# Patient Record
Sex: Female | Born: 1950 | Race: White | Hispanic: No | Marital: Married | Smoking: Current every day smoker
Health system: Southern US, Community
[De-identification: ages and names within clinical notes are randomized; demographics above are authoritative.]

## PROBLEM LIST (undated history)

## (undated) ENCOUNTER — Emergency Department (HOSPITAL_COMMUNITY): Payer: Medicare Other | Source: Home / Self Care

## (undated) DIAGNOSIS — F5089 Other specified eating disorder: Secondary | ICD-10-CM

## (undated) DIAGNOSIS — E785 Hyperlipidemia, unspecified: Secondary | ICD-10-CM

## (undated) DIAGNOSIS — M21372 Foot drop, left foot: Secondary | ICD-10-CM

## (undated) DIAGNOSIS — C801 Malignant (primary) neoplasm, unspecified: Secondary | ICD-10-CM

## (undated) DIAGNOSIS — M1990 Unspecified osteoarthritis, unspecified site: Secondary | ICD-10-CM

## (undated) DIAGNOSIS — I10 Essential (primary) hypertension: Secondary | ICD-10-CM

## (undated) DIAGNOSIS — J449 Chronic obstructive pulmonary disease, unspecified: Secondary | ICD-10-CM

## (undated) DIAGNOSIS — F329 Major depressive disorder, single episode, unspecified: Secondary | ICD-10-CM

## (undated) DIAGNOSIS — G629 Polyneuropathy, unspecified: Secondary | ICD-10-CM

## (undated) DIAGNOSIS — M109 Gout, unspecified: Secondary | ICD-10-CM

## (undated) DIAGNOSIS — G5702 Lesion of sciatic nerve, left lower limb: Secondary | ICD-10-CM

## (undated) DIAGNOSIS — Z720 Tobacco use: Secondary | ICD-10-CM

## (undated) DIAGNOSIS — M5090 Cervical disc disorder, unspecified, unspecified cervical region: Secondary | ICD-10-CM

## (undated) DIAGNOSIS — F32A Depression, unspecified: Secondary | ICD-10-CM

## (undated) DIAGNOSIS — I2510 Atherosclerotic heart disease of native coronary artery without angina pectoris: Secondary | ICD-10-CM

## (undated) HISTORY — DX: Atherosclerotic heart disease of native coronary artery without angina pectoris: I25.10

## (undated) HISTORY — PX: ABDOMINAL HYSTERECTOMY: SHX81

## (undated) HISTORY — DX: Unspecified osteoarthritis, unspecified site: M19.90

## (undated) HISTORY — DX: Foot drop, left foot: M21.372

## (undated) HISTORY — DX: Other specified eating disorder: F50.89

## (undated) HISTORY — PX: BLADDER SURGERY: SHX569

## (undated) HISTORY — DX: Polyneuropathy, unspecified: G62.9

## (undated) HISTORY — DX: Gout, unspecified: M10.9

## (undated) HISTORY — DX: Tobacco use: Z72.0

## (undated) HISTORY — DX: Major depressive disorder, single episode, unspecified: F32.9

## (undated) HISTORY — PX: LOBECTOMY: SHX5089

## (undated) HISTORY — DX: Hyperlipidemia, unspecified: E78.5

## (undated) HISTORY — DX: Cervical disc disorder, unspecified, unspecified cervical region: M50.90

## (undated) HISTORY — DX: Chronic obstructive pulmonary disease, unspecified: J44.9

## (undated) HISTORY — DX: Depression, unspecified: F32.A

## (undated) HISTORY — DX: Essential (primary) hypertension: I10

## (undated) HISTORY — PX: CHOLECYSTECTOMY: SHX55

---
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
## 1997-08-31 ENCOUNTER — Other Ambulatory Visit: Admission: RE | Admit: 1997-08-31 | Discharge: 1997-08-31 | Payer: Self-pay | Admitting: Obstetrics and Gynecology

## 1997-10-06 ENCOUNTER — Emergency Department (HOSPITAL_COMMUNITY): Admission: EM | Admit: 1997-10-06 | Discharge: 1997-10-06 | Payer: Self-pay

## 1998-03-12 ENCOUNTER — Observation Stay (HOSPITAL_COMMUNITY): Admission: AD | Admit: 1998-03-12 | Discharge: 1998-03-13 | Payer: Self-pay | Admitting: Cardiology

## 1998-08-08 ENCOUNTER — Other Ambulatory Visit: Admission: RE | Admit: 1998-08-08 | Discharge: 1998-08-08 | Payer: Self-pay | Admitting: Obstetrics and Gynecology

## 1998-08-28 ENCOUNTER — Ambulatory Visit (HOSPITAL_COMMUNITY): Admission: RE | Admit: 1998-08-28 | Discharge: 1998-08-28 | Payer: Self-pay | Admitting: Gastroenterology

## 1998-09-20 ENCOUNTER — Encounter: Payer: Self-pay | Admitting: Cardiology

## 1998-09-20 ENCOUNTER — Inpatient Hospital Stay (HOSPITAL_COMMUNITY): Admission: EM | Admit: 1998-09-20 | Discharge: 1998-09-25 | Payer: Self-pay | Admitting: Emergency Medicine

## 1998-09-24 ENCOUNTER — Encounter: Payer: Self-pay | Admitting: Cardiology

## 1998-11-01 ENCOUNTER — Ambulatory Visit (HOSPITAL_COMMUNITY): Admission: RE | Admit: 1998-11-01 | Discharge: 1998-11-01 | Payer: Self-pay | Admitting: Neurosurgery

## 1998-11-01 ENCOUNTER — Encounter: Payer: Self-pay | Admitting: Neurosurgery

## 1998-11-26 ENCOUNTER — Inpatient Hospital Stay (HOSPITAL_COMMUNITY): Admission: RE | Admit: 1998-11-26 | Discharge: 1998-11-27 | Payer: Self-pay | Admitting: Obstetrics and Gynecology

## 1998-11-26 ENCOUNTER — Encounter (INDEPENDENT_AMBULATORY_CARE_PROVIDER_SITE_OTHER): Payer: Self-pay | Admitting: Specialist

## 1999-12-23 ENCOUNTER — Encounter: Admission: RE | Admit: 1999-12-23 | Discharge: 2000-03-22 | Payer: Self-pay | Admitting: Anesthesiology

## 1999-12-24 ENCOUNTER — Encounter: Admission: RE | Admit: 1999-12-24 | Discharge: 2000-03-23 | Payer: Self-pay | Admitting: Anesthesiology

## 2000-01-28 ENCOUNTER — Ambulatory Visit (HOSPITAL_COMMUNITY): Admission: RE | Admit: 2000-01-28 | Discharge: 2000-01-28 | Payer: Self-pay | Admitting: Obstetrics and Gynecology

## 2000-01-29 ENCOUNTER — Encounter: Payer: Self-pay | Admitting: Obstetrics and Gynecology

## 2000-04-08 ENCOUNTER — Encounter: Admission: RE | Admit: 2000-04-08 | Discharge: 2000-07-07 | Payer: Self-pay | Admitting: Anesthesiology

## 2000-07-06 ENCOUNTER — Encounter: Admission: RE | Admit: 2000-07-06 | Discharge: 2000-10-04 | Payer: Self-pay | Admitting: Anesthesiology

## 2000-10-12 ENCOUNTER — Encounter: Admission: RE | Admit: 2000-10-12 | Discharge: 2000-11-27 | Payer: Self-pay | Admitting: Anesthesiology

## 2001-01-06 ENCOUNTER — Ambulatory Visit (HOSPITAL_COMMUNITY): Admission: RE | Admit: 2001-01-06 | Discharge: 2001-01-07 | Payer: Self-pay | Admitting: Cardiology

## 2001-01-06 HISTORY — PX: CARDIAC CATHETERIZATION: SHX172

## 2001-03-24 ENCOUNTER — Ambulatory Visit (HOSPITAL_COMMUNITY): Admission: RE | Admit: 2001-03-24 | Discharge: 2001-03-24 | Payer: Self-pay | Admitting: Cardiology

## 2001-03-24 HISTORY — PX: CARDIAC CATHETERIZATION: SHX172

## 2001-04-29 ENCOUNTER — Ambulatory Visit (HOSPITAL_COMMUNITY): Admission: RE | Admit: 2001-04-29 | Discharge: 2001-04-29 | Payer: Self-pay | Admitting: Gastroenterology

## 2001-05-11 ENCOUNTER — Ambulatory Visit (HOSPITAL_COMMUNITY): Admission: RE | Admit: 2001-05-11 | Discharge: 2001-05-11 | Payer: Self-pay | Admitting: Gastroenterology

## 2001-05-11 ENCOUNTER — Encounter: Payer: Self-pay | Admitting: Gastroenterology

## 2001-05-16 ENCOUNTER — Encounter: Payer: Self-pay | Admitting: Family Medicine

## 2001-05-16 ENCOUNTER — Encounter: Admission: RE | Admit: 2001-05-16 | Discharge: 2001-05-16 | Payer: Self-pay | Admitting: Family Medicine

## 2001-05-17 ENCOUNTER — Encounter: Payer: Self-pay | Admitting: Gastroenterology

## 2001-05-17 ENCOUNTER — Ambulatory Visit (HOSPITAL_COMMUNITY): Admission: RE | Admit: 2001-05-17 | Discharge: 2001-05-17 | Payer: Self-pay | Admitting: Gastroenterology

## 2001-06-07 ENCOUNTER — Encounter: Payer: Self-pay | Admitting: Surgery

## 2001-06-14 ENCOUNTER — Encounter (INDEPENDENT_AMBULATORY_CARE_PROVIDER_SITE_OTHER): Payer: Self-pay

## 2001-06-14 ENCOUNTER — Observation Stay (HOSPITAL_COMMUNITY): Admission: RE | Admit: 2001-06-14 | Discharge: 2001-06-15 | Payer: Self-pay | Admitting: Surgery

## 2002-06-14 ENCOUNTER — Inpatient Hospital Stay (HOSPITAL_COMMUNITY): Admission: AD | Admit: 2002-06-14 | Discharge: 2002-06-17 | Payer: Self-pay

## 2002-06-14 ENCOUNTER — Encounter: Payer: Self-pay | Admitting: Cardiology

## 2002-08-21 ENCOUNTER — Encounter: Admission: RE | Admit: 2002-08-21 | Discharge: 2002-11-19 | Payer: Self-pay | Admitting: Family Medicine

## 2003-01-31 ENCOUNTER — Encounter: Admission: RE | Admit: 2003-01-31 | Discharge: 2003-01-31 | Payer: Self-pay | Admitting: Family Medicine

## 2004-04-14 ENCOUNTER — Encounter (INDEPENDENT_AMBULATORY_CARE_PROVIDER_SITE_OTHER): Payer: Self-pay | Admitting: Specialist

## 2004-04-14 ENCOUNTER — Ambulatory Visit (HOSPITAL_COMMUNITY): Admission: RE | Admit: 2004-04-14 | Discharge: 2004-04-14 | Payer: Self-pay | Admitting: Gastroenterology

## 2006-12-15 ENCOUNTER — Emergency Department (HOSPITAL_COMMUNITY): Admission: EM | Admit: 2006-12-15 | Discharge: 2006-12-15 | Payer: Self-pay | Admitting: Emergency Medicine

## 2008-05-09 ENCOUNTER — Emergency Department (HOSPITAL_COMMUNITY): Admission: EM | Admit: 2008-05-09 | Discharge: 2008-05-09 | Payer: Self-pay | Admitting: Emergency Medicine

## 2008-09-24 ENCOUNTER — Encounter: Admission: RE | Admit: 2008-09-24 | Discharge: 2008-09-24 | Payer: Self-pay | Admitting: Family Medicine

## 2009-03-30 HISTORY — PX: CORONARY STENT PLACEMENT: SHX1402

## 2009-04-05 ENCOUNTER — Encounter: Admission: RE | Admit: 2009-04-05 | Discharge: 2009-04-05 | Payer: Self-pay | Admitting: Cardiology

## 2009-04-09 ENCOUNTER — Ambulatory Visit (HOSPITAL_COMMUNITY): Admission: AD | Admit: 2009-04-09 | Discharge: 2009-04-10 | Payer: Self-pay | Admitting: Cardiology

## 2009-04-09 HISTORY — PX: CARDIAC CATHETERIZATION: SHX172

## 2009-09-13 ENCOUNTER — Encounter: Admission: RE | Admit: 2009-09-13 | Discharge: 2009-09-13 | Payer: Self-pay | Admitting: Family Medicine

## 2009-09-18 ENCOUNTER — Encounter: Admission: RE | Admit: 2009-09-18 | Discharge: 2009-09-18 | Payer: Self-pay | Admitting: Family Medicine

## 2010-04-14 ENCOUNTER — Ambulatory Visit: Payer: Self-pay | Admitting: Cardiology

## 2010-04-20 ENCOUNTER — Encounter: Payer: Self-pay | Admitting: Family Medicine

## 2010-06-15 LAB — CBC
HCT: 38.6 % (ref 36.0–46.0)
Hemoglobin: 13.1 g/dL (ref 12.0–15.0)
MCHC: 34.1 g/dL (ref 30.0–36.0)
MCV: 96.8 fL (ref 78.0–100.0)
Platelets: 219 10*3/uL (ref 150–400)
RBC: 3.99 MIL/uL (ref 3.87–5.11)
RDW: 14.1 % (ref 11.5–15.5)
WBC: 7.6 10*3/uL (ref 4.0–10.5)

## 2010-06-15 LAB — BASIC METABOLIC PANEL
BUN: 24 mg/dL — ABNORMAL HIGH (ref 6–23)
CO2: 29 mEq/L (ref 19–32)
Calcium: 9.1 mg/dL (ref 8.4–10.5)
Chloride: 96 mEq/L (ref 96–112)
Creatinine, Ser: 1.13 mg/dL (ref 0.4–1.2)
GFR calc Af Amer: 60 mL/min — ABNORMAL LOW (ref >60)
GFR calc non Af Amer: 49 mL/min — ABNORMAL LOW (ref >60)
Glucose, Bld: 92 mg/dL (ref 70–99)
Potassium: 4.1 mEq/L (ref 3.5–5.1)
Sodium: 133 mEq/L — ABNORMAL LOW (ref 135–145)

## 2010-06-15 LAB — GLUCOSE, CAPILLARY: Glucose-Capillary: 95 mg/dL (ref 70–99)

## 2010-08-15 NOTE — Op Note (Signed)
[facility]  Patient:    Maria Arroyo, Maria Arroyo                   MRN: [id number] Proc. Date: 12/24/99 Adm. Date:  [id number] Attending:  Thyra Breed CC:         Talmadge Coventry, M.D.  Julio Sicks, M.D.   Operative Report  NEW PATIENT EVALUATION:  HISTORY OF PRESENT ILLNESS:  Maria Arroyo is a very pleasant 60 year old who is sent to [place] Julio Sicks, M.D., and Talmadge Coventry, M.D., for evaluation of her low back problems.  The patient has a history of back problems which she dates back to about 10-12 years ago.  She stated that she developed the sudden onset of lower back discomfort, which she tolerated and lived with for approximately five to six years before seeking medical attention.  During that time, she had pain that radiated out to the left lower extremity with associated numbness and tingling.  She saw Ronaldo Miyamoto L. Cabbell, M.D., about five years ago, at which time she apparently had a left L4-5 disk herniation with free fragment compressing the L4 nerve root.  She underwent surgical intervention, but apparently the disk herniation was not localized.  She awoke with the same pain as prior to the surgery.  Prior to the surgery, she had undergone what sounds like three caudal epidural steroid injections with no improvement.  She continued to persist with her symptoms and was seen by Dr. Smith Mince in the interim. She sent the patient to see Dr. Jordan Likes in August 2000, at which time a repeat MRI was performed, which demonstrated facet joint arthritis with central disk bulging especially at 3-4, causing some mild spinal canal narrowing with patent foramina and L4-5 broad-based bulge into the lateral recesses associated with bilateral facet joint arthropathy and narrowing of the neural foramen.  L5-S1 appeared normal.  She was also noted to have enlargement of her ovarian tissues.  She has subsequently undergone a hysterectomy by Dr. Pennie Rushing.  She  continued to have the pain, which she described as a constant achy, pressure-like discomfort in her back, predominantly to the left side and radiating out to the left leg.  More recently this has been associated with right lower extremity lateral thigh discomfort.  She has numbness when she walks for a distance, which does not improve necessarily when she sits down. She complains of some intermittent weakness of the left lower extremity but denied any bowel or bladder incontinence.  She could not identify any exacerbating or relieving features to this discomfort.  The patient also complains of left upper extremity numbness and tingling and left facial numbness and tingling, which was present back when she saw Dr. Jordan Likes back in August 2000.  The patient states that she feels as though this is getting more frequent.  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  MEDICATIONS:  Current medications are Prevacid, Zoloft, Klonopin, metoprolol, Lipitor, and enteric-coated aspirin.  ALLERGIES:  The patient has nausea and vomiting to CODEINE.  FAMILY HISTORY:  Positive for cancer, coronary artery disease, diabetes, strokes, and hypertension.  SOCIAL HISTORY:  The patient is a two pack per day smoker.  She does not drink alcohol.  She works as a Conservation officer, nature and sometimes as a Comptroller.  ACTIVE MEDICAL PROBLEMS:  Coronary artery disease, depression, and gastroesophageal reflux disease, as well as a history of asthma.  REVIEW OF SYSTEMS:  GENERAL:  Negative.  HEENT:  Head negative.  Eyes negative.  Nose, mouth, throat negative.  Ears significant for recurrent ear infections.  LUNGS:  Significant for history of asthma.  She is a two pack per day smoker.  CARDIOVASCULAR:  See active medical problems.  GASTROINTESTINAL: Positive for gastroesophageal reflux disease and constipation.  GENITOURINARY: Negative.  MUSCULOSKELETAL:   Significant for left knee pain, which may be pain emanating from her back.  NEUROLOGIC:  See HPI for pertinent positives. CUTANEOUS:  Negative.  HEMATOLOGIC:  Negative.  ENDOCRINE:  Negative. PSYCHIATRIC:  See active medical problems.  The patient does have some reactive depression to taking care of her parents for several years following strokes and an aneurysm repair.  ALLERGY/IMMUNOLOGIC:  Negative.  PHYSICAL EXAMINATION:  VITAL SIGNS:  Blood pressure 139/69, heart rate 56, respiratory rate 16, O2 saturation 97%, pain level is 10 out of 10, and temperature is 97 degrees.  GENERAL:  This is a very pleasant, frustrated female in no acute distress.  HEENT:  Head was normocephalic, atraumatic.  Eyes:  Extraocular movements intact with conjunctivae and sclerae clear.  Nose:  Patent nares.  Oropharynx demonstrated upper dental plate.  NECK:  Very good range of motion with negative Spurlings sign.  Carotids are 2+ and symmetric without bruits.  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  LUNGS:  Clear with accentuated dorsal kyphosis.  HEART:  Regular rate and rhythm.  BREASTS, ABDOMEN, PELVIC, RECTAL:  Not performed.  BACK:  Increased pain on hyperextension to 30 degrees, especially over the left lumbar facet joint regions.  Forward flexion to about 40 degrees increased her discomfort.  Straight leg raise signs were negative.  Gait was intact.  EXTREMITIES:  No cyanosis, clubbing, or edema.  The radial pulses and dorsalis pedis pulses were 2+ and symmetric.  NEUROLOGIC:  The patient was oriented x 4.  Cranial nerves II-XII are grossly intact.  Deep tendon reflexes were symmetric in the upper and lower extremities with downgoing toes.  Motor was 5/5 with symmetric bulk and tone. Sensory was intact to pin scratch and vibratory sense.  Coordination was intact to finger-to-nose.  IMPRESSION: 1. Low back pain with radiation predominantly into the left lower  extremity,     with underlying degenerative disk disease and facet joint arthropathy,    predominantly localized to L3-4, L4-5, and L2-3, with question of neural    foraminal stenosis at 4-5 bilaterally. 2. Left upper extremity discomfort with history of cerebellar pontine angle    arachnoid cyst.  Rule out possible progression in the cyst. 3. Coronary artery disease per Dr. Clarene Duke. 4. Depression per Dr. Smith Mince. 5. Gastroesophageal reflux disease per Dr. Smith Mince. 6. Cigarette abuse and history of asthma.  DISPOSITION: 1. I advised the patient of her likely diagnosis and discussed treatment    options, including chronic sustained opiates, treatments in a    time-contingent manner, possibly in combination with facet joint nerve    blocks.  She is interested in pursuing this.  She does have a history of a    lot of nausea when she uses codeine, and I advised her that we would have    to proceed very cautiously with regard to this.  We will go ahead and start    her on OxyContin 10 mg one p.o. q.p.m. x 7 days, and if she tolerates this    go to one twice a day.  I plan to see her back  in four weeks to reassess. 2. I have encouraged her to follow up with Dr. Smith Mince with regard to the    left-sided facial numbness and left upper extremity numbness. 3. Continue on other medications per Drs. Little and Mazzocchi. 4. I advised her that she may benefit from speaking with Dr. Lodema Hong, our    psychologist, to find out whether she could improve her pain coping skills.    She is very open to this and wants to think about this.  In addition, I strongly impressed upon her the need to stop smoking if at all possible.  She plans to seriously consider this. DD:  12/24/99 TD:  12/24/99 Job: [id number]

## 2010-08-15 NOTE — Discharge Summary (Signed)
NAME:  Maria Arroyo, Maria Arroyo                      ACCOUNT NO.:  [id number]   MEDICAL RECORD NO.:  [id number]                   PATIENT TYPE:  INP   LOCATION:  5531                                 FACILITY:  [facility]   PHYSICIAN:  Christella Noa, M.D.                  DATE OF BIRTH:  1950/12/31   DATE OF ADMISSION:  06/14/2002  DATE OF DISCHARGE:  06/17/2002                                 DISCHARGE SUMMARY   PRIMARY CARE PHYSICIAN:  Christella Noa, M.D.   DISCHARGE DIAGNOSES:  1. Acute chronic obstructive pulmonary disease exacerbation with symptoms     consistent with acute bronchitis.  2. Non-cardiac chest pain, secondary to number one.  3. Obesity.  4. Newly-diagnosed clinical depression.  5. Hypertension.  6. Hypercholesterolemia.  7. Chronic low back pain with a herniated disk, followed by [facility] Lor[facility]he L.     [facility]  8. History of bladder surgery.  9. Status post hysterectomy.  10.      Ongoing tobacco abuse.  11.      Coronary artery disease     a. Stent in the proximal right coronary artery in October 2002.     b. Mid-right coronary artery stent in 1999.     c. Angioplasty of the distal right coronary artery.     d. Followed by Dr. Peter M. [place], cardiology.   DISCHARGE MEDICATIONS:  1. Tequin 400 mg daily for six days, then stop.  2. Clonazepam 2 mg t.i.d.  3. Combivent inhaler two puffs q.i.d.  4. Demadex 20 mg daily.  5. Potassium chloride 20 mEq daily.  6. Aspirin 81 mg daily.  7. Advair - Hold until followed up by primary care physician.  8. Pravachol 40 mg q.h.s.  9. Tri-Chlor 160 mg p.o. daily.  10.      Soma 350 mg q.i.d. p.r.n.  11.      Prednisone 20 mg - two tab, b.i.d. on March 21st and March 22nd,     two tab daily on March 23rd and March 24th, one tab daily on March 25th     and March 26th, 1/2 tab daily on March 27th and March 28th, and then     stop.  12.      Wellbutrin XL 150 mg, one q.a.m.   FOLLOW UP:  The patient is instructed  to call Dr. Excell Seltzer for a followup in  approximately 10-14 days.  At that time an evaluation of the patient's BUN  and creatinine, as well as potassium will be appropriate, given her  concomitant diuretic potassium therapy.  Furthermore, the physical  examination should focus on pulmonary exam and possible wheezing.  Formal  PFTs could be considered in an outpatient setting to formally diagnose  chronic obstructive pulmonary disease.  Her clinical history is consistent  with such.  The patient should also be assessed for her adherence to tobacco  abstinence.  CONSULTATIONS:  Dr. [place] with cardiology/Dr. Colleen Can. Tennant.   PROCEDURE:  None.   HISTORY OF PRESENT ILLNESS:  The patient is a 60 year old female who was  followed in primary care by Dr. Excell Seltzer, who presented to the hospital on the  day of the admission with complaints of a two-day history of shortness of  breath, accompanied by chest tightness and a sensation of her lungs burning.  She had been unable to expectorate and produce significant sputum.  She  reported feeling a subjective fever and chills on the day of admission.  The  patient reported being severely weak.  The patient was admitted for  evaluation of her chest pain.   HOSPITAL COURSE:  #1 - CHEST PAIN, NON-CARDIAC:  The patient was admitted to  the hospital and followed on telemetry.  She ruled out for a myocardial  infarction with serial cardiac enzymes.  The patient's cardiology service  was consulted, and Dr. Deborah Chalk presented on behalf of Dr. [place].  Dr.  Deborah Chalk felt that his review of the electrocardiograms and the cardiac  enzymes was most consistent with chest tightness, related to her chronic  obstructive pulmonary disease.  This was likely non-cardiac in nature.  After the patient did rule out for a myocardial infarction and had no  further complaints of chest pain, her topical nitrate and Lovenox which had  been initiated at admission were  discontinued.  Throughout the remainder of  the hospitalization, the patient had no recurrent chest pain.  It is  recommended that she proceed with the routine followup with her cardiologist  on an as-needed basis.  #2 - CHRONIC OBSTRUCTIVE PULMONARY DISEASE:  At the time of admission the  patient's complaints were consistent with a possible acute bronchospasm and  bronchitis.  Evaluation on the second day of hospitalization revealed that  wheezes were apparent.  These were not initially apparent, and these were  likely secondary to a severe bronchospasm.  The patient was treated with IV  Decadron, frequent nebulizer therapy, and empiric antibiotics for probable  acute bronchitis.  She tolerated these well.  At the time of discharge she  has been discharged on Combivent q.i.d. on a standing basis, with a tapering  dose of prednisone, and to complete a full 10-day course of Tequin as noted  above.  It is recommended in the outpatient setting that PFTs be considered.  Nonetheless, given the clinical situation and the patient's longstanding  history of tobacco abuse, it was clear that this patient has emphysema.  PFTs could be helpful, however, in quantifying the severity.  At the time of  discharge, the patient's O2 saturation is 96% on room air.  She did require  supplemental oxygen during the initial portion of the hospitalization.  #3 -TOBACCO ABUSE:  The patient has a longstanding history of significant  tobacco abuse.  She has attempted to quit in the past, but was unsuccessful.  This is also related to a significant amount of depression related to the  death of her mother.  A smoking cessation consultation was obtained during  this hospitalization.  The patient did well without smoking during this  hospitalization and was encouraged in such.  Multiple consultations with  physicians in smoking cessation were carried out.  The patient is highly motivated to discontinue smoking at the time  of discharge.  She is being  placed on Wellbutrin for her clinical depression, but it is hopeful that  this will also aid her in smoking cessation.  She is also advised to use  over-the-counter nicotine patches to aid her in smoking cessation.  #4 - CLINICAL DEPRESSION:  During this hospitalization the patient had  multiple conversations with her physician in which she became tearful.  She  reported that she is having difficulty sleeping at night, and had difficulty  dealing with the death of her mother.  At her request, she was placed on  Wellbutrin XL in attempt to treat her clinical depression.  This should be  followed and increase titration of her dose will likely be necessary.  #5 - KNOWN HISTORY OF CORONARY ARTERY DISEASE WITH MULTIPLE STENTS:  The  patient should  receive ongoing routine followup with her cardiologist for continuous  evaluation of her cardiac stents.  Nonetheless, this presentation was not  consistent with true angina, and cardiology did not feel that further  evaluation was necessary at this time.     Lonia Blood, M.D.                  Christella Noa, M.D.    JTM/MEDQ  D:  06/17/2002  T:  06/19/2002  Job:  [id number]   cc:   Christella Noa, M.D.  [address] [place]  [place], [place] [address]  Fax: [phone]

## 2010-08-15 NOTE — H&P (Signed)
. [facility]  Patient:    Maria Arroyo, Maria Arroyo Visit Number: [id number] MRN: [id number]          Service Type: Attending:  Peter M. [place], M.D. Dictated by:   Peter M. [place], M.D. Adm. Date:  01/06/01   CC:         Talmadge Coventry, M.D.   History and Physical  CHIEF COMPLAINT:  Chest pain.  HISTORY OF PRESENT ILLNESS:  Maria Arroyo is a 60 year old white female with multiple cardiac risk factors and known history of coronary artery disease, who is seen for evaluation of refractory chest pain.  The patient reports that she has had chronic mid substernal chest pain radiating to her left chest associated with shortness of breath.  Her pain is constant, but does wax and wane in intensity.  Seems to get worse if she gets upset.  It does not appear to be associated with meals or activity.  She does not take nitroglycerin.  The patient has known history of coronary artery disease and status post stenting in the mid right coronary artery in December 1999 by Dr. Clarene Duke.  A 3.0 x 25 mm NIR Primo stent was placed at that time in the mid right coronary artery.  She also had angioplasty of distal right coronary artery.  Repeat cardiac catheterization in June 2000 showed a 70% stenosis in the right coronary artery prior to the stent.  It was also noted she had an 80% stenosis in the diagonal branch.  These lesions were treated medically.  She has no known history of myocardial infarction.  PAST MEDICAL HISTORY:  1. Hypertension.  2. Hypercholesterolemia.  3. Chronic low back pain due to herniated disk and is being treated by Dr.     Vear Clock.  4. Prior bladder surgery.  5. Hysterectomy.  ALLERGIES:  No known drug allergies.  CURRENT MEDICATIONS:  1. Zoloft 100 mg q.d.  2. Aspirin q.d.  3. Clonazepam 1 mg in the morning and 2 mg q.h.s.  4. Prevacid 30 mg b.i.d.  5. Lipitor 80 mg q.d.  6. Elavil 25 mg q.d.  7. Estradiol 2 mg q.d.  8. Advair 1  puff q.h.s.  9. Coreg 6.25 mg q.d. 10. OxyContin 40 mg b.i.d. 11. Lasix 40 mg q.d. 12. Carisoprodol 350 mg q.i.d. 13. Stool softener q.d. 14. Vitamin E and B q.d.  SOCIAL HISTORY:  The patient is a housewife.  She smokes 1/2 pack-per-day and previously had been 2-3 pack-per-day smoker for 30 years.  She does not drink alcohol.  She is married and has two children.  FAMILY HISTORY:  Father died at age 55 of stroke and he also had a history of myocardial infarction and congestive heart failure.  Mother died at age 52 of brain aneurysm.  One brother died at age 62 of myocardial infarction.  REVIEW OF SYSTEMS:  The patient states she hurts all over.  She does not exercise.  She has had some difficulty urinating, but no burning or fever.  No change bowel habits.  All other review of systems are negative.  PHYSICAL EXAMINATION:  VITAL SIGNS:  Blood pressure 98/60, pulse 60 and regular.  GENERAL:  The patient is an obese, white female in no apparent distress.  HEENT:  Pupils equal, round, and reactive.  Oropharynx is clear.  NECK:  Supple without JVD, adenopathy, thyromegaly, or bruits.  LUNGS:  Clear.  HEART:  Regular rate and rhythm without gallops, murmurs, rubs, or clicks.  ABDOMEN:  Soft, obese,  nontender.  There are no masses or bruits.  There is no hepatosplenomegaly.  EXTREMITIES:  Femoral and pedal pulses are 2+ and symmetric.  She has no edema.  Back is unremarkable.  GU/RECTAL:  Exams deferred.  NEUROLOGIC:  The patient was alert and oriented x 3.  Cranial nerves II-XII intact.  Normal motor exam.  SKIN:  Deeply tanned.  X-RAYS:  ECG shows normal sinus rhythm with nonspecific T-wave abnormality. Chest x-ray shows borderline heart size, otherwise no active disease.  LABORATORY:  Recent chemistry panel:  CBC normal, TSH normal.  Lipid panel showed triglycerides 466, total cholesterol 214, HDL 37, and LDL could not be calculated.  IMPRESSION:  1. Chronic chest  pain.  2. Atherosclerotic coronary artery disease status post stenting of mid right     coronary artery and angioplasty of distal right coronary.  3. Combined hyperlipidemia.  4. Obesity.  5. Tobacco abuse.  6. Hypertension.  PLAN:  The patient will be admitted for cardiac catheterization and further therapy pending these results. Dictated by:   Peter M. [place], M.D. Attending:  Peter M. [place], M.D. DD:  01/04/01 TD:  01/04/01 Job: [id number]

## 2010-08-15 NOTE — Op Note (Signed)
[facility]  Patient:    [name], [name] Visit Number: [id number] MRN: [id number]          Service Type: SUR Location: 4W [id number] [id number] Attending Physician:  Shelly Rubenstein Dictated by:   Abigail Miyamoto, M.D. Proc. Date: 06/14/01 Admit Date:  06/14/2001   CC:         Anselmo Rod, M.D.   Operative Report  PREOPERATIVE DIAGNOSES: 1. Abdominal pain of uncertain etiology. 2. Biliary dyskinesia.  POSTOPERATIVE DIAGNOSES: 1. Abdominal pain of uncertain etiology. 2. Biliary dyskinesia.  PROCEDURE:  Laparoscopic cholecystectomy.  SURGEON:  Abigail Miyamoto, M.D.  ASSISTANT:  Donnie Coffin. Samuella Cota, M.D.  ANESTHESIA:  General endotracheal.  ESTIMATED BLOOD LOSS:  Minimal.  PROCEDURE IN DETAIL:  The patient was brought to the operating room, identified as Maria Arroyo. She was placed supine on the operating room table and general anesthesia was induced. Her abdomen was then prepped and draped in the usual sterile fashion. Using a #15 blade, a small transverse incision was made below the umbilicus. This incision was carried down through the fascia which was then opened with a scalpel. A hemostat was then used to pass then used to pass into the peritoneal cavity. A 0 Vicryl pursestring suture was placed around the fascial opening. The Hasson port was then placed through the opening and insufflation of the abdomen was begun. An 11 mm port was placed in the patients epigastrium and two 5 mm ports were placed in the right flank under direct vision. The gallbladder was then identified and grasped and retracted above the liver bed. Dissection was then carried out the hilum of the gallbladder The cystic artery was found to be anterior. It was clipped twice proximally, once distally, and transected with the scissors. The cystic duct was identified, clipped three times proximally, once distally, and transected as well. The gallbladder was then removed  from the liver bed with the electrocautery. Hemostasis was then achieved in the liver bed with the cautery. The gallbladder was then grasped and removed through the umbilicus. The incision was then closed with the pursestring suture closing the fascial defect at the umbilicus. The abdomen was then irrigated with normal saline. Hemostasis appeared to be achieved. All ports were then removed under direct vision and the abdomen was deflated. All incisions were then anesthetized with 0.25% Marcaine and then closed with 4-0 Monocryl subcuticular sutures. Steri-Strips, gauze, and tape were then applied. The patient tolerated the procedure well. All sponge, needle, and instrument counts were correct at the end of the procedure. The patient was then extubated in the operating room and taken in stable condition to recovery room.  Dictated by:   Abigail Miyamoto, M.D. Attending Physician:  Shelly Rubenstein DD:  06/14/01 TD:  06/15/01 Job: [id number]

## 2010-08-15 NOTE — H&P (Signed)
[facility]  Patient:    Maria Arroyo, Maria Arroyo                   MRN: [id number] Adm. Date:  [id number] Attending:  Thyra Breed CC:         Talmadge Coventry, M.D.   History and Physical  Maria Arroyo comes in for followup evaluation of her chronic low back pain with radiation out to the left lower extremity.  Since her last evaluation she has had a marked accentuation of the pain into her left hip and lower extremity radiating out over the anterior aspect of her left knee to her left foot.  She saw Dr. Smith Mince a couple weeks ago and she has increased her Zanaflex and Klonopin but the patient has not noted a great deal of improvement overall. She is very frustrated by the discomfort.  It is the same distribution of her previous pain, but has intensified somewhat.  She does not feel as though the OxyContin is quite as helpful as it has been in the past.  She has had ______ epidural steroid injections in the past over at radiology and did not have a good response to these and we discussed lumbar epidural steroid injections today.  Her pain is made worse by minimal activity and improved by rest to a degree.  CURRENT MEDICATIONS:  1. Aspirin.  2. Vitamin E.  3. Prevacid.  4. Lipitor.  5. Zoloft 100 mg q.d.  6. Metoprolol 50 mg one-half tablet.  7. Estradiol.  8. OxyContin 20 mg t.i.d.  9. Zanaflex 2 mg in the morning, 2 mg at midday, and 4 mg at night. 10. Clonazepam 1 mg in the morning and 2 mg in the evening.  PHYSICAL EXAMINATION:  VITAL SIGNS:  Blood pressure 126/74, heart rate 75, respiratory rate 18, O2 saturation 97%, pain level 8/10.  EXTREMITIES:  Straight leg raise signs were positive on the left side.  Deep tendon reflexes were symmetric.  Motor is unchanged.  IMPRESSION: 1. Low back pain with element of L5 radiculopathy to the left lower extremity    with known lumbar spondylosis on her MRI with some spinal stenosis and some  foraminal lateral recess stenosis in the lower lumbar facette regions. 2. Other medical problems per Dr. Smith Mince.  DISPOSITION: 1. Continue on OxyContin 20 mg one p.o. q.8h. #90 with no refill. 2. Continue on other medications. 3. I advised the patient that we should seriously consider a trial of lumbar    epidural steroid injections.  We discussed potential risks, benefits, and    limitations of the procedure in detail.  I advised her that if she did not    respond to the first two there would be no need for a third and that we    should consider a repeat MRI if she does not respond.  I plan to see her    back in followup in the ensuing weeks to proceed with lumbar epidural    steroid injections. DD:  05/11/00 TD:  05/11/00 Job: [id number]

## 2010-08-15 NOTE — Consult Note (Signed)
[facility]  Patient:    Maria Arroyo, Maria Arroyo                   MRN: [id number] Proc. Date: 07/07/00 Adm. Date:  [id number] Attending:  Thyra Breed CC:         Julio Sicks, M.D.  Talmadge Coventry, M.D.   Consultation Report  FOLLOW-UP EVALUATION:  Maria Arroyo comes in for follow-up today. She is doing remarkably well. She has on pain. She is having some intermittent numbness and tingling in her left lateral thigh but overall, she has had minimal symptoms otherwise except for a bout of a kidney infection two weeks ago which responded to Cipro.  CURRENT MEDICATIONS:  Lipitor, OxyContin 20 mg three times a day, Lopressor, clonazepam, Zanaflex, Zoloft and Prevacid.  PHYSICAL EXAMINATION:  Blood pressure is 131/72, heart rate 61, respiratory rate 18, O2 saturations 96%, pain level is 0/10. Deep tendon reflexes were symmetric at the knees and ankle. Straight leg raise signs were negative today.  IMPRESSION: 1. 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 2. Other medical problems per Dr. Smith Mince.  DISPOSITION: 1. Continue on current dose of OxyContin 20 mg one p.o. q. 8h. 2. Followup with me in eight weeks. She is to let us known when she is    running low on her OxyContin. 3. She was encouraged to progressively increase her level of activities    as tolerated. She is currently exercising regularly and she feels much    better overall. DD:  07/07/00 TD:  07/07/00 Job: [id number]

## 2010-08-15 NOTE — Op Note (Signed)
Maria Arroyo, Maria Arroyo            ACCOUNT NO.:  [id number]   MEDICAL RECORD NO.:  [id number]          PATIENT TYPE:  AMB   LOCATION:  ENDO                         FACILITY:  [facility]   PHYSICIAN:  Anselmo Rod, M.D.  DATE OF BIRTH:  [birth year]   DATE OF PROCEDURE:  04/14/2004  DATE OF DISCHARGE:                                 OPERATIVE REPORT   PROCEDURE:  Colonoscopy with cold biopsies x3.   ENDOSCOPIST:  Anselmo Rod, M.D.   INSTRUMENT USED:  Olympus video colonoscope.   INDICATIONS FOR PROCEDURE:  A 60 year old white female with a family history  of colon cancer and a personal history of colonic polyps undergoing a  screening colonoscopy to rule out colon polyps, masses, etc.   PREPROCEDURE PREPARATION:  Informed consent was procured from the patient.  The patient fasted for eight hours prior to the procedure and prepped with a  bottle of magnesium citrate and a gallon of GoLYTELY the night prior to the  procedure.   PREPROCEDURE PHYSICAL:  The patient had stable vital signs. Neck supple.  Chest clear to auscultation. S1, S2 regular. Abdomen soft with normal bowel  sounds.   DESCRIPTION OF PROCEDURE:  The patient was placed in the left lateral  decubitus position and sedated with 10 mg of Versed and 100 mg of Demerol in  slow incremental doses.  Once the patient was adequately sedated and  maintained on low flow oxygen and continuous cardiac monitoring, the Olympus  video colonoscope was advanced from the rectum to the cecum. The appendiceal  orifice and ileocecal valve were clearly visualized and photographed. The  patient's position was changed from the left lateral to the supine position  with gentle application of abdominal pressure to reach the cecum.  Two small  sessile polyps were biopsied from the hepatic flexure (four biopsies).  Another small sessile polyp was biopsied from the rectosigmoid colon, small  internal hemorrhoids were seen on retroflexion. The  patient tolerated the  procedure well without complications.   IMPRESSION:  1.  Two small sessile polyps biopsied from the hepatic flexure and one from      the rectosigmoid colon (cold biopsies).  2.  Small internal hemorrhoids.  3.  No masses or large polyps seen.   RECOMMENDATIONS:  1.  Await pathology results.  2.  Avoid all nonsteroidals including aspirin for the next two weeks.  3.  Repeat colonoscopy depending on pathology results.  4.  Outpatient followup as needed in the future.      Jyot   JNM/MEDQ  D:  04/14/2004  T:  04/14/2004  Job:  [id number]   cc:   Talmadge Coventry, M.D.  [address]  [place]  [place] [address]  Fax: [phone]

## 2010-08-15 NOTE — H&P (Signed)
[place]. [facility]  Patient:    Maria Arroyo, Maria Arroyo Visit Number: [id number] MRN: [id number]          Service Type: CAT Location: [id number] Attending Physician:  [place], Peter Manning Dictated by:   Peter M. [place], M.D. Admit Date:  01/06/2001 Discharge Date: 01/07/2001   CC:         Talmadge Coventry, M.D.  Thyra Breed, M.D.   History and Physical  CHIEF COMPLAINT: Ms. Panjwani is a 60 year old white female, with multiple cardiac risk factors and history of coronary artery disease.  She presents now with recurrent chest pain.  HISTORY OF PRESENT ILLNESS: The patients last coronary intervention involved stenting of the proximal right coronary artery in October 2002.  She reports complete relief of her chest pain for approximately four to six weeks, but over the last four weeks has been experiencing recurrent chest pain.  The pain is in her mid chest and left chest.  It comes and goes, and does not appear to be related to stress or activity.  She does get some relief with rest and has not take any nitroglycerin due to fear of headache.  Her prior cardiac history includes previous stenting in the mid right coronary artery in December 1999 by Dr. Clarene Duke using a 3.0 x 25 mm NIR Premo stent.  She also had angioplasty of the distal right coronary artery.  She underwent stenting of the proximal right coronary artery in October 2002 with a 3.0 x 23 mm Zeta stent.  PAST MEDICAL HISTORY:  1. Hypertension.  2. Hypercholesterolemia.  3. 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     Vear Clock.  4. Prior bladder surgery.  5. Hysterectomy.  ALLERGIES: None known.  CURRENT MEDICATIONS:  1. Zoloft 100 mg q.d.  2. Aspirin q.d.  3. Clonazepam 1 mg in the morning and 2 mg q.h.s.  4. Prevacid 30 mg b.i.d.  5. Lipitor 80 mg q.d.  6. Elavil 25 mg q.d.  7. Estradiol 2 mg q.d.  8. Advair one puff q.h.s.  9. Lasix 40 mg q.d. 10.  Carisoprodol 350 mg q.i.d. p.r.n. 11. Stool softener q.d. 12. Vitamin E q.d. 13. Vitamin B q.d. 14. Niaspan 500 mg q.h.s. 15. Multivitamin q.d. 16. Fentanyl patch.  SOCIAL HISTORY: The patient is a housewife.  She has a chronic history of smoking 1/2 pack per day and continues to smoke.  She denies alcohol use.  She is married and has two children.  FAMILY HISTORY: Father died at age 58 with a CVA.  He also had had a myocardial infarction and congestive heart failure.  Mother died at age 27 with a brain aneurysm.  One brother died at age 53 with myocardial infarction.  REVIEW OF SYSTEMS: Otherwise unremarkable.  She has chronic back and leg pain.  PHYSICAL EXAMINATION:  GENERAL: The patient is a pleasant white female, in no distress.  VITAL SIGNS: Blood pressure 130/88, pulse 62 and regular.  Weight 192 pounds. Respirations normal.  HEENT: PERRLA.  EOMI.  Oropharynx clear.  NECK: Without JVD, adenopathy, thyromegaly, or bruits.  LUNGS: Clear.  CARDIAC: Regular rate and rhythm without gallops, murmurs, or clicks.  ABDOMEN: Soft, nontender.  No masses or hepatosplenomegaly.  EXTREMITIES: Femoral and pedal pulses are 2+ and symmetric.  No edema.  BACK: Unremarkable.  NEUROLOGIC: Alert and oriented x 3.  Cranial nerves 2-12 intact.  Normal motor examination.  LABORATORY DATA: ECG shows normal sinus rhythm, nonspecific T  wave abnormality.  IMPRESSION:  1. Chest pain with atypical features.  Similar to prior anginal symptoms.     Need to rule out restenosis.  2. Status post stenting of right coronary artery.  3. Tobacco abuse.  4. Combined hyperlipidemia.  5. Obesity.  6. Hypertension.  7. Family history of early coronary disease.  PLAN: The patient is being admitted for repeat cardiac catheterization, with further therapy pending these results. Dictated by:   Peter M. [place], M.D. Attending Physician:  [place], Peter Manning DD:  03/17/01 TD:  03/18/01 Job:  [id number]

## 2010-08-15 NOTE — H&P (Signed)
[facility]  Patient:    Maria Arroyo, Maria Arroyo                   MRN: [id number] Adm. Date:  [id number] Attending:  Thyra Breed CC:         Talmadge Coventry, M.D.   History and Physical  FOLLOW-UP EVALUATION  Kinsey comes in for follow-up evaluation of her low back pain on the basis of lumbar spondylosis.  Since her last evaluation she has noted modest improvement on her current medical regimen of Zanaflex and OxyContin.  She is taking the OxyContin and Zanaflex one twice a day.  She has not pushed up the Zanaflex.  She is tolerating it well, but she continues to have a lot of sleep disruptions.  She ran out of her OxyContin over the holidays and went to a [facility] and got eight tablets.  PHYSICAL EXAMINATION:  VITAL SIGNS:  Blood pressure 142/69, heart rate 68, respiratory rate 18, O2 saturations 97%, pain level is 5/10.  NEUROLOGIC:  Deep tendon reflexes were symmetric in the lower extremities with negative straight leg raise signs.  IMPRESSION: 1. Low back pain on the basis of lumbar spondylosis. 2. Other medical problems per Dr. Smith Mince.  DISPOSITION: 1. Increase OxyContin to 20 mg one p.o. q.8h., #90 with no refills. 2. Increase dose of Zanaflex to 2 mg one p.o. q.8h., #100 with two refills. 3. Follow up with me in four weeks.  Patient was encouraged not to allow herself to run out of the OxyContin. DD:  04/08/00 TD:  04/08/00 Job: [id number]

## 2010-08-15 NOTE — Procedures (Signed)
[facility]  Patient:    Maria Arroyo, Maria Arroyo                   MRN: [id number] Proc. Date: 10/13/00 Adm. Date:  [id number] Attending:  Thyra Breed CC:         Julio Sicks, M.D.  Talmadge Coventry, M.D.   Procedure Report  PROCEDURE:  Lumbar epidural steroid injection.  DIAGNOSIS:  Lumbar spondylosis with chronic radiculopathy into the left lower extremity.  INTERVAL HISTORY:  The patient has noted increasing symptoms into her left lower extremity and had a very good response to epidurals in the past.  She is interested in another series.  PHYSICAL EXAMINATION:  Blood pressure 112/51, heart rate 63, respiratory rate 20, O2 saturations 97%.  Pain level is 8/10.  Her neuro exam is unchanged from her last visit.  DESCRIPTION OF PROCEDURE:  After informed consent was obtained, the patient was placed in a sitting position and monitored.  Her back was prepped with Betadine x 3.  A skin wheal was raised at the L3-4 interspace with 1% lidocaine.  A 20 gauge Tuohy needle was introduced in the lumbar epidural space to loss of resistance to preservative-free normal saline.  The depth was 6.5 cm.  There was no CSF nor blood.  Medrol 80 mg in 8 mL of preservative-free normal saline was gently injected.  The needle was flushed and removed intact.  POSTPROCEDURE CONDITION:  Stable.  DISCHARGE INSTRUCTIONS: 1. Resume previous diet. 2. Limitations on activities per instruction sheet. 3. Continue on current medications. 4. Follow up with me in 1-2 weeks for a repeat injection. 5. The patient was complaining of constipation.  She was encouraged to stick    with the protocol for constipation for the time being. DD:  10/13/00 TD:  10/13/00 Job: [id number]

## 2010-08-15 NOTE — Procedures (Signed)
[facility]  Patient:    Maria Arroyo, Maria Arroyo                   MRN: [id number] Proc. Date: 10/27/00 Adm. Date:  [id number] Attending:  Thyra Breed CC:         Julio Sicks, M.D.  Talmadge Coventry, M.D.   Procedure Report  PROCEDURE:  Lumbar epidural steroid injection.  DIAGNOSIS:  Lumbar spondylosis with chronic radiculopathy into the left lower extremity.  INTERVAL HISTORY:  The patient has noted improvement after her second injection.  She has minimal pain today.  Her medications are unchanged from previously.  She does need a prescription for her OxyContin today.  PHYSICAL EXAMINATION:  Blood pressure 140/62, heart rate 58, respiratory rate 12, O2 saturations 98%.  Pain level is 0/10.  Her back shows good healing from previous injection site.  DESCRIPTION OF PROCEDURE:  After informed consent was obtained, the patient was placed in a sitting position and monitored.  Her back was prepped with Betadine x 3.  A skin wheal was raised at the L3-4 interspace with 1% lidocaine.  A 20 gauge Tuohy needle was introduced in the lumbar epidural space to loss of resistance to preservative-free normal saline.  There was no CSF nor blood.  The depth was 6.5 cm.  I injected 80 mg of Medrol mixed with 8 mL of preservative-free normal saline.  The needle was flushed and removed intact.  POSTPROCEDURE CONDITION:  Stable.  DISCHARGE INSTRUCTIONS: 1. Resume previous diet. 2. Limitations on activities per instruction sheet. 3. Continue on current medications with prescription written for OxyContin    20 mg 1 p.o. q.8h. #90. 4. Follow up with me in eight weeks. 5. Dr. Clarene Duke has requested that we do a rhythm strip and send it to him.  She    has apparently been taken off of her Lopressor since her heart rate was so    low last week. DD:  10/27/00 TD:  10/27/00 Job: [id number]

## 2010-08-15 NOTE — Procedures (Signed)
[facility]  Patient:    Maria Arroyo, Maria Arroyo                   MRN: [id number] Proc. Date: 10/20/00 Adm. Date:  [id number] Attending:  Thyra Breed CC:         Talmadge Coventry, M.D.  Julio Sicks, M.D.   Procedure Report  PROCEDURE:  Lumbar epidural steroid injection.  DIAGNOSIS:  Lumbar spondylosis with chronic radiculopathy into the left lower extremity.  INTERVAL HISTORY:  The patient has noted minimal response to the first injection.  Nevertheless, she wishes to proceed with the series, as she did respond last time.  It was just not as impressive as the last series.  She did get a little sick to her stomach, so to speak, but was very vague in the description of the symptoms.  PHYSICAL EXAMINATION:  Blood pressure 125/65, heart rate 58, respiratory rate 18, O2 saturations 98%.  Pain level is still elevated.  Her back shows good healing from her previous injection site.  DESCRIPTION OF PROCEDURE:  After informed consent was obtained, the patient was placed in a sitting position and monitored.  Her back was prepped with Betadine x 3.  A skin wheal was raised at the L3-4 interspace with 1% lidocaine.  A 20 gauge Tuohy needle was introduced to the lumbar epidural space to loss of resistance to preservative-free normal saline.  The depth was 6.5 cm.  There was no CSF nor blood.  A mixture of 80 mg of Medrol with 8 mL of preservative-free normal saline was gently injected.  The needle was flushed and removed intact.  POSTPROCEDURE CONDITION:  Stable.  DISCHARGE INSTRUCTIONS: 1. Resume previous diet. 2. Limitations on activities per instruction sheet. 3. Continue on current medications. 4. Follow up with me in one week for a repeat injection. DD:  10/20/00 TD:  10/20/00 Job: [id number]

## 2010-08-15 NOTE — Procedures (Signed)
[facility]  Patient:    Maria Arroyo, Maria Arroyo                   MRN: [id number] Proc. Date: 05/12/00 Adm. Date:  [id number] Attending:  Thyra Breed CC:         Talmadge Coventry, M.D.  Julio Sicks, M.D.   Procedure Report  PROCEDURE:  Lumbar epidural steroid injection.  DIAGNOSIS:  Lumbar spondylosis with epidural scarring and lumbar radiculopathy to the left lower extremity.  ANESTHESIOLOGIST:  Thyra Breed, M.D.  INTERVAL HISTORY:  The patient was seen yesterday with increased symptoms over the past two weeks, and we discussed epidurals in detail yesterday.  She is interested in trying them today.  PHYSICAL EXAMINATION:  VITAL SIGNS:  Blood pressure 121/64, heart rate 52, respiratory rate 20, O2 saturation 97%, pain level 7/10, temperature 97.7.  NEUROLOGIC:  Unchanged from yesterday.  BACK:  She has a well-healed surgical scar over her lumbar spine.  DESCRIPTION OF PROCEDURE:  After informed consent was obtained, the patient was placed in the sitting position and monitored.  Her back was prepped with Betadine x 3.  A skin wheal was raised at the L3-4 interspace with 1% lidocaine.  A 20-gauge Tuohy needle was introduced in the lumbar epidural space to loss of resistance to preservative free normal saline.  The depth was 6.5 cm.  There was no CSF nor blood.  Medrol 80 mg and 8 ml preservative free normal saline was gently injected.  The needle was flushed with preservative free normal saline and removed intact.  POSTPROCEDURE CONDITION:  Stable.  DISCHARGE INSTRUCTIONS: 1. Resume previous diet. 2. Limitation of activities per instruction sheet. 3. Continue on current medications. 4. Follow up with me in one week for repeat epidural steroid injection. DD:  05/12/00 TD:  05/12/00 Job: [id number]

## 2010-08-15 NOTE — Procedures (Signed)
[place]. [facility]  Patient:    Maria Arroyo, Maria Arroyo Visit Number: [id number] MRN: [id number]          Service Type: END Location: ENDO Attending Physician:  Charna Elizabeth Dictated by:   Anselmo Rod, M.D. Proc. Date: 04/29/01 Admit Date:  04/29/2001 Discharge Date: 04/29/2001   CC:         Talmadge Coventry, M.D.   Procedure Report  DATE OF BIRTH:  1951/02/04.  PROCEDURE:  Esophagogastroduodenoscopy.  ENDOSCOPIST:  Anselmo Rod, M.D.  INSTRUMENT USED:  Olympus video panendoscope.  INDICATION FOR PROCEDURE:  Severe epigastric pain, right upper quadrant, and retrosternal discomfort in a 60 year old white female on double-dose PPIs. Rule out peptic ulcer disease, esophagitis, gastritis, etc.  PREPROCEDURE PREPARATION:  Informed consent was procured from the patient. The patient was fasted for eight hours prior to the procedure.  PREPROCEDURE PHYSICAL:  VITAL SIGNS:  The patient had stable vital signs.  NECK:  Supple.  CHEST:  Clear to auscultation.  S1, S2 regular.  ABDOMEN:  Soft with normal bowel sounds.  Right upper quadrant epigastric tenderness on palpation with guarding.  No rebound or rigidity.  No hepatosplenomegaly.  DESCRIPTION OF PROCEDURE:  The patient was placed in the left lateral decubitus position and sedated with 100 mg of Demerol and 10 mg intravenously.  Once the patient was adequately sedate and maintained on low-flow oxygen and continuous cardiac monitoring, the Olympus video panendoscope was advanced through the mouthpiece, over the tongue, into the esophagus under direct vision.  The entire esophagus appeared normal and without lesions.  There was no evidence of esophagitis, ring, stricture, masses, or Barretts mucosa.  The scope was then advanced into the stomach.  The entire gastric mucosa appeared normal, and so did the proximal small bowel.  IMPRESSION:  Normal EGD.  RECOMMENDATIONS: 1. Proceed  with abdominal ultrasound and HIDA scan to rule out gallbladder    pathology. 2. Continue PPIs twice a day. 3. Stop smoking. 4. Avoid all nonsteroidals, including aspirin. 5. Outpatient follow-up in the next two weeks. Dictated by:   Anselmo Rod, M.D. Attending Physician:  Charna Elizabeth DD:  04/30/01 TD:  05/02/01 Job: [id number]

## 2010-08-15 NOTE — H&P (Signed)
[facility]  Patient:    Maria Arroyo, Maria Arroyo                   MRN: [id number] Adm. Date:  [id number] Attending:  Thyra Breed CC:         Julio Sicks, M.D.  Talmadge Coventry, M.D.   History and Physical  FOLLOWUP EVALUATION  HISTORY OF PRESENT ILLNESS:  The patient comes in for a followup evaluation of her chronic low back pain on the basis of lumbar spondylosis with degenerative disc disease and facet joint arthropathy and some neuroforaminal stenosis.  Since her last evaluation, she has noted some modest improvement with the OxyContin, but feels as though it is not quite holding her.  She is very concerned about how tight her muscles are getting.  She has had some Soma in the past, and is asking about the possibility of going on this.  She is not getting good rest.  She continues to have pain in her lower back which predominantly radiates out to the left lower extremity.  PHYSICAL EXAMINATION:  VITAL SIGNS:  Blood pressure 142/75, heart rate 58, respiratory rate 20, O2 saturation 95%, pain level is 10/10.  NEUROLOGIC:  Straight leg raise sign was negative today.  Deep tendon reflexes were symmetric and the knees and ankles.  Motor is 5/5.  Hyperextension of her back increases her discomfort significantly.  IMPRESSION: 1. Low back pain predominantly on the basis of lumbar spondylosis with facet    joint arthropathy. 2. Other medical problems per Dr. Smith Mince.  DISPOSITION: 1. Increase OxyContin to 20 mg one p.o. b.i.d. #60 with no refills. 2. Introduced Zanaflex 2 mg one p.o. q.d. x 7 days and one b.i.d. x 7 days and    one p.o. t.i.d. x 7 days, #100 with 3 refills.  The patient was advised of    the potential side effects of this medication in detail and questions were    answered. 3. Follow up with me in four weeks. 4. If the patient is not responding to Zanaflex we will steadily increase this    dose as tolerated.  If it is not  beneficial we will consider putting a low    dose tricyclic at night on board, which I suspect will not be a problem    with the Zoloft since it will be such a low dose.DD:  02/26/00 TD:  02/26/00 Job: [id number]

## 2010-08-15 NOTE — H&P (Signed)
[facility]  Patient:    Maria Arroyo, Maria Arroyo                   MRN: [id number] Adm. Date:  [id number] Attending:  Thyra Breed CC:         Talmadge Coventry, M.D.  Julio Sicks, M.D.   History and Physical  FOLLOWUP EVALUATION:  The patient comes in for followup evaluation of her chronic low back pain syndrome on the basis of lumbar spondylosis.  Since her last evaluation, she has done well during the day but notes at night she develops left lateral thigh pain which she describes as a burning-type discomfort.  It gets quite severe.  It goes away during the day.  She has recently had a urinary tract infection and saw Dr. Talmadge Coventry, who placed her on an antibiotic for this.  CURRENT MEDICATIONS:  Lipitor, clonazepam, Zanaflex, OxyContin 20 mg three times a day, estradiol, metoprolol, Zoloft, Prevacid, enteric-coated aspirin.  PHYSICAL EXAMINATION:  VITAL SIGNS:  Blood pressure is 116/73.  Heart rate is 66.  Respiratory rate is 18.  O2 saturation is 97%.  Pain level is 0/10 at present.  NEUROLOGIC:  She exhibits negative straight leg raise signs with symmetric deep tendon reflexes of the lower extremities.  IMPRESSION: 1. Chronic low back pain syndrome with lumbar spondylosis, question of    meralgia paresthetica versus recurrent radiculopathy into the left lower    extremity. 2. Other medical problems per Dr. Smith Mince.  DISPOSITION: 1. Continue on OxyContin 20 mg one p.o. q.8h. 2. Elavil 10 mg 1 p.o. q.p.m., #30 with 2 refills. 3. Follow up with me in eight weeks.  She was advised to let me know in four    weeks whether her hip discomfort had improved with the Elavil; if it has    not, then we will consider giving her a series of lumbar epidural steroid    injections at that time. DD:  09/01/00 TD:  09/02/00 Job: [id number]

## 2010-08-15 NOTE — Procedures (Signed)
[facility]  Patient:    Maria Arroyo, Maria Arroyo                   MRN: [id number] Proc. Date: 05/27/00 Adm. Date:  [id number] Attending:  Thyra Breed CC:         Hanley Seamen, M.D.  Talmadge Coventry, M.D.   Procedure Report  PROCEDURE:  Lumbar epidural steroid injection.  DIAGNOSIS:  Lumbar spondylosis with epidural scarring and persistent radiculopathy into the left lower extremity.  INTERVAL HISTORY:  The patients noted marked improvement after two epidurals. She continues to have a bit of tingling out into her left lower extremity but overall she rates her pain at 1/10. She continues on her previous medications.  PHYSICAL EXAMINATION:  Blood pressure 139/62, heart rate 50, respiratory rate 18, O2 saturations 98%, pain level is 1/10, and temperature is 98.7. She has good healing at her previous injection site.  DESCRIPTION OF PROCEDURE:  After informed consent was obtained, the patient was placed in the sitting position and monitored. The patients back was prepped with Betadine x 3. A skin wheal was raised at the L3-4 interspace with 1 percent lidocaine. A 20 gauge Tuohy needle was introduced to the lumbar epidural space to loss of resistance to preservative free normal saline. There was no cerebrospinal fluid nor blood. 80 mg of Medrol and 8 ml of preservative free normal saline was gently injected. The needle was flushed with preservative free normal saline and removed intact.  CONDITION POST PROCEDURE:  Stable.  DISCHARGE INSTRUCTIONS:  Resume previous diet. Limitations in activities per instruction sheet. Continue on current medications. Follow-up with me in six weeks. DD:  05/27/00 TD:  05/27/00 Job: [id number]

## 2010-08-15 NOTE — Consult Note (Signed)
NAME:  Maria Arroyo, Maria Arroyo                      ACCOUNT NO.:  [id number]   MEDICAL RECORD NO.:  [id number]                   PATIENT TYPE:  INP   LOCATION:  5531                                 FACILITY:  [facility]   PHYSICIAN:  Colleen Can. Deborah Chalk, M.D.            DATE OF BIRTH:  [birth year]   DATE OF CONSULTATION:  06/14/2002  DATE OF DISCHARGE:                                   CONSULTATION   HISTORY:  The patient is a 60 year old female referred today with COPD and  asthma exacerbation that started with a cough yesterday, then subsequent  shortness of breath.  She has grown weaker as the day has progressed and  then has developed an intermittent substernal chest pain.  She went to her  primary care physician and while she had a cough and chest tightness, she  suddenly began to develop a deep substernal chest pain.  She has a history  of four stents in the past and is followed by Peter M. [place], M.D.  This  current chest pain is similar to her previous angina.   She has a history of stents to her proximal right coronary artery in October  2002.  Her prior cardiac history includes stents in the mid-right coronary  artery in December 1999 by Thereasa Solo. Little, M.D., using a 3.0 x 25 mm NIR  Primo stent.  She had angioplasty of the distal right coronary artery.  She  had a 3.0 x 23 mm Zeta stent in October 2002.  She was admitted by Dr.  [place] in October 2002.   PAST MEDICAL HISTORY:  1. Hypertension.  2. Hypercholesterolemia.  3. Chronic low back pain with a herniated disk being treated by Loraine Leriche L.     Vear Clock, M.D.  4. History of prior bladder surgery.  5. History of hysterectomy.   ALLERGIES:  None known.   MEDICATIONS:  Clonazepam, Zocor, TriCor, K-Dur, Lasix, aspirin, Advair,  Soma, and nitroglycerin.   SOCIAL HISTORY:  She is a housewife.  She has a chronic smoking history of  at least a half-pack of cigarettes per day and continues to smoke.  She  previously smoked three  packs of cigarettes a day.  She denies alcohol use.  She is married.  She has two children.   FAMILY HISTORY:  Her father died at age 49 of a CVA.  He had a myocardial  infarction and congestive heart failure.  Mother died at age 28 with brain  aneurysm.  One brother died at age 76 of myocardial infarction.   REVIEW OF SYSTEMS:  Mainly remarkable for chronic back and leg pain.   PHYSICAL EXAMINATION:  GENERAL:  She is a pleasant white female who appears  older than her stated age of 73.  VITAL SIGNS:  Blood pressure is 150/80, heart rate 88, temperature was  100.7, O2 saturation was 98% on supplemental oxygen.  SKIN:  Warm and dry.  Color is normal.  CHEST:  Lungs show diffuse crackles throughout.  CARDIAC:  Regular rate and rhythm without murmur.  ABDOMEN:  Soft, nontender.  EXTREMITIES:  Without edema today.  NEUROLOGIC:  She is intact.   LABORATORY DATA:  EKG was basically normal.   OVERALL IMPRESSION:  1. Chest pain, cardiac versus chest wall.  2. Known atherosclerotic cardiovascular disease with previous stents.  3. History of chronic obstructive pulmonary disease and asthma with recent     exacerbation.  4. Tobacco abuse, ongoing.  5. Hyperlipidemia.   PLAN:  1. Will transfer to telemetry.  2. Will check enzymes, use topical nitrates and Lovenox.                                               Colleen Can. Deborah Chalk, M.D.    SNT/MEDQ  D:  06/14/2002  T:  06/15/2002  Job:  [id number]
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
## 2010-08-15 NOTE — Cardiovascular Report (Signed)
[place]  Patient:    Maria Arroyo, Maria Arroyo Visit Number: [id number] MRN: [id number]          Service Type: CAT Location: [facility] 2899 06 Attending Physician:  [place], Peter Manning Dictated by:   Peter M. [place], M.D. Proc. Date: 03/24/01 Admit Date:  03/24/2001 Discharge Date: 03/24/2001   CC:         Talmadge Coventry, M.D.   Cardiac Catheterization  INDICATIONS FOR PROCEDURE:  The patient is a 60 year old white female status post prior interventions on the right coronary artery, most recently in October 2002, who presents with recurrent chest pain.  ACCESS:  Via the right femoral artery using standard Seldinger technique.  EQUIPMENT:  The 6-French 4-cm right and left Judkins catheters, 6-French pigtail catheter, 6-French arterial sheath.  MEDICATIONS:  Local anesthesia 1% Xylocaine.  CONTRAST:  Omnipaque, 110 cc.  HEMODYNAMIC DATA:  Aortic pressure is 113-57 with a mean of 79.  Left ventricular pressure is 114 with an EDP of 16 mmHg.  ANGIOGRAPHIC DATA: 1. The left coronary artery arises and distributes normally. 2. The left main coronary artery is normal. 3. The left anterior descending artery is mildly calcified.  It has minor    wall irregularities of less than 10%. 4. The left circumflex coronary artery also has minor wall irregularities of    less than 10%. 5. The right coronary artery arises and distributes normally.  There is a    20-30% stenosis in the proximal right coronary artery prior to the initial    stent.  The proximal stent is widely patent.  There is a longer stent in    the mid right coronary artery which also remains patent but has diffuse    30% in-stent disease.  The distal right coronary artery is without    significant disease.  Left ventricular angiography performed in the RAO view demonstrates normal left ventricular size and contractility with normal systolic function. Ejection fraction is estimated at  55%.  FINAL INTERPRETATION: 1. Nonobstructive atherosclerotic coronary artery disease. 2. Continued patency of the prior stents in the mid and proximal right    coronary artery. 3. Normal left ventricular function.  PLAN:  Would recommend evaluation for noncardiac chest pain.Dictated by: Peter M. [place], M.D. Attending Physician:  [place], Peter Manning DD:  03/24/01 TD:  03/24/01 Job: [id number]

## 2010-08-15 NOTE — Discharge Summary (Signed)
[place]. [facility]  Patient:    Maria Arroyo, Maria Arroyo Visit Number: [id number] MRN: [id number]          Service Type: CAT Location: 3700 3711 01 Attending Physician:  [place], Peter Manning Dictated by:   Peter M. [place], M.D. Admit Date:  01/06/2001 Discharge Date: 01/07/2001   CC:         Talmadge Coventry, M.D.   Discharge Summary  HISTORY OF PRESENT ILLNESS:  Ms. Blumenstein is a 60 year old white female with known history of coronary artery disease, status post prior stenting in the mid right coronary artery and distal right coronary artery in 1999.  She presents now with symptoms of chronic chest pain consistent with angina.  The patient has a history of tobacco abuse, hypertension, and hypercholesterolemia.  For details of her past medical history, social history, family history, and physical exam please see admission history and physical.  LABORATORY DATA:  ECG showed normal sinus rhythm, nonspecific T-wave abnormality.  CBC was unremarkable.  Chemistry panel was normal.  Coags were normal.  Lipid panel showed cholesterol of 214, HDL of 37.  LDL could not be calculated.  Triglyceride level was 466.  HOSPITAL COURSE:  The patient was admitted and underwent coronary angiography. This demonstrated no significant obstructive disease in the left coronary system.  The right coronary had an 80% stenosis proximally that was focal. The mid right coronary artery at the prior stent site had diffuse 30% narrowing.  The distal right coronary was still widely patent at the prior angioplasty site.  Proximal right coronary lesion was stented using a 3.0 x 13 mm Zeta stent.  This yielded and excellent angiograph result with 0% residual stenosis.  The patient tolerated the procedure well and was discharged home the following day.  We added Niaspan to her Lipitor for further lipid reduction.  She was also placed on Plavix.  We discussed smoking cessation  strategies and smoking cessation consult was obtained prior to discharge.  DISCHARGE DIAGNOSES:  1. Angina pectoris.  2. Atherosclerotic coronary artery disease.  3. Combined hyperlipidemia.  4. Tobacco abuse.  5. Chronic back pain.  DISCHARGE MEDICATIONS:  1. Coated aspirin 325 mg daily.  2. Plavix 75 mg daily for 30 days.  3. Lipitor 80 mg daily.  4. Niaspan 500 mg q.h.s.  5. Lasix 40 mg per day.  6. Zoloft 100 mg per day.  7. Elavil 25 mg daily.  8. Estradiol 2 mg daily.  9. Multivitamin daily. 10. Prevacid 30 mg twice a day. 11. Clonazepam 1 mg in the morning and 2 mg in the evening. 12. Advair 500/50 mg 1 puff daily. 13. Oxycodone 40 mg twice a day. 14. Nitroglycerin p.r.n.  DISCHARGE INSTRUCTIONS:  The patient is instructed to stop Coreg due to marked bradycardia.  She is to progressively walk.  She will continue a low-fat diet. Follow up with Dr. [place]and in two weeks.  DISCHARGE STATUS:  Improved. Dictated by:   Peter M. [place], M.D. Attending Physician:  [place], Peter Manning DD:  01/07/01 TD:  01/07/01 Job: [id number]

## 2010-08-15 NOTE — Cardiovascular Report (Signed)
[place]. [facility]  Patient:    TENNILE, STYLES Visit Number: [id number] MRN: [id number]          Service Type: CAT Location: 3700 3711 01 Attending Physician:  [place], Peter Manning Dictated by:   Peter M. [place], M.D. Proc. Date: 01/06/01 Admit Date:  01/06/2001   CC:         Talmadge Coventry, M.D.   Cardiac Catheterization  INDICATIONS FOR PROCEDURE: The patient is a 60 year old, white female, history of heavy tobacco abuse, hypercholesterolemia, and hypertension, who presents with refractory chest pain. She is status post prior stenting of the right coronary artery in the mid vessel in 1999.  ACCESS: Via the right femoral artery using the standard Seldinger technique.  EQUIPMENT: The 6 French 4 cm right and left Judkins catheter, 6 French pigtail catheter, 6 French arterial sheath, 7 French arterial sheath, 7 [place] guide with side holes, 0.014 Hi-Torque Floppy wire, a 3.0 x 13 mm Zeta stent.  MEDICATIONS: Nitroglycerin 200 mcg intracoronary x2, heparin 6000 units IV.  CONTRAST: Omnipaque 275 cc.  HEMODYNAMIC DATA: Aortic pressure is 123/60 with a mean of 86.  Left ventricular pressure is 127 with an EDP of 17 mmHg.  ANGIOGRAPHIC DATA: Left coronary artery: The left coronary artery arises and distributes normally.  Left main: The left main coronary artery is normal.  Left anterior descending: The left anterior descending artery is mildly calcified proximally. There is 20% narrowing in the proximal vessel with minor wall irregularities of less than 20% in the mid vessel. There is a small diagonal branch which has 30% narrowing in its ostium.  There is a moderate sized ramus intermedius branch which appears normal.  Left circumflex: The left circumflex coronary artery has minor wall irregularities less than 10%.  Right coronary artery: The right coronary artery arises and distributes normally.  It is a dominant vessel. In the  proximal vessel, there is a focal 80% stenosis. In the mid vessel at the site of the previous stent there is diffuse 30% in-stent re-stenosis. In the distal vessel the prior angioplasty site remains widely patent with less than 10% residual stenosis.  LEFT VENTRICULAR ANGIOGRAPHY: The left ventricular angiography performed in the RAO view demonstrates normal left ventricular size and contractility. Ejection fraction is estimated at 65%.  We proceeded at this point with intervention of the proximal right coronary artery. This lesion was easily crossed with a wire. We primarily stented it using a 3.0 x 13 mm Zeta stent. This was deployed and then postdilated to 14 atmospheres.  This yielded an excellent angiographic result with a 0% residual stenosis.  There was no evidence of dissection or intraluminal filling defects.  FINAL INTERPRETATION: 1. Single-vessel obstructive atherosclerotic coronary artery disease. 2. Continued long-term patency of the prior stent in the mid right coronary    artery and angioplasty site in the distal right coronary artery. 3. Normal left ventricular function. 4. Successful stenting of the proximal right coronary artery. Dictated by:   Peter M. [place], M.D. Attending Physician:  [place], Peter Manning DD:  01/06/01 TD:  01/07/01 Job: [id number]

## 2010-08-15 NOTE — Op Note (Signed)
[facility]  Patient:    Maria Arroyo, Maria Arroyo                   MRN: [id number] Proc. Date: 05/20/00 Adm. Date:  [id number] Attending:  Thyra Breed CC:         Julio Sicks, M.D.  Burna Forts, M.D.   Operative Report  PROCEDURE:  Lumbar epidural steroid injection.  DIAGNOSES:  Lumbar spondylosis with epidural scarring and lumbar radiculopathy into the left lower extremity.  INTERVAL HISTORY:  The patient has noted pretty significant reductions in her discomfort after the first injection. She is here for a repeat today. She has had minimal problems with the first injection. She feels much better overall.  PHYSICAL EXAMINATION: VITAL SIGNS:  Blood pressure is 133/70, heart rate 55, respiratory rate 20 and saturation is 99%.  Pain level is 3:10 and temperature 97.4.  BACK:  Her back shows good healing from previous injection site.  DESCRIPTION OF PROCEDURE:  After informed consent was obtained, the patient was placed in a sitting position and monitored.  Her back was prepped with Betadine x 3. A skin wheal was raised at the L3-4 inner space with 1% lidocaine.  A #20 gauge Tuohy needle was introduced through the lumbar epidural space to loss of resistance to preservative-free normal saline to a depth of 6.5 cm.  There was no CSF nor blood.  Medrol 80 mg and 8 mL of preservative-free normal saline was gently injected.  The needle was flushed with preservative-free normal saline and removed intact.  POSTPROCEDURE CONDITION:  Stable.  DISCHARGE INSTRUCTIONS: 1. Resume previous diet. 2. Limitation of activities per instruction sheet as outlined by    my assistant today. 3. Continue on current medications. 4. Follow-up up with me in one week for third injection. DD:  05/20/00 TD:  05/21/00 Job: [id number]

## 2010-09-29 ENCOUNTER — Other Ambulatory Visit: Payer: Self-pay | Admitting: Cardiology

## 2010-09-29 MED ORDER — CLOPIDOGREL BISULFATE 75 MG PO TABS: 75.0000 mg | ORAL_TABLET | Freq: Every day | ORAL | Status: DC

## 2010-09-29 NOTE — Telephone Encounter (Signed)
Pt called said she wants refill of plavix only 3 pills left She doesn't use [facility] anymore She now uses [facility] [phone] let her know

## 2010-09-29 NOTE — Telephone Encounter (Signed)
Called requesting refill on Plavix. Sent to [facility]

## 2010-10-03 ENCOUNTER — Encounter: Payer: Self-pay | Admitting: Cardiology

## 2010-10-09 ENCOUNTER — Encounter: Payer: Self-pay | Admitting: Cardiology

## 2010-10-10 ENCOUNTER — Ambulatory Visit: Payer: Self-pay | Admitting: Cardiology

## 2010-10-15 ENCOUNTER — Ambulatory Visit (INDEPENDENT_AMBULATORY_CARE_PROVIDER_SITE_OTHER): Payer: BC Managed Care – PPO | Admitting: Cardiology

## 2010-10-15 ENCOUNTER — Encounter: Payer: Self-pay | Admitting: Cardiology

## 2010-10-15 VITALS — BP 140/102 | HR 78 | Ht 61.0 in | Wt 192.0 lb

## 2010-10-15 DIAGNOSIS — F32A Depression, unspecified: Secondary | ICD-10-CM

## 2010-10-15 DIAGNOSIS — I10 Essential (primary) hypertension: Secondary | ICD-10-CM

## 2010-10-15 DIAGNOSIS — I2510 Atherosclerotic heart disease of native coronary artery without angina pectoris: Secondary | ICD-10-CM

## 2010-10-15 DIAGNOSIS — F17200 Nicotine dependence, unspecified, uncomplicated: Secondary | ICD-10-CM

## 2010-10-15 DIAGNOSIS — E785 Hyperlipidemia, unspecified: Secondary | ICD-10-CM

## 2010-10-15 DIAGNOSIS — Z720 Tobacco use: Secondary | ICD-10-CM

## 2010-10-15 DIAGNOSIS — F3289 Other specified depressive episodes: Secondary | ICD-10-CM

## 2010-10-15 DIAGNOSIS — J449 Chronic obstructive pulmonary disease, unspecified: Secondary | ICD-10-CM

## 2010-10-15 DIAGNOSIS — F329 Major depressive disorder, single episode, unspecified: Secondary | ICD-10-CM

## 2010-10-15 DIAGNOSIS — F17210 Nicotine dependence, cigarettes, uncomplicated: Secondary | ICD-10-CM | POA: Insufficient documentation

## 2010-10-15 DIAGNOSIS — J4489 Other specified chronic obstructive pulmonary disease: Secondary | ICD-10-CM

## 2010-10-15 MED ORDER — NITROGLYCERIN 0.4 MG SL SUBL: 0.4000 mg | SUBLINGUAL_TABLET | SUBLINGUAL | Status: DC | PRN

## 2010-10-15 NOTE — Progress Notes (Signed)
Maria Arroyo Date of Birth: [birth year]   History of Present Illness: Maria Arroyo is seen today for followup. She is now seeing Dr. Cyndia Arroyo for her medical care. He has been working with her on her depression. She is now on Cymbalta. She is actually feeling much better. Previously she was experiencing a lot of pain about her body and felt like there was a thousand pounds weighing on her. This symptom has improved. She decided not to have knee surgery and start walking again. She feels that this is helped as well. She does experience some edema in her ankles at the end of the day. She notes that she was switched from Vytorin to combination of atorvastatin and fenofibrate. She denies any chest pain or shortness of breath. She does continue to smoke one pack per day.  Current Outpatient Prescriptions on File Prior to Visit  Medication Sig Dispense Refill  . amLODipine (NORVASC) 10 MG tablet Take 10 mg by mouth daily.        [name] aspirin 325 MG tablet Take 325 mg by mouth daily.        [name] atorvastatin (LIPITOR) 40 MG tablet Take 1 tablet by mouth Daily.      . Calcium Carbonate-Vitamin D (CALCIUM 600 + D PO) Take by mouth daily.        . carisoprodol (SOMA) 350 MG tablet Take 350 mg by mouth 4 (four) times daily as needed.        . Cholecalciferol (VITAMIN D) 2000 UNITS tablet Take 4,000 Units by mouth daily.        . clonazePAM (KLONOPIN) 2 MG tablet Take 2 mg by mouth 2 (two) times daily as needed.        . clopidogrel (PLAVIX) 75 MG tablet Take 1 tablet (75 mg total) by mouth daily.  30 tablet  5  . CYMBALTA 60 MG capsule Take 1 tablet by mouth Daily.      . fenofibrate micronized (LOFIBRA) 200 MG capsule Take 200 mg by mouth daily before breakfast.        . Multiple Vitamin (MULTIVITAMIN) tablet Take 1 tablet by mouth daily.        [name] oxyCODONE-acetaminophen (PERCOCET) 10-325 MG per tablet Take 1 tablet by mouth every 4 (four) hours as needed.        . temazepam (RESTORIL) 30 MG capsule Take  30 mg by mouth at bedtime as needed.        . torsemide (DEMADEX) 100 MG tablet Take 200 mg by mouth daily.        [name] DISCONTD: nitroGLYCERIN (NITROSTAT) 0.4 MG SL tablet Place 0.4 mg under the tongue every 5 (five) minutes as needed.          Allergies  Allergen Reactions  . Ace Inhibitors Other (See Comments)    Worsening renal function   . Codeine-Guaifenesin (Tusso-C)     Past Medical History  Diagnosis Date  . Coronary artery disease   . COPD (chronic obstructive pulmonary disease)   . Hypertension   . Hyperlipidemia   . Tobacco abuse   . OA (osteoarthritis)   . Depression     Past Surgical History  Procedure Date  . Cardiac catheterization 04/09/2009    EF 60%  . Cardiac catheterization 03/24/2001    EF 55%  . Cardiac catheterization 01/06/2001    EF 65%  . Bladder surgery   . Coronary stent placement 03/2009    STENTING OF THE PROXIMAL TO MID RIGHT  CORONARY  . Abdominal hysterectomy     History  Smoking status  . Current Everyday Smoker -- 1.0 packs/day  Smokeless tobacco  . Not on file  Comment: has cut back on smoking / down to 1/2 pk daily    History  Alcohol Use No    Family History  Problem Relation Age of Onset  . Aneurysm Mother   . Heart attack Father   . Heart failure Father   . Stroke Sister   . Heart attack Brother     Review of Systems: The review of systems is positive for chronic depression. She is under a great deal of family stress. She has had some numbness in her fingers and is now wearing splints at night. She reports that her blood pressure has been doing well.All other systems were reviewed and are negative.  Physical Exam: BP 140/102  Pulse 78  Ht 5\' 1"  (1.549 m)  Wt 192 lb (87.091 kg)  BMI 36.28 kg/m2 She is a chronically ill-appearing white female in no acute distress. Her mood is good. She is normocephalic, atraumatic. Pupils are equal round and reactive light accommodation. Extraocular movements are full. Oropharynx is  clear. She has no JVD, adenopathy, thyromegaly, or bruits. Lungs are clear. Cardiac exam reveals a regular rate and rhythm without gallop, murmur, or click. She has no significant edema. Pedal pulses are palpable. She is alert and oriented x3. Cranial nerves II through XII are intact. LABORATORY DATA: ECG demonstrates normal sinus rhythm with minor nonspecific T-wave abnormality.  Assessment / Plan:

## 2010-10-15 NOTE — Assessment & Plan Note (Signed)
We again stressed the importance of smoking cessation.

## 2010-10-15 NOTE — Patient Instructions (Signed)
Continue your current medications.  Watch salt intake.  Monitor your blood pressure at home. Call if the top number is staying over 140 or the bottom number over 90.   I will see you again in 6 months.

## 2010-10-15 NOTE — Assessment & Plan Note (Signed)
We will obtain a copy of her most recent lab work from Dr. Cyndia Bent. We will continue with atorvastatin and fenofibrate.

## 2010-10-15 NOTE — Assessment & Plan Note (Signed)
Her blood pressure is mildly elevated today but she reports good blood pressure control and in fact her previous blood pressure readings here have been acceptable. She is going to monitor blood pressure closely and call if it is remaining over 140/90.

## 2010-10-15 NOTE — Assessment & Plan Note (Signed)
She continues to do well from a cardiac standpoint. She is asymptomatic at this point. We will continue with risk factor modification and dual antiplatelet therapy.

## 2011-02-02 DIAGNOSIS — M797 Fibromyalgia: Secondary | ICD-10-CM | POA: Insufficient documentation

## 2011-02-02 DIAGNOSIS — G8929 Other chronic pain: Secondary | ICD-10-CM | POA: Insufficient documentation

## 2011-02-02 DIAGNOSIS — M1990 Unspecified osteoarthritis, unspecified site: Secondary | ICD-10-CM | POA: Insufficient documentation

## 2011-02-04 ENCOUNTER — Encounter (HOSPITAL_COMMUNITY): Payer: Self-pay | Admitting: Pharmacy Technician

## 2011-02-05 ENCOUNTER — Encounter (HOSPITAL_COMMUNITY)
Admission: RE | Admit: 2011-02-05 | Discharge: 2011-02-05 | Disposition: A | Discharge status: Home / Self Care | Payer: BC Managed Care – PPO | Source: Ambulatory Visit | Attending: Anesthesiology | Admitting: Anesthesiology

## 2011-02-05 ENCOUNTER — Encounter (HOSPITAL_COMMUNITY)
Admission: RE | Admit: 2011-02-05 | Discharge: 2011-02-05 | Disposition: A | Discharge status: Home / Self Care | Payer: BC Managed Care – PPO | Source: Ambulatory Visit | Attending: Neurosurgery | Admitting: Neurosurgery

## 2011-02-05 ENCOUNTER — Encounter (HOSPITAL_COMMUNITY): Payer: Self-pay

## 2011-02-05 LAB — CBC
HCT: 42.4 % (ref 36.0–46.0)
Hemoglobin: 14.5 g/dL (ref 12.0–15.0)
MCH: 32.4 pg (ref 26.0–34.0)
MCV: 94.6 fL (ref 78.0–100.0)
RBC: 4.48 MIL/uL (ref 3.87–5.11)
WBC: 11 10*3/uL — ABNORMAL HIGH (ref 4.0–10.5)

## 2011-02-05 LAB — BASIC METABOLIC PANEL
CO2: 33 mEq/L — ABNORMAL HIGH (ref 19–32)
Calcium: 9.5 mg/dL (ref 8.4–10.5)
Chloride: 98 mEq/L (ref 96–112)
Glucose, Bld: 107 mg/dL — ABNORMAL HIGH (ref 70–99)
Potassium: 3.8 mEq/L (ref 3.5–5.1)
Sodium: 140 mEq/L (ref 135–145)

## 2011-02-05 MED ORDER — CEFAZOLIN SODIUM 1-5 GM-% IV SOLN: 1.0000 g | INTRAVENOUS | Status: DC

## 2011-02-05 NOTE — Pre-Procedure Instructions (Signed)
Maria Arroyo  02/05/2011   Your procedure is scheduled on: NOV 9 Report to [facility] at 0830 AM.  Call this number if you have problems the morning of surgery: [phone]   Remember:   Do not eat food:After Midnight.  Do not drink clear liquids:4 HOURS    Take these medicines the morning of surgery with A SIP OF WATER: AMLODIPINE,OXYCODONE,SOMA  Do not wear jewelry, make-up or nail polish.  Do not wear lotions, powders, or perfumes. You may wear deodorant.  Do not shave 48 hours prior to surgery.  Do not bring valuables to the hospital.  Contacts, dentures or bridgework may not be worn into surgery.  Leave suitcase in the car. After surgery it may be brought to your room.  For patients admitted to the hospital, checkout time is 11:00 AM the day of discharge.   Patients discharged the day of surgery will not be allowed to drive home.  Name and phone number of your driver:FAMILY Special Instructions: CHG Shower Use Special Wash: 1/2 bottle night before surgery and 1/2 bottle morning of surgery.   Please read over the following fact sheets that you were given: Coughing and Deep Breathing, MRSA Information and Surgical Site Infection Prevention

## 2011-02-05 NOTE — Progress Notes (Signed)
REQUESTED NOTES /EKG  DR PETER [place]

## 2011-02-06 ENCOUNTER — Encounter (HOSPITAL_COMMUNITY)
Admission: RE | Disposition: A | Discharge status: Home / Self Care | Payer: Self-pay | Source: Ambulatory Visit | Attending: Neurosurgery

## 2011-02-06 ENCOUNTER — Ambulatory Visit (HOSPITAL_COMMUNITY)
Admission: RE | Admit: 2011-02-06 | Discharge: 2011-02-07 | Disposition: A | Discharge status: Home / Self Care | Payer: BC Managed Care – PPO | Source: Ambulatory Visit | Attending: Neurosurgery | Admitting: Neurosurgery

## 2011-02-06 ENCOUNTER — Encounter (HOSPITAL_COMMUNITY): Payer: Self-pay | Admitting: *Deleted

## 2011-02-06 ENCOUNTER — Encounter (HOSPITAL_COMMUNITY): Payer: Self-pay | Admitting: Certified Registered"

## 2011-02-06 ENCOUNTER — Other Ambulatory Visit: Payer: Self-pay

## 2011-02-06 ENCOUNTER — Ambulatory Visit (HOSPITAL_COMMUNITY): Payer: BC Managed Care – PPO

## 2011-02-06 ENCOUNTER — Ambulatory Visit (HOSPITAL_COMMUNITY): Payer: BC Managed Care – PPO | Admitting: Certified Registered"

## 2011-02-06 DIAGNOSIS — Z01810 Encounter for preprocedural cardiovascular examination: Secondary | ICD-10-CM | POA: Insufficient documentation

## 2011-02-06 DIAGNOSIS — M50222 Other cervical disc displacement at C5-C6 level: Secondary | ICD-10-CM

## 2011-02-06 DIAGNOSIS — J449 Chronic obstructive pulmonary disease, unspecified: Secondary | ICD-10-CM | POA: Insufficient documentation

## 2011-02-06 DIAGNOSIS — M4712 Other spondylosis with myelopathy, cervical region: Secondary | ICD-10-CM | POA: Insufficient documentation

## 2011-02-06 DIAGNOSIS — I10 Essential (primary) hypertension: Secondary | ICD-10-CM | POA: Insufficient documentation

## 2011-02-06 DIAGNOSIS — F329 Major depressive disorder, single episode, unspecified: Secondary | ICD-10-CM | POA: Insufficient documentation

## 2011-02-06 DIAGNOSIS — F411 Generalized anxiety disorder: Secondary | ICD-10-CM | POA: Insufficient documentation

## 2011-02-06 DIAGNOSIS — F3289 Other specified depressive episodes: Secondary | ICD-10-CM | POA: Insufficient documentation

## 2011-02-06 DIAGNOSIS — M4802 Spinal stenosis, cervical region: Secondary | ICD-10-CM | POA: Insufficient documentation

## 2011-02-06 DIAGNOSIS — Z9861 Coronary angioplasty status: Secondary | ICD-10-CM | POA: Insufficient documentation

## 2011-02-06 DIAGNOSIS — J4489 Other specified chronic obstructive pulmonary disease: Secondary | ICD-10-CM | POA: Insufficient documentation

## 2011-02-06 HISTORY — PX: ANTERIOR CERVICAL DECOMP/DISCECTOMY FUSION: SHX1161

## 2011-02-06 SURGERY — ANTERIOR CERVICAL DECOMPRESSION/DISCECTOMY FUSION 2 LEVELS
Anesthesia: General | Site: Neck | Wound class: Clean

## 2011-02-06 MED ORDER — HYDROCODONE-ACETAMINOPHEN 5-325 MG PO TABS: 1.0000 | ORAL_TABLET | ORAL | Status: DC | PRN

## 2011-02-06 MED ORDER — EZETIMIBE 10 MG PO TABS
10.0000 mg | ORAL_TABLET | Freq: Every day | ORAL | Status: DC
  Administered 2011-02-06: 10 mg via ORAL
  Filled 2011-02-06 (×2): qty 1

## 2011-02-06 MED ORDER — DULOXETINE HCL 60 MG PO CPEP
60.0000 mg | ORAL_CAPSULE | Freq: Every day | ORAL | Status: DC
  Filled 2011-02-06: qty 1

## 2011-02-06 MED ORDER — POTASSIUM CHLORIDE IN NACL 20-0.9 MEQ/L-% IV SOLN
INTRAVENOUS | Status: DC
  Administered 2011-02-06: 20:00:00 via INTRAVENOUS
  Filled 2011-02-06 (×2): qty 1000

## 2011-02-06 MED ORDER — HYDROMORPHONE HCL PF 1 MG/ML IJ SOLN
0.2500 mg | INTRAMUSCULAR | Status: DC | PRN
  Administered 2011-02-06 (×2): 0.5 mg via INTRAVENOUS

## 2011-02-06 MED ORDER — SODIUM CHLORIDE 0.9 % IJ SOLN: 3.0000 mL | Freq: Two times a day (BID) | INTRAMUSCULAR | Status: DC

## 2011-02-06 MED ORDER — TORSEMIDE 20 MG PO TABS
20.0000 mg | ORAL_TABLET | Freq: Every day | ORAL | Status: DC
  Administered 2011-02-06: 20 mg via ORAL
  Filled 2011-02-06 (×2): qty 1

## 2011-02-06 MED ORDER — ACETAMINOPHEN 325 MG PO TABS: 650.0000 mg | ORAL_TABLET | ORAL | Status: DC | PRN

## 2011-02-06 MED ORDER — PROPOFOL 10 MG/ML IV EMUL
INTRAVENOUS | Status: DC | PRN
  Administered 2011-02-06: 150 mg via INTRAVENOUS

## 2011-02-06 MED ORDER — VITAMIN D3 25 MCG (1000 UNIT) PO TABS
4000.0000 [IU] | ORAL_TABLET | Freq: Every day | ORAL | Status: DC
  Administered 2011-02-06: 4000 [IU] via ORAL
  Filled 2011-02-06 (×2): qty 4

## 2011-02-06 MED ORDER — ONDANSETRON HCL 4 MG/2ML IJ SOLN
INTRAMUSCULAR | Status: DC | PRN
  Administered 2011-02-06: 4 mg via INTRAVENOUS

## 2011-02-06 MED ORDER — ONDANSETRON HCL 4 MG/2ML IJ SOLN: 4.0000 mg | INTRAMUSCULAR | Status: DC | PRN

## 2011-02-06 MED ORDER — OXYCODONE-ACETAMINOPHEN 5-325 MG PO TABS
1.0000 | ORAL_TABLET | ORAL | Status: DC | PRN
  Administered 2011-02-06 – 2011-02-07 (×3): 2 via ORAL
  Filled 2011-02-06 (×3): qty 2

## 2011-02-06 MED ORDER — ALUM & MAG HYDROXIDE-SIMETH 200-200-20 MG/5ML PO SUSP: 30.0000 mL | Freq: Four times a day (QID) | ORAL | Status: DC | PRN

## 2011-02-06 MED ORDER — ALBUTEROL SULFATE (2.5 MG/3ML) 0.083% IN NEBU
INHALATION_SOLUTION | RESPIRATORY_TRACT | Status: DC | PRN
  Administered 2011-02-06: 3 mL via RESPIRATORY_TRACT

## 2011-02-06 MED ORDER — NEOSTIGMINE METHYLSULFATE 1 MG/ML IJ SOLN
INTRAMUSCULAR | Status: DC | PRN
  Administered 2011-02-06: 3 mg via INTRAVENOUS

## 2011-02-06 MED ORDER — CEFAZOLIN SODIUM 1-5 GM-% IV SOLN
INTRAVENOUS | Status: DC | PRN
  Administered 2011-02-06: 2 g via INTRAVENOUS

## 2011-02-06 MED ORDER — SODIUM CHLORIDE 0.9 % IJ SOLN
3.0000 mL | INTRAMUSCULAR | Status: DC | PRN
  Administered 2011-02-06: 3 mL via INTRAVENOUS

## 2011-02-06 MED ORDER — TEMAZEPAM 30 MG PO CAPS: 30.0000 mg | ORAL_CAPSULE | Freq: Every evening | ORAL | Status: DC | PRN

## 2011-02-06 MED ORDER — SODIUM CHLORIDE 0.9 % IV SOLN: 250.0000 mL | INTRAVENOUS | Status: DC

## 2011-02-06 MED ORDER — ASPIRIN 325 MG PO TABS
325.0000 mg | ORAL_TABLET | Freq: Every day | ORAL | Status: DC
  Administered 2011-02-06: 325 mg via ORAL
  Filled 2011-02-06 (×2): qty 1

## 2011-02-06 MED ORDER — PHENOL 1.4 % MT LIQD: 1.0000 | OROMUCOSAL | Status: DC | PRN

## 2011-02-06 MED ORDER — ONDANSETRON HCL 4 MG/2ML IJ SOLN: 4.0000 mg | Freq: Once | INTRAMUSCULAR | Status: DC | PRN

## 2011-02-06 MED ORDER — ALUM & MAG HYDROXIDE-SIMETH 400-400-40 MG/5ML PO SUSP: 30.0000 mL | Freq: Four times a day (QID) | ORAL | Status: DC | PRN

## 2011-02-06 MED ORDER — SODIUM CHLORIDE 0.9 % IR SOLN
Status: DC | PRN
  Administered 2011-02-06: 1000 mL

## 2011-02-06 MED ORDER — DOCUSATE SODIUM 100 MG PO CAPS
100.0000 mg | ORAL_CAPSULE | Freq: Two times a day (BID) | ORAL | Status: DC
  Administered 2011-02-06: 100 mg via ORAL
  Filled 2011-02-06: qty 1

## 2011-02-06 MED ORDER — GLYCOPYRROLATE 0.2 MG/ML IJ SOLN
INTRAMUSCULAR | Status: DC | PRN
  Administered 2011-02-06: .4 mg via INTRAVENOUS

## 2011-02-06 MED ORDER — ROCURONIUM BROMIDE 100 MG/10ML IV SOLN
INTRAVENOUS | Status: DC | PRN
  Administered 2011-02-06: 10 mg via INTRAVENOUS
  Administered 2011-02-06: 40 mg via INTRAVENOUS

## 2011-02-06 MED ORDER — MENTHOL 3 MG MT LOZG: 1.0000 | LOZENGE | OROMUCOSAL | Status: DC | PRN

## 2011-02-06 MED ORDER — ACETAMINOPHEN 650 MG RE SUPP: 650.0000 mg | RECTAL | Status: DC | PRN

## 2011-02-06 MED ORDER — ROSUVASTATIN CALCIUM 10 MG PO TABS
10.0000 mg | ORAL_TABLET | Freq: Every day | ORAL | Status: DC
  Administered 2011-02-06: 10 mg via ORAL
  Filled 2011-02-06 (×2): qty 1

## 2011-02-06 MED ORDER — DEXAMETHASONE SODIUM PHOSPHATE 4 MG/ML IJ SOLN
INTRAMUSCULAR | Status: DC | PRN
  Administered 2011-02-06: 8 mg via INTRAVENOUS

## 2011-02-06 MED ORDER — CEFAZOLIN SODIUM-DEXTROSE 2-3 GM-% IV SOLR
2.0000 g | Freq: Once | INTRAVENOUS | Status: DC
  Filled 2011-02-06: qty 50

## 2011-02-06 MED ORDER — FENTANYL CITRATE 0.05 MG/ML IJ SOLN
INTRAMUSCULAR | Status: DC | PRN
  Administered 2011-02-06: 50 ug via INTRAVENOUS
  Administered 2011-02-06: 150 ug via INTRAVENOUS
  Administered 2011-02-06: 50 ug via INTRAVENOUS

## 2011-02-06 MED ORDER — THROMBIN 5000 UNITS EX KIT
PACK | CUTANEOUS | Status: DC | PRN
  Administered 2011-02-06 (×2): 5000 [IU] via TOPICAL

## 2011-02-06 MED ORDER — CARISOPRODOL 350 MG PO TABS
350.0000 mg | ORAL_TABLET | Freq: Four times a day (QID) | ORAL | Status: DC | PRN
  Administered 2011-02-07: 350 mg via ORAL
  Filled 2011-02-06: qty 1

## 2011-02-06 MED ORDER — ZOLPIDEM TARTRATE 10 MG PO TABS: 10.0000 mg | ORAL_TABLET | Freq: Every evening | ORAL | Status: DC | PRN

## 2011-02-06 MED ORDER — LACTATED RINGERS IV SOLN
INTRAVENOUS | Status: DC | PRN
  Administered 2011-02-06 (×2): via INTRAVENOUS

## 2011-02-06 MED ORDER — CEFAZOLIN SODIUM 1-5 GM-% IV SOLN
1.0000 g | Freq: Three times a day (TID) | INTRAVENOUS | Status: AC
  Administered 2011-02-06 – 2011-02-07 (×2): 1 g via INTRAVENOUS
  Filled 2011-02-06 (×2): qty 50

## 2011-02-06 MED ORDER — NITROGLYCERIN 0.4 MG SL SUBL: 0.4000 mg | SUBLINGUAL_TABLET | SUBLINGUAL | Status: DC | PRN

## 2011-02-06 MED ORDER — LIDOCAINE-EPINEPHRINE 0.5-1:200000 % IJ SOLN
INTRAMUSCULAR | Status: DC | PRN
  Administered 2011-02-06: 5 mL

## 2011-02-06 MED ORDER — HEMOSTATIC AGENTS (NO CHARGE) OPTIME
TOPICAL | Status: DC | PRN
  Administered 2011-02-06 (×2): 1 via TOPICAL

## 2011-02-06 MED ORDER — MORPHINE SULFATE 4 MG/ML IJ SOLN
1.0000 mg | INTRAMUSCULAR | Status: DC | PRN
  Administered 2011-02-06 – 2011-02-07 (×3): 4 mg via INTRAVENOUS
  Filled 2011-02-06 (×3): qty 1

## 2011-02-06 MED ORDER — DIPHENHYDRAMINE HCL 50 MG/ML IJ SOLN
INTRAMUSCULAR | Status: DC | PRN
  Administered 2011-02-06: 25 mg via INTRAVENOUS

## 2011-02-06 MED ORDER — AMLODIPINE BESYLATE 10 MG PO TABS
10.0000 mg | ORAL_TABLET | Freq: Every day | ORAL | Status: DC
  Administered 2011-02-06: 10 mg via ORAL
  Filled 2011-02-06 (×2): qty 1

## 2011-02-06 MED ORDER — EZETIMIBE-SIMVASTATIN 10-40 MG PO TABS: 1.0000 | ORAL_TABLET | Freq: Every day | ORAL | Status: DC

## 2011-02-06 SURGICAL SUPPLY — 75 items
ADH SKN CLS APL DERMABOND .7 (GAUZE/BANDAGES/DRESSINGS) ×1
BANDAGE GAUZE ELAST BULKY 4 IN (GAUZE/BANDAGES/DRESSINGS) IMPLANT
BIT DRILL NEURO 2X3.1 SFT TUCH (MISCELLANEOUS) ×1 IMPLANT
BUR DRUM 4.0 (BURR) ×1 IMPLANT
CANISTER SUCTION 2500CC (MISCELLANEOUS) ×2 IMPLANT
CLOTH BEACON ORANGE TIMEOUT ST (SAFETY) ×2 IMPLANT
CONT SPEC 4OZ CLIKSEAL STRL BL (MISCELLANEOUS) ×2 IMPLANT
DECANTER SPIKE VIAL GLASS SM (MISCELLANEOUS) ×2 IMPLANT
DERMABOND ADVANCED (GAUZE/BANDAGES/DRESSINGS) ×1
DERMABOND ADVANCED .7 DNX12 (GAUZE/BANDAGES/DRESSINGS) ×1 IMPLANT
DRAPE LAPAROTOMY 100X72 PEDS (DRAPES) ×2 IMPLANT
DRAPE MICROSCOPE LEICA (MISCELLANEOUS) ×2 IMPLANT
DRAPE POUCH INSTRU U-SHP 10X18 (DRAPES) ×2 IMPLANT
DRAPE PROXIMA HALF (DRAPES) IMPLANT
DRILL NEURO 2X3.1 SOFT TOUCH (MISCELLANEOUS) ×2
DURAPREP 6ML APPLICATOR 50/CS (WOUND CARE) ×2 IMPLANT
ELECT COATED BLADE 2.86 ST (ELECTRODE) ×2 IMPLANT
ELECT REM PT RETURN 9FT ADLT (ELECTROSURGICAL) ×2
ELECTRODE REM PT RTRN 9FT ADLT (ELECTROSURGICAL) ×1 IMPLANT
GAUZE SPONGE 4X4 16PLY XRAY LF (GAUZE/BANDAGES/DRESSINGS) IMPLANT
GLOVE BIO SURGEON STRL SZ 6.5 (GLOVE) IMPLANT
GLOVE BIO SURGEON STRL SZ7 (GLOVE) IMPLANT
GLOVE BIO SURGEON STRL SZ7.5 (GLOVE) IMPLANT
GLOVE BIO SURGEON STRL SZ8 (GLOVE) IMPLANT
GLOVE BIO SURGEON STRL SZ8.5 (GLOVE) IMPLANT
GLOVE BIOGEL M 8.0 STRL (GLOVE) IMPLANT
GLOVE BIOGEL PI IND STRL 7.5 (GLOVE) IMPLANT
GLOVE BIOGEL PI IND STRL 8.5 (GLOVE) IMPLANT
GLOVE BIOGEL PI INDICATOR 7.5 (GLOVE) ×1
GLOVE BIOGEL PI INDICATOR 8.5 (GLOVE) ×2
GLOVE ECLIPSE 6.5 STRL STRAW (GLOVE) ×2 IMPLANT
GLOVE ECLIPSE 7.0 STRL STRAW (GLOVE) IMPLANT
GLOVE ECLIPSE 7.5 STRL STRAW (GLOVE) ×2 IMPLANT
GLOVE ECLIPSE 8.0 STRL XLNG CF (GLOVE) IMPLANT
GLOVE ECLIPSE 8.5 STRL (GLOVE) IMPLANT
GLOVE EXAM NITRILE LRG STRL (GLOVE) IMPLANT
GLOVE EXAM NITRILE MD LF STRL (GLOVE) IMPLANT
GLOVE EXAM NITRILE XL STR (GLOVE) IMPLANT
GLOVE EXAM NITRILE XS STR PU (GLOVE) IMPLANT
GLOVE INDICATOR 6.5 STRL GRN (GLOVE) IMPLANT
GLOVE INDICATOR 7.0 STRL GRN (GLOVE) IMPLANT
GLOVE INDICATOR 7.5 STRL GRN (GLOVE) IMPLANT
GLOVE INDICATOR 8.0 STRL GRN (GLOVE) IMPLANT
GLOVE INDICATOR 8.5 STRL (GLOVE) IMPLANT
GLOVE OPTIFIT SS 8.0 STRL (GLOVE) IMPLANT
GLOVE OPTIFIT SS STER SZ 7 (GLOVE) IMPLANT
GLOVE SS N UNI LF 8.0 STRL (GLOVE) ×4 IMPLANT
GLOVE SURG SS PI 6.5 STRL IVOR (GLOVE) IMPLANT
GOWN BRE IMP SLV AUR LG STRL (GOWN DISPOSABLE) ×7 IMPLANT
GOWN BRE IMP SLV AUR XL STRL (GOWN DISPOSABLE) ×1 IMPLANT
GOWN STRL REIN 2XL LVL4 (GOWN DISPOSABLE) ×2 IMPLANT
GOWN W/COTTON TOWEL STD LRG (GOWNS) ×2 IMPLANT
KIT BASIN OR (CUSTOM PROCEDURE TRAY) ×2 IMPLANT
KIT ROOM TURNOVER OR (KITS) ×2 IMPLANT
NDL HYPO 25X1 1.5 SAFETY (NEEDLE) ×1 IMPLANT
NDL SPNL 22GX3.5 QUINCKE BK (NEEDLE) ×1 IMPLANT
NEEDLE HYPO 25X1 1.5 SAFETY (NEEDLE) ×2 IMPLANT
NEEDLE SPNL 22GX3.5 QUINCKE BK (NEEDLE) ×2 IMPLANT
NS IRRIG 1000ML POUR BTL (IV SOLUTION) ×2 IMPLANT
PACK LAMINECTOMY NEURO (CUSTOM PROCEDURE TRAY) ×2 IMPLANT
PAD ARMBOARD 7.5X6 YLW CONV (MISCELLANEOUS) ×5 IMPLANT
PIN DISTRACTION 14MM (PIN) ×4 IMPLANT
PLATE HELIZ-R 40MM (Plate) ×1 IMPLANT
RUBBERBAND STERILE (MISCELLANEOUS) ×4 IMPLANT
SCREW HELIX 4.0X13 (Screw) IMPLANT
SCREW HELIX 4.0X13MM (Screw) ×12 IMPLANT
SPACER ACF PARALLEL 7MM (Bone Implant) ×1 IMPLANT
SPACER PARALLEL 6MM CC ACF (Bone Implant) ×1 IMPLANT
SPONGE INTESTINAL PEANUT (DISPOSABLE) ×3 IMPLANT
SPONGE SURGIFOAM ABS GEL SZ50 (HEMOSTASIS) ×2 IMPLANT
SUT VIC AB 3-0 SH 8-18 (SUTURE) ×3 IMPLANT
SYR 20ML ECCENTRIC (SYRINGE) ×2 IMPLANT
TOWEL OR 17X24 6PK STRL BLUE (TOWEL DISPOSABLE) ×2 IMPLANT
TOWEL OR 17X26 10 PK STRL BLUE (TOWEL DISPOSABLE) ×2 IMPLANT
WATER STERILE IRR 1000ML POUR (IV SOLUTION) ×2 IMPLANT

## 2011-02-06 NOTE — Op Note (Signed)
PATIENT:  Maria Arroyo  60 y.o. female  PRE-OPERATIVE DIAGNOSIS:Cervical spondylosis with myelopathy C4/5,5/6   Cervical stenosis C4/5,5/6  POST-OPERATIVE DIAGNOSIS: Cervical spondylosis with myelopathy C4/5,5/6   Cervical stenosis C4/5,5/6   PROCEDURE:  Procedure(s): Anterior cervical decompression C4-5 C5-6. #2. Arthrodesis 6 mm structural allograft C4-5. Arthrodesis a 7 mm structural allograft C5-6. #3. Helix invasive anterior instrumentation 40 mm plate and 13 mm screws. 2 screws placed in C4, C5, and C6.   SURGEON:  Surgeon(s): Philmore Pali  PHYSICIAN ASSISTANT: Colon Branch    ANESTHESIA:   general  EBL:  Total I/O In: 1600 [I.V.:1600] Out: 100 [Blood:100]  BLOOD ADMINISTERED:none  DRAINS: none   LOCAL MEDICATIONS USED:  LIDOCAINE 5 CC  SPECIMEN:  No Specimen  Op Note: Ms. Arcilla was brought to the operating room intubated and placed under a general anesthetic without difficulty. Her head was positioned on a horseshoe headrest and neutral fashion. Her neck was prepped and draped in a sterile fashion. I infiltrated 3 cc 1/2% lidocaine. This was done at my proposed incision starting from the midline extending to the medial border of her left sternocleidomastoid muscle. I opened the skin with a #10 blade through my initial incision down to the level of the platysma. Using Metzenbaum scissors I dissected rostrally and caudally in a plane superior to the platysma. I opened the platysma in a horizontal fashion with the Metzenbaum scissors. I dissected and a plane inferior to the platysma muscle rostrally and caudally. With both sharp and blunt technique I dissected to the cervical spine creating an avascular corridor. I placed a spinal needle into the disc space and was able to confirm our location using intraoperative x-ray I reflected the longus coli muscles bilaterally and placed a self-retaining retractor. I proceeded with initial discectomies using a 15  blade to open both C4-5 and C5-6. -assisted decompress the C5-6 disc space. I decompressed the spinal canal at C5-6 using a combination of pituitary rongeurs curettes and a high-speed drill along with Kerrison punches. I was able to remove redundant posterior longitudinal ligament and expose the thecal sac. I was unable to fully decompressed both C6 nerve roots right and left. After thorough decompression of both nerve roots and spinal canal I prepared for the arthrodesis.  I sized the space and felt to a 7 mm graft would be appropriate. Prior to placing the graft I filled the cavity with hemostatic material. I then proceeded to complete the discectomy and decompression at C4-5. I did place distraction pins one at C4 the other at C5 and opened the disc space at C4-5. I completed the discectomy using pituitary rongeurs Kerrison punches a high-speed drill and pituitary rongeurs. I fully decompress the spinal canal along with the C5 nerve roots bilaterally. I proceeded to prepare for arthrodesis at C4-5.  Surgeon Jeannett Senior out the surfaces of C4 and C5. This way they would accept the graft without difficulty. I felt a 6 mm graft was appropriate and placed that without difficulty. I then placed a 7 mm graft at C56. The arthrodesis now been completed I turned to my instrumentation.  I sized the standing and felt a 40 mm Helix plate would be appropriate. I drilled continue self tapping screws and each level. I placed 13 mm screws and 2 screws at C4-2 at C5 and 2 at C6.  Intraoperative x-ray revealed the screws plate and plugs to be in good position. I was assisted by Dr. Claybon Jabs during the instrumentation phase.  Close the wound in layered fashion using Vicryl sutures to reapproximate the platysma and subcuticular layers. I used Dermabond for a sterile dressing. She was extubated and moving all extremities postoperatively.     PATIENT DISPOSITION:  PACU - hemodynamically stable.   Delay start of Pharmacological  VTE agent (>24hrs) due to surgical blood loss or risk of bleeding:  {YES/NO/NOT APPLICABLE:20182

## 2011-02-06 NOTE — Transfer of Care (Signed)
Immediate Anesthesia Transfer of Care Note  Patient: Maria Arroyo  Procedure(s) Performed:  ANTERIOR CERVICAL DECOMPRESSION/DISCECTOMY FUSION 2 LEVELS - Anterior Cervical Four-Five/Five-Six Decompression and Fusion with Plating and Bonegraft  Patient Location: PACU  Anesthesia Type: General  Level of Consciousness: awake, alert , oriented and patient cooperative  Airway & Oxygen Therapy: Patient Spontanous Breathing and Patient connected to face mask oxygen  Post-op Assessment: Report given to PACU RN, Post -op Vital signs reviewed and stable and Patient moving all extremities  Post vital signs: Reviewed and stable  Complications: No apparent anesthesia complications

## 2011-02-06 NOTE — Anesthesia Postprocedure Evaluation (Signed)
  Anesthesia Post-op Note  Patient: Maria Arroyo  Procedure(s) Performed:  ANTERIOR CERVICAL DECOMPRESSION/DISCECTOMY FUSION 2 LEVELS - Anterior Cervical Four-Five/Five-Six Decompression and Fusion with Plating and Bonegraft  Patient Location: PACU  Anesthesia Type: General  Level of Consciousness: awake, sedated and patient cooperative  Airway and Oxygen Therapy: Patient Spontanous Breathing and Patient connected to nasal cannula oxygen  Post-op Pain: mild  Post-op Assessment: Post-op Vital signs reviewed, Patient's Cardiovascular Status Stable, Respiratory Function Stable, Patent Airway and No signs of Nausea or vomiting  Post-op Vital Signs: stable  Complications: No apparent anesthesia complications

## 2011-02-06 NOTE — H&P (Signed)
Maria Arroyo is an 60 y.o. female.   Chief Complaint: Increasing numbness in arms and hands. HPI: Brian Kocourek presents with increasing numbness in her arms and hands and has been dropping objects also. Her balance is always been poor but she feels that it has progressed over the last few months. She noticed all of these things near the end of July. She also reports neck pain, left hand pain, right shoulder pain, and left shoulder pain.  Past Medical History  Diagnosis Date  . Coronary artery disease   . COPD (chronic obstructive pulmonary disease)   . Hypertension   . Hyperlipidemia   . Tobacco abuse   . OA (osteoarthritis)   . Depression     Past Surgical History  Procedure Date  . Cardiac catheterization 04/09/2009    EF 60%  . Cardiac catheterization 03/24/2001    EF 55%  . Cardiac catheterization 01/06/2001    EF 65%  . Bladder surgery   . Coronary stent placement 03/2009    STENTING OF THE PROXIMAL TO MID RIGHT CORONARY  . Abdominal hysterectomy   . Cholecystectomy     Family History  Problem Relation Age of Onset  . Aneurysm Mother   . Heart attack Father   . Heart failure Father   . Stroke Sister   . Heart attack Brother    Social History:  reports that she has been smoking.  She does not have any smokeless tobacco history on file. She reports that she does not drink alcohol or use illicit drugs.  Allergies:  Allergies  Allergen Reactions  . Ace Inhibitors Other (See Comments)    Worsening renal function   . Codeine-Guaifenesin (Tusso-C)     Medications Prior to Admission  Medication Dose Route Frequency Provider Last Rate Last Dose  . ceFAZolin (ANCEF) IVPB 2 g/50 mL premix  2 g Intravenous Once Analiya Porco L Nelissa Bolduc       Medications Prior to Admission  Medication Sig Dispense Refill  . amLODipine (NORVASC) 10 MG tablet Take 10 mg by mouth daily.       . Calcium Carbonate-Vitamin D (CALCIUM 600 + D PO) Take 1 tablet by mouth daily.       .  carisoprodol (SOMA) 350 MG tablet Take 350 mg by mouth 4 (four) times daily as needed. For muscle spasms      . Cholecalciferol (VITAMIN D) 2000 UNITS tablet Take 4,000 Units by mouth daily.       . CYMBALTA 60 MG capsule Take 60 mg by mouth Daily.       . Multiple Vitamin (MULTIVITAMIN) tablet Take 1 tablet by mouth daily.       [name] oxyCODONE-acetaminophen (PERCOCET) 10-325 MG per tablet Take 1 tablet by mouth every 4 (four) hours as needed. For pain      . temazepam (RESTORIL) 30 MG capsule Take 30 mg by mouth at bedtime as needed. For sleep      . aspirin 325 MG tablet Take 325 mg by mouth daily.       . clopidogrel (PLAVIX) 75 MG tablet Take 1 tablet (75 mg total) by mouth daily.  30 tablet  5  . nitroGLYCERIN (NITROSTAT) 0.4 MG SL tablet Place 0.4 mg under the tongue every 5 (five) minutes as needed. For chest pain         Results for orders placed during the hospital encounter of 02/05/11 (from the past 48 hour(s))  BASIC METABOLIC PANEL     Status: Abnormal  Collection Time   02/05/11  3:20 PM      Component Value Range Comment   Sodium 140  135 - 145 (mEq/L)    Potassium 3.8  3.5 - 5.1 (mEq/L)    Chloride 98  96 - 112 (mEq/L)    CO2 33 (*) 19 - 32 (mEq/L)    Glucose, Bld 107 (*) 70 - 99 (mg/dL)    BUN 9  6 - 23 (mg/dL)    Creatinine, Ser 1.19  0.50 - 1.10 (mg/dL)    Calcium 9.5  8.4 - 10.5 (mg/dL)    GFR calc non Af Amer >90  >90 (mL/min)    GFR calc Af Amer >90  >90 (mL/min)   CBC     Status: Abnormal   Collection Time   02/05/11  3:20 PM      Component Value Range Comment   WBC 11.0 (*) 4.0 - 10.5 (K/uL)    RBC 4.48  3.87 - 5.11 (MIL/uL)    Hemoglobin 14.5  12.0 - 15.0 (g/dL)    HCT 14.7  82.9 - 56.2 (%)    MCV 94.6  78.0 - 100.0 (fL)    MCH 32.4  26.0 - 34.0 (pg)    MCHC 34.2  30.0 - 36.0 (g/dL)    RDW 13.0  86.5 - 78.4 (%)    Platelets 272  150 - 400 (K/uL)   SURGICAL PCR SCREEN     Status: Normal   Collection Time   02/05/11  3:25 PM      Component Value Range  Comment   MRSA, PCR NEGATIVE  NEGATIVE     Staphylococcus aureus NEGATIVE  NEGATIVE     Dg Chest 2 View  02/05/2011  *RADIOLOGY REPORT*  Clinical Data: Preoperative evaluation.  History of hypertension, smoking, and COPD.  CHEST - 2 VIEW  Comparison: 04/05/2009.  Findings: There is borderline cardiac size.  Mediastinal and hilar contours appear stable.  Lungs are free of infiltrates or nodules. No pleural abnormality is evident.  Changes of degenerative disc disease and degenerative spondylosis are seen.  There is slight flattening of diaphragm on lateral image consistent with minimal hyperinflation configuration.  IMPRESSION: Borderline cardiac size.  Minimal hyperinflation configuration.  No acute superimposed abnormality is identified.  Original Report Authenticated By: Crawford Givens, M.D.    Review of Systems  Constitutional: Negative.   HENT: Positive for neck pain.   Eyes: Positive for blurred vision.  Respiratory: Negative.   Cardiovascular: Negative.   Gastrointestinal: Negative.   Genitourinary: Negative.   Musculoskeletal: Positive for back pain.       Difficulty with coordination  Skin: Negative.   Neurological: Positive for sensory change.       Facial weakness  Endo/Heme/Allergies: Negative.   Psychiatric/Behavioral: Negative.     Blood pressure 133/83, pulse 62, temperature 98.2 F (36.8 C), temperature source Oral, resp. rate 18, SpO2 94.00%. Physical Exam  Vitals reviewed. Constitutional: She is oriented to person, place, and time. She appears well-developed and well-nourished.  HENT:  Head: Normocephalic.  Eyes: EOM are normal. Pupils are equal, round, and reactive to light.  Neck: Normal range of motion.  Cardiovascular: Normal rate and regular rhythm.   Respiratory: Effort normal and breath sounds normal.  Neurological: She is alert and oriented to person, place, and time. She has normal strength. Coordination and gait abnormal.  Reflex Scores:      Tricep  reflexes are 3+ on the right side and 3+ on the left side.  Bicep reflexes are 3+ on the right side and 3+ on the left side.      Brachioradialis reflexes are 3+ on the right side and 3+ on the left side.      Patellar reflexes are 3+ on the right side and 3+ on the left side.      Achilles reflexes are 3+ on the right side and 3+ on the left side. Skin: Skin is warm and dry.  Psychiatric: She has a normal mood and affect.     Assessment/Plan Ms. Buccellato is myelopathic. MRI shows cervical stenosis at C4-5 and C5-6. She has cord signal behind the C4-5 disc space. She will be taken to the operating room for an anterior cervical decompression.  Nickoles Gregori L 02/06/2011, 11:52 AM

## 2011-02-06 NOTE — Progress Notes (Signed)
BP 158/77  Pulse 66  Temp(Src) 98.9 F (37.2 C) (Oral)  Resp 15  SpO2 90%  Alert, oriented x4. Voice strong Wound clean dry no signs of infection. 5/5 strength all extremities. Doing well post op

## 2011-02-06 NOTE — Anesthesia Preprocedure Evaluation (Addendum)
Anesthesia Evaluation  Patient identified by MRN, date of birth, ID band Patient awake    Reviewed: Allergy & Precautions, H&P , NPO status , Patient's Chart, lab work & pertinent test results, reviewed documented beta blocker date and time   Airway Mallampati: II TM Distance: <3 FB Neck ROM: full    Dental  (+) Teeth Intact and Dental Advisory Given   Pulmonary COPD         Cardiovascular Exercise Tolerance: Poor hypertension, Pt. on medications + Cardiac Stents (Stents x 4 on plavix, last dose 02/01/11) regular Normal    Neuro/Psych PSYCHIATRIC DISORDERS Anxiety Depression    GI/Hepatic negative GI ROS, Neg liver ROS,   Endo/Other    Renal/GU      Musculoskeletal   Abdominal   Peds  Hematology negative hematology ROS (+)   Anesthesia Other Findings   Reproductive/Obstetrics                        Anesthesia Physical Anesthesia Plan  ASA: III  Anesthesia Plan: General   Post-op Pain Management:    Induction: Intravenous  Airway Management Planned: Oral ETT  Additional Equipment:   Intra-op Plan:   Post-operative Plan: Extubation in OR  Informed Consent: I have reviewed the patients History and Physical, chart, labs and discussed the procedure including the risks, benefits and alternatives for the proposed anesthesia with the patient or authorized representative who has indicated his/her understanding and acceptance.   Dental advisory given  Plan Discussed with: CRNA, Anesthesiologist and Surgeon  Anesthesia Plan Comments:        Anesthesia Quick Evaluation

## 2011-02-06 NOTE — Preoperative (Signed)
Beta Blockers   Reason not to administer Beta Blockers:Not Applicable 

## 2011-02-06 NOTE — Anesthesia Procedure Notes (Addendum)
Procedure Name: Intubation Date/Time: 02/06/2011 12:50 PM Performed by: Einar Crow Pre-anesthesia Checklist: Patient identified, Emergency Drugs available, Suction available and Patient being monitored Patient Re-evaluated:Patient Re-evaluated prior to inductionOxygen Delivery Method: Circle System Utilized Preoxygenation: Pre-oxygenation with 100% oxygen Intubation Type: IV induction Ventilation: Oral airway inserted - appropriate to patient size and Mask ventilation without difficulty Laryngoscope Size: Mac and 3 Grade View: Grade I Tube type: Oral Tube size: 7.5 mm Number of attempts: 1 Airway Equipment and Method: stylet Placement Confirmation: ETT inserted through vocal cords under direct vision,  breath sounds checked- equal and bilateral and positive ETCO2 Secured at: 21 cm Tube secured with: Tape Dental Injury: Teeth and Oropharynx as per pre-operative assessment

## 2011-02-07 NOTE — Discharge Summary (Signed)
  Physician Discharge Summary  Patient ID: Maria Arroyo MRN: [id number] DOB/AGE: Sep 15, 1950 60 y.o.  Admit date: 02/06/2011 Discharge date: 02/07/2011  Admission Diagnoses:Cervical spondylosis with myelopathy C4/5,5/6  Cervical stenosis C4/5,5/6   Discharge Diagnoses: Cervical spondylosis with myelopathy C4/5,5/6  Cervical stenosis C4/5,5/6  Active Problems:  * No active hospital problems. *    Discharged Condition: good  Hospital Course: Pt admitted day of surgery - underwenrt  Surgery - transferred to RR then floor. Pt up ambulating - eating well, voice is ok, incision C/D/I, has less arm pain - D/C home  Consults: none  Treatments: surgery: Procedure(s): Anterior cervical decompression C4-5 C5-6. #2. Arthrodesis 6 mm structural allograft C4-5. Arthrodesis a 7 mm structural allograft C5-6. #3. Helix invasive anterior instrumentation 40 mm plate and 13 mm screws. 2 screws placed in C4, C5, and C6.   Discharge Exam: Blood pressure 133/71, pulse 60, temperature 98 F (36.7 C), temperature source Oral, resp. rate 12, SpO2 93.00%.    Disposition: home  Discharge Orders    Future Orders Please Complete By Expires   Diet general      Increase activity slowly      Discharge instructions      Comments:   May shower tomorrow. Pat wound dry. Call if wound drains, becomes red and/or tender Call if you have great difficulty swallowing Call if you feel short of breath   No dressing needed      Call MD for:  severe uncontrolled pain      Call MD for:  redness, tenderness, or signs of infection (pain, swelling, redness, odor or green/yellow discharge around incision site)      Call MD for:  difficulty breathing, headache or visual disturbances      Call MD for:  temperature >100.4        Current Discharge Medication List    CONTINUE these medications which have NOT CHANGED   Details  amLODipine (NORVASC) 10 MG tablet Take 10 mg by mouth daily.     Calcium  Carbonate-Vitamin D (CALCIUM 600 + D PO) Take 1 tablet by mouth daily.     carisoprodol (SOMA) 350 MG tablet Take 350 mg by mouth 4 (four) times daily as needed. For muscle spasms    Cholecalciferol (VITAMIN D) 2000 UNITS tablet Take 4,000 Units by mouth daily.     CYMBALTA 60 MG capsule Take 60 mg by mouth Daily.     ezetimibe-simvastatin (VYTORIN) 10-40 MG per tablet Take 1 tablet by mouth at bedtime.      Multiple Vitamin (MULTIVITAMIN) tablet Take 1 tablet by mouth daily.     oxyCODONE-acetaminophen (PERCOCET) 10-325 MG per tablet Take 1 tablet by mouth every 4 (four) hours as needed. For pain    temazepam (RESTORIL) 30 MG capsule Take 30 mg by mouth at bedtime as needed. For sleep    torsemide (DEMADEX) 20 MG tablet Take 20 mg by mouth daily.      aspirin 325 MG tablet Take 325 mg by mouth daily.     clopidogrel (PLAVIX) 75 MG tablet Take 1 tablet (75 mg total) by mouth daily. Qty: 30 tablet, Refills: 5    nitroGLYCERIN (NITROSTAT) 0.4 MG SL tablet Place 0.4 mg under the tongue every 5 (five) minutes as needed. For chest pain          Signed: Clydene Fake, MD 02/07/2011, 7:37 AM

## 2011-02-10 ENCOUNTER — Encounter (HOSPITAL_COMMUNITY): Payer: Self-pay | Admitting: Neurosurgery

## 2011-04-01 ENCOUNTER — Other Ambulatory Visit: Payer: Self-pay | Admitting: Cardiology

## 2011-10-05 ENCOUNTER — Other Ambulatory Visit: Payer: Self-pay | Admitting: Cardiology

## 2011-12-18 ENCOUNTER — Encounter: Payer: Self-pay | Admitting: Cardiology

## 2011-12-18 ENCOUNTER — Other Ambulatory Visit: Payer: Self-pay | Admitting: Cardiology

## 2011-12-18 ENCOUNTER — Ambulatory Visit (INDEPENDENT_AMBULATORY_CARE_PROVIDER_SITE_OTHER): Payer: No Typology Code available for payment source | Admitting: Cardiology

## 2011-12-18 VITALS — BP 122/82 | HR 69 | Ht 61.5 in | Wt 197.8 lb

## 2011-12-18 DIAGNOSIS — E785 Hyperlipidemia, unspecified: Secondary | ICD-10-CM

## 2011-12-18 DIAGNOSIS — Z720 Tobacco use: Secondary | ICD-10-CM

## 2011-12-18 DIAGNOSIS — I10 Essential (primary) hypertension: Secondary | ICD-10-CM

## 2011-12-18 DIAGNOSIS — I200 Unstable angina: Secondary | ICD-10-CM

## 2011-12-18 DIAGNOSIS — F17200 Nicotine dependence, unspecified, uncomplicated: Secondary | ICD-10-CM

## 2011-12-18 DIAGNOSIS — I2510 Atherosclerotic heart disease of native coronary artery without angina pectoris: Secondary | ICD-10-CM

## 2011-12-18 LAB — PROTIME-INR: Prothrombin Time: 12.1 seconds (ref 11.6–15.2)

## 2011-12-18 LAB — CBC WITH DIFFERENTIAL/PLATELET
Eosinophils Absolute: 0.2 10*3/uL (ref 0.0–0.7)
Eosinophils Relative: 2 % (ref 0–5)
HCT: 43.7 % (ref 36.0–46.0)
Hemoglobin: 14.7 g/dL (ref 12.0–15.0)
Lymphs Abs: 2.6 10*3/uL (ref 0.7–4.0)
MCH: 31.5 pg (ref 26.0–34.0)
MCHC: 33.6 g/dL (ref 30.0–36.0)
MCV: 93.8 fL (ref 78.0–100.0)
Monocytes Absolute: 0.5 10*3/uL (ref 0.1–1.0)
Monocytes Relative: 6 % (ref 3–12)
Neutrophils Relative %: 61 % (ref 43–77)
RBC: 4.66 MIL/uL (ref 3.87–5.11)

## 2011-12-18 MED ORDER — NITROGLYCERIN 0.4 MG SL SUBL: 0.4000 mg | SUBLINGUAL_TABLET | SUBLINGUAL | Status: DC | PRN

## 2011-12-18 NOTE — Progress Notes (Signed)
Maria Arroyo Date of Birth: July 21, 1950   History of Present Illness: Maria Arroyo is seen today for followup. She has a history of coronary disease and is status post stenting of the midright coronary in December of 1999 with a 3.0 x 25 mm NIR Primo stent. The proximal right coronary was stented in October of 2002 with a 3.0 x 23 mm Zeta stent. In January of 2011 she had a new high-grade stenosis in the mid right coronary between the prior stents and this was stented using a 3.0 x 18 mm Promus stent. On presentation today she complains that over the past 2 months she has had a marked increase in dyspnea on exertion, fatigue, and chest pain. Her chest pain is substernal and radiates down her left arm. It is relieved with rest and nitroglycerin. She states now she can hardly walk across the room without having to stop and sit down. Patient does have multiple cardiac risk factors including hyperlipidemia and ongoing tobacco abuse. She has a history of hypertension and family history of coronary disease. Her brother just recently died 3 months ago with myocardial infarction. She is on chronic dual antiplatelet therapy.  Current Outpatient Prescriptions on File Prior to Visit  Medication Sig Dispense Refill  . amLODipine (NORVASC) 10 MG tablet Take 10 mg by mouth daily.       [name] aspirin 325 MG tablet Take 325 mg by mouth daily.       . Calcium Carbonate-Vitamin D (CALCIUM 600 + D PO) Take 1 tablet by mouth daily.       . carisoprodol (SOMA) 350 MG tablet Take 350 mg by mouth 4 (four) times daily as needed. For muscle spasms      . Cholecalciferol (VITAMIN D) 2000 UNITS tablet Take 4,000 Units by mouth daily.       . clopidogrel (PLAVIX) 75 MG tablet TAKE 1 TABLET BY MOUTH EVERY DAY  30 tablet  5  . CYMBALTA 60 MG capsule Take 60 mg by mouth Daily.       [name] ezetimibe-simvastatin (VYTORIN) 10-40 MG per tablet Take 1 tablet by mouth at bedtime.        . gabapentin (NEURONTIN) 600 MG tablet Take 600  mg by mouth as directed.       . labetalol (NORMODYNE) 100 MG tablet Take 100 mg by mouth as directed.       . Multiple Vitamin (MULTIVITAMIN) tablet Take 1 tablet by mouth daily.       [name] NIASPAN 500 MG CR tablet Take 500 mg by mouth as directed.       [name] oxyCODONE-acetaminophen (PERCOCET) 10-325 MG per tablet Take 1 tablet by mouth every 4 (four) hours as needed. For pain      . temazepam (RESTORIL) 30 MG capsule Take 30 mg by mouth at bedtime as needed. For sleep      . torsemide (DEMADEX) 20 MG tablet Take 20 mg by mouth daily.        [name] DISCONTD: nitroGLYCERIN (NITROSTAT) 0.4 MG SL tablet Place 0.4 mg under the tongue every 5 (five) minutes as needed. For chest pain         Allergies  Allergen Reactions  . Ace Inhibitors Other (See Comments)    Worsening renal function   . Codeine-Guaifenesin (Guaifenesin-Codeine)     Some nausea--but ok with percocet    Past Medical History  Diagnosis Date  . Coronary artery disease   . COPD (chronic obstructive pulmonary disease)   .  Hypertension   . Hyperlipidemia   . Tobacco abuse   . OA (osteoarthritis)   . Depression   . Neuropathy   . Cervical disc disease     Past Surgical History  Procedure Date  . Cardiac catheterization 04/09/2009    EF 60%  . Cardiac catheterization 03/24/2001    EF 55%  . Cardiac catheterization 01/06/2001    EF 65%  . Bladder surgery   . Coronary stent placement 03/2009    STENTING OF THE PROXIMAL TO MID RIGHT CORONARY  . Abdominal hysterectomy   . Cholecystectomy   . Anterior cervical decomp/discectomy fusion 02/06/2011    Procedure: ANTERIOR CERVICAL DECOMPRESSION/DISCECTOMY FUSION 2 LEVELS;  Surgeon: Carmela Hurt;  Location: [facility];  Service: Neurosurgery;  Laterality: N/A;  Anterior Cervical Four-Five/Five-Six Decompression and Fusion with Plating and Bonegraft    History  Smoking status  . Current Every Day Smoker -- 0.5 packs/day  Smokeless tobacco  . Not on file  Comment: has cut back  on smoking / down to 1/2 pk daily    History  Alcohol Use No    Family History  Problem Relation Age of Onset  . Aneurysm Mother   . Heart attack Father   . Heart failure Father   . Stroke Sister   . Heart attack Brother     Review of Systems: The review of systems is positive for chronic depression. She complains that her hands stay hot, red, and non-. This affects her feet to a lesser extent. In November of 2012 she underwent neck surgery but she did not notice any improvement in her neuropathic symptoms. She has been evaluated by Dr. Anne Hahn with neurology who prescribed gabapentin. She reports that on her last lipid panel her triglycerides were over 400 and she was started on niacin. All other systems were reviewed and are negative.  Physical Exam: BP 122/82  Pulse 69  Ht 5' 1.5" (1.562 m)  Wt 197 lb 12.8 oz (89.721 kg)  BMI 36.77 kg/m2  SpO2 93% She is a chronically ill-appearing and obese white female in no acute distress. Her mood is tearful and depressed. She is normocephalic, atraumatic. Pupils are equal round and reactive light accommodation. Extraocular movements are full. Oropharynx is clear. She has no JVD, adenopathy, thyromegaly, or bruits. Lungs are clear. Cardiac exam reveals a regular rate and rhythm without gallop, murmur, or click. Abdomen is obese, soft, nontender. There are no masses or bruits. Bowel sounds are positive. She has no significant edema. Pedal pulses are palpable. Radial pulses are good. She is alert and oriented x3. Cranial nerves II through XII are intact. LABORATORY DATA: ECG demonstrates normal sinus rhythm with a normal ECG  Assessment / Plan: 1. Progressive angina pectoris in a patient with known coronary disease status post multiple stenting procedures to the right coronary. I recommended proceeding directly with cardiac catheterization with potential intervention if needed. This will be scheduled for Monday, September 23. The procedure and risk  were reviewed in detail with the patient. We will attempt a radial approach.  2. COPD with ongoing tobacco abuse.  3. Tobacco abuse. Patient counseled on the importance of smoking cessation.  4. Hyperlipidemia, combined.  5. Hypertension.  6. Major depression.

## 2011-12-18 NOTE — Patient Instructions (Signed)
Reduce ASA to 81 mg daily.  We will schedule you for a cardiac cath next week.  We called in your prescription for Ntg.

## 2011-12-19 LAB — BASIC METABOLIC PANEL
CO2: 29 mEq/L (ref 19–32)
Chloride: 100 mEq/L (ref 96–112)
Potassium: 4.2 mEq/L (ref 3.5–5.3)
Sodium: 140 mEq/L (ref 135–145)

## 2011-12-21 ENCOUNTER — Ambulatory Visit (HOSPITAL_COMMUNITY)
Admission: RE | Admit: 2011-12-21 | Discharge: 2011-12-21 | Disposition: A | Discharge status: Home / Self Care | Payer: No Typology Code available for payment source | Source: Ambulatory Visit | Attending: Cardiology | Admitting: Cardiology

## 2011-12-21 ENCOUNTER — Encounter (HOSPITAL_COMMUNITY)
Admission: RE | Disposition: A | Discharge status: Home / Self Care | Payer: Self-pay | Source: Ambulatory Visit | Attending: Cardiology

## 2011-12-21 DIAGNOSIS — I2510 Atherosclerotic heart disease of native coronary artery without angina pectoris: Secondary | ICD-10-CM

## 2011-12-21 DIAGNOSIS — I200 Unstable angina: Secondary | ICD-10-CM

## 2011-12-21 DIAGNOSIS — F17200 Nicotine dependence, unspecified, uncomplicated: Secondary | ICD-10-CM | POA: Insufficient documentation

## 2011-12-21 DIAGNOSIS — J4489 Other specified chronic obstructive pulmonary disease: Secondary | ICD-10-CM | POA: Insufficient documentation

## 2011-12-21 DIAGNOSIS — Z9861 Coronary angioplasty status: Secondary | ICD-10-CM | POA: Insufficient documentation

## 2011-12-21 DIAGNOSIS — Z7982 Long term (current) use of aspirin: Secondary | ICD-10-CM | POA: Insufficient documentation

## 2011-12-21 DIAGNOSIS — E785 Hyperlipidemia, unspecified: Secondary | ICD-10-CM | POA: Insufficient documentation

## 2011-12-21 DIAGNOSIS — I10 Essential (primary) hypertension: Secondary | ICD-10-CM | POA: Insufficient documentation

## 2011-12-21 DIAGNOSIS — J449 Chronic obstructive pulmonary disease, unspecified: Secondary | ICD-10-CM | POA: Insufficient documentation

## 2011-12-21 DIAGNOSIS — Z79899 Other long term (current) drug therapy: Secondary | ICD-10-CM | POA: Insufficient documentation

## 2011-12-21 HISTORY — PX: LEFT HEART CATHETERIZATION WITH CORONARY ANGIOGRAM: SHX5451

## 2011-12-21 SURGERY — LEFT HEART CATHETERIZATION WITH CORONARY ANGIOGRAM

## 2011-12-21 MED ORDER — LIDOCAINE HCL (PF) 1 % IJ SOLN
INTRAMUSCULAR | Status: AC
  Filled 2011-12-21: qty 30

## 2011-12-21 MED ORDER — ACETAMINOPHEN 325 MG PO TABS: 650.0000 mg | ORAL_TABLET | ORAL | Status: DC | PRN

## 2011-12-21 MED ORDER — NITROGLYCERIN 0.2 MG/ML ON CALL CATH LAB
INTRAVENOUS | Status: AC
  Filled 2011-12-21: qty 1

## 2011-12-21 MED ORDER — DIAZEPAM 5 MG PO TABS
5.0000 mg | ORAL_TABLET | ORAL | Status: AC
  Administered 2011-12-21: 5 mg via ORAL
  Filled 2011-12-21: qty 1

## 2011-12-21 MED ORDER — FENTANYL CITRATE 0.05 MG/ML IJ SOLN
INTRAMUSCULAR | Status: AC
  Filled 2011-12-21: qty 2

## 2011-12-21 MED ORDER — SODIUM CHLORIDE 0.9 % IJ SOLN: 3.0000 mL | Freq: Two times a day (BID) | INTRAMUSCULAR | Status: DC

## 2011-12-21 MED ORDER — HEPARIN SODIUM (PORCINE) 1000 UNIT/ML IJ SOLN
INTRAMUSCULAR | Status: AC
  Filled 2011-12-21: qty 1

## 2011-12-21 MED ORDER — VERAPAMIL HCL 2.5 MG/ML IV SOLN
INTRAVENOUS | Status: AC
  Filled 2011-12-21: qty 2

## 2011-12-21 MED ORDER — SODIUM CHLORIDE 0.9 % IV SOLN
INTRAVENOUS | Status: DC
  Administered 2011-12-21: 08:00:00 via INTRAVENOUS

## 2011-12-21 MED ORDER — SODIUM CHLORIDE 0.9 % IV SOLN: 1.0000 mL/kg/h | INTRAVENOUS | Status: DC

## 2011-12-21 MED ORDER — MIDAZOLAM HCL 2 MG/2ML IJ SOLN
INTRAMUSCULAR | Status: AC
  Filled 2011-12-21: qty 2

## 2011-12-21 MED ORDER — SODIUM CHLORIDE 0.9 % IJ SOLN: 3.0000 mL | INTRAMUSCULAR | Status: DC | PRN

## 2011-12-21 MED ORDER — CLOPIDOGREL BISULFATE 75 MG PO TABS: 75.0000 mg | ORAL_TABLET | ORAL | Status: DC

## 2011-12-21 MED ORDER — ASPIRIN 81 MG PO CHEW
324.0000 mg | CHEWABLE_TABLET | ORAL | Status: AC
  Administered 2011-12-21: 324 mg via ORAL
  Filled 2011-12-21: qty 4

## 2011-12-21 MED ORDER — ONDANSETRON HCL 4 MG/2ML IJ SOLN: 4.0000 mg | Freq: Four times a day (QID) | INTRAMUSCULAR | Status: DC | PRN

## 2011-12-21 MED ORDER — SODIUM CHLORIDE 0.9 % IV SOLN
250.0000 mL | INTRAVENOUS | Status: DC | PRN
Start: 2011-12-21 — End: 2011-12-21

## 2011-12-21 MED ORDER — HEPARIN (PORCINE) IN NACL 2-0.9 UNIT/ML-% IJ SOLN
INTRAMUSCULAR | Status: AC
  Filled 2011-12-21: qty 1000

## 2011-12-21 NOTE — Interval H&P Note (Signed)
History and Physical Interval Note:  12/21/2011 9:10 AM  Maria Arroyo  has presented today for surgery, with the diagnosis of cad  The various methods of treatment have been discussed with the patient and family. After consideration of risks, benefits and other options for treatment, the patient has consented to  Procedure(s) (LRB) with comments: LEFT HEART CATHETERIZATION WITH CORONARY ANGIOGRAM (N/A) as a surgical intervention .  The patient's history has been reviewed, patient examined, no change in status, stable for surgery.  I have reviewed the patient's chart and labs.  Questions were answered to the patient's satisfaction.     Theron Arista [facility] 12/21/2011 9:10 AM

## 2011-12-21 NOTE — CV Procedure (Signed)
   Cardiac Catheterization Procedure Note  Name: Maria Arroyo MRN: [id number] DOB: 11-22-1950  Procedure: Left Heart Cath, Selective Coronary Angiography, LV angiography  Indication: 61 year old white female with coronary disease status post multiple stent procedures of the right coronary presents with symptoms of progressive dyspnea on exertion associated with chest pain.   Procedural Details: The right wrist was prepped, draped, and anesthetized with 1% lidocaine. Using the modified Seldinger technique, a 5 French sheath was introduced into the right radial artery. 3 mg of verapamil was administered through the sheath, weight-based unfractionated heparin was administered intravenously. Standard Judkins catheters were used for selective coronary angiography and left ventriculography. Catheter exchanges were performed over an exchange length guidewire. There were no immediate procedural complications. A TR band was used for radial hemostasis at the completion of the procedure.  The patient was transferred to the post catheterization recovery area for further monitoring.  Procedural Findings: Hemodynamics: AO 156/75 with a mean of 103 mmHg LV 162/26 mmHg  Coronary angiography: Coronary dominance: right  Left mainstem: Normal.  Left anterior descending (LAD): Minor irregularities in the mid vessel up to 20%.  There is a moderately large ramus intermediate branch which is normal.  Left circumflex (LCx): Minor irregularities less than 10%. The left circumflex gives rise to 2 marginal branches.  Right coronary artery (RCA): The right coronary has extensive stents from the proximal to mid vessel. The proximal vessel is widely patent area to in the mid vessel there is diffuse 50% disease of the more distal stent. There is 60% narrowing at the distal stent margin.  Left ventriculography: Left ventricular systolic function is normal, LVEF is estimated at 55-65%, there is no significant  mitral regurgitation   Final Conclusions:    1. Nonobstructive coronary disease. There is modest disease throughout the mid RCA that is unchanged compared to prior cardiac catheterization in 2011. The remainder of the stents are widely patent. 2. Normal left ventricular function.   Recommendations: Recommend continued medical therapy.  Theron Arista [facility] 12/21/2011, 9:59 AM

## 2011-12-21 NOTE — H&P (View-Only) (Signed)
 Maria Arroyo Date of Birth: 10/27/1950   History of Present Illness: Maria Arroyo is seen today for followup. She has a history of coronary disease and is status post stenting of the midright coronary in December of 1999 with a 3.0 x 25 mm NIR Primo stent. The proximal right coronary was stented in October of 2002 with a 3.0 x 23 mm Zeta stent. In January of 2011 she had a new high-grade stenosis in the mid right coronary between the prior stents and this was stented using a 3.0 x 18 mm Promus stent. On presentation today she complains that over the past 2 months she has had a marked increase in dyspnea on exertion, fatigue, and chest pain. Her chest pain is substernal and radiates down her left arm. It is relieved with rest and nitroglycerin. She states now she can hardly walk across the room without having to stop and sit down. Patient does have multiple cardiac risk factors including hyperlipidemia and ongoing tobacco abuse. She has a history of hypertension and family history of coronary disease. Her brother just recently died 3 months ago with myocardial infarction. She is on chronic dual antiplatelet therapy.  Current Outpatient Prescriptions on File Prior to Visit  Medication Sig Dispense Refill  . amLODipine (NORVASC) 10 MG tablet Take 10 mg by mouth daily.       . aspirin 325 MG tablet Take 325 mg by mouth daily.       . Calcium Carbonate-Vitamin D (CALCIUM 600 + D PO) Take 1 tablet by mouth daily.       . carisoprodol (SOMA) 350 MG tablet Take 350 mg by mouth 4 (four) times daily as needed. For muscle spasms      . Cholecalciferol (VITAMIN D) 2000 UNITS tablet Take 4,000 Units by mouth daily.       . clopidogrel (PLAVIX) 75 MG tablet TAKE 1 TABLET BY MOUTH EVERY DAY  30 tablet  5  . CYMBALTA 60 MG capsule Take 60 mg by mouth Daily.       . ezetimibe-simvastatin (VYTORIN) 10-40 MG per tablet Take 1 tablet by mouth at bedtime.        . gabapentin (NEURONTIN) 600 MG tablet Take 600  mg by mouth as directed.       . labetalol (NORMODYNE) 100 MG tablet Take 100 mg by mouth as directed.       . Multiple Vitamin (MULTIVITAMIN) tablet Take 1 tablet by mouth daily.       . NIASPAN 500 MG CR tablet Take 500 mg by mouth as directed.       . oxyCODONE-acetaminophen (PERCOCET) 10-325 MG per tablet Take 1 tablet by mouth every 4 (four) hours as needed. For pain      . temazepam (RESTORIL) 30 MG capsule Take 30 mg by mouth at bedtime as needed. For sleep      . torsemide (DEMADEX) 20 MG tablet Take 20 mg by mouth daily.        . DISCONTD: nitroGLYCERIN (NITROSTAT) 0.4 MG SL tablet Place 0.4 mg under the tongue every 5 (five) minutes as needed. For chest pain         Allergies  Allergen Reactions  . Ace Inhibitors Other (See Comments)    Worsening renal function   . Codeine-Guaifenesin (Guaifenesin-Codeine)     Some nausea--but ok with percocet    Past Medical History  Diagnosis Date  . Coronary artery disease   . COPD (chronic obstructive pulmonary disease)   .   Hypertension   . Hyperlipidemia   . Tobacco abuse   . OA (osteoarthritis)   . Depression   . Neuropathy   . Cervical disc disease     Past Surgical History  Procedure Date  . Cardiac catheterization 04/09/2009    EF 60%  . Cardiac catheterization 03/24/2001    EF 55%  . Cardiac catheterization 01/06/2001    EF 65%  . Bladder surgery   . Coronary stent placement 03/2009    STENTING OF THE PROXIMAL TO MID RIGHT CORONARY  . Abdominal hysterectomy   . Cholecystectomy   . Anterior cervical decomp/discectomy fusion 02/06/2011    Procedure: ANTERIOR CERVICAL DECOMPRESSION/DISCECTOMY FUSION 2 LEVELS;  Surgeon: Kyle L Cabbell;  Location: [facility];  Service: Neurosurgery;  Laterality: N/A;  Anterior Cervical Four-Five/Five-Six Decompression and Fusion with Plating and Bonegraft    History  Smoking status  . Current Every Day Smoker -- 0.5 packs/day  Smokeless tobacco  . Not on file  Comment: has cut back  on smoking / down to 1/2 pk daily    History  Alcohol Use No    Family History  Problem Relation Age of Onset  . Aneurysm Mother   . Heart attack Father   . Heart failure Father   . Stroke Sister   . Heart attack Brother     Review of Systems: The review of systems is positive for chronic depression. She complains that her hands stay hot, red, and non-. This affects her feet to a lesser extent. In November of 2012 she underwent neck surgery but she did not notice any improvement in her neuropathic symptoms. She has been evaluated by Dr. Willis with neurology who prescribed gabapentin. She reports that on her last lipid panel her triglycerides were over 400 and she was started on niacin. All other systems were reviewed and are negative.  Physical Exam: BP 122/82  Pulse 69  Ht 5' 1.5" (1.562 m)  Wt 197 lb 12.8 oz (89.721 kg)  BMI 36.77 kg/m2  SpO2 93% She is a chronically ill-appearing and obese white female in no acute distress. Her mood is tearful and depressed. She is normocephalic, atraumatic. Pupils are equal round and reactive light accommodation. Extraocular movements are full. Oropharynx is clear. She has no JVD, adenopathy, thyromegaly, or bruits. Lungs are clear. Cardiac exam reveals a regular rate and rhythm without gallop, murmur, or click. Abdomen is obese, soft, nontender. There are no masses or bruits. Bowel sounds are positive. She has no significant edema. Pedal pulses are palpable. Radial pulses are good. She is alert and oriented x3. Cranial nerves II through XII are intact. LABORATORY DATA: ECG demonstrates normal sinus rhythm with a normal ECG  Assessment / Plan: 1. Progressive angina pectoris in a patient with known coronary disease status post multiple stenting procedures to the right coronary. I recommended proceeding directly with cardiac catheterization with potential intervention if needed. This will be scheduled for Monday, September 23. The procedure and risk  were reviewed in detail with the patient. We will attempt a radial approach.  2. COPD with ongoing tobacco abuse.  3. Tobacco abuse. Patient counseled on the importance of smoking cessation.  4. Hyperlipidemia, combined.  5. Hypertension.  6. Major depression. 

## 2011-12-22 ENCOUNTER — Telehealth: Payer: Self-pay

## 2011-12-22 NOTE — Telephone Encounter (Signed)
Patient called appointment scheduled 12/30/11  with Norma Fredrickson NP to follow up 12/21/11 cardiac cath.

## 2011-12-30 ENCOUNTER — Encounter: Payer: No Typology Code available for payment source | Admitting: Nurse Practitioner

## 2012-01-01 ENCOUNTER — Encounter: Payer: Self-pay | Admitting: Cardiology

## 2012-01-20 NOTE — Addendum Note (Signed)
Addended by: Marrion Coy L on: 01/20/2012 11:28 AM   Modules accepted: Orders

## 2012-02-11 ENCOUNTER — Encounter: Payer: Self-pay | Admitting: Nurse Practitioner

## 2012-04-03 ENCOUNTER — Other Ambulatory Visit: Payer: Self-pay | Admitting: Cardiology

## 2012-07-21 ENCOUNTER — Other Ambulatory Visit: Payer: Self-pay | Admitting: Nurse Practitioner

## 2012-10-04 ENCOUNTER — Other Ambulatory Visit: Payer: Self-pay

## 2012-10-04 MED ORDER — CLOPIDOGREL BISULFATE 75 MG PO TABS: 75.0000 mg | ORAL_TABLET | Freq: Every day | ORAL | Status: DC

## 2012-11-18 DIAGNOSIS — R739 Hyperglycemia, unspecified: Secondary | ICD-10-CM | POA: Insufficient documentation

## 2012-11-21 ENCOUNTER — Other Ambulatory Visit: Payer: Self-pay | Admitting: Neurology

## 2013-01-17 ENCOUNTER — Other Ambulatory Visit: Payer: Self-pay | Admitting: Neurology

## 2013-02-07 ENCOUNTER — Other Ambulatory Visit: Payer: Self-pay | Admitting: Neurology

## 2013-03-07 ENCOUNTER — Other Ambulatory Visit: Payer: Self-pay | Admitting: Neurology

## 2013-03-08 ENCOUNTER — Telehealth: Payer: Self-pay | Admitting: Nurse Practitioner

## 2013-03-08 MED ORDER — GABAPENTIN 600 MG PO TABS: 600.0000 mg | ORAL_TABLET | Freq: Four times a day (QID) | ORAL | Status: DC

## 2013-03-08 NOTE — Telephone Encounter (Signed)
Needs RX for Gabapentin filled has made an appt

## 2013-03-08 NOTE — Telephone Encounter (Signed)
Rx sent 

## 2013-03-10 ENCOUNTER — Ambulatory Visit (INDEPENDENT_AMBULATORY_CARE_PROVIDER_SITE_OTHER): Payer: BC Managed Care – PPO | Admitting: Nurse Practitioner

## 2013-03-10 ENCOUNTER — Encounter: Payer: Self-pay | Admitting: Nurse Practitioner

## 2013-03-10 VITALS — BP 136/79 | HR 50 | Ht 62.25 in | Wt 198.0 lb

## 2013-03-10 DIAGNOSIS — R269 Unspecified abnormalities of gait and mobility: Secondary | ICD-10-CM | POA: Insufficient documentation

## 2013-03-10 DIAGNOSIS — R209 Unspecified disturbances of skin sensation: Secondary | ICD-10-CM | POA: Insufficient documentation

## 2013-03-10 DIAGNOSIS — M4712 Other spondylosis with myelopathy, cervical region: Secondary | ICD-10-CM

## 2013-03-10 MED ORDER — GABAPENTIN 600 MG PO TABS: 600.0000 mg | ORAL_TABLET | Freq: Four times a day (QID) | ORAL | Status: DC

## 2013-03-10 MED ORDER — TIZANIDINE HCL 4 MG PO TABS: 4.0000 mg | ORAL_TABLET | Freq: Every day | ORAL | Status: DC

## 2013-03-10 MED ORDER — DULOXETINE HCL 60 MG PO CPEP: 60.0000 mg | ORAL_CAPSULE | Freq: Every day | ORAL | Status: DC

## 2013-03-10 NOTE — Patient Instructions (Signed)
Continue gabapentin at current dose will refill  Will try tizanidine at night for muscle cramps Continue the Cymbalta at current dose will refill Followup yearly and when necessary

## 2013-03-10 NOTE — Progress Notes (Signed)
[facility]  PATIENT: Maria Arroyo DOB: 04-Apr-1950   REASON FOR VISIT: follow up for cervical spondylosis    HISTORY OF PRESENT ILLNESS: Maria Arroyo, 62 year old female returns for followup. She was last seen 01/20/2012. She continues to have complaints of hypersensitivity of the skin arms greater than legs and some muscle cramping involving the arms and legs. She is currently on Cymbalta 60 daily along with gabapentin 600 (4) times daily. She is tearful today because her brother died recently unexpectedly. She is sleeping better with temazepam which was ordered by her primary care. Her neurologic symptoms have not changed.   HISTORY: of a cervical myelopathy, status post decompressive surgery in November 2012. MRI evaluation of the cervical spine done prior to surgery shows evidence of cord edema at the C4-5 level. MRI of the brain was also done, and this was unremarkable. The patient has continued to have complaints of hypersensitivity of the skin of the arms greater than the legs. The patient has crawling sensations throughout the body, including the left lower back. The patient has some fecal incontinence, but she indicates that the bladder is working fairly well. The patient has some mild gait instability. The patient reports a pressure sensation in the eyes, and crawling sensations along the head. The patient denies any increased symptoms with neck flexion or extension. The patient reports that she has muscle cramps involving the arms and legs. The patient is on Vytorin, but she says that she has been on this medication for several years and never had problems with muscle cramps. Niacin was recently added and complains of flushing. The patient indicates that she does not sleep well at times.    REVIEW OF SYSTEMS: Full 14 system review of systems performed and notable only for those listed, all others are neg:  Constitutional:fatigue Cardiovascular: N/A    Ear/Nose/Throat: N/A  Skin: N/A  Eyes: N/A  Respiratory: N/A  Gastroitestinal: N/A  Hematology/Lymphatic: N/A  Endocrine: N/A Musculoskeletal:N/A  Allergy/Immunology: N/A  Neurological:weakness Psychiatric: depression  ALLERGIES: Allergies  Allergen Reactions  . Ace Inhibitors Other (See Comments)    Worsening renal function   . Codeine-Guaifenesin [Guaifenesin-Codeine]     Some nausea--but ok with percocet    HOME MEDICATIONS: Outpatient Prescriptions Prior to Visit  Medication Sig Dispense Refill  . amLODipine (NORVASC) 10 MG tablet Take 10 mg by mouth daily.       [name] aspirin 325 MG tablet Take 325 mg by mouth daily.       . Calcium Carbonate-Vitamin D (CALCIUM 600 + D PO) Take 1 tablet by mouth daily.       . carisoprodol (SOMA) 350 MG tablet Take 350 mg by mouth 4 (four) times daily as needed. For muscle spasms      . Cholecalciferol (VITAMIN D) 2000 UNITS tablet Take 4,000 Units by mouth daily.       . clopidogrel (PLAVIX) 75 MG tablet Take 1 tablet (75 mg total) by mouth daily.  30 tablet  5  . CYMBALTA 60 MG capsule Take 60 mg by mouth Daily.       [name] ezetimibe-simvastatin (VYTORIN) 10-40 MG per tablet Take 1 tablet by mouth at bedtime.        . gabapentin (NEURONTIN) 600 MG tablet Take 1 tablet (600 mg total) by mouth 4 (four) times daily. At 8am 1pm 6pm and 10pm  120 tablet  0  . labetalol (NORMODYNE) 100 MG tablet Take 100 mg by mouth as directed.       [name]  Multiple Vitamin (MULTIVITAMIN) tablet Take 1 tablet by mouth daily.       [name] NIASPAN 500 MG CR tablet Take 500 mg by mouth as directed.       . nitroGLYCERIN (NITROSTAT) 0.4 MG SL tablet Place 1 tablet (0.4 mg total) under the tongue every 5 (five) minutes as needed. For chest pain  100 tablet  3  . oxyCODONE-acetaminophen (PERCOCET) 10-325 MG per tablet Take 1 tablet by mouth every 4 (four) hours as needed. For pain      . temazepam (RESTORIL) 30 MG capsule Take 30 mg by mouth at bedtime as needed. For sleep      .  torsemide (DEMADEX) 20 MG tablet Take 20 mg by mouth daily.         No facility-administered medications prior to visit.    PAST MEDICAL HISTORY: Past Medical History  Diagnosis Date  . Coronary artery disease   . COPD (chronic obstructive pulmonary disease)   . Hypertension   . Hyperlipidemia   . Tobacco abuse   . OA (osteoarthritis)   . Depression   . Neuropathy   . Cervical disc disease     PAST SURGICAL HISTORY: Past Surgical History  Procedure Laterality Date  . Cardiac catheterization  04/09/2009    EF 60%  . Cardiac catheterization  03/24/2001    EF 55%  . Cardiac catheterization  01/06/2001    EF 65%  . Bladder surgery    . Coronary stent placement  03/2009    STENTING OF THE PROXIMAL TO MID RIGHT CORONARY  . Abdominal hysterectomy    . Cholecystectomy    . Anterior cervical decomp/discectomy fusion  02/06/2011    Procedure: ANTERIOR CERVICAL DECOMPRESSION/DISCECTOMY FUSION 2 LEVELS;  Surgeon: Carmela Hurt;  Location: [facility];  Service: Neurosurgery;  Laterality: N/A;  Anterior Cervical Four-Five/Five-Six Decompression and Fusion with Plating and Bonegraft    FAMILY HISTORY: Family History  Problem Relation Age of Onset  . Aneurysm Mother   . Heart attack Father   . Heart failure Father   . Stroke Sister   . Heart attack Brother     SOCIAL HISTORY: History   Social History  . Marital Status: Married    Spouse Name: N/A    Number of Children: 2  . Years of Education: 10   Occupational History  . housewife    Social History Main Topics  . Smoking status: Current Every Day Smoker -- 0.50 packs/day  . Smokeless tobacco: Never Used     Comment: has cut back on smoking / down to 1/2 pk daily  . Alcohol Use: No  . Drug Use: No  . Sexual Activity: Not on file   Other Topics Concern  . 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   The patient has a 10th grade education.   The patient is  left-handed.   The patient has two children.   Patient drinks three cups of soda daily.         PHYSICAL EXAM  Filed Vitals:   03/10/13 1455  BP: 136/79  Pulse: 50  Height: 5' 2.25" (1.581 m)  Weight: 198 lb (89.812 kg)   Body mass index is 35.93 kg/(m^2).  Generalized: Well developed, moderately obese female in no acute distress  Neck: Supple,with full ROM  Skin no peripheral edema  Neurological examination   Mentation: Alert oriented to time, place, history taking. Follows  all commands speech and language fluent  Cranial nerve II-XII: Pupils were equal round reactive to light extraocular movements were full, visual field were full on confrontational test. Facial sensation and strength were normal. hearing was intact to finger rubbing bilaterally. Uvula tongue midline. head turning and shoulder shrug were normal and symmetric.Tongue protrusion into cheek strength was normal. Motor: normal bulk and tone, full strength in the BUE, BLE, fine finger movements normal, no pronator drift. No focal weakness Sensory: normal and symmetric to light touch, pinprick, and  vibration in upper and lower extremities Coordination: finger-nose-finger, heel-to-shin bilaterally, no dysmetria Reflexes: Brachioradialis 2/2, biceps 2/2, triceps 2/2, patellar 2/2, Achilles 2/2, plantar responses were flexor bilaterally. Gait and Station: Rising up from seated position without assistance, normal stance,  moderate stride, good arm swing, smooth turning, able to perform tiptoe, and heel walking without difficulty. Tandem gait is unsteady  DIAGNOSTIC DATA (LABS, IMAGING, TESTING) - None to review   ASSESSMENT AND PLAN  62 y.o. year old female  has a past medical history of Coronary artery disease; COPD (chronic obstructive pulmonary disease); Hypertension; Hyperlipidemia; Tobacco abuse; OA (osteoarthritis); Depression; Neuropathy; and Cervical disc disease. here in followup. Symptoms are fairly well  controlled  Continue gabapentin at current dose will refill  Will try tizanidine at night for muscle cramps Continue the Cymbalta at current dose will refill Followup yearly and when necessar Nilda Riggs, [facility], [facility], APRN  [facility] [address], [address] [place], [place] [address] [phone]

## 2013-03-10 NOTE — Progress Notes (Signed)
I have read the note, and I agree with the clinical assessment and plan.  Maria Arroyo,Maria Arroyo   

## 2013-05-02 ENCOUNTER — Other Ambulatory Visit: Payer: Self-pay | Admitting: Cardiology

## 2013-05-04 ENCOUNTER — Other Ambulatory Visit: Payer: Self-pay | Admitting: Cardiology

## 2013-07-03 ENCOUNTER — Other Ambulatory Visit: Payer: Self-pay | Admitting: Cardiology

## 2013-09-06 ENCOUNTER — Other Ambulatory Visit: Payer: Self-pay | Admitting: Cardiology

## 2013-09-20 ENCOUNTER — Encounter: Payer: Self-pay | Admitting: Cardiology

## 2013-10-02 ENCOUNTER — Ambulatory Visit (INDEPENDENT_AMBULATORY_CARE_PROVIDER_SITE_OTHER): Payer: 59 | Admitting: Cardiology

## 2013-10-02 ENCOUNTER — Encounter: Payer: Self-pay | Admitting: Cardiology

## 2013-10-02 VITALS — BP 136/76 | HR 49 | Ht 61.0 in | Wt 195.0 lb

## 2013-10-02 DIAGNOSIS — I2510 Atherosclerotic heart disease of native coronary artery without angina pectoris: Secondary | ICD-10-CM

## 2013-10-02 MED ORDER — CLOPIDOGREL BISULFATE 75 MG PO TABS: ORAL_TABLET | ORAL | Status: DC

## 2013-10-02 NOTE — Progress Notes (Signed)
Patient ID: Maria Arroyo, female   DOB: 1950/10/31, 63 y.o.   MRN: [id number]    10/02/2013 LAYLONI FAHRNER   [birth year]  [id number]  Primary Physicia [name], MD Primary Cardiologist: Dr. Martinique  HPI:  The patient is a 63 year old female, followed by Dr. Martinique, who presents to clinic today for followup and is also requesting refills of her Plavix. She has not been seen since September 2013. She has a history of coronary artery disease and status post stenting of the mid right coronary artery in December of 1999 with a 3.0 x 25 mm NIR Primo stent. The proximal right coronary was stented in October of 2002 with a 3.0 x 23 mm Zeta stent. In January of 2011 she had a new high-grade stenosis in the mid right coronary between the prior stents and this was stented using a 3.0 x 18 mm Promus stent. In September 2013, she complained of exertional dyspnea. Subsequently, Dr. Martinique recommended that she undergo repeat cardiac catheterization. This was performed by Dr. Martinique on 12/21/2011. She was noted to have nonobstructive coronary disease. There was modest disease throughout the mid RCA that was unchanged compared to her prior cardiac catheterization 2011. The remainder of the stents were widely patent. She was noted to have normal left ventricular function with an ejection fraction of 55-60%. Dr. Martinique recommended that she be continued on medical therapy. Since that time she has been maintained on dual antiplatelet therapy with aspirin plus Plavix, amlodipine and labetalol. Her history is also significant for hypertension and ongoing tobacco use. She states that she has been smoking for over 30+ years and continues to smoke, on average, 1.5 packs per day.  Today in clinic, she states that she has been doing fairly well. She denies any anginal symptoms. Occasionally, she notes dyspnea on exertion but she states that this only occurs when she has flareups of her fibromyalgia. She states that when  her fibromyalgia is under control, she never has exertional dyspnea. She states that she is very active around her house performing household chores and has no difficulties ambulating under normal conditions. She denies dizziness, lightheadedness, syncope/near-syncope, palpitations, orthopnea, PND or lower extremity edema. She states that she has been fully compliant with her medications. States that her PCP, Dr. Melford Aase, follows her cholesterol.   Current Outpatient Prescriptions  Medication Sig Dispense Refill  . amLODipine (NORVASC) 10 MG tablet Take 10 mg by mouth daily.       [name] aspirin 81 MG tablet Take 81 mg by mouth daily.      . Calcium Carbonate-Vitamin D (CALCIUM 600 + D PO) Take 1 tablet by mouth daily.       . carisoprodol (SOMA) 350 MG tablet Take 350 mg by mouth 4 (four) times daily as needed. For muscle spasms      . Cholecalciferol (VITAMIN D) 2000 UNITS tablet Take 4,000 Units by mouth daily.       . clopidogrel (PLAVIX) 75 MG tablet TAKE 1 TABLET BY MOUTH EVERY DAY  14 tablet  0  . gabapentin (NEURONTIN) 600 MG tablet Take 1 tablet (600 mg total) by mouth 4 (four) times daily. At 8am 1pm 6pm and 10pm  120 tablet  11  . labetalol (NORMODYNE) 100 MG tablet Take 100 mg by mouth as directed.       . Multiple Vitamin (MULTIVITAMIN) tablet Take 1 tablet by mouth daily.       . nitroGLYCERIN (NITROSTAT) 0.4 MG SL tablet Place 1 tablet (  0.4 mg total) under the tongue every 5 (five) minutes as needed. For chest pain  100 tablet  3  . temazepam (RESTORIL) 30 MG capsule Take 30 mg by mouth at bedtime as needed. For sleep      . tiZANidine (ZANAFLEX) 4 MG tablet Take 1 tablet (4 mg total) by mouth at bedtime.  30 tablet  6  . torsemide (DEMADEX) 20 MG tablet Take 20 mg by mouth daily.         No current facility-administered medications for this visit.    Allergies  Allergen Reactions  . Ace Inhibitors Other (See Comments)    Worsening renal function   . Codeine-Guaifenesin  [Guaifenesin-Codeine]     Some nausea--but ok with percocet    History   Social History  . Marital Status: Married    Spouse Name: N/A    Number of Children: 2  . Years of Education: 10   Occupational History  . housewife    Social History Main Topics  . Smoking status: Current Every Day Smoker -- 1.50 packs/day for 35 years    Types: Cigarettes  . Smokeless tobacco: Never Used     Comment: 1-1.5 ppd (10/02/13)  . Alcohol Use: No  . Drug Use: No  . Sexual Activity: Not on file   Other Topics Concern  . 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   The patient has a 10th grade education.   The patient is left-handed.   The patient has two children.   Patient drinks three cups of soda daily.         Review of Systems: General: negative for chills, fever, night sweats or weight changes.  Cardiovascular: negative for chest pain, dyspnea on exertion, edema, orthopnea, palpitations, paroxysmal nocturnal dyspnea or shortness of breath Dermatological: negative for rash Respiratory: negative for cough or wheezing Urologic: negative for hematuria Abdominal: negative for nausea, vomiting, diarrhea, bright red blood per rectum, melena, or hematemesis Neurologic: negative for visual changes, syncope, or dizziness All other systems reviewed and are otherwise negative except as noted above.    Blood pressure 136/76, pulse 49, height 5\' 1"  (1.549 m), weight 195 lb (88.451 kg).  General appearance: alert, cooperative and no distress Neck: no carotid bruit and no JVD Lungs: clear to auscultation bilaterally Heart: regular rate and rhythm, S1, S2 normal, no murmur, click, rub or gallop Extremities: no LEE Pulses: 2+ and symmetric Skin: warm and dry Neurologic: Grossly normal  EKG Sinus Bradycardia, HR 49 bpm; nonischemic  ASSESSMENT AND PLAN:   1. CAD: Prior history of stenting of her RCA. Her last left heart  catheterization in 2013 demonstrated nonobstructive CAD, unchanged compared to prior cardiac catheterization in 2011. Her stents were widely patent. Normal LV function. She denies any recurrent anginal symptoms since that time. Continue medical therapy with aspirin, Plavix, beta blocker and amlodipine and simvastatin.  2. Hypertension: Well controlled. Continue labetalol and amlodipine.  3. Hyperlipidemia: This is followed by her PCP. Continue simvastatin.  4. Tobacco abuse: Strongly encourage complete smoking cessation. We had a long discussion regarding the association between tobacco abuse and overall cardiovascular health. She wants to make attempts to cut back. She states that she plans on following up with her PCP to discuss options to help aid with smoking cessation. I recommended that she continue his conversation with her PCP.  5. Sinus bradycardia: Heart rate is 49 beats per minute. She  is completely asymptomatic, denying dizziness, lightheadedness and fatigue. She was instructed to notify our office .   PLAN   Mrs. Cloninger appears to be stable from a cardiac standpoint. She denies any recurrent chest pain. Her shortness of breath only seems to be related to flareups of her fibromyalgia. Under normal conditions, she denies any exertional dyspnea with physical activities and she also denies any resting dyspnea. I recommend that she continue her current medical therapy. She was provided a refill for Plavix. Her blood pressure is well-controlled. Her PCP will continue to follow/manage her cholesterol. We discussed the importance of smoking cessation. We also discussed the importance of compliance with regular routine follow up appointments. I recommended that she followup with Dr. Martinique in 6 months for reassessment.  Nickolis Diel, [place] 10/02/2013 11:02 AM

## 2013-10-02 NOTE — Patient Instructions (Signed)
Your physician wants you to follow-up in: 6 months with Dr. Martinique. You will receive a reminder letter in the mail two months in advance. If you don't receive a letter, please call our office to schedule the follow-up appointment.

## 2013-10-06 ENCOUNTER — Other Ambulatory Visit: Payer: Self-pay | Admitting: Neurology

## 2013-10-06 ENCOUNTER — Other Ambulatory Visit: Payer: Self-pay | Admitting: Nurse Practitioner

## 2013-12-06 ENCOUNTER — Other Ambulatory Visit: Payer: Self-pay | Admitting: Cardiology

## 2014-02-14 ENCOUNTER — Encounter: Payer: Self-pay | Admitting: Neurology

## 2014-02-20 ENCOUNTER — Encounter: Payer: Self-pay | Admitting: Neurology

## 2014-03-08 ENCOUNTER — Encounter (HOSPITAL_COMMUNITY): Payer: Self-pay | Admitting: Cardiology

## 2014-03-12 ENCOUNTER — Ambulatory Visit (INDEPENDENT_AMBULATORY_CARE_PROVIDER_SITE_OTHER): Payer: 59 | Admitting: Nurse Practitioner

## 2014-03-12 ENCOUNTER — Encounter: Payer: Self-pay | Admitting: Nurse Practitioner

## 2014-03-12 VITALS — BP 138/72 | HR 56 | Ht 61.0 in | Wt 195.0 lb

## 2014-03-12 DIAGNOSIS — R269 Unspecified abnormalities of gait and mobility: Secondary | ICD-10-CM

## 2014-03-12 DIAGNOSIS — M4712 Other spondylosis with myelopathy, cervical region: Secondary | ICD-10-CM

## 2014-03-12 DIAGNOSIS — R209 Unspecified disturbances of skin sensation: Secondary | ICD-10-CM

## 2014-03-12 MED ORDER — GABAPENTIN 600 MG PO TABS: 600.0000 mg | ORAL_TABLET | Freq: Four times a day (QID) | ORAL | Status: DC

## 2014-03-12 NOTE — Progress Notes (Signed)
I have read the note, and I agree with the clinical assessment and plan.  Gracee Ratterree KEITH   

## 2014-03-12 NOTE — Progress Notes (Signed)
[facility]  PATIENT: Maria RORRER DOB: 03-03-1951   REASON FOR VISIT: Follow-up for neuropathy, gait disorder    HISTORY OF PRESENT ILLNESS:Maria Arroyo, 63 year old female returns for followup. She was last seen 03/10/13. She continues to have complaints of hypersensitivity of the skin arms greater than legs and some muscle cramping involving the arms and legs. She was on  Cymbalta 60 daily at her last visit 03/10/2013 however this is been stopped by her primary care Dr. Melford Aase. She remains on gabapentin 600 (4) times daily. She recently received the diagnosis of fibromyalgia. She has also stopped the Flexeril she did not feel it was helping her cramping however she is on Soma 4 times a day.She is sleeping better with temazepam which was ordered by her primary care. Her neurologic symptoms have not changed. She gets no regular exercise   HISTORY: of a cervical myelopathy, status post decompressive surgery in November 2012. MRI evaluation of the cervical spine done prior to surgery shows evidence of cord edema at the C4-5 level. MRI of the brain was also done, and this was unremarkable. The patient has continued to have complaints of hypersensitivity of the skin of the arms greater than the legs. The patient has crawling sensations throughout the body, including the left lower back. The patient has some fecal incontinence, but she indicates that the bladder is working fairly well. The patient has some mild gait instability. The patient reports a pressure sensation in the eyes, and crawling sensations along the head. The patient denies any increased symptoms with neck flexion or extension. The patient reports that she has muscle cramps involving the arms and legs. The patient is on Vytorin, but she says that she has been on this medication for several years and never had problems with muscle cramps. Niacin was recently added and complains of flushing. The patient indicates  that she does not sleep well at times.    REVIEW OF SYSTEMS: Full 14 system review of systems performed and notable only for those listed, all others are neg:  Constitutional: Fatigue Cardiovascular: N/A  Ear/Nose/Throat: N/A  Skin: N/A  Eyes: Light sensitivity  Respiratory: N/A  Gastroitestinal: N/A  Hematology/Lymphatic: N/A  Endocrine: N/A Musculoskeletal: Joint pain, back pain, neck pain, muscle cramps  Allergy/Immunology: N/A  Neurological: Numbness, weakness Psychiatric: Depression Sleep : NA   ALLERGIES: Allergies  Allergen Reactions  . Ace Inhibitors Other (See Comments)    Worsening renal function   . Codeine   . Codeine-Guaifenesin [Guaifenesin-Codeine]     Some nausea--but ok with percocet    HOME MEDICATIONS: Outpatient Prescriptions Prior to Visit  Medication Sig Dispense Refill  . amLODipine (NORVASC) 10 MG tablet Take 10 mg by mouth daily.     [name] aspirin 81 MG tablet Take 81 mg by mouth daily.    . Calcium Carbonate-Vitamin D (CALCIUM 600 + D PO) Take 1 tablet by mouth daily.     . carisoprodol (SOMA) 350 MG tablet Take 350 mg by mouth 4 (four) times daily as needed. For muscle spasms    . Cholecalciferol (VITAMIN D) 2000 UNITS tablet Take 4,000 Units by mouth daily.     . clopidogrel (PLAVIX) 75 MG tablet TAKE 1 TABLET BY MOUTH EVERY DAY 30 tablet 11  . gabapentin (NEURONTIN) 600 MG tablet Take 1 tablet (600 mg total) by mouth 4 (four) times daily. At 8am 1pm 6pm and 10pm 120 tablet 11  . labetalol (NORMODYNE) 100 MG tablet Take 100 mg by mouth  as directed.     . Multiple Vitamin (MULTIVITAMIN) tablet Take 1 tablet by mouth daily.     . nitroGLYCERIN (NITROSTAT) 0.4 MG SL tablet Place 1 tablet (0.4 mg total) under the tongue every 5 (five) minutes as needed. For chest pain 100 tablet 3  . temazepam (RESTORIL) 30 MG capsule Take 30 mg by mouth at bedtime as needed. For sleep    . tiZANidine (ZANAFLEX) 4 MG tablet TAKE 1 TABLET BY MOUTH AT BEDTIME 90 tablet  1  . torsemide (DEMADEX) 20 MG tablet Take 20 mg by mouth daily.       No facility-administered medications prior to visit.    PAST MEDICAL HISTORY: Past Medical History  Diagnosis Date  . Coronary artery disease   . COPD (chronic obstructive pulmonary disease)   . Hypertension   . Hyperlipidemia   . Tobacco abuse   . OA (osteoarthritis)   . Depression   . Neuropathy   . Cervical disc disease     PAST SURGICAL HISTORY: Past Surgical History  Procedure Laterality Date  . Cardiac catheterization  04/09/2009    EF 60%  . Cardiac catheterization  03/24/2001    EF 55%  . Cardiac catheterization  01/06/2001    EF 65%  . Bladder surgery    . Coronary stent placement  03/2009    STENTING OF THE PROXIMAL TO MID RIGHT CORONARY  . Abdominal hysterectomy    . Cholecystectomy    . Anterior cervical decomp/discectomy fusion  02/06/2011    Procedure: ANTERIOR CERVICAL DECOMPRESSION/DISCECTOMY FUSION 2 LEVELS;  Surgeon: Winfield Cunas;  Location: [facility];  Service: Neurosurgery;  Laterality: N/A;  Anterior Cervical Four-Five/Five-Six Decompression and Fusion with Plating and Bonegraft  . Left heart catheterization with coronary angiogram N/A 12/21/2011    Procedure: LEFT HEART CATHETERIZATION WITH CORONARY ANGIOGRAM;  Surgeon: Peter M Martinique, MD;  Location: [facility] CATH LAB;  Service: Cardiovascular;  Laterality: N/A;    FAMILY HISTORY: Family History  Problem Relation Age of Onset  . Aneurysm Mother   . Heart attack Father   . Heart failure Father   . Stroke Sister   . Heart attack Brother     SOCIAL HISTORY: History   Social History  . Marital Status: Married    Spouse Name: N/A    Number of Children: 2  . Years of Education: 10   Occupational History  . housewife    Social History Main Topics  . Smoking status: Current Every Day Smoker -- 1.50 packs/day for 35 years    Types: Cigarettes  . Smokeless tobacco: Never Used     Comment: 1-1.5 ppd (10/02/13)  . Alcohol Use:  No  . Drug Use: No  . Sexual Activity: Not on file   Other Topics Concern  . 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   The patient has a 10th grade education.   The patient is left-handed.   The patient has two children.   Patient drinks three cups of soda daily.         PHYSICAL EXAM  Filed Vitals:   03/12/14 1456  BP: 138/72  Pulse: 56  Height: 5\' 1"  (1.549 m)  Weight: 195 lb (88.451 kg)   Body mass index is 36.86 kg/(m^2). Generalized: Well developed, moderately obese female in no acute distress  Neck: Supple,with full ROM  Skin no peripheral edema  Neurological examination   Mentation:  Alert oriented to time, place, history taking. Follows all commands speech and language fluent  Cranial nerve II-XII: Pupils were equal round reactive to light extraocular movements were full, visual field were full on confrontational test. Facial sensation and strength were normal. hearing was intact to finger rubbing bilaterally. Uvula tongue midline. head turning and shoulder shrug were normal and symmetric.Tongue protrusion into cheek strength was normal. Motor: normal bulk and tone, full strength in the BUE, BLE, fine finger movements normal, no pronator drift. No focal weakness Sensory: normal and symmetric to light touch, pinprick, and vibration in upper and lower extremities Coordination: finger-nose-finger, heel-to-shin bilaterally, no dysmetria Reflexes: Brachioradialis 2/2, biceps 2/2, triceps 2/2, patellar 2/2, Achilles 2/2, plantar responses were flexor bilaterally. Gait and Station: Rising up from seated position without assistance, normal stance, moderate stride, good arm swing, smooth turning, able to perform tiptoe, and heel walking without difficulty. Tandem gait is unsteady. No assistive device   DIAGNOSTIC DATA (LABS, IMAGING, TESTING)  ASSESSMENT AND PLAN  63 y.o. year old female  has a past medical  history of Coronary artery disease; COPD (chronic obstructive pulmonary disease); Hypertension; Hyperlipidemia; Tobacco abuse; OA (osteoarthritis); Depression; Neuropathy; and Cervical disc disease. here to follow-up  Continue gabapentin at current dose will refill Given information on neuropathy Needs to perform chair exercises or  walk daily for overall general health Follow-up yearly and when necessary Dennie Bible, [facility], [facility], APRN  [facility] [address] [place], [place] [address] [phone]

## 2014-03-12 NOTE — Patient Instructions (Signed)
Continue gabapentin at current dose will refill Given information on neuropathy Needs to perform chair exercises or  walk daily for overall general health Follow-up yearly and when necessary

## 2014-04-04 ENCOUNTER — Other Ambulatory Visit: Payer: Self-pay | Admitting: Nurse Practitioner

## 2014-04-26 ENCOUNTER — Encounter: Payer: Self-pay | Admitting: Cardiology

## 2014-05-01 ENCOUNTER — Other Ambulatory Visit: Payer: Self-pay | Admitting: Neurology

## 2014-09-15 ENCOUNTER — Other Ambulatory Visit: Payer: Self-pay | Admitting: Cardiology

## 2014-09-17 ENCOUNTER — Other Ambulatory Visit: Payer: Self-pay | Admitting: Cardiology

## 2014-09-17 ENCOUNTER — Other Ambulatory Visit: Payer: Self-pay

## 2014-09-17 MED ORDER — CLOPIDOGREL BISULFATE 75 MG PO TABS: ORAL_TABLET | ORAL | Status: DC

## 2014-09-17 NOTE — Telephone Encounter (Signed)
Denied for 90 day supply ,patient needs appointment.

## 2014-10-15 ENCOUNTER — Other Ambulatory Visit: Payer: Self-pay | Admitting: Cardiology

## 2014-10-27 ENCOUNTER — Other Ambulatory Visit: Payer: Self-pay | Admitting: Neurology

## 2014-12-07 ENCOUNTER — Other Ambulatory Visit: Payer: Self-pay | Admitting: Cardiology

## 2014-12-12 ENCOUNTER — Other Ambulatory Visit: Payer: Self-pay | Admitting: Cardiology

## 2014-12-12 MED ORDER — CLOPIDOGREL BISULFATE 75 MG PO TABS: 75.0000 mg | ORAL_TABLET | Freq: Every day | ORAL | Status: DC

## 2014-12-12 NOTE — Telephone Encounter (Signed)
°  1. Which medications need to be refilled? Plavix  2. Which pharmacy is medication to be sent to?[facility] on [address] and [address]   3. Do they need a 30 day or 90 day supply? 90  4. Would they like a call back once the medication has been sent to the pharmacy? Yes   Appt is on 04/04/14 at 3:30pm

## 2014-12-12 NOTE — Telephone Encounter (Signed)
Returned call to patient no answer.Left message on personal voice mail plavix refill sent to pharmacy.

## 2014-12-29 HISTORY — PX: OTHER SURGICAL HISTORY: SHX169

## 2015-03-13 ENCOUNTER — Encounter: Payer: Self-pay | Admitting: Nurse Practitioner

## 2015-03-13 ENCOUNTER — Ambulatory Visit (INDEPENDENT_AMBULATORY_CARE_PROVIDER_SITE_OTHER): Payer: 59 | Admitting: Nurse Practitioner

## 2015-03-13 VITALS — BP 119/75 | HR 63 | Ht 61.0 in | Wt 183.2 lb

## 2015-03-13 DIAGNOSIS — R209 Unspecified disturbances of skin sensation: Secondary | ICD-10-CM

## 2015-03-13 DIAGNOSIS — M4712 Other spondylosis with myelopathy, cervical region: Secondary | ICD-10-CM | POA: Diagnosis not present

## 2015-03-13 DIAGNOSIS — R269 Unspecified abnormalities of gait and mobility: Secondary | ICD-10-CM

## 2015-03-13 MED ORDER — GABAPENTIN 600 MG PO TABS: 600.0000 mg | ORAL_TABLET | Freq: Four times a day (QID) | ORAL | Status: DC

## 2015-03-13 NOTE — Progress Notes (Signed)
I have read the note, and I agree with the clinical assessment and plan.  Maria Arroyo   

## 2015-03-13 NOTE — Patient Instructions (Signed)
Continue gabapentin at current dose will refill Needs to perform chair exercises or walk daily for overall general health Stay well-hydrated by drinking water Follow-up yearly and when necessary

## 2015-03-13 NOTE — Progress Notes (Signed)
[facility]  PATIENT: Maria Arroyo DOB: Sep 28, 1950   REASON FOR VISIT: Follow-up for disturbance in skin, abnormality of gait, peripheral neuropathy HISTORY FROM: Patient    HISTORY OF PRESENT ILLNESS:Maria Arroyo, 64 year old female returns for followup. She was last seen 03/12/14. She continues to have complaints of hypersensitivity of the skin arms greater than legs and some muscle cramping involving the arms and legs.  She remains on gabapentin 600 (4) times daily. She recently received the diagnosis of fibromyalgia. She has also stopped the Flexeril she did not feel it was helping her cramping however she is on Soma 4 times a day.She is sleeping better with temazepam which was ordered by her primary care. Her neurologic symptoms have not changed. She gets no regular exercise, she has not had any falls.New diagnosis of gout since last seen   HISTORY: of a cervical myelopathy, status post decompressive surgery in November 2012. MRI evaluation of the cervical spine done prior to surgery shows evidence of cord edema at the C4-5 level. MRI of the brain was also done, and this was unremarkable. The patient has continued to have complaints of hypersensitivity of the skin of the arms greater than the legs. The patient has crawling sensations throughout the body, including the left lower back. The patient has some fecal incontinence, but she indicates that the bladder is working fairly well. The patient has some mild gait instability. The patient reports a pressure sensation in the eyes, and crawling sensations along the head. The patient denies any increased symptoms with neck flexion or extension. The patient reports that she has muscle cramps involving the arms and legs. The patient is on Vytorin, but she says that she has been on this medication for several years and never had problems with muscle cramps. Niacin was recently added and complains of flushing. The patient  indicates that she does not sleep well at times.    REVIEW OF SYSTEMS: Full 14 system review of systems performed and notable only for those listed, all others are neg:  Constitutional: neg  Cardiovascular: neg Ear/Nose/Throat: neg  Skin: neg Eyes: neg Respiratory: neg Gastroitestinal: neg  Hematology/Lymphatic: neg  Endocrine: neg Musculoskeletal:joint pain, neck pain back pain walking difficulty Allergy/Immunology: neg Neurological: neg Psychiatric: neg Sleep : neg   ALLERGIES: Allergies  Allergen Reactions  . Ace Inhibitors Other (See Comments)    Worsening renal function   . Codeine   . Codeine-Guaifenesin [Guaifenesin-Codeine]     Some nausea--but ok with percocet    HOME MEDICATIONS: Outpatient Prescriptions Prior to Visit  Medication Sig Dispense Refill  . amLODipine (NORVASC) 10 MG tablet Take 10 mg by mouth daily.     [name] aspirin 81 MG tablet Take 81 mg by mouth daily.    . Calcium Carbonate-Vitamin D (CALCIUM 600 + D PO) Take 1 tablet by mouth daily.     . carisoprodol (SOMA) 350 MG tablet Take 350 mg by mouth 4 (four) times daily as needed. For muscle spasms    . Cholecalciferol (VITAMIN D) 2000 UNITS tablet Take 4,000 Units by mouth daily.     . clopidogrel (PLAVIX) 75 MG tablet Take 1 tablet (75 mg total) by mouth daily. 30 tablet 4  . gabapentin (NEURONTIN) 600 MG tablet Take 1 tablet (600 mg total) by mouth 4 (four) times daily. At 8am 1pm 6pm and 10pm 120 tablet 11  . gabapentin (NEURONTIN) 600 MG tablet TAKE 1 TABLET BY MOUTH EVERY DAY AT 8AM, 1PM, 6PM, AND 10  PM 120 tablet 11  . labetalol (NORMODYNE) 100 MG tablet Take 100 mg by mouth as directed.     . Multiple Vitamin (MULTIVITAMIN) tablet Take 1 tablet by mouth daily.     . nitroGLYCERIN (NITROSTAT) 0.4 MG SL tablet Place 1 tablet (0.4 mg total) under the tongue every 5 (five) minutes as needed. For chest pain 100 tablet 3  . temazepam (RESTORIL) 30 MG capsule Take 30 mg by mouth at bedtime as needed.  For sleep    . tiZANidine (ZANAFLEX) 4 MG tablet TAKE 1 TABLET BY MOUTH AT BEDTIME 90 tablet 1  . torsemide (DEMADEX) 20 MG tablet Take 20 mg by mouth daily.      [name] tiZANidine (ZANAFLEX) 4 MG tablet TAKE 1 TABLET BY MOUTH AT BEDTIME 90 tablet 0   No facility-administered medications prior to visit.    PAST MEDICAL HISTORY: Past Medical History  Diagnosis Date  . Coronary artery disease   . COPD (chronic obstructive pulmonary disease) ([place])   . Hypertension   . Hyperlipidemia   . Tobacco abuse   . OA (osteoarthritis)   . Depression   . Neuropathy ([place])   . Cervical disc disease     PAST SURGICAL HISTORY: Past Surgical History  Procedure Laterality Date  . Cardiac catheterization  04/09/2009    EF 60%  . Cardiac catheterization  03/24/2001    EF 55%  . Cardiac catheterization  01/06/2001    EF 65%  . Bladder surgery    . Coronary stent placement  03/2009    STENTING OF THE PROXIMAL TO MID RIGHT CORONARY  . Abdominal hysterectomy    . Cholecystectomy    . Anterior cervical decomp/discectomy fusion  02/06/2011    Procedure: ANTERIOR CERVICAL DECOMPRESSION/DISCECTOMY FUSION 2 LEVELS;  Surgeon: Winfield Cunas;  Location: [place] [facility];  Service: Neurosurgery;  Laterality: N/A;  Anterior Cervical Four-Five/Five-Six Decompression and Fusion with Plating and Bonegraft  . Left heart catheterization with coronary angiogram N/A 12/21/2011    Procedure: LEFT HEART CATHETERIZATION WITH CORONARY ANGIOGRAM;  Surgeon: Peter M Martinique, MD;  Location: [facility];  Service: Cardiovascular;  Laterality: N/A;    FAMILY HISTORY: Family History  Problem Relation Age of Onset  . Aneurysm Mother   . Heart attack Father   . Heart failure Father   . Stroke Sister   . Heart attack Brother     SOCIAL HISTORY: Social History   Social History  . Marital Status: Married    Spouse Name: N/A  . Number of Children: 2  . Years of Education: 10   Occupational History  . housewife    Social  History Main Topics  . Smoking status: Current Every Day Smoker -- 1.50 packs/day for 35 years    Types: Cigarettes  . Smokeless tobacco: Never Used     Comment: 1-1.5 ppd (10/02/13)  . Alcohol Use: No  . Drug Use: No  . Sexual Activity: Not on file   Other Topics Concern  . 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   The patient has a 10th grade education.   The patient is left-handed.   The patient has two children.   Patient drinks three cups of soda daily.         PHYSICAL EXAM  Filed Vitals:   03/13/15 1523  BP: 119/75  Pulse: 63  Height: '5\' 1"'$  (1.549 m)  Weight: 183 lb 3.2  oz (83.099 kg)   Body mass index is 34.63 kg/(m^2). Generalized: Well developed, moderately obese female in no acute distress  Neck: Supple,with full ROM  Skin no peripheral edema  Neurological examination   Mentation: Alert oriented to time, place, history taking. Follows all commands speech and language fluent  Cranial nerve II-XII: Pupils were equal round reactive to light extraocular movements were full, visual field were full on confrontational test. Facial sensation and strength were normal. hearing was intact to finger rubbing bilaterally. Uvula tongue midline. head turning and shoulder shrug were normal and symmetric.Tongue protrusion into cheek strength was normal. Motor: normal bulk and tone, full strength in the BUE, BLE, fine finger movements normal, no pronator drift. No focal weakness Sensory: normal and symmetric to light touch, decreased pinprick to midshin , vibration in upper and lower extremities normal Coordination: finger-nose-finger, heel-to-shin bilaterally, no dysmetria Reflexes: Brachioradialis 2/2, biceps 2/2, triceps 2/2, patellar 2/2, Achilles 2/2, plantar responses were flexor bilaterally. Gait and Station: Rising up from seated position without assistance, normal stance, moderate stride, good arm swing, smooth  turning, able to perform tiptoe, and heel walking without difficulty. Tandem gait is unsteady. No assistive device   DIAGNOSTIC DATA (LABS, IMAGING, TESTING) -  ASSESSMENT AND PLAN 63 y.o. year old female has a past medical history of Coronary artery disease; COPD (chronic obstructive pulmonary disease); Hypertension; Hyperlipidemia; Tobacco abuse; OA (osteoarthritis); Depression; Neuropathy; and Cervical disc disease. here to follow-up  PLAN: Continue gabapentin at current dose will refill Needs to perform chair exercises or walk daily for overall general health Stay well-hydrated by drinking water Follow-up yearly and when necessary Dennie Bible, [facility], [facility], [facility] [address], [place], [place] [address] [phone]

## 2015-03-21 ENCOUNTER — Encounter: Payer: Self-pay | Admitting: Cardiology

## 2015-04-04 ENCOUNTER — Other Ambulatory Visit: Payer: Self-pay | Admitting: Family Medicine

## 2015-04-04 DIAGNOSIS — Z1231 Encounter for screening mammogram for malignant neoplasm of breast: Secondary | ICD-10-CM

## 2015-04-05 ENCOUNTER — Ambulatory Visit: Payer: Self-pay | Admitting: Cardiology

## 2015-04-12 ENCOUNTER — Ambulatory Visit: Payer: Self-pay

## 2015-05-02 ENCOUNTER — Other Ambulatory Visit: Payer: Self-pay | Admitting: Neurology

## 2015-05-30 ENCOUNTER — Encounter: Payer: Self-pay | Admitting: Cardiology

## 2015-05-30 ENCOUNTER — Ambulatory Visit (INDEPENDENT_AMBULATORY_CARE_PROVIDER_SITE_OTHER): Payer: BLUE CROSS/BLUE SHIELD | Admitting: Cardiology

## 2015-05-30 VITALS — BP 164/94 | HR 63 | Ht 61.0 in | Wt 177.0 lb

## 2015-05-30 DIAGNOSIS — I2510 Atherosclerotic heart disease of native coronary artery without angina pectoris: Secondary | ICD-10-CM

## 2015-05-30 DIAGNOSIS — Z720 Tobacco use: Secondary | ICD-10-CM

## 2015-05-30 DIAGNOSIS — E785 Hyperlipidemia, unspecified: Secondary | ICD-10-CM | POA: Diagnosis not present

## 2015-05-30 DIAGNOSIS — J449 Chronic obstructive pulmonary disease, unspecified: Secondary | ICD-10-CM | POA: Diagnosis not present

## 2015-05-30 DIAGNOSIS — I10 Essential (primary) hypertension: Secondary | ICD-10-CM

## 2015-05-30 MED ORDER — NITROGLYCERIN 0.4 MG SL SUBL: 0.4000 mg | SUBLINGUAL_TABLET | SUBLINGUAL | Status: DC | PRN

## 2015-05-30 NOTE — Patient Instructions (Addendum)
Continue your current therapy  Try and quit smoking.  I will see you in one year

## 2015-05-30 NOTE — Progress Notes (Signed)
Patient ID: Maria Arroyo, female   DOB: May 20, 1950, 65 y.o.   MRN: [id number]    05/30/2015 [name]   02-Feb-1951  [id number]  Primary [place], MD Primary Cardiologist: Dr. Martinique  HPI:  The patient is seen for followup CAD.   She has a history of coronary artery disease and status post stenting of the mid right coronary artery in December of 1999 with a 3.0 x 25 mm NIR Primo stent. The proximal right coronary was stented in October of 2002 with a 3.0 x 23 mm Zeta stent. In January of 2011 she had a new high-grade stenosis in the mid right coronary between the prior stents and this was stented using a 3.0 x 18 mm Promus stent. In September 2013, she had repeat cardiac catheterization. This showed nonobstructive coronary disease. There was modest disease throughout the mid RCA that was unchanged compared to her prior cardiac catheterization 2011. The remainder of the stents were widely patent. She was noted to have normal left ventricular function with an ejection fraction of 55-60%.  Since that time she has been maintained on dual antiplatelet therapy with aspirin plus Plavix, amlodipine and labetalol. Her history is also significant for hypertension and ongoing tobacco use. She  continues to smoke, on average, 1.5 packs per day.  On followup today she is very upset. Her sister just passed away unexpectedly and she found her. Very tearful today. Denies any chest pain or increased SOB. Notes she was on lisinopril but this was stopped due to low BP.   Current Outpatient Prescriptions  Medication Sig Dispense Refill  . allopurinol (ZYLOPRIM) 100 MG tablet Take 100 mg by mouth daily.    [name] amLODipine (NORVASC) 10 MG tablet Take 10 mg by mouth daily.     [name] aspirin 81 MG tablet Take 81 mg by mouth daily.    . Calcium Carbonate-Vitamin D (CALCIUM 600 + D PO) Take 1 tablet by mouth daily.     . carisoprodol (SOMA) 350 MG tablet Take 350 mg by mouth 4 (four) times daily as  needed. For muscle spasms    . Cholecalciferol (VITAMIN D) 2000 UNITS tablet Take 4,000 Units by mouth daily.     . clopidogrel (PLAVIX) 75 MG tablet Take 1 tablet (75 mg total) by mouth daily. 30 tablet 4  . gabapentin (NEURONTIN) 600 MG tablet Take 1 tablet (600 mg total) by mouth 4 (four) times daily. At 8am 1pm 6pm and 10pm 120 tablet 11  . labetalol (NORMODYNE) 100 MG tablet Take 100 mg by mouth as directed.     . Multiple Vitamin (MULTIVITAMIN) tablet Take 1 tablet by mouth daily.     . nitroGLYCERIN (NITROSTAT) 0.4 MG SL tablet Place 1 tablet (0.4 mg total) under the tongue every 5 (five) minutes as needed. For chest pain 100 tablet 3  . rosuvastatin (CRESTOR) 20 MG tablet     . temazepam (RESTORIL) 30 MG capsule Take 30 mg by mouth at bedtime as needed. For sleep    . tiZANidine (ZANAFLEX) 4 MG tablet TAKE 1 TABLET BY MOUTH AT BEDTIME 90 tablet 1  . torsemide (DEMADEX) 20 MG tablet Take 20 mg by mouth daily.      [name] venlafaxine XR (EFFEXOR-XR) 75 MG 24 hr capsule TK 1 C PO BID  0   No current facility-administered medications for this visit.    Allergies  Allergen Reactions  . Ace Inhibitors Other (See Comments)    Worsening renal function   .  Codeine   . Codeine-Guaifenesin [Guaifenesin-Codeine]     Some nausea--but ok with percocet    Social History   Social History  . Marital Status: Married    Spouse Name: N/A  . Number of Children: 2  . Years of Education: 10   Occupational History  . housewife    Social History Main Topics  . Smoking status: Current Every Day Smoker -- 1.50 packs/day for 35 years    Types: Cigarettes  . Smokeless tobacco: Never Used     Comment: 1-1.5 ppd (10/02/13)  . Alcohol Use: No  . Drug Use: No  . Sexual Activity: Not on file   Other Topics Concern  . 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   The patient has a 10th grade education.   The patient is left-handed.   The  patient has two children.   Patient drinks three cups of soda daily.         Review of Systems: General: negative for chills, fever, night sweats or weight changes.  Cardiovascular: negative for chest pain, dyspnea on exertion, edema, orthopnea, palpitations, paroxysmal nocturnal dyspnea or shortness of breath Dermatological: negative for rash Respiratory: negative for cough or wheezing Urologic: negative for hematuria Abdominal: negative for nausea, vomiting, diarrhea, bright red blood per rectum, melena, or hematemesis Neurologic: negative for visual changes, syncope, or dizziness All other systems reviewed and are otherwise negative except as noted above.    Blood pressure 164/94, pulse 63, height '5\' 1"'$  (1.549 m), weight 80.287 kg (177 lb).  General appearance: alert, cooperative and no distress, obese Neck: no carotid bruit and no JVD Lungs: clear to auscultation bilaterally Heart: regular rate and rhythm, S1, S2 normal, no murmur, click, rub or gallop Extremities: no LEE Pulses: 2+ and symmetric Skin: warm and dry Neurologic: Grossly normal  EKG today: NSR rate 63. LAD, pulmonary disease pattern. Nonspecific TWA. I have personally reviewed and interpreted this study.   ASSESSMENT AND PLAN:   1. CAD: Prior history of extensive stenting of her RCA. Her last left heart catheterization in 2013 demonstrated nonobstructive CAD, unchanged compared to prior cardiac catheterization in 2011. Her stents were widely patent. Normal LV function. She denies any recurrent anginal symptoms since that time. Continue medical therapy with aspirin, Plavix, beta blocker and statin.  2. Hypertension: BP is elevated but she is quite upset today. She has follow up with Dr. Melford Aase next week and will repeat BP reading then.  3. Hyperlipidemia: This is followed by her PCP. Continue Crestor.  4. Tobacco abuse: Strongly encourage complete smoking cessation.   I will follow up in one year.    [name]  [facility] 05/30/2015 4:44 PM

## 2015-05-30 NOTE — Addendum Note (Signed)
Addended by: Kathyrn Lass on: 05/30/2015 05:00 PM   Modules accepted: Orders

## 2015-05-31 ENCOUNTER — Other Ambulatory Visit: Payer: Self-pay | Admitting: Cardiology

## 2015-05-31 NOTE — Telephone Encounter (Signed)
Rx(s) sent to pharmacy electronically.  

## 2015-12-28 ENCOUNTER — Other Ambulatory Visit: Payer: Self-pay | Admitting: Cardiology

## 2016-01-28 ENCOUNTER — Other Ambulatory Visit: Payer: Self-pay | Admitting: Cardiology

## 2016-01-28 NOTE — Telephone Encounter (Signed)
Rx(s) sent to pharmacy electronically.  

## 2016-03-12 ENCOUNTER — Ambulatory Visit: Payer: Self-pay | Admitting: Nurse Practitioner

## 2016-03-13 ENCOUNTER — Encounter: Payer: Self-pay | Admitting: Nurse Practitioner

## 2016-03-17 ENCOUNTER — Other Ambulatory Visit: Payer: Self-pay | Admitting: Family Medicine

## 2016-03-17 DIAGNOSIS — Z1231 Encounter for screening mammogram for malignant neoplasm of breast: Secondary | ICD-10-CM

## 2016-03-17 DIAGNOSIS — E2839 Other primary ovarian failure: Secondary | ICD-10-CM

## 2016-03-25 ENCOUNTER — Other Ambulatory Visit: Payer: Self-pay | Admitting: Nurse Practitioner

## 2016-03-27 ENCOUNTER — Encounter: Payer: Self-pay | Admitting: Nurse Practitioner

## 2016-03-27 ENCOUNTER — Ambulatory Visit (INDEPENDENT_AMBULATORY_CARE_PROVIDER_SITE_OTHER): Payer: BLUE CROSS/BLUE SHIELD | Admitting: Nurse Practitioner

## 2016-03-27 VITALS — BP 128/61 | HR 52 | Ht 61.0 in | Wt 186.8 lb

## 2016-03-27 DIAGNOSIS — R209 Unspecified disturbances of skin sensation: Secondary | ICD-10-CM | POA: Diagnosis not present

## 2016-03-27 DIAGNOSIS — R269 Unspecified abnormalities of gait and mobility: Secondary | ICD-10-CM | POA: Diagnosis not present

## 2016-03-27 MED ORDER — GABAPENTIN 600 MG PO TABS: 600.0000 mg | ORAL_TABLET | Freq: Four times a day (QID) | ORAL | 11 refills | Status: DC

## 2016-03-27 NOTE — Progress Notes (Signed)
[facility]  PATIENT: Maria Arroyo DOB: 03/19/1951   REASON FOR VISIT: Follow-up for disturbance in skin, abnormality of gait, peripheral neuropathy HISTORY FROM: Patient    HISTORY OF PRESENT ILLNESS:Maria Arroyo, 65 year old female returns for yearly followup.  She continues to have complaints of hypersensitivity of the skin arms greater than legs and some muscle cramping involving the arms and legs.  She remains on gabapentin 600 (4) times daily. She also has fibromyalgia. She is on Soma 4 times a day for cramping.She is sleeping better with trazodone  ordered by her primary care. Her neurologic symptoms have not changed. She gets no regular exercise, she has not had any falls.Also has  gout. She returns for reevaluation and refills   HISTORY: of a cervical myelopathy, status post decompressive surgery in November 2012. MRI evaluation of the cervical spine done prior to surgery shows evidence of cord edema at the C4-5 level. MRI of the brain was also done, and this was unremarkable. The patient has continued to have complaints of hypersensitivity of the skin of the arms greater than the legs. The patient has crawling sensations throughout the body, including the left lower back. The patient has some fecal incontinence, but she indicates that the bladder is working fairly well. The patient has some mild gait instability. The patient reports a pressure sensation in the eyes, and crawling sensations along the head. The patient denies any increased symptoms with neck flexion or extension. The patient reports that she has muscle cramps involving the arms and legs. The patient is on Vytorin, but she says that she has been on this medication for several years and never had problems with muscle cramps. Niacin was recently added and complains of flushing. The patient indicates that she does not sleep well at times.    REVIEW OF SYSTEMS: Full 14 system review of systems  performed and notable only for those listed, all others are neg:  Constitutional: neg  Cardiovascular: neg Ear/Nose/Throat: neg  Skin: neg Eyes: neg Respiratory: neg Gastroitestinal: neg  Hematology/Lymphatic: neg  Endocrine: neg Musculoskeletal:joint pain,  walking difficulty Allergy/Immunology: neg Neurological: neg Psychiatric: neg Sleep : neg   ALLERGIES: Allergies  Allergen Reactions  . Ace Inhibitors Other (See Comments)    Worsening renal function   . Codeine   . Codeine-Guaifenesin [Guaifenesin-Codeine]     Some nausea--but ok with percocet    HOME MEDICATIONS: Outpatient Medications Prior to Visit  Medication Sig Dispense Refill  . allopurinol (ZYLOPRIM) 100 MG tablet Take 100 mg by mouth daily.    [name] amLODipine (NORVASC) 10 MG tablet Take 10 mg by mouth daily.     [name] aspirin 81 MG tablet Take 81 mg by mouth daily.    . Calcium Carbonate-Vitamin D (CALCIUM 600 + D PO) Take 1 tablet by mouth daily.     . carisoprodol (SOMA) 350 MG tablet Take 350 mg by mouth 4 (four) times daily as needed. For muscle spasms    . Cholecalciferol (VITAMIN D) 2000 UNITS tablet Take 4,000 Units by mouth daily.     . clopidogrel (PLAVIX) 75 MG tablet TAKE 1 TABLET(75 MG) BY MOUTH DAILY 30 tablet 5  . gabapentin (NEURONTIN) 600 MG tablet Take 1 tablet (600 mg total) by mouth 4 (four) times daily. At 8am 1pm 6pm and 10pm 120 tablet 11  . labetalol (NORMODYNE) 100 MG tablet Take 100 mg by mouth as directed.     . Multiple Vitamin (MULTIVITAMIN) tablet Take 1 tablet by mouth  daily.     . nitroGLYCERIN (NITROSTAT) 0.4 MG SL tablet Place 1 tablet (0.4 mg total) under the tongue every 5 (five) minutes as needed. For chest pain 100 tablet 3  . rosuvastatin (CRESTOR) 20 MG tablet     . tiZANidine (ZANAFLEX) 4 MG tablet TAKE 1 TABLET BY MOUTH AT BEDTIME 90 tablet 1  . torsemide (DEMADEX) 20 MG tablet Take 20 mg by mouth daily.      [name] venlafaxine XR (EFFEXOR-XR) 75 MG 24 hr capsule TK 1 C PO BID  0   . temazepam (RESTORIL) 30 MG capsule Take 30 mg by mouth at bedtime as needed. For sleep     No facility-administered medications prior to visit.     PAST MEDICAL HISTORY: Past Medical History:  Diagnosis Date  . Cervical disc disease   . COPD (chronic obstructive pulmonary disease) ([place])   . Coronary artery disease   . Depression   . Gout    R foot and knee  . Hyperlipidemia   . Hypertension   . Neuropathy ([place])   . OA (osteoarthritis)   . Tobacco abuse     PAST SURGICAL HISTORY: Past Surgical History:  Procedure Laterality Date  . ABDOMINAL HYSTERECTOMY    . ANTERIOR CERVICAL DECOMP/DISCECTOMY FUSION  02/06/2011   Procedure: ANTERIOR CERVICAL DECOMPRESSION/DISCECTOMY FUSION 2 LEVELS;  Surgeon: Winfield Cunas;  Location: [place] [facility];  Service: Neurosurgery;  Laterality: N/A;  Anterior Cervical Four-Five/Five-Six Decompression and Fusion with Plating and Bonegraft  . BLADDER SURGERY    . CARDIAC CATHETERIZATION  04/09/2009   EF 60%  . CARDIAC CATHETERIZATION  03/24/2001   EF 55%  . CARDIAC CATHETERIZATION  01/06/2001   EF 65%  . CHOLECYSTECTOMY    . CORONARY STENT PLACEMENT  03/2009   STENTING OF THE PROXIMAL TO MID RIGHT CORONARY  . LEFT HEART CATHETERIZATION WITH CORONARY ANGIOGRAM N/A 12/21/2011   Procedure: LEFT HEART CATHETERIZATION WITH CORONARY ANGIOGRAM;  Surgeon: Peter M Martinique, MD;  Location: [facility] CATH LAB;  Service: Cardiovascular;  Laterality: N/A;  . OTHER SURGICAL HISTORY  12/2014   R foot cyst removal    FAMILY HISTORY: Family History  Problem Relation Age of Onset  . Aneurysm Mother   . Heart attack Father   . Heart failure Father   . Stroke Sister   . Heart attack Brother     SOCIAL HISTORY: Social History   Social History  . Marital status: Married    Spouse name: N/A  . Number of children: 2  . Years of education: 10   Occupational History  . housewife Unemployed   Social History Main Topics  . Smoking status: Current Every Day Smoker      Packs/day: 1.50    Years: 35.00    Types: Cigarettes  . Smokeless tobacco: Never Used     Comment: 1-1.5 ppd (10/02/13)  . Alcohol use No  . Drug use: No  . Sexual activity: Not on file   Other Topics Concern  . 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   The patient has a 10th grade education.   The patient is left-handed.   The patient has two children.   Patient drinks three cups of soda daily.         PHYSICAL EXAM  Vitals:   03/27/16 0848  BP: 128/61  Pulse: (!) 52  Weight: 186 lb 12.8 oz (84.7 kg)  Height:  $'5\' 1"'O$  (1.549 m)   Body mass index is 35.3 kg/m. Generalized: Well developed, moderately obese female in no acute distress  Neck: Supple,with full ROM  Skin no peripheral edema  Neurological examination   Mentation: Alert oriented to time, place, history taking. Follows all commands speech and language fluent  Cranial nerve II-XII: Pupils were equal round reactive to light extraocular movements were full, visual field were full on confrontational test. Facial sensation and strength were normal. hearing was intact to finger rubbing bilaterally. Uvula tongue midline. head turning and shoulder shrug were normal and symmetric.Tongue protrusion into cheek strength was normal. Motor: normal bulk and tone, full strength in the BUE, BLE, fine finger movements normal, no pronator drift. No focal weakness Sensory: normal and symmetric to light touch, decreased pinprick to midshin , vibration in upper and lower extremities normal Coordination: finger-nose-finger, heel-to-shin bilaterally, no dysmetria Reflexes: Symmetric upper and lower, plantar responses were flexor bilaterally. Gait and Station: Rising up from seated position without assistance, normal stance, moderate stride, good arm swing, smooth turning, able to perform tiptoe, and heel walking without difficulty. Tandem gait is unsteady. No assistive  device   DIAGNOSTIC DATA (LABS, IMAGING, TESTING) -  ASSESSMENT AND PLAN 65 y.o. year old female has a past medical history of Coronary artery disease; COPD (chronic obstructive pulmonary disease); Hypertension; Hyperlipidemia; Tobacco abuse; OA (osteoarthritis); Depression; Neuropathy; and Cervical disc disease. here to follow-up  PLAN: Continue gabapentin at current dose will refill Needs to perform chair exercises or walk daily for overall general health Stay well-hydrated by drinking water Follow-up yearly and when necessary Dennie Bible, [facility], [facility], [facility] [address], [place], [place] [address] [phone]

## 2016-03-27 NOTE — Patient Instructions (Signed)
Continue gabapentin at current dose will refill Needs to perform chair exercises or walk daily for overall general health Stay well-hydrated by drinking water Follow-up yearly and when necessary

## 2016-03-27 NOTE — Progress Notes (Signed)
I have read the note, and I agree with the clinical assessment and plan.  Cyrstal Leitz KEITH   

## 2016-07-16 ENCOUNTER — Other Ambulatory Visit: Payer: Self-pay | Admitting: Cardiology

## 2016-07-16 NOTE — Telephone Encounter (Signed)
REFILL 

## 2016-08-03 ENCOUNTER — Other Ambulatory Visit: Payer: Self-pay

## 2016-08-03 DIAGNOSIS — I2510 Atherosclerotic heart disease of native coronary artery without angina pectoris: Secondary | ICD-10-CM

## 2016-08-03 MED ORDER — NITROGLYCERIN 0.4 MG SL SUBL: 0.4000 mg | SUBLINGUAL_TABLET | SUBLINGUAL | 0 refills | Status: DC | PRN

## 2016-08-21 ENCOUNTER — Other Ambulatory Visit: Payer: Self-pay

## 2016-08-21 MED ORDER — CLOPIDOGREL BISULFATE 75 MG PO TABS: 75.0000 mg | ORAL_TABLET | Freq: Every day | ORAL | 0 refills | Status: DC

## 2016-10-08 ENCOUNTER — Telehealth: Payer: Self-pay | Admitting: *Deleted

## 2016-10-08 NOTE — Telephone Encounter (Signed)
Per Dr. Martinique, ok to stop plavix and ASA 5 days prior to surgery and restart 1 day after surgery.  No bridge needed.  Form faxed to # provided.

## 2016-10-08 NOTE — Telephone Encounter (Signed)
Requesting surgical clearance:  1. Type of surgery: Bilateral upper eyelid ptosis repair  2. Surgeon: N/A  3.Surgical Date:10/20/16    4. Medications that need to be held: Plavix and ASA   5. CAD: Yes  6. I will defer to:  Dr. Martinique   Contact Information: Fax # [phone]

## 2016-10-16 ENCOUNTER — Telehealth: Payer: Self-pay | Admitting: Cardiology

## 2016-10-16 NOTE — Telephone Encounter (Signed)
[facility] calling again ask that we fax surgical clearance to [phone]  Note per [facility] Requesting surgical clearance:  1. Type of surgery: Bilateral upper eyelid ptosis repair  2. Surgeon: N/A  3.Surgical Date:10/20/16                        4. Medications that need to be held: Plavix and ASA   5. CAD: Yes

## 2016-10-19 NOTE — Telephone Encounter (Signed)
She is cleared for ptosis repair. May hold Plavix for 5 days. No need to hold ASA.  Peter Martinique MD, [facility]

## 2016-10-19 NOTE — Telephone Encounter (Signed)
Clearance note faxed to [facility] at fax # [phone].

## 2016-11-12 ENCOUNTER — Telehealth: Payer: Self-pay

## 2016-11-12 NOTE — Telephone Encounter (Signed)
Received a call back from patient.She stated she was doing good no complaints.Follow up appointment scheduled with Dr.Jordan 01/13/17 at 2:20 pm.

## 2016-11-12 NOTE — Telephone Encounter (Signed)
Called patient left message on personal voice mail to return my call needs to schedule follow up appointment with Dr.Jordan.

## 2016-12-17 ENCOUNTER — Other Ambulatory Visit: Payer: Self-pay

## 2016-12-17 MED ORDER — CLOPIDOGREL BISULFATE 75 MG PO TABS: 75.0000 mg | ORAL_TABLET | Freq: Every day | ORAL | 0 refills | Status: DC

## 2017-01-04 ENCOUNTER — Encounter: Payer: Self-pay | Admitting: Cardiology

## 2017-01-11 NOTE — Progress Notes (Signed)
Patient ID: Maria Arroyo, female   DOB: 09-17-1950, 66 y.o.   MRN: [id number]    01/13/2017 Maria Arroyo   01/07/1951  [id number]  Primary Physicia [name], MD Primary Cardiologist: Dr. Martinique  HPI:  The patient is seen for followup CAD.   She has a history of coronary artery disease and status post stenting of the mid right coronary artery in December of 1999 with a 3.0 x 25 mm NIR Primo stent. The proximal right coronary was stented in October of 2002 with a 3.0 x 23 mm Zeta stent. In January of 2011 she had a new high-grade stenosis in the mid right coronary between the prior stents and this was stented using a 3.0 x 18 mm Promus stent. In September 2013, she had repeat cardiac catheterization. This showed nonobstructive coronary disease. There was modest disease throughout the mid RCA that was unchanged compared to her prior cardiac catheterization 2011. The remainder of the stents were widely patent. She was noted to have normal left ventricular function with an ejection fraction of 55-60%.  Since that time she has been maintained on dual antiplatelet therapy with aspirin plus Plavix, amlodipine and labetalol. Her history is also significant for hypertension and ongoing tobacco use.   On followup today she states she is doing OK. She did stop smoking for 4 months but resumed when her alcoholic son was causing her a lot of stress. She denies any chest pain or increased dyspnea. No swelling. Notes occ. Indigestion after eating relieved with antacid therapy.  Current Outpatient Prescriptions  Medication Sig Dispense Refill  . allopurinol (ZYLOPRIM) 100 MG tablet Take 100 mg by mouth daily.    [name] amLODipine (NORVASC) 10 MG tablet Take 10 mg by mouth daily.     [name] aspirin 81 MG tablet Take 81 mg by mouth daily.    . Calcium Carbonate-Vitamin D (CALCIUM 600 + D PO) Take 1 tablet by mouth daily.     . carisoprodol (SOMA) 350 MG tablet Take 350 mg by mouth 4 (four) times daily as  needed. For muscle spasms    . Cholecalciferol (VITAMIN D) 2000 UNITS tablet Take 4,000 Units by mouth daily.     . clopidogrel (PLAVIX) 75 MG tablet Take 1 tablet (75 mg total) by mouth daily. 90 tablet 0  . gabapentin (NEURONTIN) 600 MG tablet Take 1 tablet (600 mg total) by mouth 4 (four) times daily. At 8am 1pm 6pm and 10pm 120 tablet 11  . labetalol (NORMODYNE) 100 MG tablet Take 100 mg by mouth as directed.     [name] losartan (COZAAR) 50 MG tablet Take 50 mg by mouth daily.   4  . Multiple Vitamin (MULTIVITAMIN) tablet Take 1 tablet by mouth daily.     . nitroGLYCERIN (NITROSTAT) 0.4 MG SL tablet Place 1 tablet (0.4 mg total) under the tongue every 5 (five) minutes as needed. For chest pain 100 tablet 0  . nystatin cream (MYCOSTATIN) APP EXT AA BID prn  1  . oxyCODONE-acetaminophen (PERCOCET) 10-325 MG tablet TK 1 T PO QID prn  0  . PROVENTIL HFA 108 (90 Base) MCG/ACT inhaler Inhale 1-2 puffs into the lungs. Prn  4  . tiZANidine (ZANAFLEX) 4 MG tablet TAKE 1 TABLET BY MOUTH AT BEDTIME 90 tablet 1  . torsemide (DEMADEX) 20 MG tablet Take 20 mg by mouth daily.      [name] venlafaxine XR (EFFEXOR-XR) 75 MG 24 hr capsule TK 1 C PO BID  0  .  rosuvastatin (CRESTOR) 40 MG tablet Take 1 tablet (40 mg total) by mouth daily. 90 tablet 3   No current facility-administered medications for this visit.     Allergies  Allergen Reactions  . Ace Inhibitors Other (See Comments)    Worsening renal function   . Codeine   . Codeine-Guaifenesin [Guaifenesin-Codeine]     Some nausea--but ok with percocet    Social History   Social History  . Marital status: Married    Spouse name: N/A  . Number of children: 2  . Years of education: 10   Occupational History  . housewife Unemployed   Social History Main Topics  . Smoking status: Current Every Day Smoker    Packs/day: 1.50    Years: 35.00    Types: Cigarettes  . Smokeless tobacco: Never Used     Comment: 1-1.5 ppd (10/02/13)  . Alcohol use No  .  Drug use: No  . Sexual activity: Not on file   Other Topics Concern  . 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   The patient has a 10th grade education.   The patient is left-handed.   The patient has two children.   Patient drinks three cups of soda daily.         Review of Systems: As noted in HPI. All other systems reviewed and are otherwise negative except as noted above.    Blood pressure (!) 150/90, pulse (!) 52, height 5\' 1"  (1.549 m), weight 188 lb (85.3 kg).  GENERAL:  Well appearing, obese, NAD HEENT:  PERRL, EOMI, sclera are clear. Oropharynx is clear. NECK:  No jugular venous distention, carotid upstroke brisk and symmetric, no bruits, no thyromegaly or adenopathy LUNGS:  Clear to auscultation bilaterally CHEST:  Unremarkable HEART:  RRR,  PMI not displaced or sustained,S1 and S2 within normal limits, no S3, no S4: no clicks, no rubs, no murmurs ABD:  Soft, nontender. BS +, no masses or bruits. No hepatomegaly, no splenomegaly EXT:  2 + pulses throughout, no edema, no cyanosis no clubbing SKIN:  Warm and dry.  No rashes NEURO:  Alert and oriented x 3. Cranial nerves II through XII intact. PSYCH:  Cognitively intact    EKG today: NSR rate 52. Normal Ecg. I have personally reviewed and interpreted this study.  Labs dated 09/14/16: cholesterol 164, triglycerides 157, HDL 46, LDL 87. Glucose 182. Creatinine 1.32. Otherwise CMET normal.  Dated 12/16/16: A1c 7.3%.    ASSESSMENT AND PLAN:   1. CAD: Prior history of extensive stenting of her RCA. Her last left heart catheterization in 2013 demonstrated nonobstructive CAD, unchanged compared to prior cardiac catheterization in 2011. Normal LV function. She denies any recurrent anginal symptoms since that time. Continue aggressive medical therapy with aspirin, Plavix, beta blocker and statin. Smoking cessation.  2. Hypertension: BP is elevated but has been well  controlled. On multiple agents.  She has follow up with Dr. Melford Aase  and will repeat BP reading then.  3. Hyperlipidemia: LDL 87 on Crestor. Goal <70. Will increase Crestor to 40 mg daily.   4. Tobacco abuse: continue efforts at complete smoking cessation.   I will follow up in 6 months.    [name] [facility] 01/13/2017 2:47 PM

## 2017-01-13 ENCOUNTER — Ambulatory Visit (INDEPENDENT_AMBULATORY_CARE_PROVIDER_SITE_OTHER): Payer: BLUE CROSS/BLUE SHIELD | Admitting: Cardiology

## 2017-01-13 ENCOUNTER — Encounter: Payer: Self-pay | Admitting: Cardiology

## 2017-01-13 VITALS — BP 150/90 | HR 52 | Ht 61.0 in | Wt 188.0 lb

## 2017-01-13 DIAGNOSIS — J449 Chronic obstructive pulmonary disease, unspecified: Secondary | ICD-10-CM

## 2017-01-13 DIAGNOSIS — E7800 Pure hypercholesterolemia, unspecified: Secondary | ICD-10-CM | POA: Diagnosis not present

## 2017-01-13 DIAGNOSIS — Z720 Tobacco use: Secondary | ICD-10-CM

## 2017-01-13 DIAGNOSIS — I10 Essential (primary) hypertension: Secondary | ICD-10-CM

## 2017-01-13 DIAGNOSIS — I2510 Atherosclerotic heart disease of native coronary artery without angina pectoris: Secondary | ICD-10-CM

## 2017-01-13 MED ORDER — ROSUVASTATIN CALCIUM 40 MG PO TABS: 40.0000 mg | ORAL_TABLET | Freq: Every day | ORAL | 3 refills | Status: DC

## 2017-01-13 NOTE — Patient Instructions (Signed)
We will increase Crestor to 40 mg daily  Continue your other therapy  Quit smoking.   I will see you in 6 months/.

## 2017-03-12 ENCOUNTER — Other Ambulatory Visit: Payer: Self-pay | Admitting: *Deleted

## 2017-03-12 MED ORDER — CLOPIDOGREL BISULFATE 75 MG PO TABS: 75.0000 mg | ORAL_TABLET | Freq: Every day | ORAL | 1 refills | Status: DC

## 2017-03-17 NOTE — Progress Notes (Signed)
[facility]  PATIENT: Maria Arroyo DOB: 06-13-1950   REASON FOR VISIT: Follow-up for disturbance in skin, abnormality of gait, peripheral neuropathy HISTORY FROM: Patient    HISTORY OF PRESENT ILLNESS:Ms. Maria Arroyo, 66 year old female returns for yearly followup.  She continues to have complaints of hypersensitivity of the skin arms greater than legs and some muscle cramping involving the arms and legs.  She remains on gabapentin 600 (4) times daily. She also has fibromyalgia. She is on Soma 4 times a day for cramping.She is sleeping better with trazodone  ordered by her primary care. Her neurologic symptoms have not changed. She gets no regular exercise, she has not had any falls.Also has  gout.  She continues to smoke and was encouraged to stop . She returns for reevaluation and refills   HISTORY: of a cervical myelopathy, status post decompressive surgery in November 2012. MRI evaluation of the cervical spine done prior to surgery shows evidence of cord edema at the C4-5 level. MRI of the brain was also done, and this was unremarkable. The patient has continued to have complaints of hypersensitivity of the skin of the arms greater than the legs. The patient has crawling sensations throughout the body, including the left lower back. The patient has some fecal incontinence, but she indicates that the bladder is working fairly well. The patient has some mild gait instability. The patient reports a pressure sensation in the eyes, and crawling sensations along the head. The patient denies any increased symptoms with neck flexion or extension. The patient reports that she has muscle cramps involving the arms and legs. The patient is on Vytorin, but she says that she has been on this medication for several years and never had problems with muscle cramps. Niacin was recently added and complains of flushing. The patient indicates that she does not sleep well at times.    REVIEW  OF SYSTEMS: Full 14 system review of systems performed and notable only for those listed, all others are neg:  Constitutional: neg  Cardiovascular: neg Ear/Nose/Throat: neg  Skin: neg Eyes: neg Respiratory: neg Gastroitestinal: neg  Hematology/Lymphatic: neg  Endocrine: neg Musculoskeletal:joint pain,  walking difficulty Allergy/Immunology: neg Neurological: neg Psychiatric: neg Sleep : neg   ALLERGIES: Allergies  Allergen Reactions  . Ace Inhibitors Other (See Comments)    Worsening renal function   . Codeine   . Codeine-Guaifenesin [Guaifenesin-Codeine]     Some nausea--but ok with percocet    HOME MEDICATIONS: Outpatient Medications Prior to Visit  Medication Sig Dispense Refill  . allopurinol (ZYLOPRIM) 100 MG tablet Take 100 mg by mouth daily.    [name] amLODipine (NORVASC) 10 MG tablet Take 10 mg by mouth daily.     [name] aspirin 81 MG tablet Take 81 mg by mouth daily.    . Calcium Carbonate-Vitamin D (CALCIUM 600 + D PO) Take 1 tablet by mouth daily.     . carisoprodol (SOMA) 350 MG tablet Take 350 mg by mouth 4 (four) times daily as needed. For muscle spasms    . Cholecalciferol (VITAMIN D) 2000 UNITS tablet Take 4,000 Units by mouth daily.     . clopidogrel (PLAVIX) 75 MG tablet Take 1 tablet (75 mg total) by mouth daily. 90 tablet 1  . gabapentin (NEURONTIN) 600 MG tablet Take 1 tablet (600 mg total) by mouth 4 (four) times daily. At 8am 1pm 6pm and 10pm 120 tablet 11  . labetalol (NORMODYNE) 100 MG tablet Take 100 mg by mouth as directed.     [name]  losartan (COZAAR) 50 MG tablet Take 50 mg by mouth daily.   4  . Multiple Vitamin (MULTIVITAMIN) tablet Take 1 tablet by mouth daily.     . nitroGLYCERIN (NITROSTAT) 0.4 MG SL tablet Place 1 tablet (0.4 mg total) under the tongue every 5 (five) minutes as needed. For chest pain 100 tablet 0  . nystatin cream (MYCOSTATIN) APP EXT AA BID prn  1  . oxyCODONE-acetaminophen (PERCOCET) 10-325 MG tablet TK 1 T PO QID prn  0  . PROVENTIL  HFA 108 (90 Base) MCG/ACT inhaler Inhale 1-2 puffs into the lungs. Prn  4  . rosuvastatin (CRESTOR) 40 MG tablet Take 1 tablet (40 mg total) by mouth daily. 90 tablet 3  . tiZANidine (ZANAFLEX) 4 MG tablet TAKE 1 TABLET BY MOUTH AT BEDTIME 90 tablet 1  . torsemide (DEMADEX) 20 MG tablet Take 20 mg by mouth daily.      [name] venlafaxine XR (EFFEXOR-XR) 75 MG 24 hr capsule TK 1 C PO BID  0   No facility-administered medications prior to visit.     PAST MEDICAL HISTORY: Past Medical History:  Diagnosis Date  . Cervical disc disease   . COPD (chronic obstructive pulmonary disease) ([place])   . Coronary artery disease   . Depression   . Gout    R foot and knee  . Hyperlipidemia   . Hypertension   . Neuropathy   . OA (osteoarthritis)   . Tobacco abuse     PAST SURGICAL HISTORY: Past Surgical History:  Procedure Laterality Date  . ABDOMINAL HYSTERECTOMY    . ANTERIOR CERVICAL DECOMP/DISCECTOMY FUSION  02/06/2011   Procedure: ANTERIOR CERVICAL DECOMPRESSION/DISCECTOMY FUSION 2 LEVELS;  Surgeon: Winfield Cunas;  Location: [facility];  Service: Neurosurgery;  Laterality: N/A;  Anterior Cervical Four-Five/Five-Six Decompression and Fusion with Plating and Bonegraft  . BLADDER SURGERY    . CARDIAC CATHETERIZATION  04/09/2009   EF 60%  . CARDIAC CATHETERIZATION  03/24/2001   EF 55%  . CARDIAC CATHETERIZATION  01/06/2001   EF 65%  . CHOLECYSTECTOMY    . CORONARY STENT PLACEMENT  03/2009   STENTING OF THE PROXIMAL TO MID RIGHT CORONARY  . LEFT HEART CATHETERIZATION WITH CORONARY ANGIOGRAM N/A 12/21/2011   Procedure: LEFT HEART CATHETERIZATION WITH CORONARY ANGIOGRAM;  Surgeon: Peter M Martinique, MD;  Location: [facility];  Service: Cardiovascular;  Laterality: N/A;  . OTHER SURGICAL HISTORY  12/2014   R foot cyst removal    FAMILY HISTORY: Family History  Problem Relation Age of Onset  . Aneurysm Mother   . Heart attack Father   . Heart failure Father   . Stroke Sister   . Heart attack  Brother     SOCIAL HISTORY: Social History   Socioeconomic History  . Marital status: Married    Spouse name: Not on file  . Number of children: 2  . Years of education: 10  . Highest education level: Not on file  Social Needs  . Financial resource strain: Not on file  . Food insecurity - worry: Not on file  . Food insecurity - inability: Not on file  . Transportation needs - medical: Not on file  . Transportation needs - non-medical: Not on file  Occupational History  . Occupation: housewife    Employer: UNEMPLOYED  Tobacco Use  . Smoking status: Current Every Day Smoker    Packs/day: 1.50    Years: 35.00    Pack years: 52.50    Types: Cigarettes  .  Smokeless tobacco: Never Used  . Tobacco comment: 1-1.5 ppd (10/02/13)  Substance and Sexual Activity  . Alcohol use: No    Alcohol/week: 0.0 oz  . Drug use: No  . Sexual activity: Not on file  Other Topics Concern  . 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   The patient has a 10th grade education.   The patient is left-handed.   The patient has two children.   Patient drinks three cups of soda daily.         PHYSICAL EXAM  Vitals:   03/18/17 1119  BP: 97/61  Pulse: (!) 54  Weight: 185 lb 9.6 oz (84.2 kg)  Height: 5\' 1"  (1.549 m)   Body mass index is 35.07 kg/m. Generalized: Well developed, moderately obese female in no acute distress  Neck: Supple,with full ROM  Skin no peripheral edema  Neurological examination   Mentation: Alert oriented to time, place, history taking. Follows all commands speech and language fluent  Cranial nerve II-XII: Pupils were equal round reactive to light extraocular movements were full, visual field were full on confrontational test. Facial sensation and strength were normal. hearing was intact to finger rubbing bilaterally. Uvula tongue midline. head turning and shoulder shrug were normal and symmetric.Tongue  protrusion into cheek strength was normal. Motor: normal bulk and tone, full strength in the BUE, BLE, fine finger movements normal, no pronator drift. No focal weakness Sensory: normal and symmetric to light touch, decreased pinprick to midshin , vibration in upper and lower extremities normal Coordination: finger-nose-finger, heel-to-shin bilaterally, no dysmetria Reflexes: Symmetric upper and lower, plantar responses were flexor bilaterally. Gait and Station: Rising up from seated position without assistance, normal stance, moderate stride, good arm swing, smooth turning, able to perform tiptoe, and heel walking without difficulty. Tandem gait is unsteady. No assistive device   DIAGNOSTIC DATA (LABS, IMAGING, TESTING) -  ASSESSMENT AND PLAN 66 y.o. year old female has a past medical history of Coronary artery disease; COPD (chronic obstructive pulmonary disease); Hypertension; Hyperlipidemia; Tobacco abuse; OA (osteoarthritis); Depression; Neuropathy; and Cervical disc disease here to follow-up  PLAN: Continue gabapentin at current dose will refill for 1 year Needs to perform chair exercises or walk daily for overall general health Stay well-hydrated by drinking water Stop smoking, smoking keeps blood from getting to damage to nerves Follow-up yearly and when necessary Dennie Bible, [facility], [facility], APRN  [facility] [address], [place], [place] [address] [phone]

## 2017-03-18 ENCOUNTER — Encounter: Payer: Self-pay | Admitting: Nurse Practitioner

## 2017-03-18 ENCOUNTER — Ambulatory Visit (INDEPENDENT_AMBULATORY_CARE_PROVIDER_SITE_OTHER): Payer: Medicare Other | Admitting: Nurse Practitioner

## 2017-03-18 VITALS — BP 97/61 | HR 54 | Ht 61.0 in | Wt 185.6 lb

## 2017-03-18 DIAGNOSIS — R269 Unspecified abnormalities of gait and mobility: Secondary | ICD-10-CM | POA: Diagnosis not present

## 2017-03-18 DIAGNOSIS — R209 Unspecified disturbances of skin sensation: Secondary | ICD-10-CM

## 2017-03-18 DIAGNOSIS — G629 Polyneuropathy, unspecified: Secondary | ICD-10-CM | POA: Insufficient documentation

## 2017-03-18 DIAGNOSIS — I2510 Atherosclerotic heart disease of native coronary artery without angina pectoris: Secondary | ICD-10-CM

## 2017-03-18 MED ORDER — GABAPENTIN 600 MG PO TABS: 600.0000 mg | ORAL_TABLET | Freq: Four times a day (QID) | ORAL | 11 refills | Status: DC

## 2017-03-18 NOTE — Patient Instructions (Addendum)
Continue gabapentin at current dose will refill Needs to perform chair exercises or walk daily for overall general health Stay well-hydrated by drinking water Follow-up yearly and when necessary  Peripheral Neuropathy Peripheral neuropathy is a type of nerve damage. It affects nerves that carry signals between the spinal cord and other parts of the body. These are called peripheral nerves. With peripheral neuropathy, one nerve or a group of nerves may be damaged. What are the causes? Many things can damage peripheral nerves. For some people with peripheral neuropathy, the cause is unknown. Some causes include:  Diabetes. This is the most common cause of peripheral neuropathy.  Injury to a nerve.  Pressure or stress on a nerve that lasts a long time.  Too little vitamin B. Alcoholism can lead to this.  Infections.  Autoimmune diseases, such as multiple sclerosis and systemic lupus erythematosus.  Inherited nerve diseases.  Some medicines, such as cancer drugs.  Toxic substances, such as lead and mercury.  Too little blood flowing to the legs.  Kidney disease.  Thyroid disease.  What are the signs or symptoms? Different people have different symptoms. The symptoms you have will depend on which of your nerves is damaged. Common symptoms include:  Loss of feeling (numbness) in the feet and hands.  Tingling in the feet and hands.  Pain that burns.  Very sensitive skin.  Weakness.  Not being able to move a part of the body (paralysis).  Muscle twitching.  Clumsiness or poor coordination.  Loss of balance.  Not being able to control your bladder.  Feeling dizzy.  Sexual problems.  How is this diagnosed? Peripheral neuropathy is a symptom, not a disease. Finding the cause of peripheral neuropathy can be hard. To figure that out, your health care provider will take a medical history and do a physical exam. A neurological exam will also be done. This involves  checking things affected by your brain, spinal cord, and nerves (nervous system). For example, your health care provider will check your reflexes, how you move, and what you can feel. Other types of tests may also be ordered, such as:  Blood tests.  A test of the fluid in your spinal cord.  Imaging tests, such as CT scans or an MRI.  Electromyography (EMG). This test checks the nerves that control muscles.  Nerve conduction velocity tests. These tests check how fast messages pass through your nerves.  Nerve biopsy. A small piece of nerve is removed. It is then checked under a microscope.  How is this treated?  Medicine is often used to treat peripheral neuropathy. Medicines may include: ? Pain-relieving medicines. Prescription or over-the-counter medicine may be suggested. ? Antiseizure medicine. This may be used for pain. ? Antidepressants. These also may help ease pain from neuropathy. ? Lidocaine. This is a numbing medicine. You might wear a patch or be given a shot. ? Mexiletine. This medicine is typically used to help control irregular heart rhythms.  Surgery. Surgery may be needed to relieve pressure on a nerve or to destroy a nerve that is causing pain.  Physical therapy to help movement.  Assistive devices to help movement. Follow these instructions at home:  Only take over-the-counter or prescription medicines as directed by your health care provider. Follow the instructions carefully for any given medicines. Do not take any other medicines without first getting approval from your health care provider.  If you have diabetes, work closely with your health care provider to keep your blood sugar under control.  If you have numbness in your feet: ? Check every day for signs of injury or infection. Watch for redness, warmth, and swelling. ? Wear padded socks and comfortable shoes. These help protect your feet.  Do not do things that put pressure on your damaged nerve.  Do  not smoke. Smoking keeps blood from getting to damaged nerves.  Avoid or limit alcohol. Too much alcohol can cause a lack of B vitamins. These vitamins are needed for healthy nerves.  Develop a good support system. Coping with peripheral neuropathy can be stressful. Talk to a mental health specialist or join a support group if you are struggling.  Follow up with your health care provider as directed. Contact a health care provider if:  You have new signs or symptoms of peripheral neuropathy.  You are struggling emotionally from dealing with peripheral neuropathy.  You have a fever. Get help right away if:  You have an injury or infection that is not healing.  You feel very dizzy or begin vomiting.  You have chest pain.  You have trouble breathing. This information is not intended to replace advice given to you by your health care provider. Make sure you discuss any questions you have with your health care provider. Document Released: 03/06/2002 Document Revised: 08/22/2015 Document Reviewed: 11/21/2012 Elsevier Interactive Patient Education  2017 [employer].

## 2017-03-18 NOTE — Progress Notes (Signed)
I have read the note, and I agree with the clinical assessment and plan.  Yan Pankratz K Trenda Corliss   

## 2017-08-31 ENCOUNTER — Other Ambulatory Visit: Payer: Self-pay

## 2017-08-31 MED ORDER — CLOPIDOGREL BISULFATE 75 MG PO TABS: 75.0000 mg | ORAL_TABLET | Freq: Every day | ORAL | 0 refills | Status: DC

## 2017-09-16 ENCOUNTER — Telehealth: Payer: Self-pay | Admitting: *Deleted

## 2017-09-16 NOTE — Telephone Encounter (Signed)
Dr. Martinique, this pt has history of extensive stenting of her RCA. Her last cath 2013 demonstrated nonobstructive CAD, unchanged.  She is planned for colonoscopy and they are requesting to hold Plavix for 4 days. Is this OK with you?  Please route response back to CV DIV PREOP  Thank you

## 2017-09-16 NOTE — Telephone Encounter (Signed)
PRIMARY CARDIOLOGIST   DR PETER Martinique       [facility] Pre-operative Risk Assessment    Request for surgical clearance:  1. What type of surgery is being performed? COLONOSCOPY  2. When is this surgery scheduled? 10/05/2017  3. What type of clearance is required (medical clearance vs. Pharmacy clearance to hold med vs. Both)? MEDICAL  4. Are there any medications that need to be held prior to surgery and how long? HOLD PLAVIX  4 DAYS PRIOR  5. Practice name and name of physician performing surgery? [facility].--- DR Glennon Hamilton  6. What is your office phone number [phone]   7.   What is your office fax number [facility]  8.   Anesthesia type (None, local, MAC, general) ? Leonia Corona 09/16/2017, 4:42 PM  _________________________________________________________________   (provider comments below)

## 2017-09-16 NOTE — Telephone Encounter (Signed)
Ok to hold plavix for colonoscopy.  Maria Carder Martinique MD, [facility]

## 2017-09-17 NOTE — Telephone Encounter (Signed)
Left VM for pt tal call back. Can clear if no symptoms. Remind pt that she is overdue to follow up.

## 2017-09-21 NOTE — Telephone Encounter (Signed)
New message    Pt is returning call from Friday.

## 2017-09-21 NOTE — Telephone Encounter (Signed)
   Primary Cardiologist: Peter Martinique, MD  Chart reviewed as part of pre-operative protocol coverage. Patient was contacted 09/21/2017 in reference to pre-operative risk assessment for pending surgery as outlined below.  Maria Arroyo was last seen 12/2016 by Dr. Martinique. H/o CAD s/p PCI (last cath 2013 unchanged from previous), HTN, HLD, tobacco abuse. Last OV 12/2016, doing well, recommended f/u 6 months. Since that day, Maria Arroyo has done well without any new CP or SOB. Does not exercise but remains relatively active without angina. RCRI 0.9% indicating low risk of cardiac complications. Therefore, based on ACC/AHA guidelines, the patient would be at acceptable risk for the planned procedure without further cardiovascular testing.   We were asked if we could hold Plavix 4 days prior. Dr. Martinique replied, "Ok to hold plavix for colonoscopy."  I will route this recommendation to the requesting party via [place] fax function and remove from pre-op pool.  Please call with questions.  Charlie Pitter, PA-C 09/21/2017, 2:49 PM

## 2017-11-24 ENCOUNTER — Other Ambulatory Visit: Payer: Self-pay

## 2017-11-24 ENCOUNTER — Telehealth: Payer: Self-pay | Admitting: Cardiology

## 2017-11-24 MED ORDER — CLOPIDOGREL BISULFATE 75 MG PO TABS: 75.0000 mg | ORAL_TABLET | Freq: Every day | ORAL | 0 refills | Status: DC

## 2017-11-24 NOTE — Telephone Encounter (Signed)
° ° ° °*  STAT* If patient is at the pharmacy, call can be transferred to refill team.   1. Which medications need to be refilled? (please list name of each medication and dose if known) clopidogrel (PLAVIX) 75 MG tablet  2. Which pharmacy/location (including street and city if local pharmacy) is medication to be sent to? [place], [place] - [address] AT [address]  3. Do they need a 30 day or 90 day supply? [place]

## 2017-11-25 NOTE — Telephone Encounter (Signed)
Medication was submitted 11/24/17

## 2018-01-26 ENCOUNTER — Other Ambulatory Visit: Payer: Self-pay | Admitting: *Deleted

## 2018-01-26 DIAGNOSIS — I2510 Atherosclerotic heart disease of native coronary artery without angina pectoris: Secondary | ICD-10-CM

## 2018-01-26 MED ORDER — ROSUVASTATIN CALCIUM 40 MG PO TABS: 40.0000 mg | ORAL_TABLET | Freq: Every day | ORAL | 0 refills | Status: DC

## 2018-02-04 ENCOUNTER — Ambulatory Visit (INDEPENDENT_AMBULATORY_CARE_PROVIDER_SITE_OTHER): Payer: Medicare Other | Admitting: Internal Medicine

## 2018-02-04 ENCOUNTER — Encounter: Payer: Self-pay | Admitting: Internal Medicine

## 2018-02-04 DIAGNOSIS — I2510 Atherosclerotic heart disease of native coronary artery without angina pectoris: Secondary | ICD-10-CM | POA: Diagnosis not present

## 2018-02-04 DIAGNOSIS — R911 Solitary pulmonary nodule: Secondary | ICD-10-CM | POA: Diagnosis not present

## 2018-02-04 DIAGNOSIS — F17210 Nicotine dependence, cigarettes, uncomplicated: Secondary | ICD-10-CM | POA: Diagnosis not present

## 2018-02-04 DIAGNOSIS — J449 Chronic obstructive pulmonary disease, unspecified: Secondary | ICD-10-CM | POA: Diagnosis not present

## 2018-02-04 DIAGNOSIS — I10 Essential (primary) hypertension: Secondary | ICD-10-CM

## 2018-02-04 MED ORDER — GLYCOPYRROLATE-FORMOTEROL 9-4.8 MCG/ACT IN AERO: 2.0000 | INHALATION_SPRAY | Freq: Two times a day (BID) | RESPIRATORY_TRACT | 0 refills | Status: DC

## 2018-02-04 MED ORDER — BISOPROLOL FUMARATE 5 MG PO TABS: 5.0000 mg | ORAL_TABLET | Freq: Every day | ORAL | 11 refills | Status: DC

## 2018-02-04 MED ORDER — GLYCOPYRROLATE-FORMOTEROL 9-4.8 MCG/ACT IN AERO: 2.0000 | INHALATION_SPRAY | Freq: Two times a day (BID) | RESPIRATORY_TRACT | 11 refills | Status: DC

## 2018-02-04 NOTE — Progress Notes (Signed)
Maria Arroyo, female    DOB: [birth year],    MRN: [id number]    Brief patient profile:  64 yowf active smoker with new symptom of anorexia summer of 2019 assoc with 25 lb wt loss ?  p starting metformin so underwent w/u for occult ca with CT 02/02/18 and found RLL spn so referred to pulmonary clinic 02/04/2018 by Dr   Anastasia Pall    History of Present Illness  02/04/2018  Pulmonary/ 1st office eval/Malayna Noori  Chief Complaint  Patient presents with  . Pulmonary Consult    Referred by Dr. Melford Aase for eval of lung mass found incidentally on ct abd done at [facility] 02/02/18.  She has had some loss of appetite lately.  She has had a cough over the past wk- non prod.   Dyspnea:  MMRC2 = can't walk a nl pace on a flat grade s sob but does fine slow and flat eg leaning on cart (due to chronic pain) Cough: min/ non-productive Sleep: R side / flat bed/ 2 pillows SABA use: does not use   No obvious day to day or daytime variability or assoc excess/ purulent sputum or mucus plugs or hemoptysis or cp or chest tightness, subjective wheeze or overt sinus or hb symptoms.   Sleeping as above  without nocturnal  or early am exacerbation  of respiratory  c/o's or need for noct saba. Also denies any obvious fluctuation of symptoms with weather or environmental changes or other aggravating or alleviating factors except as outlined above   No unusual exposure hx or h/o childhood pna/ asthma or knowledge of premature birth.  Current Allergies, Complete Past Medical History, Past Surgical History, Family History, and Social History were reviewed in [employer] record.  ROS  The following are not active complaints unless bolded Hoarseness, sore throat, dysphagia, dental problems, itching, sneezing,  nasal congestion or discharge of excess mucus or purulent secretions, ear ache,   fever, chills, sweats, unintended wt loss or wt gain, classically pleuritic or exertional cp,  orthopnea pnd or  arm/hand swelling  or leg swelling, presyncope, palpitations, abdominal pain, anorexia, nausea, vomiting, diarrhea  or change in bowel habits or change in bladder habits, change in stools or change in urine, dysuria, hematuria,  rash, arthralgias, visual complaints, headache, numbness, weakness or ataxia or problems with walking or coordination,  change in mood or  memory.           Past Medical History:  Diagnosis Date  . Cervical disc disease   . COPD (chronic obstructive pulmonary disease) ([place])   . Coronary artery disease   . Depression   . Gout    R foot and knee  . Hyperlipidemia   . Hypertension   . Neuropathy   . OA (osteoarthritis)   . Tobacco abuse     Outpatient Medications Prior to Visit  Medication Sig Dispense Refill  . allopurinol (ZYLOPRIM) 100 MG tablet Take 100 mg by mouth daily.    [name] amLODipine (NORVASC) 10 MG tablet Take 10 mg by mouth daily.     [name] aspirin 81 MG tablet Take 81 mg by mouth daily.    . Calcium Carbonate-Vitamin D (CALCIUM 600 + D PO) Take 1 tablet by mouth daily.     . carisoprodol (SOMA) 350 MG tablet Take 350 mg by mouth 4 (four) times daily as needed. For muscle spasms    . Cholecalciferol (VITAMIN D) 2000 UNITS tablet Take 4,000 Units by mouth daily.     [name]  clopidogrel (PLAVIX) 75 MG tablet Take 1 tablet (75 mg total) by mouth daily. Please call to make appointment for further refills. 90 tablet 0  . gabapentin (NEURONTIN) 600 MG tablet Take 1 tablet (600 mg total) by mouth 4 (four) times daily. At 8am 1pm 6pm and 10pm 120 tablet 11  . labetalol (NORMODYNE) 100 MG tablet Take 100 mg by mouth as directed.     [name] losartan (COZAAR) 50 MG tablet Take 50 mg by mouth daily.   4  . Multiple Vitamin (MULTIVITAMIN) tablet Take 1 tablet by mouth daily.     . nitroGLYCERIN (NITROSTAT) 0.4 MG SL tablet Place 1 tablet (0.4 mg total) under the tongue every 5 (five) minutes as needed. For chest pain 100 tablet 0  . nystatin cream (MYCOSTATIN) APP EXT AA BID prn   1  . oxyCODONE-acetaminophen (PERCOCET) 10-325 MG tablet TK 1 T PO QID prn  0  . PROVENTIL HFA 108 (90 Base) MCG/ACT inhaler Inhale 1-2 puffs into the lungs. Prn  4  . rosuvastatin (CRESTOR) 40 MG tablet Take 1 tablet (40 mg total) by mouth daily. NEED OV. 90 tablet 0  . torsemide (DEMADEX) 20 MG tablet Take 20 mg by mouth daily.      [name] venlafaxine XR (EFFEXOR-XR) 75 MG 24 hr capsule TK 1 C PO BID  0  . tiZANidine (ZANAFLEX) 4 MG tablet TAKE 1 TABLET BY MOUTH AT BEDTIME 90 tablet 1     Objective:     BP 110/70 (BP Location: Left Arm, Cuff Size: Normal)   Pulse 65   Ht 5\' 1"  (1.549 m)   Wt 157 lb (71.2 kg)   SpO2 94%   BMI 29.66 kg/m   SpO2: 94 %  RA     HEENT: full dentures/ nl  turbinates bilaterally, and oropharynx. Nl external ear canals without cough reflex  -  Modified Mallampati Score =   1    NECK :  without JVD/Nodes/TM/ nl carotid upstrokes bilaterally   LUNGS: no acc muscle use,  Thoracic kyphosis with slt distant bs   bilaterally without cough on insp or exp maneuvers   CV:  RRR  no s3 or murmur or increase in P2, and no edema   ABD:  Obese/ soft and nontender with nl inspiratory excursion in the supine position. No bruits or organomegaly appreciated, bowel sounds nl  MS:  Nl gait/ ext warm without deformities, calf tenderness, cyanosis or clubbing No obvious joint restrictions   SKIN: warm and dry without lesions    NEURO:  alert, approp, nl sensorium with  no motor or cerebellar deficits apparent.    CXR  PA and Lateral:   02/04/2018 :    I personally reviewed images and agree with radiology impression as follows:   Did not go to xray as req      Assessment   Solitary pulmonary nodule CT chest 02/02/18  spn rll   21 x 28 spiculated  -  PET  High likelihood of ca but not at all likely this explains any of her symptoms which hopefully are nothing more than reaction to glucophage   >>>>W/u to start with pet and return for f/u in 2 weeks with full pfts  to see if resectable/operable.   Discussed in detail all the  indications, usual  risks and alternatives  relative to the benefits with patient who agrees to proceed with w/u as outlined.        COPD GOLD 0 / smoker  Spirometry 02/04/2018  FEV1 1.0 (49%)  Ratio 73 with no prior rx  - 02/04/2018  After extensive coaching inhaler device,  effectiveness =    75% (short Ti) try bevespi 2bid   She does not meet criteria for copd based on today's study but is not a good candidate for RLL due to ? Restriction     >>> rec 1) cxr/  along with walking sats on return 2) trial off normodyne to see if that makes any difference (see separate a/p)  3) bevespi 2bid trial (sample only ) x 2 weeks then full pfts  3) work on smoking cessation (see separate a/p)       Essential hypertension In the setting of respiratory symptoms of unknown etiology,  It would be preferable to use bystolic, the most beta -1  selective Beta blocker available in sample form, with bisoprolol the most selective generic choice  on the market, at least on a trial basis, to make sure the spillover Beta 2 effects of the less specific Beta blockers are not contributing to this patient's symptoms.   Rec:  Try bisoprolol 5 mg daily - if too strong break norvasc in half     Cigarette smoker 4-5 min discussion re active cigarette smoking in addition to office E&M  Ask about tobacco use:   ongoing Advise quitting   Especially given she may have lung ca and need resection, would need to stop smoking for a least 2 weeks preop Assess willingness:  Not committed at this point Assist in quit attempt:  Per PCP when ready Arrange follow up:   Follow up per Primary Care planned      Total time devoted to counseling  > 50 % of initial 60 min office visit:  review case with pt/ discussion of options/alternatives/ personally creating written customized instructions  in presence of pt  then going over those specific  Instructions directly  with the pt including how to use all of the meds but in particular covering each new medication in detail and the difference between the maintenance= "automatic" meds and the prns using an action plan format for the latter (If this problem/symptom => do that organization reading Left to right).  Please see AVS from this visit for a full list of these instructions which I personally wrote for this pt and  are unique to this visit.    See device teaching which extended face to face time for this visit        Christinia Gully, MD 02/04/2018

## 2018-02-04 NOTE — Patient Instructions (Addendum)
The key is to stop smoking completely before smoking completely stops you!   Stop labetolol and start bisoprolol 5 mg daily  - if you find it's too strong take only one half of your amlodipine   Bevespi Take 2 puffs first thing in am and then another 2 puffs about 12 hours later.   Work on inhaler technique:  relax and gently blow all the way out then take a nice smooth deep breath back in, triggering the inhaler at same time you start breathing in.  Hold for up to 5 seconds if you can. Rinse and gargle with water when done  Please see patient coordinator before you leave today  to schedule PET scan   Please remember to go to the  x-ray department downstairs in the basement  for your tests - we will call you with the results when they are available.   Please schedule a follow up office visit in 2 weeks, sooner if needed with pfts on return  - did not go for cxr as rec       .

## 2018-02-05 ENCOUNTER — Encounter: Payer: Self-pay | Admitting: Internal Medicine

## 2018-02-05 NOTE — Assessment & Plan Note (Signed)
In the setting of respiratory symptoms of unknown etiology,  It would be preferable to use bystolic, the most beta -1  selective Beta blocker available in sample form, with bisoprolol the most selective generic choice  on the market, at least on a trial basis, to make sure the spillover Beta 2 effects of the less specific Beta blockers are not contributing to this patient's symptoms.    Rec:  Try bisoprolol 5 mg daily - if too strong break norvasc in half

## 2018-02-05 NOTE — Assessment & Plan Note (Addendum)
4-5 min discussion re active cigarette smoking in addition to office E&M  Ask about tobacco use:   ongoing Advise quitting   Especially given she may have lung ca and need resection, would need to stop smoking for a least 2 weeks preop Assess willingness:  Not committed at this point Assist in quit attempt:  Per PCP when ready Arrange follow up:   Follow up per Primary Care planned      Total time devoted to counseling  > 50 % of initial 60 min office visit:  review case with pt/ discussion of options/alternatives/ personally creating written customized instructions  in presence of pt  then going over those specific  Instructions directly with the pt including how to use all of the meds but in particular covering each new medication in detail and the difference between the maintenance= "automatic" meds and the prns using an action plan format for the latter (If this problem/symptom => do that organization reading Left to right).  Please see AVS from this visit for a full list of these instructions which I personally wrote for this pt and  are unique to this visit.    See device teaching which extended face to face time for this visit

## 2018-02-05 NOTE — Assessment & Plan Note (Signed)
CT chest 02/02/18  spn rll   21 x 28 spiculated  -  PET  High likelihood of ca but not at all likely this explains any of her symptoms which hopefully are nothing more than reaction to glucophage   W/u to start with pet and return for f/u in 2 weeks with full pfts to see if resectable/operable.   Discussed in detail all the  indications, usual  risks and alternatives  relative to the benefits with patient who agrees to proceed with w/u as outlined.

## 2018-02-05 NOTE — Assessment & Plan Note (Addendum)
Spirometry 02/04/2018  FEV1 1.0 (49%)  Ratio 73 with no prior rx  - 02/04/2018  After extensive coaching inhaler device,  effectiveness =    75% (short Ti) try bevespi 2bid   She does not meet criteria for copd based on today's study but is not a good candidate for RLL due to ? Restriction so rec   1) cxr/  along with walking sats on return 2) trial off normodyne to see if that makes any difference (see separate a/p)  3) bevespi 2bid trial (sample only ) x 2 weeks then full pfts  3) work on smoking cessation (see separate a/p)

## 2018-02-14 ENCOUNTER — Ambulatory Visit (HOSPITAL_COMMUNITY)
Admission: RE | Admit: 2018-02-14 | Discharge: 2018-02-14 | Disposition: A | Discharge status: Home / Self Care | Payer: BLUE CROSS/BLUE SHIELD | Source: Ambulatory Visit | Attending: Internal Medicine | Admitting: Internal Medicine

## 2018-02-14 DIAGNOSIS — R911 Solitary pulmonary nodule: Secondary | ICD-10-CM | POA: Insufficient documentation

## 2018-02-14 LAB — GLUCOSE, CAPILLARY: GLUCOSE-CAPILLARY: 120 mg/dL — AB (ref 70–99)

## 2018-02-14 MED ORDER — FLUDEOXYGLUCOSE F - 18 (FDG) INJECTION
7.6000 | Freq: Once | INTRAVENOUS | Status: AC | PRN
  Administered 2018-02-14: 7.6 via INTRAVENOUS

## 2018-02-16 ENCOUNTER — Telehealth: Payer: Self-pay | Admitting: Internal Medicine

## 2018-02-16 NOTE — Telephone Encounter (Signed)
Routing to Dr. Melvyn Novas as he was the one who had tried calling pt. Pt can be reached at [phone]

## 2018-02-16 NOTE — Telephone Encounter (Signed)
Advised re results and f/u as scheduled for ? Resection?

## 2018-02-16 NOTE — Telephone Encounter (Signed)
Pt is giving a new number to call [phone]

## 2018-02-18 ENCOUNTER — Other Ambulatory Visit: Payer: Self-pay | Admitting: *Deleted

## 2018-02-18 MED ORDER — CLOPIDOGREL BISULFATE 75 MG PO TABS: 75.0000 mg | ORAL_TABLET | Freq: Every day | ORAL | 0 refills | Status: DC

## 2018-03-07 ENCOUNTER — Ambulatory Visit (INDEPENDENT_AMBULATORY_CARE_PROVIDER_SITE_OTHER): Payer: BLUE CROSS/BLUE SHIELD | Admitting: Internal Medicine

## 2018-03-07 ENCOUNTER — Encounter: Payer: Self-pay | Admitting: Internal Medicine

## 2018-03-07 VITALS — BP 122/80 | HR 57 | Ht 60.25 in | Wt 158.0 lb

## 2018-03-07 DIAGNOSIS — F17210 Nicotine dependence, cigarettes, uncomplicated: Secondary | ICD-10-CM | POA: Diagnosis not present

## 2018-03-07 DIAGNOSIS — J449 Chronic obstructive pulmonary disease, unspecified: Secondary | ICD-10-CM

## 2018-03-07 DIAGNOSIS — R911 Solitary pulmonary nodule: Secondary | ICD-10-CM | POA: Diagnosis not present

## 2018-03-07 DIAGNOSIS — I2510 Atherosclerotic heart disease of native coronary artery without angina pectoris: Secondary | ICD-10-CM

## 2018-03-07 DIAGNOSIS — I10 Essential (primary) hypertension: Secondary | ICD-10-CM | POA: Diagnosis not present

## 2018-03-07 LAB — PULMONARY FUNCTION TEST
DL/VA % pred: 79 %
DL/VA: 3.38 ml/min/mmHg/L
DLCO UNC: 9.78 ml/min/mmHg
DLCO unc % pred: 50 %
FEF 25-75 Post: 1.33 L/sec
FEF 25-75 Pre: 0.69 L/sec
FEF2575-%Change-Post: 93 %
FEF2575-%Pred-Post: 72 %
FEF2575-%Pred-Pre: 37 %
FEV1-%Change-Post: 16 %
FEV1-%PRED-PRE: 54 %
FEV1-%Pred-Post: 64 %
FEV1-POST: 1.29 L
FEV1-PRE: 1.1 L
FEV1FVC-%Change-Post: 3 %
FEV1FVC-%Pred-Pre: 92 %
FEV6-%Change-Post: 11 %
FEV6-%PRED-POST: 68 %
FEV6-%Pred-Pre: 61 %
FEV6-POST: 1.74 L
FEV6-PRE: 1.56 L
FEV6FVC-%CHANGE-POST: 0 %
FEV6FVC-%PRED-PRE: 104 %
FEV6FVC-%Pred-Post: 104 %
FVC-%CHANGE-POST: 12 %
FVC-%PRED-PRE: 58 %
FVC-%Pred-Post: 66 %
FVC-POST: 1.76 L
FVC-Pre: 1.56 L
POST FEV1/FVC RATIO: 73 %
PRE FEV6/FVC RATIO: 100 %
Post FEV6/FVC ratio: 100 %
Pre FEV1/FVC ratio: 71 %
RV % PRED: 100 %
RV: 1.96 L
TLC % pred: 88 %
TLC: 3.99 L

## 2018-03-07 NOTE — Patient Instructions (Addendum)
The key is to stop smoking completely before smoking completely stops you!    Please see patient coordinator before you leave today  to schedule T surgery Dr Emilie Rutter.

## 2018-03-07 NOTE — Progress Notes (Signed)
PFT completed today.  

## 2018-03-07 NOTE — Progress Notes (Signed)
Maria Arroyo, female    DOB: 20-Apr-1950,    MRN: [id number]    Brief patient profile:  62 yowf active smoker with new symptom of anorexia summer of 2019 assoc with 25 lb wt loss ?  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 Pulmonary Consult    Referred by Dr. Melford Aase for eval of lung mass found incidentally on ct abd done at [facility] 02/02/18.  She has had some loss of appetite lately.  She has had a cough over the past wk- non prod.   Dyspnea:  MMRC2 = can't walk a nl pace on a flat grade s sob but does fine slow and flat eg leaning on cart (due to chronic pain) Cough: min/ non-productive Sleep: R side / flat bed/ 2 pillows SABA use: does not use rec The key is to stop smoking completely before smoking completely stops you!  Stop labetolol and start bisoprolol 5 mg daily  - if you find it's too strong take only one half of your amlodipine  Bevespi Take 2 puffs first thing in am and then another 2 puffs about 12 hours later.  Work on inhaler technique:  relax and gently blow all the way out then take a nice smooth deep breath back in, triggering the inhaler at same time you start breathing in.  Hold for up to 5 seconds if you can. Rinse and gargle with water when done Please see patient coordinator before you leave today  to schedule PET scan   03/07/2018  f/u ov/Tashawn Laswell re: copd GOLD II with  some reversibility/ could not tell change on Bevespi with sob   Chief Complaint  Patient presents with  . Follow-up    PFT's done today. Breathing is improved since last OV since starting Bevespi.  She rarely uses her albuterol inhaler.    Dyspnea:  MMRC2 = can't walk a nl pace on a flat grade s sob but does fine slow and flat    Cough: none Sleeping: bed is flat, 2  pillows   No obvious day to day or daytime variability or assoc excess/ purulent sputum or mucus plugs or hemoptysis or cp or chest tightness, subjective wheeze or overt sinus or hb symptoms.   Sleeping as abov e without nocturnal  or early am exacerbation  of respiratory  c/o's or need for noct saba. Also denies any obvious fluctuation of symptoms with weather or environmental changes or other aggravating or alleviating factors except as outlined above   No unusual exposure hx or h/o childhood pna/ asthma or knowledge of premature birth.  Current Allergies, Complete Past Medical History, Past Surgical History, Family History, and Social History were reviewed in [employer] record.  ROS  The following are not active complaints unless bolded Hoarseness, sore throat, dysphagia, dental problems, itching, sneezing,  nasal congestion or discharge of excess mucus or purulent secretions, ear ache,   fever, chills, sweats, unintended wt loss or wt gain, classically pleuritic or exertional cp,  orthopnea pnd or arm/hand swelling  or leg swelling, presyncope, palpitations, abdominal pain, anorexia, nausea, vomiting, diarrhea  or change in bowel habits or change in bladder habits, change in stools or change in urine, dysuria, hematuria,  rash, arthralgias, visual  complaints, headache, numbness, weakness or ataxia or problems with walking or coordination,  change in mood or  memory.        Current Meds  Medication Sig  . allopurinol (ZYLOPRIM) 100 MG tablet Take 100 mg by mouth daily.  [name] amLODipine (NORVASC) 10 MG tablet Take 10 mg by mouth daily.   [name] aspirin 81 MG tablet Take 81 mg by mouth daily.  . bisoprolol (ZEBETA) 5 MG tablet Take 1 tablet (5 mg total) by mouth daily.  . Calcium Carbonate-Vitamin D (CALCIUM 600 + D PO) Take 1 tablet by mouth daily.   . carisoprodol (SOMA) 350 MG tablet Take 350 mg by mouth 4 (four) times daily as needed. For muscle spasms  . Cholecalciferol  (VITAMIN D) 2000 UNITS tablet Take 4,000 Units by mouth daily.   . clopidogrel (PLAVIX) 75 MG tablet Take 1 tablet (75 mg total) by mouth daily. NEED OV.  [name] gabapentin (NEURONTIN) 600 MG tablet Take 1 tablet (600 mg total) by mouth 4 (four) times daily. At 8am 1pm 6pm and 10pm  . losartan (COZAAR) 50 MG tablet Take 50 mg by mouth daily.   . Multiple Vitamin (MULTIVITAMIN) tablet Take 1 tablet by mouth daily.   . nitroGLYCERIN (NITROSTAT) 0.4 MG SL tablet Place 1 tablet (0.4 mg total) under the tongue every 5 (five) minutes as needed. For chest pain  . nystatin cream (MYCOSTATIN) APP EXT AA BID prn  . oxyCODONE-acetaminophen (PERCOCET) 10-325 MG tablet TK 1 T PO QID prn  . PROVENTIL HFA 108 (90 Base) MCG/ACT inhaler Inhale 1-2 puffs into the lungs. Prn  . rosuvastatin (CRESTOR) 40 MG tablet Take 1 tablet (40 mg total) by mouth daily. NEED OV.  . torsemide (DEMADEX) 20 MG tablet Take 20 mg by mouth daily.    [name] venlafaxine XR (EFFEXOR-XR) 75 MG 24 hr capsule TK 1 C PO BID  .                        Objective:     Somber amb wf  nad     Wt Readings from Last 3 Encounters:  03/07/18 158 lb (71.7 kg)  02/04/18 157 lb (71.2 kg)  03/18/17 185 lb 9.6 oz (84.2 kg)     Vital signs reviewed - Note on arrival 02 sats  98% on RA and bp 122/80   HEENT: Full dentures , nl  turbinates bilaterally, and oropharynx. Nl external ear canals without cough reflex Modified Mallampati Score =   1   NECK :  without JVD/Nodes/TM/ nl carotid upstrokes bilaterally   LUNGS: no acc muscle use,  Nl contour chest with slightly distant bs  bilaterally without cough on insp or exp maneuvers   CV:  RRR  no s3 or murmur or increase in P2, and no edema   ABD:  Obese soft and nontender with nl inspiratory excursion in the supine position. No bruits or organomegaly appreciated, bowel sounds nl  MS:  Nl gait/ ext warm without deformities, calf tenderness, cyanosis or clubbing No obvious joint restrictions     SKIN: warm and dry without lesions    NEURO:  alert, approp, nl sensorium with  no motor or cerebellar deficits apparent.             Assessment

## 2018-03-08 ENCOUNTER — Encounter: Payer: Self-pay | Admitting: Internal Medicine

## 2018-03-08 NOTE — Assessment & Plan Note (Signed)
Changed normodyne to bisoprolol 02/04/2018 due to ? Copd > no change symptoms   Although she can't tell any difference, bp well controlled off normodyne and on bisoprolol and In the setting of documented reversible airflow obst  It would be preferable to continue bisoprolol the most selective generic choice  on the market,   to make sure the spillover Beta 2 effects of the less specific Beta blockers are not contributing to this patient's symptoms.   >> rec bisoprolol 5 mg daily

## 2018-03-08 NOTE — Assessment & Plan Note (Addendum)
Spirometry 02/04/2018  FEV1 1.0 (49%)  Ratio 73 with no prior rx  - 02/04/2018  After extensive coaching inhaler device,  effectiveness =    75% (short Ti) try bevespi 2bid > no improvement doe so stopped  - PFT's  03/07/2018  FEV1 1.29 (54 % ) ratio 73 (FEV1/VC = 64%)   p 16 % improvement from saba p nothing prior to study with DLCO  50 % corrects to 79 % for alv volume and typical curvature   - 03/07/2018   Walked RA  2 laps @ 247ft each @ nl pace  stopped due to end of study, no sob and no desats   No better with lama/laba so no need for maint rx at this point

## 2018-03-08 NOTE — Assessment & Plan Note (Signed)
CT chest 02/02/18  spn rll   21 x 28 spiculated  -  PET  02/14/2018 >    1. Hypermetabolic right lower lobe pulmonary nodule, consistent with primary bronchogenic carcinoma. Presuming non-small-cell histology, T1cN0M0 or stage IA. 2. Left inguinal hypermetabolic lymph node, favored to be reactive. 3. Coronary artery atherosclerosis. Aortic Atherosclerosis (ICD10-I70.0). 4. Pulmonary artery enlargement suggests pulmonary arterial hypertension. 5. Healing posttraumatic lower right rib fractures.  She needs echo prior to surgery but even if nl doubt she could tolerate RLLobectomy so options are excisional bx/ seeds or navigational bx/ Ext RT but will defer this to Dr Hendrickson's capable hands  Discussed in detail all the  indications, usual  risks and alternatives  relative to the benefits with patient who agrees to proceed with w/u as outlined.

## 2018-03-08 NOTE — Assessment & Plan Note (Addendum)
4-5 min discussion re active cigarette smoking in addition to office E&M  Ask about tobacco use:   ongoing Advise quitting   I reviewed the Fletcher curve with the patient that basically indicates  if you quit smoking when your best day FEV1 is still   preserved (as is still relativley the case here)  it is highly unlikely you will progress to severe disease and informed the patient there was  no medication on the market that has proven to alter the curve/ its downward trajectory  or the likelihood of progression of their disease(unlike other chronic medical conditions such as atheroclerosis where we do think we can change the natural hx with risk reducing meds).  Therefore stopping smoking and maintaining abstinence are  the most important aspects of her care, not choice of inhalers or for that matter, doctors. Treatment other than smoking cessation  is entirely directed by severity of symptoms and focused also on reducing exacerbations, not attempting to change the natural history of the disease.    Since not exac and no better on lama/laba, rx for copd can be prn with saba/sama  Assess willingness:  Not committed at this point Assist in quit attempt:  Per PCP when ready Arrange follow up:   Follow up per Primary Care planned     I had an extended discussion with the patient and husband reviewing all relevant studies completed to date and  lasting 15 to 20 minutes of a 25 minute visit    Each maintenance medication was reviewed in detail including most importantly the difference between maintenance and prns and under what circumstances the prns are to be triggered using an action plan format that is not reflected in the computer generated alphabetically organized AVS.     Please see AVS for specific instructions unique to this visit that I personally wrote and verbalized to the the pt in detail and then reviewed with pt  by my nurse highlighting any  changes in therapy recommended at today's visit  to their plan of care.

## 2018-03-16 ENCOUNTER — Telehealth: Payer: Self-pay | Admitting: *Deleted

## 2018-03-16 NOTE — Progress Notes (Signed)
[facility]  PATIENT: Maria Arroyo DOB: Aug 02, 1950   REASON FOR VISIT: Follow-up for disturbance in skin, abnormality of gait, peripheral neuropathy HISTORY FROM: Patient    HISTORY OF PRESENT ILLNESS:Maria Arroyo, 67 year old female returns for yearly followup.  She continues to have complaints of hypersensitivity of the skin arms greater than legs and some muscle cramping involving the arms and legs.  She remains on gabapentin 600 (4) times daily. She also has fibromyalgia. She is on Soma 4 times a day for cramping. Her neurologic symptoms have not changed. She gets no regular exercise, she has not had any falls.Also has  gout.  She continues to smoke and was encouraged to stop .  She has an appointment this afternoon with a thoracic surgeon for a"spot on my line".  She returns for reevaluation and refills   HISTORY: of a cervical myelopathy, status post decompressive surgery in November 2012. MRI evaluation of the cervical spine done prior to surgery shows evidence of cord edema at the C4-5 level. MRI of the brain was also done, and this was unremarkable. The patient has continued to have complaints of hypersensitivity of the skin of the arms greater than the legs. The patient has crawling sensations throughout the body, including the left lower back. The patient has some fecal incontinence, but she indicates that the bladder is working fairly well. The patient has some mild gait instability. The patient reports a pressure sensation in the eyes, and crawling sensations along the head. The patient denies any increased symptoms with neck flexion or extension. The patient reports that she has muscle cramps involving the arms and legs. The patient is on Vytorin, but she says that she has been on this medication for several years and never had problems with muscle cramps. Niacin was recently added and complains of flushing. The patient indicates that she does not sleep well at  times.    REVIEW OF SYSTEMS: Full 14 system review of systems performed and notable only for those listed, all others are neg:  Constitutional: neg  Cardiovascular: neg Ear/Nose/Throat: neg  Skin: neg Eyes: neg Respiratory: neg Gastroitestinal: neg  Hematology/Lymphatic: neg  Endocrine: neg Musculoskeletal:joint pain,  walking difficulty Allergy/Immunology: neg Neurological: Disturbance of the skin Psychiatric: neg Sleep : neg   ALLERGIES: Allergies  Allergen Reactions  . Ace Inhibitors Other (See Comments)    Worsening renal function   . Codeine   . Codeine-Guaifenesin [Guaifenesin-Codeine]     Some nausea--but ok with percocet    HOME MEDICATIONS: Outpatient Medications Prior to Visit  Medication Sig Dispense Refill  . allopurinol (ZYLOPRIM) 100 MG tablet Take 100 mg by mouth daily.    [name] amLODipine (NORVASC) 10 MG tablet Take 10 mg by mouth daily.     [name] aspirin 81 MG tablet Take 81 mg by mouth daily.    . bisoprolol (ZEBETA) 5 MG tablet Take 1 tablet (5 mg total) by mouth daily. 30 tablet 11  . Calcium Carbonate-Vitamin D (CALCIUM 600 + D PO) Take 1 tablet by mouth daily.     . carisoprodol (SOMA) 350 MG tablet Take 350 mg by mouth 4 (four) times daily as needed. For muscle spasms    . Cholecalciferol (VITAMIN D) 2000 UNITS tablet Take 4,000 Units by mouth daily.     . clopidogrel (PLAVIX) 75 MG tablet Take 1 tablet (75 mg total) by mouth daily. NEED OV. 90 tablet 0  . gabapentin (NEURONTIN) 600 MG tablet Take 1 tablet (600 mg total)  by mouth 4 (four) times daily. At 8am 1pm 6pm and 10pm 120 tablet 11  . losartan (COZAAR) 50 MG tablet Take 50 mg by mouth daily.   4  . Multiple Vitamin (MULTIVITAMIN) tablet Take 1 tablet by mouth daily.     . nitroGLYCERIN (NITROSTAT) 0.4 MG SL tablet Place 1 tablet (0.4 mg total) under the tongue every 5 (five) minutes as needed. For chest pain 100 tablet 0  . nystatin cream (MYCOSTATIN) APP EXT AA BID prn  1  .  oxyCODONE-acetaminophen (PERCOCET) 10-325 MG tablet TK 1 T PO QID prn  0  . PROVENTIL HFA 108 (90 Base) MCG/ACT inhaler Inhale 1-2 puffs into the lungs. Prn  4  . rosuvastatin (CRESTOR) 40 MG tablet Take 1 tablet (40 mg total) by mouth daily. NEED OV. 90 tablet 0  . torsemide (DEMADEX) 20 MG tablet Take 20 mg by mouth daily.      [name] venlafaxine XR (EFFEXOR-XR) 75 MG 24 hr capsule TK 1 C PO BID  0   No facility-administered medications prior to visit.     PAST MEDICAL HISTORY: Past Medical History:  Diagnosis Date  . Cervical disc disease   . COPD (chronic obstructive pulmonary disease) ([place])   . Coronary artery disease   . Depression   . Gout    R foot and knee  . Hyperlipidemia   . Hypertension   . Neuropathy   . OA (osteoarthritis)   . Tobacco abuse     PAST SURGICAL HISTORY: Past Surgical History:  Procedure Laterality Date  . ABDOMINAL HYSTERECTOMY    . ANTERIOR CERVICAL DECOMP/DISCECTOMY FUSION  02/06/2011   Procedure: ANTERIOR CERVICAL DECOMPRESSION/DISCECTOMY FUSION 2 LEVELS;  Surgeon: Winfield Cunas;  Location: [facility];  Service: Neurosurgery;  Laterality: N/A;  Anterior Cervical Four-Five/Five-Six Decompression and Fusion with Plating and Bonegraft  . BLADDER SURGERY    . CARDIAC CATHETERIZATION  04/09/2009   EF 60%  . CARDIAC CATHETERIZATION  03/24/2001   EF 55%  . CARDIAC CATHETERIZATION  01/06/2001   EF 65%  . CHOLECYSTECTOMY    . CORONARY STENT PLACEMENT  03/2009   STENTING OF THE PROXIMAL TO MID RIGHT CORONARY  . LEFT HEART CATHETERIZATION WITH CORONARY ANGIOGRAM N/A 12/21/2011   Procedure: LEFT HEART CATHETERIZATION WITH CORONARY ANGIOGRAM;  Surgeon: Peter M Martinique, MD;  Location: [facility] C[facility];  Service: Cardiovascular;  Laterality: N/A;  . OTHER SURGICAL HISTORY  12/2014   R foot cyst removal    FAMILY HISTORY: Family History  Problem Relation Age of Onset  . Aneurysm Mother   . Heart attack Father   . Heart failure Father   . Stroke Sister   .  Heart attack Brother     SOCIAL HISTORY: Social History   Socioeconomic History  . Marital status: Married    Spouse name: Not on file  . Number of children: 2  . Years of education: 10  . Highest education level: Not on file  Occupational History  . Occupation: housewife    Employer: UNEMPLOYED  Social Needs  . Financial resource strain: Not on file  . Food insecurity:    Worry: Not on file    Inability: Not on file  . Transportation needs:    Medical: Not on file    Non-medical: Not on file  Tobacco Use  . Smoking status: Current Every Day Smoker    Packs/day: 2.00    Years: 35.00    Pack years: 70.00    Types:  Cigarettes  . Smokeless tobacco: Never Used  . Tobacco comment: 1-1.5 ppd (10/02/13), 03/17/18 2 PPD  Substance and Sexual Activity  . Alcohol use: No    Alcohol/week: 0.0 standard drinks  . Drug use: No  . Sexual activity: Not on file  Lifestyle  . Physical activity:    Days per week: Not on file    Minutes per session: Not on file  . Stress: Not on file  Relationships  . Social connections:    Talks on phone: Not on file    Gets together: Not on file    Attends religious service: Not on file    Active member of club or organization: Not on file    Attends meetings of clubs or organizations: Not on file    Relationship status: Not on file  . Intimate partner violence:    Fear of current or ex partner: Not on file    Emotionally abused: Not on file    Physically abused: Not on file    Forced sexual activity: Not on file  Other Topics Concern  . 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   The patient has a 10th grade education.   The patient is left-handed.   The patient has two children.   Patient drinks three cups of soda daily.         PHYSICAL EXAM  Vitals:   03/17/18 0847  BP: 118/71  Pulse: (!) 51  Weight: 158 lb 12.8 oz (72 kg)  Height: 5' 0.5" (1.537 m)   Body mass index  is 30.5 kg/m. Generalized: Well developed, moderately obese female in no acute distress  Neck: Supple,with full ROM  Skin no peripheral edema  Neurological examination   Mentation: Alert oriented to time, place, history taking. Follows all commands speech and language fluent  Cranial nerve II-XII: Pupils were equal round reactive to light extraocular movements were full, visual field were full on confrontational test. Facial sensation and strength were normal. hearing was intact to finger rubbing bilaterally. Uvula tongue midline. head turning and shoulder shrug were normal and symmetric.Tongue protrusion into cheek strength was normal. Motor: normal bulk and tone, full strength in the BUE, BLE,  Sensory: normal and symmetric to light touch, decreased pinprick to midshin , vibration in upper and lower extremities normal Coordination: finger-nose-finger, heel-to-shin bilaterally, no dysmetria Reflexes: Symmetric upper and lower, plantar responses were flexor bilaterally. Gait and Station: Rising up from seated position without assistance, normal stance, moderate stride, good arm swing, smooth turning, able to perform tiptoe, and heel walking without difficulty. Tandem gait is unsteady. No assistive device   DIAGNOSTIC DATA (LABS, IMAGING, TESTING) -  ASSESSMENT AND PLAN 67 y.o. year old female has a past medical history of Coronary artery disease; COPD (chronic obstructive pulmonary disease); Hypertension; Hyperlipidemia; Tobacco abuse; OA (osteoarthritis); Depression; Neuropathy; and Cervical disc disease here to follow-up  PLAN: Continue gabapentin at current dose will refill for 1 year Needs to  walk daily for overall general health Stay well-hydrated by drinking water Stop smoking which can damage the nerves and vessels Follow-up yearly and when necessary Dennie Bible, [facility], [facility], APRN [facility] [address], [place], [place] [address] [phone]

## 2018-03-16 NOTE — Telephone Encounter (Signed)
Noted.  Change made.

## 2018-03-16 NOTE — Telephone Encounter (Signed)
LMVM for pt to return call.  Having to reschedule appt from Friday 03/18/18 to (if ok with her to Thursday 03-17-18 at [facility], arrive 0815 due to CM/NP schedule change (going to funeral).  If she calls you can take care of this or if time is not good, can make appt which may be next yr, with CM, or [facility] new NP Sarah.

## 2018-03-16 NOTE — Telephone Encounter (Signed)
Pt returning RNs call, accepted appt for tomorrow at 8:45.

## 2018-03-17 ENCOUNTER — Institutional Professional Consult (permissible substitution) (INDEPENDENT_AMBULATORY_CARE_PROVIDER_SITE_OTHER): Payer: BLUE CROSS/BLUE SHIELD | Admitting: Thoracic Surgery (Cardiothoracic Vascular Surgery)

## 2018-03-17 ENCOUNTER — Encounter: Payer: Self-pay | Admitting: Nurse Practitioner

## 2018-03-17 ENCOUNTER — Other Ambulatory Visit: Payer: Self-pay

## 2018-03-17 ENCOUNTER — Ambulatory Visit (INDEPENDENT_AMBULATORY_CARE_PROVIDER_SITE_OTHER): Payer: BLUE CROSS/BLUE SHIELD | Admitting: Nurse Practitioner

## 2018-03-17 ENCOUNTER — Encounter: Payer: Self-pay | Admitting: Thoracic Surgery (Cardiothoracic Vascular Surgery)

## 2018-03-17 VITALS — BP 110/70 | HR 56 | Resp 18 | Ht 60.5 in | Wt 158.8 lb

## 2018-03-17 VITALS — BP 118/71 | HR 51 | Ht 60.5 in | Wt 158.8 lb

## 2018-03-17 DIAGNOSIS — G629 Polyneuropathy, unspecified: Secondary | ICD-10-CM | POA: Diagnosis not present

## 2018-03-17 DIAGNOSIS — R209 Unspecified disturbances of skin sensation: Secondary | ICD-10-CM | POA: Diagnosis not present

## 2018-03-17 DIAGNOSIS — R269 Unspecified abnormalities of gait and mobility: Secondary | ICD-10-CM

## 2018-03-17 DIAGNOSIS — I2510 Atherosclerotic heart disease of native coronary artery without angina pectoris: Secondary | ICD-10-CM

## 2018-03-17 DIAGNOSIS — R911 Solitary pulmonary nodule: Secondary | ICD-10-CM

## 2018-03-17 MED ORDER — GABAPENTIN 600 MG PO TABS: 600.0000 mg | ORAL_TABLET | Freq: Four times a day (QID) | ORAL | 11 refills | Status: DC

## 2018-03-17 NOTE — H&P (View-Only) (Signed)
PCP is [name], MD Referring Provider is Tanda Rockers, MD  Chief Complaint  Patient presents with  . Lung Lesion    new patient consultation, PET 02/14/18, PFTs 03/07/18    HPI: Maria Arroyo sent for consultation regarding a right lower lobe lung mass.  Maria Arroyo is a 67 year old woman with a long history of tobacco abuse (2 packs/day for greater than 35 years), COPD, coronary artery disease, four previous coronary stents, hypertension, hyperlipidemia, osteoarthritis, cervical disc disease, chronic back pain, neuropathy, gout, and depression.  She recently saw Dr. Melford Aase.  She had been feeling poorly.  She mentioned that she had a 25 pound weight loss over 3 months.  She complained of early satiety and also occasional nausea.  He did a work-up for an occult malignancy including a chest x-ray which showed a question of a right lower lobe lung nodule.  She was referred to Dr. Melvyn Novas.  He recommended a PET CT.  That showed the mass was markedly hypermetabolic with an SUV of 11.  She had some healing rib fractures on the right side from a fall a few months prior.  There was no evidence of regional or distant metastases.  She says that her weight has stabilized over the past few weeks.  Her appetite is gotten a little better.  She thinks 1 of her medications may have caused some of her issues in that regard.  Her activities are limited primarily by back pain.  She walks on level ground without any significant shortness of breath.  She does get short of breath walking up an incline.  She denies cough or hemoptysis.  She has not had any unusual headaches or visual changes.  Zubrod Score: At the time of surgery this patient's most appropriate activity status/level should be described as: []     0    Normal activity, no symptoms [x]     1    Restricted in physical strenuous activity but ambulatory, able to do out light work []     2    Ambulatory and capable of self care, unable to do work  activities, up and about >50 % of waking hours                              []     3    Only limited self care, in bed greater than 50% of waking hours []     4    Completely disabled, no self care, confined to bed or chair []     5    Moribund  Past Medical History:  Diagnosis Date  . Cervical disc disease   . COPD (chronic obstructive pulmonary disease) ([place])   . Coronary artery disease   . Depression   . Gout    R foot and knee  . Hyperlipidemia   . Hypertension   . Neuropathy   . OA (osteoarthritis)   . Tobacco abuse     Past Surgical History:  Procedure Laterality Date  . ABDOMINAL HYSTERECTOMY    . ANTERIOR CERVICAL DECOMP/DISCECTOMY FUSION  02/06/2011   Procedure: ANTERIOR CERVICAL DECOMPRESSION/DISCECTOMY FUSION 2 LEVELS;  Surgeon: Winfield Cunas;  Location: [facility];  Service: Neurosurgery;  Laterality: N/A;  Anterior Cervical Four-Five/Five-Six Decompression and Fusion with Plating and Bonegraft  . BLADDER SURGERY    . CARDIAC CATHETERIZATION  04/09/2009   EF 60%  . CARDIAC CATHETERIZATION  03/24/2001   EF 55%  .  CARDIAC CATHETERIZATION  01/06/2001   EF 65%  . CHOLECYSTECTOMY    . CORONARY STENT PLACEMENT  03/2009   STENTING OF THE PROXIMAL TO MID RIGHT CORONARY  . LEFT HEART CATHETERIZATION WITH CORONARY ANGIOGRAM N/A 12/21/2011   Procedure: LEFT HEART CATHETERIZATION WITH CORONARY ANGIOGRAM;  Surgeon: Peter M Martinique, MD;  Location: [facility] CATH LAB;  Service: Cardiovascular;  Laterality: N/A;  . OTHER SURGICAL HISTORY  12/2014   R foot cyst removal    Family History  Problem Relation Age of Onset  . Aneurysm Mother   . Heart attack Father   . Heart failure Father   . Stroke Sister   . Heart attack Brother     Social History Social History   Tobacco Use  . Smoking status: Current Every Day Smoker    Packs/day: 2.00    Years: 35.00    Pack years: 70.00    Types: Cigarettes  . Smokeless tobacco: Never Used  . Tobacco comment: 1-1.5 ppd (10/02/13), 03/17/18 2  PPD  Substance Use Topics  . Alcohol use: No    Alcohol/week: 0.0 standard drinks  . Drug use: No    Current Outpatient Medications  Medication Sig Dispense Refill  . allopurinol (ZYLOPRIM) 100 MG tablet Take 100 mg by mouth daily.    [name] amLODipine (NORVASC) 10 MG tablet Take 10 mg by mouth daily.     [name] aspirin 81 MG tablet Take 81 mg by mouth daily.    . bisoprolol (ZEBETA) 5 MG tablet Take 1 tablet (5 mg total) by mouth daily. 30 tablet 11  . Calcium Carbonate-Vitamin D (CALCIUM 600 + D PO) Take 1 tablet by mouth daily.     . carisoprodol (SOMA) 350 MG tablet Take 350 mg by mouth 4 (four) times daily as needed. For muscle spasms    . Cholecalciferol (VITAMIN D) 2000 UNITS tablet Take 4,000 Units by mouth daily.     . clopidogrel (PLAVIX) 75 MG tablet Take 1 tablet (75 mg total) by mouth daily. NEED OV. 90 tablet 0  . gabapentin (NEURONTIN) 600 MG tablet Take 1 tablet (600 mg total) by mouth 4 (four) times daily. At 8am 1pm 6pm and 10pm 120 tablet 11  . losartan (COZAAR) 50 MG tablet Take 50 mg by mouth daily.   4  . Multiple Vitamin (MULTIVITAMIN) tablet Take 1 tablet by mouth daily.     . nitroGLYCERIN (NITROSTAT) 0.4 MG SL tablet Place 1 tablet (0.4 mg total) under the tongue every 5 (five) minutes as needed. For chest pain 100 tablet 0  . nystatin cream (MYCOSTATIN) APP EXT AA BID prn  1  . oxyCODONE-acetaminophen (PERCOCET) 10-325 MG tablet TK 1 T PO QID prn  0  . PROVENTIL HFA 108 (90 Base) MCG/ACT inhaler Inhale 1-2 puffs into the lungs. Prn  4  . rosuvastatin (CRESTOR) 40 MG tablet Take 1 tablet (40 mg total) by mouth daily. NEED OV. 90 tablet 0  . torsemide (DEMADEX) 20 MG tablet Take 20 mg by mouth daily.      [name] venlafaxine XR (EFFEXOR-XR) 75 MG 24 hr capsule TK 1 C PO BID  0   No current facility-administered medications for this visit.     Allergies  Allergen Reactions  . Ace Inhibitors Other (See Comments)    Worsening renal function   . Codeine   .  Codeine-Guaifenesin [Guaifenesin-Codeine]     Some nausea--but ok with percocet    Review of Systems  Constitutional: Positive for appetite change  and unexpected weight change (Lost 25 pounds in 3 months).  HENT: Negative for trouble swallowing and voice change.   Eyes: Negative for visual disturbance.  Respiratory: Positive for shortness of breath. Negative for cough and wheezing.   Cardiovascular: Negative for chest pain and leg swelling.  Gastrointestinal: Positive for nausea. Negative for abdominal distention and abdominal pain.  Genitourinary: Negative for difficulty urinating and dysuria.  Musculoskeletal: Positive for arthralgias, back pain and neck pain.       Rib pain after fall 3 months ago  Neurological: Positive for numbness. Negative for seizures, weakness and headaches.  Hematological: Negative for adenopathy. Bruises/bleeds easily.  All other systems reviewed and are negative.   BP 110/70 (BP Location: Left Arm, Patient Position: Sitting, Cuff Size: Normal)   Pulse (!) 56   Resp 18   Ht 5' 0.5" (1.537 m)   Wt 158 lb 12.8 oz (72 kg)   SpO2 96% Comment: RA  BMI 30.50 kg/m  Physical Exam Vitals signs reviewed.  Constitutional:      General: She is not in acute distress.    Appearance: She is obese.  HENT:     Head: Normocephalic and atraumatic.  Eyes:     General: No scleral icterus.    Extraocular Movements: Extraocular movements intact.     Pupils: Pupils are equal, round, and reactive to light.  Neck:     Musculoskeletal: Neck supple.  Cardiovascular:     Rate and Rhythm: Normal rate and regular rhythm.     Heart sounds: Normal heart sounds. No murmur.  Pulmonary:     Effort: Pulmonary effort is normal.     Breath sounds: No wheezing or rales.     Comments: Diminished breath sounds bilaterally Musculoskeletal:        General: No swelling.  Lymphadenopathy:     Cervical: No cervical adenopathy.  Skin:    General: Skin is warm and dry.  Neurological:      General: No focal deficit present.     Mental Status: She is alert and oriented to person, place, and time.     Cranial Nerves: No cranial nerve deficit.     Motor: No weakness.     Coordination: Coordination normal.  Psychiatric:     Comments: Flat affect    Diagnostic Tests: NUCLEAR MEDICINE PET SKULL BASE TO THIGH  TECHNIQUE: 7.6 mCi F-18 FDG was injected intravenously. Full-ring PET imaging was performed from the skull base to thigh after the radiotracer. CT data was obtained and used for attenuation correction and anatomic localization.  Fasting blood glucose: 120 mg/dl  COMPARISON:  Chest radiograph 02/05/2011. Abdominopelvic CT of 09/13/2009.  FINDINGS: Mediastinal blood pool activity: SUV max 2.4  NECK: No areas of abnormal hypermetabolism.  Incidental CT findings: No cervical adenopathy. Fatty atrophy involving the right parotid gland. Bilateral carotid atherosclerosis.  CHEST: No thoracic nodal hypermetabolism. Spiculated right lower lobe pulmonary nodule measures 2.5 x 2.2 cm and a S.U.V. max of 11.2 on image 45/8. Contiguous or adjacent satellite nodule of 9 mm on image 47/8, immediately inferiorly.  Incidental CT findings: Aortic atherosclerosis. Multivessel coronary artery atherosclerosis. Pulmonary artery enlargement, outflow tract 3.9 cm. Moderate centrilobular emphysema.  ABDOMEN/PELVIS: Left inguinal node measures 11 mm and a S.U.V. max of 4.0 on image 167/4. This maintains its fatty hilum.  No abdominopelvic nodal or parenchymal hypermetabolism otherwise.  Incidental CT findings: Adrenal thickening and right-sided low-density nodularity, most consistent with an adenoma. Cholecystectomy. Left renal atrophy. Upper pole low-density left renal  lesion is likely a cyst. Punctate right renal collecting system calculi. Nonaneurysmal infrarenal aortic dilatation at 2.6 cm. Hysterectomy. Pelvic floor laxity.  SKELETON: Hypermetabolism  corresponding to right anterolateral seventh through ninth healing rib fractures. No suspicious osseous hypermetabolism.  Incidental CT findings: Osteopenia.  Cervical spine fixation.  IMPRESSION: 1. Hypermetabolic right lower lobe pulmonary nodule, consistent with primary bronchogenic carcinoma. Presuming non-small-cell histology, T1cN0M0 or stage IA. 2. Left inguinal hypermetabolic lymph node, favored to be reactive. 3. Coronary artery atherosclerosis. Aortic Atherosclerosis (ICD10-I70.0). 4. Pulmonary artery enlargement suggests pulmonary arterial hypertension. 5. Healing posttraumatic lower right rib fractures.   Electronically Signed   By: Abigail Miyamoto M.D.   On: 02/14/2018 14:41 I personally reviewed the PET images and concur with the findings noted above.  If there is truly a satellite nodule this would be a T3, N0, stage IIb  Pulmonary function testing FVC 1.56 (58%) FEV1 1.10 (54%) FEV1/FVC 0.71 FEV1 1.29 (64%) postbronchodilator DLCO 9.78 (50%)  Impression: Maria Arroyo is a 67 year old woman with a history of heavy tobacco abuse, COPD, coronary artery disease, stents, hypertension, hyperlipidemia, depression, chronic back and neck pain, neuropathy, and gout.  She recently presented with a 25 pound weight loss over 3 months and general malaise.  Work-up has revealed a 2.5 cm mass in the right lower lobe that is markedly hypermetabolic on PET/CT.  There is a small 8 mm nodule adjacent to the primary lesion consistent with a satellite lesion.  This would either be T1N0 or more likely T3N0 stage IIb non-small cell lung cancer.  I reviewed the PET images with Maria Arroyo and her family.  They understand this almost certainly is a lung cancer.  Infectious and inflammatory nodules are also in the differential but are far less likely and we have to treat this as lung cancer unless we can prove otherwise.  She has a history of coronary disease and has had prior stents  placed.  She is on Plavix.  She would need to hold that for 5 days prior to surgery.  She is not having any anginal symptoms currently.  I will check with Dr. Martinique, but I do not think there is any need for an extensive cardiac work-up prior to surgery.  She does have adequate pulmonary reserve to tolerate a lobectomy.  Radiology raised the question of some pulmonary hypertension with a dilated main pulmonary artery.  She does not have any signs or symptoms of right heart failure to suggest significant pulmonary hypertension.  I described the general nature of the procedure to the patient and her family.  They understand we would do a minimally invasive approach.  Proposed procedure would be right VATS for right lower lobectomy.  They understand the need for general anesthesia, the incisions to be used, the use of a drainage tube postoperatively, the expected hospital stay, and the overall recovery.  They understand there is no guarantee of a cure.  I informed him of the indications, risk, benefits, and alternatives.  They understand the risk include, but not limited to death, MI, DVT, PE, bleeding, possible need for transfusion, infection, prolonged air leak, cardiac arrhythmias, as well as the possibility of other unforeseeable complications.  She is relatively high risk given her cardiac history.  She understands accepts the risks and wishes to proceed.  Tobacco abuse- Smoking cessation instruction/counseling given:  counseled patient on the dangers of tobacco use, advised patient to stop smoking, and reviewed strategies to maximize success   Plan:  Right VATS for  right lower lobectomy on Monday 1 08/30/2018  Melrose Nakayama, MD [facility] [phone]

## 2018-03-17 NOTE — Progress Notes (Signed)
PCP is [name], MD Referring Provider is Tanda Rockers, MD  Chief Complaint  Patient presents with  . Lung Lesion    new patient consultation, PET 02/14/18, PFTs 03/07/18    HPI: Maria Arroyo sent for consultation regarding a right lower lobe lung mass.  Maria Arroyo is a 67 year old woman with a long history of tobacco abuse (2 packs/day for greater than 35 years), COPD, coronary artery disease, four previous coronary stents, hypertension, hyperlipidemia, osteoarthritis, cervical disc disease, chronic back pain, neuropathy, gout, and depression.  She recently saw Dr. Melford Aase.  She had been feeling poorly.  She mentioned that she had a 25 pound weight loss over 3 months.  She complained of early satiety and also occasional nausea.  He did a work-up for an occult malignancy including a chest x-ray which showed a question of a right lower lobe lung nodule.  She was referred to Dr. Melvyn Novas.  He recommended a PET CT.  That showed the mass was markedly hypermetabolic with an SUV of 11.  She had some healing rib fractures on the right side from a fall a few months prior.  There was no evidence of regional or distant metastases.  She says that her weight has stabilized over the past few weeks.  Her appetite is gotten a little better.  She thinks 1 of her medications may have caused some of her issues in that regard.  Her activities are limited primarily by back pain.  She walks on level ground without any significant shortness of breath.  She does get short of breath walking up an incline.  She denies cough or hemoptysis.  She has not had any unusual headaches or visual changes.  Zubrod Score: At the time of surgery this patient's most appropriate activity status/level should be described as: []     0    Normal activity, no symptoms [x]     1    Restricted in physical strenuous activity but ambulatory, able to do out light work []     2    Ambulatory and capable of self care, unable to do work  activities, up and about >50 % of waking hours                              []     3    Only limited self care, in bed greater than 50% of waking hours []     4    Completely disabled, no self care, confined to bed or chair []     5    Moribund  Past Medical History:  Diagnosis Date  . Cervical disc disease   . COPD (chronic obstructive pulmonary disease) ([place])   . Coronary artery disease   . Depression   . Gout    R foot and knee  . Hyperlipidemia   . Hypertension   . Neuropathy   . OA (osteoarthritis)   . Tobacco abuse     Past Surgical History:  Procedure Laterality Date  . ABDOMINAL HYSTERECTOMY    . ANTERIOR CERVICAL DECOMP/DISCECTOMY FUSION  02/06/2011   Procedure: ANTERIOR CERVICAL DECOMPRESSION/DISCECTOMY FUSION 2 LEVELS;  Surgeon: Winfield Cunas;  Location: [facility];  Service: Neurosurgery;  Laterality: N/A;  Anterior Cervical Four-Five/Five-Six Decompression and Fusion with Plating and Bonegraft  . BLADDER SURGERY    . CARDIAC CATHETERIZATION  04/09/2009   EF 60%  . CARDIAC CATHETERIZATION  03/24/2001   EF 55%  .  CARDIAC CATHETERIZATION  01/06/2001   EF 65%  . CHOLECYSTECTOMY    . CORONARY STENT PLACEMENT  03/2009   STENTING OF THE PROXIMAL TO MID RIGHT CORONARY  . LEFT HEART CATHETERIZATION WITH CORONARY ANGIOGRAM N/A 12/21/2011   Procedure: LEFT HEART CATHETERIZATION WITH CORONARY ANGIOGRAM;  Surgeon: Peter M Martinique, MD;  Location: [facility];  Service: Cardiovascular;  Laterality: N/A;  . OTHER SURGICAL HISTORY  12/2014   R foot cyst removal    Family History  Problem Relation Age of Onset  . Aneurysm Mother   . Heart attack Father   . Heart failure Father   . Stroke Sister   . Heart attack Brother     Social History Social History   Tobacco Use  . Smoking status: Current Every Day Smoker    Packs/day: 2.00    Years: 35.00    Pack years: 70.00    Types: Cigarettes  . Smokeless tobacco: Never Used  . Tobacco comment: 1-1.5 ppd (10/02/13), 03/17/18 2  PPD  Substance Use Topics  . Alcohol use: No    Alcohol/week: 0.0 standard drinks  . Drug use: No    Current Outpatient Medications  Medication Sig Dispense Refill  . allopurinol (ZYLOPRIM) 100 MG tablet Take 100 mg by mouth daily.    [name] amLODipine (NORVASC) 10 MG tablet Take 10 mg by mouth daily.     [name] aspirin 81 MG tablet Take 81 mg by mouth daily.    . bisoprolol (ZEBETA) 5 MG tablet Take 1 tablet (5 mg total) by mouth daily. 30 tablet 11  . Calcium Carbonate-Vitamin D (CALCIUM 600 + D PO) Take 1 tablet by mouth daily.     . carisoprodol (SOMA) 350 MG tablet Take 350 mg by mouth 4 (four) times daily as needed. For muscle spasms    . Cholecalciferol (VITAMIN D) 2000 UNITS tablet Take 4,000 Units by mouth daily.     . clopidogrel (PLAVIX) 75 MG tablet Take 1 tablet (75 mg total) by mouth daily. NEED OV. 90 tablet 0  . gabapentin (NEURONTIN) 600 MG tablet Take 1 tablet (600 mg total) by mouth 4 (four) times daily. At 8am 1pm 6pm and 10pm 120 tablet 11  . losartan (COZAAR) 50 MG tablet Take 50 mg by mouth daily.   4  . Multiple Vitamin (MULTIVITAMIN) tablet Take 1 tablet by mouth daily.     . nitroGLYCERIN (NITROSTAT) 0.4 MG SL tablet Place 1 tablet (0.4 mg total) under the tongue every 5 (five) minutes as needed. For chest pain 100 tablet 0  . nystatin cream (MYCOSTATIN) APP EXT AA BID prn  1  . oxyCODONE-acetaminophen (PERCOCET) 10-325 MG tablet TK 1 T PO QID prn  0  . PROVENTIL HFA 108 (90 Base) MCG/ACT inhaler Inhale 1-2 puffs into the lungs. Prn  4  . rosuvastatin (CRESTOR) 40 MG tablet Take 1 tablet (40 mg total) by mouth daily. NEED OV. 90 tablet 0  . torsemide (DEMADEX) 20 MG tablet Take 20 mg by mouth daily.      [name] venlafaxine XR (EFFEXOR-XR) 75 MG 24 hr capsule TK 1 C PO BID  0   No current facility-administered medications for this visit.     Allergies  Allergen Reactions  . Ace Inhibitors Other (See Comments)    Worsening renal function   . Codeine   .  Codeine-Guaifenesin [Guaifenesin-Codeine]     Some nausea--but ok with percocet    Review of Systems  Constitutional: Positive for appetite change  and unexpected weight change (Lost 25 pounds in 3 months).  HENT: Negative for trouble swallowing and voice change.   Eyes: Negative for visual disturbance.  Respiratory: Positive for shortness of breath. Negative for cough and wheezing.   Cardiovascular: Negative for chest pain and leg swelling.  Gastrointestinal: Positive for nausea. Negative for abdominal distention and abdominal pain.  Genitourinary: Negative for difficulty urinating and dysuria.  Musculoskeletal: Positive for arthralgias, back pain and neck pain.       Rib pain after fall 3 months ago  Neurological: Positive for numbness. Negative for seizures, weakness and headaches.  Hematological: Negative for adenopathy. Bruises/bleeds easily.  All other systems reviewed and are negative.   BP 110/70 (BP Location: Left Arm, Patient Position: Sitting, Cuff Size: Normal)   Pulse (!) 56   Resp 18   Ht 5' 0.5" (1.537 m)   Wt 158 lb 12.8 oz (72 kg)   SpO2 96% Comment: RA  BMI 30.50 kg/m  Physical Exam Vitals signs reviewed.  Constitutional:      General: She is not in acute distress.    Appearance: She is obese.  HENT:     Head: Normocephalic and atraumatic.  Eyes:     General: No scleral icterus.    Extraocular Movements: Extraocular movements intact.     Pupils: Pupils are equal, round, and reactive to light.  Neck:     Musculoskeletal: Neck supple.  Cardiovascular:     Rate and Rhythm: Normal rate and regular rhythm.     Heart sounds: Normal heart sounds. No murmur.  Pulmonary:     Effort: Pulmonary effort is normal.     Breath sounds: No wheezing or rales.     Comments: Diminished breath sounds bilaterally Musculoskeletal:        General: No swelling.  Lymphadenopathy:     Cervical: No cervical adenopathy.  Skin:    General: Skin is warm and dry.  Neurological:      General: No focal deficit present.     Mental Status: She is alert and oriented to person, place, and time.     Cranial Nerves: No cranial nerve deficit.     Motor: No weakness.     Coordination: Coordination normal.  Psychiatric:     Comments: Flat affect    Diagnostic Tests: NUCLEAR MEDICINE PET SKULL BASE TO THIGH  TECHNIQUE: 7.6 mCi F-18 FDG was injected intravenously. Full-ring PET imaging was performed from the skull base to thigh after the radiotracer. CT data was obtained and used for attenuation correction and anatomic localization.  Fasting blood glucose: 120 mg/dl  COMPARISON:  Chest radiograph 02/05/2011. Abdominopelvic CT of 09/13/2009.  FINDINGS: Mediastinal blood pool activity: SUV max 2.4  NECK: No areas of abnormal hypermetabolism.  Incidental CT findings: No cervical adenopathy. Fatty atrophy involving the right parotid gland. Bilateral carotid atherosclerosis.  CHEST: No thoracic nodal hypermetabolism. Spiculated right lower lobe pulmonary nodule measures 2.5 x 2.2 cm and a S.U.V. max of 11.2 on image 45/8. Contiguous or adjacent satellite nodule of 9 mm on image 47/8, immediately inferiorly.  Incidental CT findings: Aortic atherosclerosis. Multivessel coronary artery atherosclerosis. Pulmonary artery enlargement, outflow tract 3.9 cm. Moderate centrilobular emphysema.  ABDOMEN/PELVIS: Left inguinal node measures 11 mm and a S.U.V. max of 4.0 on image 167/4. This maintains its fatty hilum.  No abdominopelvic nodal or parenchymal hypermetabolism otherwise.  Incidental CT findings: Adrenal thickening and right-sided low-density nodularity, most consistent with an adenoma. Cholecystectomy. Left renal atrophy. Upper pole low-density left renal  lesion is likely a cyst. Punctate right renal collecting system calculi. Nonaneurysmal infrarenal aortic dilatation at 2.6 cm. Hysterectomy. Pelvic floor laxity.  SKELETON: Hypermetabolism  corresponding to right anterolateral seventh through ninth healing rib fractures. No suspicious osseous hypermetabolism.  Incidental CT findings: Osteopenia.  Cervical spine fixation.  IMPRESSION: 1. Hypermetabolic right lower lobe pulmonary nodule, consistent with primary bronchogenic carcinoma. Presuming non-small-cell histology, T1cN0M0 or stage IA. 2. Left inguinal hypermetabolic lymph node, favored to be reactive. 3. Coronary artery atherosclerosis. Aortic Atherosclerosis (ICD10-I70.0). 4. Pulmonary artery enlargement suggests pulmonary arterial hypertension. 5. Healing posttraumatic lower right rib fractures.   Electronically Signed   By: Abigail Miyamoto M.D.   On: 02/14/2018 14:41 I personally reviewed the PET images and concur with the findings noted above.  If there is truly a satellite nodule this would be a T3, N0, stage IIb  Pulmonary function testing FVC 1.56 (58%) FEV1 1.10 (54%) FEV1/FVC 0.71 FEV1 1.29 (64%) postbronchodilator DLCO 9.78 (50%)  Impression: Maria Arroyo is a 67 year old woman with a history of heavy tobacco abuse, COPD, coronary artery disease, stents, hypertension, hyperlipidemia, depression, chronic back and neck pain, neuropathy, and gout.  She recently presented with a 25 pound weight loss over 3 months and general malaise.  Work-up has revealed a 2.5 cm mass in the right lower lobe that is markedly hypermetabolic on PET/CT.  There is a small 8 mm nodule adjacent to the primary lesion consistent with a satellite lesion.  This would either be T1N0 or more likely T3N0 stage IIb non-small cell lung cancer.  I reviewed the PET images with Maria Arroyo and her family.  They understand this almost certainly is a lung cancer.  Infectious and inflammatory nodules are also in the differential but are far less likely and we have to treat this as lung cancer unless we can prove otherwise.  She has a history of coronary disease and has had prior stents  placed.  She is on Plavix.  She would need to hold that for 5 days prior to surgery.  She is not having any anginal symptoms currently.  I will check with Dr. Martinique, but I do not think there is any need for an extensive cardiac work-up prior to surgery.  She does have adequate pulmonary reserve to tolerate a lobectomy.  Radiology raised the question of some pulmonary hypertension with a dilated main pulmonary artery.  She does not have any signs or symptoms of right heart failure to suggest significant pulmonary hypertension.  I described the general nature of the procedure to the patient and her family.  They understand we would do a minimally invasive approach.  Proposed procedure would be right VATS for right lower lobectomy.  They understand the need for general anesthesia, the incisions to be used, the use of a drainage tube postoperatively, the expected hospital stay, and the overall recovery.  They understand there is no guarantee of a cure.  I informed him of the indications, risk, benefits, and alternatives.  They understand the risk include, but not limited to death, MI, DVT, PE, bleeding, possible need for transfusion, infection, prolonged air leak, cardiac arrhythmias, as well as the possibility of other unforeseeable complications.  She is relatively high risk given her cardiac history.  She understands accepts the risks and wishes to proceed.  Tobacco abuse- Smoking cessation instruction/counseling given:  counseled patient on the dangers of tobacco use, advised patient to stop smoking, and reviewed strategies to maximize success   Plan:  Right VATS for  right lower lobectomy on Monday 1 08/30/2018  Melrose Nakayama, MD [facility] [phone]

## 2018-03-17 NOTE — Patient Instructions (Signed)
Continue gabapentin at current dose will refill for 1 year Needs to  walk daily for overall general health Stay well-hydrated by drinking water Follow-up yearly and when necessary

## 2018-03-17 NOTE — Progress Notes (Signed)
I have read the note, and I agree with the clinical assessment and plan.  Liela Rylee K Brynnleigh Mcelwee   

## 2018-03-18 ENCOUNTER — Ambulatory Visit: Payer: Medicare Other | Admitting: Nurse Practitioner

## 2018-03-18 ENCOUNTER — Encounter: Payer: Self-pay | Admitting: *Deleted

## 2018-03-18 ENCOUNTER — Other Ambulatory Visit: Payer: Self-pay | Admitting: *Deleted

## 2018-03-18 DIAGNOSIS — R911 Solitary pulmonary nodule: Secondary | ICD-10-CM

## 2018-03-29 NOTE — Pre-Procedure Instructions (Signed)
Maria Arroyo  03/29/2018      [facility] #[name], [place] AT [address] [place] [address] Phone: [phone] Fax: [phone]    Your procedure is scheduled on January 6th.  Report to [facility] Admitting at 5:30 A.M.  Call this number if you have problems the morning of surgery:  [phone]   Remember:  Do not eat or drink after midnight.     Take these medicines the morning of surgery with A SIP OF WATER   Bisoprolol   Buspar  Carisoprodol (Soma)  Gabapentin (Neurontin)  Nitroglycerin - if needed  Oxycodone - if needed  Proventil Inhaler - if needed   Follow your surgeon's instructions on when to stop Asprin & Plavix.  If no instructions were given by your surgeon then you will need to call the office to get those instructions.    7 days prior to surgery STOP taking any Aspirin (unless otherwise instructed by your surgeon), Aleve, Naproxen, Ibuprofen, Motrin, Advil, Goody's, BC's, all herbal medications, fish oil, and all vitamins. Do not wear jewelry, make-up or nail polish.     Do not wear lotions, powders, or perfumes, or deodorant.  Do not shave 48 hours prior to surgery.    Do not bring valuables to the hospital.  [facility] is not responsible for any belongings or valuables.   [facility]- Preparing For Surgery  Before surgery, you can play an important role. Because skin is not sterile, your skin needs to be as free of germs as possible. You can reduce the number of germs on your skin by washing with CHG (chlorahexidine gluconate) Soap before surgery.  CHG is an antiseptic cleaner which kills germs and bonds with the skin to continue killing germs even after washing.    Oral Hygiene is also important to reduce your risk of infection.  Remember - BRUSH YOUR TEETH THE MORNING OF SURGERY WITH YOUR REGULAR TOOTHPASTE  Please do not use if you have an allergy to  CHG or antibacterial soaps. If your skin becomes reddened/irritated stop using the CHG.  Do not shave (including legs and underarms) for at least 48 hours prior to first CHG shower. It is OK to shave your face.  Please follow these instructions carefully.   1. Shower the NIGHT BEFORE SURGERY and the MORNING OF SURGERY with CHG.   2. If you chose to wash your hair, wash your hair first as usual with your normal shampoo.  3. After you shampoo, rinse your hair and body thoroughly to remove the shampoo.  4. Use CHG as you would any other liquid soap. You can apply CHG directly to the skin and wash gently with a scrungie or a clean washcloth.   5. Apply the CHG Soap to your body ONLY FROM THE NECK DOWN.  Do not use on open wounds or open sores. Avoid contact with your eyes, ears, mouth and genitals (private parts). Wash Face and genitals (private parts)  with your normal soap.  6. Wash thoroughly, paying special attention to the area where your surgery will be performed.  7. Thoroughly rinse your body with warm water from the neck down.  8. DO NOT shower/wash with your normal soap after using and rinsing off the CHG Soap.  9. Pat yourself dry with a CLEAN TOWEL.  10. Wear CLEAN PAJAMAS to bed the night before surgery, wear comfortable  clothes the morning of surgery  11. Place CLEAN SHEETS on your bed the night of your first shower and DO NOT SLEEP WITH PETS.   Day of Surgery:  Do not apply any deodorants/lotions.  Please wear clean clothes to the hospital/surgery center.   Remember to brush your teeth WITH YOUR REGULAR TOOTHPASTE.   Contacts, dentures or bridgework may not be worn into surgery.  Leave your suitcase in the car.  After surgery it may be brought to your room.  For patients admitted to the hospital, discharge time will be determined by your treatment team.  Patients discharged the day of surgery will not be allowed to drive home.

## 2018-03-30 ENCOUNTER — Other Ambulatory Visit: Payer: Self-pay | Admitting: Nurse Practitioner

## 2018-03-31 ENCOUNTER — Encounter (HOSPITAL_COMMUNITY): Payer: Self-pay

## 2018-03-31 ENCOUNTER — Other Ambulatory Visit: Payer: Self-pay

## 2018-03-31 ENCOUNTER — Encounter (HOSPITAL_COMMUNITY)
Admission: RE | Admit: 2018-03-31 | Discharge: 2018-03-31 | Disposition: A | Discharge status: Home / Self Care | Payer: Medicare Other | Source: Ambulatory Visit | Attending: Thoracic Surgery (Cardiothoracic Vascular Surgery) | Admitting: Thoracic Surgery (Cardiothoracic Vascular Surgery)

## 2018-03-31 DIAGNOSIS — Z01818 Encounter for other preprocedural examination: Secondary | ICD-10-CM | POA: Diagnosis present

## 2018-03-31 DIAGNOSIS — R911 Solitary pulmonary nodule: Secondary | ICD-10-CM

## 2018-03-31 LAB — CBC
HCT: 37.8 % (ref 36.0–46.0)
HEMOGLOBIN: 11.9 g/dL — AB (ref 12.0–15.0)
MCH: 29.7 pg (ref 26.0–34.0)
MCHC: 31.5 g/dL (ref 30.0–36.0)
MCV: 94.3 fL (ref 80.0–100.0)
Platelets: 271 10*3/uL (ref 150–400)
RBC: 4.01 MIL/uL (ref 3.87–5.11)
RDW: 15 % (ref 11.5–15.5)
WBC: 10.2 10*3/uL (ref 4.0–10.5)
nRBC: 0 % (ref 0.0–0.2)

## 2018-03-31 LAB — SURGICAL PCR SCREEN
MRSA, PCR: NEGATIVE
Staphylococcus aureus: NEGATIVE

## 2018-03-31 LAB — COMPREHENSIVE METABOLIC PANEL
ALK PHOS: 101 U/L (ref 38–126)
ALT: 22 U/L (ref 0–44)
AST: 17 U/L (ref 15–41)
Albumin: 3.2 g/dL — ABNORMAL LOW (ref 3.5–5.0)
Anion gap: 12 (ref 5–15)
BUN: 30 mg/dL — ABNORMAL HIGH (ref 8–23)
CALCIUM: 8.5 mg/dL — AB (ref 8.9–10.3)
CO2: 22 mmol/L (ref 22–32)
CREATININE: 1.45 mg/dL — AB (ref 0.44–1.00)
Chloride: 106 mmol/L (ref 98–111)
GFR calc Af Amer: 43 mL/min — ABNORMAL LOW (ref >60)
GFR, EST NON AFRICAN AMERICAN: 37 mL/min — AB (ref >60)
Glucose, Bld: 128 mg/dL — ABNORMAL HIGH (ref 70–99)
Potassium: 4.3 mmol/L (ref 3.5–5.1)
Sodium: 140 mmol/L (ref 135–145)
TOTAL PROTEIN: 6.5 g/dL (ref 6.5–8.1)
Total Bilirubin: 0.3 mg/dL (ref 0.3–1.2)

## 2018-03-31 LAB — URINALYSIS, ROUTINE W REFLEX MICROSCOPIC
Bilirubin Urine: NEGATIVE
GLUCOSE, UA: NEGATIVE mg/dL
Hgb urine dipstick: NEGATIVE
Ketones, ur: NEGATIVE mg/dL
Leukocytes, UA: NEGATIVE
Nitrite: NEGATIVE
PH: 5 (ref 5.0–8.0)
Protein, ur: NEGATIVE mg/dL
SPECIFIC GRAVITY, URINE: 1.017 (ref 1.005–1.030)

## 2018-03-31 LAB — TYPE AND SCREEN
ABO/RH(D): O POS
Antibody Screen: NEGATIVE

## 2018-03-31 LAB — PROTIME-INR
INR: 0.94
PROTHROMBIN TIME: 12.5 s (ref 11.4–15.2)

## 2018-03-31 LAB — ABO/RH: ABO/RH(D): O POS

## 2018-03-31 LAB — APTT: APTT: 28 s (ref 24–36)

## 2018-03-31 NOTE — Progress Notes (Signed)
Sent IB message to Levonne Spiller noting there were abnormal labs.

## 2018-03-31 NOTE — Progress Notes (Signed)
PCP - Dr. Melford Aase Cardiologist - Dr. Martinique Pulmonologist - Dr. Melvyn Novas  Chest x-ray - DOS EKG - 03/31/2018 Stress Test - 2004 ECHO - patient denies Cardiac Cath - 2013 - in [place] under surgeries  Sleep Study - patient denies CPAP -   Fasting Blood Sugar - n/a Checks Blood Sugar _____ times a day  Blood Thinner Instructions: last dose of plavix was 12/31 Aspirin Instructions: patient was not given any instructions, patient to contact dr. Leonarda Salon office  Anesthesia review: yes, patient has hx of CAD  Patient denies shortness of breath, fever, cough and chest pain at PAT appointment   Patient verbalized understanding of instructions that were given to them at the PAT appointment. Patient was also instructed that they will need to review over the PAT instructions again at home before surgery.

## 2018-03-31 NOTE — Pre-Procedure Instructions (Signed)
JAZZ BIDDY  03/31/2018      [facility] #[name], [place] [place] AT [address] [place] [address] Phone: [phone] Fax: [phone]    Your procedure is scheduled on January 6th.  Report to [facility] Admitting at 5:30 A.M.  Call this number if you have problems the morning of surgery:  [phone]   Remember:  Do not eat or drink after midnight.     Take these medicines the morning of surgery with A SIP OF WATER   Allopurinol (Zyloprim)   Bisoprolol (Zebeta)  Buspirone (Buspar)  Carisoprodol (Soma) - if needed  Gabapentin (Neurontin)  Nitroglycerin - if needed  Oxycodone-acetaminophen (Percocet) - if needed  Proventil Inhaler - if needed  Venlafaxine XR (Effexor-XR)   Follow your surgeon's instructions on when to stop Asprin & Plavix.  If no instructions were given by your surgeon then you will need to call the office to get those instructions.    7 days prior to surgery STOP taking any Aspirin (unless otherwise instructed by your surgeon), Aleve, Naproxen, Ibuprofen, Motrin, Advil, Goody's, BC's, all herbal medications, fish oil, and all vitamins. Do not wear jewelry, make-up or nail polish.     Do not wear lotions, powders, or perfumes, or deodorant.  Do not shave 48 hours prior to surgery.    Do not bring valuables to the hospital.  [facility] is not responsible for any belongings or valuables.   [place]- Preparing For Surgery  Before surgery, you can play an important role. Because skin is not sterile, your skin needs to be as free of germs as possible. You can reduce the number of germs on your skin by washing with CHG (chlorahexidine gluconate) Soap before surgery.  CHG is an antiseptic cleaner which kills germs and bonds with the skin to continue killing germs even after washing.    Oral Hygiene is also important to reduce your risk of infection.  Remember - BRUSH  YOUR TEETH THE MORNING OF SURGERY WITH YOUR REGULAR TOOTHPASTE  Please do not use if you have an allergy to CHG or antibacterial soaps. If your skin becomes reddened/irritated stop using the CHG.  Do not shave (including legs and underarms) for at least 48 hours prior to first CHG shower. It is OK to shave your face.  Please follow these instructions carefully.   1. Shower the NIGHT BEFORE SURGERY and the MORNING OF SURGERY with CHG.   2. If you chose to wash your hair, wash your hair first as usual with your normal shampoo.  3. After you shampoo, rinse your hair and body thoroughly to remove the shampoo.  4. Use CHG as you would any other liquid soap. You can apply CHG directly to the skin and wash gently with a scrungie or a clean washcloth.   5. Apply the CHG Soap to your body ONLY FROM THE NECK DOWN.  Do not use on open wounds or open sores. Avoid contact with your eyes, ears, mouth and genitals (private parts). Wash Face and genitals (private parts)  with your normal soap.  6. Wash thoroughly, paying special attention to the area where your surgery will be performed.  7. Thoroughly rinse your body with warm water from the neck down.  8. DO NOT shower/wash with your normal soap after using and rinsing off the CHG Soap.  9. Pat yourself dry with a CLEAN TOWEL.  10. Wear CLEAN PAJAMAS to bed the night before surgery, wear comfortable clothes the morning of surgery  11. Place CLEAN SHEETS on your bed the night of your first shower and DO NOT SLEEP WITH PETS.   Day of Surgery: Shower as stated above. Do not apply any deodorants/lotions.  Please wear clean clothes to the hospital/surgery center.   Remember to brush your teeth WITH YOUR REGULAR TOOTHPASTE.   Contacts, eyeglasses, hearing aids, dentures or bridgework may not be worn into surgery.  Leave your suitcase in the car.  After surgery it may be brought to your room.  For patients admitted to the hospital, discharge time  will be determined by your treatment team.  Patients discharged the day of surgery will not be allowed to drive home.

## 2018-04-01 LAB — BLOOD GAS, ARTERIAL
Acid-Base Excess: 2.3 mmol/L — ABNORMAL HIGH (ref 0.0–2.0)
Bicarbonate: 26.7 mmol/L (ref 20.0–28.0)
Drawn by: 470591
FIO2: 0.21
O2 Saturation: 94.2 %
Patient temperature: 98.6
pCO2 arterial: 44.2 mmHg (ref 32.0–48.0)
pH, Arterial: 7.398 (ref 7.350–7.450)
pO2, Arterial: 70.3 mmHg — ABNORMAL LOW (ref 83.0–108.0)

## 2018-04-01 NOTE — Progress Notes (Addendum)
Anesthesia Chart Review:  Case:  [id number] Date/Time:  04/04/18 0715   Procedure:  VIDEO ASSISTED THORACOSCOPY (VATS)/RIGHT LOWER LOBECTOMY (Right Chest)   Anesthesia type:  General   Pre-op diagnosis:  RLL NODULE   Location:  [facility] OR ROOM 10 / [facility]   Surgeon:  Melrose Nakayama, MD      DISCUSSION:  68 year old woman with a history of heavy tobacco abuse, COPD, coronary artery disease, stents, hypertension, hyperlipidemia, depression, chronic back and neck pain, neuropathy, and gout.  Follows with Dr. Martinique for hx of CAD. Per last OV note 01/14/17, she had mid RCA stent 1999, proximal RCA stent 2002, stent between the prox and mid RCA stents in 2011. In 2013 she had repeat cath showing nonobstructive coronary disease. There was modest disease throughout the mid RCA that was unchanged compared to her prior cardiac catheterization 2011. The remainder of the stents were widely patent. She was noted to have normal left ventricular function with an ejection fraction of 55-60%.  Since that time she has been maintained on dual antiplatelet therapy with aspirin plus Plavix.  In June of 2019 she was cleared for colonoscopy with clearance stating "Maria Arroyo has done well without any new CP or SOB. Does not exercise but remains relatively active without angina. RCRI 0.9% indicating low risk of cardiac complications."  Dr. Leonarda Salon note from 03/17/2018 stated "Radiology raised the question of some pulmonary hypertension with a dilated main pulmonary artery.  She does not have any signs or symptoms of right heart failure to suggest significant pulmonary hypertension." He did say that she is relatively high risk given her cardiac history. Per staff message from Levonne Spiller, Dr. Roxan Hockey discussed the pt with cardiology, Dr. Martinique, and he felt no cardiac workup needed prior to surgery. Per note she will stop Plavix 5d preop.  Anticipate she can proceed as planned barring acute status change.    VS: BP (!) 151/65   Pulse (!) 45 Comment: notified Michel RN  Temp 36.7 C   Ht 5' 0.5" (1.537 m)   Wt 72.6 kg   SpO2 97%   BMI 30.73 kg/m   PROVIDERS: [name], MD is PCP  Martinique, Peter, MD is Cardiologist  Christinia Gully, MD is Pulmonologist  LABS: Labs reviewed: Acceptable for surgery. Mildly elevated creatinine, Dr Roxan Hockey aware. (all labs ordered are listed, but only abnormal results are displayed)  Labs Reviewed  BLOOD GAS, ARTERIAL - Abnormal; Notable for the following components:      Result Value   pO2, Arterial 70.3 (*)    Acid-Base Excess 2.3 (*)    All other components within normal limits  CBC - Abnormal; Notable for the following components:   Hemoglobin 11.9 (*)    All other components within normal limits  COMPREHENSIVE METABOLIC PANEL - Abnormal; Notable for the following components:   Glucose, Bld 128 (*)    BUN 30 (*)    Creatinine, Ser 1.45 (*)    Calcium 8.5 (*)    Albumin 3.2 (*)    GFR calc non Af Amer 37 (*)    GFR calc Af Amer 43 (*)    All other components within normal limits  SURGICAL PCR SCREEN  APTT  PROTIME-INR  URINALYSIS, ROUTINE W REFLEX MICROSCOPIC  TYPE AND SCREEN  ABO/RH     IMAGES: PET scan 02/14/2018: IMPRESSION: 1. Hypermetabolic right lower lobe pulmonary nodule, consistent with primary bronchogenic carcinoma. Presuming non-small-cell histology, T1cN0M0 or stage IA. 2. Left inguinal hypermetabolic  lymph node, favored to be reactive. 3. Coronary artery atherosclerosis. Aortic Atherosclerosis (ICD10-I70.0). 4. Pulmonary artery enlargement suggests pulmonary arterial hypertension. 5. Healing posttraumatic lower right rib fractures.   EKG: 03/31/2018: Sinus bradycardia with 1st degree A-V block. Rate 47.  CV: Cath 12/21/2011: Coronary angiography: Coronary dominance: right  Left mainstem: Normal.  Left anterior descending (LAD): Minor irregularities in the mid vessel up to 20%.  There is a  moderately large ramus intermediate branch which is normal.  Left circumflex (LCx): Minor irregularities less than 10%. The left circumflex gives rise to 2 marginal branches.  Right coronary artery (RCA): The right coronary has extensive stents from the proximal to mid vessel. The proximal vessel is widely patent area to in the mid vessel there is diffuse 50% disease of the more distal stent. There is 60% narrowing at the distal stent margin.  Left ventriculography: Left ventricular systolic function is normal, LVEF is estimated at 55-65%, there is no significant mitral regurgitation   Final Conclusions:    1. Nonobstructive coronary disease. There is modest disease throughout the mid RCA that is unchanged compared to prior cardiac catheterization in 2011. The remainder of the stents are widely patent. 2. Normal left ventricular function.   Recommendations: Recommend continued medical therapy.  Past Medical History:  Diagnosis Date  . Cervical disc disease   . COPD (chronic obstructive pulmonary disease) ([place])   . Coronary artery disease   . Depression   . Gout    R foot and knee  . Hyperlipidemia   . Hypertension   . Neuropathy   . OA (osteoarthritis)   . Tobacco abuse     Past Surgical History:  Procedure Laterality Date  . ABDOMINAL HYSTERECTOMY    . ANTERIOR CERVICAL DECOMP/DISCECTOMY FUSION  02/06/2011   Procedure: ANTERIOR CERVICAL DECOMPRESSION/DISCECTOMY FUSION 2 LEVELS;  Surgeon: Winfield Cunas;  Location: [facility];  Service: Neurosurgery;  Laterality: N/A;  Anterior Cervical Four-Five/Five-Six Decompression and Fusion with Plating and Bonegraft  . BLADDER SURGERY    . CARDIAC CATHETERIZATION  04/09/2009   EF 60%  . CARDIAC CATHETERIZATION  03/24/2001   EF 55%  . CARDIAC CATHETERIZATION  01/06/2001   EF 65%  . CHOLECYSTECTOMY    . CORONARY STENT PLACEMENT  03/2009   STENTING OF THE PROXIMAL TO MID RIGHT CORONARY  . LEFT HEART CATHETERIZATION WITH  CORONARY ANGIOGRAM N/A 12/21/2011   Procedure: LEFT HEART CATHETERIZATION WITH CORONARY ANGIOGRAM;  Surgeon: Peter M Martinique, MD;  Location: [facility] CATH LAB;  Service: Cardiovascular;  Laterality: N/A;  . OTHER SURGICAL HISTORY  12/2014   R foot cyst removal    MEDICATIONS: . allopurinol (ZYLOPRIM) 100 MG tablet  . aspirin 81 MG tablet  . bisoprolol (ZEBETA) 5 MG tablet  . busPIRone (BUSPAR) 15 MG tablet  . Calcium Carbonate-Vitamin D (CALCIUM 600 + D PO)  . carisoprodol (SOMA) 350 MG tablet  . Cholecalciferol (VITAMIN D) 2000 UNITS tablet  . clopidogrel (PLAVIX) 75 MG tablet  . gabapentin (NEURONTIN) 600 MG tablet  . losartan (COZAAR) 50 MG tablet  . Multiple Vitamin (MULTIVITAMIN) tablet  . nitroGLYCERIN (NITROSTAT) 0.4 MG SL tablet  . oxyCODONE-acetaminophen (PERCOCET) 10-325 MG tablet  . PROVENTIL HFA 108 (90 Base) MCG/ACT inhaler  . rosuvastatin (CRESTOR) 40 MG tablet  . torsemide (DEMADEX) 20 MG tablet  . venlafaxine XR (EFFEXOR-XR) 75 MG 24 hr capsule   No current facility-administered medications for this encounter.     Karoline Caldwell, PA-C [facility]/Anesthesiology Phone [phone])  [phone] 04/01/2018 10:12 AM

## 2018-04-01 NOTE — Anesthesia Preprocedure Evaluation (Addendum)
Anesthesia Evaluation  Patient identified by MRN, date of birth, ID band Patient awake    Reviewed: Allergy & Precautions, NPO status , Patient's Chart, lab work & pertinent test results  Airway Mallampati: II  TM Distance: >3 FB     Dental   Pulmonary COPD, Current Smoker,    breath sounds clear to auscultation       Cardiovascular hypertension, + CAD   Rhythm:Regular Rate:Normal     Neuro/Psych    GI/Hepatic negative GI ROS,   Endo/Other  negative endocrine ROS  Renal/GU negative Renal ROS     Musculoskeletal   Abdominal   Peds  Hematology   Anesthesia Other Findings   Reproductive/Obstetrics                            Anesthesia Physical Anesthesia Plan  ASA: III  Anesthesia Plan: General   Post-op Pain Management:    Induction: Intravenous  PONV Risk Score and Plan: Ondansetron, Dexamethasone and Midazolam  Airway Management Planned: Double Lumen EBT  Additional Equipment:   Intra-op Plan:   Post-operative Plan: Possible Post-op intubation/ventilation  Informed Consent: I have reviewed the patients History and Physical, chart, labs and discussed the procedure including the risks, benefits and alternatives for the proposed anesthesia with the patient or authorized representative who has indicated his/her understanding and acceptance.   Dental advisory given  Plan Discussed with: CRNA and Anesthesiologist  Anesthesia Plan Comments: (See PAT note by Karoline Caldwell, PA-C )       Anesthesia Quick Evaluation

## 2018-04-04 ENCOUNTER — Inpatient Hospital Stay (HOSPITAL_COMMUNITY): Payer: Medicare Other | Admitting: Physician Assistant

## 2018-04-04 ENCOUNTER — Inpatient Hospital Stay (HOSPITAL_COMMUNITY): Payer: Medicare Other

## 2018-04-04 ENCOUNTER — Encounter (HOSPITAL_COMMUNITY): Payer: Self-pay | Admitting: *Deleted

## 2018-04-04 ENCOUNTER — Inpatient Hospital Stay (HOSPITAL_COMMUNITY)
Admission: RE | Admit: 2018-04-04 | Discharge: 2018-04-09 | DRG: 164 | Disposition: A | Discharge status: Home / Self Care | Payer: Medicare Other | Attending: Thoracic Surgery (Cardiothoracic Vascular Surgery) | Admitting: Thoracic Surgery (Cardiothoracic Vascular Surgery)

## 2018-04-04 ENCOUNTER — Inpatient Hospital Stay (HOSPITAL_COMMUNITY): Payer: Medicare Other | Admitting: Certified Registered Nurse Anesthetist

## 2018-04-04 ENCOUNTER — Other Ambulatory Visit: Payer: Self-pay

## 2018-04-04 ENCOUNTER — Encounter (HOSPITAL_COMMUNITY)
Admission: RE | Disposition: A | Discharge status: Home / Self Care | Payer: Self-pay | Source: Home / Self Care | Attending: Thoracic Surgery (Cardiothoracic Vascular Surgery)

## 2018-04-04 DIAGNOSIS — Z981 Arthrodesis status: Secondary | ICD-10-CM | POA: Diagnosis not present

## 2018-04-04 DIAGNOSIS — Z7902 Long term (current) use of antithrombotics/antiplatelets: Secondary | ICD-10-CM

## 2018-04-04 DIAGNOSIS — W19XXXD Unspecified fall, subsequent encounter: Secondary | ICD-10-CM | POA: Diagnosis present

## 2018-04-04 DIAGNOSIS — Z9049 Acquired absence of other specified parts of digestive tract: Secondary | ICD-10-CM | POA: Diagnosis not present

## 2018-04-04 DIAGNOSIS — Z823 Family history of stroke: Secondary | ICD-10-CM

## 2018-04-04 DIAGNOSIS — M109 Gout, unspecified: Secondary | ICD-10-CM | POA: Diagnosis present

## 2018-04-04 DIAGNOSIS — I4891 Unspecified atrial fibrillation: Secondary | ICD-10-CM | POA: Diagnosis not present

## 2018-04-04 DIAGNOSIS — I361 Nonrheumatic tricuspid (valve) insufficiency: Secondary | ICD-10-CM | POA: Diagnosis not present

## 2018-04-04 DIAGNOSIS — Z79899 Other long term (current) drug therapy: Secondary | ICD-10-CM

## 2018-04-04 DIAGNOSIS — R911 Solitary pulmonary nodule: Secondary | ICD-10-CM | POA: Diagnosis present

## 2018-04-04 DIAGNOSIS — F17210 Nicotine dependence, cigarettes, uncomplicated: Secondary | ICD-10-CM | POA: Diagnosis present

## 2018-04-04 DIAGNOSIS — Z902 Acquired absence of lung [part of]: Secondary | ICD-10-CM

## 2018-04-04 DIAGNOSIS — I2510 Atherosclerotic heart disease of native coronary artery without angina pectoris: Secondary | ICD-10-CM | POA: Diagnosis present

## 2018-04-04 DIAGNOSIS — I700 Atherosclerosis of aorta: Secondary | ICD-10-CM | POA: Diagnosis present

## 2018-04-04 DIAGNOSIS — M8580 Other specified disorders of bone density and structure, unspecified site: Secondary | ICD-10-CM | POA: Diagnosis present

## 2018-04-04 DIAGNOSIS — S2241XD Multiple fractures of ribs, right side, subsequent encounter for fracture with routine healing: Secondary | ICD-10-CM

## 2018-04-04 DIAGNOSIS — G629 Polyneuropathy, unspecified: Secondary | ICD-10-CM | POA: Diagnosis present

## 2018-04-04 DIAGNOSIS — M1990 Unspecified osteoarthritis, unspecified site: Secondary | ICD-10-CM | POA: Diagnosis present

## 2018-04-04 DIAGNOSIS — I4819 Other persistent atrial fibrillation: Secondary | ICD-10-CM | POA: Diagnosis not present

## 2018-04-04 DIAGNOSIS — E785 Hyperlipidemia, unspecified: Secondary | ICD-10-CM | POA: Diagnosis present

## 2018-04-04 DIAGNOSIS — C3431 Malignant neoplasm of lower lobe, right bronchus or lung: Principal | ICD-10-CM | POA: Diagnosis present

## 2018-04-04 DIAGNOSIS — F329 Major depressive disorder, single episode, unspecified: Secondary | ICD-10-CM | POA: Diagnosis present

## 2018-04-04 DIAGNOSIS — J432 Centrilobular emphysema: Secondary | ICD-10-CM | POA: Diagnosis present

## 2018-04-04 DIAGNOSIS — I10 Essential (primary) hypertension: Secondary | ICD-10-CM | POA: Diagnosis present

## 2018-04-04 DIAGNOSIS — M5090 Cervical disc disorder, unspecified, unspecified cervical region: Secondary | ICD-10-CM | POA: Diagnosis present

## 2018-04-04 DIAGNOSIS — I370 Nonrheumatic pulmonary valve stenosis: Secondary | ICD-10-CM | POA: Diagnosis not present

## 2018-04-04 DIAGNOSIS — I4892 Unspecified atrial flutter: Secondary | ICD-10-CM | POA: Diagnosis not present

## 2018-04-04 DIAGNOSIS — Z8249 Family history of ischemic heart disease and other diseases of the circulatory system: Secondary | ICD-10-CM

## 2018-04-04 DIAGNOSIS — G8929 Other chronic pain: Secondary | ICD-10-CM | POA: Diagnosis present

## 2018-04-04 DIAGNOSIS — Z955 Presence of coronary angioplasty implant and graft: Secondary | ICD-10-CM

## 2018-04-04 DIAGNOSIS — Z888 Allergy status to other drugs, medicaments and biological substances status: Secondary | ICD-10-CM | POA: Diagnosis not present

## 2018-04-04 DIAGNOSIS — Z885 Allergy status to narcotic agent status: Secondary | ICD-10-CM | POA: Diagnosis not present

## 2018-04-04 DIAGNOSIS — Z4682 Encounter for fitting and adjustment of non-vascular catheter: Secondary | ICD-10-CM

## 2018-04-04 DIAGNOSIS — Z7982 Long term (current) use of aspirin: Secondary | ICD-10-CM

## 2018-04-04 DIAGNOSIS — Z90710 Acquired absence of both cervix and uterus: Secondary | ICD-10-CM | POA: Diagnosis not present

## 2018-04-04 DIAGNOSIS — I6523 Occlusion and stenosis of bilateral carotid arteries: Secondary | ICD-10-CM | POA: Diagnosis present

## 2018-04-04 HISTORY — PX: NODE DISSECTION: SHX5269

## 2018-04-04 HISTORY — PX: VIDEO ASSISTED THORACOSCOPY (VATS)/ LOBECTOMY: SHX6169

## 2018-04-04 LAB — GLUCOSE, CAPILLARY: Glucose-Capillary: 176 mg/dL — ABNORMAL HIGH (ref 70–99)

## 2018-04-04 SURGERY — VIDEO ASSISTED THORACOSCOPY (VATS)/ LOBECTOMY
Anesthesia: General | Site: Chest | Laterality: Right

## 2018-04-04 MED ORDER — PHENYLEPHRINE 40 MCG/ML (10ML) SYRINGE FOR IV PUSH (FOR BLOOD PRESSURE SUPPORT)
PREFILLED_SYRINGE | INTRAVENOUS | Status: AC
  Filled 2018-04-04: qty 10

## 2018-04-04 MED ORDER — LIDOCAINE 2% (20 MG/ML) 5 ML SYRINGE
INTRAMUSCULAR | Status: AC
  Filled 2018-04-04: qty 5

## 2018-04-04 MED ORDER — NALOXONE HCL 0.4 MG/ML IJ SOLN: 0.4000 mg | INTRAMUSCULAR | Status: DC | PRN

## 2018-04-04 MED ORDER — FENTANYL CITRATE (PF) 250 MCG/5ML IJ SOLN
INTRAMUSCULAR | Status: DC | PRN
  Administered 2018-04-04: 50 ug via INTRAVENOUS
  Administered 2018-04-04: 100 ug via INTRAVENOUS
  Administered 2018-04-04: 50 ug via INTRAVENOUS

## 2018-04-04 MED ORDER — CEFAZOLIN SODIUM-DEXTROSE 2-4 GM/100ML-% IV SOLN
2.0000 g | INTRAVENOUS | Status: AC
  Administered 2018-04-04: 2 g via INTRAVENOUS
  Filled 2018-04-04: qty 100

## 2018-04-04 MED ORDER — PHENYLEPHRINE HCL 10 MG/ML IJ SOLN
INTRAMUSCULAR | Status: DC | PRN
  Administered 2018-04-04 (×2): 80 ug via INTRAVENOUS
  Administered 2018-04-04: 40 ug via INTRAVENOUS

## 2018-04-04 MED ORDER — ONDANSETRON HCL 4 MG/2ML IJ SOLN: 4.0000 mg | Freq: Four times a day (QID) | INTRAMUSCULAR | Status: DC | PRN

## 2018-04-04 MED ORDER — DIPHENHYDRAMINE HCL 12.5 MG/5ML PO ELIX
12.5000 mg | ORAL_SOLUTION | Freq: Four times a day (QID) | ORAL | Status: DC | PRN
  Filled 2018-04-04: qty 5

## 2018-04-04 MED ORDER — EPHEDRINE 5 MG/ML INJ
INTRAVENOUS | Status: AC
  Filled 2018-04-04: qty 10

## 2018-04-04 MED ORDER — ROCURONIUM BROMIDE 50 MG/5ML IV SOSY
PREFILLED_SYRINGE | INTRAVENOUS | Status: AC
  Filled 2018-04-04: qty 5

## 2018-04-04 MED ORDER — ROSUVASTATIN CALCIUM 20 MG PO TABS
40.0000 mg | ORAL_TABLET | Freq: Every day | ORAL | Status: DC
  Administered 2018-04-05 – 2018-04-09 (×5): 40 mg via ORAL
  Filled 2018-04-04 (×5): qty 2

## 2018-04-04 MED ORDER — MIDAZOLAM HCL 5 MG/5ML IJ SOLN
INTRAMUSCULAR | Status: DC | PRN
  Administered 2018-04-04: 2 mg via INTRAVENOUS

## 2018-04-04 MED ORDER — BUPIVACAINE HCL (PF) 0.5 % IJ SOLN
INTRAMUSCULAR | Status: DC | PRN
  Administered 2018-04-04: 30 mL

## 2018-04-04 MED ORDER — ACETAMINOPHEN 160 MG/5ML PO SOLN: 1000.0000 mg | Freq: Four times a day (QID) | ORAL | Status: DC

## 2018-04-04 MED ORDER — ASPIRIN EC 81 MG PO TBEC
81.0000 mg | DELAYED_RELEASE_TABLET | Freq: Every day | ORAL | Status: DC
  Administered 2018-04-05 – 2018-04-09 (×5): 81 mg via ORAL
  Filled 2018-04-04 (×5): qty 1

## 2018-04-04 MED ORDER — SUCCINYLCHOLINE CHLORIDE 200 MG/10ML IV SOSY
PREFILLED_SYRINGE | INTRAVENOUS | Status: AC
  Filled 2018-04-04: qty 10

## 2018-04-04 MED ORDER — 0.9 % SODIUM CHLORIDE (POUR BTL) OPTIME
TOPICAL | Status: DC | PRN
  Administered 2018-04-04: 2000 mL

## 2018-04-04 MED ORDER — BUPIVACAINE HCL (PF) 0.5 % IJ SOLN
INTRAMUSCULAR | Status: AC
  Filled 2018-04-04: qty 30

## 2018-04-04 MED ORDER — DIPHENHYDRAMINE HCL 50 MG/ML IJ SOLN: 12.5000 mg | Freq: Four times a day (QID) | INTRAMUSCULAR | Status: DC | PRN

## 2018-04-04 MED ORDER — ONDANSETRON HCL 4 MG/2ML IJ SOLN
4.0000 mg | Freq: Four times a day (QID) | INTRAMUSCULAR | Status: DC | PRN
  Administered 2018-04-05: 4 mg via INTRAVENOUS
  Filled 2018-04-04: qty 2

## 2018-04-04 MED ORDER — BISACODYL 5 MG PO TBEC
10.0000 mg | DELAYED_RELEASE_TABLET | Freq: Every day | ORAL | Status: DC
  Administered 2018-04-05 – 2018-04-09 (×5): 10 mg via ORAL
  Filled 2018-04-04 (×5): qty 2

## 2018-04-04 MED ORDER — PROPOFOL 10 MG/ML IV BOLUS
INTRAVENOUS | Status: DC | PRN
  Administered 2018-04-04: 150 mg via INTRAVENOUS

## 2018-04-04 MED ORDER — DEXTROSE-NACL 5-0.45 % IV SOLN
INTRAVENOUS | Status: DC
  Administered 2018-04-05: via INTRAVENOUS

## 2018-04-04 MED ORDER — PANTOPRAZOLE SODIUM 40 MG PO TBEC
40.0000 mg | DELAYED_RELEASE_TABLET | Freq: Every day | ORAL | Status: DC
  Administered 2018-04-05 – 2018-04-09 (×5): 40 mg via ORAL
  Filled 2018-04-04 (×5): qty 1

## 2018-04-04 MED ORDER — SUGAMMADEX SODIUM 200 MG/2ML IV SOLN
INTRAVENOUS | Status: DC | PRN
  Administered 2018-04-04: 150 mg via INTRAVENOUS

## 2018-04-04 MED ORDER — ROCURONIUM BROMIDE 100 MG/10ML IV SOLN
INTRAVENOUS | Status: DC | PRN
  Administered 2018-04-04: 30 mg via INTRAVENOUS
  Administered 2018-04-04: 50 mg via INTRAVENOUS

## 2018-04-04 MED ORDER — POTASSIUM CHLORIDE 10 MEQ/50ML IV SOLN: 10.0000 meq | Freq: Every day | INTRAVENOUS | Status: DC | PRN

## 2018-04-04 MED ORDER — MIDAZOLAM HCL 2 MG/2ML IJ SOLN
INTRAMUSCULAR | Status: AC
  Filled 2018-04-04: qty 2

## 2018-04-04 MED ORDER — FENTANYL CITRATE (PF) 250 MCG/5ML IJ SOLN
INTRAMUSCULAR | Status: AC
  Filled 2018-04-04: qty 5

## 2018-04-04 MED ORDER — PROPOFOL 10 MG/ML IV BOLUS
INTRAVENOUS | Status: AC
  Filled 2018-04-04: qty 20

## 2018-04-04 MED ORDER — ONDANSETRON HCL 4 MG/2ML IJ SOLN
INTRAMUSCULAR | Status: AC
  Filled 2018-04-04: qty 2

## 2018-04-04 MED ORDER — LACTATED RINGERS IV SOLN
INTRAVENOUS | Status: DC | PRN
  Administered 2018-04-04: 08:00:00 via INTRAVENOUS

## 2018-04-04 MED ORDER — ENOXAPARIN SODIUM 40 MG/0.4ML ~~LOC~~ SOLN
40.0000 mg | SUBCUTANEOUS | Status: DC
  Administered 2018-04-05 – 2018-04-07 (×3): 40 mg via SUBCUTANEOUS
  Filled 2018-04-04 (×3): qty 0.4

## 2018-04-04 MED ORDER — FENTANYL 40 MCG/ML IV SOLN
INTRAVENOUS | Status: DC
  Administered 2018-04-04: 0 ug via INTRAVENOUS
  Administered 2018-04-04: 12:00:00 via INTRAVENOUS
  Administered 2018-04-04: 225 ug via INTRAVENOUS
  Administered 2018-04-05: 1000 ug via INTRAVENOUS
  Administered 2018-04-05: 165 ug via INTRAVENOUS
  Administered 2018-04-05: 45 ug via INTRAVENOUS
  Administered 2018-04-05: 165 ug via INTRAVENOUS
  Administered 2018-04-05: 45 ug via INTRAVENOUS
  Administered 2018-04-05: 135 ug via INTRAVENOUS
  Administered 2018-04-05: 60 ug via INTRAVENOUS
  Administered 2018-04-06: 30 ug via INTRAVENOUS
  Administered 2018-04-06: 15 ug via INTRAVENOUS
  Administered 2018-04-06: 135 ug via INTRAVENOUS
  Administered 2018-04-07: 30 ug via INTRAVENOUS
  Administered 2018-04-07: 105 ug via INTRAVENOUS
  Filled 2018-04-04 (×2): qty 1000

## 2018-04-04 MED ORDER — ACETAMINOPHEN 500 MG PO TABS
1000.0000 mg | ORAL_TABLET | Freq: Four times a day (QID) | ORAL | Status: DC
  Administered 2018-04-05 – 2018-04-09 (×17): 1000 mg via ORAL
  Filled 2018-04-04 (×17): qty 2

## 2018-04-04 MED ORDER — ONDANSETRON HCL 4 MG/2ML IJ SOLN
INTRAMUSCULAR | Status: DC | PRN
  Administered 2018-04-04: 4 mg via INTRAVENOUS

## 2018-04-04 MED ORDER — GABAPENTIN 600 MG PO TABS
600.0000 mg | ORAL_TABLET | Freq: Four times a day (QID) | ORAL | Status: DC
  Administered 2018-04-04 – 2018-04-09 (×19): 600 mg via ORAL
  Filled 2018-04-04 (×18): qty 1

## 2018-04-04 MED ORDER — EPHEDRINE SULFATE 50 MG/ML IJ SOLN
INTRAMUSCULAR | Status: DC | PRN
  Administered 2018-04-04 (×3): 5 mg via INTRAVENOUS

## 2018-04-04 MED ORDER — SENNOSIDES-DOCUSATE SODIUM 8.6-50 MG PO TABS
1.0000 | ORAL_TABLET | Freq: Every day | ORAL | Status: DC
  Administered 2018-04-04 – 2018-04-08 (×5): 1 via ORAL
  Filled 2018-04-04 (×5): qty 1

## 2018-04-04 MED ORDER — BUSPIRONE HCL 15 MG PO TABS
15.0000 mg | ORAL_TABLET | Freq: Two times a day (BID) | ORAL | Status: DC
  Administered 2018-04-04 – 2018-04-09 (×10): 15 mg via ORAL
  Filled 2018-04-04 (×10): qty 1

## 2018-04-04 MED ORDER — DEXAMETHASONE SODIUM PHOSPHATE 10 MG/ML IJ SOLN
INTRAMUSCULAR | Status: AC
  Filled 2018-04-04: qty 2

## 2018-04-04 MED ORDER — SODIUM CHLORIDE (PF) 0.9 % IJ SOLN
INTRAMUSCULAR | Status: DC | PRN
  Administered 2018-04-04: 50 mL

## 2018-04-04 MED ORDER — FENTANYL CITRATE (PF) 100 MCG/2ML IJ SOLN: 25.0000 ug | INTRAMUSCULAR | Status: DC | PRN

## 2018-04-04 MED ORDER — ALLOPURINOL 100 MG PO TABS
100.0000 mg | ORAL_TABLET | Freq: Every day | ORAL | Status: DC
  Administered 2018-04-05 – 2018-04-09 (×5): 100 mg via ORAL
  Filled 2018-04-04 (×5): qty 1

## 2018-04-04 MED ORDER — PHENYLEPHRINE HCL 10 MG/ML IJ SOLN
INTRAMUSCULAR | Status: DC | PRN
  Administered 2018-04-04: 20 ug/min via INTRAVENOUS

## 2018-04-04 MED ORDER — BUPIVACAINE LIPOSOME 1.3 % IJ SUSP
20.0000 mL | INTRAMUSCULAR | Status: AC
  Administered 2018-04-04: 20 mL
  Filled 2018-04-04: qty 20

## 2018-04-04 MED ORDER — LIDOCAINE HCL (CARDIAC) PF 100 MG/5ML IV SOSY
PREFILLED_SYRINGE | INTRAVENOUS | Status: DC | PRN
  Administered 2018-04-04: 60 mg via INTRAVENOUS

## 2018-04-04 MED ORDER — OXYCODONE HCL 5 MG PO TABS
5.0000 mg | ORAL_TABLET | ORAL | Status: DC | PRN
  Administered 2018-04-05 (×3): 10 mg via ORAL
  Administered 2018-04-06: 5 mg via ORAL
  Administered 2018-04-08 – 2018-04-09 (×2): 10 mg via ORAL
  Filled 2018-04-04 (×5): qty 2
  Filled 2018-04-04: qty 1

## 2018-04-04 MED ORDER — VENLAFAXINE HCL ER 75 MG PO CP24
75.0000 mg | ORAL_CAPSULE | Freq: Two times a day (BID) | ORAL | Status: DC
  Administered 2018-04-04 – 2018-04-09 (×10): 75 mg via ORAL
  Filled 2018-04-04 (×11): qty 1

## 2018-04-04 MED ORDER — LACTATED RINGERS IV SOLN
INTRAVENOUS | Status: DC | PRN
  Administered 2018-04-04: 07:00:00 via INTRAVENOUS

## 2018-04-04 MED ORDER — DEXAMETHASONE SODIUM PHOSPHATE 10 MG/ML IJ SOLN
INTRAMUSCULAR | Status: DC | PRN
  Administered 2018-04-04: 5 mg via INTRAVENOUS

## 2018-04-04 MED ORDER — CEFAZOLIN SODIUM-DEXTROSE 2-4 GM/100ML-% IV SOLN
2.0000 g | Freq: Three times a day (TID) | INTRAVENOUS | Status: AC
  Administered 2018-04-05: 2 g via INTRAVENOUS
  Filled 2018-04-04 (×3): qty 100

## 2018-04-04 MED ORDER — INSULIN ASPART 100 UNIT/ML ~~LOC~~ SOLN
0.0000 [IU] | SUBCUTANEOUS | Status: DC
  Administered 2018-04-04: 4 [IU] via SUBCUTANEOUS
  Administered 2018-04-05: 2 [IU] via SUBCUTANEOUS

## 2018-04-04 MED ORDER — ALBUTEROL SULFATE (2.5 MG/3ML) 0.083% IN NEBU: 2.5000 mg | INHALATION_SOLUTION | RESPIRATORY_TRACT | Status: DC | PRN

## 2018-04-04 MED ORDER — SODIUM CHLORIDE 0.9% FLUSH: 9.0000 mL | INTRAVENOUS | Status: DC | PRN

## 2018-04-04 SURGICAL SUPPLY — 104 items
ADH SKN CLS APL DERMABOND .7 (GAUZE/BANDAGES/DRESSINGS) ×2
APL SWBSTK 6 STRL LF DISP (MISCELLANEOUS) ×2
APPLICATOR COTTON TIP 6 STRL (MISCELLANEOUS) IMPLANT
APPLICATOR COTTON TIP 6IN STRL (MISCELLANEOUS) ×4
APPLIER CLIP ROT 10 11.4 M/L (STAPLE)
APR CLP MED LRG 11.4X10 (STAPLE)
BAG SPEC RTRVL LRG 6X4 10 (ENDOMECHANICALS)
CANISTER SUCT 3000ML PPV (MISCELLANEOUS) ×4 IMPLANT
CATH THORACIC 28FR (CATHETERS) IMPLANT
CATH THORACIC 28FR RT ANG (CATHETERS) IMPLANT
CATH THORACIC 36FR (CATHETERS) IMPLANT
CATH THORACIC 36FR RT ANG (CATHETERS) IMPLANT
CLIP APPLIE ROT 10 11.4 M/L (STAPLE) IMPLANT
CLIP VESOCCLUDE MED 6/CT (CLIP) ×6 IMPLANT
CONN ST 1/4X3/8  BEN (MISCELLANEOUS)
CONN ST 1/4X3/8 BEN (MISCELLANEOUS) IMPLANT
CONN Y 3/8X3/8X3/8  BEN (MISCELLANEOUS)
CONN Y 3/8X3/8X3/8 BEN (MISCELLANEOUS) IMPLANT
CONT SPEC 4OZ CLIKSEAL STRL BL (MISCELLANEOUS) ×40 IMPLANT
COVER SURGICAL LIGHT HANDLE (MISCELLANEOUS) IMPLANT
COVER WAND RF STERILE (DRAPES) ×4 IMPLANT
CUTTER ECHEON FLEX ENDO 45 340 (ENDOMECHANICALS) ×2 IMPLANT
DERMABOND ADVANCED (GAUZE/BANDAGES/DRESSINGS) ×2
DERMABOND ADVANCED .7 DNX12 (GAUZE/BANDAGES/DRESSINGS) IMPLANT
DRAIN CHANNEL 28F RND 3/8 FF (WOUND CARE) IMPLANT
DRAIN CHANNEL 32F RND 10.7 FF (WOUND CARE) IMPLANT
DRAPE CV SPLIT W-CLR ANES SCRN (DRAPES) ×4 IMPLANT
DRAPE ORTHO SPLIT 77X108 STRL (DRAPES) ×4
DRAPE SURG ORHT 6 SPLT 77X108 (DRAPES) ×2 IMPLANT
DRAPE WARM FLUID 44X44 (DRAPE) ×4 IMPLANT
ELECT BLADE 6.5 EXT (BLADE) ×4 IMPLANT
ELECT REM PT RETURN 9FT ADLT (ELECTROSURGICAL) ×4
ELECTRODE REM PT RTRN 9FT ADLT (ELECTROSURGICAL) ×2 IMPLANT
GAUZE SPONGE 4X4 12PLY STRL (GAUZE/BANDAGES/DRESSINGS) ×4 IMPLANT
GAUZE SPONGE 4X4 12PLY STRL LF (GAUZE/BANDAGES/DRESSINGS) ×2 IMPLANT
GLOVE BIO SURGEON STRL SZ 6.5 (GLOVE) ×1 IMPLANT
GLOVE BIO SURGEONS STRL SZ 6.5 (GLOVE) ×1
GLOVE BIOGEL PI IND STRL 6.5 (GLOVE) IMPLANT
GLOVE BIOGEL PI IND STRL 7.5 (GLOVE) IMPLANT
GLOVE BIOGEL PI INDICATOR 6.5 (GLOVE) ×2
GLOVE BIOGEL PI INDICATOR 7.5 (GLOVE) ×2
GLOVE SURG SIGNA 7.5 PF LTX (GLOVE) ×8 IMPLANT
GOWN STRL REUS W/ TWL LRG LVL3 (GOWN DISPOSABLE) ×4 IMPLANT
GOWN STRL REUS W/ TWL XL LVL3 (GOWN DISPOSABLE) ×2 IMPLANT
GOWN STRL REUS W/TWL LRG LVL3 (GOWN DISPOSABLE) ×8
GOWN STRL REUS W/TWL XL LVL3 (GOWN DISPOSABLE) ×4
HEMOSTAT SURGICEL 2X14 (HEMOSTASIS) IMPLANT
KIT BASIN OR (CUSTOM PROCEDURE TRAY) ×4 IMPLANT
KIT SUCTION CATH 14FR (SUCTIONS) IMPLANT
KIT TURNOVER KIT B (KITS) ×4 IMPLANT
NDL HYPO 25GX1X1/2 BEV (NEEDLE) ×2 IMPLANT
NDL SPNL 18GX3.5 QUINCKE PK (NEEDLE) ×2 IMPLANT
NEEDLE HYPO 25GX1X1/2 BEV (NEEDLE) ×4 IMPLANT
NEEDLE SPNL 18GX3.5 QUINCKE PK (NEEDLE) ×4 IMPLANT
NS IRRIG 1000ML POUR BTL (IV SOLUTION) ×12 IMPLANT
OIL SILICONE PENTAX (PARTS (SERVICE/REPAIRS)) ×2 IMPLANT
PACK CHEST (CUSTOM PROCEDURE TRAY) ×4 IMPLANT
PAD ARMBOARD 7.5X6 YLW CONV (MISCELLANEOUS) ×8 IMPLANT
POUCH ENDO CATCH II 15MM (MISCELLANEOUS) IMPLANT
POUCH SPECIMEN RETRIEVAL 10MM (ENDOMECHANICALS) IMPLANT
RELOAD STAPLE 35X2.5 WHT THIN (STAPLE) IMPLANT
RELOAD STAPLE 45 4.1 GRN THCK (STAPLE) IMPLANT
RELOAD STAPLE 45 GOLD REG/THCK (STAPLE) IMPLANT
SCISSORS ENDO CVD 5DCS (MISCELLANEOUS) IMPLANT
SEALANT PROGEL (MISCELLANEOUS) IMPLANT
SEALANT SURG COSEAL 4ML (VASCULAR PRODUCTS) IMPLANT
SEALANT SURG COSEAL 8ML (VASCULAR PRODUCTS) IMPLANT
SHEARS HARMONIC HDI 20CM (ELECTROSURGICAL) ×2 IMPLANT
SOLUTION ANTI FOG 6CC (MISCELLANEOUS) ×4 IMPLANT
SPECIMEN JAR MEDIUM (MISCELLANEOUS) IMPLANT
SPONGE INTESTINAL PEANUT (DISPOSABLE) ×2 IMPLANT
SPONGE TONSIL TAPE 1 RFD (DISPOSABLE) ×4 IMPLANT
STAPLE RELOAD 2.5MM WHITE (STAPLE) ×20 IMPLANT
STAPLE RELOAD 45 GRN (STAPLE) ×2 IMPLANT
STAPLE RELOAD 45MM GOLD (STAPLE) ×4 IMPLANT
STAPLE RELOAD 45MM GREEN (STAPLE) ×4
STAPLER VASCULAR ECHELON 35 (CUTTER) ×2 IMPLANT
SUT PROLENE 4 0 RB 1 (SUTURE)
SUT PROLENE 4-0 RB1 .5 CRCL 36 (SUTURE) IMPLANT
SUT SILK  1 MH (SUTURE) ×4
SUT SILK 1 MH (SUTURE) ×4 IMPLANT
SUT SILK 1 TIES 10X30 (SUTURE) ×4 IMPLANT
SUT SILK 2 0 SH (SUTURE) IMPLANT
SUT SILK 2 0SH CR/8 30 (SUTURE) IMPLANT
SUT SILK 3 0 SH 30 (SUTURE) IMPLANT
SUT SILK 3 0SH CR/8 30 (SUTURE) ×4 IMPLANT
SUT VIC AB 1 CTX 36 (SUTURE) ×4
SUT VIC AB 1 CTX36XBRD ANBCTR (SUTURE) ×2 IMPLANT
SUT VIC AB 2-0 CTX 36 (SUTURE) ×4 IMPLANT
SUT VIC AB 3-0 MH 27 (SUTURE) IMPLANT
SUT VIC AB 3-0 X1 27 (SUTURE) ×4 IMPLANT
SUT VICRYL 2 TP 1 (SUTURE) IMPLANT
SYR 10ML LL (SYRINGE) ×4 IMPLANT
SYR 20CC LL (SYRINGE) ×2 IMPLANT
SYR 30ML LL (SYRINGE) ×4 IMPLANT
SYSTEM SAHARA CHEST DRAIN ATS (WOUND CARE) ×4 IMPLANT
TAPE CLOTH 4X10 WHT NS (GAUZE/BANDAGES/DRESSINGS) ×4 IMPLANT
TAPE CLOTH SURG 4X10 WHT LF (GAUZE/BANDAGES/DRESSINGS) ×2 IMPLANT
TIP APPLICATOR SPRAY EXTEND 16 (VASCULAR PRODUCTS) IMPLANT
TOWEL GREEN STERILE (TOWEL DISPOSABLE) ×4 IMPLANT
TOWEL GREEN STERILE FF (TOWEL DISPOSABLE) ×4 IMPLANT
TRAY FOLEY MTR SLVR 16FR STAT (SET/KITS/TRAYS/PACK) ×4 IMPLANT
TROCAR XCEL BLADELESS 5X75MML (TROCAR) ×4 IMPLANT
WATER STERILE IRR 1000ML POUR (IV SOLUTION) ×4 IMPLANT

## 2018-04-04 NOTE — Anesthesia Postprocedure Evaluation (Signed)
Anesthesia Post Note  Patient: Maria Arroyo  Procedure(s) Performed: VIDEO ASSISTED THORACOSCOPY (VATS)/RIGHT LOWER LOBECTOMY (Right Chest) NODE DISSECTION (N/A Chest)     Patient location during evaluation: PACU Anesthesia Type: General Level of consciousness: awake Pain management: pain level controlled Vital Signs Assessment: post-procedure vital signs reviewed and stable Respiratory status: spontaneous breathing Cardiovascular status: stable Postop Assessment: no apparent nausea or vomiting Anesthetic complications: no    Last Vitals:  Vitals:   04/04/18 1200 04/04/18 1226  BP:  120/65  Pulse: (!) 52 (!) 49  Resp: 15 12  Temp:    SpO2: 96% 94%    Last Pain:  Vitals:   04/04/18 1200  TempSrc:   PainSc: Asleep                 Gery Sabedra

## 2018-04-04 NOTE — Interval H&P Note (Signed)
History and Physical Interval Note:  04/04/2018 7:29 AM  Maria Arroyo  has presented today for surgery, with the diagnosis of RLL NODULE  The various methods of treatment have been discussed with the patient and family. After consideration of risks, benefits and other options for treatment, the patient has consented to  Procedure(s): VIDEO ASSISTED THORACOSCOPY (VATS)/RIGHT LOWER LOBECTOMY (Right) as a surgical intervention .  The patient's history has been reviewed, patient examined, no change in status, stable for surgery.  I have reviewed the patient's chart and labs.  Questions were answered to the patient's satisfaction.     Melrose Nakayama

## 2018-04-04 NOTE — Anesthesia Procedure Notes (Signed)
Central Venous Catheter Insertion Performed by: Belinda Block, MD, anesthesiologist Start/End1/08/2018 6:50 AM, 04/04/2018 7:05 AM Patient location: OR. Preanesthetic checklist: patient identified, IV checked, site marked, risks and benefits discussed, surgical consent, monitors and equipment checked, pre-op evaluation and timeout performed Position: Trendelenburg Lidocaine 1% used for infiltration and patient sedated Hand hygiene performed , maximum sterile barriers used  and Seldinger technique used Central line was placed.Double lumen Procedure performed using ultrasound guided technique. Ultrasound Notes:image(s) printed for medical record Following insertion, line sutured, dressing applied and Biopatch. Patient tolerated the procedure well with no immediate complications.

## 2018-04-04 NOTE — Transfer of Care (Signed)
Immediate Anesthesia Transfer of Care Note  Patient: Maria Arroyo  Procedure(s) Performed: VIDEO ASSISTED THORACOSCOPY (VATS)/RIGHT LOWER LOBECTOMY (Right Chest) NODE DISSECTION (N/A Chest)  Patient Location: PACU  Anesthesia Type:General  Level of Consciousness: awake, alert , oriented and patient cooperative  Airway & Oxygen Therapy: Patient Spontanous Breathing and Patient connected to nasal cannula oxygen  Post-op Assessment: Report given to RN and Post -op Vital signs reviewed and stable  Post vital signs: Reviewed and stable  Last Vitals:  Vitals Value Taken Time  BP    Temp    Pulse 56 04/04/2018 10:26 AM  Resp    SpO2 98 % 04/04/2018 10:26 AM  Vitals shown include unvalidated device data.  Last Pain:  Vitals:   04/04/18 0552  TempSrc:   PainSc: 0-No pain      Patients Stated Pain Goal: 3 (46/95/07 2257)  Complications: No apparent anesthesia complications

## 2018-04-04 NOTE — Brief Op Note (Addendum)
04/04/2018  10:02 AM  PATIENT:  Maria Arroyo  68 y.o. female  PRE-OPERATIVE DIAGNOSIS:  RLL NODULE  POST-OPERATIVE DIAGNOSIS:  NON-SMALL CELL CARCINOMA - CLINICAL STAGE IA(T1N0)  PROCEDURE:   RIGHT VATS THORACOSCOPIC RIGHT LOWER LOBECTOMY MEDIASTINAL LYMPH NODE DISSECTION INTERCOSTAL NERVE BLOCKS- levels 3-9  SURGEON:  Surgeon(s) and Role:    * Melrose Nakayama, MD - Primary  PHYSICIAN ASSISTANT: Macarthur Critchley, PA-C  ANESTHESIA:   general  EBL:  100 mL   BLOOD ADMINISTERED:none  DRAINS: Right 75fr. pleural x 1   LOCAL MEDICATIONS USED:  MARCAINE     SPECIMEN: Right lower lung lobe  DISPOSITION OF SPECIMEN:  PATHOLOGY  COUNTS:  YES  DICTATION: .Dragon Dictation  PLAN OF CARE: Admit to inpatient   PATIENT DISPOSITION:  PACU - hemodynamically stable.   Delay start of Pharmacological VTE agent (>24hrs) due to surgical blood loss or risk of bleeding: no

## 2018-04-04 NOTE — Anesthesia Procedure Notes (Signed)
Procedure Name: Intubation Date/Time: 04/04/2018 7:55 AM Performed by: Shirlyn Goltz, CRNA Pre-anesthesia Checklist: Patient identified, Emergency Drugs available, Suction available and Patient being monitored Patient Re-evaluated:Patient Re-evaluated prior to induction Oxygen Delivery Method: Circle system utilized Preoxygenation: Pre-oxygenation with 100% oxygen Induction Type: IV induction Ventilation: Mask ventilation without difficulty Laryngoscope Size: Mac and 3 Grade View: Grade I Endobronchial tube: Left, Double lumen EBT, EBT position confirmed by auscultation and EBT position confirmed by fiberoptic bronchoscope and 37 Fr Number of attempts: 1 Placement Confirmation: ETT inserted through vocal cords under direct vision,  positive ETCO2 and breath sounds checked- equal and bilateral Tube secured with: Tape Dental Injury: Teeth and Oropharynx as per pre-operative assessment

## 2018-04-04 NOTE — Anesthesia Procedure Notes (Signed)
Arterial Line Insertion Start/End1/08/2018 6:50 AM, 04/04/2018 7:00 AM Performed by: Shirlyn Goltz, CRNA, CRNA  Patient location: Pre-op. Preanesthetic checklist: patient identified, IV checked, risks and benefits discussed, surgical consent, monitors and equipment checked and pre-op evaluation Lidocaine 1% used for infiltration and patient sedated Left, radial was placed Catheter size: 20 G Hand hygiene performed , maximum sterile barriers used  and Seldinger technique used Allen's test indicative of satisfactory collateral circulation Attempts: 1 Procedure performed without using ultrasound guided technique. Following insertion, dressing applied and Biopatch. Post procedure assessment: normal

## 2018-04-05 ENCOUNTER — Encounter (HOSPITAL_COMMUNITY): Payer: Self-pay | Admitting: Thoracic Surgery (Cardiothoracic Vascular Surgery)

## 2018-04-05 ENCOUNTER — Inpatient Hospital Stay (HOSPITAL_COMMUNITY): Payer: Medicare Other

## 2018-04-05 LAB — BASIC METABOLIC PANEL
ANION GAP: 8 (ref 5–15)
BUN: 18 mg/dL (ref 8–23)
CO2: 26 mmol/L (ref 22–32)
Calcium: 8.1 mg/dL — ABNORMAL LOW (ref 8.9–10.3)
Chloride: 105 mmol/L (ref 98–111)
Creatinine, Ser: 1.22 mg/dL — ABNORMAL HIGH (ref 0.44–1.00)
GFR calc Af Amer: 53 mL/min — ABNORMAL LOW (ref >60)
GFR calc non Af Amer: 46 mL/min — ABNORMAL LOW (ref >60)
Glucose, Bld: 146 mg/dL — ABNORMAL HIGH (ref 70–99)
POTASSIUM: 4 mmol/L (ref 3.5–5.1)
Sodium: 139 mmol/L (ref 135–145)

## 2018-04-05 LAB — GLUCOSE, CAPILLARY
Glucose-Capillary: 106 mg/dL — ABNORMAL HIGH (ref 70–99)
Glucose-Capillary: 145 mg/dL — ABNORMAL HIGH (ref 70–99)
Glucose-Capillary: 148 mg/dL — ABNORMAL HIGH (ref 70–99)
Glucose-Capillary: 150 mg/dL — ABNORMAL HIGH (ref 70–99)
Glucose-Capillary: 151 mg/dL — ABNORMAL HIGH (ref 70–99)

## 2018-04-05 LAB — BLOOD GAS, ARTERIAL
Acid-Base Excess: 2.4 mmol/L — ABNORMAL HIGH (ref 0.0–2.0)
Bicarbonate: 28 mmol/L (ref 20.0–28.0)
O2 Content: 2 L/min
O2 Saturation: 96 %
PH ART: 7.316 — AB (ref 7.350–7.450)
Patient temperature: 98.6
pCO2 arterial: 56.6 mmHg — ABNORMAL HIGH (ref 32.0–48.0)
pO2, Arterial: 87.5 mmHg (ref 83.0–108.0)

## 2018-04-05 LAB — CBC
HCT: 35 % — ABNORMAL LOW (ref 36.0–46.0)
Hemoglobin: 10.7 g/dL — ABNORMAL LOW (ref 12.0–15.0)
MCH: 29.4 pg (ref 26.0–34.0)
MCHC: 30.6 g/dL (ref 30.0–36.0)
MCV: 96.2 fL (ref 80.0–100.0)
Platelets: 243 10*3/uL (ref 150–400)
RBC: 3.64 MIL/uL — AB (ref 3.87–5.11)
RDW: 15.4 % (ref 11.5–15.5)
WBC: 12.9 10*3/uL — ABNORMAL HIGH (ref 4.0–10.5)
nRBC: 0 % (ref 0.0–0.2)

## 2018-04-05 MED ORDER — ALBUTEROL SULFATE (2.5 MG/3ML) 0.083% IN NEBU
2.5000 mg | INHALATION_SOLUTION | RESPIRATORY_TRACT | Status: DC
  Administered 2018-04-05: 2.5 mg via RESPIRATORY_TRACT
  Filled 2018-04-05: qty 3

## 2018-04-05 MED ORDER — ORAL CARE MOUTH RINSE
15.0000 mL | Freq: Two times a day (BID) | OROMUCOSAL | Status: DC
  Administered 2018-04-05 – 2018-04-07 (×3): 15 mL via OROMUCOSAL

## 2018-04-05 MED ORDER — ALBUTEROL SULFATE (2.5 MG/3ML) 0.083% IN NEBU: 2.5000 mg | INHALATION_SOLUTION | RESPIRATORY_TRACT | Status: DC

## 2018-04-05 MED ORDER — LOSARTAN POTASSIUM 25 MG PO TABS
25.0000 mg | ORAL_TABLET | Freq: Every day | ORAL | Status: DC
  Administered 2018-04-05 – 2018-04-09 (×5): 25 mg via ORAL
  Filled 2018-04-05 (×5): qty 1

## 2018-04-05 MED ORDER — CLOPIDOGREL BISULFATE 75 MG PO TABS
75.0000 mg | ORAL_TABLET | Freq: Every day | ORAL | Status: DC
  Administered 2018-04-05 – 2018-04-07 (×3): 75 mg via ORAL
  Filled 2018-04-05 (×3): qty 1

## 2018-04-05 MED ORDER — INFLUENZA VAC SPLIT HIGH-DOSE 0.5 ML IM SUSY
0.5000 mL | PREFILLED_SYRINGE | INTRAMUSCULAR | Status: DC
  Filled 2018-04-05: qty 0.5

## 2018-04-05 MED ORDER — ALBUTEROL SULFATE (2.5 MG/3ML) 0.083% IN NEBU
2.5000 mg | INHALATION_SOLUTION | Freq: Three times a day (TID) | RESPIRATORY_TRACT | Status: DC
  Administered 2018-04-05 – 2018-04-07 (×8): 2.5 mg via RESPIRATORY_TRACT
  Filled 2018-04-05 (×8): qty 3

## 2018-04-05 MED ORDER — ENSURE ENLIVE PO LIQD
237.0000 mL | Freq: Two times a day (BID) | ORAL | Status: DC
  Administered 2018-04-05 – 2018-04-06 (×2): 237 mL via ORAL

## 2018-04-05 MED ORDER — ADULT MULTIVITAMIN W/MINERALS CH
1.0000 | ORAL_TABLET | Freq: Every day | ORAL | Status: DC
  Administered 2018-04-06 – 2018-04-09 (×4): 1 via ORAL
  Filled 2018-04-05 (×4): qty 1

## 2018-04-05 NOTE — Progress Notes (Addendum)
      [address]       [place] [address]             [phone]      1 Day Post-Op Procedure(s) (LRB): VIDEO ASSISTED THORACOSCOPY (VATS)/RIGHT LOWER LOBECTOMY (Right) NODE DISSECTION (N/A)   Subjective:  Patient is doing okay.  She does complain of pain, which is inhibiting her from taking deep breath.  She denies N/V  Objective: Vital signs in last 24 hours: Temp:  [97.3 F (36.3 C)-98.1 F (36.7 C)] 98 F (36.7 C) (01/07 0707) Pulse Rate:  [48-56] 50 (01/07 0707) Cardiac Rhythm: Sinus bradycardia (01/07 0700) Resp:  [11-19] 14 (01/07 0707) BP: (106-143)/(54-68) 123/59 (01/07 0707) SpO2:  [90 %-99 %] 94 % (01/07 0707) Arterial Line BP: (93-160)/(48-66) 146/57 (01/06 1900)  Intake/Output from previous day: 01/06 0701 - 01/07 0700 In: 1859.4 [I.V.:1259.4] Out: 1060 [Urine:450; Blood:100; Chest Tube:510]  General appearance: alert, cooperative and no distress Heart: regular rate and rhythm Lungs: diminished breath sounds bilaterally Abdomen: soft, non-tender; bowel sounds normal; no masses,  no organomegaly Wound: clean and dry  Lab Results: Recent Labs    04/05/18 0424  WBC 12.9*  HGB 10.7*  HCT 35.0*  PLT 243   BMET:  Recent Labs    04/05/18 0424  NA 139  K 4.0  CL 105  CO2 26  GLUCOSE 146*  BUN 18  CREATININE 1.22*  CALCIUM 8.1*    PT/INR: No results for input(s): LABPROT, INR in the last 72 hours. ABG    Component Value Date/Time   PHART 7.316 (L) 04/05/2018 0422   HCO3 28.0 04/05/2018 0422   O2SAT 96.0 04/05/2018 0422   CBG (last 3)  Recent Labs    04/04/18 2359 04/05/18 0400 04/05/18 0741  GLUCAP 106* 148* 151*    Assessment/Plan: S/P Procedure(s) (LRB): VIDEO ASSISTED THORACOSCOPY (VATS)/RIGHT LOWER LOBECTOMY (Right) NODE DISSECTION (N/A)  1. CV-Sinus Bradycardia, BP is elevated- will restart home Cozaar at reduced dose, restart Plavix 2. Pulm- severe COPD, poor respiratory effort, will start Albuterol q4,  nebs prn, wean oxygen as tolerated 3. Chest tube- 650 serous output since surgery, no air leak present, will place to water seal, CXR remains pending 4. D/C Arterial line 5. IV fluid to KVO 6. Lovenox for DVT prophylaxis 7. CBGs okay, patient is not diabetic, will d/c SSIP 8. Dispo- patient needs to make more effort with her breathing, albuterol q4, nebs prn, review CXR once complete, start home Cozaar for better BP control, restart Plavix, continue current care   LOS: 1 day    Ellwood Handler 04/05/2018 Patient seen and examined, agree with above Hypercarbic- push IS, add scheduled albuterol Ambulate   Remo Lipps C. Roxan Hockey, MD [facility] [phone]

## 2018-04-05 NOTE — Progress Notes (Addendum)
Initial Nutrition Assessment  DOCUMENTATION CODES:   Obesity unspecified  INTERVENTION:   -Ensure Enlive po BID, each supplement provides 350 kcal and 20 grams of protein -MVI with minerals daily   NUTRITION DIAGNOSIS:   Increased nutrient needs related to post-op healing as evidenced by estimated needs.  GOAL:   Patient will meet greater than or equal to 90% of their needs  MONITOR:   PO intake, Supplement acceptance, Weight trends, Labs, Skin, I & O's  REASON FOR ASSESSMENT:   Malnutrition Screening Tool    ASSESSMENT:   Maria Arroyo is a 68 year old woman with a long history of tobacco abuse (2 packs/day for greater than 35 years), COPD, coronary artery disease, four previous coronary stents, hypertension, hyperlipidemia, osteoarthritis, cervical disc disease, chronic back pain, neuropathy, gout, and depression.  She recently saw Dr. Melford Arroyo.  She had been feeling poorly.  She mentioned that she had a 25 pound weight loss over 3 months.  She complained of early satiety and also occasional nausea.  He did a work-up for an occult malignancy including a chest x-ray which showed a question of a right lower lobe lung nodule.  She was referred to Dr. Melvyn Arroyo.  He recommended a PET CT.  That showed the mass was markedly hypermetabolic with an SUV of 11.  She had some healing rib fractures on the right side from a fall a few months prior.  There was no evidence of regional or distant metastases.  Pt admitted with RLL mass.   1/6- Procedure(s):VIDEO ASSISTED THORACOSCOPY (VATS)/RIGHT LOWER LOBECTOMY (Right)  Chest tube output: 510 ml x 24 hours  Reviewed I/O's: +799 ml x 24 hours  Spoke with pt, who was sitting in recliner chair at time of visit. Pt consumed about 1/3 of chicken, 1/3 of dinner roll, and none of her asparagus. Pt shares she has experienced a decreased appetite secondary to nausea and early satiety over the past 6 months. Pt shares that she was placed on metformin  around this time and experienced GI sides effects (nausea and diarrhea), which is a common side effect with this medication. PTA pt was consuming 2 meals per day (Brunch of eggs and toast and dinner of sandwich). Pt reports that her meal completion here has been about the same as home.   Pt reports UBW is around 180#. She estimates she has experienced a 20# wt loss over the past 6 months. Per CareEverywhere, pt wt was 79.8 kg on 09/01/17, which demonstrates a 4.5% wt loss over the past 7 months, which is not significant for time frame.   Pt shares that she has been feeling more weak over the past 1-2 months. She shares that she tripped over a vacuum cord and hit her right side of her chest on her bed frame, which she feels resulted in rib fractures.   Discussed with pt importance of good meal and supplement intake to promote healing. She is amenable to Ensure supplements.   Discussed DM control at home. Per pt, she does not check her CBGS at home and she discontinued her Metformin about a month ago. She shares her last Hg A1c was in the 5 range.   Pt with poor oral intake and would benefit from nutrient dense supplement. One Ensure Enlive supplement provides 350 kcals, 20 grams protein, and 44-45 grams of carbohydrate vs one Glucerna shake supplement, which provides 220 kcals, 10 grams of protein, and 26 grams of carbohydrate. Given pt's hx of DM, RD will continue to monitor  PO intake, CBGS, and adjust supplement regimen as appropriate.   Labs reviewed: CBGS: 145-151.   NUTRITION - FOCUSED PHYSICAL EXAM:    Most Recent Value  Orbital Region  No depletion  Upper Arm Region  Mild depletion  Thoracic and Lumbar Region  No depletion  Buccal Region  No depletion  Temple Region  No depletion  Clavicle Bone Region  No depletion  Clavicle and Acromion Bone Region  No depletion  Scapular Bone Region  No depletion  Dorsal Hand  No depletion  Patellar Region  No depletion  Anterior Thigh Region  No  depletion  Posterior Calf Region  No depletion  Edema (RD Assessment)  Mild  Hair  Reviewed  Eyes  Reviewed  Mouth  Reviewed  Skin  Reviewed  Nails  Reviewed       Diet Order:   Diet Order            Diet regular Room service appropriate? Yes; Fluid consistency: Thin  Diet effective now              EDUCATION NEEDS:   Education needs have been addressed  Skin:  Skin Assessment: Skin Integrity Issues: Skin Integrity Issues:: Other (Comment) Other: rt chest tube  Last BM:  PTA  Height:   Ht Readings from Last 1 Encounters:  04/04/18 5\' 1"  (1.549 m)    Weight:   Wt Readings from Last 1 Encounters:  04/04/18 76.2 kg    Ideal Body Weight:  47.7 kg  BMI:  Body mass index is 31.74 kg/m.  Estimated Nutritional Needs:   Kcal:  1650-1850  Protein:  80-95 grams  Fluid:  >1.6 L    Maria Arroyo A. Jimmye Norman, RD, LDN, CDE Pager: [phone] After hours Pager: [phone]

## 2018-04-05 NOTE — Discharge Summary (Addendum)
Physician Discharge Summary  Patient ID: Maria Arroyo MRN: [id number] DOB/AGE: Aug 02, 1950 68 y.o.  Admit date: 04/04/2018 Discharge date: 04/09/2018  Admission Diagnoses: Right lower lobe lung nodule suspected lung cancer, clinical stage IA(T1N0)  Patient Active Problem List   Diagnosis Date Noted  . Solitary pulmonary nodule 02/04/2018  . Neuropathy 03/18/2017  . Disturbance of skin sensation 03/10/2013  . Cervical spondylosis with myelopathy 03/10/2013  . Abnormality of gait 03/10/2013  . Blood glucose elevated 11/18/2012  . Arthritis 02/02/2011  . Chronic pain 02/02/2011  . Fibrositis 02/02/2011  . Coronary artery disease   . COPD GOLD II if use FEV1/VC ratio    . Essential hypertension   . Hyperlipidemia   . Cigarette smoker   . 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 S/P lobectomy of lung 04/04/2018  . Solitary pulmonary nodule 02/04/2018  . Neuropathy 03/18/2017  . Disturbance of skin sensation 03/10/2013  . Cervical spondylosis with myelopathy 03/10/2013  . Abnormality of gait 03/10/2013  . Blood glucose elevated 11/18/2012  . Arthritis 02/02/2011  . Chronic pain 02/02/2011  . Fibrositis 02/02/2011  . Coronary artery disease   . COPD GOLD II if use FEV1/VC ratio    . Essential hypertension   . Hyperlipidemia   . Cigarette smoker   . Depression    Discharged Condition: good  History of Present Illness:  Maria Arroyo is a 68 yo white female with known history of tobacco abuse (2 packs/day for greater than 35 years), COPD, coronary artery disease, four previous coronary stents, hypertension, hyperlipidemia, osteoarthritis, cervical disc disease, chronic back pain, neuropathy, gout, and depression. Maria Arroyo recently presented to Dr. Melford Aase with complaints of feeling poorly overall.  Maria Arroyo had experienced a 25 lb weight loss over the past several months.   Maria Arroyo was experiencing nausea and an early sensation of being full.  He performed a workup of malignancy and Maria Arroyo was found to have a right lower lung nodule on CXR.  Maria Arroyo was subsequently referred to Dr. Melvyn Novas who recommended a PET CT scan.  This showed the mass to be markedly hypermetabolic.  There was no evidence of distant metastasis.  Maria Arroyo was referred to [facility] for surgical evaluation by Dr. Roxan Hockey.  At that time Maria Arroyo stated Maria Arroyo had not experienced any more weight loss and her appetite had improved some.  Maria Arroyo gets short of breath while ambulating up an incline.  Maria Arroyo denies cough and hemoptysis.  He recommended surgical resection for definitive diagnosis.  The risks and benefits of the procedure were explained to the patient and Maria Arroyo was agreeable to proceed.  Hospital Course:   Maria Arroyo presented to [facility] on 04/04/2018.  Maria Arroyo was taken to the operating room and underwent Right VATS with Right Lower lobectomy and lymph node dissection.  Maria Arroyo tolerated the procedure without difficulty, was extubated and taken to the PACU in stable condition.  Her chest tube was free of air leak and was transitioned to water seal on POD #1.  Maria Arroyo was restarted on her home regimen of Cozaar for Hypertension.  Maria Arroyo was restarted on her home regimen of Plavix.  Her chest tube was free from air leak.  Her CXR was free from significant pneumothorax.  Her chest tube was removed on 04/06/2018.  Follow up CXR showed no evidence of pneumothorax.  There was evidence of mild pulmonary edema.  Maria Arroyo developed Atrial Fibrillation  post chest tube removal.  Maria Arroyo was started on IV amiodarone and cardiology consult was obtained.  They were in agreement with Amiodarone and they restarted patients home BB regimen. Maria Arroyo converted to NSR and her Amiodarone drip was discontinued.  Unfortunately, Maria Arroyo converted back into Atrial fibrillation and Amiodarone therapy was resumed.  Maria Arroyo converted to NSR and IV amiodarone was transitioned to an oral  regimen.  Maria Arroyo was started on Eliquis to decrease her risk of stroke. Maria Arroyo is now on Amiodarone 400mg  BID for two weeks as recommended by Dr. Burt Knack. Maria Arroyo will be seen outpatient by cardiology and they will titrate this medication if needed due to her bradycardia. Today, Maria Arroyo is ambulating with limited assistance, tolerating 2L [place] when ambulating, her incisions are healing well, and Maria Arroyo is ready for discharge home. Her chest tube sutures will be removed in the office during her follow-up appointment.     Significant Diagnostic Studies: nuclear medicine:   1. Hypermetabolic right lower lobe pulmonary nodule, consistent with primary bronchogenic carcinoma. Presuming non-small-cell histology, T1cN0M0 or stage IA. 2. Left inguinal hypermetabolic lymph node, favored to be reactive. 3. Coronary artery atherosclerosis. Aortic Atherosclerosis (ICD10-I70.0). 4. Pulmonary artery enlargement suggests pulmonary arterial hypertension. 5. Healing posttraumatic lower right rib fractures.  Treatments: surgery:    Right video-assisted thoracoscopy, thoracoscopic right lower lobectomy, mediastinal lymph node dissection, intercostal nerve blocks at levels 3 through 9.  Discharge Exam: Blood pressure 135/72, pulse (!) 57, temperature 98 F (36.7 C), temperature source Oral, resp. rate 19, height 5\' 1"  (1.549 m), weight 76.2 kg, SpO2 99 %.    General appearance: alert, cooperative and no distress Heart: sinus brady Lungs: clear to auscultation bilaterally Abdomen: soft, non-tender; bowel sounds normal; no masses,  no organomegaly Extremities: extremities normal, atraumatic, no cyanosis or edema Wound: clean and dry   Disposition: Home  Discharge Medications:   Allergies as of 04/09/2018      Reactions   Ace Inhibitors Other (See Comments)   Worsening renal function   Codeine Nausea Only   Codeine-guaifenesin [guaifenesin-codeine] Nausea Only, Other (See Comments)   Some nausea--but ok with percocet       Medication List    STOP taking these medications   carisoprodol 350 MG tablet Commonly known as:  SOMA   clopidogrel 75 MG tablet Commonly known as:  PLAVIX   oxyCODONE-acetaminophen 10-325 MG tablet Commonly known as:  PERCOCET   torsemide 20 MG tablet Commonly known as:  DEMADEX     TAKE these medications   acetaminophen 500 MG tablet Commonly known as:  TYLENOL Take 2 tablets (1,000 mg total) by mouth every 6 (six) hours.   allopurinol 100 MG tablet Commonly known as:  ZYLOPRIM Take 100 mg by mouth daily.   amiodarone 200 MG tablet Commonly known as:  PACERONE Take 2 tabs (400 mg)  twice daily for 14 days, then 1 tab (200 mg) daily thereafter.   apixaban 5 MG Tabs tablet Commonly known as:  ELIQUIS Take 1 tablet (5 mg total) by mouth 2 (two) times daily.   aspirin 81 MG tablet Take 81 mg by mouth daily.   bisoprolol 5 MG tablet Commonly known as:  ZEBETA Take 1 tablet (5 mg total) by mouth daily.   busPIRone 15 MG tablet Commonly known as:  BUSPAR Take 15 mg by mouth 2 (two) times daily.   CALCIUM 600 + D PO Take 1 tablet by mouth daily.   gabapentin 600 MG tablet Commonly known as:  NEURONTIN Take 1  tablet (600 mg total) by mouth 4 (four) times daily. At 8am 1pm 6pm and 10pm   losartan 25 MG tablet Commonly known as:  COZAAR Take 1 tablet (25 mg total) by mouth daily. Start taking on:  April 10, 2018 What changed:    medication strength  how much to take   multivitamin tablet Take 1 tablet by mouth daily.   nitroGLYCERIN 0.4 MG SL tablet Commonly known as:  NITROSTAT Place 1 tablet (0.4 mg total) under the tongue every 5 (five) minutes as needed. For chest pain What changed:    reasons to take this  additional instructions   oxyCODONE 5 MG immediate release tablet Commonly known as:  Oxy IR/ROXICODONE Take 1 tablet (5 mg total) by mouth every 6 (six) hours as needed for severe pain.   PROVENTIL HFA 108 (90 Base) MCG/ACT  inhaler Generic drug:  albuterol Inhale 1-2 puffs into the lungs every 4 (four) hours as needed for wheezing or shortness of breath.   rosuvastatin 40 MG tablet Commonly known as:  CRESTOR Take 1 tablet (40 mg total) by mouth daily. NEED OV.   venlafaxine XR 75 MG 24 hr capsule Commonly known as:  EFFEXOR-XR Take 75 mg by mouth 2 (two) times daily.   Vitamin D 50 MCG (2000 UT) tablet Take 4,000 Units by mouth daily.      Follow-up Information    Melrose Nakayama, MD Follow up on 04/20/2018.   Specialty:  Cardiothoracic Surgery Why:  Appoointment is at 3:30, please get CXR at 3:00 at [facility] located on first floor of our office building Contact information: [address] [place] [address] [phone]        [name], MD. Call in 1 day(s).   Specialty:  Family Medicine Contact information: [place] [address] [phone]        Martinique, Peter M, MD .   Specialty:  Cardiology Contact information: [address] [place] [address] [phone]           Signed: Elgie Collard 04/09/2018, 11:28 AM

## 2018-04-05 NOTE — Progress Notes (Signed)
2 mL of fentanyl PCA wasted in sink with [employer] fast streamer

## 2018-04-06 ENCOUNTER — Inpatient Hospital Stay (HOSPITAL_COMMUNITY): Payer: Medicare Other

## 2018-04-06 ENCOUNTER — Other Ambulatory Visit: Payer: Self-pay

## 2018-04-06 DIAGNOSIS — I4891 Unspecified atrial fibrillation: Secondary | ICD-10-CM

## 2018-04-06 LAB — CBC
HCT: 36.3 % (ref 36.0–46.0)
HEMOGLOBIN: 11.1 g/dL — AB (ref 12.0–15.0)
MCH: 29.7 pg (ref 26.0–34.0)
MCHC: 30.6 g/dL (ref 30.0–36.0)
MCV: 97.1 fL (ref 80.0–100.0)
Platelets: 227 10*3/uL (ref 150–400)
RBC: 3.74 MIL/uL — ABNORMAL LOW (ref 3.87–5.11)
RDW: 15.4 % (ref 11.5–15.5)
WBC: 13 10*3/uL — ABNORMAL HIGH (ref 4.0–10.5)
nRBC: 0 % (ref 0.0–0.2)

## 2018-04-06 LAB — COMPREHENSIVE METABOLIC PANEL
ALT: 9 U/L (ref 0–44)
AST: 11 U/L — ABNORMAL LOW (ref 15–41)
Albumin: 2.5 g/dL — ABNORMAL LOW (ref 3.5–5.0)
Alkaline Phosphatase: 79 U/L (ref 38–126)
Anion gap: 8 (ref 5–15)
BILIRUBIN TOTAL: 0.4 mg/dL (ref 0.3–1.2)
BUN: 17 mg/dL (ref 8–23)
CO2: 27 mmol/L (ref 22–32)
Calcium: 8.4 mg/dL — ABNORMAL LOW (ref 8.9–10.3)
Chloride: 102 mmol/L (ref 98–111)
Creatinine, Ser: 1.23 mg/dL — ABNORMAL HIGH (ref 0.44–1.00)
GFR calc Af Amer: 53 mL/min — ABNORMAL LOW (ref >60)
GFR calc non Af Amer: 45 mL/min — ABNORMAL LOW (ref >60)
Glucose, Bld: 140 mg/dL — ABNORMAL HIGH (ref 70–99)
Potassium: 4.1 mmol/L (ref 3.5–5.1)
Sodium: 137 mmol/L (ref 135–145)
Total Protein: 5.8 g/dL — ABNORMAL LOW (ref 6.5–8.1)

## 2018-04-06 MED ORDER — AMIODARONE HCL IN DEXTROSE 360-4.14 MG/200ML-% IV SOLN
30.0000 mg/h | INTRAVENOUS | Status: DC
  Administered 2018-04-06 (×2): 30 mg/h via INTRAVENOUS
  Filled 2018-04-06: qty 200

## 2018-04-06 MED ORDER — AMIODARONE HCL IN DEXTROSE 360-4.14 MG/200ML-% IV SOLN
INTRAVENOUS | Status: AC
  Administered 2018-04-06: 60 mg/h via INTRAVENOUS
  Filled 2018-04-06: qty 200

## 2018-04-06 MED ORDER — AMIODARONE HCL IN DEXTROSE 360-4.14 MG/200ML-% IV SOLN
60.0000 mg/h | INTRAVENOUS | Status: AC
  Administered 2018-04-06: 60 mg/h via INTRAVENOUS
  Filled 2018-04-06: qty 200

## 2018-04-06 NOTE — Op Note (Signed)
NAME: Maria Arroyo, Maria Arroyo MEDICAL RECORD [id number] ACCOUNT [id number] DATE OF BIRTH:24-Feb-1951 FACILITY: [facility] LOCATION: [facility] PHYSICIAN:Yue Glasheen Chaya Jan, MD  OPERATIVE REPORT  DATE OF PROCEDURE:  04/04/2018  PREOPERATIVE DIAGNOSIS:  Right lower lobe lung nodule.- Suspected non-small cell carcinoma- T1,N0  POSTOPERATIVE DIAGNOSIS:  Nonsmall-cell carcinoma, clinical stage IA (T1, N0).  PROCEDURE:   Right video-assisted thoracoscopy, Thoracoscopic right lower lobectomy, Mediastinal lymph node dissection, Intercostal nerve blocks- levels 3 through 9.  SURGEON:  Modesto Charon, MD  ASSISTANT:  Enid Cutter, PA  ANESTHESIA:  General.  FINDINGS:  Mass in the posterior aspect of right lower lobe.  Frozen section showed nonsmall-cell carcinoma.  Bronchial margin free of tumor.  CLINICAL NOTE:  The patient is a 68 year old woman with a long history of tobacco abuse and COPD.  She recently has been feeling poorly with weight loss.  A chest x-ray showed a possible right lower lobe lung nodule.  A PET CT showed a 2.5 cm right lower  lobe nodule that was hypermetabolic with an SUV of 11. This was a suspected T1N0 non-small cell carcinoma. There was no evidence of regional or distant metastatic disease.  She was advised to undergo right lower lobectomy for definitive diagnosis and treatment.  The indications, risks, benefits, and  alternatives were discussed in detail with the patient.  She understood and accepted the risks and agreed to proceed.  OPERATIVE NOTE:  Maria Arroyo was brought to the preoperative holding area on 04/04/2018.  Anesthesia placed a central line and an arterial blood pressure monitoring line.  She was taken to the operating room, anesthetized and intubated with a double-lumen  endotracheal tube.  Intravenous antibiotics were administered.  Foley catheter was placed.  Sequential compression devices were placed on the calves for DVT prophylaxis.  She was  placed in a left lateral decubitus position, and the right chest was  prepped and draped in the usual sterile fashion.  Single-lung ventilation of the left lung was initiated and was tolerated well throughout the procedure.  A solution containing 20 mL of liposomal bupivacaine, 30 mL of 0.5% bupivacaine, and 50 mL of saline was prepared.  This was used for the nerve blocks as well as injected directly at the incisions for local.  A timeout was performed.  The bupivacaine  solution was injected in the 7th interspace in the midaxillary line.  A port-type incision was made, and a 5 mm port was placed into the chest.  The thoracoscope was advanced into the chest.  There was good isolation of the right lung.  There was no  pleural effusion.  The area for the working incision then was anesthetized with the bupivacaine solution, and a 4 cm working incision was made.  No rib spreading was performed during the procedure.  The fissures were noted to be relatively complete.  The  inferior ligament was divided with the Harmonic scalpel.  A level 9 lymph node was encountered.  It was removed and sent to pathology as a separate specimen for permanent pathology, as were all lymph nodes that were removed during the procedure.  The  pleural reflection was divided at the hilum posteriorly, and level 7 nodes were removed as well.  The pleural reflection was divided at the hilum anteriorly.  The small portion of the fissure between the middle and lower lobes that was not complete was  divided with an Echelon stapler using a 45 mm stapler with gold cartridges.  The inferior pulmonary vein then was dissected out, encircled and  divided with the vascular stapler.  Additional lymph nodes were dissected out from around the pulmonary artery  branches.  There were 2 superior segmental branches in addition to the basilar trunk of the artery.  Initially, attempt was made to dissect out the superior segmental and basilar branches in  order to divide them separately, but as the dissection  proceeded, it was clear that it would be easier to isolate the entire pulmonary artery.  There was a small portion of the fissure posteriorly between the upper lobe and the superior segment that was not complete.  This was completed with the Echelon  stapler.  The vascular stapler then was advanced across the pulmonary artery, dividing the basilar trunk and the superior segmental branches at the same time.  The right lower lobe bronchus now was fully exposed.  The Echelon stapler with a green  cartridge was placed across the lower lobe bronchus at its origin and closed.  A test inflation showed good aeration of the upper and middle lobes.  The stapler was fired, transecting the bronchus.  The specimen was placed into an endoscopic retrieval  bag, removed and sent for frozen section on the mass and the bronchial margin that returned showing nonsmall-cell carcinoma.  The bronchial margin was free of tumor.  The pleura then was incised, and level 4 nodes were dissected out from under the azygos  vein.  The chest was copiously irrigated with warm saline.  A test inflation to 30 cm of water revealed no leakage from the bronchial stump.  There was good hemostasis with no ongoing bleeding.  A 28-French chest tube was placed through the original  port incision and secured with a #1 silk suture.  The upper and middle lobes were reinflated.  The working incision was closed in standard fashion.  Dermabond was applied.  The chest tube was placed to suction.  The patient was placed back in the supine  position.  She then was extubated in the operating room and taken to the postanesthetic care unit in good condition.  All sponge, needle and instrument counts were correct at the end of the procedure.  LN/NUANCE  D:04/06/2018 T:04/06/2018 JOB:[id number]

## 2018-04-06 NOTE — Progress Notes (Signed)
Notified by CCMD of patient's HR 120-140s.  Upon assessment, patient sleeping.  Patient then had several extended runs of SVT and V-Tach that were able to be captured on a 12 lead EKG.  Strips from CCMD are saved to the chart.  Dr. Cyndia Bent notified and amiodarone gtt initiated per verbal order.  Will continue to monitor.

## 2018-04-06 NOTE — Progress Notes (Signed)
2 Days Post-Op Procedure(s) (LRB): VIDEO ASSISTED THORACOSCOPY (VATS)/RIGHT LOWER LOBECTOMY (Right) NODE DISSECTION (N/A) Subjective: Resting supine in bed, awakens easily. No new complaints. Says pain control is adequate with the PCA. Up to chair yesterday but did not ambulate in hall.   Objective: Vital signs in last 24 hours: Temp:  [97.5 F (36.4 C)-99 F (37.2 C)] 98.4 F (36.9 C) (01/08 0303) Pulse Rate:  [52-65] 65 (01/08 0303) Cardiac Rhythm: Other (Comment) (01/08 0700) Resp:  [12-20] 14 (01/08 0303) BP: (100-155)/(52-83) 100/53 (01/08 0600) SpO2:  [90 %-97 %] 90 % (01/08 0303)    Intake/Output from previous day: 01/07 0701 - 01/08 0700 In: 1310.3 [P.O.:720; I.V.:540.3] Out: 1590 [Urine:1350; Chest Tube:240] Intake/Output this shift: No intake/output data recorded.  General appearance: alert, cooperative and no distress Heart: irregularly irregular rhythm Lungs: clear to auscultation bilaterally Abdomen: soft, non-tender; bowel sounds normal; no masses,  no organomegaly Wound: Dressings clean and dry.  Chest tube is secure.  No air leak. CT drainage 80ml over past 12 hours.  Lab Results: Recent Labs    04/05/18 0424 04/06/18 0305  WBC 12.9* 13.0*  HGB 10.7* 11.1*  HCT 35.0* 36.3  PLT 243 227   BMET:  Recent Labs    04/05/18 0424 04/06/18 0305  NA 139 137  K 4.0 4.1  CL 105 102  CO2 26 27  GLUCOSE 146* 140*  BUN 18 17  CREATININE 1.22* 1.23*  CALCIUM 8.1* 8.4*    PT/INR: No results for input(s): LABPROT, INR in the last 72 hours. ABG    Component Value Date/Time   PHART 7.316 (L) 04/05/2018 0422   HCO3 28.0 04/05/2018 0422   O2SAT 96.0 04/05/2018 0422   CBG (last 3)  Recent Labs    04/05/18 0741 04/05/18 1212 04/05/18 1639  GLUCAP 151* 145* 150*    Assessment/Plan: S/P Procedure(s) (LRB): VIDEO ASSISTED THORACOSCOPY (VATS)/RIGHT LOWER LOBECTOMY (Right) NODE DISSECTION (N/A)  POD2 VATS Right lower lobectomy for suspected lung  cancer. Path pending. Stable respiratory status but still requiring supplemental O2 to maintain adequate SAO2.  Continue albuterol MDI's and work on mobility / pulmonary hygiene.  Expect we can remove CT later today.  D/C foley catheter.   Atrial Fibrillation:  IV amiodarone load initiated. Leave central line while amiodarone infusing.  HTN: BP improved after Cozaar resumed  CAD:  S/P coronary PTCI x 4. Plavix, ASA, and statin resumed  DVT PPX: continue Lovenox.  History of depression:  Effexor and Buspar resumed   LOS: 2 days    Maria Odea, PA-C 04/06/2018

## 2018-04-06 NOTE — Consult Note (Addendum)
Cardiology Consultation:   Patient ID: CAILEN TEXEIRA MRN: [id number]; DOB: 03/13/1951  Admit date: 04/04/2018 Date of Consult: 04/06/2018  Primary Care Provider: [name], MD Primary Cardiologist: Peter Martinique, MD  Primary Electrophysiologist:  None    Patient Profile:   TAKIMA ENCINA is a 68 y.o. female with a hx of CAD with stent to mRCA 02/1998, pRCA stent in 2002, mRCA stent 2011, cath 2013 was patent, + tobacco use,   HLD, COPD, HTN,  who is being seen today for the evaluation of a fib RVR with VATS Rt lower lobectomy at the request of Dr. Roxan Hockey.  [name]  History of Present Illness:   Ms. Lizama with prior hx of CAD with extensive stenting to RCA last cath with nonobstructive disease within the stents in 2013  EF was 55-65%.  She has been doing well from cardiology perspective.  She has had 25 lb wt loss after starting metformin.  In workup a CT in Nov found RLL nodule.  PET scan with hypermetabolic RLL pulmonary nodule consistent with primary bronchogenic carcinoma.  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 She was seen by Dr. Roxan Hockey and plans for surgery which was done 04/04/18.  Her plavix was held prior to procedure.   She has done well post op, today around 4 A HR was up to 120-140s  While sleeping.   IV amiodarone has been started.      Na 137, K+ 4.1, Cr 1.23, alb 2.5, Hgb 11.1, hct 36.3    EKG SR with freq PACs and non specific ST abnormalities.    Currently sitting in bed without awareness of HR.  No chest pain or SOB.  Feels well.  HR continues 110 but mostly 120s     Past Medical History:  Diagnosis Date  . Cervical disc disease   . COPD (chronic obstructive pulmonary disease) ([place])   . Coronary artery disease   . Depression   . Gout    R foot and knee  . Hyperlipidemia   . Hypertension   . Neuropathy   . OA (osteoarthritis)   . Tobacco abuse     Past Surgical History:  Procedure Laterality Date  . ABDOMINAL HYSTERECTOMY    . ANTERIOR  CERVICAL DECOMP/DISCECTOMY FUSION  02/06/2011   Procedure: ANTERIOR CERVICAL DECOMPRESSION/DISCECTOMY FUSION 2 LEVELS;  Surgeon: Winfield Cunas;  Location: [facility];  Service: Neurosurgery;  Laterality: N/A;  Anterior Cervical Four-Five/Five-Six Decompression and Fusion with Plating and Bonegraft  . BLADDER SURGERY    . CARDIAC CATHETERIZATION  04/09/2009   EF 60%  . CARDIAC CATHETERIZATION  03/24/2001   EF 55%  . CARDIAC CATHETERIZATION  01/06/2001   EF 65%  . CHOLECYSTECTOMY    . CORONARY STENT PLACEMENT  03/2009   STENTING OF THE PROXIMAL TO MID RIGHT CORONARY  . LEFT HEART CATHETERIZATION WITH CORONARY ANGIOGRAM N/A 12/21/2011   Procedure: LEFT HEART CATHETERIZATION WITH CORONARY ANGIOGRAM;  Surgeon: Peter M Martinique, MD;  Location: [facility] CATH LAB;  Service: Cardiovascular;  Laterality: N/A;  . NODE DISSECTION N/A 04/04/2018   Procedure: NODE DISSECTION;  Surgeon: Melrose Nakayama, MD;  Location: [facility];  Service: Thoracic;  Laterality: N/A;  . OTHER SURGICAL HISTORY  12/2014   R foot cyst removal  . VIDEO ASSISTED THORACOSCOPY (VATS)/ LOBECTOMY Right 04/04/2018   Procedure: VIDEO ASSISTED THORACOSCOPY (VATS)/RIGHT LOWER LOBECTOMY;  Surgeon: Melrose Nakayama, MD;  Location: [facility];  Service: Thoracic;  Laterality: Right;     Home Medications:  Prior to Admission medications   Medication Sig Start Date End Date Taking? Authorizing Provider  allopurinol (ZYLOPRIM) 100 MG tablet Take 100 mg by mouth daily.   Yes [provider]  aspirin 81 MG tablet Take 81 mg by mouth daily.   Yes [provider]  bisoprolol (ZEBETA) 5 MG tablet Take 1 tablet (5 mg total) by mouth daily. 02/04/18  Yes Tanda Rockers, MD  busPIRone (BUSPAR) 15 MG tablet Take 15 mg by mouth 2 (two) times daily.   Yes [provider]  Calcium Carbonate-Vitamin D (CALCIUM 600 + D PO) Take 1 tablet by mouth daily.    Yes [provider]  carisoprodol (SOMA) 350 MG tablet Take 350 mg by  mouth 4 (four) times daily as needed for muscle spasms.    Yes [provider]  Cholecalciferol (VITAMIN D) 2000 UNITS tablet Take 4,000 Units by mouth daily.    Yes [provider]  gabapentin (NEURONTIN) 600 MG tablet Take 1 tablet (600 mg total) by mouth 4 (four) times daily. At 8am 1pm 6pm and 10pm 03/17/18  Yes Dennie Bible, NP  losartan (COZAAR) 50 MG tablet Take 50 mg by mouth daily.  03/25/16  Yes [provider]  Multiple Vitamin (MULTIVITAMIN) tablet Take 1 tablet by mouth daily.    Yes [provider]  oxyCODONE-acetaminophen (PERCOCET) 10-325 MG tablet Take 1 tablet by mouth 4 (four) times daily as needed for pain.  03/03/16  Yes [provider]  PROVENTIL HFA 108 (90 Base) MCG/ACT inhaler Inhale 1-2 puffs into the lungs every 4 (four) hours as needed for wheezing or shortness of breath.  03/26/16  Yes [provider]  rosuvastatin (CRESTOR) 40 MG tablet Take 1 tablet (40 mg total) by mouth daily. NEED OV. 01/26/18 04/26/18 Yes Martinique, Peter M, MD  torsemide (DEMADEX) 20 MG tablet Take 20 mg by mouth daily.     Yes [provider]  venlafaxine XR (EFFEXOR-XR) 75 MG 24 hr capsule Take 75 mg by mouth 2 (two) times daily.    Yes [provider]  clopidogrel (PLAVIX) 75 MG tablet Take 1 tablet (75 mg total) by mouth daily. NEED OV. 02/18/18   Martinique, Peter M, MD  nitroGLYCERIN (NITROSTAT) 0.4 MG SL tablet Place 1 tablet (0.4 mg total) under the tongue every 5 (five) minutes as needed. For chest pain Patient taking differently: Place 0.4 mg under the tongue every 5 (five) minutes as needed for chest pain.  08/03/16   Martinique, Peter M, MD    Inpatient Medications: Scheduled Meds: . acetaminophen  1,000 mg Oral Q6H   Or  . acetaminophen (TYLENOL) oral liquid 160 mg/5 mL  1,000 mg Oral Q6H  . albuterol  2.5 mg Nebulization TID  . allopurinol  100 mg Oral Daily  . aspirin EC  81 mg Oral Daily  . bisacodyl  10 mg  Oral Daily  . busPIRone  15 mg Oral BID  . clopidogrel  75 mg Oral Daily  . enoxaparin (LOVENOX) injection  40 mg Subcutaneous Q24H  . feeding supplement (ENSURE ENLIVE)  237 mL Oral BID BM  . fentaNYL   Intravenous Q4H  . gabapentin  600 mg Oral QID  . [START ON 04/07/2018] Influenza vac split quadrivalent PF  0.5 mL Intramuscular Tomorrow-1000  . losartan  25 mg Oral Daily  . mouth rinse  15 mL Mouth Rinse BID  . multivitamin with minerals  1 tablet Oral Daily  . pantoprazole  40  mg Oral Daily  . rosuvastatin  40 mg Oral Daily  . senna-docusate  1 tablet Oral QHS  . venlafaxine XR  75 mg Oral BID   Continuous Infusions: . amiodarone 30 mg/hr (04/06/18 0920)  . dextrose 5 % and 0.45% NaCl 10 mL/hr at 04/06/18 0400  . potassium chloride     PRN Meds: albuterol, diphenhydrAMINE **OR** diphenhydrAMINE, naloxone **AND** sodium chloride flush, ondansetron (ZOFRAN) IV, oxyCODONE, potassium chloride  Allergies:    Allergies  Allergen Reactions  . Ace Inhibitors Other (See Comments)    Worsening renal function   . Codeine Nausea Only  . Codeine-Guaifenesin [Guaifenesin-Codeine] Nausea Only and Other (See Comments)    Some nausea--but ok with percocet    Social History:   Social History   Socioeconomic History  . Marital status: Married    Spouse name: Not on file  . Number of children: 2  . Years of education: 10  . Highest education level: Not on file  Occupational History  . Occupation: housewife    Employer: UNEMPLOYED  Social Needs  . Financial resource strain: Not on file  . Food insecurity:    Worry: Not on file    Inability: Not on file  . Transportation needs:    Medical: Not on file    Non-medical: Not on file  Tobacco Use  . Smoking status: Current Every Day Smoker    Packs/day: 2.00    Years: 35.00    Pack years: 70.00    Types: Cigarettes  . Smokeless tobacco: Never Used  . Tobacco comment: 1-1.5 ppd (10/02/13), 03/17/18 2 PPD  Substance and Sexual  Activity  . Alcohol use: No    Alcohol/week: 0.0 standard drinks  . Drug use: No  . Sexual activity: Not on file  Lifestyle  . Physical activity:    Days per week: Not on file    Minutes per session: Not on file  . Stress: Not on file  Relationships  . Social connections:    Talks on phone: Not on file    Gets together: Not on file    Attends religious service: Not on file    Active member of club or organization: Not on file    Attends meetings of clubs or organizations: Not on file    Relationship status: Not on file  . Intimate partner violence:    Fear of current or ex partner: Not on file    Emotionally abused: Not on file    Physically abused: Not on file    Forced sexual activity: Not on file  Other Topics Concern  . 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   The patient has a 10th grade education.   The patient is left-handed.   The patient has two children.   Patient drinks three cups of soda daily.        Family History:    Family History  Problem Relation Age of Onset  . Aneurysm Mother   . Heart attack Father   . Heart failure Father   . Stroke Sister   . Heart attack Brother      ROS:  Please see the history of present illness.  General:no colds or fevers, + weight changes Skin:no rashes or ulcers HEENT:no blurred vision, no congestion CV:see HPI PUL:see HPI GI:no diarrhea constipation or melena, no indigestion GU:no hematuria, no dysuria MS:no joint pain, no claudication Neuro:no syncope, no lightheadedness Endo:no  diabetes, no thyroid disease  All other ROS reviewed and negative.     Physical Exam/Data:   Vitals:   04/06/18 0600 04/06/18 0758 04/06/18 0800 04/06/18 1200  BP: (!) 100/53 116/66    Pulse:  76    Resp:  14 14 16   Temp:  98.3 F (36.8 C)    TempSrc:  Oral    SpO2:  91% 92% 92%  Weight:      Height:        Intake/Output Summary (Last 24 hours) at 04/06/2018  1446 Last data filed at 04/06/2018 0924 Gross per 24 hour  Intake 1070.32 ml  Output 1815 ml  Net -744.68 ml   Filed Weights   04/04/18 0542  Weight: 76.2 kg   Body mass index is 31.74 kg/m.  General:  Well nourished, well developed, in no acute distress, unaware of irreg HR HEENT: normal Lymph: no adenopathy Neck: no JVD Endocrine:  No thryomegaly Vascular: No carotid bruits; pedal pulses 2+ bilaterally  Cardiac:  irreg irreg; no murmur gallup rub or click Lungs:  Clear to diminished to auscultation bilaterally, no wheezing, rhonchi or rales  Abd: soft, nontender, no hepatomegaly  Ext: no edema Musculoskeletal:  No deformities, BUE and BLE strength normal and equal Skin: warm and dry  Neuro:  CNs 2-12 intact, no focal abnormalities noted Psych:  Normal affect    Relevant CV Studies: Cath 11/2011 Procedural Findings: Hemodynamics: AO 156/75 with a mean of 103 mmHg LV 162/26 mmHg  Coronary angiography: Coronary dominance: right  Left mainstem: Normal.  Left anterior descending (LAD): Minor irregularities in the mid vessel up to 20%.  There is a moderately large ramus intermediate branch which is normal.  Left circumflex (LCx): Minor irregularities less than 10%. The left circumflex gives rise to 2 marginal branches.  Right coronary artery (RCA): The right coronary has extensive stents from the proximal to mid vessel. The proximal vessel is widely patent area to in the mid vessel there is diffuse 50% disease of the more distal stent. There is 60% narrowing at the distal stent margin.  Left ventriculography: Left ventricular systolic function is normal, LVEF is estimated at 55-65%, there is no significant mitral regurgitation   Final Conclusions:    1. Nonobstructive coronary disease. There is modest disease throughout the mid RCA that is unchanged compared to prior cardiac catheterization in 2011. The remainder of the stents are widely patent. 2. Normal left  ventricular function.   Laboratory Data:  Chemistry Recent Labs  Lab 03/31/18 0941 04/05/18 0424 04/06/18 0305  NA 140 139 137  K 4.3 4.0 4.1  CL 106 105 102  CO2 22 26 27   GLUCOSE 128* 146* 140*  BUN 30* 18 17  CREATININE 1.45* 1.22* 1.23*  CALCIUM 8.5* 8.1* 8.4*  GFRNONAA 37* 46* 45*  GFRAA 43* 53* 53*  ANIONGAP 12 8 8     Recent Labs  Lab 03/31/18 0941 04/06/18 0305  PROT 6.5 5.8*  ALBUMIN 3.2* 2.5*  AST 17 11*  ALT 22 9  ALKPHOS 101 79  BILITOT 0.3 0.4   Hematology Recent Labs  Lab 03/31/18 0941 04/05/18 0424 04/06/18 0305  WBC 10.2 12.9* 13.0*  RBC 4.01 3.64* 3.74*  HGB 11.9* 10.7* 11.1*  HCT 37.8 35.0* 36.3  MCV 94.3 96.2 97.1  MCH 29.7 29.4 29.7  MCHC 31.5 30.6 30.6  RDW 15.0 15.4 15.4  PLT 271 243 227   Cardiac EnzymesNo results for input(s): TROPONINI in the last 168 hours. No results  for input(s): TROPIPOC in the last 168 hours.  BNPNo results for input(s): BNP, PROBNP in the last 168 hours.  DDimer No results for input(s): DDIMER in the last 168 hours.  Radiology/Studies:  Dg Chest 2 View  Result Date: 04/04/2018 CLINICAL DATA:  Right lower lung nodule. EXAM: CHEST - 2 VIEW COMPARISON:  CT 02/14/2018 FINDINGS: Mild cardiomegaly. Aortic atherosclerosis. The left lung is clear. 4 cm mass in posterior inferior right lower lobe, consistent with the known hypermetabolic lesion on previous PET CT. On that CT, it measured only about 2.5 cm in diameter. No effusion. No other pulmonary finding. Ordinary degenerative changes affect the spine IMPRESSION: 1. 4 cm mass in the posterior inferior right lower lobe, consistent with the known hypermetabolic lesion on previous PET CT. 2. Mild cardiomegaly. 3. Aortic atherosclerosis. Electronically Signed   By: Nelson Chimes M.D.   On: 04/04/2018 06:30   Dg Chest Port 1 View  Result Date: 04/06/2018 CLINICAL DATA:  Postoperative lobectomy with chest tube present EXAM: PORTABLE CHEST 1 VIEW COMPARISON:  April 05, 2018  FINDINGS: Central catheter tip is in the superior vena cava near the cavoatrial junction, stable. Chest tube position on the right is stable. The medial basilar pneumothorax on the right is much smaller compared to 1 day prior. There is extensive subcutaneous air on the right, stable. There is atelectatic change in the left lower lobe. There is also mild atelectasis in the right base. Lungs elsewhere appear clear. Heart is upper normal in size with pulmonary vascularity normal. No adenopathy. Postoperative changes noted in the lower cervical spine. There are foci of carotid artery calcification bilaterally. IMPRESSION: Tube and catheter positions as described. Rather minimal residual inferobasilar pneumothorax medially on the right. No new focus of pneumothorax evident. Extensive subcutaneous air remains on the right. Atelectatic change left base noted. Earliest changes of pneumonia in this area can not be excluded. There is mild right base atelectasis. There is stable cardiomegaly. There are foci of carotid artery calcification bilaterally. Electronically Signed   By: Lowella Grip III M.D.   On: 04/06/2018 07:09   Dg Chest Port 1 View  Addendum Date: 04/05/2018   ADDENDUM REPORT: 04/05/2018 10:07 ADDENDUM: There is faint lucency at the right lung base suggesting a small basilar pneumothorax with a right-sided chest tube in satisfactory position. Electronically Signed   By: Kathreen Devoid   On: 04/05/2018 10:07   Result Date: 04/05/2018 CLINICAL DATA:  Status post lobectomy, right VATS EXAM: PORTABLE CHEST 1 VIEW COMPARISON:  04/04/2018 FINDINGS: Interval right lower lobectomy. Right-sided chest tube in satisfactory position. No pneumothorax. Left lung is clear. Stable cardiomediastinal silhouette. Right IJ central venous catheter with the tip projecting over the cavoatrial junction. No acute osseous abnormality. IMPRESSION: Interval right lower lobectomy. Right-sided chest tube in satisfactory position. No  pneumothorax. Electronically Signed: By: Kathreen Devoid On: 04/04/2018 11:32   Dg Chest Port 1 View  Result Date: 04/05/2018 CLINICAL DATA:  Status post lobectomy.  Chest tube. EXAM: PORTABLE CHEST 1 VIEW COMPARISON:  04/04/2018. FINDINGS: Mediastinum hilar structures normal. Stable cardiomegaly. Right IJ line and right chest tube in stable position. Right lower lobectomy. Right base pneumothorax. Mild left base subsegmental atelectasis. No pleural effusion or pneumothorax. Right chest wall subcutaneous emphysema. No pleural effusion or pneumothorax. IMPRESSION: 1.  Lines and tubes including right chest tube in stable position. 2. Right lower lobectomy. Right base pneumothorax noted. Diffuse right chest wall subcutaneous emphysema noted on today's exam. Critical Value/emergent results were called  by telephone at the time of interpretation on 04/05/2018 at 8:10 am to nurse Alyse Low, who verbally acknowledged these results. Electronically Signed   By: Marcello Moores  Register   On: 04/05/2018 08:12    Assessment and Plan:   1. A fib on amiodarone rate still to 120s with talking, other wise 110s, was on bisoprolol as outpt but with soft BP difficult to add.  Check echo.  CHA2DS2VASc of 4.  Anticoagulation per Dr. Roxan Hockey, Eliquis if agreeable.  At that time stop either ASA or Plavix will defer to Dr. Burt Knack.   2.   Post op RL Lobectomy with VATS- stable, chest tube still in place   3.   HTN  BP soft 88/73 to 111/69 --on cozaar  4.   CAD with stents to RCA, plavix was on hold, now resumed with ASA-- see above note.    5.    HLD on crestor 40 mg continue  For questions or updates, please contact [place] Please consult [email or web address] for contact info under   Signed, Cecilie Kicks, NP  04/06/2018 2:46 PM   Patient seen, examined. Available data reviewed. Agree with findings, assessment, and plan as outlined by Cecilie Kicks, NP. On my exam, the patient is alert, oriented, sitting up on the bedside. Neck:  JVP normal, lungs clear, right chest tube in place, heart irregularly irregular with 2/6 systolic murmur, abdomen soft, NT, ext: no edema.   Tele reviewed and shows AF with RVR on amiodarone currently with HR about 110 bpm at rest. Pt s/p lobectomy with chest tube in place. Hold anticoagulation for now but would consider starting her on an oral anticoagulant once she is further out from surgery. Continue IV amiodarone. BP too soft to increase beta blocker or add diltiazem at present. Echo ordered - most recent study showed normal LV function and no significant valvular disease. Anticipate stopping plavix, especially if she requires oral anticoagulant. Will follow.   Sherren Mocha, M.D. 04/06/2018 6:29 PM

## 2018-04-07 ENCOUNTER — Inpatient Hospital Stay (HOSPITAL_COMMUNITY): Payer: Medicare Other

## 2018-04-07 DIAGNOSIS — I370 Nonrheumatic pulmonary valve stenosis: Secondary | ICD-10-CM

## 2018-04-07 DIAGNOSIS — I361 Nonrheumatic tricuspid (valve) insufficiency: Secondary | ICD-10-CM

## 2018-04-07 LAB — BASIC METABOLIC PANEL
Anion gap: 8 (ref 5–15)
BUN: 19 mg/dL (ref 8–23)
CHLORIDE: 98 mmol/L (ref 98–111)
CO2: 27 mmol/L (ref 22–32)
Calcium: 8.4 mg/dL — ABNORMAL LOW (ref 8.9–10.3)
Creatinine, Ser: 1.26 mg/dL — ABNORMAL HIGH (ref 0.44–1.00)
GFR calc Af Amer: 51 mL/min — ABNORMAL LOW (ref >60)
GFR, EST NON AFRICAN AMERICAN: 44 mL/min — AB (ref >60)
Glucose, Bld: 129 mg/dL — ABNORMAL HIGH (ref 70–99)
Potassium: 4.1 mmol/L (ref 3.5–5.1)
Sodium: 133 mmol/L — ABNORMAL LOW (ref 135–145)

## 2018-04-07 LAB — ECHOCARDIOGRAM COMPLETE
Height: 61 in
Weight: 2688 oz

## 2018-04-07 MED ORDER — AMIODARONE HCL IN DEXTROSE 360-4.14 MG/200ML-% IV SOLN
60.0000 mg/h | INTRAVENOUS | Status: AC
  Administered 2018-04-07: 60 mg/h via INTRAVENOUS
  Filled 2018-04-07: qty 200

## 2018-04-07 MED ORDER — BISOPROLOL FUMARATE 5 MG PO TABS
5.0000 mg | ORAL_TABLET | Freq: Every day | ORAL | Status: DC
  Administered 2018-04-07 – 2018-04-09 (×2): 5 mg via ORAL
  Filled 2018-04-07 (×3): qty 1

## 2018-04-07 MED ORDER — METOPROLOL TARTRATE 5 MG/5ML IV SOLN: 2.5000 mg | INTRAVENOUS | Status: DC | PRN

## 2018-04-07 MED ORDER — AMIODARONE HCL IN DEXTROSE 360-4.14 MG/200ML-% IV SOLN
30.0000 mg/h | INTRAVENOUS | Status: AC
  Administered 2018-04-07 (×2): 30 mg/h via INTRAVENOUS

## 2018-04-07 NOTE — Progress Notes (Signed)
Dr Debara Pickett returned paged, to continue amio gtt. Will reassess in am. And start po ami in am

## 2018-04-07 NOTE — Progress Notes (Signed)
Patient converted from Afib to SB.  Amio gtt held d/t HR 50s.  Will continue to monitor.

## 2018-04-07 NOTE — Progress Notes (Addendum)
Progress Note  Patient Name: Maria Arroyo Date of Encounter: 04/07/2018  Primary Cardiologist: Peter Martinique, MD   Subjective   Converted to sinus yesterday however went into Afib RVR with abberancy (seems aflutter) after chest tube removal. No chest pain. Breathing stable.   Inpatient Medications    Scheduled Meds: . acetaminophen  1,000 mg Oral Q6H   Or  . acetaminophen (TYLENOL) oral liquid 160 mg/5 mL  1,000 mg Oral Q6H  . albuterol  2.5 mg Nebulization TID  . allopurinol  100 mg Oral Daily  . aspirin EC  81 mg Oral Daily  . bisacodyl  10 mg Oral Daily  . busPIRone  15 mg Oral BID  . clopidogrel  75 mg Oral Daily  . enoxaparin (LOVENOX) injection  40 mg Subcutaneous Q24H  . feeding supplement (ENSURE ENLIVE)  237 mL Oral BID BM  . gabapentin  600 mg Oral QID  . Influenza vac split quadrivalent PF  0.5 mL Intramuscular Tomorrow-1000  . losartan  25 mg Oral Daily  . mouth rinse  15 mL Mouth Rinse BID  . multivitamin with minerals  1 tablet Oral Daily  . pantoprazole  40 mg Oral Daily  . rosuvastatin  40 mg Oral Daily  . senna-docusate  1 tablet Oral QHS  . venlafaxine XR  75 mg Oral BID   Continuous Infusions: . amiodarone 60 mg/hr (04/07/18 1159)  . amiodarone    . dextrose 5 % and 0.45% NaCl Stopped (04/06/18 0856)  . potassium chloride     PRN Meds: albuterol, oxyCODONE, potassium chloride   Vital Signs    Vitals:   04/07/18 0340 04/07/18 0700 04/07/18 0729 04/07/18 1127  BP: 116/72 120/89  (!) 144/82  Pulse: 68 73  (!) 115  Resp: 18 17  (!) 21  Temp: 97.6 F (36.4 C) (!) 101.3 F (38.5 C)  99.7 F (37.6 C)  TempSrc: Oral Oral  Oral  SpO2: 96% 99% 94% 96%  Weight:      Height:        Intake/Output Summary (Last 24 hours) at 04/07/2018 1251 Last data filed at 04/07/2018 1139 Gross per 24 hour  Intake 794.36 ml  Output 430 ml  Net 364.36 ml   Filed Weights   04/04/18 0542  Weight: 76.2 kg    Telemetry    afib with some P wave  intermittently at 120s, seems aflutter with variable  - Personally Reviewed  ECG    Aflutter at RVR - Personally Reviewed  Physical Exam   GEN: No acute distress.   Neck: No JVD Cardiac: Irregular tachycardic, no murmurs, rubs, or gallops.  Respiratory: diminished breath sound GI: Soft, nontender, non-distended  MS: No edema; No deformity. Neuro:  Nonfocal  Psych: Normal affect   Labs    Chemistry Recent Labs  Lab 04/05/18 0424 04/06/18 0305 04/07/18 0500  NA 139 137 133*  K 4.0 4.1 4.1  CL 105 102 98  CO2 26 27 27   GLUCOSE 146* 140* 129*  BUN 18 17 19   CREATININE 1.22* 1.23* 1.26*  CALCIUM 8.1* 8.4* 8.4*  PROT  --  5.8*  --   ALBUMIN  --  2.5*  --   AST  --  11*  --   ALT  --  9  --   ALKPHOS  --  79  --   BILITOT  --  0.4  --   GFRNONAA 46* 45* 44*  GFRAA 53* 53* 51*  ANIONGAP 8 8 8  Hematology Recent Labs  Lab 04/05/18 0424 04/06/18 0305  WBC 12.9* 13.0*  RBC 3.64* 3.74*  HGB 10.7* 11.1*  HCT 35.0* 36.3  MCV 96.2 97.1  MCH 29.4 29.7  MCHC 30.6 30.6  RDW 15.4 15.4  PLT 243 227    Radiology    Dg Chest Port 1 View  Result Date: 04/07/2018 CLINICAL DATA:  Prior lung surgery. EXAM: PORTABLE CHEST 1 VIEW COMPARISON:  04/06/2018. FINDINGS: Right IJ line and right chest tube in stable position. No pneumothorax noted on today's exam. No prominent pleural effusion. Postsurgical changes right lung. Mild bibasilar atelectasis/infiltrates again noted. Stable cardiomegaly. Right chest wall subcutaneous emphysema again noted. Prior cervical spine fusion. IMPRESSION: 1. Right IJ line and right chest tube in stable position. No pneumothorax on today's exam. Right chest wall subcutaneous emphysema again noted. 2. Postsurgical changes right lung. Bibasilar atelectasis again noted. Electronically Signed   By: Marcello Moores  Register   On: 04/07/2018 06:51   Dg Chest Port 1 View  Result Date: 04/06/2018 CLINICAL DATA:  Postoperative lobectomy with chest tube present EXAM:  PORTABLE CHEST 1 VIEW COMPARISON:  April 05, 2018 FINDINGS: Central catheter tip is in the superior vena cava near the cavoatrial junction, stable. Chest tube position on the right is stable. The medial basilar pneumothorax on the right is much smaller compared to 1 day prior. There is extensive subcutaneous air on the right, stable. There is atelectatic change in the left lower lobe. There is also mild atelectasis in the right base. Lungs elsewhere appear clear. Heart is upper normal in size with pulmonary vascularity normal. No adenopathy. Postoperative changes noted in the lower cervical spine. There are foci of carotid artery calcification bilaterally. IMPRESSION: Tube and catheter positions as described. Rather minimal residual inferobasilar pneumothorax medially on the right. No new focus of pneumothorax evident. Extensive subcutaneous air remains on the right. Atelectatic change left base noted. Earliest changes of pneumonia in this area can not be excluded. There is mild right base atelectasis. There is stable cardiomegaly. There are foci of carotid artery calcification bilaterally. Electronically Signed   By: Lowella Grip III M.D.   On: 04/06/2018 07:09    Cardiac Studies   Pending echocardiogram   Patient Profile     Maria Arroyo is a 68 y.o. female with a hx of CAD with stent to [facility] 02/1998, pRCA stent in 2002, mRCA stent 2011, cath 2013 was patent, + tobacco use,   HLD, COPD, HTN,  who is being seen  for the evaluation of a fib RVR with VATS Rt lower lobectomy at the request of Dr. Roxan Hockey.   Assessment & Plan    1.  Atrial fibrillation/flutter with rapid ventricular rate intermittently -She was converted to sinus rhythm on IV amiodarone yesterday and then held.  This morning she had a recurrent tachycardia which seems like atrial flutter with aberrancy/variable rate at 150s.  Restarted amiodarone with improved rate to 120s now.  CHA2DS2VASc of 4.  Plan to start  anticoagulation when further out of surgery.  Blood pressure is stable now.  Will resume home dose of bisoprolol 5 mg  Daily.  Transition on IV amiodarone to p.o. when rate stable.  2.  Hypertension -Blood pressure stable now.  Continue losartan.  Add beta-blocker as above.  3.  CAD -Now back on dual antiplatelet therapy with aspirin and Plavix.  Likely discontinuation of aspirin when addition of anticoagulation.  4.  Post op RL Lobectomy with VATS  For questions or updates,  please contact [place] Please consult [email or web address] for contact info under     SignedLeanor Kail, PA  04/07/2018, 12:51 PM    Patient seen, examined. Available data reviewed. Agree with findings, assessment, and plan as outlined by Robbie Lis, PA.  The patient is independently interviewed and examined.  She is awake and alert, in no distress.  Lung fields are clear, JVP is normal, heart is irregularly irregular and tachycardic, abdomen is soft and nontender, extremities show no edema.  The patient has gone back into atrial fibrillation today after converting to sinus rhythm yesterday.  IV amiodarone has been restarted.  Hopefully she will convert back to sinus rhythm on IV amiodarone and we can continue loading her orally once she converts back to sinus.  Will discuss switching her from clopidogrel to an oral anticoagulant drug such as apixaban tomorrow.  The patient's chest tube was just pulled this morning.  Sherren Mocha, M.D. 04/07/2018 2:14 PM

## 2018-04-07 NOTE — Care Management Important Message (Signed)
Important Message  Patient Details  Name: Maria Arroyo MRN: [id number] Date of Birth: 12/29/1950   Medicare Important Message Given:  Yes    Harshita Bernales P Dhiren Azimi 04/07/2018, 4:22 PM

## 2018-04-07 NOTE — Progress Notes (Signed)
Pt up ambulating in halls with 2l/min oxygen nasal cannula. HR up to 130's. Paged myron PA, to call cards for orders. Paged baghat pa for aflutter, new orders rec'd. Pt placed on amio gtt.

## 2018-04-07 NOTE — Progress Notes (Addendum)
3 Days Post-Op Procedure(s) (LRB): VIDEO ASSISTED THORACOSCOPY (VATS)/RIGHT LOWER LOBECTOMY (Right) NODE DISSECTION (N/A) Subjective: Alert, appropriate mental status.  No new problems. Did not progress with ambulation yesterday due to the atrial fibrillation. Converted to SR / SB at 0200.  Amiodarone infusion on hold since that time.  Tolerating PO's, passing gas. No BM yet.  Pain control adequate, using PCA less frequently and expressed agreement to convert to oral analgesics  Objective: Vital signs in last 24 hours: Temp:  [97.6 F (36.4 C)-98.3 F (36.8 C)] 97.6 F (36.4 C) (01/09 0340) Pulse Rate:  [56-124] 68 (01/09 0340) Cardiac Rhythm: Normal sinus rhythm (01/09 0700) Resp:  [14-20] 18 (01/09 0340) BP: (90-116)/(66-73) 116/72 (01/09 0340) SpO2:  [91 %-100 %] 94 % (01/09 0729) FiO2 (%):  [24 %] 24 % (01/09 0729)  Hemodynamic parameters for last 24 hours:    Intake/Output from previous day: 01/08 0701 - 01/09 0700 In: 554.4 [P.O.:240; I.V.:309.4] Out: 615 [Urine:525; Chest Tube:90] Intake/Output this shift: No intake/output data recorded.  General appearance: alert, cooperative and no distress Heart: regular rate and rhythm Lungs: clear to auscultation bilaterally Abdomen: soft, non-tender; bowel sounds normal; no masses,  no organomegaly Wound: CT secure. Right chest dressing dry.  Lab Results: Recent Labs    04/05/18 0424 04/06/18 0305  WBC 12.9* 13.0*  HGB 10.7* 11.1*  HCT 35.0* 36.3  PLT 243 227   BMET:  Recent Labs    04/06/18 0305 04/07/18 0500  NA 137 133*  K 4.1 4.1  CL 102 98  CO2 27 27  GLUCOSE 140* 129*  BUN 17 19  CREATININE 1.23* 1.26*  CALCIUM 8.4* 8.4*    PT/INR: No results for input(s): LABPROT, INR in the last 72 hours. 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 Stable respiratory status but still requiring supplemental O2 to maintain adequate SAO2.  Continue albuterol MDI's and work on mobility / pulmonary hygiene.  Remove CT and advance activity. D/C PCA.   Atrial Fibrillation:  Loaded with IV amiodarone yesterday, converted to SB at 0200 this morning and amiodarone on hold since that time. Evaluation by cardiology appreciated. Will defer to their team for antiarrhythmic management.   HTN: BP stable, continue Cozaar  CAD:  S/P coronary PTCI x 4. Plavix, ASA, and statin resumed  Cardiology considering alternate anticoagulation regimen              due to afib.  DVT PPX: continue Lovenox.  History of depression:  Effexor and Buspar resumed    LOS: 3 days    Antony Odea 04/07/2018 Patient seen and examined, agree with above In Sinus this AM PATH- T2N0 stage IB  Revonda Standard. Roxan Hockey, MD [facility] [phone]

## 2018-04-07 NOTE — Plan of Care (Signed)

## 2018-04-07 NOTE — Progress Notes (Signed)
Paged Erin PA of pt now in NSR HR 60's, to call cards for orders. Paged cards rhonda barrett PA. Waiting on call back. Stopped amio gtt at this time at pt's HR now 60.

## 2018-04-07 NOTE — Procedures (Signed)
Heart rate is too high for accurate echo at this time. 

## 2018-04-08 ENCOUNTER — Inpatient Hospital Stay (HOSPITAL_COMMUNITY): Payer: Medicare Other

## 2018-04-08 DIAGNOSIS — I4819 Other persistent atrial fibrillation: Secondary | ICD-10-CM

## 2018-04-08 LAB — BASIC METABOLIC PANEL
Anion gap: 10 (ref 5–15)
BUN: 22 mg/dL (ref 8–23)
CO2: 25 mmol/L (ref 22–32)
Calcium: 8 mg/dL — ABNORMAL LOW (ref 8.9–10.3)
Chloride: 99 mmol/L (ref 98–111)
Creatinine, Ser: 1.3 mg/dL — ABNORMAL HIGH (ref 0.44–1.00)
GFR calc Af Amer: 49 mL/min — ABNORMAL LOW (ref >60)
GFR calc non Af Amer: 42 mL/min — ABNORMAL LOW (ref >60)
Glucose, Bld: 130 mg/dL — ABNORMAL HIGH (ref 70–99)
Potassium: 4.5 mmol/L (ref 3.5–5.1)
Sodium: 134 mmol/L — ABNORMAL LOW (ref 135–145)

## 2018-04-08 LAB — MAGNESIUM: Magnesium: 2.1 mg/dL (ref 1.7–2.4)

## 2018-04-08 LAB — TSH: TSH: 0.633 u[IU]/mL (ref 0.350–4.500)

## 2018-04-08 MED ORDER — PRO-STAT SUGAR FREE PO LIQD
30.0000 mL | Freq: Three times a day (TID) | ORAL | Status: DC
  Filled 2018-04-08 (×3): qty 30

## 2018-04-08 MED ORDER — APIXABAN 5 MG PO TABS
5.0000 mg | ORAL_TABLET | Freq: Two times a day (BID) | ORAL | Status: DC
  Administered 2018-04-08 – 2018-04-09 (×3): 5 mg via ORAL
  Filled 2018-04-08 (×3): qty 1

## 2018-04-08 MED ORDER — AMIODARONE HCL 200 MG PO TABS
400.0000 mg | ORAL_TABLET | Freq: Two times a day (BID) | ORAL | Status: DC
  Administered 2018-04-08 – 2018-04-09 (×3): 400 mg via ORAL
  Filled 2018-04-08 (×3): qty 2

## 2018-04-08 MED ORDER — LEVALBUTEROL HCL 0.63 MG/3ML IN NEBU
0.6300 mg | INHALATION_SOLUTION | Freq: Three times a day (TID) | RESPIRATORY_TRACT | Status: DC
  Administered 2018-04-08 – 2018-04-09 (×4): 0.63 mg via RESPIRATORY_TRACT
  Filled 2018-04-08 (×6): qty 3

## 2018-04-08 NOTE — Progress Notes (Signed)
4 Days Post-Op Procedure(s) (LRB): VIDEO ASSISTED THORACOSCOPY (VATS)/RIGHT LOWER LOBECTOMY (Right) NODE DISSECTION (N/A) Subjective: Resting in bed. No complaints offered.  Says pain control is adequate.  Did not ambulated outside of the room much yesterday due to recurrent arrhythmias.  Tolerating PO's but no BM yet.  Objective: Vital signs in last 24 hours: Temp:  [97.8 F (36.6 C)-100.9 F (38.3 C)] 98.1 F (36.7 C) (01/10 0759) Pulse Rate:  [57-122] 57 (01/10 0759) Cardiac Rhythm: Normal sinus rhythm (01/10 0700) Resp:  [15-23] 17 (01/10 0759) BP: (91-144)/(59-86) 122/59 (01/10 0759) SpO2:  [93 %-99 %] 97 % (01/10 0759)    Intake/Output from previous day: 01/09 0701 - 01/10 0700 In: 1067.2 [P.O.:720; I.V.:347.2] Out: 840 [Urine:700; Chest Tube:140] Intake/Output this shift: No intake/output data recorded.  General appearance: alert, cooperative and no distress Heart: Regular rhythm. Monitor shows SR / SB Lungs: clear to auscultation bilaterally Abdomen: soft, non-tender; bowel sounds normal; no masses,  no organomegaly Wound: Right chest incision covered with clean, dry dressing.   Lab Results: Recent Labs    04/06/18 0305  WBC 13.0*  HGB 11.1*  HCT 36.3  PLT 227   BMET:  Recent Labs    04/06/18 0305 04/07/18 0500  NA 137 133*  K 4.1 4.1  CL 102 98  CO2 27 27  GLUCOSE 140* 129*  BUN 17 19  CREATININE 1.23* 1.26*  CALCIUM 8.4* 8.4*    PT/INR: No results for input(s): LABPROT, INR in the last 72 hours. 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 Stable respiratory  status but still requiring supplemental O2 to maintain adequate SAO2. Change albuterol to Xopenex MDI's and work on mobility / pulmonary hygiene.    Atrial Fibrillation: Loaded with IV amiodarone1/8/20, converted to SB at 0200 on 04/07/18 and amiodarone was held. Atrial bibrillation with RVR recurred yesterday so IV amiodarone was resumed.  Now back in SR / borderline SB. Will initiate oral amio today.  Evaluation by cardiology appreciated. OK to proceed with anticoagulation and adjustment of antiplatelet therapy on 04/09/18.   HTN: BP stable, continue Cozaar and bisoprolol  CAD: S/P coronary PTCI x 4. Plavix, ASA, and statin resumed             Cardiology considering alternate anticoagulation regimen              due to afib.  DVT PPX: continue Lovenox.  History of depression: Effexor and Buspar resumed    LOS: 4 days    Antony Odea, PA-C 04/08/2018

## 2018-04-08 NOTE — Progress Notes (Signed)
Nutrition Follow-up  DOCUMENTATION CODES:   Obesity unspecified  INTERVENTION:   -D/c Ensure Enlive po BID, each supplement provides 350 kcal and 20 grams of protein -30 ml Prostat TID, each supplement provides 100 kcals and 15 grams protein -Magic cup TID with meals, each supplement provides 290 kcal and 9 grams of protein -Continue MVI with minerals daily  NUTRITION DIAGNOSIS:   Increased nutrient needs related to post-op healing as evidenced by estimated needs.  Ongoing  GOAL:   Patient will meet greater than or equal to 90% of their needs  Progressing  MONITOR:   PO intake, Supplement acceptance, Weight trends, Labs, Skin, I & O's  REASON FOR ASSESSMENT:   Malnutrition Screening Tool    ASSESSMENT:   Maria Arroyo is a 68 year old woman with a long history of tobacco abuse (2 packs/day for greater than 35 years), COPD, coronary artery disease, four previous coronary stents, hypertension, hyperlipidemia, osteoarthritis, cervical disc disease, chronic back pain, neuropathy, gout, and depression.  She recently saw Dr. Melford Arroyo.  She had been feeling poorly.  She mentioned that she had a 25 pound weight loss over 3 months.  She complained of early satiety and also occasional nausea.  He did a work-up for an occult malignancy including a chest x-ray which showed a question of a right lower lobe lung nodule.  She was referred to Dr. Melvyn Arroyo.  He recommended a PET CT.  That showed the mass was markedly hypermetabolic with an SUV of 11.  She had some healing rib fractures on the right side from a fall a few months prior.  There was no evidence of regional or distant metastases.  1/6- Procedure(s):VIDEO ASSISTED THORACOSCOPY (VATS)/RIGHT LOWER LOBECTOMY (Right) 1/9- chest tube removed  Reviewed I/O's: +227 ml x 24 hours and +686 ml since admission  Pt sleeping soundly at time of visit. She did not respond to light touch or voice.   Case discussed with RN. She reports continued  poor appetite and refusing Ensure supplements. Noted meal completion 0-25%  Labs reviewed: Na: 134.   Diet Order:   Diet Order            Diet regular Room service appropriate? Yes; Fluid consistency: Thin  Diet effective now              EDUCATION NEEDS:   Education needs have been addressed  Skin:  Skin Assessment: Skin Integrity Issues: Skin Integrity Issues:: Incisions Incisions: rt chest incision Other: rt chest tube removed  Last BM:  04/03/18  Height:   Ht Readings from Last 1 Encounters:  04/04/18 5\' 1"  (1.549 m)    Weight:   Wt Readings from Last 1 Encounters:  04/04/18 76.2 kg    Ideal Body Weight:  47.7 kg  BMI:  Body mass index is 31.74 kg/m.  Estimated Nutritional Needs:   Kcal:  1650-1850  Protein:  80-95 grams  Fluid:  >1.6 L    Maria Arroyo A. Jimmye Norman, RD, LDN, CDE Pager: [phone] After hours Pager: [phone]

## 2018-04-08 NOTE — Progress Notes (Signed)
Progress Note  Patient Name: Maria Arroyo Date of Encounter: 04/08/2018  Primary Cardiologist: Peter Martinique, MD   Subjective   No chest pain or shortness of breath  Inpatient Medications    Scheduled Meds: . acetaminophen  1,000 mg Oral Q6H   Or  . acetaminophen (TYLENOL) oral liquid 160 mg/5 mL  1,000 mg Oral Q6H  . allopurinol  100 mg Oral Daily  . amiodarone  400 mg Oral BID  . apixaban  5 mg Oral BID  . aspirin EC  81 mg Oral Daily  . bisacodyl  10 mg Oral Daily  . bisoprolol  5 mg Oral Daily  . busPIRone  15 mg Oral BID  . feeding supplement (ENSURE ENLIVE)  237 mL Oral BID BM  . gabapentin  600 mg Oral QID  . Influenza vac split quadrivalent PF  0.5 mL Intramuscular Tomorrow-1000  . levalbuterol  0.63 mg Nebulization TID  . losartan  25 mg Oral Daily  . mouth rinse  15 mL Mouth Rinse BID  . multivitamin with minerals  1 tablet Oral Daily  . pantoprazole  40 mg Oral Daily  . rosuvastatin  40 mg Oral Daily  . senna-docusate  1 tablet Oral QHS  . venlafaxine XR  75 mg Oral BID   Continuous Infusions: . dextrose 5 % and 0.45% NaCl Stopped (04/06/18 0856)  . potassium chloride     PRN Meds: metoprolol tartrate, oxyCODONE, potassium chloride   Vital Signs    Vitals:   04/08/18 0759 04/08/18 0933 04/08/18 1024 04/08/18 1147  BP: (!) 122/59 97/60  99/66  Pulse: (!) 57 (!) 57 (!) 58 (!) 57  Resp: '17 17 13 14  ' Temp: 98.1 F (36.7 C)   98.5 F (36.9 C)  TempSrc: Oral   Oral  SpO2: 97% 91% 91% 92%  Weight:      Height:        Intake/Output Summary (Last 24 hours) at 04/08/2018 1210 Last data filed at 04/08/2018 1149 Gross per 24 hour  Intake 1067.22 ml  Output 700 ml  Net 367.22 ml   Last 3 Weights 04/04/2018 03/31/2018 03/17/2018  Weight (lbs) 168 lb 160 lb 158 lb 12.8 oz  Weight (kg) 76.204 kg 72.576 kg 72.031 kg      Telemetry    Sinus bradycardia, converted from atrial fibrillation early this morning. - Personally Reviewed   Physical Exam    Alert, oriented, in no distress GEN: No acute distress.   Neck: No JVD Cardiac:  Bradycardic and regular, no murmurs, rubs, or gallops.  Respiratory: Clear to auscultation bilaterally. GI: Soft, nontender, non-distended  MS: No edema; No deformity. Neuro:  Nonfocal  Psych: Normal affect   Labs    Chemistry Recent Labs  Lab 04/06/18 0305 04/07/18 0500 04/08/18 0253  NA 137 133* 134*  K 4.1 4.1 4.5  CL 102 98 99  CO2 '27 27 25  ' GLUCOSE 140* 129* 130*  BUN '17 19 22  ' CREATININE 1.23* 1.26* 1.30*  CALCIUM 8.4* 8.4* 8.0*  PROT 5.8*  --   --   ALBUMIN 2.5*  --   --   AST 11*  --   --   ALT 9  --   --   ALKPHOS 79  --   --   BILITOT 0.4  --   --   GFRNONAA 45* 44* 42*  GFRAA 53* 51* 49*  ANIONGAP '8 8 10     ' Hematology Recent Labs  Lab 04/05/18 0424  04/06/18 0305  WBC 12.9* 13.0*  RBC 3.64* 3.74*  HGB 10.7* 11.1*  HCT 35.0* 36.3  MCV 96.2 97.1  MCH 29.4 29.7  MCHC 30.6 30.6  RDW 15.4 15.4  PLT 243 227    Cardiac EnzymesNo results for input(s): TROPONINI in the last 168 hours. No results for input(s): TROPIPOC in the last 168 hours.   BNPNo results for input(s): BNP, PROBNP in the last 168 hours.   DDimer No results for input(s): DDIMER in the last 168 hours.   Radiology    Dg Chest 2 View  Result Date: 04/08/2018 CLINICAL DATA:  Status post right lobectomy. EXAM: CHEST - 2 VIEW COMPARISON:  04/07/2018 FINDINGS: Heart size is normal. Postsurgical change with volume loss in the right lower lung is again identified compatible with right lower lobectomy. No significant pneumothorax identified. Mild interstitial edema appears new from previous exam. Extensive right chest wall gas is again noted and is unchanged from previous study. IMPRESSION: 1. Mild pulmonary edema. 2. Postop change with volume loss involving the right lung 3. Similar right chest wall soft tissue emphysema. Electronically Signed   By: Kerby Moors M.D.   On: 04/08/2018 08:55   Dg Chest Port 1  View  Result Date: 04/07/2018 CLINICAL DATA:  Prior lung surgery. EXAM: PORTABLE CHEST 1 VIEW COMPARISON:  04/06/2018. FINDINGS: Right IJ line and right chest tube in stable position. No pneumothorax noted on today's exam. No prominent pleural effusion. Postsurgical changes right lung. Mild bibasilar atelectasis/infiltrates again noted. Stable cardiomegaly. Right chest wall subcutaneous emphysema again noted. Prior cervical spine fusion. IMPRESSION: 1. Right IJ line and right chest tube in stable position. No pneumothorax on today's exam. Right chest wall subcutaneous emphysema again noted. 2. Postsurgical changes right lung. Bibasilar atelectasis again noted. Electronically Signed   By: Marcello Moores  Register   On: 04/07/2018 06:51   Dg Chest Port 1v Same Day  Result Date: 04/07/2018 CLINICAL DATA:  Right-sided chest tube removal. Recent right lower lobectomy. EXAM: PORTABLE CHEST 1 VIEW COMPARISON:  Chest x-ray from same day at 6:11 a.m. FINDINGS: The patient is significantly rotated to the right, limiting evaluation. Interval removal of the right-sided chest tube and right internal jugular central venous catheter. No pneumothorax. Postsurgical changes at the right lung base again noted with unchanged volume loss in the right hemithorax. Mild atelectasis at the right lung base is unchanged. The left lung is clear. No pleural effusion. Stable mild cardiomegaly. No acute osseous abnormality. Unchanged right chest wall subcutaneous emphysema. IMPRESSION: 1. Limited study due to rightward rotation. Interval removal of the right chest tube. No pneumothorax. 2. Stable postsurgical changes and volume loss in the right hemithorax. Unchanged right basilar atelectasis. Electronically Signed   By: Titus Dubin M.D.   On: 04/07/2018 13:57    Cardiac Studies   2D echocardiogram 04/07/2018: Study Conclusions  - Left ventricle: EF hard to judge due to rapid afib. The cavity   size was normal. Wall thickness was normal.  Systolic function was   normal. The estimated ejection fraction was in the range of 50%   to 55%. The study is not technically sufficient to allow   evaluation of LV diastolic function. - Aortic valve: Sclerosis without stenosis. Valve area (VTI): 2.07   cm^2. Valve area (Vmean): 2.26 cm^2. - Right ventricle: The cavity size was mildly dilated.  Patient Profile     68 y.o. female with postoperative atrial fibrillation with RVR after VATS right lower lobectomy  Assessment & Plan  Persistent atrial fibrillation: Patient is required amiodarone to maintain sinus rhythm.  She has been converted from IV to oral amiodarone.  Clopidogrel was discontinued yesterday and she is now initiated on apixaban.  Reviewed case management note and it appears her first month of apixaban will be over $400 because she has not met her deductible.  Discussed with the patient's nurse who will review options further with Case Management.  I am hopeful that after she gets further out from surgery, she will not require long-term oral anticoagulation since her atrial fib has been in the postoperative period.  If she is discharged over the weekend, would continue oral amiodarone loading at 400 mg twice daily for 2 weeks, then 200 mg daily thereafter.  Will arrange outpatient cardiology follow-up with Dr. Martinique or his APP.  For questions or updates, please contact [place] Please consult [email or web address] for contact info under     Signed, Sherren Mocha, MD  04/08/2018, 12:10 PM

## 2018-04-08 NOTE — Plan of Care (Signed)
Problem: Clinical Measurements: Goal: Respiratory complications will improve Outcome: Progressing Goal: Cardiovascular complication will be avoided Outcome: Progressing   Problem: Activity: Goal: Risk for activity intolerance will decrease Outcome: Progressing   Problem: Pain Managment: Goal: General experience of comfort will improve Outcome: Progressing   Problem: Activity: Goal: Risk for activity intolerance will decrease Outcome: Progressing   Problem: Pain Management: Goal: Pain level will decrease Outcome: Progressing

## 2018-04-08 NOTE — Care Management Note (Signed)
Case Management Note  Patient Details  Name: Maria Arroyo MRN: [id number] Date of Birth: 1951/01/25  Subjective/Objective:     Pt is s/p VATS                Action/Plan:   PTA independent from home.  Pt has PCP.  Pt will discharge home on Eliquis - CM provided free 30 day card and informed pt of copay post card.  Attending aware that pt has not met deductible and will reassess NOAC needs and cost during follow up appts.   Expected Discharge Date:                  Expected Discharge Plan:  Home/Self Care  In-House Referral:     Discharge planning Services  CM Consult  Post Acute Care Choice:    Choice offered to:     DME Arranged:    DME Agency:     HH Arranged:    HH Agency:     Status of Service:  In process, will continue to follow  If discussed at Long Length of Stay Meetings, dates discussed:    Additional Comments:  Maryclare Labrador, RN 04/08/2018, 1:24 PM

## 2018-04-08 NOTE — Care Management (Signed)
Per Maria Arroyo. W/[employer] Co-pay amount for Eliquis 5 mg.30 day supply $408.90. Pt. Has not met her deductible of $435.00. Eliquis comes in 66m. And 2.569m. ID# [id number]

## 2018-04-09 ENCOUNTER — Other Ambulatory Visit: Payer: Self-pay | Admitting: Physician Assistant

## 2018-04-09 ENCOUNTER — Inpatient Hospital Stay (HOSPITAL_COMMUNITY): Payer: Medicare Other

## 2018-04-09 DIAGNOSIS — I480 Paroxysmal atrial fibrillation: Secondary | ICD-10-CM

## 2018-04-09 MED ORDER — OXYCODONE HCL 5 MG PO TABS: 5.0000 mg | ORAL_TABLET | Freq: Four times a day (QID) | ORAL | 0 refills | Status: DC | PRN

## 2018-04-09 MED ORDER — AMIODARONE HCL 200 MG PO TABS: ORAL_TABLET | ORAL | 1 refills | Status: DC

## 2018-04-09 MED ORDER — APIXABAN 5 MG PO TABS: 5.0000 mg | ORAL_TABLET | Freq: Two times a day (BID) | ORAL | 1 refills | Status: DC

## 2018-04-09 MED ORDER — POLYETHYLENE GLYCOL 3350 17 G PO PACK: 17.0000 g | PACK | Freq: Every day | ORAL | Status: DC | PRN

## 2018-04-09 MED ORDER — ACETAMINOPHEN 500 MG PO TABS: 1000.0000 mg | ORAL_TABLET | Freq: Four times a day (QID) | ORAL | 0 refills | Status: DC

## 2018-04-09 MED ORDER — POLYETHYLENE GLYCOL 3350 17 G PO PACK: 17.0000 g | PACK | Freq: Every day | ORAL | Status: DC

## 2018-04-09 MED ORDER — LOSARTAN POTASSIUM 25 MG PO TABS: 25.0000 mg | ORAL_TABLET | Freq: Every day | ORAL | 1 refills | Status: DC

## 2018-04-09 NOTE — Progress Notes (Addendum)
Pt ambulated 200 feet with RN.On room air,while ambulating, her Sats dropped to 77% .While ambulating with 2L oxygen, her Sats returned to 90% . At rest on room air patient's Sats are 93%.

## 2018-04-09 NOTE — Discharge Instructions (Signed)
Discharge Instructions:  1. You may shower, please wash incisions daily with soap and water and keep dry.  If you wish to cover wounds with dressing you may do so but please keep clean and change daily.  No tub baths or swimming until incisions have completely healed.  If your incisions become red or develop any drainage please call our office at [phone]  2. No Driving until cleared by Maria Arroyo office and you are no longer using narcotic pain medications  3. Fever of 101.5 for at least 24 hours with no source, please contact our office at [phone]  4. You may cough up some blood tinged sputum, this is normal.  If you cough up more than 2 TBSP of bright red blood please contact the office   5. Activity- up as tolerated, please walk at least 3 times per day.  Avoid strenuous activity, no lifting, pushing, or pulling with your arms over 8-10 lbs for a minimum of 6 weeks  6. If any questions or concerns arise, please do not hesitate to contact our office at [phone]   Information on my medicine - ELIQUIS (apixaban)  This medication education was reviewed with me or my healthcare representative as part of my discharge preparation.  Why was Eliquis prescribed for you? Eliquis was prescribed for you to reduce the risk of forming blood clots that can cause a stroke if you have a medical condition called atrial fibrillation (a type of irregular heartbeat) OR to reduce the risk of a blood clots forming after orthopedic surgery.  What do You need to know about Eliquis ? Take your Eliquis TWICE DAILY - one tablet in the morning and one tablet in the evening with or without food.  It would be best to take the doses about the same time each day.  If you have difficulty swallowing the tablet whole please discuss with your pharmacist how to take the medication safely.  Take Eliquis exactly as prescribed by your doctor and DO NOT stop taking Eliquis without talking to the doctor  who prescribed the medication.  Stopping may increase your risk of developing a new clot or stroke.  Refill your prescription before you run out.  After discharge, you should have regular check-up appointments with your healthcare provider that is prescribing your Eliquis.  In the future your dose may need to be changed if your kidney function or weight changes by a significant amount or as you get older.  What do you do if you miss a dose? If you miss a dose, take it as soon as you remember on the same day and resume taking twice daily.  Do not take more than one dose of ELIQUIS at the same time.  Important Safety Information A possible side effect of Eliquis is bleeding. You should call your healthcare provider right away if you experience any of the following: ? Bleeding from an injury or your nose that does not stop. ? Unusual colored urine (red or dark brown) or unusual colored stools (red or black). ? Unusual bruising for unknown reasons. ? A serious fall or if you hit your head (even if there is no bleeding).  Some medicines may interact with Eliquis and might increase your risk of bleeding or clotting while on Eliquis. To help avoid this, consult your healthcare provider or pharmacist prior to using any new prescription or non-prescription medications, including herbals, vitamins, non-steroidal anti-inflammatory drugs (NSAIDs) and supplements.  This website has more information on Eliquis (apixaban):  https://www.berry.org/.

## 2018-04-09 NOTE — Progress Notes (Signed)
  In NSR/SB on po amio. Can cut dose to 200 bid as needed. CM working on apixaban. Hopefully won't need long term.   Will follow at a distance.   Glori Bickers, MD  12:04 PM

## 2018-04-09 NOTE — Care Management Note (Signed)
Case Management Note  Patient Details  Name: Maria Arroyo MRN: [id number] Date of Birth: [birth year]  Oxygen and RW to be delivered to pt prior to d/c.   Expected Discharge Date:  04/09/18               Expected Discharge Plan:  Home/Self Care  In-House Referral:     Discharge planning Services  CM Consult  Post Acute Care Choice:  Durable Medical Equipment Choice offered to:  Patient  DME Arranged:  Oxygen, Walker rolling DME Agency:  [facility]:    [facility] Agency:     Status of Service:  In process, will continue to follow  If discussed at Long Length of Stay Meetings, dates discussed:    Additional Comments:  Claudie Leach, RN 04/09/2018, 2:48 PM

## 2018-04-09 NOTE — Progress Notes (Addendum)
      [address]       [place] [address]             [phone]      5 Days Post-Op Procedure(s) (LRB): VIDEO ASSISTED THORACOSCOPY (VATS)/RIGHT LOWER LOBECTOMY (Right) NODE DISSECTION (N/A) Subjective: Feels okay this morning. Still no bowel movement. SB in the 50s  Objective: Vital signs in last 24 hours: Temp:  [98 F (36.7 C)-98.5 F (36.9 C)] 98.5 F (36.9 C) (01/11 0816) Pulse Rate:  [54-68] 54 (01/11 0816) Cardiac Rhythm: Sinus bradycardia (01/11 0705) Resp:  [13-20] 18 (01/11 0816) BP: (97-140)/(60-99) 140/62 (01/11 0816) SpO2:  [91 %-99 %] 99 % (01/11 0816)     Intake/Output from previous day: 01/10 0701 - 01/11 0700 In: 326.9 [P.O.:240; I.V.:86.9] Out: -  Intake/Output this shift: No intake/output data recorded.  General appearance: alert, cooperative and no distress Heart: sinus brady Lungs: clear to auscultation bilaterally Abdomen: soft, non-tender; bowel sounds normal; no masses,  no organomegaly Extremities: extremities normal, atraumatic, no cyanosis or edema Wound: clean and dry  Lab Results: No results for input(s): WBC, HGB, HCT, PLT in the last 72 hours. BMET:  Recent Labs    04/07/18 0500 04/08/18 0253  NA 133* 134*  K 4.1 4.5  CL 98 99  CO2 27 25  GLUCOSE 129* 130*  BUN 19 22  CREATININE 1.26* 1.30*  CALCIUM 8.4* 8.0*    PT/INR: No results for input(s): LABPROT, INR in the last 72 hours. ABG    Component Value Date/Time   PHART 7.316 (L) 04/05/2018 0422   HCO3 28.0 04/05/2018 0422   O2SAT 96.0 04/05/2018 0422   CBG (last 3)  No results for input(s): GLUCAP in the last 72 hours.  Assessment/Plan: S/P Procedure(s) (LRB): VIDEO ASSISTED THORACOSCOPY (VATS)/RIGHT LOWER LOBECTOMY (Right) NODE DISSECTION (N/A)  1. CV-atrial fibrillation with RVR yesterday now SB with rate in the 50s-IV Amio resumed at that time-now on Amio 400mg  BID. She is currently on Eliquis but too expensive-case management working on  coupon. Continue asa, statin, and cozaar 2. Pulm-tolerating 1L [place] with good oxygenation. 3. Renal-creatinine 1.30, electrolytes okay 4. H and H stable, last labs 11.1/36.3 5. Depression-home medications resumed Effector and Buspar  Plan: Wean oxygen as tolerated. Ambulate in the halls today. Watch rhythm closely. Continue Amio PO. Home later today vs. Tomorrow.    LOS: 5 days    Elgie Collard 04/09/2018 Patient seen and examined, agree with above  Remo Lipps C. Roxan Hockey, MD [facility] [phone]

## 2018-04-14 ENCOUNTER — Other Ambulatory Visit: Payer: Self-pay | Admitting: *Deleted

## 2018-04-14 NOTE — Progress Notes (Signed)
The proposed treatment discussed in cancer conference 04/14/2018 is for discussion purpose only and is not a binding recommendation.  The patient was not physically examined nor present for their treatment options.  Therefore, final treatment plans cannot be decided.

## 2018-04-18 ENCOUNTER — Other Ambulatory Visit: Payer: Self-pay | Admitting: Thoracic Surgery (Cardiothoracic Vascular Surgery)

## 2018-04-18 DIAGNOSIS — R911 Solitary pulmonary nodule: Secondary | ICD-10-CM

## 2018-04-19 ENCOUNTER — Ambulatory Visit (INDEPENDENT_AMBULATORY_CARE_PROVIDER_SITE_OTHER): Payer: Self-pay | Admitting: Thoracic Surgery (Cardiothoracic Vascular Surgery)

## 2018-04-19 ENCOUNTER — Ambulatory Visit
Admission: RE | Admit: 2018-04-19 | Discharge: 2018-04-19 | Disposition: A | Discharge status: Home / Self Care | Payer: Medicare Other | Source: Ambulatory Visit | Attending: Thoracic Surgery (Cardiothoracic Vascular Surgery) | Admitting: Thoracic Surgery (Cardiothoracic Vascular Surgery)

## 2018-04-19 ENCOUNTER — Other Ambulatory Visit: Payer: Self-pay

## 2018-04-19 ENCOUNTER — Encounter: Payer: Self-pay | Admitting: Thoracic Surgery (Cardiothoracic Vascular Surgery)

## 2018-04-19 VITALS — BP 110/70 | HR 49 | Resp 18 | Ht 60.5 in | Wt 163.8 lb

## 2018-04-19 DIAGNOSIS — Z902 Acquired absence of lung [part of]: Secondary | ICD-10-CM

## 2018-04-19 DIAGNOSIS — C3490 Malignant neoplasm of unspecified part of unspecified bronchus or lung: Secondary | ICD-10-CM

## 2018-04-19 DIAGNOSIS — R911 Solitary pulmonary nodule: Secondary | ICD-10-CM

## 2018-04-19 NOTE — Progress Notes (Signed)
[address]       [place] [address]             [phone]     HPI: Maria Arroyo returns for scheduled postoperative follow-up visit  Maria Arroyo is a 68 year old woman with a history of tobacco abuse, COPD, coronary disease with previous stents, hypertension, hyperlipidemia, osteoarthritis, chronic back pain, neuropathy, gout, and depression.  She presented with a 25 pound weight loss.  Work-up showed a right lower lobe lung nodule.  This was hypermetabolic on PET/CT.  I did a thoracoscopic right lower lobectomy on 04/04/2018.  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  She was started on amiodarone.  She converted to sinus rhythm prior to discharge.  She was sent home on Eliquis.  She has a follow-up appointment scheduled with cardiology on Thursday.  She feels well.  She denies any significant incisional pain.  She is not taking any narcotics.  She is not having any shortness of breath.  Past Medical History:  Diagnosis Date  . Cervical disc disease   . COPD (chronic obstructive pulmonary disease) ([place])   . Coronary artery disease   . Depression   . Gout    R foot and knee  . Hyperlipidemia   . Hypertension   . Neuropathy   . OA (osteoarthritis)   . Tobacco abuse     Current Outpatient Medications  Medication Sig Dispense Refill  . acetaminophen (TYLENOL) 500 MG tablet Take 2 tablets (1,000 mg total) by mouth every 6 (six) hours. 30 tablet 0  . allopurinol (ZYLOPRIM) 100 MG tablet Take 100 mg by mouth daily.    [name] amiodarone (PACERONE) 200 MG tablet Take 2 tabs (400 mg)  twice daily for 14 days, then 1 tab (200 mg) daily thereafter. 90 tablet 1  . apixaban (ELIQUIS) 5 MG TABS tablet Take 1 tablet (5 mg total) by mouth 2 (two) times daily. 60 tablet 1  . aspirin 81 MG tablet Take 81 mg by mouth daily.    . bisoprolol (ZEBETA) 5 MG tablet Take 1 tablet  (5 mg total) by mouth daily. 30 tablet 11  . busPIRone (BUSPAR) 15 MG tablet Take 15 mg by mouth 2 (two) times daily.    . Calcium Carbonate-Vitamin D (CALCIUM 600 + D PO) Take 1 tablet by mouth daily.     . Cholecalciferol (VITAMIN D) 2000 UNITS tablet Take 4,000 Units by mouth daily.     [name] gabapentin (NEURONTIN) 600 MG tablet Take 1 tablet (600 mg total) by mouth 4 (four) times daily. At 8am 1pm 6pm and 10pm 120 tablet 11  . losartan (COZAAR) 25 MG tablet Take 1 tablet (25 mg total) by mouth daily. 30 tablet 1  . Multiple Vitamin (MULTIVITAMIN) tablet Take 1 tablet by mouth daily.     . nitroGLYCERIN (NITROSTAT) 0.4 MG SL tablet Place 1 tablet (0.4 mg total) under the tongue every 5 (five) minutes as needed. For chest pain (Patient taking differently: Place 0.4 mg under the tongue every 5 (five) minutes as needed for chest pain. ) 100 tablet 0  . PROVENTIL HFA 108 (90 Base) MCG/ACT inhaler Inhale 1-2 puffs into the lungs every 4 (four) hours as needed for wheezing or shortness of breath.   4  . rosuvastatin (CRESTOR) 40 MG tablet Take 1 tablet (40 mg total) by mouth daily. NEED OV.  90 tablet 0  . venlafaxine XR (EFFEXOR-XR) 75 MG 24 hr capsule Take 75 mg by mouth 2 (two) times daily.   0   No current facility-administered medications for this visit.     Physical Exam BP 110/70 (BP Location: Right Arm, Patient Position: Sitting, Cuff Size: Normal)   Pulse (!) 49   Resp 18   Ht 5' 0.5" (1.537 m)   Wt 163 lb 12.8 oz (74.3 kg)   SpO2 (!) 88% Comment: RA  BMI 31.63 kg/m  68 year old woman in no acute distress Alert and oriented x3 with no focal deficits Lungs diminished at right base, otherwise clear Incisions well-healed Cardiac bradycardic and regular  Diagnostic Tests: CHEST - 2 VIEW  COMPARISON:  04/09/2018 and prior exams  FINDINGS: Cardiomediastinal silhouette is unchanged.  Improved RIGHT lung aeration noted with decreased RIGHT LOWER lung atelectasis.  There is no  evidence of pneumothorax.  There may be a small RIGHT pleural effusion present.  The LEFT lung is clear.  No acute bony abnormalities are identified.  IMPRESSION: Improved RIGHT lung aeration with decreased RIGHT LOWER lung atelectasis. No pneumothorax.   Electronically Signed   By: Margarette Canada M.D.   On: 04/19/2018 09:39 I personally reviewed the chest x-ray images and concur with the findings noted above  Impression: Maria Arroyo is a 68 year old woman with a history of tobacco abuse who underwent a thoracoscopic right lower lobectomy for a T2, N0, stage Ib squamous cell carcinoma 2 weeks ago.  She had some atrial fibrillation postoperatively, but ultimately converted to sinus rhythm with amiodarone.  She is doing extremely well currently.  She has minimal discomfort.  She is not having any significant shortness of breath.  There are no restrictions on her activities, but she was encouraged to build into activities gradually.  I think she would benefit from pulmonary rehab.  We will make that referral.  We will also refer her to our multidisciplinary thoracic oncology clinic.  She should not need adjuvant therapy but will need long-term follow-up.  Atrial fibrillation-bradycardic today.  She is on amiodarone 400 mg twice daily.  She is scheduled to go down to 200 mg a day starting on 04/23/2018.  I instructed her to go ahead and decrease her amiodarone to 200 mg a day.  Plan: Referral to multidisciplinary thoracic oncology clinic to see oncology Referral to pulmonary rehab Return in 2 months with PA and lateral chest x-ray  Melrose Nakayama, MD [facility] [phone]

## 2018-04-19 NOTE — Patient Instructions (Signed)
Decrease amiodarone to 1 tablet daily

## 2018-04-20 ENCOUNTER — Ambulatory Visit: Payer: Self-pay | Admitting: Thoracic Surgery (Cardiothoracic Vascular Surgery)

## 2018-04-22 ENCOUNTER — Other Ambulatory Visit: Payer: Self-pay | Admitting: Nurse Practitioner

## 2018-04-22 ENCOUNTER — Ambulatory Visit (INDEPENDENT_AMBULATORY_CARE_PROVIDER_SITE_OTHER): Payer: Medicare Other | Admitting: Physician Assistant

## 2018-04-22 ENCOUNTER — Encounter: Payer: Self-pay | Admitting: Physician Assistant

## 2018-04-22 ENCOUNTER — Telehealth: Payer: Self-pay | Admitting: *Deleted

## 2018-04-22 VITALS — BP 122/70 | HR 45 | Ht 61.0 in | Wt 166.2 lb

## 2018-04-22 DIAGNOSIS — J449 Chronic obstructive pulmonary disease, unspecified: Secondary | ICD-10-CM | POA: Diagnosis not present

## 2018-04-22 DIAGNOSIS — I2510 Atherosclerotic heart disease of native coronary artery without angina pectoris: Secondary | ICD-10-CM | POA: Diagnosis not present

## 2018-04-22 DIAGNOSIS — I10 Essential (primary) hypertension: Secondary | ICD-10-CM | POA: Diagnosis not present

## 2018-04-22 DIAGNOSIS — I480 Paroxysmal atrial fibrillation: Secondary | ICD-10-CM

## 2018-04-22 DIAGNOSIS — E785 Hyperlipidemia, unspecified: Secondary | ICD-10-CM

## 2018-04-22 DIAGNOSIS — C3491 Malignant neoplasm of unspecified part of right bronchus or lung: Secondary | ICD-10-CM

## 2018-04-22 MED ORDER — FUROSEMIDE 20 MG PO TABS: 20.0000 mg | ORAL_TABLET | Freq: Every day | ORAL | 2 refills | Status: DC

## 2018-04-22 MED ORDER — BISOPROLOL FUMARATE 5 MG PO TABS: 2.5000 mg | ORAL_TABLET | Freq: Every day | ORAL | 11 refills | Status: DC

## 2018-04-22 NOTE — Telephone Encounter (Signed)
Oncology Nurse Navigator Documentation  Oncology Nurse Navigator Flowsheets 04/22/2018  Navigator Location [facility]  Referral date to RadOnc/MedOnc 04/19/2018  Navigator Encounter Type Telephone/I received referral on Maria Arroyo.  I called to schedule her to be seen with Dr. Julien Nordmann but was unable to reach her.  I did leave vm message for her to call me with my name and phone number.   Telephone Outgoing Call  Treatment Phase Pre-Tx/Tx Discussion  Barriers/Navigation Needs Coordination of Care  Interventions Coordination of Care  Coordination of Care Other  Acuity Level 1  Time Spent with Patient 15

## 2018-04-22 NOTE — Patient Instructions (Signed)
Medication Instructions:  TAKE FUROSEMIDE(LASIX) 20MG  FOR 2 DAYS THEN STOP-CALL IF SWELLING CONTINUES DECREASE BISOPROLOL 2.5MG  DAILY If you need a refill on your cardiac medications before your next appointment, please call your pharmacy.  Labwork: NONE ORDERED When you have your labs (blood work) drawn today and your tests are completely normal, you will receive your results only by MyChart Message (if you have MyChart) -OR-  A paper copy in the mail.  If you have any lab test that is abnormal or we need to change your treatment, we will call you to review these results.  Special Instructions: PLEASE CALL IF SWELLING CONTINUES  Follow-Up: You will need a follow up appointment in 2-3 weeks.  You may see Peter Martinique, MD or one of the following Advanced Practice Providers on your designated Care Team:  Almyra Deforest, [place]   Fabian Sharp, PA-C  At [facility], you and your health needs are our priority.  As part of our continuing mission to provide you with exceptional heart care, we have created designated Provider Care Teams.  These Care Teams include your primary Cardiologist (physician) and Advanced Practice Providers (APPs -  Physician Assistants and Nurse Practitioners) who all work together to provide you with the care you need, when you need it.  Thank you for choosing [facility] at [facility]!!

## 2018-04-22 NOTE — Progress Notes (Signed)
Cardiology Office Note    Date:  04/24/2018   ID:  Maria Arroyo, Maria Arroyo [birth year], MRN [id number]  PCP:  [name], MD  Cardiologist:  Dr. Martinique   Chief Complaint  Patient presents with  . Follow-up    seen for Dr. Martinique.     History of Present Illness:  Maria VANDERVOORT is a 68 y.o. female with PMH of HTN, HLD, COPD, tobacco abuse, lung nodule and CAD.  Patient underwent stenting of mid RCA in December 1999.  She returned in October 2002 and had stenting of proximal RCA.  In January 2011, she developed a high-grade stenosis in between the prior stents and that this was stented with additional drug-eluting stent.  She required repeat cardiac catheterization in September 2013, this revealed nonobstructive CAD.  Since then, she has been maintained on dual antiplatelet therapy.  Her last office visit with Dr. Martinique was on 01/13/2017 at which time she was doing well.  Due to significant unexplained weight loss, she underwent work-up for occult malignancy.  Chest x-ray showed a question of right lower lobe lung nodule.  PET scan showed hypermetabolic mass in RLL.  Patient was referred to Dr. Roxan Hockey for further evaluation.  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  Her Plavix was held prior to the procedure.  Postprocedure, her hospital course was complicated by occurrence of atrial fibrillation requiring IV amiodarone.  She was started on Eliquis and discharged with the downward titration of amiodarone.  Her amiodarone will be 400 mg twice daily for 2 weeks then 200 mg daily thereafter.  Patient presents today for cardiology office visit.  She was seen back by Dr. Roxan Hockey in the clinic on 04/19/2018, she was referred to pulmonary rehab and also oncology service.  It is planned to repeat chest x-ray in 2 months.  Her heart rate is slow, her amiodarone was reduced to 200 mg daily on 1/21.  She  presents today for cardiology follow-up, her heart rate remained bradycardic in the 40s.  She however denies any significant dizziness or weakness.  She also denies any recent chest pain or shortness of breath.  I will further decrease her bisoprolol to 2.5 mg daily.  She has been having some ankle edema after the surgery, I will give her a as needed dose of Lasix.  I instructed her to take the Lasix for 2 days then change to as needed after that.  We will try to obtain medication assistance for her Eliquis which cost over $400.  Patient is unclear if high price for the medication as before or after deductible, I suspect it may be prior to her deductible although patient says it is after the deductible.   Past Medical History:  Diagnosis Date  . Cervical disc disease   . COPD (chronic obstructive pulmonary disease) ([place])   . Coronary artery disease   . Depression   . Gout    R foot and knee  . Hyperlipidemia   . Hypertension   . Neuropathy   . OA (osteoarthritis)   . Tobacco abuse     Past Surgical History:  Procedure Laterality Date  . ABDOMINAL HYSTERECTOMY    . ANTERIOR CERVICAL DECOMP/DISCECTOMY FUSION  02/06/2011   Procedure: ANTERIOR CERVICAL DECOMPRESSION/DISCECTOMY FUSION 2 LEVELS;  Surgeon: Winfield Cunas;  Location: [facility];  Service: Neurosurgery;  Laterality: N/A;  Anterior Cervical Four-Five/Five-Six Decompression and Fusion with Plating and Bonegraft  .  BLADDER SURGERY    . CARDIAC CATHETERIZATION  04/09/2009   EF 60%  . CARDIAC CATHETERIZATION  03/24/2001   EF 55%  . CARDIAC CATHETERIZATION  01/06/2001   EF 65%  . CHOLECYSTECTOMY    . CORONARY STENT PLACEMENT  03/2009   STENTING OF THE PROXIMAL TO MID RIGHT CORONARY  . LEFT HEART CATHETERIZATION WITH CORONARY ANGIOGRAM N/A 12/21/2011   Procedure: LEFT HEART CATHETERIZATION WITH CORONARY ANGIOGRAM;  Surgeon: Peter M Martinique, MD;  Location: [facility] CATH [facility];  Service: Cardiovascular;  Laterality: N/A;  . NODE DISSECTION N/A  04/04/2018   Procedure: NODE DISSECTION;  Surgeon: Melrose Nakayama, MD;  Location: [facility];  Service: Thoracic;  Laterality: N/A;  . OTHER SURGICAL HISTORY  12/2014   R foot cyst removal  . VIDEO ASSISTED THORACOSCOPY (VATS)/ LOBECTOMY Right 04/04/2018   Procedure: VIDEO ASSISTED THORACOSCOPY (VATS)/RIGHT LOWER LOBECTOMY;  Surgeon: Melrose Nakayama, MD;  Location: [facility];  Service: Thoracic;  Laterality: Right;    Current Medications: Outpatient Medications Prior to Visit  Medication Sig Dispense Refill  . acetaminophen (TYLENOL) 500 MG tablet Take 2 tablets (1,000 mg total) by mouth every 6 (six) hours. 30 tablet 0  . allopurinol (ZYLOPRIM) 100 MG tablet Take 200 mg by mouth daily.    [name] amiodarone (PACERONE) 200 MG tablet Take 2 tabs (400 mg)  twice daily for 14 days, then 1 tab (200 mg) daily thereafter. 90 tablet 1  . apixaban (ELIQUIS) 5 MG TABS tablet Take 1 tablet (5 mg total) by mouth 2 (two) times daily. 60 tablet 1  . aspirin 81 MG tablet Take 81 mg by mouth daily.    . busPIRone (BUSPAR) 15 MG tablet Take 15 mg by mouth 2 (two) times daily.    . Calcium Carbonate-Vitamin D (CALCIUM 600 + D PO) Take 1 tablet by mouth daily.     . carisoprodol (SOMA) 350 MG tablet Take 1 tablet by mouth 4 (four) times daily as needed for muscle spasms.    . Cholecalciferol (VITAMIN D) 2000 UNITS tablet Take 4,000 Units by mouth daily.     [name] gabapentin (NEURONTIN) 600 MG tablet Take 1 tablet (600 mg total) by mouth 4 (four) times daily. At 8am 1pm 6pm and 10pm 120 tablet 11  . losartan (COZAAR) 25 MG tablet Take 1 tablet (25 mg total) by mouth daily. 30 tablet 1  . Multiple Vitamin (MULTIVITAMIN) tablet Take 1 tablet by mouth daily.     . nitroGLYCERIN (NITROSTAT) 0.4 MG SL tablet Place 1 tablet (0.4 mg total) under the tongue every 5 (five) minutes as needed. For chest pain (Patient taking differently: Place 0.4 mg under the tongue every 5 (five) minutes as needed for chest pain. ) 100 tablet 0  .  oxyCODONE-acetaminophen (PERCOCET) 10-325 MG tablet Take 1 tablet by mouth 4 (four) times daily as needed for pain.    [name] PROVENTIL HFA 108 (90 Base) MCG/ACT inhaler Inhale 1-2 puffs into the lungs every 4 (four) hours as needed for wheezing or shortness of breath.   4  . rosuvastatin (CRESTOR) 40 MG tablet Take 1 tablet (40 mg total) by mouth daily. NEED OV. 90 tablet 0  . venlafaxine XR (EFFEXOR-XR) 75 MG 24 hr capsule Take 75 mg by mouth 2 (two) times daily.   0  . bisoprolol (ZEBETA) 5 MG tablet Take 1 tablet (5 mg total) by mouth daily. 30 tablet 11   No facility-administered medications prior to visit.      Allergies:  Ace inhibitors; Codeine; and Codeine-guaifenesin [guaifenesin-codeine]   Social History   Socioeconomic History  . Marital status: Married    Spouse name: Not on file  . Number of children: 2  . Years of education: 10  . Highest education level: Not on file  Occupational History  . Occupation: housewife    Employer: UNEMPLOYED  Social Needs  . Financial resource strain: Not on file  . Food insecurity:    Worry: Not on file    Inability: Not on file  . Transportation needs:    Medical: Not on file    Non-medical: Not on file  Tobacco Use  . Smoking status: Current Every Day Smoker    Packs/day: 2.00    Years: 35.00    Pack years: 70.00    Types: Cigarettes  . Smokeless tobacco: Never Used  . Tobacco comment: 1-1.5 ppd (10/02/13), 03/17/18 2 PPD  Substance and Sexual Activity  . Alcohol use: No    Alcohol/week: 0.0 standard drinks  . Drug use: No  . Sexual activity: Not on file  Lifestyle  . Physical activity:    Days per week: Not on file    Minutes per session: Not on file  . Stress: Not on file  Relationships  . Social connections:    Talks on phone: Not on file    Gets together: Not on file    Attends religious service: Not on file    Active member of club or organization: Not on file    Attends meetings of clubs or organizations: Not on file     Relationship status: Not on file  Other Topics Concern  . 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   The patient has a 10th grade education.   The patient is left-handed.   The patient has two children.   Patient drinks three cups of soda daily.         Family History:  The patient's family history includes Aneurysm in her mother; Heart attack in her brother and father; Heart failure in her father; Stroke in her sister.   ROS:   Please see the history of present illness.    ROS All other systems reviewed and are negative.   PHYSICAL EXAM:   VS:  BP 122/70   Pulse (!) 45   Ht 5\' 1"  (1.549 m)   Wt 166 lb 3.2 oz (75.4 kg)   SpO2 96%   BMI 31.40 kg/m    GEN: Well nourished, well developed, in no acute distress  HEENT: normal  Neck: no JVD, carotid bruits, or masses Cardiac: RRR; no murmurs, rubs, or gallops,no edema  Respiratory:  clear to auscultation bilaterally, normal work of breathing GI: soft, nontender, nondistended, + BS MS: no deformity or atrophy  Skin: warm and dry, no rash Neuro:  Alert and Oriented x 3, Strength and sensation are intact Psych: euthymic mood, full affect  Wt Readings from Last 3 Encounters:  04/22/18 166 lb 3.2 oz (75.4 kg)  04/19/18 163 lb 12.8 oz (74.3 kg)  04/04/18 168 lb (76.2 kg)      Studies/Labs Reviewed:   EKG:  EKG is ordered today.  The ekg ordered today demonstrates sinus bradycardia, heart rate 45  Recent Labs: 04/06/2018: ALT 9; Hemoglobin 11.1; Platelets 227 04/08/2018: BUN 22; Creatinine, Ser 1.30; Magnesium 2.1; Potassium 4.5; Sodium 134; TSH 0.633   Lipid Panel No results found for: CHOL, TRIG, HDL,  CHOLHDL, VLDL, LDLCALC, LDLDIRECT  Additional studies/ records that were reviewed today include:   Echo 04/07/2018 LV EF: 50% -   55%  ------------------------------------------------------------------- Indications:      Atrial fibrillation -  427.31.  ------------------------------------------------------------------- Study Conclusions  - Left ventricle: EF hard to judge due to rapid afib. The cavity   size was normal. Wall thickness was normal. Systolic function was   normal. The estimated ejection fraction was in the range of 50%   to 55%. The study is not technically sufficient to allow   evaluation of LV diastolic function. - Aortic valve: Sclerosis without stenosis. Valve area (VTI): 2.07   cm^2. Valve area (Vmean): 2.26 cm^2. - Right ventricle: The cavity size was mildly dilated.    ASSESSMENT:    1. PAF (paroxysmal atrial fibrillation) ([place])   2. Essential hypertension   3. Hyperlipidemia, unspecified hyperlipidemia type   4. Chronic obstructive pulmonary disease, unspecified COPD type ([place])   5. Coronary artery disease involving native coronary artery of native heart without angina pectoris   6. Malignant neoplasm of right lung, unspecified part of lung ([place])      PLAN:  In order of problems listed above:  1. Paroxysmal atrial fibrillation: Maintaining sinus rhythm with amiodarone and bisoprolol.  Continue on Eliquis.  Heart rate is slow in the 40s.  Recently, amiodarone was decreased to 200 mg daily by CT surgery.  I will further decrease bisoprolol to 2.5 mg daily.  If heart rate remained in the 40s, will discontinue bisoprolol.  2. Lung cancer: Recently underwent right lower lobe VATS procedure.  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  Will defer further treatment to the oncology service.  Although pathology report mentioned the left lower lobe lung cancer, however both PET scans and recent VATS procedure was done on the right lower lobe, the left lower lobe that was mentioned in the pathology report is likely a error.  Lung cancer was in fact in the right lower lobe.  3. CAD: No recent chest pain.  Continue aspirin  4. Hypertension: Blood pressure stable  5. Hyperlipidemia: Continue  Crestor 40 mg daily.  6. COPD: No recent exacerbation.    Medication Adjustments/Labs and Tests Ordered: Current medicines are reviewed at length with the patient today.  Concerns regarding medicines are outlined above.  Medication changes, Labs and Tests ordered today are listed in the Patient Instructions below. Patient Instructions  Medication Instructions:  TAKE FUROSEMIDE(LASIX) 20MG  FOR 2 DAYS THEN STOP-CALL IF SWELLING CONTINUES DECREASE BISOPROLOL 2.5MG  DAILY If you need a refill on your cardiac medications before your next appointment, please call your pharmacy.  Labwork: NONE ORDERED When you have your labs (blood work) drawn today and your tests are completely normal, you will receive your results only by MyChart Message (if you have MyChart) -OR-  A paper copy in the mail.  If you have any lab test that is abnormal or we need to change your treatment, we will call you to review these results.  Special Instructions: PLEASE CALL IF SWELLING CONTINUES  Follow-Up: You will need a follow up appointment in 2-3 weeks.  You may see Peter Martinique, MD or one of the following Advanced Practice Providers on your designated Care Team:  Almyra Deforest, [place]   Fabian Sharp, PA-C  At [facility], you and your health needs are our priority.  As part of our continuing mission to provide you with exceptional heart care, we have created designated Provider Care Teams.  These  Care Teams include your primary Cardiologist (physician) and Advanced Practice Providers (APPs -  Physician Assistants and Nurse Practitioners) who all work together to provide you with the care you need, when you need it.  Thank you for choosing [facility] at [facility], Almyra Deforest, [place]  04/24/2018 10:57 PM    [address], [place], [place]  [address] Phone: [phone]; Fax: [phone]

## 2018-04-24 ENCOUNTER — Encounter: Payer: Self-pay | Admitting: Physician Assistant

## 2018-04-25 ENCOUNTER — Telehealth: Payer: Self-pay | Admitting: *Deleted

## 2018-04-25 NOTE — Telephone Encounter (Signed)
Oncology Nurse Navigator Documentation  Oncology Nurse Navigator Flowsheets 04/25/2018  Navigator Location [facility]  Navigator Encounter Type Telephone/I called to schedule Maria Arroyo. I was unable to reach but did leave a vm message for her to call me with my name and phone number.  Telephone Outgoing Call  Treatment Phase Pre-Tx/Tx Discussion  Barriers/Navigation Needs Education  Education Other  Interventions Education  Education Method Verbal  Acuity Level 1  Time Spent with Patient 15

## 2018-04-26 ENCOUNTER — Other Ambulatory Visit: Payer: Self-pay

## 2018-04-26 DIAGNOSIS — C3490 Malignant neoplasm of unspecified part of unspecified bronchus or lung: Secondary | ICD-10-CM

## 2018-04-27 ENCOUNTER — Other Ambulatory Visit: Payer: Self-pay | Admitting: *Deleted

## 2018-04-27 DIAGNOSIS — Z902 Acquired absence of lung [part of]: Secondary | ICD-10-CM

## 2018-04-28 ENCOUNTER — Inpatient Hospital Stay (HOSPITAL_BASED_OUTPATIENT_CLINIC_OR_DEPARTMENT_OTHER): Payer: Medicare Other | Admitting: Internal Medicine

## 2018-04-28 ENCOUNTER — Telehealth: Payer: Self-pay | Admitting: Internal Medicine

## 2018-04-28 ENCOUNTER — Inpatient Hospital Stay: Payer: Medicare Other | Attending: Internal Medicine

## 2018-04-28 ENCOUNTER — Encounter: Payer: Self-pay | Admitting: Internal Medicine

## 2018-04-28 VITALS — BP 117/65 | HR 60 | Temp 98.4°F | Resp 17 | Ht 61.0 in | Wt 162.0 lb

## 2018-04-28 DIAGNOSIS — Z902 Acquired absence of lung [part of]: Secondary | ICD-10-CM

## 2018-04-28 DIAGNOSIS — I10 Essential (primary) hypertension: Secondary | ICD-10-CM

## 2018-04-28 DIAGNOSIS — F17210 Nicotine dependence, cigarettes, uncomplicated: Secondary | ICD-10-CM | POA: Diagnosis not present

## 2018-04-28 DIAGNOSIS — Z79899 Other long term (current) drug therapy: Secondary | ICD-10-CM

## 2018-04-28 DIAGNOSIS — Z7982 Long term (current) use of aspirin: Secondary | ICD-10-CM | POA: Insufficient documentation

## 2018-04-28 DIAGNOSIS — C3491 Malignant neoplasm of unspecified part of right bronchus or lung: Secondary | ICD-10-CM

## 2018-04-28 DIAGNOSIS — E785 Hyperlipidemia, unspecified: Secondary | ICD-10-CM

## 2018-04-28 DIAGNOSIS — C3490 Malignant neoplasm of unspecified part of unspecified bronchus or lung: Secondary | ICD-10-CM

## 2018-04-28 DIAGNOSIS — C3431 Malignant neoplasm of lower lobe, right bronchus or lung: Secondary | ICD-10-CM

## 2018-04-28 DIAGNOSIS — J449 Chronic obstructive pulmonary disease, unspecified: Secondary | ICD-10-CM | POA: Diagnosis not present

## 2018-04-28 LAB — CBC WITH DIFFERENTIAL (CANCER CENTER ONLY)
Abs Immature Granulocytes: 0.03 10*3/uL (ref 0.00–0.07)
Basophils Absolute: 0 10*3/uL (ref 0.0–0.1)
Basophils Relative: 0 %
Eosinophils Absolute: 0.3 10*3/uL (ref 0.0–0.5)
Eosinophils Relative: 3 %
HCT: 35.9 % — ABNORMAL LOW (ref 36.0–46.0)
Hemoglobin: 10.8 g/dL — ABNORMAL LOW (ref 12.0–15.0)
IMMATURE GRANULOCYTES: 0 %
Lymphocytes Relative: 16 %
Lymphs Abs: 1.5 10*3/uL (ref 0.7–4.0)
MCH: 28.4 pg (ref 26.0–34.0)
MCHC: 30.1 g/dL (ref 30.0–36.0)
MCV: 94.5 fL (ref 80.0–100.0)
Monocytes Absolute: 0.6 10*3/uL (ref 0.1–1.0)
Monocytes Relative: 7 %
Neutro Abs: 6.7 10*3/uL (ref 1.7–7.7)
Neutrophils Relative %: 74 %
PLATELETS: 279 10*3/uL (ref 150–400)
RBC: 3.8 MIL/uL — ABNORMAL LOW (ref 3.87–5.11)
RDW: 16.5 % — ABNORMAL HIGH (ref 11.5–15.5)
WBC Count: 9.1 10*3/uL (ref 4.0–10.5)
nRBC: 0 % (ref 0.0–0.2)

## 2018-04-28 LAB — CMP (CANCER CENTER ONLY)
ALK PHOS: 111 U/L (ref 38–126)
ALT: 14 U/L (ref 0–44)
AST: 13 U/L — ABNORMAL LOW (ref 15–41)
Albumin: 2.7 g/dL — ABNORMAL LOW (ref 3.5–5.0)
Anion gap: 8 (ref 5–15)
BUN: 25 mg/dL — ABNORMAL HIGH (ref 8–23)
CALCIUM: 8.6 mg/dL — AB (ref 8.9–10.3)
CO2: 29 mmol/L (ref 22–32)
CREATININE: 1.4 mg/dL — AB (ref 0.44–1.00)
Chloride: 102 mmol/L (ref 98–111)
GFR, Est AFR Am: 45 mL/min — ABNORMAL LOW (ref >60)
GFR, Estimated: 39 mL/min — ABNORMAL LOW (ref >60)
Glucose, Bld: 103 mg/dL — ABNORMAL HIGH (ref 70–99)
Potassium: 5.2 mmol/L — ABNORMAL HIGH (ref 3.5–5.1)
Sodium: 139 mmol/L (ref 135–145)
Total Bilirubin: <0.2 mg/dL — ABNORMAL LOW (ref 0.3–1.2)
Total Protein: 6.2 g/dL — ABNORMAL LOW (ref 6.5–8.1)

## 2018-04-28 NOTE — Telephone Encounter (Signed)
Scheduled appt 01/30 los.  Printed calendar and avs, per patient request.  Gave patient the number to central radiology so she can call and schedule her CT scan the same day as her lab appt.

## 2018-04-28 NOTE — Progress Notes (Signed)
[place] [phone]   [phone]  CONSULT NOTE  REFERRING PHYSICIAN: Dr. Modesto Charon.  REASON FOR CONSULTATION:  68 years old white female recently diagnosed with lung cancer.  HPI Maria Arroyo is a 68 y.o. female with past medical history significant for COPD, dyslipidemia, depression, coronary artery disease, cervical disc disease, osteoarthritis, gout, hypertension and long history of smoking.  The patient was seen by her primary care physician complaining of weight loss of around 30 pounds over 3 months.  She was also not feeling well.  She had chest x-ray on 11/05/2017 and that showed right lower lobe infiltrate.  She was treated with a course of antibiotics with no significant improvement in her condition.  On February 02, 2018 she had CT scan of the chest, abdomen and pelvis performed at [place] and that showed a spiculated right lower lobe nodule.  The patient was referred to Dr. Ward Givens and a PET scan was performed on February 14, 2018 and showed hypermetabolic right lower lobe nodule measuring 2.5 x 2.2 cm. The patient was referred to Dr. Roxan Hockey and on April 04, 2018 she underwent right VATS with right lower lobectomy and mediastinal lymph node dissection. 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  The dissected lymph nodes were negative for malignancy. Dr. Roxan Hockey kindly referred the patient to me today for evaluation and recommendation regarding adjuvant therapy and monitoring. When seen today the patient continues to complain of back pain as well as shortness of breath with exertion and mild cough but no significant chest pain or hemoptysis.  She denied having any significant weight loss or night sweats.  She has no nausea, vomiting, diarrhea or constipation.  She has no headache or visual changes. Family  history significant for mother died from brain aneurysm and father died from stroke. The patient is married and has 2 children a son and daughter.  She is a housewife.  She has a history of smoking up to 3 packs/day for around 42 years and unfortunately she continues to smoke 1.5 pack/day.  She has no history of alcohol or drug abuse.  HPI  Past Medical History:  Diagnosis Date  . Cervical disc disease   . COPD (chronic obstructive pulmonary disease) ([place])   . Coronary artery disease   . Depression   . Gout    R foot and knee  . Hyperlipidemia   . Hypertension   . Neuropathy   . OA (osteoarthritis)   . Tobacco abuse     Past Surgical History:  Procedure Laterality Date  . ABDOMINAL HYSTERECTOMY    . ANTERIOR CERVICAL DECOMP/DISCECTOMY FUSION  02/06/2011   Procedure: ANTERIOR CERVICAL DECOMPRESSION/DISCECTOMY FUSION 2 LEVELS;  Surgeon: Winfield Cunas;  Location: [facility];  Service: Neurosurgery;  Laterality: N/A;  Anterior Cervical Four-Five/Five-Six Decompression and Fusion with Plating and Bonegraft  . BLADDER SURGERY    . CARDIAC CATHETERIZATION  04/09/2009   EF 60%  . CARDIAC CATHETERIZATION  03/24/2001   EF 55%  . CARDIAC CATHETERIZATION  01/06/2001   EF 65%  . CHOLECYSTECTOMY    . CORONARY STENT PLACEMENT  03/2009   STENTING OF THE PROXIMAL TO MID RIGHT CORONARY  . LEFT HEART CATHETERIZATION WITH CORONARY ANGIOGRAM N/A 12/21/2011   Procedure: LEFT HEART CATHETERIZATION WITH CORONARY ANGIOGRAM;  Surgeon: Peter M Martinique, MD;  Location: [facility] CATH LAB;  Service: Cardiovascular;  Laterality: N/A;  .  NODE DISSECTION N/A 04/04/2018   Procedure: NODE DISSECTION;  Surgeon: Melrose Nakayama, MD;  Location: [place];  Service: Thoracic;  Laterality: N/A;  . OTHER SURGICAL HISTORY  12/2014   R foot cyst removal  . VIDEO ASSISTED THORACOSCOPY (VATS)/ LOBECTOMY Right 04/04/2018   Procedure: VIDEO ASSISTED THORACOSCOPY (VATS)/RIGHT LOWER LOBECTOMY;  Surgeon: Melrose Nakayama, MD;   Location[facility] OR;  Service: Thoracic;  Laterality: Right;    Family History  Problem Relation Age of Onset  . Aneurysm Mother   . Heart attack Father   . Heart failure Father   . Stroke Sister   . Heart attack Brother     Social History Social History   Tobacco Use  . Smoking status: Current Every Day Smoker    Packs/day: 2.00    Years: 35.00    Pack years: 70.00    Types: Cigarettes  . Smokeless tobacco: Never Used  . Tobacco comment: 1-1.5 ppd (10/02/13), 03/17/18 2 PPD  Substance Use Topics  . Alcohol use: No    Alcohol/week: 0.0 standard drinks  . Drug use: No    Allergies  Allergen Reactions  . Ace Inhibitors Other (See Comments)    Worsening renal function   . Codeine Nausea Only  . Codeine-Guaifenesin [Guaifenesin-Codeine] Nausea Only and Other (See Comments)    Some nausea--but ok with percocet    Current Outpatient Medications  Medication Sig Dispense Refill  . acetaminophen (TYLENOL) 500 MG tablet Take 2 tablets (1,000 mg total) by mouth every 6 (six) hours. 30 tablet 0  . allopurinol (ZYLOPRIM) 100 MG tablet Take 200 mg by mouth daily.    [name] amiodarone (PACERONE) 200 MG tablet Take 2 tabs (400 mg)  twice daily for 14 days, then 1 tab (200 mg) daily thereafter. 90 tablet 1  . apixaban (ELIQUIS) 5 MG TABS tablet Take 1 tablet (5 mg total) by mouth 2 (two) times daily. 60 tablet 1  . aspirin 81 MG tablet Take 81 mg by mouth daily.    . bisoprolol (ZEBETA) 5 MG tablet Take 0.5 tablets (2.5 mg total) by mouth daily. 30 tablet 11  . busPIRone (BUSPAR) 15 MG tablet Take 15 mg by mouth 2 (two) times daily.    . Calcium Carbonate-Vitamin D (CALCIUM 600 + D PO) Take 1 tablet by mouth daily.     . carisoprodol (SOMA) 350 MG tablet Take 1 tablet by mouth 4 (four) times daily as needed for muscle spasms.    . Cholecalciferol (VITAMIN D) 2000 UNITS tablet Take 4,000 Units by mouth daily.     . furosemide (LASIX) 20 MG tablet Take 1 tablet (20 mg total) by mouth daily. Take  for 2 DAYS then stop 30 tablet 2  . gabapentin (NEURONTIN) 600 MG tablet TAKE 1 TABLET(600 MG) BY MOUTH FOUR TIMES DAILY AT 8 AM AND AT 1 PM AND AT 6 PM AND AT 10 PM 120 tablet 11  . losartan (COZAAR) 25 MG tablet Take 1 tablet (25 mg total) by mouth daily. 30 tablet 1  . Multiple Vitamin (MULTIVITAMIN) tablet Take 1 tablet by mouth daily.     . nitroGLYCERIN (NITROSTAT) 0.4 MG SL tablet Place 1 tablet (0.4 mg total) under the tongue every 5 (five) minutes as needed. For chest pain (Patient taking differently: Place 0.4 mg under the tongue every 5 (five) minutes as needed for chest pain. ) 100 tablet 0  . oxyCODONE-acetaminophen (PERCOCET) 10-325 MG tablet Take 1 tablet by mouth 4 (four) times daily  as needed for pain.    [name] PROVENTIL HFA 108 (90 Base) MCG/ACT inhaler Inhale 1-2 puffs into the lungs every 4 (four) hours as needed for wheezing or shortness of breath.   4  . venlafaxine XR (EFFEXOR-XR) 75 MG 24 hr capsule Take 75 mg by mouth 2 (two) times daily.   0  . rosuvastatin (CRESTOR) 40 MG tablet Take 1 tablet (40 mg total) by mouth daily. NEED OV. 90 tablet 0   No current facility-administered medications for this visit.     Review of Systems  Constitutional: positive for fatigue Eyes: negative Ears, nose, mouth, throat, and face: negative Respiratory: positive for cough and dyspnea on exertion Cardiovascular: negative Gastrointestinal: negative Genitourinary:negative Integument/breast: negative Hematologic/lymphatic: negative Musculoskeletal:negative Neurological: negative Behavioral/Psych: negative Endocrine: negative Allergic/Immunologic: negative  Physical Exam  PIR:JJOAC, healthy, no distress, well nourished and well developed SKIN: skin color, texture, turgor are normal, no rashes or significant lesions HEAD: Normocephalic, No masses, lesions, tenderness or abnormalities EYES: normal, PERRLA, Conjunctiva are pink and non-injected EARS: External ears normal, Canals  clear OROPHARYNX:no exudate, no erythema and lips, buccal mucosa, and tongue normal  NECK: supple, no adenopathy, no JVD LYMPH:  no palpable lymphadenopathy, no hepatosplenomegaly BREAST:not examined LUNGS: clear to auscultation , and palpation HEART: regular rate & rhythm, no murmurs and no gallops ABDOMEN:abdomen soft, non-tender, normal bowel sounds and no masses or organomegaly BACK: No CVA tenderness, Range of motion is normal EXTREMITIES:no joint deformities, effusion, or inflammation, no edema  NEURO: alert & oriented x 3 with fluent speech, no focal motor/sensory deficits  PERFORMANCE STATUS: ECOG 1  LABORATORY DATA: Lab Results  Component Value Date   WBC 9.1 04/28/2018   HGB 10.8 (L) 04/28/2018   HCT 35.9 (L) 04/28/2018   MCV 94.5 04/28/2018   PLT 279 04/28/2018      Chemistry      Component Value Date/Time   NA 134 (L) 04/08/2018 0253   K 4.5 04/08/2018 0253   CL 99 04/08/2018 0253   CO2 25 04/08/2018 0253   BUN 22 04/08/2018 0253   CREATININE 1.30 (H) 04/08/2018 0253   CREATININE 0.86 12/18/2011 1627      Component Value Date/Time   CALCIUM 8.0 (L) 04/08/2018 0253   ALKPHOS 79 04/06/2018 0305   AST 11 (L) 04/06/2018 0305   ALT 9 04/06/2018 0305   BILITOT 0.4 04/06/2018 0305       RADIOGRAPHIC STUDIES: Dg Chest 2 View  Result Date: 04/19/2018 CLINICAL DATA:  Follow-up RIGHT VATS and resection of LEFT LOWER lobe squamous cell carcinoma EXAM: CHEST - 2 VIEW COMPARISON:  04/09/2018 and prior exams FINDINGS: Cardiomediastinal silhouette is unchanged. Improved RIGHT lung aeration noted with decreased RIGHT LOWER lung atelectasis. There is no evidence of pneumothorax. There may be a small RIGHT pleural effusion present. The LEFT lung is clear. No acute bony abnormalities are identified. IMPRESSION: Improved RIGHT lung aeration with decreased RIGHT LOWER lung atelectasis. No pneumothorax. Electronically Signed   By: Margarette Canada M.D.   On: 04/19/2018 09:39   Dg  Chest 2 View  Result Date: 04/09/2018 CLINICAL DATA:  S/P lobectomy of lung EXAM: CHEST - 2 VIEW COMPARISON:  the previous day's study FINDINGS: Persistent dense opacity at the right lung base with worsening right mid lung interstitial and airspace opacities. Left lung remains clear. Persistent volume loss with mediastinal shift towards the right. Heart size of part to assess due to adjacent opacities. Cannot exclude right pleural effusion. Some decrease in the right lateral  subcutaneous emphysema. No pneumothorax. Cervical fixation hardware. Anterior vertebral endplate spurring at multiple levels in the lower thoracic spine. IMPRESSION: Persistent right lower lung consolidation/atelectasis with worsening right mid lung infiltrate or atelectasis. Electronically Signed   By: Lucrezia Europe M.D.   On: 04/09/2018 09:16   Dg Chest 2 View  Result Date: 04/08/2018 CLINICAL DATA:  Status post right lobectomy. EXAM: CHEST - 2 VIEW COMPARISON:  04/07/2018 FINDINGS: Heart size is normal. Postsurgical change with volume loss in the right lower lung is again identified compatible with right lower lobectomy. No significant pneumothorax identified. Mild interstitial edema appears new from previous exam. Extensive right chest wall gas is again noted and is unchanged from previous study. IMPRESSION: 1. Mild pulmonary edema. 2. Postop change with volume loss involving the right lung 3. Similar right chest wall soft tissue emphysema. Electronically Signed   By: Kerby Moors M.D.   On: 04/08/2018 08:55   Dg Chest 2 View  Result Date: 04/04/2018 CLINICAL DATA:  Right lower lung nodule. EXAM: CHEST - 2 VIEW COMPARISON:  CT 02/14/2018 FINDINGS: Mild cardiomegaly. Aortic atherosclerosis. The left lung is clear. 4 cm mass in posterior inferior right lower lobe, consistent with the known hypermetabolic lesion on previous PET CT. On that CT, it measured only about 2.5 cm in diameter. No effusion. No other pulmonary finding. Ordinary  degenerative changes affect the spine IMPRESSION: 1. 4 cm mass in the posterior inferior right lower lobe, consistent with the known hypermetabolic lesion on previous PET CT. 2. Mild cardiomegaly. 3. Aortic atherosclerosis. Electronically Signed   By: Nelson Chimes M.D.   On: 04/04/2018 06:30   Dg Chest Port 1 View  Result Date: 04/07/2018 CLINICAL DATA:  Prior lung surgery. EXAM: PORTABLE CHEST 1 VIEW COMPARISON:  04/06/2018. FINDINGS: Right IJ line and right chest tube in stable position. No pneumothorax noted on today's exam. No prominent pleural effusion. Postsurgical changes right lung. Mild bibasilar atelectasis/infiltrates again noted. Stable cardiomegaly. Right chest wall subcutaneous emphysema again noted. Prior cervical spine fusion. IMPRESSION: 1. Right IJ line and right chest tube in stable position. No pneumothorax on today's exam. Right chest wall subcutaneous emphysema again noted. 2. Postsurgical changes right lung. Bibasilar atelectasis again noted. Electronically Signed   By: Marcello Moores  Register   On: 04/07/2018 06:51   Dg Chest Port 1 View  Result Date: 04/06/2018 CLINICAL DATA:  Postoperative lobectomy with chest tube present EXAM: PORTABLE CHEST 1 VIEW COMPARISON:  April 05, 2018 FINDINGS: Central catheter tip is in the superior vena cava near the cavoatrial junction, stable. Chest tube position on the right is stable. The medial basilar pneumothorax on the right is much smaller compared to 1 day prior. There is extensive subcutaneous air on the right, stable. There is atelectatic change in the left lower lobe. There is also mild atelectasis in the right base. Lungs elsewhere appear clear. Heart is upper normal in size with pulmonary vascularity normal. No adenopathy. Postoperative changes noted in the lower cervical spine. There are foci of carotid artery calcification bilaterally. IMPRESSION: Tube and catheter positions as described. Rather minimal residual inferobasilar pneumothorax  medially on the right. No new focus of pneumothorax evident. Extensive subcutaneous air remains on the right. Atelectatic change left base noted. Earliest changes of pneumonia in this area can not be excluded. There is mild right base atelectasis. There is stable cardiomegaly. There are foci of carotid artery calcification bilaterally. Electronically Signed   By: Lowella Grip III M.D.   On: 04/06/2018 07:09  Dg Chest Port 1 View  Addendum Date: 04/05/2018   ADDENDUM REPORT: 04/05/2018 10:07 ADDENDUM: There is faint lucency at the right lung base suggesting a small basilar pneumothorax with a right-sided chest tube in satisfactory position. Electronically Signed   By: Kathreen Devoid   On: 04/05/2018 10:07   Result Date: 04/05/2018 CLINICAL DATA:  Status post lobectomy, right VATS EXAM: PORTABLE CHEST 1 VIEW COMPARISON:  04/04/2018 FINDINGS: Interval right lower lobectomy. Right-sided chest tube in satisfactory position. No pneumothorax. Left lung is clear. Stable cardiomediastinal silhouette. Right IJ central venous catheter with the tip projecting over the cavoatrial junction. No acute osseous abnormality. IMPRESSION: Interval right lower lobectomy. Right-sided chest tube in satisfactory position. No pneumothorax. Electronically Signed: By: Kathreen Devoid On: 04/04/2018 11:32   Dg Chest Port 1 View  Result Date: 04/05/2018 CLINICAL DATA:  Status post lobectomy.  Chest tube. EXAM: PORTABLE CHEST 1 VIEW COMPARISON:  04/04/2018. FINDINGS: Mediastinum hilar structures normal. Stable cardiomegaly. Right IJ line and right chest tube in stable position. Right lower lobectomy. Right base pneumothorax. Mild left base subsegmental atelectasis. No pleural effusion or pneumothorax. Right chest wall subcutaneous emphysema. No pleural effusion or pneumothorax. IMPRESSION: 1.  Lines and tubes including right chest tube in stable position. 2. Right lower lobectomy. Right base pneumothorax noted. Diffuse right chest wall  subcutaneous emphysema noted on today's exam. Critical Value/emergent results were called by telephone at the time of interpretation on 04/05/2018 at 8:10 am to nurse Alyse Low, who verbally acknowledged these results. Electronically Signed   By: Marcello Moores  Register   On: 04/05/2018 08:12   Dg Chest Port 1v Same Day  Result Date: 04/07/2018 CLINICAL DATA:  Right-sided chest tube removal. Recent right lower lobectomy. EXAM: PORTABLE CHEST 1 VIEW COMPARISON:  Chest x-ray from same day at 6:11 a.m. FINDINGS: The patient is significantly rotated to the right, limiting evaluation. Interval removal of the right-sided chest tube and right internal jugular central venous catheter. No pneumothorax. Postsurgical changes at the right lung base again noted with unchanged volume loss in the right hemithorax. Mild atelectasis at the right lung base is unchanged. The left lung is clear. No pleural effusion. Stable mild cardiomegaly. No acute osseous abnormality. Unchanged right chest wall subcutaneous emphysema. IMPRESSION: 1. Limited study due to rightward rotation. Interval removal of the right chest tube. No pneumothorax. 2. Stable postsurgical changes and volume loss in the right hemithorax. Unchanged right basilar atelectasis. Electronically Signed   By: Titus Dubin M.D.   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 Roxan Hockey.   PLAN: I had a lengthy discussion with the patient today about her current disease stage, prognosis and treatment options. I reviewed her imaging studies as well as the pathology report. I explained to the patient that there is no survival benefit for adjuvant systemic chemotherapy for patient with a stage  Ib if the tumor size is less than 4.0 cm. I recommended for the patient to continue on observation with repeat CT scan of the chest in 6 months. For smoking cessation I strongly encouraged the patient to quit smoking and provided her with handout and recommendation regarding smoking cessation. I will see the patient back for follow-up visit in 6 months  with a scan. She was advised to call immediately if she has any other concerning symptoms in the interval. The patient voices understanding of current disease status and treatment options and is in agreement with the current care plan.  All questions were answered. The patient knows to call the clinic with any problems, questions or concerns. We can certainly see the patient much sooner if necessary.  Thank you so much for allowing me to participate in the care of Maria Arroyo. I will continue to follow up the patient with you and assist in her care.  I spent 40 minutes counseling the patient face to face. The total time spent in the appointment was 60 minutes.  Disclaimer: This note was dictated with voice recognition software. Similar sounding words can inadvertently be transcribed and may not be corrected upon review.   Eilleen Kempf April 28, 2018, 3:20 PM

## 2018-04-29 ENCOUNTER — Other Ambulatory Visit: Payer: Self-pay

## 2018-04-29 DIAGNOSIS — I2510 Atherosclerotic heart disease of native coronary artery without angina pectoris: Secondary | ICD-10-CM

## 2018-04-29 MED ORDER — ROSUVASTATIN CALCIUM 40 MG PO TABS: 40.0000 mg | ORAL_TABLET | Freq: Every day | ORAL | 3 refills | Status: DC

## 2018-04-29 NOTE — Telephone Encounter (Signed)
Rx(s) sent to pharmacy electronically.  

## 2018-05-03 ENCOUNTER — Ambulatory Visit (INDEPENDENT_AMBULATORY_CARE_PROVIDER_SITE_OTHER): Payer: Medicare Other | Admitting: Physician Assistant

## 2018-05-03 ENCOUNTER — Encounter: Payer: Self-pay | Admitting: Physician Assistant

## 2018-05-03 ENCOUNTER — Other Ambulatory Visit: Payer: Self-pay | Admitting: Physician Assistant

## 2018-05-03 VITALS — BP 154/84 | HR 58 | Ht 61.0 in | Wt 159.0 lb

## 2018-05-03 DIAGNOSIS — E785 Hyperlipidemia, unspecified: Secondary | ICD-10-CM

## 2018-05-03 DIAGNOSIS — R0989 Other specified symptoms and signs involving the circulatory and respiratory systems: Secondary | ICD-10-CM

## 2018-05-03 DIAGNOSIS — I2510 Atherosclerotic heart disease of native coronary artery without angina pectoris: Secondary | ICD-10-CM | POA: Diagnosis not present

## 2018-05-03 DIAGNOSIS — C3490 Malignant neoplasm of unspecified part of unspecified bronchus or lung: Secondary | ICD-10-CM

## 2018-05-03 DIAGNOSIS — J449 Chronic obstructive pulmonary disease, unspecified: Secondary | ICD-10-CM

## 2018-05-03 DIAGNOSIS — I480 Paroxysmal atrial fibrillation: Secondary | ICD-10-CM

## 2018-05-03 DIAGNOSIS — I10 Essential (primary) hypertension: Secondary | ICD-10-CM

## 2018-05-03 DIAGNOSIS — R0689 Other abnormalities of breathing: Secondary | ICD-10-CM

## 2018-05-03 MED ORDER — WARFARIN SODIUM 5 MG PO TABS: ORAL_TABLET | ORAL | 1 refills | Status: DC

## 2018-05-03 NOTE — Patient Instructions (Addendum)
Medication Instructions:  START THURSDAY 05-05-2018 TAKING 5 MG WARFARIN EVERY NIGHT Your last does of ELIQUIS of be completed on Saturday night 05-07-2018 or Sunday morning 05-08-2018  If you need a refill on your cardiac medications before your next appointment, please call your pharmacy.   Lab work: You are having a blood work done today:  BMET  If you have labs (blood work) drawn today and your tests are completely normal, you will receive your results only by: [name] MyChart Message (if you have MyChart) OR . A paper copy in the mail If you have any lab test that is abnormal or we need to change your treatment, we will call you to review the results.  Testing/Procedures:  Your physician is ordering a Chest X-Ray (2 view) at [place]. [address]   Follow-Up: At [facility], you and your health needs are our priority.  As part of our continuing mission to provide you with exceptional heart care, we have created designated Provider Care Teams.  These Care Teams include your primary Cardiologist (physician) and Advanced Practice Providers (APPs -  Physician Assistants and Nurse Practitioners) who all work together to provide you with the care you need, when you need it. You will need a follow up appointment in 2-3 months.  Please call our office 2 months in advance to schedule this appointment.  You may see Peter Martinique, MD or one of the following Advanced Practice Providers on your designated Care Team: [place], [place] . Fabian Sharp, PA-C  Any Other Special Instructions Will Be Listed Below (If Applicable).

## 2018-05-03 NOTE — Progress Notes (Signed)
Cardiology Office Note    Date:  05/05/2018   ID:  Tonika, Eden 10-15-1950, MRN [id number]  PCP:  [name], MD  Cardiologist:  Dr. Martinique  Chief Complaint  Patient presents with  . Follow-up    seen for Dr. Martinique.     History of Present Illness:  Maria Arroyo is a 68 y.o. female with PMH of HTN, HLD, COPD, tobacco abuse, lung nodule and CAD.  Patient underwent stenting of mid RCA in December 1999.  She returned in October 2002 and had stenting of proximal RCA.  In January 2011, she developed a high-grade stenosis in between the prior stents and that this was stented with additional drug-eluting stent.  She required repeat cardiac catheterization in September 2013, this revealed nonobstructive CAD.  Since then, she has been maintained on dual antiplatelet therapy.  Her last office visit with Dr. Martinique was on 01/13/2017 at which time she was doing well.  Due to significant unexplained weight loss, she underwent work-up for occult malignancy.  Chest x-ray showed a question of right lower lobe lung nodule.  PET scan showed hypermetabolic mass in RLL.  Patient was referred to Dr. Roxan Hockey for further evaluation.  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  Her Plavix was held prior to the procedure.  Postprocedure, her hospital course was complicated by occurrence of atrial fibrillation requiring IV amiodarone.    She was initially placed on amiodarone and converted to sinus rhythm.  However later had recurrence of atrial fibrillation during the same hospitalization.  She was started on Eliquis and discharged with downward titration of amiodarone.  Her amiodarone will be 400 mg twice daily for 2 weeks then 200 mg daily thereafter.  I last saw the patient on 04/22/2018, at which time she already went down to 200 mg daily of amiodarone by 1/21.  Her heart rate was bradycardic in the 40s.  I further  decreased her bisoprolol to 2.5 mg daily.  Due to presence of edema after the surgery, I gave her as needed dose of Lasix.  Patient presents today for cardiology office visit, her O2 saturation is 95%.  She says she is on oxygen as needed at home and she did not wear any oxygen this morning.  On physical exam, she has diffusely diminished breath sound all over consistent with history of COPD, however also has markedly diminished breath sound in the right base of the lung.  Some of this may be related to the recent VATS procedure.  I will obtain a chest x-ray.  Recent lab work last week also showed potassium of 5.2, the only medication she is on that can raise the potassium level is the losartan.  I will obtain a basic metabolic panel.  Otherwise I discussed with her the risk and benefit of Eliquis, Xarelto and Coumadin.  We fill out the medication assistance form for the patient during the last office visit, her daughter called the drug company, and was told that she did not qualify for medication assistance.  She says the medication caused close to $400 before her deductible, even after her deductible is met, it was still cost $200 for her.  She is asking for alternative therapy, at this time the only alternative left is Coumadin.  I discussed the case with our clinical pharmacist who will start her on Coumadin.   Past Medical History:  Diagnosis Date  . Cervical disc disease   .  COPD (chronic obstructive pulmonary disease) ([place])   . Coronary artery disease   . Depression   . Gout    R foot and knee  . Hyperlipidemia   . Hypertension   . Neuropathy   . OA (osteoarthritis)   . Tobacco abuse     Past Surgical History:  Procedure Laterality Date  . ABDOMINAL HYSTERECTOMY    . ANTERIOR CERVICAL DECOMP/DISCECTOMY FUSION  02/06/2011   Procedure: ANTERIOR CERVICAL DECOMPRESSION/DISCECTOMY FUSION 2 LEVELS;  Surgeon: Winfield Cunas;  Location: [facility];  Service: Neurosurgery;  Laterality: N/A;   Anterior Cervical Four-Five/Five-Six Decompression and Fusion with Plating and Bonegraft  . BLADDER SURGERY    . CARDIAC CATHETERIZATION  04/09/2009   EF 60%  . CARDIAC CATHETERIZATION  03/24/2001   EF 55%  . CARDIAC CATHETERIZATION  01/06/2001   EF 65%  . CHOLECYSTECTOMY    . CORONARY STENT PLACEMENT  03/2009   STENTING OF THE PROXIMAL TO MID RIGHT CORONARY  . LEFT HEART CATHETERIZATION WITH CORONARY ANGIOGRAM N/A 12/21/2011   Procedure: LEFT HEART CATHETERIZATION WITH CORONARY ANGIOGRAM;  Surgeon: Peter M Martinique, MD;  Location: [facility] CATH LAB;  Service: Cardiovascular;  Laterality: N/A;  . NODE DISSECTION N/A 04/04/2018   Procedure: NODE DISSECTION;  Surgeon: Melrose Nakayama, MD;  Location: [place];  Service: Thoracic;  Laterality: N/A;  . OTHER SURGICAL HISTORY  12/2014   R foot cyst removal  . VIDEO ASSISTED THORACOSCOPY (VATS)/ LOBECTOMY Right 04/04/2018   Procedure: VIDEO ASSISTED THORACOSCOPY (VATS)/RIGHT LOWER LOBECTOMY;  Surgeon: Melrose Nakayama, MD;  Location: [facility];  Service: Thoracic;  Laterality: Right;    Current Medications: Outpatient Medications Prior to Visit  Medication Sig Dispense Refill  . acetaminophen (TYLENOL) 500 MG tablet Take 2 tablets (1,000 mg total) by mouth every 6 (six) hours. 30 tablet 0  . allopurinol (ZYLOPRIM) 100 MG tablet Take 200 mg by mouth daily.    [name] amiodarone (PACERONE) 200 MG tablet Take 2 tabs (400 mg)  twice daily for 14 days, then 1 tab (200 mg) daily thereafter. 90 tablet 1  . apixaban (ELIQUIS) 5 MG TABS tablet Take 1 tablet (5 mg total) by mouth 2 (two) times daily. 60 tablet 1  . aspirin 81 MG tablet Take 81 mg by mouth daily.    . bisoprolol (ZEBETA) 5 MG tablet Take 0.5 tablets (2.5 mg total) by mouth daily. 30 tablet 11  . busPIRone (BUSPAR) 15 MG tablet Take 15 mg by mouth 2 (two) times daily.    . Calcium Carbonate-Vitamin D (CALCIUM 600 + D PO) Take 1 tablet by mouth daily.     . carisoprodol (SOMA) 350 MG tablet Take 1 tablet  by mouth 4 (four) times daily as needed for muscle spasms.    . Cholecalciferol (VITAMIN D) 2000 UNITS tablet Take 4,000 Units by mouth daily.     [name] gabapentin (NEURONTIN) 600 MG tablet TAKE 1 TABLET(600 MG) BY MOUTH FOUR TIMES DAILY AT 8 AM AND AT 1 PM AND AT 6 PM AND AT 10 PM 120 tablet 11  . Multiple Vitamin (MULTIVITAMIN) tablet Take 1 tablet by mouth daily.     . nitroGLYCERIN (NITROSTAT) 0.4 MG SL tablet Place 1 tablet (0.4 mg total) under the tongue every 5 (five) minutes as needed. For chest pain (Patient taking differently: Place 0.4 mg under the tongue every 5 (five) minutes as needed for chest pain. ) 100 tablet 0  . oxyCODONE-acetaminophen (PERCOCET) 10-325 MG tablet Take 1 tablet by  mouth 4 (four) times daily as needed for pain.    [name] PROVENTIL HFA 108 (90 Base) MCG/ACT inhaler Inhale 1-2 puffs into the lungs every 4 (four) hours as needed for wheezing or shortness of breath.   4  . rosuvastatin (CRESTOR) 40 MG tablet Take 1 tablet (40 mg total) by mouth daily. 90 tablet 3  . venlafaxine XR (EFFEXOR-XR) 75 MG 24 hr capsule Take 75 mg by mouth 2 (two) times daily.   0  . losartan (COZAAR) 25 MG tablet Take 1 tablet (25 mg total) by mouth daily. 30 tablet 1  . furosemide (LASIX) 20 MG tablet Take 1 tablet (20 mg total) by mouth daily. Take for 2 DAYS then stop (Patient not taking: Reported on 05/03/2018) 30 tablet 2   No facility-administered medications prior to visit.      Allergies:   Ace inhibitors; Codeine; and Codeine-guaifenesin [guaifenesin-codeine]   Social History   Socioeconomic History  . Marital status: Married    Spouse name: Not on file  . Number of children: 2  . Years of education: 10  . Highest education level: Not on file  Occupational History  . Occupation: housewife    Employer: UNEMPLOYED  Social Needs  . Financial resource strain: Not on file  . Food insecurity:    Worry: Not on file    Inability: Not on file  . Transportation needs:    Medical: Not on  file    Non-medical: Not on file  Tobacco Use  . Smoking status: Current Every Day Smoker    Packs/day: 2.00    Years: 35.00    Pack years: 70.00    Types: Cigarettes  . Smokeless tobacco: Never Used  . Tobacco comment: 1-1.5 ppd (10/02/13), 03/17/18 2 PPD  Substance and Sexual Activity  . Alcohol use: No    Alcohol/week: 0.0 standard drinks  . Drug use: No  . Sexual activity: Not on file  Lifestyle  . Physical activity:    Days per week: Not on file    Minutes per session: Not on file  . Stress: Not on file  Relationships  . Social connections:    Talks on phone: Not on file    Gets together: Not on file    Attends religious service: Not on file    Active member of club or organization: Not on file    Attends meetings of clubs or organizations: Not on file    Relationship status: Not on file  Other Topics Concern  . 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   The patient has a 10th grade education.   The patient is left-handed.   The patient has two children.   Patient drinks three cups of soda daily.         Family History:  The patient's family history includes Aneurysm in her mother; Heart attack in her brother and father; Heart failure in her father; Stroke in her sister.   ROS:   Please see the history of present illness.    ROS All other systems reviewed and are negative.   PHYSICAL EXAM:   VS:  BP (!) 154/84   Pulse (!) 58   Ht '5\' 1"'  (1.549 m)   Wt 159 lb (72.1 kg)   SpO2 (!) 84%   BMI 30.04 kg/m    GEN: Well nourished, well developed, in no acute distress  HEENT: normal  Neck: no JVD,  carotid bruits, or masses Cardiac: RRR; no murmurs, rubs, or gallops,no edema  Respiratory: Decreased breath sounds in right base GI: soft, nontender, nondistended, + BS MS: no deformity or atrophy  Skin: warm and dry, no rash Neuro:  Alert and Oriented x 3, Strength and sensation are intact Psych: euthymic  mood, full affect  Wt Readings from Last 3 Encounters:  05/03/18 159 lb (72.1 kg)  04/28/18 162 lb (73.5 kg)  04/22/18 166 lb 3.2 oz (75.4 kg)      Studies/Labs Reviewed:   EKG:  EKG is not ordered today.    Recent Labs: 04/08/2018: Magnesium 2.1; TSH 0.633 04/28/2018: ALT 14; Hemoglobin 10.8; Platelet Count 279 05/03/2018: BUN 20; Creatinine, Ser 1.28; Potassium 5.4; Sodium 143   Lipid Panel No results found for: CHOL, TRIG, HDL, CHOLHDL, VLDL, LDLCALC, LDLDIRECT  Additional studies/ records that were reviewed today include:   Echo 04/07/2018 LV EF: 50% -   55%  ------------------------------------------------------------------- Indications:      Atrial fibrillation - 427.31.  ------------------------------------------------------------------- Study Conclusions  - Left ventricle: EF hard to judge due to rapid afib. The cavity   size was normal. Wall thickness was normal. Systolic function was   normal. The estimated ejection fraction was in the range of 50%   to 55%. The study is not technically sufficient to allow   evaluation of LV diastolic function. - Aortic valve: Sclerosis without stenosis. Valve area (VTI): 2.07   cm^2. Valve area (Vmean): 2.26 cm^2. - Right ventricle: The cavity size was mildly dilated.    ASSESSMENT:    1. PAF (paroxysmal atrial fibrillation) ([place])   2. Hyperlipidemia, unspecified hyperlipidemia type   3. Decreased breath sounds at right lung base   4. Coronary artery disease involving native coronary artery of native heart without angina pectoris   5. Essential hypertension   6. Chronic obstructive pulmonary disease, unspecified COPD type ([place])   7. Malignant neoplasm of lung, unspecified laterality, unspecified part of lung ([place])      PLAN:  In order of problems listed above:  1. PAF: Unable to afford Eliquis.  I discussed the case with our clinical pharmacist, will plan to transition the patient to Coumadin.  Continue rate control  therapy  2. Decreased breath sounds in right base: Likely related to recent VATS procedure.  Obtain chest x-ray.  Initial O2 saturation was low, however oxygen level quickly came up  3. Hypertension: Blood pressure stable on current therapy  4. Hyperlipidemia: Continue Crestor 40 mg daily  5. COPD: No obvious sign of exacerbation  6. Lung cancer: Recently underwent VATS procedure on 04/04/2018.     Medication Adjustments/Labs and Tests Ordered: Current medicines are reviewed at length with the patient today.  Concerns regarding medicines are outlined above.  Medication changes, Labs and Tests ordered today are listed in the Patient Instructions below. Patient Instructions  Medication Instructions:  START THURSDAY 05-05-2018 TAKING 5 MG WARFARIN EVERY NIGHT Your last does of ELIQUIS of be completed on Saturday night 05-07-2018 or Sunday morning 05-08-2018  If you need a refill on your cardiac medications before your next appointment, please call your pharmacy.   Lab work: You are having a blood work done today:  BMET  If you have labs (blood work) drawn today and your tests are completely normal, you will receive your results only by: [name] MyChart Message (if you have MyChart) OR . A paper copy in the mail If you have any lab test that is abnormal or we need  to change your treatment, we will call you to review the results.  Testing/Procedures:  Your physician is ordering a Chest X-Ray (2 view) at [place]. [address]   Follow-Up: At [facility], you and your health needs are our priority.  As part of our continuing mission to provide you with exceptional heart care, we have created designated Provider Care Teams.  These Care Teams include your primary Cardiologist (physician) and Advanced Practice Providers (APPs -  Physician Assistants and Nurse Practitioners) who all work together to provide you with the care you need, when you need it. You will need a follow up  appointment in 2-3 months.  Please call our office 2 months in advance to schedule this appointment.  You may see Peter Martinique, MD or one of the following Advanced Practice Providers on your designated Care Team: [place], [place] . Fabian Sharp, PA-C  Any Other Special Instructions Will Be Listed Below (If Applicable).         Hilbert Corrigan, [place]  05/05/2018 11:51 PM    [facility] [address], [place], [place]  [address] Phone: [phone]; Fax: [phone]

## 2018-05-04 LAB — BASIC METABOLIC PANEL
BUN/Creatinine Ratio: 16 (ref 12–28)
BUN: 20 mg/dL (ref 8–27)
CALCIUM: 8.8 mg/dL (ref 8.7–10.3)
CO2: 23 mmol/L (ref 20–29)
Chloride: 107 mmol/L — ABNORMAL HIGH (ref 96–106)
Creatinine, Ser: 1.28 mg/dL — ABNORMAL HIGH (ref 0.57–1.00)
GFR calc Af Amer: 50 mL/min/{1.73_m2} — ABNORMAL LOW (ref >59)
GFR calc non Af Amer: 43 mL/min/{1.73_m2} — ABNORMAL LOW (ref >59)
Glucose: 100 mg/dL — ABNORMAL HIGH (ref 65–99)
Potassium: 5.4 mmol/L — ABNORMAL HIGH (ref 3.5–5.2)
Sodium: 143 mmol/L (ref 134–144)

## 2018-05-04 NOTE — Progress Notes (Signed)
Kidney function still stable, but potassium is continuing to trend up, the only medication that can raise her potassium is the losartan. Stop losartan, switch to amlodipine 5mg  daily

## 2018-05-05 ENCOUNTER — Encounter: Payer: Self-pay | Admitting: Physician Assistant

## 2018-05-05 ENCOUNTER — Telehealth: Payer: Self-pay

## 2018-05-05 ENCOUNTER — Other Ambulatory Visit: Payer: Self-pay

## 2018-05-05 ENCOUNTER — Other Ambulatory Visit: Payer: Self-pay | Admitting: Physician Assistant

## 2018-05-05 MED ORDER — AMLODIPINE BESYLATE 5 MG PO TABS: 5.0000 mg | ORAL_TABLET | Freq: Every day | ORAL | 1 refills | Status: DC

## 2018-05-05 NOTE — Progress Notes (Signed)
Called and left a message for the patient to call back for lab results.

## 2018-05-05 NOTE — Telephone Encounter (Signed)
Left voice message for patient to call back for lab results: Kidney function still stable, but potassium is continuing to trend up, the only medication that can raise her potassium is the losartan. Stop losartan, switch to amlodipine 5mg  daily

## 2018-05-05 NOTE — Progress Notes (Signed)
The patient has been notified of the result and verbalized understanding. New Rx was sent to patients pharmacy.  All questions (if any) were answered. Jacqulynn Cadet, [place] 05/05/2018 2:53 PM

## 2018-05-09 ENCOUNTER — Telehealth: Payer: Self-pay | Admitting: *Deleted

## 2018-05-09 NOTE — Telephone Encounter (Signed)
Oncology Nurse Navigator Documentation  Oncology Nurse Navigator Flowsheets 05/09/2018  Navigator Location [facility]  Navigator Encounter Type Telephone/I called Maria Arroyo today to follow up and see if she had questions about treatment plan.  I was unable to reach but did leave a vm message with my name and phone number to call   Telephone Outgoing Call  Abnormal Finding Date 02/02/2018  Confirmed Diagnosis Date 04/04/2018  Surgery Date 04/04/2018  Treatment Initiated Date 04/04/2018  Treatment Phase Follow-up  Barriers/Navigation Needs No barriers at this time;Education  Education Other  Interventions Education  Education Method Verbal  Acuity Level 1  Time Spent with Patient 15

## 2018-05-10 ENCOUNTER — Ambulatory Visit
Admission: RE | Admit: 2018-05-10 | Discharge: 2018-05-10 | Disposition: A | Discharge status: Home / Self Care | Payer: Medicare Other | Source: Ambulatory Visit | Attending: Physician Assistant | Admitting: Physician Assistant

## 2018-05-11 ENCOUNTER — Telehealth: Payer: Self-pay | Admitting: Pharmacist Clinician (PhC)/ Clinical Pharmacy Specialist

## 2018-05-11 ENCOUNTER — Other Ambulatory Visit: Payer: Self-pay

## 2018-05-11 ENCOUNTER — Telehealth: Payer: Self-pay

## 2018-05-11 ENCOUNTER — Encounter (INDEPENDENT_AMBULATORY_CARE_PROVIDER_SITE_OTHER): Payer: Self-pay

## 2018-05-11 DIAGNOSIS — Z902 Acquired absence of lung [part of]: Secondary | ICD-10-CM

## 2018-05-11 DIAGNOSIS — R0602 Shortness of breath: Secondary | ICD-10-CM

## 2018-05-11 NOTE — Progress Notes (Signed)
The patient has been notified of the result and verbalized understanding.  All questions (if any) were answered. Jacqulynn Cadet, CMA 05/11/2018 3:24 PM

## 2018-05-11 NOTE — Telephone Encounter (Signed)
Left voice message for the patient to call back for chest X-ray results.

## 2018-05-11 NOTE — Progress Notes (Signed)
Left voice message for the patient to call back for chest X-ray results.

## 2018-05-11 NOTE — Telephone Encounter (Signed)
Patient came into office today for transition from Eliquis to warfarin.  Rx had been sent to her pharmacy on 2.4 and patient was given instructions to overlap medications x 3 days.  Today she was to get first INR check.    Unfortunately medication was not at pharmacy when she went to pick up and instead she ended up refilling the Eliquis.  She has decided to try and see if she can afford, as this month should have finished off her annual deductible.  She understands that if she continues to have a high copay she can call and was given a direct phone number to the Coumadin Clinic

## 2018-05-11 NOTE — Telephone Encounter (Signed)
Follow up    Patient is returning call in reference to chest xray results.

## 2018-05-16 ENCOUNTER — Ambulatory Visit
Admission: RE | Admit: 2018-05-16 | Discharge: 2018-05-16 | Disposition: A | Discharge status: Home / Self Care | Payer: Medicare Other | Source: Ambulatory Visit | Attending: Family Medicine | Admitting: Family Medicine

## 2018-05-16 ENCOUNTER — Other Ambulatory Visit: Payer: Self-pay | Admitting: Family Medicine

## 2018-05-16 DIAGNOSIS — R9389 Abnormal findings on diagnostic imaging of other specified body structures: Secondary | ICD-10-CM

## 2018-05-17 ENCOUNTER — Telehealth (HOSPITAL_COMMUNITY): Payer: Self-pay

## 2018-05-17 NOTE — Telephone Encounter (Signed)
Referral received from [facility] for Pulmonary Rehab with diagnosis of shortness of breath and s/p lung lobectomy. Clinical review of pt follow up appt on 2/4 with Meng PA and 1/21 with [facility] heart care office note. Pt appropriate for scheduling for Pulmonary rehab.  Will forward to support staff for scheduling and verification of insurance eligibility/benefits with pt consent.   Joycelyn Man, RN, BSN Cardiac and Pulmonary Rehab Nurse

## 2018-05-20 ENCOUNTER — Other Ambulatory Visit: Payer: Self-pay | Admitting: Family Medicine

## 2018-05-20 DIAGNOSIS — J189 Pneumonia, unspecified organism: Secondary | ICD-10-CM

## 2018-05-20 DIAGNOSIS — C3411 Malignant neoplasm of upper lobe, right bronchus or lung: Secondary | ICD-10-CM

## 2018-05-20 DIAGNOSIS — R9389 Abnormal findings on diagnostic imaging of other specified body structures: Secondary | ICD-10-CM

## 2018-05-27 ENCOUNTER — Ambulatory Visit
Admission: RE | Admit: 2018-05-27 | Discharge: 2018-05-27 | Disposition: A | Discharge status: Home / Self Care | Payer: Medicare Other | Source: Ambulatory Visit | Attending: Family Medicine | Admitting: Family Medicine

## 2018-05-27 ENCOUNTER — Inpatient Hospital Stay: Admission: RE | Admit: 2018-05-27 | Payer: Medicare Other | Source: Ambulatory Visit

## 2018-05-27 DIAGNOSIS — C3411 Malignant neoplasm of upper lobe, right bronchus or lung: Secondary | ICD-10-CM

## 2018-05-27 DIAGNOSIS — J189 Pneumonia, unspecified organism: Secondary | ICD-10-CM

## 2018-05-27 DIAGNOSIS — R9389 Abnormal findings on diagnostic imaging of other specified body structures: Secondary | ICD-10-CM

## 2018-05-27 MED ORDER — IOPAMIDOL (ISOVUE-300) INJECTION 61%
50.0000 mL | Freq: Once | INTRAVENOUS | Status: AC | PRN
  Administered 2018-05-27: 50 mL via INTRAVENOUS

## 2018-05-31 ENCOUNTER — Other Ambulatory Visit: Payer: Self-pay | Admitting: *Deleted

## 2018-05-31 ENCOUNTER — Encounter: Payer: Self-pay | Admitting: Thoracic Surgery (Cardiothoracic Vascular Surgery)

## 2018-05-31 DIAGNOSIS — J90 Pleural effusion, not elsewhere classified: Secondary | ICD-10-CM

## 2018-05-31 NOTE — Progress Notes (Signed)
   I reviewed Mrs. Counterman's CT from 2/29. She does have a fairly big effusion on the right.  I am going to have my office set her up for a thoracentesis and then see her back sooner than the 24th.   Revonda Standard Roxan Hockey, MD [facility] [phone]

## 2018-06-01 ENCOUNTER — Other Ambulatory Visit (HOSPITAL_COMMUNITY): Payer: Self-pay | Admitting: Physician Assistant

## 2018-06-01 ENCOUNTER — Encounter (HOSPITAL_COMMUNITY): Payer: Self-pay | Admitting: Physician Assistant

## 2018-06-01 ENCOUNTER — Ambulatory Visit (HOSPITAL_COMMUNITY)
Admission: RE | Admit: 2018-06-01 | Discharge: 2018-06-01 | Disposition: A | Discharge status: Home / Self Care | Payer: Medicare Other | Source: Ambulatory Visit | Attending: Physician Assistant | Admitting: Physician Assistant

## 2018-06-01 ENCOUNTER — Ambulatory Visit (HOSPITAL_COMMUNITY)
Admission: RE | Admit: 2018-06-01 | Discharge: 2018-06-01 | Disposition: A | Discharge status: Home / Self Care | Payer: Medicare Other | Source: Ambulatory Visit | Attending: Thoracic Surgery (Cardiothoracic Vascular Surgery) | Admitting: Thoracic Surgery (Cardiothoracic Vascular Surgery)

## 2018-06-01 DIAGNOSIS — D72820 Lymphocytosis (symptomatic): Secondary | ICD-10-CM | POA: Insufficient documentation

## 2018-06-01 DIAGNOSIS — J90 Pleural effusion, not elsewhere classified: Secondary | ICD-10-CM

## 2018-06-01 HISTORY — PX: IR THORACENTESIS ASP PLEURAL SPACE W/IMG GUIDE: IMG5380

## 2018-06-01 MED ORDER — LIDOCAINE HCL 1 % IJ SOLN
INTRAMUSCULAR | Status: AC | PRN
  Administered 2018-06-01: 10 mL

## 2018-06-01 MED ORDER — LIDOCAINE HCL 1 % IJ SOLN
INTRAMUSCULAR | Status: AC
  Filled 2018-06-01: qty 20

## 2018-06-01 NOTE — Procedures (Signed)
PROCEDURE SUMMARY:  Successful image-guided right thoracentesis. Yielded 1.2 liters of amber fluid. Patient tolerated procedure well. EBL: Zero No immediate complications.  Specimen was sent for labs. Post procedure CXR shows no pneumothorax.  Please see imaging section of Epic for full dictation.  Joaquim Nam PA-C 06/01/2018 2:31 PM

## 2018-06-04 ENCOUNTER — Other Ambulatory Visit: Payer: Self-pay | Admitting: Physician Assistant

## 2018-06-13 ENCOUNTER — Other Ambulatory Visit: Payer: Self-pay | Admitting: *Deleted

## 2018-06-13 ENCOUNTER — Other Ambulatory Visit: Payer: Self-pay

## 2018-06-13 DIAGNOSIS — Z902 Acquired absence of lung [part of]: Secondary | ICD-10-CM

## 2018-06-14 ENCOUNTER — Ambulatory Visit
Admission: RE | Admit: 2018-06-14 | Discharge: 2018-06-14 | Disposition: A | Discharge status: Home / Self Care | Payer: Medicare Other | Source: Ambulatory Visit | Attending: Thoracic Surgery (Cardiothoracic Vascular Surgery) | Admitting: Thoracic Surgery (Cardiothoracic Vascular Surgery)

## 2018-06-14 ENCOUNTER — Ambulatory Visit (INDEPENDENT_AMBULATORY_CARE_PROVIDER_SITE_OTHER): Payer: Self-pay | Admitting: Thoracic Surgery (Cardiothoracic Vascular Surgery)

## 2018-06-14 VITALS — BP 110/60 | HR 62 | Resp 20 | Ht 61.0 in | Wt 149.0 lb

## 2018-06-14 DIAGNOSIS — Z902 Acquired absence of lung [part of]: Secondary | ICD-10-CM

## 2018-06-14 DIAGNOSIS — C3431 Malignant neoplasm of lower lobe, right bronchus or lung: Secondary | ICD-10-CM

## 2018-06-14 MED ORDER — PREDNISONE 10 MG (21) PO TBPK: ORAL_TABLET | ORAL | 0 refills | Status: AC

## 2018-06-14 NOTE — Progress Notes (Signed)
[address]       [place] [address]             [phone]     HPI: Maria Arroyo returns for a follow-up regarding a pleural effusion.  Maria Arroyo is a 68 year old smoker with a history of COPD, coronary disease, hypertension, hyperlipidemia, osteoarthritis, chronic pain, neuropathy, gout, and depression.  She presented with a 25 pound weight loss.  She was found to have a right lower lobe lung nodule.  She had a thoracoscopic right lower lobectomy in early January.  This turned out to be a T2, N0, stage Ib squamous cell carcinoma.  She saw Dr. Julien Nordmann no adjuvant therapy was recommended.  She presented back to Dr. Melford Aase in late February with cough and shortness of breath.  A CT of the chest showed a right pleural effusion.  A thoracentesis drained 1.2 L of amber fluid.  Cytology was negative.  She states that she had a lot of pain the day the CT was done but her pain resolved soon after.  She has not had any more pain since then.  She did feel that her breathing improved after the thoracentesis.  She has not had any worsening shortness of breath.  Past Medical History:  Diagnosis Date  . Cervical disc disease   . COPD (chronic obstructive pulmonary disease) ([place])   . Coronary artery disease   . Depression   . Gout    R foot and knee  . Hyperlipidemia   . Hypertension   . Neuropathy   . OA (osteoarthritis)   . Tobacco abuse      Current Outpatient Medications  Medication Sig Dispense Refill  . acetaminophen (TYLENOL) 500 MG tablet Take 2 tablets (1,000 mg total) by mouth every 6 (six) hours. 30 tablet 0  . allopurinol (ZYLOPRIM) 100 MG tablet Take 200 mg by mouth daily.    [name] amiodarone (PACERONE) 200 MG tablet Take 2 tabs (400 mg)  twice daily for 14 days, then 1 tab (200 mg) daily thereafter. 90 tablet 1  . amLODipine (NORVASC) 5 MG tablet Take 1 tablet (5 mg total) by mouth daily. 90 tablet 1  . apixaban (ELIQUIS) 5 MG TABS tablet Take 1  tablet (5 mg total) by mouth 2 (two) times daily. 60 tablet 1  . aspirin 81 MG tablet Take 81 mg by mouth daily.    . bisoprolol (ZEBETA) 5 MG tablet Take 0.5 tablets (2.5 mg total) by mouth daily. 30 tablet 11  . busPIRone (BUSPAR) 15 MG tablet Take 15 mg by mouth 2 (two) times daily.    . Calcium Carbonate-Vitamin D (CALCIUM 600 + D PO) Take 1 tablet by mouth daily.     . carisoprodol (SOMA) 350 MG tablet Take 1 tablet by mouth 4 (four) times daily as needed for muscle spasms.    . Cholecalciferol (VITAMIN D) 2000 UNITS tablet Take 4,000 Units by mouth daily.     [name] gabapentin (NEURONTIN) 600 MG tablet TAKE 1 TABLET(600 MG) BY MOUTH FOUR TIMES DAILY AT 8 AM AND AT 1 PM AND AT 6 PM AND AT 10 PM 120 tablet 11  . Multiple Vitamin (MULTIVITAMIN) tablet Take 1 tablet by mouth daily.     . nitroGLYCERIN (NITROSTAT) 0.4 MG SL tablet Place 1 tablet (0.4 mg total) under the tongue every 5 (five) minutes as needed. For chest pain (Patient taking differently: Place 0.4 mg under the tongue every 5 (five) minutes as  needed for chest pain. ) 100 tablet 0  . oxyCODONE-acetaminophen (PERCOCET) 10-325 MG tablet Take 1 tablet by mouth 4 (four) times daily as needed for pain.    [name] PROVENTIL HFA 108 (90 Base) MCG/ACT inhaler Inhale 1-2 puffs into the lungs every 4 (four) hours as needed for wheezing or shortness of breath.   4  . rosuvastatin (CRESTOR) 40 MG tablet Take 1 tablet (40 mg total) by mouth daily. 90 tablet 3  . venlafaxine XR (EFFEXOR-XR) 75 MG 24 hr capsule Take 75 mg by mouth 2 (two) times daily.   0   No current facility-administered medications for this visit.     Physical Exam BP 110/60   Pulse 62   Resp 20   Ht 5\' 1"  (1.549 m)   Wt 149 lb (67.6 kg)   SpO2 95% Comment: RA  BMI 28.15 kg/m   Diagnostic Tests: CHEST - 2 VIEW  COMPARISON:  06/01/2018  FINDINGS: Cardiac shadow is stable. Mild volume loss is noted on the right consistent with the prior surgical history. Some mild linear  density is noted along the minor fissure within the right upper lobe consistent with focal atelectasis. The previously seen nodular density is less well appreciated and may have represented fluid within the minor fissure. No significant increase in effusion is seen. Degenerative changes of the thoracic spine are noted.  IMPRESSION: Postsurgical changes on the right. Mild right upper lobe atelectasis is noted as described.   Electronically Signed   By: Inez Catalina M.D.   On: 06/14/2018 14:42 I personally reviewed the chest x-ray images and concur with the findings noted above  Diagnosis PLEURAL FLUID, RIGHT (SPECIMEN 1 OF 1 COLLECTED 06-01-2018) REACTIVE MESOTHELIAL CELLS. NUMEROUS LYMPHOCYTES. Claudette Laws MD Pathologist, Electronic Signature (Case signed 06/03/2018  Impression: Maria Arroyo is a 68 year old woman with history of tobacco abuse who recently had a right lower lobectomy for stage Ib non-small cell carcinoma.  She presented almost 2 months later with cough and shortness of breath.  A CT showed a sizable right pleural effusion.  She had a thoracentesis draining 1.2 L of fluid.  The fluid was described as Museum/gallery conservator.  Cytology was negative for tumor cells.  I suspect this is just a fairly typical postoperative effusion.  Does not like it has recurred to some degree and there may be an inflammatory component.  I think it is reasonable to give her a steroid taper to see if that helps at all.  She otherwise is doing very well with her recovery.  Plan: Prednisone taper Return in 1 month with PA and lateral chest x-ray  Melrose Nakayama, MD [facility] [phone]

## 2018-06-21 ENCOUNTER — Encounter: Payer: Medicare Other | Admitting: Thoracic Surgery (Cardiothoracic Vascular Surgery)

## 2018-06-24 ENCOUNTER — Telehealth (HOSPITAL_COMMUNITY): Payer: Self-pay

## 2018-06-24 NOTE — Telephone Encounter (Signed)
Pt insurance is active and benefits verified through Medicare A/B. Co-pay $0.00, DED $198.00/$198.00 met, out of pocket $0.00/$0.00 met, co-insurance 20%. No pre-authorization required. 06/24/2018  2ndary insurance is active and benefits verified through [employer]. Co-pay $0.00, DED $0.00/$0.00 met, out of pocket $0.00/$0.00 met, co-insurance 0%. No pre-authorization required. 06/24/2018

## 2018-06-24 NOTE — Telephone Encounter (Signed)
Called patient to see if she is interested in the Pulmonary Rehab Program. Patient expressed interest. Explained scheduling process and went over insurance, patient verbalized understanding. Adv pt our department is closed right now due to COVID-19 and once we resume scheduling we will contact her.

## 2018-07-04 ENCOUNTER — Other Ambulatory Visit: Payer: Self-pay | Admitting: Physician Assistant

## 2018-07-13 ENCOUNTER — Other Ambulatory Visit: Payer: Self-pay | Admitting: Thoracic Surgery (Cardiothoracic Vascular Surgery)

## 2018-07-13 DIAGNOSIS — C3491 Malignant neoplasm of unspecified part of right bronchus or lung: Secondary | ICD-10-CM

## 2018-07-19 ENCOUNTER — Ambulatory Visit: Payer: Medicare Other | Admitting: Thoracic Surgery (Cardiothoracic Vascular Surgery)

## 2018-07-20 ENCOUNTER — Telehealth: Payer: Self-pay

## 2018-07-20 NOTE — Telephone Encounter (Signed)
Left voicemail for patient to call the office back to reschedule the office visit to a virtual visit.

## 2018-07-26 ENCOUNTER — Telehealth (HOSPITAL_COMMUNITY): Payer: Self-pay | Admitting: *Deleted

## 2018-08-01 ENCOUNTER — Ambulatory Visit: Payer: Medicare Other | Admitting: Cardiology

## 2018-08-04 ENCOUNTER — Other Ambulatory Visit: Payer: Self-pay | Admitting: Physician Assistant

## 2018-08-08 ENCOUNTER — Other Ambulatory Visit: Payer: Self-pay

## 2018-08-09 ENCOUNTER — Ambulatory Visit
Admission: RE | Admit: 2018-08-09 | Discharge: 2018-08-09 | Disposition: A | Discharge status: Home / Self Care | Payer: Medicare Other | Source: Ambulatory Visit | Attending: Thoracic Surgery (Cardiothoracic Vascular Surgery) | Admitting: Thoracic Surgery (Cardiothoracic Vascular Surgery)

## 2018-08-09 ENCOUNTER — Other Ambulatory Visit: Payer: Self-pay | Admitting: Thoracic Surgery (Cardiothoracic Vascular Surgery)

## 2018-08-09 ENCOUNTER — Ambulatory Visit (INDEPENDENT_AMBULATORY_CARE_PROVIDER_SITE_OTHER): Payer: Medicare Other | Admitting: Thoracic Surgery (Cardiothoracic Vascular Surgery)

## 2018-08-09 VITALS — BP 110/64 | HR 68 | Temp 97.3°F | Resp 20 | Ht 61.0 in | Wt 147.0 lb

## 2018-08-09 DIAGNOSIS — C3491 Malignant neoplasm of unspecified part of right bronchus or lung: Secondary | ICD-10-CM

## 2018-08-09 DIAGNOSIS — I2510 Atherosclerotic heart disease of native coronary artery without angina pectoris: Secondary | ICD-10-CM | POA: Diagnosis not present

## 2018-08-09 DIAGNOSIS — J90 Pleural effusion, not elsewhere classified: Secondary | ICD-10-CM

## 2018-08-09 DIAGNOSIS — Z902 Acquired absence of lung [part of]: Secondary | ICD-10-CM

## 2018-08-09 MED ORDER — FUROSEMIDE 20 MG PO TABS: 40.0000 mg | ORAL_TABLET | Freq: Every day | ORAL | 1 refills | Status: DC

## 2018-08-09 NOTE — Progress Notes (Signed)
[address]       [place] [address]             [phone]    HPI: Mrs. Maria Arroyo returns for scheduled follow-up visit  Maria Arroyo is a 68 year old woman with a history of tobacco abuse, COPD, CAD, hypertension, hyperlipidemia, osteoarthritis, neuropathy, chronic pain, gout, and depression.  She underwent a thoracoscopic right lower lobectomy in January 2020 for a T2, N0, stage IB squamous cell carcinoma.  In February she presented with cough and shortness of breath.  A CT showed a right pleural effusion.  She had a thoracentesis which drained 1.2 L of fluid on 06/01/2018.  Cytology was negative.  I treated her with a steroid taper after that.  She was to follow-up at 1 month but the appointment was delayed due to the COVID situation.  She says that her breathing is about the same as it was when I saw her last.  She has noticed increased swelling in her feet.  She has been taking Lasix 20 mg every day.  It is not listed on her med sheet but was added at her last visit with cardiology.  She has been watching her salt intake but says she has been eating a lot of ice.  Past Medical History:  Diagnosis Date  . Cervical disc disease   . COPD (chronic obstructive pulmonary disease) ([place])   . Coronary artery disease   . Depression   . Gout    R foot and knee  . Hyperlipidemia   . Hypertension   . Neuropathy   . OA (osteoarthritis)   . Tobacco abuse     Current Outpatient Medications  Medication Sig Dispense Refill  . acetaminophen (TYLENOL) 500 MG tablet Take 2 tablets (1,000 mg total) by mouth every 6 (six) hours. 30 tablet 0  . allopurinol (ZYLOPRIM) 100 MG tablet Take 200 mg by mouth daily.    [name] amiodarone (PACERONE) 200 MG tablet Take 2 tabs (400 mg)  twice daily for 14 days, then 1 tab (200 mg) daily thereafter. 90 tablet 1  . apixaban (ELIQUIS) 5 MG TABS tablet Take 1 tablet (5 mg total) by mouth 2 (two) times daily. 60 tablet 1  . aspirin 81 MG tablet  Take 81 mg by mouth daily.    . bisoprolol (ZEBETA) 5 MG tablet Take 0.5 tablets (2.5 mg total) by mouth daily. 30 tablet 11  . busPIRone (BUSPAR) 15 MG tablet Take 15 mg by mouth 2 (two) times daily.    . Calcium Carbonate-Vitamin D (CALCIUM 600 + D PO) Take 1 tablet by mouth daily.     . carisoprodol (SOMA) 350 MG tablet Take 1 tablet by mouth 4 (four) times daily as needed for muscle spasms.    . Cholecalciferol (VITAMIN D) 2000 UNITS tablet Take 4,000 Units by mouth daily.     [name] gabapentin (NEURONTIN) 600 MG tablet TAKE 1 TABLET(600 MG) BY MOUTH FOUR TIMES DAILY AT 8 AM AND AT 1 PM AND AT 6 PM AND AT 10 PM 120 tablet 11  . Multiple Vitamin (MULTIVITAMIN) tablet Take 1 tablet by mouth daily.     . nitroGLYCERIN (NITROSTAT) 0.4 MG SL tablet Place 1 tablet (0.4 mg total) under the tongue every 5 (five) minutes as needed. For chest pain (Patient taking differently: Place 0.4 mg under the tongue every 5 (five) minutes as needed for chest pain. ) 100 tablet 0  . oxyCODONE-acetaminophen (PERCOCET) 10-325 MG tablet  Take 1 tablet by mouth 4 (four) times daily as needed for pain.    [name] PROVENTIL HFA 108 (90 Base) MCG/ACT inhaler Inhale 1-2 puffs into the lungs every 4 (four) hours as needed for wheezing or shortness of breath.   4  . venlafaxine XR (EFFEXOR-XR) 75 MG 24 hr capsule Take 75 mg by mouth 2 (two) times daily.   0  . amLODipine (NORVASC) 5 MG tablet Take 1 tablet (5 mg total) by mouth daily. 90 tablet 1  . furosemide (LASIX) 20 MG tablet Take 2 tablets (40 mg total) by mouth daily. 60 tablet 1  . rosuvastatin (CRESTOR) 40 MG tablet Take 1 tablet (40 mg total) by mouth daily. 90 tablet 3   No current facility-administered medications for this visit.     Physical Exam BP 110/64   Pulse 68   Temp (!) 97.3 F (36.3 C) (Skin)   Resp 20   Ht 5\' 1"  (1.549 m)   Wt 147 lb (66.7 kg)   SpO2 97% Comment: RA  BMI 27.23 kg/m  68 year old woman in no acute distress Alert and oriented x3 with no  focal deficits Lungs diminished breath sounds at right base, otherwise clear Cardiac regular rate and rhythm 1+ edema feet and ankles bilaterally  Diagnostic Tests: CHEST - 2 VIEW  COMPARISON:  June 14, 2018  FINDINGS: There is stable appearing postoperative change on the right with areas of volume loss. Left lung is clear. Heart size and pulmonary vascularity are normal. No adenopathy appreciable. There is a right coronary artery stent. There is postoperative change in the lower cervical region. There is degenerative change in the thoracic spine.  IMPRESSION: Postoperative changes on the right with volume loss, unchanged from 2 months prior. No edema or consolidation. No new opacity. Stable cardiac silhouette.   Electronically Signed   By: Lowella Grip III M.D.   On: 08/09/2018 15:57 I personally reviewed the chest x-ray images and concur with the findings noted above  Impression: Maria Arroyo is a 68 year old woman with a history of tobacco abuse, COPD, CAD, hypertension, hyperlipidemia, osteoarthritis, neuropathy, chronic pain, gout, and depression.  She underwent a thoracoscopic right lower lobectomy in January 2020 for a T2, N0, stage IB squamous cell carcinoma.  She developed a right pleural effusion.  To some degree this is probably fluid filling some of the space left by her right lower lobectomy.  She did have a thoracentesis which drained 1.2 L of fluid.  Cytology was negative.  She was treated with a steroid taper.  Her chest x-ray today is stable compared to her film from March 17.  Her respiratory status is stable.  I do think there is some fluid but I do not think another thoracentesis is warranted.  Peripheral edema-she had been on torsemide previously but that was discontinued at some point.  When she last saw cardiology she was started back on Lasix 20 mg daily PRN.  She says she has been taking that every day and it has not helped her swelling in her  feet and ankles.  I am going to increase that to 40 mg of Lasix daily.  She will have a CT and see Dr. Julien Nordmann in July.  I will see her back after that CT is done as well.  Plan: Increase Lasix to 40 mg daily Return in August after CT of chest  Melrose Nakayama, MD [facility] [phone]

## 2018-08-26 ENCOUNTER — Inpatient Hospital Stay (HOSPITAL_COMMUNITY)
Admission: EM | Admit: 2018-08-26 | Discharge: 2018-08-30 | DRG: 291 | Disposition: A | Discharge status: Home Health | Payer: Medicare Other | Attending: Internal Medicine | Admitting: Internal Medicine

## 2018-08-26 ENCOUNTER — Emergency Department (HOSPITAL_COMMUNITY): Payer: Medicare Other

## 2018-08-26 ENCOUNTER — Other Ambulatory Visit: Payer: Self-pay

## 2018-08-26 ENCOUNTER — Telehealth: Payer: Self-pay | Admitting: Internal Medicine

## 2018-08-26 ENCOUNTER — Encounter (HOSPITAL_COMMUNITY): Payer: Self-pay | Admitting: Emergency Medicine

## 2018-08-26 DIAGNOSIS — Z7901 Long term (current) use of anticoagulants: Secondary | ICD-10-CM

## 2018-08-26 DIAGNOSIS — I361 Nonrheumatic tricuspid (valve) insufficiency: Secondary | ICD-10-CM | POA: Diagnosis not present

## 2018-08-26 DIAGNOSIS — F17210 Nicotine dependence, cigarettes, uncomplicated: Secondary | ICD-10-CM | POA: Diagnosis present

## 2018-08-26 DIAGNOSIS — I50810 Right heart failure, unspecified: Secondary | ICD-10-CM | POA: Diagnosis present

## 2018-08-26 DIAGNOSIS — T45515A Adverse effect of anticoagulants, initial encounter: Secondary | ICD-10-CM | POA: Diagnosis present

## 2018-08-26 DIAGNOSIS — T39015A Adverse effect of aspirin, initial encounter: Secondary | ICD-10-CM | POA: Diagnosis present

## 2018-08-26 DIAGNOSIS — Z90710 Acquired absence of both cervix and uterus: Secondary | ICD-10-CM | POA: Diagnosis not present

## 2018-08-26 DIAGNOSIS — F32A Depression, unspecified: Secondary | ICD-10-CM | POA: Diagnosis present

## 2018-08-26 DIAGNOSIS — R0609 Other forms of dyspnea: Secondary | ICD-10-CM | POA: Diagnosis not present

## 2018-08-26 DIAGNOSIS — I340 Nonrheumatic mitral (valve) insufficiency: Secondary | ICD-10-CM | POA: Diagnosis not present

## 2018-08-26 DIAGNOSIS — F329 Major depressive disorder, single episode, unspecified: Secondary | ICD-10-CM | POA: Diagnosis present

## 2018-08-26 DIAGNOSIS — J9621 Acute and chronic respiratory failure with hypoxia: Secondary | ICD-10-CM | POA: Diagnosis present

## 2018-08-26 DIAGNOSIS — N183 Chronic kidney disease, stage 3 (moderate): Secondary | ICD-10-CM | POA: Diagnosis present

## 2018-08-26 DIAGNOSIS — I480 Paroxysmal atrial fibrillation: Secondary | ICD-10-CM | POA: Diagnosis present

## 2018-08-26 DIAGNOSIS — E538 Deficiency of other specified B group vitamins: Secondary | ICD-10-CM | POA: Diagnosis present

## 2018-08-26 DIAGNOSIS — Z902 Acquired absence of lung [part of]: Secondary | ICD-10-CM

## 2018-08-26 DIAGNOSIS — J90 Pleural effusion, not elsewhere classified: Secondary | ICD-10-CM | POA: Diagnosis not present

## 2018-08-26 DIAGNOSIS — I2510 Atherosclerotic heart disease of native coronary artery without angina pectoris: Secondary | ICD-10-CM | POA: Diagnosis not present

## 2018-08-26 DIAGNOSIS — Z6828 Body mass index (BMI) 28.0-28.9, adult: Secondary | ICD-10-CM

## 2018-08-26 DIAGNOSIS — R0600 Dyspnea, unspecified: Secondary | ICD-10-CM

## 2018-08-26 DIAGNOSIS — I248 Other forms of acute ischemic heart disease: Secondary | ICD-10-CM | POA: Diagnosis present

## 2018-08-26 DIAGNOSIS — C3491 Malignant neoplasm of unspecified part of right bronchus or lung: Secondary | ICD-10-CM | POA: Diagnosis present

## 2018-08-26 DIAGNOSIS — E669 Obesity, unspecified: Secondary | ICD-10-CM | POA: Diagnosis present

## 2018-08-26 DIAGNOSIS — G629 Polyneuropathy, unspecified: Secondary | ICD-10-CM | POA: Diagnosis present

## 2018-08-26 DIAGNOSIS — Z7982 Long term (current) use of aspirin: Secondary | ICD-10-CM | POA: Diagnosis not present

## 2018-08-26 DIAGNOSIS — D509 Iron deficiency anemia, unspecified: Secondary | ICD-10-CM | POA: Diagnosis present

## 2018-08-26 DIAGNOSIS — I10 Essential (primary) hypertension: Secondary | ICD-10-CM | POA: Diagnosis present

## 2018-08-26 DIAGNOSIS — Z85118 Personal history of other malignant neoplasm of bronchus and lung: Secondary | ICD-10-CM | POA: Diagnosis not present

## 2018-08-26 DIAGNOSIS — Z79899 Other long term (current) drug therapy: Secondary | ICD-10-CM

## 2018-08-26 DIAGNOSIS — R0602 Shortness of breath: Secondary | ICD-10-CM | POA: Diagnosis present

## 2018-08-26 DIAGNOSIS — N179 Acute kidney failure, unspecified: Secondary | ICD-10-CM | POA: Diagnosis present

## 2018-08-26 DIAGNOSIS — J449 Chronic obstructive pulmonary disease, unspecified: Secondary | ICD-10-CM | POA: Diagnosis present

## 2018-08-26 DIAGNOSIS — I130 Hypertensive heart and chronic kidney disease with heart failure and stage 1 through stage 4 chronic kidney disease, or unspecified chronic kidney disease: Secondary | ICD-10-CM | POA: Diagnosis present

## 2018-08-26 DIAGNOSIS — I5033 Acute on chronic diastolic (congestive) heart failure: Secondary | ICD-10-CM | POA: Diagnosis present

## 2018-08-26 DIAGNOSIS — E785 Hyperlipidemia, unspecified: Secondary | ICD-10-CM | POA: Diagnosis present

## 2018-08-26 DIAGNOSIS — D649 Anemia, unspecified: Secondary | ICD-10-CM

## 2018-08-26 DIAGNOSIS — I4891 Unspecified atrial fibrillation: Secondary | ICD-10-CM

## 2018-08-26 DIAGNOSIS — Z955 Presence of coronary angioplasty implant and graft: Secondary | ICD-10-CM

## 2018-08-26 DIAGNOSIS — Z1159 Encounter for screening for other viral diseases: Secondary | ICD-10-CM

## 2018-08-26 HISTORY — DX: Malignant (primary) neoplasm, unspecified: C80.1

## 2018-08-26 LAB — IRON AND TIBC
Iron: 9 ug/dL — ABNORMAL LOW (ref 28–170)
Saturation Ratios: 2 % — ABNORMAL LOW (ref 10.4–31.8)
TIBC: 424 ug/dL (ref 250–450)
UIBC: 415 ug/dL

## 2018-08-26 LAB — COMPREHENSIVE METABOLIC PANEL
ALT: 21 U/L (ref 0–44)
AST: 24 U/L (ref 15–41)
Albumin: 4.3 g/dL (ref 3.5–5.0)
Alkaline Phosphatase: 115 U/L (ref 38–126)
Anion gap: 15 (ref 5–15)
BUN: 9 mg/dL (ref 8–23)
CO2: 24 mmol/L (ref 22–32)
Calcium: 10 mg/dL (ref 8.9–10.3)
Chloride: 100 mmol/L (ref 98–111)
Creatinine, Ser: 0.81 mg/dL (ref 0.44–1.00)
GFR calc Af Amer: >60 mL/min (ref >60)
GFR calc non Af Amer: >60 mL/min (ref >60)
Glucose, Bld: 146 mg/dL — ABNORMAL HIGH (ref 70–99)
Potassium: 3.6 mmol/L (ref 3.5–5.1)
Sodium: 139 mmol/L (ref 135–145)
Total Bilirubin: 1.4 mg/dL — ABNORMAL HIGH (ref 0.3–1.2)
Total Protein: 8.5 g/dL — ABNORMAL HIGH (ref 6.5–8.1)

## 2018-08-26 LAB — CBC WITH DIFFERENTIAL/PLATELET
Abs Immature Granulocytes: 0.05 10*3/uL (ref 0.00–0.07)
Basophils Absolute: 0 10*3/uL (ref 0.0–0.1)
Basophils Relative: 0 %
Eosinophils Absolute: 0 10*3/uL (ref 0.0–0.5)
Eosinophils Relative: 1 %
HCT: 23.2 % — ABNORMAL LOW (ref 36.0–46.0)
Hemoglobin: 6.7 g/dL — CL (ref 12.0–15.0)
Immature Granulocytes: 1 %
Lymphocytes Relative: 11 %
Lymphs Abs: 0.8 10*3/uL (ref 0.7–4.0)
MCH: 22.6 pg — ABNORMAL LOW (ref 26.0–34.0)
MCHC: 28.9 g/dL — ABNORMAL LOW (ref 30.0–36.0)
MCV: 78.1 fL — ABNORMAL LOW (ref 80.0–100.0)
Monocytes Absolute: 0.5 10*3/uL (ref 0.1–1.0)
Monocytes Relative: 7 %
Neutro Abs: 6 10*3/uL (ref 1.7–7.7)
Neutrophils Relative %: 80 %
Platelets: 393 10*3/uL (ref 150–400)
RBC: 2.97 MIL/uL — ABNORMAL LOW (ref 3.87–5.11)
RDW: 18.3 % — ABNORMAL HIGH (ref 11.5–15.5)
WBC: 7.5 10*3/uL (ref 4.0–10.5)
nRBC: 0.3 % — ABNORMAL HIGH (ref 0.0–0.2)

## 2018-08-26 LAB — POC OCCULT BLOOD, ED: Fecal Occult Bld: NEGATIVE

## 2018-08-26 LAB — RETICULOCYTES
Immature Retic Fract: 29.9 % — ABNORMAL HIGH (ref 2.3–15.9)
RBC.: 3.03 MIL/uL — ABNORMAL LOW (ref 3.87–5.11)
Retic Count, Absolute: 52.7 10*3/uL (ref 19.0–186.0)
Retic Ct Pct: 1.7 % (ref 0.4–3.1)

## 2018-08-26 LAB — PREPARE RBC (CROSSMATCH)

## 2018-08-26 LAB — FERRITIN: Ferritin: 8 ng/mL — ABNORMAL LOW (ref 11–307)

## 2018-08-26 LAB — FOLATE: Folate: 7.7 ng/mL (ref >5.9)

## 2018-08-26 LAB — BRAIN NATRIURETIC PEPTIDE: B Natriuretic Peptide: 1280.5 pg/mL — ABNORMAL HIGH (ref 0.0–100.0)

## 2018-08-26 LAB — TROPONIN I: Troponin I: 0.26 ng/mL (ref <0.03)

## 2018-08-26 LAB — SARS CORONAVIRUS 2 BY RT PCR (HOSPITAL ORDER, PERFORMED IN ~~LOC~~ HOSPITAL LAB): SARS Coronavirus 2: NEGATIVE

## 2018-08-26 LAB — VITAMIN B12: Vitamin B-12: 184 pg/mL (ref 180–914)

## 2018-08-26 MED ORDER — ACETAMINOPHEN 650 MG RE SUPP: 650.0000 mg | Freq: Four times a day (QID) | RECTAL | Status: DC | PRN

## 2018-08-26 MED ORDER — GABAPENTIN 600 MG PO TABS
600.0000 mg | ORAL_TABLET | Freq: Four times a day (QID) | ORAL | Status: DC
  Administered 2018-08-26 – 2018-08-30 (×14): 600 mg via ORAL
  Filled 2018-08-26 (×14): qty 1

## 2018-08-26 MED ORDER — NICOTINE 21 MG/24HR TD PT24
21.0000 mg | MEDICATED_PATCH | Freq: Every day | TRANSDERMAL | Status: DC
  Administered 2018-08-26 – 2018-08-30 (×5): 21 mg via TRANSDERMAL
  Filled 2018-08-26 (×5): qty 1

## 2018-08-26 MED ORDER — BISOPROLOL FUMARATE 5 MG PO TABS
2.5000 mg | ORAL_TABLET | Freq: Every day | ORAL | Status: DC
  Administered 2018-08-26 – 2018-08-30 (×5): 2.5 mg via ORAL
  Filled 2018-08-26 (×5): qty 1

## 2018-08-26 MED ORDER — SODIUM CHLORIDE 0.9% FLUSH
3.0000 mL | Freq: Two times a day (BID) | INTRAVENOUS | Status: DC
  Administered 2018-08-26 – 2018-08-30 (×8): 3 mL via INTRAVENOUS

## 2018-08-26 MED ORDER — AMIODARONE HCL 200 MG PO TABS
200.0000 mg | ORAL_TABLET | Freq: Every day | ORAL | Status: DC
  Administered 2018-08-26 – 2018-08-30 (×5): 200 mg via ORAL
  Filled 2018-08-26 (×5): qty 1

## 2018-08-26 MED ORDER — CYANOCOBALAMIN 1000 MCG/ML IJ SOLN
1000.0000 ug | Freq: Once | INTRAMUSCULAR | Status: AC
  Administered 2018-08-27: 1000 ug via INTRAMUSCULAR
  Filled 2018-08-26: qty 1

## 2018-08-26 MED ORDER — VENLAFAXINE HCL ER 75 MG PO CP24
75.0000 mg | ORAL_CAPSULE | Freq: Two times a day (BID) | ORAL | Status: DC
  Administered 2018-08-26 – 2018-08-30 (×8): 75 mg via ORAL
  Filled 2018-08-26 (×8): qty 1

## 2018-08-26 MED ORDER — FUROSEMIDE 10 MG/ML IJ SOLN
40.0000 mg | Freq: Two times a day (BID) | INTRAMUSCULAR | Status: DC
  Administered 2018-08-26 – 2018-08-29 (×6): 40 mg via INTRAVENOUS
  Filled 2018-08-26 (×7): qty 4

## 2018-08-26 MED ORDER — ALBUTEROL SULFATE (2.5 MG/3ML) 0.083% IN NEBU: 3.0000 mL | INHALATION_SOLUTION | RESPIRATORY_TRACT | Status: DC | PRN

## 2018-08-26 MED ORDER — HYDRALAZINE HCL 20 MG/ML IJ SOLN
10.0000 mg | Freq: Four times a day (QID) | INTRAMUSCULAR | Status: DC | PRN
  Administered 2018-08-27: 04:00:00 10 mg via INTRAVENOUS
  Filled 2018-08-26: qty 1

## 2018-08-26 MED ORDER — ROSUVASTATIN CALCIUM 20 MG PO TABS
40.0000 mg | ORAL_TABLET | Freq: Every day | ORAL | Status: DC
  Administered 2018-08-26 – 2018-08-30 (×5): 40 mg via ORAL
  Filled 2018-08-26 (×5): qty 2

## 2018-08-26 MED ORDER — BUSPIRONE HCL 10 MG PO TABS
15.0000 mg | ORAL_TABLET | Freq: Two times a day (BID) | ORAL | Status: DC
  Administered 2018-08-26 – 2018-08-30 (×8): 15 mg via ORAL
  Filled 2018-08-26 (×6): qty 1.5
  Filled 2018-08-26: qty 3
  Filled 2018-08-26: qty 1.5
  Filled 2018-08-26 (×6): qty 3
  Filled 2018-08-26: qty 1.5
  Filled 2018-08-26: qty 3
  Filled 2018-08-26: qty 1.5

## 2018-08-26 MED ORDER — ACETAMINOPHEN 325 MG PO TABS: 650.0000 mg | ORAL_TABLET | Freq: Four times a day (QID) | ORAL | Status: DC | PRN

## 2018-08-26 MED ORDER — INSULIN ASPART 100 UNIT/ML ~~LOC~~ SOLN
5.0000 [IU] | Freq: Once | SUBCUTANEOUS | Status: AC
  Administered 2018-08-27: 5 [IU] via SUBCUTANEOUS

## 2018-08-26 MED ORDER — SODIUM CHLORIDE 0.9 % IV SOLN
510.0000 mg | Freq: Once | INTRAVENOUS | Status: AC
  Administered 2018-08-27: 510 mg via INTRAVENOUS
  Filled 2018-08-26: qty 17

## 2018-08-26 MED ORDER — SODIUM CHLORIDE 0.9 % IV SOLN
10.0000 mL/h | Freq: Once | INTRAVENOUS | Status: AC
  Administered 2018-08-26: 18:00:00 10 mL/h via INTRAVENOUS

## 2018-08-26 NOTE — ED Notes (Signed)
ED TO INPATIENT HANDOFF REPORT  ED Nurse Name and Phone #: [phone]  S Name/Age/Gender Maria Arroyo 68 y.o. female Room/Bed: 040C/040C  Code Status   Code Status: Prior  Home/SNF/Other Home Patient oriented to: self, place, time and situation Is this baseline? Yes   Triage Complete: Triage complete  Chief Complaint SOB  Triage Note Was at dr office with c/o sob  States having increased sob x 3 weeks states still smokes , having increasing  Feet to knees doubled her lasix 3 weeks ago per dr office sob on exertion   Allergies Allergies  Allergen Reactions  . Ace Inhibitors Other (See Comments)    Worsening renal function   . Codeine Nausea Only  . Codeine-Guaifenesin [Guaifenesin-Codeine] Nausea Only and Other (See Comments)    Some nausea--but ok with percocet    Level of Care/Admitting Diagnosis ED Disposition    ED Disposition Condition [facility] Area: [place] [[id number]]  Level of Care: Telemetry Cardiac [103]  Covid Evaluation: Confirmed COVID Negative  Diagnosis: Symptomatic anemia [[id number]]  Admitting Physician: Lenore Cordia [[id number]]  Attending Physician: Lenore Cordia [[id number]]  Estimated length of stay: past midnight tomorrow  Certification:: I certify this patient will need inpatient services for at least 2 midnights  PT Class (Do Not Modify): Inpatient [101]  PT Acc Code (Do Not Modify): Private [1]       B Medical/Surgery History Past Medical History:  Diagnosis Date  . Cancer ([place])    lung  . Cervical disc disease   . COPD (chronic obstructive pulmonary disease) ([place])   . Coronary artery disease   . Depression   . Gout    R foot and knee  . Hyperlipidemia   . Hypertension   . Neuropathy   . OA (osteoarthritis)   . Tobacco abuse    Past Surgical History:  Procedure Laterality Date  . ABDOMINAL HYSTERECTOMY    . ANTERIOR CERVICAL DECOMP/DISCECTOMY FUSION  02/06/2011   Procedure: ANTERIOR  CERVICAL DECOMPRESSION/DISCECTOMY FUSION 2 LEVELS;  Surgeon: Winfield Cunas;  Location: [facility];  Service: Neurosurgery;  Laterality: N/A;  Anterior Cervical Four-Five/Five-Six Decompression and Fusion with Plating and Bonegraft  . BLADDER SURGERY    . CARDIAC CATHETERIZATION  04/09/2009   EF 60%  . CARDIAC CATHETERIZATION  03/24/2001   EF 55%  . CARDIAC CATHETERIZATION  01/06/2001   EF 65%  . CHOLECYSTECTOMY    . CORONARY STENT PLACEMENT  03/2009   STENTING OF THE PROXIMAL TO MID RIGHT CORONARY  . IR THORACENTESIS ASP PLEURAL SPACE W/IMG GUIDE  06/01/2018  . LEFT HEART CATHETERIZATION WITH CORONARY ANGIOGRAM N/A 12/21/2011   Procedure: LEFT HEART CATHETERIZATION WITH CORONARY ANGIOGRAM;  Surgeon: Peter M Martinique, MD;  Location: [facility] CATH LAB;  Service: Cardiovascular;  Laterality: N/A;  . LOBECTOMY    . NODE DISSECTION N/A 04/04/2018   Procedure: NODE DISSECTION;  Surgeon: [place] Nakayama, MD;  Location: [facility];  Service: Thoracic;  Laterality: N/A;  . OTHER SURGICAL HISTORY  12/2014   R foot cyst removal  . VIDEO ASSISTED THORACOSCOPY (VATS)/ LOBECTOMY Right 04/04/2018   Procedure: VIDEO ASSISTED THORACOSCOPY (VATS)/RIGHT LOWER LOBECTOMY;  Surgeon: Melrose Nakayama, MD;  Location: [facility];  Service: Thoracic;  Laterality: Right;     A IV Location/Drains/Wounds Patient Lines/Drains/Airways Status   Active Line/Drains/Airways    Name:   Placement date:   Placement time:   Site:   Days:   Peripheral IV  08/26/18 Left Antecubital   08/26/18    1311    Antecubital   less than 1   Incision (Closed) 04/04/18 Chest Right   04/04/18    1021     144   Incision (Closed) 04/04/18 Chest Right   04/04/18    1021     144          Intake/Output Last 24 hours  Intake/Output Summary (Last 24 hours) at 08/26/2018 1833 Last data filed at 08/26/2018 1752 Gross per 24 hour  Intake 315 ml  Output -  Net 315 ml    Labs/Imaging Results for orders placed or performed during the hospital  encounter of 08/26/18 (from the past 48 hour(s))  SARS Coronavirus 2 (CEPHEID - Performed in [facility] hospital lab), Hosp Order     Status: None   Collection Time: 08/26/18  1:19 PM  Result Value Ref Range   SARS Coronavirus 2 NEGATIVE NEGATIVE    Comment: (NOTE) If result is NEGATIVE SARS-CoV-2 target nucleic acids are NOT DETECTED. The SARS-CoV-2 RNA is generally detectable in upper and lower  respiratory specimens during the acute phase of infection. The lowest  concentration of SARS-CoV-2 viral copies this assay can detect is 250  copies / mL. A negative result does not preclude SARS-CoV-2 infection  and should not be used as the sole basis for treatment or other  patient management decisions.  A negative result may occur with  improper specimen collection / handling, submission of specimen other  than nasopharyngeal swab, presence of viral mutation(s) within the  areas targeted by this assay, and inadequate number of viral copies  (<250 copies / mL). A negative result must be combined with clinical  observations, patient history, and epidemiological information. If result is POSITIVE SARS-CoV-2 target nucleic acids are DETECTED. The SARS-CoV-2 RNA is generally detectable in upper and lower  respiratory specimens dur ing the acute phase of infection.  Positive  results are indicative of active infection with SARS-CoV-2.  Clinical  correlation with patient history and other diagnostic information is  necessary to determine patient infection status.  Positive results do  not rule out bacterial infection or co-infection with other viruses. If result is PRESUMPTIVE POSTIVE SARS-CoV-2 nucleic acids MAY BE PRESENT.   A presumptive positive result was obtained on the submitted specimen  and confirmed on repeat testing.  While 2019 novel coronavirus  (SARS-CoV-2) nucleic acids may be present in the submitted sample  additional confirmatory testing may be necessary for epidemiological   and / or clinical management purposes  to differentiate between  SARS-CoV-2 and other Sarbecovirus currently known to infect humans.  If clinically indicated additional testing with an alternate test  methodology [phone]) is advised. The SARS-CoV-2 RNA is generally  detectable in upper and lower respiratory sp ecimens during the acute  phase of infection. The expected result is Negative. Fact Sheet for Patients:  StrictlyIdeas.no Fact Sheet for Healthcare Providers: BankingDealers.co.za This test is not yet approved or cleared by the [place] FDA and has been authorized for detection and/or diagnosis of SARS-CoV-2 by FDA under an Emergency Use Authorization (EUA).  This EUA will remain in effect (meaning this test can be used) for the duration of the COVID-19 declaration under Section 564(b)(1) of the Act, 21 U.S.C. section 360bbb-3(b)(1), unless the authorization is terminated or revoked sooner. Performed at [facility], [place] [address], [place], [place] [address]   CBC WITH DIFFERENTIAL     Status: Abnormal   Collection  Time: 08/26/18  1:39 PM  Result Value Ref Range   WBC 7.5 4.0 - 10.5 K/uL   RBC 2.97 (L) 3.87 - 5.11 MIL/uL   Hemoglobin 6.7 (LL) 12.0 - 15.0 g/dL    Comment: REPEATED TO VERIFY THIS CRITICAL RESULT HAS VERIFIED AND BEEN CALLED TO D Corben Auzenne RN BY AMANDA LEONARD ON 05 29 2020 AT 1412, AND HAS BEEN READ BACK.     HCT 23.2 (L) 36.0 - 46.0 %   MCV 78.1 (L) 80.0 - 100.0 fL   MCH 22.6 (L) 26.0 - 34.0 pg   MCHC 28.9 (L) 30.0 - 36.0 g/dL   RDW 18.3 (H) 11.5 - 15.5 %   Platelets 393 150 - 400 K/uL   nRBC 0.3 (H) 0.0 - 0.2 %   Neutrophils Relative % 80 %   Neutro Abs 6.0 1.7 - 7.7 K/uL   Lymphocytes Relative 11 %   Lymphs Abs 0.8 0.7 - 4.0 K/uL   Monocytes Relative 7 %   Monocytes Absolute 0.5 0.1 - 1.0 K/uL   Eosinophils Relative 1 %   Eosinophils Absolute 0.0 0.0 - 0.5 K/uL   Basophils Relative 0 %    Basophils Absolute 0.0 0.0 - 0.1 K/uL   Immature Granulocytes 1 %   Abs Immature Granulocytes 0.05 0.00 - 0.07 K/uL    Comment: Performed at [facility] [address] [place], [place] [address]  Brain natriuretic peptide     Status: Abnormal   Collection Time: 08/26/18  1:39 PM  Result Value Ref Range   B Natriuretic Peptide 1,280.5 (H) 0.0 - 100.0 pg/mL    Comment: Performed at [facility] [address], [place], [place] [address]  Comprehensive metabolic panel     Status: Abnormal   Collection Time: 08/26/18  1:39 PM  Result Value Ref Range   Sodium 139 135 - 145 mmol/L   Potassium 3.6 3.5 - 5.1 mmol/L   Chloride 100 98 - 111 mmol/L   CO2 24 22 - 32 mmol/L   Glucose, Bld 146 (H) 70 - 99 mg/dL   BUN 9 8 - 23 mg/dL   Creatinine, Ser 0.81 0.44 - 1.00 mg/dL   Calcium 10.0 8.9 - 10.3 mg/dL   Total Protein 8.5 (H) 6.5 - 8.1 g/dL   Albumin 4.3 3.5 - 5.0 g/dL   AST 24 15 - 41 U/L   ALT 21 0 - 44 U/L   Alkaline Phosphatase 115 38 - 126 U/L   Total Bilirubin 1.4 (H) 0.3 - 1.2 mg/dL   GFR calc non Af Amer >60 >60 mL/min   GFR calc Af Amer >60 >60 mL/min   Anion gap 15 5 - 15    Comment: Performed at [facility] [address] [place], [place] [address]  Troponin I - ONCE - STAT     Status: Abnormal   Collection Time: 08/26/18  1:39 PM  Result Value Ref Range   Troponin I 0.26 (HH) <0.03 ng/mL    Comment: CRITICAL RESULT CALLED TO, READ BACK BY AND VERIFIED WITH: D.Tiwan Schnitker,RN 1456 08/26/2018 CLARK,S Performed at [facility], [place] [address], [place], [place] [address]   Vitamin B12     Status: None   Collection Time: 08/26/18  2:42 PM  Result Value Ref Range   Vitamin B-12 184 180 - 914 pg/mL    Comment: (NOTE) This assay is not validated for testing neonatal or myeloproliferative syndrome specimens for Vitamin B12 levels. Performed  at [facility], [place] [address] [place], [place] [address]   Folate     Status: None   Collection Time:  08/26/18  2:42 PM  Result Value Ref Range   Folate 7.7 >5.9 ng/mL    Comment: Performed at [facility], [place] [address], [place], [place] [address]  Iron and TIBC     Status: Abnormal   Collection Time: 08/26/18  2:42 PM  Result Value Ref Range   Iron 9 (L) 28 - 170 ug/dL   TIBC 424 250 - 450 ug/dL   Saturation Ratios 2 (L) 10.4 - 31.8 %   UIBC 415 ug/dL    Comment: Performed at [facility], [place] [address], [place], [place] [address]  Ferritin     Status: Abnormal   Collection Time: 08/26/18  2:42 PM  Result Value Ref Range   Ferritin 8 (L) 11 - 307 ng/mL    Comment: Performed at [facility], [place] [address], [place], [place] [address]  Reticulocytes     Status: Abnormal   Collection Time: 08/26/18  2:42 PM  Result Value Ref Range   Retic Ct Pct 1.7 0.4 - 3.1 %   RBC. 3.03 (L) 3.87 - 5.11 MIL/uL   Retic Count, Absolute 52.7 19.0 - 186.0 K/uL   Immature Retic Fract 29.9 (H) 2.3 - 15.9 %    Comment: Performed at [facility] [address], [place], [place] [address]  Type and screen [place]     Status: None (Preliminary result)   Collection Time: 08/26/18  3:07 PM  Result Value Ref Range   ABO/RH(D) O POS    Antibody Screen NEG    Sample Expiration 08/29/2018,2359    Unit Number [id number]    Blood Component Type RED CELLS,LR    Unit division 00    Status of Unit ISSUED    Transfusion Status OK TO TRANSFUSE    Crossmatch Result      Compatible Performed at [facility], [place] [address] [place], [place] [address]    Unit Number [id number]    Blood Component Type RED CELLS,LR    Unit division 00    Status of Unit ALLOCATED    Transfusion Status OK TO TRANSFUSE    Crossmatch Result Compatible   POC occult blood, ED Provider will collect     Status: None   Collection Time: 08/26/18  3:20 PM  Result Value Ref Range   Fecal Occult Bld NEGATIVE NEGATIVE  Prepare RBC     Status: None   Collection Time: 08/26/18  4:08  PM  Result Value Ref Range   Order Confirmation      ORDER PROCESSED BY BLOOD BANK Performed at [facility], [place] [address] [place], [place] [address]    Dg Chest Portable 1 View  Result Date: 08/26/2018 CLINICAL DATA:  Shortness of breath. EXAM: PORTABLE CHEST 1 VIEW COMPARISON:  Radiographs of Aug 09, 2018. FINDINGS: Stable cardiomediastinal silhouette. Left lung is clear. No pneumothorax is noted. Mild right basilar atelectasis or infiltrate is noted with increased right pleural effusion. Bony thorax is unremarkable. IMPRESSION: Mild right basilar atelectasis or infiltrate is noted with increased right pleural effusion. Electronically Signed   By: Marijo Conception M.D.   On: 08/26/2018 15:51    Pending Labs Unresulted Labs (From admission, onward)   None      Vitals/Pain Today's Vitals   08/26/18 1800 08/26/18  1807 08/26/18 1820 08/26/18 1830  BP: (!) 172/69 (!) 170/69 (!) 163/83 (!) 164/72  Pulse: (!) 57 68 60 (!) 59  Resp: 18 (!) 21 (!) 21 (!) 30  Temp:  98.2 F (36.8 C) 97.8 F (36.6 C)   TempSrc:  Oral Oral   SpO2: 100% 100% 100% 100%    Isolation Precautions No active isolations  Medications Medications  0.9 %  sodium chloride infusion (10 mL/hr Intravenous New Bag/Given 08/26/18 1752)    Mobility walks Moderate fall risk   Focused Assessments    R Recommendations: See Admitting Provider Note  Report given to:   Additional Notes:

## 2018-08-26 NOTE — Telephone Encounter (Signed)
DOD call   Spoke with Judeen Hammans APP at PCP office concerning patient of Dr. Martinique. Patient is in office, she is contemplating sending to ED. Patient was 83% when she arrived to office with significant SOB & DOE. She has new pitting edema, swelling in knees. She has diminished lung sounds. She has no h/o CHF but has new lung cancer diagnosis with right lobectomy in January, thoracentesis with 1.2L removed in March. She called [facility] but patient cannot be seen there. Advised her that I would notify DOD Dr. Debara Pickett. Agreed with Judeen Hammans APP that patient should go to ED [facility]) for evaluation of acute symptoms. Dr. Debara Pickett agreed that patient should go to ED for evaluation  Call back # [phone].  Routed to Dr. Martinique as Juluis Rainier

## 2018-08-26 NOTE — H&P (Signed)
History and Physical    Maria Arroyo DOB: Sep 21, 1950 DOA: 08/26/2018  PCP: [name], MD  Patient coming from: Home  I have personally briefly reviewed patient's old medical records in [place]  Chief Complaint: Shortness of breath  HPI: Maria Arroyo is a 68 y.o. female with medical history significant for CAD, COPD on supplemental O2 as needed, Afib on Eliquis, Squamous cell Lung Cancer s/p Rt lower lobectomy, HTN, HLD, Depression who presents to the ED with 2 weeks of progressive dyspnea on exertion and swelling in both of her legs.  She has had associated generalized weakness.  She has doubled her home Lasix without significant improvement.  COPD and uses supplemental oxygen as needed but has had to use it more frequently recently.  She takes Eliquis for history of A. fib and aspirin for history of nonobstructive CAD.  She denies any obvious bleeding including epistaxis, hemoptysis, hematemesis, hematuria, BRBPR, or melena.  She denies any associated chest pain, palpitations, nausea, vomiting, diarrhea, abdominal pain, or dysuria.  Patient last had a colonoscopy on 10/05/2017 which per documentation in care everywhere showed sigmoid diverticulosis and a 1 cm sigmoid polyp removed with snare cautery and a clip was placed.  ED Course:  Initial vitals showed BP 166/84, pulse 57, RR 18, temp 98.2 Fahrenheit, SPO2 100% on 4 L supplemental O2 via nasal cannula.  Labs are notable for hemoglobin 6.7, MCV 78.1, platelets 393,000, WBC 7.5, Ferritin 8, iron 9, TIBC 424, 2% saturation, B12 184, folate 7.7, BUN 9, creatinine 0.81, BNP 1280.5, troponin I 0.26.  FOBT was negative.  Portable chest x-ray showed an increased right pleural effusion.  Patient was started on transfusion of 2 units PRBCs and the hospitalist service was consulted to admit for further evaluation management of symptomatic anemia.   Review of Systems: All systems reviewed and are negative  except as documented in history of present illness above.   Past Medical History:  Diagnosis Date  . Cancer ([place])    lung  . Cervical disc disease   . COPD (chronic obstructive pulmonary disease) ([place])   . Coronary artery disease   . Depression   . Gout    R foot and knee  . Hyperlipidemia   . Hypertension   . Neuropathy   . OA (osteoarthritis)   . Tobacco abuse     Past Surgical History:  Procedure Laterality Date  . ABDOMINAL HYSTERECTOMY    . ANTERIOR CERVICAL DECOMP/DISCECTOMY FUSION  02/06/2011   Procedure: ANTERIOR CERVICAL DECOMPRESSION/DISCECTOMY FUSION 2 LEVELS;  Surgeon: Winfield Cunas;  Location: [facility];  Service: Neurosurgery;  Laterality: N/A;  Anterior Cervical Four-Five/Five-Six Decompression and Fusion with Plating and Bonegraft  . BLADDER SURGERY    . CARDIAC CATHETERIZATION  04/09/2009   EF 60%  . CARDIAC CATHETERIZATION  03/24/2001   EF 55%  . CARDIAC CATHETERIZATION  01/06/2001   EF 65%  . CHOLECYSTECTOMY    . CORONARY STENT PLACEMENT  03/2009   STENTING OF THE PROXIMAL TO MID RIGHT CORONARY  . IR THORACENTESIS ASP PLEURAL SPACE W/IMG GUIDE  06/01/2018  . LEFT HEART CATHETERIZATION WITH CORONARY ANGIOGRAM N/A 12/21/2011   Procedure: LEFT HEART CATHETERIZATION WITH CORONARY ANGIOGRAM;  Surgeon: Peter M Martinique, MD;  Location: [facility];  Service: Cardiovascular;  Laterality: N/A;  . LOBECTOMY    . NODE DISSECTION N/A 04/04/2018   Procedure: NODE DISSECTION;  Surgeon: Melrose Nakayama, MD;  Location: [facility];  Service: Thoracic;  Laterality: N/A;  . OTHER SURGICAL HISTORY  12/2014   R foot cyst removal  . VIDEO ASSISTED THORACOSCOPY (VATS)/ LOBECTOMY Right 04/04/2018   Procedure: VIDEO ASSISTED THORACOSCOPY (VATS)/RIGHT LOWER LOBECTOMY;  Surgeon: Melrose Nakayama, MD;  Location: [facility] OR;  Service: Thoracic;  Laterality: Right;    Social History:  reports that she has been smoking cigarettes. She has a 70.00 pack-year smoking history. She has never  used smokeless tobacco. She reports that she does not drink alcohol or use drugs.  Allergies  Allergen Reactions  . Ace Inhibitors Other (See Comments)    Worsening renal function   . Codeine Nausea Only  . Codeine-Guaifenesin [Guaifenesin-Codeine] Nausea Only and Other (See Comments)    Some nausea--but ok with percocet    Family History  Problem Relation Age of Onset  . Aneurysm Mother   . Heart attack Father   . Heart failure Father   . Stroke Sister   . Heart attack Brother      Prior to Admission medications   Medication Sig Start Date End Date Taking? Authorizing Provider  acetaminophen (TYLENOL) 500 MG tablet Take 2 tablets (1,000 mg total) by mouth every 6 (six) hours. 04/09/18   Elgie Collard, PA-C  allopurinol (ZYLOPRIM) 100 MG tablet Take 200 mg by mouth daily.    [provider]  amiodarone (PACERONE) 200 MG tablet Take 2 tabs (400 mg)  twice daily for 14 days, then 1 tab (200 mg) daily thereafter. 04/09/18   Elgie Collard, PA-C  amLODipine (NORVASC) 5 MG tablet Take 1 tablet (5 mg total) by mouth daily. 05/05/18 08/03/18  Almyra Deforest, PA  apixaban (ELIQUIS) 5 MG TABS tablet Take 1 tablet (5 mg total) by mouth 2 (two) times daily. 04/09/18   Elgie Collard, PA-C  aspirin 81 MG tablet Take 81 mg by mouth daily.    [provider]  bisoprolol (ZEBETA) 5 MG tablet Take 0.5 tablets (2.5 mg total) by mouth daily. 04/22/18   Almyra Deforest, PA  busPIRone (BUSPAR) 15 MG tablet Take 15 mg by mouth 2 (two) times daily.    [provider]  Calcium Carbonate-Vitamin D (CALCIUM 600 + D PO) Take 1 tablet by mouth daily.     [provider]  carisoprodol (SOMA) 350 MG tablet Take 1 tablet by mouth 4 (four) times daily as needed for muscle spasms. 04/15/18   [provider]  Cholecalciferol (VITAMIN D) 2000 UNITS tablet Take 4,000 Units by mouth daily.     [provider]  furosemide (LASIX) 20 MG tablet TAKE 2 TABLETS(40 MG) BY MOUTH DAILY  08/09/18   Melrose Nakayama, MD  gabapentin (NEURONTIN) 600 MG tablet TAKE 1 TABLET(600 MG) BY MOUTH FOUR TIMES DAILY AT 8 AM AND AT 1 PM AND AT 6 PM AND AT 10 PM 04/25/18   Dennie Bible, NP  Multiple Vitamin (MULTIVITAMIN) tablet Take 1 tablet by mouth daily.     [provider]  nitroGLYCERIN (NITROSTAT) 0.4 MG SL tablet Place 1 tablet (0.4 mg total) under the tongue every 5 (five) minutes as needed. For chest pain Patient taking differently: Place 0.4 mg under the tongue every 5 (five) minutes as needed for chest pain.  08/03/16   Martinique, Peter M, MD  oxyCODONE-acetaminophen (PERCOCET) 10-325 MG tablet Take 1 tablet by mouth 4 (four) times daily as needed for pain. 04/13/18   [provider]  PROVENTIL HFA 108 (90 Base) MCG/ACT inhaler Inhale 1-2 puffs into the  lungs every 4 (four) hours as needed for wheezing or shortness of breath.  03/26/16   [provider]  rosuvastatin (CRESTOR) 40 MG tablet Take 1 tablet (40 mg total) by mouth daily. 04/29/18 07/28/18  Martinique, Peter M, MD  venlafaxine XR (EFFEXOR-XR) 75 MG 24 hr capsule Take 75 mg by mouth 2 (two) times daily.     [provider]    Physical Exam: Vitals:   08/26/18 1949 08/26/18 2151 08/26/18 2249 08/26/18 2317  BP: (!) 187/78 (!) 176/72 (!) 157/93 (!) 166/61  Pulse: (!) 58 60 63 60  Resp: 20     Temp:  99.1 F (37.3 C) 99.3 F (37.4 C) 98.2 F (36.8 C)  TempSrc:  Oral Oral Oral  SpO2: 100% 100% 100% 99%    Constitutional: Obese woman resting supine in bed, NAD, calm, comfortable Eyes: PERRL, lids and conjunctivae normal ENMT: Mucous membranes are moist. Posterior pharynx clear of any exudate or lesions. Neck: normal, supple, no masses. Respiratory: Decreased breath sounds right lower lung field, faint expiratory wheezing upper and left lower lung fields.  Normal respiratory effort. No accessory muscle use.  Cardiovascular: Regular rate and rhythm, no murmurs / rubs / gallops.  +1  pitting edema both legs. 2+ pedal pulses. Abdomen: no tenderness, no masses palpated. No hepatosplenomegaly. Bowel sounds positive.  Musculoskeletal: no clubbing / cyanosis. No joint deformity upper and lower extremities. Good ROM, no contractures. Normal muscle tone.  Skin: no rashes, lesions, ulcers. No induration Neurologic: CN 2-12 grossly intact. Sensation intact, Strength 5/5 in all 4.  Psychiatric: Normal judgment and insight. Alert and oriented x 3. Normal mood.     Labs on Admission: I have personally reviewed following labs and imaging studies  CBC: Recent Labs  Lab 08/26/18 1339  WBC 7.5  NEUTROABS 6.0  HGB 6.7*  HCT 23.2*  MCV 78.1*  PLT 867   Basic Metabolic Panel: Recent Labs  Lab 08/26/18 1339  NA 139  K 3.6  CL 100  CO2 24  GLUCOSE 146*  BUN 9  CREATININE 0.81  CALCIUM 10.0   GFR: CrCl cannot be calculated (Unknown ideal weight.). Liver Function Tests: Recent Labs  Lab 08/26/18 1339  AST 24  ALT 21  ALKPHOS 115  BILITOT 1.4*  PROT 8.5*  ALBUMIN 4.3   No results for input(s): LIPASE, AMYLASE in the last 168 hours. No results for input(s): AMMONIA in the last 168 hours. Coagulation Profile: No results for input(s): INR, PROTIME in the last 168 hours. Cardiac Enzymes: Recent Labs  Lab 08/26/18 1339  TROPONINI 0.26*   BNP (last 3 results) No results for input(s): PROBNP in the last 8760 hours. HbA1C: No results for input(s): HGBA1C in the last 72 hours. CBG: No results for input(s): GLUCAP in the last 168 hours. Lipid Profile: No results for input(s): CHOL, HDL, LDLCALC, TRIG, CHOLHDL, LDLDIRECT in the last 72 hours. Thyroid Function Tests: No results for input(s): TSH, T4TOTAL, FREET4, T3FREE, THYROIDAB in the last 72 hours. Anemia Panel: Recent Labs    08/26/18 1442  VITAMINB12 184  FOLATE 7.7  FERRITIN 8*  TIBC 424  IRON 9*  RETICCTPCT 1.7   Urine analysis:    Component Value Date/Time   COLORURINE YELLOW 03/31/2018 0940    APPEARANCEUR CLEAR 03/31/2018 0940   LABSPEC 1.017 03/31/2018 0940   PHURINE 5.0 03/31/2018 0940   GLUCOSEU NEGATIVE 03/31/2018 0940   HGBUR NEGATIVE 03/31/2018 0940   BILIRUBINUR NEGATIVE 03/31/2018 0940   KETONESUR NEGATIVE 03/31/2018 0940  PROTEINUR NEGATIVE 03/31/2018 0940   NITRITE NEGATIVE 03/31/2018 0940   LEUKOCYTESUR NEGATIVE 03/31/2018 0940    Radiological Exams on Admission: Dg Chest Portable 1 View  Result Date: 08/26/2018 CLINICAL DATA:  Shortness of breath. EXAM: PORTABLE CHEST 1 VIEW COMPARISON:  Radiographs of Aug 09, 2018. FINDINGS: Stable cardiomediastinal silhouette. Left lung is clear. No pneumothorax is noted. Mild right basilar atelectasis or infiltrate is noted with increased right pleural effusion. Bony thorax is unremarkable. IMPRESSION: Mild right basilar atelectasis or infiltrate is noted with increased right pleural effusion. Electronically Signed   By: Marijo Conception M.D.   On: 08/26/2018 15:51    EKG: Independently reviewed. Sinus rhythm, borderline prolonged PR interval, late R wave transition, no acute ischemic changes, motion artifact present.  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   Symptomatic anemia: Likely multifactorial from aspirin and Eliquis use, iron deficiency, and B12 deficiency.  FOBT is negative and she denies any obvious visualized bleeding. -Transfusing 2 units PRBCs, recheck labs in  a.m. -Monitor for signs/symptoms of further bleeding -IV iron transfusion and IM B12 injection ordered for a.m. -Hold Eliquis and aspirin  Squamous cell lung cancer s/p right lower lobectomy with recurrent pleural effusion: Portable x-ray with increased right pleural effusion. -Start IV Lasix 40 mg twice daily -Consider repeat thoracentesis if not having significant improvement  Suspected diastolic CHF exacerbation: Patient with progressive lower extremity edema and elevated BNP.  Echocardiogram January 2020 was unable to assess diastolic parameters. -IV Lasix as above -Daily weights, strict I/O's -Continue bisoprolol  Elevated troponin and history of nonobstructive CAD: Suspect troponin elevation due to demand ischemia from anemia.  We will continue to trend cardiac enzymes.  Holding aspirin as above.  Continuing bisoprolol and rosuvastatin.  COPD: Has faint wheezing on admission. -Continue albuterol nebulizers as needed  Atrial fibrillation: In sinus rhythm with controlled rate on admission.  Holding Eliquis due to anemia.  Continue bisoprolol and amiodarone.  Hypertension: -Continue home bisoprolol, IV Lasix as above  Hyperlipidemia: -Continue rosuvastatin  Depression: -Continue buspirone and Effexor  Tobacco use: Patient continues smoke, smoking cessation counseling provided.  Nicotine patch ordered.   DVT prophylaxis: SCDs  Code Status: Full code, confirmed patient Family Communication: Patient states she will discuss with her husband herself Disposition Plan: Pending clinical progress Consults called: None Admission status: Inpatient, patient requires greater than 2 midnight length stay for further evaluation management of symptomatic anemia she is high risk for decompensation due to current comorbidities which include atrial fibrillation, squamous cell lung cancer with recurrent right pleural effusion, hypertension, and COPD.   Zada Finders MD [facility]   If 7PM-7AM, please contact night-coverage [email or web address]  08/27/2018, 12:33 AM

## 2018-08-26 NOTE — Plan of Care (Signed)
  Problem: Education: Goal: Knowledge of General Education information will improve Description Including pain rating scale, medication(s)/side effects and non-pharmacologic comfort measures Outcome: Progressing   Problem: Health Behavior/Discharge Planning: Goal: Ability to manage health-related needs will improve Outcome: Progressing   

## 2018-08-26 NOTE — ED Notes (Signed)
Please call husband this is his correct number  [phone]

## 2018-08-26 NOTE — ED Triage Notes (Signed)
Was at dr office with c/o sob  States having increased sob x 3 weeks states still smokes , having increasing  Feet to knees doubled her lasix 3 weeks ago per dr office sob on exertion

## 2018-08-26 NOTE — ED Provider Notes (Signed)
[facility] EMERGENCY DEPARTMENT Provider Note   CSN: [id number] Arrival date & time:       History   Chief Complaint Chief Complaint  Patient presents with  . Shortness of Breath    HPI Maria Arroyo is a 68 y.o. female with history of COPD, CAD, hypertension, tobacco abuse, lung cancer presented emergency department today with chief complaint of shortness of breath x3 weeks. Pt on Eliquis.  Patient states she went to her doctor's office this morning and was sent here for further evaluation.  At the appointment her SPO2 on room air was 88%.  Patient states she wears oxygen at home when needed, usually 2 L. Patient states her shortness of breath has been increasing since onset.  She is unable to walk short distances without having to stop to catch her breath. She has doubled her home Lasix, taking 40mg  since symptom onset without any improvement. Pt reports worsening lower extremity edema and generalized weakness. She has been craving ice lately, she thinks for at least 2 months.  She denies any chest pain, palpitations, fever, cough, hematuria, diarrhea, melena. No history of GI bleed. History provided by patient with additional history obtained from chart review.      Past Medical History:  Diagnosis Date  . Cancer ([place])    lung  . Cervical disc disease   . COPD (chronic obstructive pulmonary disease) ([place])   . Coronary artery disease   . Depression   . Gout    R foot and knee  . Hyperlipidemia   . Hypertension   . Neuropathy   . OA (osteoarthritis)   . Tobacco abuse     Patient Active Problem List   Diagnosis Date Noted  . 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 S/P lobectomy of lung 04/04/2018  . Solitary pulmonary nodule 02/04/2018  . Neuropathy 03/18/2017  . Disturbance of skin sensation 03/10/2013  . Cervical spondylosis with myelopathy 03/10/2013  . Abnormality of gait 03/10/2013  . Blood glucose elevated  11/18/2012  . Arthritis 02/02/2011  . Chronic pain 02/02/2011  . Fibrositis 02/02/2011  . Coronary artery disease   . COPD GOLD II if use FEV1/VC ratio    . Essential hypertension   . Hyperlipidemia   . Cigarette smoker   . Depression     Past Surgical History:  Procedure Laterality Date  . ABDOMINAL HYSTERECTOMY    . ANTERIOR CERVICAL DECOMP/DISCECTOMY FUSION  02/06/2011   Procedure: ANTERIOR CERVICAL DECOMPRESSION/DISCECTOMY FUSION 2 LEVELS;  Surgeon: Winfield Cunas;  Location: [facility];  Service: Neurosurgery;  Laterality: N/A;  Anterior Cervical Four-Five/Five-Six Decompression and Fusion with Plating and Bonegraft  . BLADDER SURGERY    . CARDIAC CATHETERIZATION  04/09/2009   EF 60%  . CARDIAC CATHETERIZATION  03/24/2001   EF 55%  . CARDIAC CATHETERIZATION  01/06/2001   EF 65%  . CHOLECYSTECTOMY    . CORONARY STENT PLACEMENT  03/2009   STENTING OF THE PROXIMAL TO MID RIGHT CORONARY  . IR THORACENTESIS ASP PLEURAL SPACE W/IMG GUIDE  06/01/2018  . LEFT HEART CATHETERIZATION WITH CORONARY ANGIOGRAM N/A 12/21/2011   Procedure: LEFT HEART CATHETERIZATION WITH CORONARY ANGIOGRAM;  Surgeon: Peter M Martinique, MD;  Location: [facility] CATH LAB;  Service: Cardiovascular;  Laterality: N/A;  . LOBECTOMY    . NODE DISSECTION N/A 04/04/2018   Procedure: NODE DISSECTION;  Surgeon: Melrose Nakayama, MD;  Location: [facility];  Service: Thoracic;  Laterality: N/A;  .  OTHER SURGICAL HISTORY  12/2014   R foot cyst removal  . VIDEO ASSISTED THORACOSCOPY (VATS)/ LOBECTOMY Right 04/04/2018   Procedure: VIDEO ASSISTED THORACOSCOPY (VATS)/RIGHT LOWER LOBECTOMY;  Surgeon: Melrose Nakayama, MD;  Location: [facility];  Service: Thoracic;  Laterality: Right;     OB History   No obstetric history on file.      Home Medications    Prior to Admission medications   Medication Sig Start Date End Date Taking? Authorizing Provider  acetaminophen (TYLENOL) 500 MG tablet Take 2 tablets (1,000 mg total) by mouth  every 6 (six) hours. 04/09/18   Elgie Collard, PA-C  allopurinol (ZYLOPRIM) 100 MG tablet Take 200 mg by mouth daily.    [provider]  amiodarone (PACERONE) 200 MG tablet Take 2 tabs (400 mg)  twice daily for 14 days, then 1 tab (200 mg) daily thereafter. 04/09/18   Elgie Collard, PA-C  amLODipine (NORVASC) 5 MG tablet Take 1 tablet (5 mg total) by mouth daily. 05/05/18 08/03/18  Almyra Deforest, PA  apixaban (ELIQUIS) 5 MG TABS tablet Take 1 tablet (5 mg total) by mouth 2 (two) times daily. 04/09/18   Elgie Collard, PA-C  aspirin 81 MG tablet Take 81 mg by mouth daily.    [provider]  bisoprolol (ZEBETA) 5 MG tablet Take 0.5 tablets (2.5 mg total) by mouth daily. 04/22/18   Almyra Deforest, PA  busPIRone (BUSPAR) 15 MG tablet Take 15 mg by mouth 2 (two) times daily.    [provider]  Calcium Carbonate-Vitamin D (CALCIUM 600 + D PO) Take 1 tablet by mouth daily.     [provider]  carisoprodol (SOMA) 350 MG tablet Take 1 tablet by mouth 4 (four) times daily as needed for muscle spasms. 04/15/18   [provider]  Cholecalciferol (VITAMIN D) 2000 UNITS tablet Take 4,000 Units by mouth daily.     [provider]  furosemide (LASIX) 20 MG tablet TAKE 2 TABLETS(40 MG) BY MOUTH DAILY 08/09/18   Melrose Nakayama, MD  gabapentin (NEURONTIN) 600 MG tablet TAKE 1 TABLET(600 MG) BY MOUTH FOUR TIMES DAILY AT 8 AM AND AT 1 PM AND AT 6 PM AND AT 10 PM 04/25/18   Dennie Bible, NP  Multiple Vitamin (MULTIVITAMIN) tablet Take 1 tablet by mouth daily.     [provider]  nitroGLYCERIN (NITROSTAT) 0.4 MG SL tablet Place 1 tablet (0.4 mg total) under the tongue every 5 (five) minutes as needed. For chest pain Patient taking differently: Place 0.4 mg under the tongue every 5 (five) minutes as needed for chest pain.  08/03/16   Martinique, Peter M, MD  oxyCODONE-acetaminophen (PERCOCET) 10-325 MG tablet Take 1 tablet by mouth 4 (four) times daily as needed  for pain. 04/13/18   [provider]  PROVENTIL HFA 108 (90 Base) MCG/ACT inhaler Inhale 1-2 puffs into the lungs every 4 (four) hours as needed for wheezing or shortness of breath.  03/26/16   [provider]  rosuvastatin (CRESTOR) 40 MG tablet Take 1 tablet (40 mg total) by mouth daily. 04/29/18 07/28/18  Martinique, Peter M, MD  venlafaxine XR (EFFEXOR-XR) 75 MG 24 hr capsule Take 75 mg by mouth 2 (two) times daily.     [provider]    Family History Family History  Problem Relation Age of Onset  . Aneurysm Mother   . Heart attack Father   . Heart failure Father   . Stroke Sister   .  Heart attack Brother     Social History Social History   Tobacco Use  . Smoking status: Current Every Day Smoker    Packs/day: 2.00    Years: 35.00    Pack years: 70.00    Types: Cigarettes  . Smokeless tobacco: Never Used  . Tobacco comment: 1-1.5 ppd (10/02/13), 03/17/18 2 PPD  Substance Use Topics  . Alcohol use: No    Alcohol/week: 0.0 standard drinks  . Drug use: No     Allergies   Ace inhibitors; Codeine; and Codeine-guaifenesin [guaifenesin-codeine]   Review of Systems Review of Systems  Constitutional: Negative for chills and fever.  HENT: Negative for congestion, ear discharge, ear pain, sinus pressure, sinus pain and sore throat.   Eyes: Negative for pain and redness.  Respiratory: Positive for shortness of breath. Negative for cough.   Cardiovascular: Positive for leg swelling. Negative for chest pain.  Gastrointestinal: Negative for abdominal pain, constipation, diarrhea, nausea and vomiting.  Genitourinary: Negative for dysuria and hematuria.  Musculoskeletal: Negative for back pain and neck pain.  Skin: Negative for wound.  Neurological: Negative for weakness, numbness and headaches.     Physical Exam Updated Vital Signs BP (!) 161/63 (BP Location: Right Arm)   Pulse (!) 56   Temp 97.9 F (36.6 C) (Oral)   Resp 19   SpO2 100%   Physical  Exam Vitals signs and nursing note reviewed.  Constitutional:      General: She is not in acute distress.    Appearance: She is not ill-appearing.  HENT:     Head: Normocephalic and atraumatic.     Right Ear: External ear normal.     Left Ear: External ear normal.     Nose: Nose normal.     Mouth/Throat:     Mouth: Mucous membranes are dry.     Pharynx: Oropharynx is clear.  Eyes:     General: No scleral icterus.       Right eye: No discharge.        Left eye: No discharge.     Extraocular Movements: Extraocular movements intact.     Conjunctiva/sclera: Conjunctivae normal.     Pupils: Pupils are equal, round, and reactive to light.  Neck:     Musculoskeletal: Normal range of motion.     Vascular: No JVD.  Cardiovascular:     Rate and Rhythm: Normal rate and regular rhythm.     Pulses: Normal pulses.          Radial pulses are 2+ on the right side and 2+ on the left side.     Heart sounds: Normal heart sounds.  Pulmonary:     Comments: Expiratory wheezing heard in all lung fields symmetric chest rise. Mild accessory muscle use. SpO2 on room air is 85%. Chest:     Chest wall: No tenderness.  Abdominal:     Comments: Abdomen is soft, non-distended, and non-tender in all quadrants. No rigidity, no guarding. No peritoneal signs.  Genitourinary:    Comments: Chaperone NT present for exam. Digital Rectal Exam reveals sphincter with good tone. No external hemorrhoids. No masses or fissures. Stool color is brown with no overt blood. No gross melena.  Musculoskeletal: Normal range of motion.     Right lower leg: 2+ Pitting Edema present.     Left lower leg: 2+ Pitting Edema present.  Skin:    General: Skin is warm and dry.     Capillary Refill: Capillary refill takes less than 2 seconds.  Neurological:     Mental Status: She is oriented to person, place, and time.     GCS: GCS eye subscore is 4. GCS verbal subscore is 5. GCS motor subscore is 6.     Comments: Fluent speech, no  facial droop.  Psychiatric:        Behavior: Behavior normal.      ED Treatments / Results  Labs (all labs ordered are listed, but only abnormal results are displayed) Labs Reviewed  CBC WITH DIFFERENTIAL/PLATELET - Abnormal; Notable for the following components:      Result Value   RBC 2.97 (*)    Hemoglobin 6.7 (*)    HCT 23.2 (*)    MCV 78.1 (*)    MCH 22.6 (*)    MCHC 28.9 (*)    RDW 18.3 (*)    nRBC 0.3 (*)    All other components within normal limits  BRAIN NATRIURETIC PEPTIDE - Abnormal; Notable for the following components:   B Natriuretic Peptide 1,280.5 (*)    All other components within normal limits  COMPREHENSIVE METABOLIC PANEL - Abnormal; Notable for the following components:   Glucose, Bld 146 (*)    Total Protein 8.5 (*)    Total Bilirubin 1.4 (*)    All other components within normal limits  TROPONIN I - Abnormal; Notable for the following components:   Troponin I 0.26 (*)    All other components within normal limits  IRON AND TIBC - Abnormal; Notable for the following components:   Iron 9 (*)    Saturation Ratios 2 (*)    All other components within normal limits  FERRITIN - Abnormal; Notable for the following components:   Ferritin 8 (*)    All other components within normal limits  RETICULOCYTES - Abnormal; Notable for the following components:   RBC. 3.03 (*)    Immature Retic Fract 29.9 (*)    All other components within normal limits  SARS CORONAVIRUS 2 (HOSPITAL ORDER, [facility])  VITAMIN B12  FOLATE  POC OCCULT BLOOD, ED  TYPE AND SCREEN  PREPARE RBC (CROSSMATCH)    EKG EKG Interpretation  Date/Time:  Friday Aug 26 2018 13:07:07 EDT Ventricular Rate:  60 PR Interval:    QRS Duration: 113 QT Interval:  473 QTC Calculation: 473 R Axis:   -177 Text Interpretation:  Sinus rhythm Prolonged PR interval Borderline intraventricular conduction delay Abnormal R-wave progression, late transition No STEMI   Confirmed by Nanda Quinton [phone]) on 08/26/2018 3:32:35 PM   Radiology Dg Chest Portable 1 View  Result Date: 08/26/2018 CLINICAL DATA:  Shortness of breath. EXAM: PORTABLE CHEST 1 VIEW COMPARISON:  Radiographs of Aug 09, 2018. FINDINGS: Stable cardiomediastinal silhouette. Left lung is clear. No pneumothorax is noted. Mild right basilar atelectasis or infiltrate is noted with increased right pleural effusion. Bony thorax is unremarkable. IMPRESSION: Mild right basilar atelectasis or infiltrate is noted with increased right pleural effusion. Electronically Signed   By: Marijo Conception M.D.   On: 08/26/2018 15:51    Procedures .Critical Care Performed by: Cherre Robins, PA-C Authorized by: Cherre Robins, PA-C   Critical care provider statement:    Critical care time (minutes):  34   Critical care time was exclusive of:  Separately billable procedures and treating other patients and teaching time   Critical care was necessary to treat or prevent imminent or life-threatening deterioration of the following conditions: symptomatic anemia, HgB<8.   Critical care was time  spent personally by me on the following activities:  Development of treatment plan with patient or surrogate, discussions with consultants, examination of patient, re-evaluation of patient's condition, ordering and review of radiographic studies, ordering and review of laboratory studies and review of old charts   (including critical care time)  Medications Ordered in ED Medications  0.9 %  sodium chloride infusion (has no administration in time range)     Initial Impression / Assessment and Plan / ED Course  I have reviewed the triage vital signs and the nursing notes.  Pertinent labs & imaging results that were available during my care of the patient were reviewed by me and considered in my medical decision making (see chart for details).  68 yo female hx COPD with LV EF 50-55% 03/2018 presents with worsening  dyspnea x 3 weeks. I witnessed her to have significant dyspnea ambulating from EMS stretcher to bed SpO2 on room air 85%. 2L [place] without change, at 3L SpO2 increased to 97%.  On exam she has wheezing in all fields, 2+ bilateral pitting edema. She is also afebrile, non toxic appearing. Labs remarkable for elevated troponin of 0.26, BNP 1280.5. CBC with H/H 6.7/23.2, microcytic anemia with MCV 78.1. Compared to previous labs 4 months prior hemoglobin of 10-11 and no anemia noted. Anemia panel and type and screen added on. Started transfusion 2 units.  Fecal occult negative. Pt with no obvious source of bleeding. EKG viewed by me without ischemic changes. Chest xray viewed by me shows right basilar atelectasis with increased right pleural effusion. Coronavirus test is negative. This case was discussed with Dr. Laverta Baltimore who has seen the patient and agrees with plan to admit for symptomatic anemia. Spoke with Dr. Posey Pronto with hospitalist service who agrees to assume care of patient and bring into the hospital for further evaluation and management.    This note was prepared using Dragon voice recognition software and may include unintentional dictation errors due to the inherent limitations of voice recognition software.  Final Clinical Impressions(s) / ED Diagnoses   Final diagnoses:  Symptomatic anemia    ED Discharge Orders    None       Flint Melter 08/26/18 1816    Margette Fast, MD 08/27/18 1240

## 2018-08-27 ENCOUNTER — Inpatient Hospital Stay (HOSPITAL_COMMUNITY): Payer: Medicare Other

## 2018-08-27 DIAGNOSIS — J449 Chronic obstructive pulmonary disease, unspecified: Secondary | ICD-10-CM

## 2018-08-27 DIAGNOSIS — J90 Pleural effusion, not elsewhere classified: Secondary | ICD-10-CM

## 2018-08-27 DIAGNOSIS — I340 Nonrheumatic mitral (valve) insufficiency: Secondary | ICD-10-CM

## 2018-08-27 DIAGNOSIS — I10 Essential (primary) hypertension: Secondary | ICD-10-CM

## 2018-08-27 DIAGNOSIS — I361 Nonrheumatic tricuspid (valve) insufficiency: Secondary | ICD-10-CM

## 2018-08-27 DIAGNOSIS — F329 Major depressive disorder, single episode, unspecified: Secondary | ICD-10-CM

## 2018-08-27 DIAGNOSIS — I480 Paroxysmal atrial fibrillation: Secondary | ICD-10-CM

## 2018-08-27 LAB — BPAM RBC
Blood Product Expiration Date: 202006022359
Blood Product Expiration Date: 202006272359
ISSUE DATE / TIME: 202005291736
ISSUE DATE / TIME: 202005292239
Unit Type and Rh: 5100
Unit Type and Rh: 9500

## 2018-08-27 LAB — CBC
HCT: 31.6 % — ABNORMAL LOW (ref 36.0–46.0)
HCT: 33 % — ABNORMAL LOW (ref 36.0–46.0)
HCT: 33.2 % — ABNORMAL LOW (ref 36.0–46.0)
Hemoglobin: 9.7 g/dL — ABNORMAL LOW (ref 12.0–15.0)
Hemoglobin: 10 g/dL — ABNORMAL LOW (ref 12.0–15.0)
Hemoglobin: 10 g/dL — ABNORMAL LOW (ref 12.0–15.0)
MCH: 24.5 pg — ABNORMAL LOW (ref 26.0–34.0)
MCH: 24.2 pg — ABNORMAL LOW (ref 26.0–34.0)
MCH: 24.1 pg — ABNORMAL LOW (ref 26.0–34.0)
MCHC: 30.7 g/dL (ref 30.0–36.0)
MCHC: 30.3 g/dL (ref 30.0–36.0)
MCHC: 30.1 g/dL (ref 30.0–36.0)
MCV: 79.8 fL — ABNORMAL LOW (ref 80.0–100.0)
MCV: 79.9 fL — ABNORMAL LOW (ref 80.0–100.0)
MCV: 80 fL (ref 80.0–100.0)
Platelets: 358 10*3/uL (ref 150–400)
Platelets: 355 10*3/uL (ref 150–400)
Platelets: 375 10*3/uL (ref 150–400)
RBC: 3.96 MIL/uL (ref 3.87–5.11)
RBC: 4.13 MIL/uL (ref 3.87–5.11)
RBC: 4.15 MIL/uL (ref 3.87–5.11)
RDW: 16.7 % — ABNORMAL HIGH (ref 11.5–15.5)
RDW: 16.7 % — ABNORMAL HIGH (ref 11.5–15.5)
RDW: 17.2 % — ABNORMAL HIGH (ref 11.5–15.5)
WBC: 8.7 10*3/uL (ref 4.0–10.5)
WBC: 10.7 10*3/uL — ABNORMAL HIGH (ref 4.0–10.5)
WBC: 11 10*3/uL — ABNORMAL HIGH (ref 4.0–10.5)
nRBC: 0.2 % (ref 0.0–0.2)
nRBC: 0 % (ref 0.0–0.2)
nRBC: 0.3 % — ABNORMAL HIGH (ref 0.0–0.2)

## 2018-08-27 LAB — TROPONIN I
Troponin I: 0.06 ng/mL (ref <0.03)
Troponin I: 0.06 ng/mL (ref <0.03)

## 2018-08-27 LAB — BASIC METABOLIC PANEL
Anion gap: 12 (ref 5–15)
BUN: 15 mg/dL (ref 8–23)
CO2: 28 mmol/L (ref 22–32)
Calcium: 8.9 mg/dL (ref 8.9–10.3)
Chloride: 100 mmol/L (ref 98–111)
Creatinine, Ser: 1.13 mg/dL — ABNORMAL HIGH (ref 0.44–1.00)
GFR calc Af Amer: 58 mL/min — ABNORMAL LOW (ref >60)
GFR calc non Af Amer: 50 mL/min — ABNORMAL LOW (ref >60)
Glucose, Bld: 92 mg/dL (ref 70–99)
Potassium: 4.5 mmol/L (ref 3.5–5.1)
Sodium: 140 mmol/L (ref 135–145)

## 2018-08-27 LAB — TYPE AND SCREEN
ABO/RH(D): O POS
Antibody Screen: NEGATIVE
Unit division: 0
Unit division: 0

## 2018-08-27 LAB — ECHOCARDIOGRAM COMPLETE
Height: 61 in
Weight: 2377.6 oz

## 2018-08-27 MED ORDER — ENSURE ENLIVE PO LIQD
237.0000 mL | Freq: Three times a day (TID) | ORAL | Status: DC
  Administered 2018-08-27 – 2018-08-30 (×9): 237 mL via ORAL

## 2018-08-27 MED ORDER — HYDROCHLOROTHIAZIDE 12.5 MG PO CAPS
12.5000 mg | ORAL_CAPSULE | Freq: Every day | ORAL | Status: DC
  Administered 2018-08-27 – 2018-08-30 (×4): 12.5 mg via ORAL
  Filled 2018-08-27 (×4): qty 1

## 2018-08-27 MED ORDER — SODIUM CHLORIDE 0.9 % IV SOLN: 510.0000 mg | Freq: Once | INTRAVENOUS | Status: DC

## 2018-08-27 MED ORDER — SODIUM CHLORIDE 0.9 % IV SOLN
510.0000 mg | Freq: Once | INTRAVENOUS | Status: AC
  Administered 2018-08-27: 20:00:00 510 mg via INTRAVENOUS
  Filled 2018-08-27: qty 17

## 2018-08-27 MED ORDER — ADULT MULTIVITAMIN W/MINERALS CH
1.0000 | ORAL_TABLET | Freq: Every day | ORAL | Status: DC
  Administered 2018-08-27 – 2018-08-30 (×4): 1 via ORAL
  Filled 2018-08-27 (×4): qty 1

## 2018-08-27 NOTE — Progress Notes (Signed)
[facility] PROGRESS NOTE  Maria Arroyo TIW:[id number] DOB: [birth year] DOA: 08/26/2018 PCP: [name], MD  Assessment/Plan: 1. Acute on chronic hypoxic respiratory failure, likely secondary to acute diastolic CHF exacerbation/worsening right-sided pleural effusion.  Likely also further worsened by dyspnea related to anemia.  Currently requiring 3 L (at home on 2 L PRN)BMP greater than 1200, peripheral edema, positive orthopnea. Continue IV Lasix twice daily, monitor output, daily weights.  TTE shows preserved EF with impaired relaxation 2. Chronic right-sided pleural effusion status post right lobectomy in January 2020.   has fluid buildup there in the past likely further worsened by acute CHF flare.  Will repeat chest x-ray on 5/31 if continues to worsen may need thoracentesis. 3. Acute on chronic anemia.  No active signs of blood loss.  Panel consistent with iron deficiency anemia, start IV Feraheme, closely monitor CBC status post 2 units hemoglobin has improved appropriately. 4. Hypertension, stable Not at goal SBP's quite elevated 170s -180s.continue bisoprolol (no room in heart rate to increase)Hypokalemia in the past related to ACE inhibitor, hopefuly diuresis will improve, add HCTZ 12.5 mg and monitor 5. Paroxysmal atrial fibrillation, rate controlled, in NSR on my exam. continue home amiodarone, holding Eliquis in setting of worsening chronic anemia, on telemetry 6. Depression, stable continue Effexor 7. Hyperlipidemia, stable continue simvastatin 8. Neuropathy, stable continue gabapentin 9. COPD, stable.  No wheezing.  Continue home inhalers.  Code Status: FULL  Family Communication: no family at bedsid (indicate person spoken with, relationship, and if by phone, the number) Disposition Plan: Continue IV diuresis, monitor blood pressure, IV iron, 2 wean oxygen   Consultants:  None  Procedures:  TTE, 5/30  Antibiotics:  None (indicate start date, and stop  date if known)  HPI/Subjective:  Maria Arroyo is a 68 y.o. 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  "I my breathing is better"  Objective: Vitals:   08/27/18 2026 08/27/18 2059  BP:  (!) 169/77  Pulse: (!) 52 (!) 46  Resp:    Temp:    SpO2:      Intake/Output Summary (Last 24 hours) at 08/27/2018 2117 Last data filed at 08/27/2018 2023 Gross per 24 hour  Intake 1720 ml  Output 1400 ml  Net 320 ml   Filed Weights   08/27/18 0415  Weight: 67.4 kg    Exam:   General: Pale female, lying in bed, no distress  Cardiovascular: Regular rate and rhythm, 1+ pitting edema bilateral lower extremities below knee  Respiratory: Normal respiratory effort on 3 L, minimal crackles at bases on left, diminished breath sounds on right  Abdomen: Soft, nondistended, nontender, normal bowel sounds  Musculoskeletal: Normal range of motion  Skin no rashes or skin lesions  Neurologic alert and oriented x4, no appreciable focal deficits  Data Reviewed: Basic Metabolic Panel: Recent Labs  Lab 08/26/18 1339 08/27/18 0401  NA 139 140  K 3.6 4.5  CL 100 100  CO2 24 28  GLUCOSE 146* 92  BUN 9 15  CREATININE 0.81 1.13*  CALCIUM 10.0 8.9   Liver Function Tests: Recent Labs  Lab 08/26/18 1339  AST  24  ALT 21  ALKPHOS 115  BILITOT 1.4*  PROT 8.5*  ALBUMIN 4.3   No results for input(s): LIPASE, AMYLASE in the last 168 hours. No results for input(s): AMMONIA in the last 168 hours. CBC: Recent Labs  Lab 08/26/18 1339 08/27/18 0401 08/27/18 1000  08/27/18 2008  WBC 7.5 8.7 10.7* 11.0*  NEUTROABS 6.0  --   --   --   HGB 6.7* 9.7* 10.0* 10.0*  HCT 23.2* 31.6* 33.0* 33.2*  MCV 78.1* 79.8* 79.9* 80.0  PLT 393 358 355 375   Cardiac Enzymes: Recent Labs  Lab 08/26/18 1339 08/27/18 0401 08/27/18 1000  TROPONINI 0.26* 0.06* 0.06*   BNP (last 3 results) Recent Labs    08/26/18 1339  BNP 1,280.5*    ProBNP (last 3 results) No results for input(s): PROBNP in the last 8760 hours.  CBG: No results for input(s): GLUCAP in the last 168 hours.  Recent Results (from the past 240 hour(s))  SARS Coronavirus 2 (CEPHEID - Performed in [facility] hospital lab), Hosp Order     Status: None   Collection Time: 08/26/18  1:19 PM  Result Value Ref Range Status   SARS Coronavirus 2 NEGATIVE NEGATIVE Final    Comment: (NOTE) If result is NEGATIVE SARS-CoV-2 target nucleic acids are NOT DETECTED. The SARS-CoV-2 RNA is generally detectable in upper and lower  respiratory specimens during the acute phase of infection. The lowest  concentration of SARS-CoV-2 viral copies this assay can detect is 250  copies / mL. A negative result does not preclude SARS-CoV-2 infection  and should not be used as the sole basis for treatment or other  patient management decisions.  A negative result may occur with  improper specimen collection / handling, submission of specimen other  than nasopharyngeal swab, presence of viral mutation(s) within the  areas targeted by this assay, and inadequate number of viral copies  (<250 copies / mL). A negative result must be combined with clinical  observations, patient history, and epidemiological information. If result is POSITIVE SARS-CoV-2 target nucleic acids are DETECTED. The SARS-CoV-2 RNA is generally detectable in upper and lower  respiratory specimens dur ing the acute phase of infection.  Positive  results are indicative of active infection with SARS-CoV-2.  Clinical  correlation with patient history  and other diagnostic information is  necessary to determine patient infection status.  Positive results do  not rule out bacterial infection or co-infection with other viruses. If result is PRESUMPTIVE POSTIVE SARS-CoV-2 nucleic acids MAY BE PRESENT.   A presumptive positive result was obtained on the submitted specimen  and confirmed on repeat testing.  While 2019 novel coronavirus  (SARS-CoV-2) nucleic acids may be present in the submitted sample  additional confirmatory testing may be necessary for epidemiological  and / or clinical management purposes  to differentiate between  SARS-CoV-2 and other Sarbecovirus currently known to infect humans.  If clinically indicated additional testing with an alternate test  methodology [phone]) is advised. The SARS-CoV-2 RNA is generally  detectable in upper and lower respiratory sp ecimens during the acute  phase of infection. The expected result is Negative. Fact Sheet for Patients:  StrictlyIdeas.no Fact Sheet for Healthcare Providers: BankingDealers.co.za This test is not yet approved or cleared by the [place] FDA and has been authorized for detection and/or diagnosis of SARS-CoV-2 by FDA under an Emergency Use Authorization (EUA).  This EUA will remain in effect (meaning this test can be used) for the duration of the  COVID-19 declaration under Section 564(b)(1) of the Act, 21 U.S.C. section 360bbb-3(b)(1), unless the authorization is terminated or revoked sooner. Performed at [facility], [place] [address] [place], [place] [address]      Studies: Dg Chest Portable 1 View  Result Date: 08/26/2018 CLINICAL DATA:  Shortness of breath. EXAM: PORTABLE CHEST 1 VIEW COMPARISON:  Radiographs of Aug 09, 2018. FINDINGS: Stable cardiomediastinal silhouette. Left lung is clear. No pneumothorax is noted. Mild right basilar atelectasis or infiltrate is noted with increased right pleural  effusion. Bony thorax is unremarkable. IMPRESSION: Mild right basilar atelectasis or infiltrate is noted with increased right pleural effusion. Electronically Signed   By: Marijo Conception M.D.   On: 08/26/2018 15:51    Scheduled Meds: . amiodarone  200 mg Oral Daily  . bisoprolol  2.5 mg Oral Daily  . busPIRone  15 mg Oral BID  . feeding supplement (ENSURE ENLIVE)  237 mL Oral TID BM  . furosemide  40 mg Intravenous Q12H  . gabapentin  600 mg Oral QID  . multivitamin with minerals  1 tablet Oral Daily  . nicotine  21 mg Transdermal Daily  . rosuvastatin  40 mg Oral Daily  . sodium chloride flush  3 mL Intravenous Q12H  . venlafaxine XR  75 mg Oral BID   Continuous Infusions: . 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

## 2018-08-27 NOTE — Progress Notes (Addendum)
Pharmacy note: IV iron  68 yo female with iron deficiency anemia. She is also noted with HF with fluid overload -iron= 9, sat= 2, ferritin= 8 -hg= 10 (6.7 on admission; noted s/p PRBC  Estimated iron deficit ~ 1250mg . In setting of HF with diuresis will not use iron dextran due to the volume needed for the dose  Plan -Feraheme 510mg  IV today then another 510 mg on 6/3 -Will follow until doses have been completed  Hildred Laser, PharmD Clinical Pharmacist **Pharmacist phone directory can now be found on [email or web address] ([email or web address]).  Listed under [place].

## 2018-08-27 NOTE — Progress Notes (Signed)
  Echocardiogram 2D Echocardiogram has been performed.  Maria Arroyo 08/27/2018, 3:02 PM

## 2018-08-27 NOTE — Progress Notes (Signed)
Initial Nutrition Assessment  RD working remotely.  DOCUMENTATION CODES:   Not applicable  INTERVENTION:   -Ensure Enlive po TID, each supplement provides 350 kcal and 20 grams of protein -MVI with minerals daily  NUTRITION DIAGNOSIS:   Inadequate oral intake related to decreased appetite as evidenced by per patient/family report, meal completion < 25%.  GOAL:   Patient will meet greater than or equal to 90% of their needs  MONITOR:   PO intake, Supplement acceptance, Labs, Weight trends, Skin, I & O's  REASON FOR ASSESSMENT:   Malnutrition Screening Tool    ASSESSMENT:   Maria Arroyo is a 68 y.o. 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  Pt admitted with symptomatic anemia.   Reviewed I/O's: +1,2 L x 24 hours  UOP: 800 ml x 24 hours  Spoke with pt on phone, who was pleasant and in good spirits today. She reports feeling better since bring admitted to the hospital. She endorses a decreased appetite over the past 6 months, which she attributes to undergoing treatments for lung cancer ("I would go to the doctor and I would continue to lose weight. When he asked me why, I told him I just wasn't hungry"). Pt estimates that she consumes only one meal per day ("but I eat good at that meal"). Meals consist of beans and potatoes or a meat, starch, and vegetable. Pt snacks between meals, but not very often ("usually on junk, like chips").   Pt estimates she has lost about 30# in the past 6 months, but has regained about 3-4# within the past month due to improved appetite. Reviewed wt hx; pt has experienced a 6% wt loss over the past 6 months, which is not significant for time frame.  Pt had not consumed a hot meal at the hospital, however, did not like the frozen dinner she was provided earlier ("I just  couldn't eat it"). Discussed importance of good meal and supplement intake to promote healing. Pt amenable to try nutritional supplements.   Labs reviewed.   NUTRITION - FOCUSED PHYSICAL EXAM:    Most Recent Value  Orbital Region  Unable to assess  Upper Arm Region  Unable to assess  Thoracic and Lumbar Region  Unable to assess  Buccal Region  Unable to assess  Temple Region  Unable to assess  Clavicle Bone Region  Unable to assess  Clavicle and Acromion Bone Region  Unable to assess  Scapular Bone Region  Unable to assess  Dorsal Hand  Unable to assess  Patellar Region  Unable to assess  Anterior Thigh Region  Unable to assess  Posterior Calf Region  Unable to assess  Edema (RD Assessment)  Unable to assess  Hair  Unable to assess  Eyes  Unable to assess  Mouth  Unable to assess  Skin  Unable to assess  Nails  Unable to assess       Diet Order:   Diet Order            Diet renal with fluid restriction Fluid restriction: 1500 mL Fluid; Room service appropriate? Yes; Fluid consistency: Thin  Diet effective now              EDUCATION NEEDS:   Education needs have been addressed  Skin:  Skin Assessment: Reviewed RN Assessment  Last BM:  08/25/18  Height:  Ht Readings from Last 1 Encounters:  08/27/18 5\' 1"  (1.549 m)    Weight:   Wt Readings from Last 1 Encounters:  08/27/18 67.4 kg    Ideal Body Weight:  47.7 kg  BMI:  Body mass index is 28.08 kg/m.  Estimated Nutritional Needs:   Kcal:  1700-1900  Protein:  85-100 grams  Fluid:  1.7-1.9 L    Zair Borawski A. Jimmye Norman, RD, LDN, [place] Registered Dietitian II Certified Diabetes Care and Education Specialist Pager: [phone] After hours Pager: [phone]

## 2018-08-27 NOTE — Progress Notes (Signed)
Pt states she has fallen in the last six months  High fall risk education went over with pt at bedside  Pt understanding to call staff prior to getting out of bed  Bed alarm set, bed in lowest position, all belongings in reach  Will continue to monitor

## 2018-08-27 NOTE — Progress Notes (Signed)
Pt MD regarding pt NPO diet orders MD placed order for renal diet  Pt aware

## 2018-08-28 ENCOUNTER — Inpatient Hospital Stay (HOSPITAL_COMMUNITY): Payer: Medicare Other

## 2018-08-28 DIAGNOSIS — I2510 Atherosclerotic heart disease of native coronary artery without angina pectoris: Secondary | ICD-10-CM

## 2018-08-28 DIAGNOSIS — Z902 Acquired absence of lung [part of]: Secondary | ICD-10-CM

## 2018-08-28 LAB — CBC
HCT: 32.4 % — ABNORMAL LOW (ref 36.0–46.0)
Hemoglobin: 9.9 g/dL — ABNORMAL LOW (ref 12.0–15.0)
MCH: 24.4 pg — ABNORMAL LOW (ref 26.0–34.0)
MCHC: 30.6 g/dL (ref 30.0–36.0)
MCV: 80 fL (ref 80.0–100.0)
Platelets: 347 10*3/uL (ref 150–400)
RBC: 4.05 MIL/uL (ref 3.87–5.11)
RDW: 17.4 % — ABNORMAL HIGH (ref 11.5–15.5)
WBC: 10.7 10*3/uL — ABNORMAL HIGH (ref 4.0–10.5)
nRBC: 0.3 % — ABNORMAL HIGH (ref 0.0–0.2)

## 2018-08-28 LAB — BASIC METABOLIC PANEL
Anion gap: 9 (ref 5–15)
BUN: 17 mg/dL (ref 8–23)
CO2: 32 mmol/L (ref 22–32)
Calcium: 9.3 mg/dL (ref 8.9–10.3)
Chloride: 97 mmol/L — ABNORMAL LOW (ref 98–111)
Creatinine, Ser: 1.25 mg/dL — ABNORMAL HIGH (ref 0.44–1.00)
GFR calc Af Amer: 52 mL/min — ABNORMAL LOW (ref >60)
GFR calc non Af Amer: 44 mL/min — ABNORMAL LOW (ref >60)
Glucose, Bld: 123 mg/dL — ABNORMAL HIGH (ref 70–99)
Potassium: 4.1 mmol/L (ref 3.5–5.1)
Sodium: 138 mmol/L (ref 135–145)

## 2018-08-28 LAB — HIV ANTIBODY (ROUTINE TESTING W REFLEX): HIV Screen 4th Generation wRfx: NONREACTIVE

## 2018-08-28 NOTE — Evaluation (Signed)
Physical Therapy Evaluation Patient Details Name: Maria Arroyo MRN: [id number] DOB: 1950-08-16 Today's Date: 08/28/2018   History of Present Illness  68 y.o. 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   Clinical Impression  PTA pt living with husband in single story home with 5 steps to enter. Pt was independent in ambulation community distances and with iADLs until the last 3 weeks when she first required cane then RW for ambulation of household distances. Pt also is requiring assist from husband with iADLs. Pt currently limited in safe ambulation by O2 desaturation (see General Comments) as well as foot drop, and decreased strength and endurance. Pt also has decreased awareness of her deficits. Pt requires min guard for transfers and minA for ambulation without AD. Pt encouraged to utilize RW at home until she becomes stronger. PT recommending HHPT at d/c to improve strength and balance in her home environment. PT will continue to follow acutely.    Follow Up Recommendations Home health PT;Supervision/Assistance - 24 hour    Equipment Recommendations  None recommended by PT       Precautions / Restrictions Precautions Precautions: Fall Restrictions Weight Bearing Restrictions: No      Mobility  Bed Mobility               General bed mobility comments: sitting EoB on entry   Transfers Overall transfer level: Independent Equipment used: None             General transfer comment: good power up and steadying   Ambulation/Gait Ambulation/Gait  assistance: Min assist;Min guard Gait Distance (Feet): 300 Feet Assistive device: None Gait Pattern/deviations: Decreased dorsiflexion - left;Step-through pattern Gait velocity: slowed Gait velocity interpretation: 1.31 - 2.62 ft/sec, indicative of limited community ambulator General Gait Details: pt reports she does not think she needs AD, able to walk 3 feet before having loss of balance requiring minA for steadying, continued ambulation without AD to illustrate need for RW, pt requires increased knee and hip flexion on L to clear L foot in swing through, 3x LoB requiring min A to steady, pt agrees with use of RW until she get stronger      Balance Overall balance assessment: Needs assistance Sitting-balance support: Feet supported;No upper extremity supported Sitting balance-Leahy Scale: Good     Standing balance support: No upper extremity supported;During functional activity Standing balance-Leahy Scale: Fair                               Pertinent Vitals/Pain Pain Assessment: No/denies pain    Home Living Family/patient expects to be discharged to:: Private residence Living Arrangements: Spouse/significant other Available Help at Discharge: Family;Available 24 hours/day Type of Home: House Home Access: Stairs to enter   [employer] of Steps: 5 Home Layout: One level Home Equipment: Walker - 2 wheels;Cane - single point;Shower seat      Prior Function Level of Independence: Needs assistance   Gait / Transfers Assistance Needed: over last 3 weeks has progressed from no AD and community ambulation to cane to RW for household mobility  ADL's /  Homemaking Assistance Needed: independent with ADLs, decreasing ability over last 3 weeks to perform iADL           Extremity/Trunk Assessment   Upper Extremity Assessment Upper Extremity Assessment: Generalized weakness    Lower Extremity Assessment Lower Extremity Assessment: LLE  deficits/detail LLE Deficits / Details: L foot drop, going to see neurologist in July, otherwise strength grossly 4/5  LLE Coordination: decreased gross motor       Communication   Communication: No difficulties  Cognition Arousal/Alertness: Awake/alert Behavior During Therapy: WFL for tasks assessed/performed Overall Cognitive Status: Within Functional Limits for tasks assessed                                        General Comments General comments (skin integrity, edema, etc.): Pt on 2L O2 via [place] on entry with SaO2 98%O2, with ambulation on 2L SaO2 dropped to 78%O2, increased to 3 L O2 and instructed in pursed lipped breathing SaO2 rebounded to 92%O2, left on 3L O2 when back in room     Assessment/Plan    PT Assessment Patient needs continued PT services  PT Problem List Decreased strength;Decreased range of motion;Decreased activity tolerance;Decreased balance;Decreased mobility;Decreased safety awareness;Cardiopulmonary status limiting activity;Decreased knowledge of use of DME;Decreased cognition       PT Treatment Interventions DME instruction;Gait training;Stair training;Functional mobility training;Therapeutic activities;Therapeutic exercise;Balance training;Cognitive remediation;Patient/family education    PT Goals (Current goals can be found in the Care Plan section)  Acute Rehab PT Goals Patient Stated Goal: go home to husband PT Goal Formulation: With patient Time For Goal Achievement: 09/11/18 Potential to Achieve Goals: Fair    Frequency Min 3X/week    AM-PAC PT "6 Clicks" Mobility  Outcome Measure Help needed turning from your back to your side while in a flat bed without using bedrails?: None Help needed moving from lying on your back to sitting on the side of a flat bed without using bedrails?: None Help needed moving to and from a bed to a chair (including a wheelchair)?: None Help needed standing up from a chair using your arms (e.g.,  wheelchair or bedside chair)?: None Help needed to walk in hospital room?: A Little Help needed climbing 3-5 steps with a railing? : A Little 6 Click Score: 22    End of Session Equipment Utilized During Treatment: Gait belt;Oxygen Activity Tolerance: Patient tolerated treatment well Patient left: in bed;with call bell/phone within reach Nurse Communication: Mobility status PT Visit Diagnosis: Unsteadiness on feet (R26.81);Other abnormalities of gait and mobility (R26.89);Repeated falls (R29.6);Muscle weakness (generalized) (M62.81);History of falling (Z91.81);Difficulty in walking, not elsewhere classified (R26.2)    Time: 1230-1250 PT Time Calculation (min) (ACUTE ONLY): 20 min   Charges:   PT Evaluation $PT Eval Moderate Complexity: 1 Mod          Luverta Korte B. Migdalia Dk PT, DPT Acute Rehabilitation Services Pager [phone] Office [phone]   [place] 08/28/2018, 1:14 PM

## 2018-08-28 NOTE — Progress Notes (Addendum)
Pt refuses to have bed alarm on. Education was given with teach back. Pt verbalizes understanding. Call bell within pt's reach.

## 2018-08-28 NOTE — Progress Notes (Signed)
[facility] PROGRESS NOTE  Maria Arroyo KWI:[id number] DOB: [birth year] DOA: 08/26/2018 PCP: [name], MD  Assessment/Plan: 1. Acute on chronic hypoxic respiratory failure, likely secondary to acute diastolic CHF exacerbation/worsening right-sided pleural effusion.  Likely also further worsened by dyspnea related to anemia.  Requiring less O2 on 2 L, however still has persistent right-sided pleural effusion we will continue IV Lasix and repeat chest x-ray if persists may need thoracentesis.  BNP greater than 1200, peripheral edema, positive orthopnea. Continue IV Lasix twice daily, monitor output, daily weights.  TTE shows preserved EF with impaired relaxation 2. Chronic right-sided pleural effusion status post right lobectomy in January 2020.   has fluid buildup there in the past likely further worsened by acute CHF flare.  See plan above  3. Acute on chronic anemia.  No active signs of blood loss.  Panel consistent with iron deficiency anemia and B12 deficiency,  IV Feraheme, closely monitor CBC, status post 2 units hemoglobin has improved appropriately. 4. Hypertension, improved on home bisoprolol and added HCTZ 12.5 mg, closely monitor.  5. Paroxysmal atrial fibrillation, rate controlled, in NSR on my exam. continue home amiodarone, holding Eliquis in setting of worsening chronic anemia, will need to resume once hemoglobin remained stable, on telemetry 6. Depression, stable continue Effexor 7. Hyperlipidemia, stable continue simvastatin 8. Neuropathy, stable continue gabapentin 9. COPD, stable.  No wheezing.  Continue home inhalers.  Code Status: FULL  Family Communication: no family at bedside (indicate person spoken with, relationship, and if by phone, the number) Disposition Plan: Continue IV diuresis, monitor blood pressure, IV iron,  wean oxygen, repeat chest x-ray, may need thoracentesis   Consultants:  None  Procedures:  TTE, 5/30  Antibiotics:  None  (indicate start date, and stop date if known)  HPI/Subjective:  Maria Arroyo is a 68 y.o. 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  "I feel much better today" Denies any chest pain Reports improvement in dyspnea  Objective: Vitals:   08/28/18 0936 08/28/18 1149  BP: (!) 144/85 (!) 119/54  Pulse: (!) 48 (!) 48  Resp:  20  Temp:  98.1 F (36.7 C)  SpO2: 96% 95%    Intake/Output Summary (Last 24 hours) at 08/28/2018 1830 Last data filed at 08/28/2018 1700 Gross per 24 hour  Intake 1080 ml  Output 2675 ml  Net -1595 ml   Filed Weights   08/27/18 0415 08/28/18 0521  Weight: 67.4 kg 67.9 kg    Exam:   General: Pale female, lying in bed, no distress  Cardiovascular: Regular rate and rhythm, systolic ejection murmur heard, 1+ pitting edema bilateral lower extremities below knee  Respiratory: Normal respiratory effort on 2 L, minimal crackles at bases on left, diminished breath sounds on right  Abdomen: Soft, nondistended, nontender, normal bowel sounds  Musculoskeletal: Normal range of motion  Skin no rashes or skin lesions  Neurologic alert and oriented x4, no appreciable focal deficits  Data Reviewed: Basic Metabolic Panel: Recent Labs  Lab 08/26/18 1339 08/27/18 0401 08/28/18 0518  NA 139 140 138  K 3.6 4.5 4.1  CL 100 100 97*  CO2 24  28 32  GLUCOSE 146* 92 123*  BUN 9 15 17   CREATININE 0.81 1.13* 1.25*  CALCIUM 10.0 8.9 9.3   Liver Function Tests: Recent Labs  Lab 08/26/18 1339  AST 24  ALT 21  ALKPHOS 115  BILITOT 1.4*   PROT 8.5*  ALBUMIN 4.3   No results for input(s): LIPASE, AMYLASE in the last 168 hours. No results for input(s): AMMONIA in the last 168 hours. CBC: Recent Labs  Lab 08/26/18 1339 08/27/18 0401 08/27/18 1000 08/27/18 2008 08/28/18 0518  WBC 7.5 8.7 10.7* 11.0* 10.7*  NEUTROABS 6.0  --   --   --   --   HGB 6.7* 9.7* 10.0* 10.0* 9.9*  HCT 23.2* 31.6* 33.0* 33.2* 32.4*  MCV 78.1* 79.8* 79.9* 80.0 80.0  PLT 393 358 355 375 347   Cardiac Enzymes: Recent Labs  Lab 08/26/18 1339 08/27/18 0401 08/27/18 1000  TROPONINI 0.26* 0.06* 0.06*   BNP (last 3 results) Recent Labs    08/26/18 1339  BNP 1,280.5*    ProBNP (last 3 results) No results for input(s): PROBNP in the last 8760 hours.  CBG: No results for input(s): GLUCAP in the last 168 hours.  Recent Results (from the past 240 hour(s))  SARS Coronavirus 2 (CEPHEID - Performed in [facility] hospital lab), Hosp Order     Status: None   Collection Time: 08/26/18  1:19 PM  Result Value Ref Range Status   SARS Coronavirus 2 NEGATIVE NEGATIVE Final    Comment: (NOTE) If result is NEGATIVE SARS-CoV-2 target nucleic acids are NOT DETECTED. The SARS-CoV-2 RNA is generally detectable in upper and lower  respiratory specimens during the acute phase of infection. The lowest  concentration of SARS-CoV-2 viral copies this assay can detect is 250  copies / mL. A negative result does not preclude SARS-CoV-2 infection  and should not be used as the sole basis for treatment or other  patient management decisions.  A negative result may occur with  improper specimen collection / handling, submission of specimen other  than nasopharyngeal swab, presence of viral mutation(s) within the  areas targeted by this assay, and inadequate number of viral copies  (<250 copies / mL). A negative result must be combined with clinical  observations, patient history, and epidemiological information. If result is POSITIVE SARS-CoV-2 target  nucleic acids are DETECTED. The SARS-CoV-2 RNA is generally detectable in upper and lower  respiratory specimens dur ing the acute phase of infection.  Positive  results are indicative of active infection with SARS-CoV-2.  Clinical  correlation with patient history and other diagnostic information is  necessary to determine patient infection status.  Positive results do  not rule out bacterial infection or co-infection with other viruses. If result is PRESUMPTIVE POSTIVE SARS-CoV-2 nucleic acids MAY BE PRESENT.   A presumptive positive result was obtained on the submitted specimen  and confirmed on repeat testing.  While 2019 novel coronavirus  (SARS-CoV-2) nucleic acids may be present in the submitted sample  additional confirmatory testing may be necessary for epidemiological  and / or clinical management purposes  to differentiate between  SARS-CoV-2 and other Sarbecovirus currently known to infect humans.  If clinically indicated additional testing with an alternate test  methodology [phone]) is advised. The SARS-CoV-2 RNA is generally  detectable in upper and lower respiratory sp ecimens during the acute  phase of infection. The expected result is Negative. Fact Sheet for Patients:  StrictlyIdeas.no Fact Sheet for Healthcare Providers: BankingDealers.co.za This test is not  yet approved or cleared by the [place] and has been authorized for detection and/or diagnosis of SARS-CoV-2 by FDA under an Emergency Use Authorization (EUA).  This EUA will remain in effect (meaning this test can be used) for the duration of the COVID-19 declaration under Section 564(b)(1) of the Act, 21 U.S.C. section 360bbb-3(b)(1), unless the authorization is terminated or revoked sooner. Performed at [facility], [place] [address], [place], [place] [address]      Studies: Dg Chest Port 1 View  Result Date: 08/28/2018 CLINICAL DATA:   Dyspnea, pleural effusion EXAM: PORTABLE CHEST 1 VIEW COMPARISON:  08/26/2018 chest radiograph. FINDINGS: Surgical hardware from ACDF partially visualized overlying the lower cervical spine. Stable cardiomediastinal silhouette with normal heart size. No pneumothorax. Small right pleural effusion, similar. No left pleural effusion. No overt pulmonary edema. Stable patchy right lung base opacity. IMPRESSION: Similar small right pleural effusion and patchy right lung base opacity. Electronically Signed   By: Ilona Sorrel M.D.   On: 08/28/2018 08:01    Scheduled Meds: . amiodarone  200 mg Oral Daily  . bisoprolol  2.5 mg Oral Daily  . busPIRone  15 mg Oral BID  . feeding supplement (ENSURE ENLIVE)  237 mL Oral TID BM  . furosemide  40 mg Intravenous Q12H  . gabapentin  600 mg Oral QID  . hydrochlorothiazide  12.5 mg Oral Daily  . multivitamin with minerals  1 tablet Oral Daily  . nicotine  21 mg Transdermal Daily  . rosuvastatin  40 mg Oral Daily  . sodium chloride flush  3 mL Intravenous Q12H  . venlafaxine XR  75 mg Oral BID   Continuous Infusions: . 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

## 2018-08-29 ENCOUNTER — Inpatient Hospital Stay (HOSPITAL_COMMUNITY): Payer: Medicare Other

## 2018-08-29 DIAGNOSIS — R0609 Other forms of dyspnea: Secondary | ICD-10-CM

## 2018-08-29 DIAGNOSIS — E785 Hyperlipidemia, unspecified: Secondary | ICD-10-CM

## 2018-08-29 LAB — BASIC METABOLIC PANEL
Anion gap: 11 (ref 5–15)
BUN: 32 mg/dL — ABNORMAL HIGH (ref 8–23)
CO2: 34 mmol/L — ABNORMAL HIGH (ref 22–32)
Calcium: 9.2 mg/dL (ref 8.9–10.3)
Chloride: 92 mmol/L — ABNORMAL LOW (ref 98–111)
Creatinine, Ser: 1.53 mg/dL — ABNORMAL HIGH (ref 0.44–1.00)
GFR calc Af Amer: 40 mL/min — ABNORMAL LOW (ref >60)
GFR calc non Af Amer: 35 mL/min — ABNORMAL LOW (ref >60)
Glucose, Bld: 105 mg/dL — ABNORMAL HIGH (ref 70–99)
Potassium: 3.8 mmol/L (ref 3.5–5.1)
Sodium: 137 mmol/L (ref 135–145)

## 2018-08-29 LAB — CBC
HCT: 34.5 % — ABNORMAL LOW (ref 36.0–46.0)
Hemoglobin: 10.3 g/dL — ABNORMAL LOW (ref 12.0–15.0)
MCH: 24.2 pg — ABNORMAL LOW (ref 26.0–34.0)
MCHC: 29.9 g/dL — ABNORMAL LOW (ref 30.0–36.0)
MCV: 81.2 fL (ref 80.0–100.0)
Platelets: 360 10*3/uL (ref 150–400)
RBC: 4.25 MIL/uL (ref 3.87–5.11)
RDW: 18.7 % — ABNORMAL HIGH (ref 11.5–15.5)
WBC: 10.3 10*3/uL (ref 4.0–10.5)
nRBC: 0.5 % — ABNORMAL HIGH (ref 0.0–0.2)

## 2018-08-29 MED ORDER — APIXABAN 5 MG PO TABS
5.0000 mg | ORAL_TABLET | Freq: Two times a day (BID) | ORAL | Status: DC
  Administered 2018-08-29 – 2018-08-30 (×2): 5 mg via ORAL
  Filled 2018-08-29 (×2): qty 1

## 2018-08-29 NOTE — Progress Notes (Signed)
Physical Therapy Treatment Patient Details Name: Maria Arroyo MRN: [id number] DOB: 01/10/1951 Today's Date: 08/29/2018    History of Present Illness 68 y.o. 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     PT Comments    Pt reports she is feeling much better today. Pt is making good progress towards her goals today. Pt is mod I for bed mobility, and transfers, min guard for ambulation of 450 feet with RW and min A for ascent/descent of 5 steps with rail on L. D/c plans remain appropriate at this time.     Follow Up Recommendations  Home health PT;Supervision/Assistance - 24 hour     Equipment Recommendations  None recommended by PT       Precautions / Restrictions Precautions Precautions: Fall Restrictions Weight Bearing Restrictions: No    Mobility  Bed Mobility Overal bed mobility: Modified Independent             General bed mobility comments: use of bedrails to come to EoB  Transfers Overall transfer level: Modified independent Equipment used: None             General transfer comment: good power up and steadying   Ambulation/Gait Ambulation/Gait assistance: Min guard Gait Distance (Feet): 450 Feet Assistive device: None Gait Pattern/deviations: Decreased dorsiflexion - left;Step-through pattern Gait velocity: slowed Gait velocity interpretation: 1.31 - 2.62 ft/sec, indicative of limited community ambulator General Gait Details: min guard for safety, improved L foot clearance with gait today    Stairs Stairs: Yes Stairs  assistance: Min assist Stair Management: One rail Left;Step to pattern;Forwards Number of Stairs: 5 General stair comments: minA for steadying,        Balance Overall balance assessment: Needs assistance Sitting-balance support: Feet supported;No upper extremity supported Sitting balance-Leahy Scale: Good     Standing balance support: No upper extremity supported;During functional activity Standing balance-Leahy Scale: Fair                              Cognition Arousal/Alertness: Awake/alert Behavior During Therapy: WFL for tasks assessed/performed Overall Cognitive Status: Within Functional Limits for tasks assessed                                           General Comments General comments (skin integrity, edema, etc.): Pt able to maintain SaO2 >90%O2 with ambulation on 3 L O2 via [place].       Pertinent Vitals/Pain Pain Assessment: No/denies pain           PT Goals (current goals can now be found in the care plan section) Acute Rehab PT Goals Patient Stated Goal: go home to husband PT Goal Formulation: With patient Time For Goal Achievement: 09/11/18 Potential to Achieve Goals: Fair    Frequency    Min 3X/week      PT Plan Current plan remains appropriate       AM-PAC PT "6 Clicks" Mobility   Outcome Measure  Help needed turning from your back  to your side while in a flat bed without using bedrails?: None Help needed moving from lying on your back to sitting on the side of a flat bed without using bedrails?: None Help needed moving to and from a bed to a chair (including a wheelchair)?: None Help needed standing up from a chair using your arms (e.g., wheelchair or bedside chair)?: None Help needed to walk in hospital room?: A Little Help needed climbing 3-5 steps with a railing? : A Little 6 Click Score: 22    End of Session Equipment Utilized During Treatment: Gait belt;Oxygen Activity Tolerance: Patient tolerated  treatment well Patient left: in bed;with call bell/phone within reach Nurse Communication: Mobility status PT Visit Diagnosis: Unsteadiness on feet (R26.81);Other abnormalities of gait and mobility (R26.89);Repeated falls (R29.6);Muscle weakness (generalized) (M62.81);History of falling (Z91.81);Difficulty in walking, not elsewhere classified (R26.2)     Time: 7322-5672 PT Time Calculation (min) (ACUTE ONLY): 20 min  Charges:  $Gait Training: 8-22 mins                     Delia Slatten B. Migdalia Dk PT, DPT Acute Rehabilitation Services Pager [phone] Office [phone]    [place] 08/29/2018, 3:12 PM

## 2018-08-29 NOTE — Progress Notes (Signed)
[facility] PROGRESS NOTE  Maria Arroyo HER:[id number] DOB: March 04, 1951 DOA: 08/26/2018 PCP: [name], MD  Assessment/Plan: 1. Acute on chronic hypoxic respiratory failure. From acute diastolic CHF flare with R pleural effusion and symptomatic anemia, -2.3 L in 24 hours with slight cr bump.  2. Acute on chronic CHF exacerbation.-3.3 L negative, slight creatinine bump so willl stop further diuresis, CXR shows minimal effusion,   3. Chronic right-sided pleural effusion status post right lobectomy in January 2020.   Repeat CXR shows minimal size 4. Acute on chronic anemia.  Consistent with iron deficiency anemia and B12 deficiency, hgb stable since 2 U PRBC, B12, and IV Feraheme, closely monitor CBC while restarting eliquis since no signs of bleeding 5. Hypertension, better control with HCTZ 12.5 mg added to home bisoprolol  6. Paroxysmal atrial fibrillation, rate controlled, in NSR. Home amiodarone, monitor resumption of hom eliquis 7. Depression, stable continue Effexor 8. Hyperlipidemia, stable continue simvastatin 9. Neuropathy, stable continue gabapentin 10. COPD, stable.  No wheezing.  Continue home inhalers.  Code Status: FULL  Family Communication: no family at bedside (indicate person spoken with, relationship, and if by phone, the number) Disposition Plan: hold diuresis, monitor hgb while resuming eliquis,  wean oxygen, ambulatory o2 test,    Consultants:  None  Procedures:  TTE, 5/30  Antibiotics:  None (indicate start date, and stop date if known)  HPI/Subjective:  Maria Arroyo is a 68 y.o. 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  Continues to feel improved No melena or BRBPR No chest pain  Breathing fine  Objective: Vitals:   08/29/18 1912 08/29/18 2000  BP: 136/65 (!) 149/82  Pulse: (!) 48 (!) 48  Resp: 19 18  Temp: 98.8 F (37.1 C) 97.7 F (36.5 C)  SpO2: 99% 100%    Intake/Output Summary (Last 24 hours) at 08/29/2018 2321 Last data filed at 08/29/2018 2000 Gross per 24 hour  Intake 960 ml  Output 3050 ml  Net -2090 ml   Filed Weights   08/27/18 0415 08/28/18 0521 08/29/18 0557  Weight: 67.4 kg 67.9 kg 66.3 kg    Exam:   General: Pale female, lying in bed, no distress  Cardiovascular: Regular rate and rhythm, systolic ejection murmur heard, no edema  Respiratory: Normal respiratory effort on 2 L, minimal crackles at bases on left, diminished breath sounds on right base  Abdomen: Soft, nondistended, nontender, normal bowel sounds  Musculoskeletal: Normal range of motion  Skin no rashes or skin lesions  Neurologic alert and oriented x4, no appreciable focal deficits  Data Reviewed: Basic Metabolic Panel: Recent Labs  Lab 08/26/18 1339 08/27/18 0401 08/28/18 0518 08/29/18 0639  NA 139 140 138 137  K 3.6 4.5 4.1 3.8  CL 100 100 97* 92*  CO2 24 28 32 34*  GLUCOSE 146* 92 123* 105*  BUN 9 15 17  32*  CREATININE 0.81 1.13* 1.25* 1.53*  CALCIUM 10.0 8.9 9.3 9.2   Liver Function Tests: Recent Labs  Lab 08/26/18 1339  AST 24  ALT 21  ALKPHOS 115  BILITOT 1.4*  PROT 8.5*  ALBUMIN 4.3   No  results for input(s): LIPASE, AMYLASE in the last 168 hours. No results for input(s): AMMONIA in the last 168 hours. CBC: Recent Labs  Lab 08/26/18 1339 08/27/18 0401 08/27/18 1000 08/27/18 2008 08/28/18 0518 08/29/18 1046  WBC 7.5 8.7 10.7* 11.0* 10.7* 10.3  NEUTROABS 6.0  --   --   --   --   --   HGB 6.7* 9.7* 10.0* 10.0* 9.9* 10.3*  HCT 23.2* 31.6* 33.0* 33.2* 32.4* 34.5*   MCV 78.1* 79.8* 79.9* 80.0 80.0 81.2  PLT 393 358 355 375 347 360   Cardiac Enzymes: Recent Labs  Lab 08/26/18 1339 08/27/18 0401 08/27/18 1000  TROPONINI 0.26* 0.06* 0.06*   BNP (last 3 results) Recent Labs    08/26/18 1339  BNP 1,280.5*    ProBNP (last 3 results) No results for input(s): PROBNP in the last 8760 hours.  CBG: No results for input(s): GLUCAP in the last 168 hours.  Recent Results (from the past 240 hour(s))  SARS Coronavirus 2 (CEPHEID - Performed in [place] hospital lab), Hosp Order     Status: None   Collection Time: 08/26/18  1:19 PM  Result Value Ref Range Status   SARS Coronavirus 2 NEGATIVE NEGATIVE Final    Comment: (NOTE) If result is NEGATIVE SARS-CoV-2 target nucleic acids are NOT DETECTED. The SARS-CoV-2 RNA is generally detectable in upper and lower  respiratory specimens during the acute phase of infection. The lowest  concentration of SARS-CoV-2 viral copies this assay can detect is 250  copies / mL. A negative result does not preclude SARS-CoV-2 infection  and should not be used as the sole basis for treatment or other  patient management decisions.  A negative result may occur with  improper specimen collection / handling, submission of specimen other  than nasopharyngeal swab, presence of viral mutation(s) within the  areas targeted by this assay, and inadequate number of viral copies  (<250 copies / mL). A negative result must be combined with clinical  observations, patient history, and epidemiological information. If result is POSITIVE SARS-CoV-2 target nucleic acids are DETECTED. The SARS-CoV-2 RNA is generally detectable in upper and lower  respiratory specimens dur ing the acute phase of infection.  Positive  results are indicative of active infection with SARS-CoV-2.  Clinical  correlation with patient history and other diagnostic information is  necessary to determine patient infection status.  Positive results do  not  rule out bacterial infection or co-infection with other viruses. If result is PRESUMPTIVE POSTIVE SARS-CoV-2 nucleic acids MAY BE PRESENT.   A presumptive positive result was obtained on the submitted specimen  and confirmed on repeat testing.  While 2019 novel coronavirus  (SARS-CoV-2) nucleic acids may be present in the submitted sample  additional confirmatory testing may be necessary for epidemiological  and / or clinical management purposes  to differentiate between  SARS-CoV-2 and other Sarbecovirus currently known to infect humans.  If clinically indicated additional testing with an alternate test  methodology [phone]) is advised. The SARS-CoV-2 RNA is generally  detectable in upper and lower respiratory sp ecimens during the acute  phase of infection. The expected result is Negative. Fact Sheet for Patients:  StrictlyIdeas.no Fact Sheet for Healthcare Providers: BankingDealers.co.za This test is not yet approved or cleared by the [place] FDA and has been authorized for detection and/or diagnosis of SARS-CoV-2 by FDA under an Emergency Use Authorization (EUA).  This EUA will remain in effect (meaning this test can be used) for the duration of  the COVID-19 declaration under Section 564(b)(1) of the Act, 21 U.S.C. section 360bbb-3(b)(1), unless the authorization is terminated or revoked sooner. Performed at [facility], [place] [address] [place], [place] [address]      Studies: Dg Chest Port 1 View  Result Date: 08/29/2018 CLINICAL DATA:  Pleural effusion EXAM: PORTABLE CHEST 1 VIEW COMPARISON:  Chest radiograph from one day prior. FINDINGS: Stable cardiomediastinal silhouette with normal heart size. No pneumothorax. Small right pleural effusion, slightly decreased. No left pleural effusion. No overt pulmonary edema. Patchy right lung base opacity appears slightly decreased. IMPRESSION: 1. Small right pleural effusion,  slightly decreased. 2. Patchy right lung base opacity, slightly decreased. Electronically Signed   By: Ilona Sorrel M.D.   On: 08/29/2018 07:53   Dg Chest Port 1 View  Result Date: 08/28/2018 CLINICAL DATA:  Dyspnea, pleural effusion EXAM: PORTABLE CHEST 1 VIEW COMPARISON:  08/26/2018 chest radiograph. FINDINGS: Surgical hardware from ACDF partially visualized overlying the lower cervical spine. Stable cardiomediastinal silhouette with normal heart size. No pneumothorax. Small right pleural effusion, similar. No left pleural effusion. No overt pulmonary edema. Stable patchy right lung base opacity. IMPRESSION: Similar small right pleural effusion and patchy right lung base opacity. Electronically Signed   By: Ilona Sorrel M.D.   On: 08/28/2018 08:01    Scheduled Meds: . amiodarone  200 mg Oral Daily  . apixaban  5 mg Oral BID  . bisoprolol  2.5 mg Oral Daily  . busPIRone  15 mg Oral BID  . feeding supplement (ENSURE ENLIVE)  237 mL Oral TID BM  . gabapentin  600 mg Oral QID  . hydrochlorothiazide  12.5 mg Oral Daily  . multivitamin with minerals  1 tablet Oral Daily  . nicotine  21 mg Transdermal Daily  . rosuvastatin  40 mg Oral Daily  . sodium chloride flush  3 mL Intravenous Q12H  . venlafaxine XR  75 mg Oral BID   Continuous Infusions: . 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

## 2018-08-30 DIAGNOSIS — C3491 Malignant neoplasm of unspecified part of right bronchus or lung: Secondary | ICD-10-CM

## 2018-08-30 DIAGNOSIS — I5033 Acute on chronic diastolic (congestive) heart failure: Secondary | ICD-10-CM

## 2018-08-30 LAB — BASIC METABOLIC PANEL
Anion gap: 11 (ref 5–15)
BUN: 40 mg/dL — ABNORMAL HIGH (ref 8–23)
CO2: 37 mmol/L — ABNORMAL HIGH (ref 22–32)
Calcium: 9.1 mg/dL (ref 8.9–10.3)
Chloride: 92 mmol/L — ABNORMAL LOW (ref 98–111)
Creatinine, Ser: 1.37 mg/dL — ABNORMAL HIGH (ref 0.44–1.00)
GFR calc Af Amer: 46 mL/min — ABNORMAL LOW (ref >60)
GFR calc non Af Amer: 40 mL/min — ABNORMAL LOW (ref >60)
Glucose, Bld: 97 mg/dL (ref 70–99)
Potassium: 4.2 mmol/L (ref 3.5–5.1)
Sodium: 140 mmol/L (ref 135–145)

## 2018-08-30 LAB — CBC
HCT: 33 % — ABNORMAL LOW (ref 36.0–46.0)
Hemoglobin: 9.8 g/dL — ABNORMAL LOW (ref 12.0–15.0)
MCH: 24.1 pg — ABNORMAL LOW (ref 26.0–34.0)
MCHC: 29.7 g/dL — ABNORMAL LOW (ref 30.0–36.0)
MCV: 81.3 fL (ref 80.0–100.0)
Platelets: 340 10*3/uL (ref 150–400)
RBC: 4.06 MIL/uL (ref 3.87–5.11)
RDW: 19.8 % — ABNORMAL HIGH (ref 11.5–15.5)
WBC: 11.1 10*3/uL — ABNORMAL HIGH (ref 4.0–10.5)
nRBC: 0.2 % (ref 0.0–0.2)

## 2018-08-30 MED ORDER — ADULT MULTIVITAMIN W/MINERALS CH: 1.0000 | ORAL_TABLET | Freq: Every day | ORAL | 0 refills | Status: DC

## 2018-08-30 MED ORDER — NICOTINE 21 MG/24HR TD PT24: 21.0000 mg | MEDICATED_PATCH | Freq: Every day | TRANSDERMAL | 0 refills | Status: DC

## 2018-08-30 MED ORDER — HYDROCHLOROTHIAZIDE 12.5 MG PO CAPS: 12.5000 mg | ORAL_CAPSULE | Freq: Every day | ORAL | 0 refills | Status: DC

## 2018-08-30 NOTE — TOC Transition Note (Signed)
Transition of Care [facility]) - CM/SW Discharge Note   Patient Details  Name: Maria Arroyo MRN: [id number] Date of Birth: 1950-12-30  Transition of Care [facility]) CM/SW Contact:  [place], [place] Phone Number: [phone] 08/30/2018, 12:41 PM   Clinical Narrative:    Patient discharging home today; patient stated that she is active with [facility] [place] as prior to admission. Shower chair ordered and to be delivered to the room today prior to dc home.   Final next level of care: [place] Barriers to Discharge: No Barriers Identified   Patient Goals and CMS Choice Patient states their goals for this hospitalization and ongoing recovery are:: to get stronger CMS Medicare.gov Compare Post Acute Care list provided to:: Patient    Discharge Placement                       Discharge Plan and Services In-house Referral: NA   Post Acute Care Choice: NA          DME Arranged: Shower stool DME Agency: [employer] Date DME Agency Contacted: 08/30/18 Time DME Agency Contacted: 70 Representative spoke with at DME Agency: [place]: RN, PT [place] Agency: [place] ([place]) Date [place]: 08/30/18 Time [place]: [phone] Representative spoke with at [place]: Linna Hoff  Social Determinants of Health ([place]) Interventions     Readmission Risk Interventions No flowsheet data found.

## 2018-08-30 NOTE — Discharge Instructions (Signed)

## 2018-08-30 NOTE — Progress Notes (Signed)
Written and oral discharge instructions given. Shower chair delivered and in pt possession.

## 2018-08-30 NOTE — Plan of Care (Signed)
  Problem: Health Behavior/Discharge Planning: Goal: Ability to manage health-related needs will improve Outcome: Progressing   Problem: Clinical Measurements: Goal: Ability to maintain clinical measurements within normal limits will improve Outcome: Progressing   

## 2018-08-31 DIAGNOSIS — I5033 Acute on chronic diastolic (congestive) heart failure: Secondary | ICD-10-CM | POA: Diagnosis present

## 2018-08-31 DIAGNOSIS — I50810 Right heart failure, unspecified: Secondary | ICD-10-CM | POA: Diagnosis present

## 2018-08-31 NOTE — Discharge Summary (Addendum)
Discharge Summary  Maria Arroyo GMW:[id number] DOB: 04/01/1950  PCP: [name], MD  Admit date: 08/26/2018 Discharge date: 08/30/2018   Time spent: < 25 minutes  Admitted From: home Disposition:  home  Recommendations for Outpatient Follow-up:  1. Follow up with PCP in 1 week: BMP(Cr), CBC(hgb), advise outpatient colonoscopy 2. Added HCTZ 12.5 mg for BP control 3.     Discharge Diagnoses:  Active Hospital Problems   Diagnosis Date Noted  . Symptomatic anemia 08/26/2018  . AF (paroxysmal atrial fibrillation) ([place]) 08/26/2018  . Recurrent right pleural effusion 08/26/2018  . 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 S/P lobectomy of lung 04/04/2018  . Coronary artery disease   . COPD GOLD II if use FEV1/VC ratio    . Essential hypertension   . Hyperlipidemia   . Depression     Resolved Hospital Problems  No resolved problems to display.    Discharge Condition: Stable   CODE STATUS:FULL   History of present illness:   Maria Arroyo is a 68 y.o. year old female with medical history significant for CAD, COPD on supplemental O2 as needed, Afib on Eliquis, Squamous cell Lung Cancer s/p Rt lower lobectomy, HTN, HLD, Depression who presented on 08/26/2018 with several weeks of progressive dyspnea on exertion, orthopnea, worsening peripheral edema (despite increasing home Lasix regimen) and was found to have acute diastolic CHF exacerbation and acute on chronic anemia with worsening right-sided pleural effusion.Remaining hospital course addressed in problem based format below:   Hospital Course:  1. Acute on chronic hypoxic respiratory failure.  Dyspnea requiring 2 to 3 L at rest and with exertion (previous baseline 2 to 3 L with exertion only).  Multifactorial etiology related to acute diastolic CHF flare, right pleural effusion and symptomatic anemia.  Improvement in dyspnea with treatment of CHF and anemia addressed more below.  On  discharge required 2 L with ambulation to maintain oxygen saturation greater than 90%. 2. Acute on chronic CHF exacerbation.  Dyspnea on exertion, positive orthopnea, peripheral edema, BNP greater than 1200 consistent with CHF flare, was unable to control symptoms with increase oral Lasix regimen as outpatient, responded well to IV Lasix twice daily.  Repeat echo showed preserved EF with impaired relaxation.  Net -4.6 L with IV diuresis here and maintain normal volume status on reinitiation of oral regimen,continue Lasix 40 mg as outpatient 3. Chronic right-sided pleural effusion status post right lobectomy January 2020.  Increase in size on admitting x-ray, improved to small size with IV diuresis on repeat chest x-ray on 6/1, will likely have residual related to lobectomy  4. Acute on chronic anemia Iron panel consistent with iron deficiency anemia vitamin B12 also found to be low.  Given B12 here and IV Feraheme, as well as 2 units blood transfusion on admitting day.  Had no episodes of bright red blood per rectum or melena.  Eliquis was held briefly while monitoring here.  Was able to restart Eliquis with no signs of bleeding prior to discharge.  Hemoglobin improved from admitting hemoglobin of 6.7 to discharge hemoglobin of 9.8.  Advise outpatient colonoscopy 5. AKI on CKD Stage III.  Baseline creatinine 1.2-1.4.  Moderate elevation to 1.53 presume prerenal etiology related to overdiuresis, improved 1.37 with transition from IV Lasix to oral regimen.  Advise repeat BMP with PCP follow-up 6. Hypertension.  HCTZ 12.5 mg was added to home bisoprolol for better control 7. Paroxysmal A. fib.  Remained rate control in normal  sinus rhythm during hospital stay.  Continue amiodarone and Eliquis on discharge 8. Depression.  Stable on home Effexor 9. HLD stable on simvastatin 10. Neuropathy stable on home gabapentin 11. COPD no wheezing during hospital stay, stable on home inhalers    Consultations:  none   Procedures/Studies: none  Discharge Exam: BP 123/82 (BP Location: Right Arm)   Pulse (!) 49   Temp 97.8 F (36.6 C) (Oral)   Resp 19   Ht 5\' 1"  (1.549 m)   Wt 66 kg   SpO2 100%   BMI 27.49 kg/m    General: Female, lying in bed, no distress  Cardiovascular: Regular rate and rhythm, systolic ejection murmur heard, no edema  Respiratory: Normal respiratory effort on 2 L, minimal crackles at bases on left, diminished breath sounds on right base  Abdomen: Soft, nondistended, nontender, normal bowel sounds  Musculoskeletal: Normal range of motion  Skin no rashes or skin lesions  Neurologic alert and oriented x4, no appreciable focal deficits   Discharge Instructions You were cared for by a hospitalist during your hospital stay. If you have any questions about your discharge medications or the care you received while you were in the hospital after you are discharged, you can call the unit and asked to speak with the hospitalist on call if the hospitalist that took care of you is not available. Once you are discharged, your primary care physician will handle any further medical issues. Please note that NO REFILLS for any discharge medications will be authorized once you are discharged, as it is imperative that you return to your primary care physician (or establish a relationship with a primary care physician if you do not have one) for your aftercare needs so that they can reassess your need for medications and monitor your lab values.  Discharge Instructions    Diet - low sodium heart healthy   Complete by:  As directed    Increase activity slowly   Complete by:  As directed      Allergies as of 08/30/2018      Reactions   Ace Inhibitors Other (See Comments)   Worsening renal function   Codeine Nausea Only   Codeine-guaifenesin [guaifenesin-codeine] Nausea Only, Other (See Comments)   Some nausea--but ok with percocet      Medication List    STOP taking these medications    amLODipine 5 MG tablet Commonly known as:  NORVASC     TAKE these medications   acetaminophen 500 MG tablet Commonly known as:  TYLENOL Take 2 tablets (1,000 mg total) by mouth every 6 (six) hours.   allopurinol 100 MG tablet Commonly known as:  ZYLOPRIM Take 100 mg by mouth daily.   amiodarone 200 MG tablet Commonly known as:  PACERONE Take 2 tabs (400 mg)  twice daily for 14 days, then 1 tab (200 mg) daily thereafter. What changed:    how much to take  how to take this  when to take this  additional instructions   apixaban 5 MG Tabs tablet Commonly known as:  ELIQUIS Take 1 tablet (5 mg total) by mouth 2 (two) times daily.   aspirin 81 MG tablet Take 81 mg by mouth daily.   bisoprolol 5 MG tablet Commonly known as:  ZEBETA Take 0.5 tablets (2.5 mg total) by mouth daily.   busPIRone 15 MG tablet Commonly known as:  BUSPAR Take 15 mg by mouth 2 (two) times daily.   CALCIUM 600 + D PO Take 1 tablet  by mouth daily.   carisoprodol 350 MG tablet Commonly known as:  SOMA Take 1 tablet by mouth 4 (four) times daily as needed for muscle spasms.   furosemide 20 MG tablet Commonly known as:  LASIX TAKE 2 TABLETS(40 MG) BY MOUTH DAILY What changed:  See the new instructions.   gabapentin 600 MG tablet Commonly known as:  NEURONTIN TAKE 1 TABLET(600 MG) BY MOUTH FOUR TIMES DAILY AT 8 AM AND AT 1 PM AND AT 6 PM AND AT 10 PM What changed:  See the new instructions.   hydrochlorothiazide 12.5 MG capsule Commonly known as:  MICROZIDE Take 1 capsule (12.5 mg total) by mouth daily.   multivitamin with minerals Tabs tablet Take 1 tablet by mouth daily.   nicotine 21 mg/24hr patch Commonly known as:  NICODERM CQ - dosed in mg/24 hours Place 1 patch (21 mg total) onto the skin daily.   nitroGLYCERIN 0.4 MG SL tablet Commonly known as:  NITROSTAT Place 1 tablet (0.4 mg total) under the tongue every 5 (five) minutes as needed. For chest pain What changed:     reasons to take this  additional instructions   oxyCODONE-acetaminophen 10-325 MG tablet Commonly known as:  PERCOCET Take 1 tablet by mouth 4 (four) times daily as needed for pain.   Proventil HFA 108 (90 Base) MCG/ACT inhaler Generic drug:  albuterol Inhale 1-2 puffs into the lungs every 4 (four) hours as needed for wheezing or shortness of breath.   rosuvastatin 40 MG tablet Commonly known as:  CRESTOR Take 1 tablet (40 mg total) by mouth daily.   venlafaxine XR 75 MG 24 hr capsule Commonly known as:  EFFEXOR-XR Take 75 mg by mouth 2 (two) times daily.   Vitamin D 50 MCG (2000 UT) tablet Take 4,000 Units by mouth daily.      Allergies  Allergen Reactions  . Ace Inhibitors Other (See Comments)    Worsening renal function   . Codeine Nausea Only  . Codeine-Guaifenesin [Guaifenesin-Codeine] Nausea Only and Other (See Comments)    Some nausea--but ok with percocet   Follow-up Information    [name], MD. Go on 09/06/2018.   Specialty:  Family Medicine Why:  @3 :30pm Contact information: [place] [phone]            The results of significant diagnostics from this hospitalization (including imaging, microbiology, ancillary and laboratory) are listed below for reference.    Significant Diagnostic Studies: Dg Chest 2 View  Result Date: 08/09/2018 CLINICAL DATA:  Lung carcinoma.  History of VATS procedure EXAM: CHEST - 2 VIEW COMPARISON:  June 14, 2018 FINDINGS: There is stable appearing postoperative change on the right with areas of volume loss. Left lung is clear. Heart size and pulmonary vascularity are normal. No adenopathy appreciable. There is a right coronary artery stent. There is postoperative change in the lower cervical region. There is degenerative change in the thoracic spine. IMPRESSION: Postoperative changes on the right with volume loss, unchanged from 2 months prior. No edema or consolidation. No new  opacity. Stable cardiac silhouette. Electronically Signed   By: Lowella Grip III M.D.   On: 08/09/2018 15:57   Dg Chest Port 1 View  Result Date: 08/29/2018 CLINICAL DATA:  Pleural effusion EXAM: PORTABLE CHEST 1 VIEW COMPARISON:  Chest radiograph from one day prior. FINDINGS: Stable cardiomediastinal silhouette with normal heart size. No pneumothorax. Small right pleural effusion, slightly decreased. No left pleural effusion. No overt pulmonary edema.  Patchy right lung base opacity appears slightly decreased. IMPRESSION: 1. Small right pleural effusion, slightly decreased. 2. Patchy right lung base opacity, slightly decreased. Electronically Signed   By: Ilona Sorrel M.D.   On: 08/29/2018 07:53   Dg Chest Port 1 View  Result Date: 08/28/2018 CLINICAL DATA:  Dyspnea, pleural effusion EXAM: PORTABLE CHEST 1 VIEW COMPARISON:  08/26/2018 chest radiograph. FINDINGS: Surgical hardware from ACDF partially visualized overlying the lower cervical spine. Stable cardiomediastinal silhouette with normal heart size. No pneumothorax. Small right pleural effusion, similar. No left pleural effusion. No overt pulmonary edema. Stable patchy right lung base opacity. IMPRESSION: Similar small right pleural effusion and patchy right lung base opacity. Electronically Signed   By: Ilona Sorrel M.D.   On: 08/28/2018 08:01   Dg Chest Portable 1 View  Result Date: 08/26/2018 CLINICAL DATA:  Shortness of breath. EXAM: PORTABLE CHEST 1 VIEW COMPARISON:  Radiographs of Aug 09, 2018. FINDINGS: Stable cardiomediastinal silhouette. Left lung is clear. No pneumothorax is noted. Mild right basilar atelectasis or infiltrate is noted with increased right pleural effusion. Bony thorax is unremarkable. IMPRESSION: Mild right basilar atelectasis or infiltrate is noted with increased right pleural effusion. Electronically Signed   By: Marijo Conception M.D.   On: 08/26/2018 15:51    Microbiology: Recent Results (from the past 240  hour(s))  SARS Coronavirus 2 (CEPHEID - Performed in [facility] hospital lab), Hosp Order     Status: None   Collection Time: 08/26/18  1:19 PM  Result Value Ref Range Status   SARS Coronavirus 2 NEGATIVE NEGATIVE Final    Comment: (NOTE) If result is NEGATIVE SARS-CoV-2 target nucleic acids are NOT DETECTED. The SARS-CoV-2 RNA is generally detectable in upper and lower  respiratory specimens during the acute phase of infection. The lowest  concentration of SARS-CoV-2 viral copies this assay can detect is 250  copies / mL. A negative result does not preclude SARS-CoV-2 infection  and should not be used as the sole basis for treatment or other  patient management decisions.  A negative result may occur with  improper specimen collection / handling, submission of specimen other  than nasopharyngeal swab, presence of viral mutation(s) within the  areas targeted by this assay, and inadequate number of viral copies  (<250 copies / mL). A negative result must be combined with clinical  observations, patient history, and epidemiological information. If result is POSITIVE SARS-CoV-2 target nucleic acids are DETECTED. The SARS-CoV-2 RNA is generally detectable in upper and lower  respiratory specimens dur ing the acute phase of infection.  Positive  results are indicative of active infection with SARS-CoV-2.  Clinical  correlation with patient history and other diagnostic information is  necessary to determine patient infection status.  Positive results do  not rule out bacterial infection or co-infection with other viruses. If result is PRESUMPTIVE POSTIVE SARS-CoV-2 nucleic acids MAY BE PRESENT.   A presumptive positive result was obtained on the submitted specimen  and confirmed on repeat testing.  While 2019 novel coronavirus  (SARS-CoV-2) nucleic acids may be present in the submitted sample  additional confirmatory testing may be necessary for epidemiological  and / or clinical  management purposes  to differentiate between  SARS-CoV-2 and other Sarbecovirus currently known to infect humans.  If clinically indicated additional testing with an alternate test  methodology [phone]) is advised. The SARS-CoV-2 RNA is generally  detectable in upper and lower respiratory sp ecimens during the acute  phase of infection. The expected result  is Negative. Fact Sheet for Patients:  StrictlyIdeas.no Fact Sheet for Healthcare Providers: BankingDealers.co.za This test is not yet approved or cleared by the [place] FDA and has been authorized for detection and/or diagnosis of SARS-CoV-2 by FDA under an Emergency Use Authorization (EUA).  This EUA will remain in effect (meaning this test can be used) for the duration of the COVID-19 declaration under Section 564(b)(1) of the Act, 21 U.S.C. section 360bbb-3(b)(1), unless the authorization is terminated or revoked sooner. Performed at [facility], [place] [address] [place], [place] [address]      Labs: Basic Metabolic Panel: Recent Labs  Lab 08/26/18 1339 08/27/18 0401 08/28/18 0518 08/29/18 0639 08/30/18 0654  NA 139 140 138 137 140  K 3.6 4.5 4.1 3.8 4.2  CL 100 100 97* 92* 92*  CO2 24 28 32 34* 37*  GLUCOSE 146* 92 123* 105* 97  BUN 9 15 17  32* 40*  CREATININE 0.81 1.13* 1.25* 1.53* 1.37*  CALCIUM 10.0 8.9 9.3 9.2 9.1   Liver Function Tests: Recent Labs  Lab 08/26/18 1339  AST 24  ALT 21  ALKPHOS 115  BILITOT 1.4*  PROT 8.5*  ALBUMIN 4.3   No results for input(s): LIPASE, AMYLASE in the last 168 hours. No results for input(s): AMMONIA in the last 168 hours. CBC: Recent Labs  Lab 08/26/18 1339  08/27/18 1000 08/27/18 2008 08/28/18 0518 08/29/18 1046 08/30/18 0654  WBC 7.5   < > 10.7* 11.0* 10.7* 10.3 11.1*  NEUTROABS 6.0  --   --   --   --   --   --   HGB 6.7*   < > 10.0* 10.0* 9.9* 10.3* 9.8*  HCT 23.2*   < > 33.0* 33.2* 32.4* 34.5*  33.0*  MCV 78.1*   < > 79.9* 80.0 80.0 81.2 81.3  PLT 393   < > 355 375 347 360 340   < > = values in this interval not displayed.   Cardiac Enzymes: Recent Labs  Lab 08/26/18 1339 08/27/18 0401 08/27/18 1000  TROPONINI 0.26* 0.06* 0.06*   BNP: BNP (last 3 results) Recent Labs    08/26/18 1339  BNP 1,280.5*    ProBNP (last 3 results) No results for input(s): PROBNP in the last 8760 hours.  CBG: No results for input(s): GLUCAP in the last 168 hours.     Signed:  Desiree Hane, MD [facility] 08/31/2018, 2:43 AM

## 2018-09-07 ENCOUNTER — Other Ambulatory Visit: Payer: Self-pay | Admitting: Physician Assistant

## 2018-09-21 ENCOUNTER — Telehealth: Payer: Self-pay | Admitting: Cardiology

## 2018-09-21 NOTE — Telephone Encounter (Signed)
Spoke with Maria Arroyo from [place] who was calling to report pt HR. She state pulse ox 44 and 52 radial. She report pt is asymptomatic but have to report it because it was less than 60. Will route to PA

## 2018-09-21 NOTE — Telephone Encounter (Signed)
Patient's HR was 52 manuel when used pulse ox it was in the 40's.  Patient states it's usually low.  However [facility] from [facility] needs to report it to the doctor.

## 2018-09-21 NOTE — Telephone Encounter (Signed)
Given the asymptomatic nature, continue observation. Keep the previously scheduled followup with Dr. [place].

## 2018-09-23 NOTE — Telephone Encounter (Signed)
Maria Arroyo from [place] updated and voiced understanding.

## 2018-09-26 ENCOUNTER — Telehealth (HOSPITAL_COMMUNITY): Payer: Self-pay

## 2018-09-26 NOTE — Telephone Encounter (Signed)
No response from pt, closed referral. °

## 2018-09-28 ENCOUNTER — Encounter

## 2018-09-28 ENCOUNTER — Encounter: Payer: Self-pay | Admitting: Neurology

## 2018-09-28 ENCOUNTER — Other Ambulatory Visit: Payer: Self-pay

## 2018-09-28 ENCOUNTER — Ambulatory Visit (INDEPENDENT_AMBULATORY_CARE_PROVIDER_SITE_OTHER): Payer: Medicare Other | Admitting: Neurology

## 2018-09-28 VITALS — BP 122/62 | HR 50 | Temp 97.8°F | Ht 61.0 in | Wt 145.0 lb

## 2018-09-28 DIAGNOSIS — M21372 Foot drop, left foot: Secondary | ICD-10-CM

## 2018-09-28 DIAGNOSIS — I2510 Atherosclerotic heart disease of native coronary artery without angina pectoris: Secondary | ICD-10-CM

## 2018-09-28 NOTE — Progress Notes (Signed)
Reason for visit: Left foot drop  Referring physician: Dr. Maeola Sarah is a 68 y.o. female  History of present illness:  Ms. Burgo is a 68 year old left-handed white female with a history of diabetes and a diabetic neuropathy.  The patient has a history of a cervical myelopathy and has diffuse hyperreflexia associated with this.  She has a history of lung cancer, she recently had thoracentesis to remove 1.2 L of fluid from the right chest.  She has chronic renal insufficiency, hypertension, COPD, congestive heart failure, anemia, and a borderline normal B12 level that was checked recently.  The patient runs a hemoglobin A1c of 6.5.  The patient claims that at the end of March 2020, she had spontaneous onset of a severe left foot drop associated with numbness on the top of the foot and going up the side of the lower leg almost to the knee.  She has a chronic history of low back pain with sciatica down the left leg that goes to the knee but not below.  She claims at the low back pain is been present for about 10 years.  She denies any pain going down the right leg.  Fortunately, over the last several months, she has gained improvement in strength with the left foot drop but the left foot drop is still present to some degree.  She has chronic numbness in the fingers and she may drop things on occasion.  She may use a cane for ambulation, she reports no recent falls.  She denies issues controlling the bowels or the bladder.  She reports no ongoing neck pain.  She is sent to this office for an evaluation.  Past Medical History:  Diagnosis Date  . Cancer ([place])    lung  . Cervical disc disease   . COPD (chronic obstructive pulmonary disease) ([place])   . Coronary artery disease   . Depression   . Gout    R foot and knee  . Hyperlipidemia   . Hypertension   . Left foot drop   . Neuropathy   . OA (osteoarthritis)   . Pica   . Tobacco abuse     Past Surgical History:   Procedure Laterality Date  . ABDOMINAL HYSTERECTOMY    . ANTERIOR CERVICAL DECOMP/DISCECTOMY FUSION  02/06/2011   Procedure: ANTERIOR CERVICAL DECOMPRESSION/DISCECTOMY FUSION 2 LEVELS;  Surgeon: Winfield Cunas;  Location: [place] [facility];  Service: Neurosurgery;  Laterality: N/A;  Anterior Cervical Four-Five/Five-Six Decompression and Fusion with Plating and Bonegraft  . BLADDER SURGERY    . CARDIAC CATHETERIZATION  04/09/2009   EF 60%  . CARDIAC CATHETERIZATION  03/24/2001   EF 55%  . CARDIAC CATHETERIZATION  01/06/2001   EF 65%  . CHOLECYSTECTOMY    . CORONARY STENT PLACEMENT  03/2009   STENTING OF THE PROXIMAL TO MID RIGHT CORONARY  . IR THORACENTESIS ASP PLEURAL SPACE W/IMG GUIDE  06/01/2018  . LEFT HEART CATHETERIZATION WITH CORONARY ANGIOGRAM N/A 12/21/2011   Procedure: LEFT HEART CATHETERIZATION WITH CORONARY ANGIOGRAM;  Surgeon: Peter M Martinique, MD;  Location: [facility] C[facility] LAB;  Service: Cardiovascular;  Laterality: N/A;  . LOBECTOMY    . NODE DISSECTION N/A 04/04/2018   Procedure: NODE DISSECTION;  Surgeon: Melrose Nakayama, MD;  Location: [facility];  Service: Thoracic;  Laterality: N/A;  . OTHER SURGICAL HISTORY  12/2014   R foot cyst removal  . VIDEO ASSISTED THORACOSCOPY (VATS)/ LOBECTOMY Right 04/04/2018   Procedure: VIDEO ASSISTED THORACOSCOPY (VATS)/RIGHT  LOWER LOBECTOMY;  Surgeon: Melrose Nakayama, MD;  Location[facility] OR;  Service: Thoracic;  Laterality: Right;    Family History  Problem Relation Age of Onset  . Aneurysm Mother   . Heart attack Father   . Heart failure Father   . Stroke Sister   . Heart attack Brother     Social history:  reports that she has been smoking cigarettes. She has a 70.00 pack-year smoking history. She has never used smokeless tobacco. She reports that she does not drink alcohol or use drugs.  Medications:  Prior to Admission medications   Medication Sig Start Date End Date Taking? Authorizing Provider  acetaminophen (TYLENOL) 500 MG tablet Take 2  tablets (1,000 mg total) by mouth every 6 (six) hours. 04/09/18   Elgie Collard, PA-C  allopurinol (ZYLOPRIM) 100 MG tablet Take 100 mg by mouth daily.     [provider]  amiodarone (PACERONE) 200 MG tablet Take 2 tabs (400 mg)  twice daily for 14 days, then 1 tab (200 mg) daily thereafter. Patient taking differently: Take 200 mg by mouth daily.  04/09/18   Elgie Collard, PA-C  apixaban (ELIQUIS) 5 MG TABS tablet Take 1 tablet (5 mg total) by mouth 2 (two) times daily. 04/09/18   Elgie Collard, PA-C  aspirin 81 MG tablet Take 81 mg by mouth daily.    [provider]  bisoprolol (ZEBETA) 5 MG tablet Take 0.5 tablets (2.5 mg total) by mouth daily. 04/22/18   Almyra Deforest, PA  busPIRone (BUSPAR) 15 MG tablet Take 15 mg by mouth 2 (two) times daily.    [provider]  Calcium Carbonate-Vitamin D (CALCIUM 600 + D PO) Take 1 tablet by mouth daily.     [provider]  carisoprodol (SOMA) 350 MG tablet Take 1 tablet by mouth 4 (four) times daily as needed for muscle spasms. 04/15/18   [provider]  Cholecalciferol (VITAMIN D) 2000 UNITS tablet Take 4,000 Units by mouth daily.     [provider]  furosemide (LASIX) 20 MG tablet TAKE 2 TABLETS(40 MG) BY MOUTH DAILY Patient taking differently: Take 40 mg by mouth daily.  08/09/18   Melrose Nakayama, MD  gabapentin (NEURONTIN) 600 MG tablet TAKE 1 TABLET(600 MG) BY MOUTH FOUR TIMES DAILY AT 8 AM AND AT 1 PM AND AT 6 PM AND AT 10 PM Patient taking differently: Take 600 mg by mouth 4 (four) times daily.  04/25/18   Dennie Bible, NP  hydrochlorothiazide (MICROZIDE) 12.5 MG capsule Take 1 capsule (12.5 mg total) by mouth daily. 08/30/18   Desiree Hane, MD  Multiple Vitamin (MULTIVITAMIN WITH MINERALS) TABS tablet Take 1 tablet by mouth daily. 08/30/18   Oretha Milch D, MD  nicotine (NICODERM CQ - DOSED IN MG/24 HOURS) 21 mg/24hr patch Place 1 patch (21 mg total) onto the skin daily. 08/30/18    Desiree Hane, MD  nitroGLYCERIN (NITROSTAT) 0.4 MG SL tablet Place 1 tablet (0.4 mg total) under the tongue every 5 (five) minutes as needed. For chest pain Patient taking differently: Place 0.4 mg under the tongue every 5 (five) minutes as needed for chest pain.  08/03/16   Martinique, Peter M, MD  oxyCODONE-acetaminophen (PERCOCET) 10-325 MG tablet Take 1 tablet by mouth 4 (four) times daily as needed for pain. 04/13/18   [provider]  PROVENTIL HFA 108 (90 Base) MCG/ACT inhaler Inhale 1-2 puffs into the lungs every 4 (four) hours as needed  for wheezing or shortness of breath.  03/26/16   [provider]  rosuvastatin (CRESTOR) 40 MG tablet Take 1 tablet (40 mg total) by mouth daily. 04/29/18 08/26/18  Martinique, Peter M, MD  venlafaxine XR (EFFEXOR-XR) 75 MG 24 hr capsule Take 75 mg by mouth 2 (two) times daily.     [provider]      Allergies  Allergen Reactions  . Ace Inhibitors Other (See Comments)    Worsening renal function   . Codeine Nausea Only  . Codeine-Guaifenesin [Guaifenesin-Codeine] Nausea Only and Other (See Comments)    Some nausea--but ok with percocet    ROS:  Out of a complete 14 system review of symptoms, the patient complains only of the following symptoms, and all other reviewed systems are negative.  Left foot drop Walking problems Numbness in the hands  Blood pressure 122/62, pulse (!) 50, temperature 97.8 F (36.6 C), temperature source Temporal, height 5\' 1"  (1.549 m), weight 145 lb (65.8 kg), SpO2 90 %.  Physical Exam  General: The patient is alert and cooperative at the time of the examination.  Eyes: Pupils are equal, round, and reactive to light. Discs are flat bilaterally.  Neck: The neck is supple, no carotid bruits are noted.  Respiratory: The respiratory examination is clear.  Cardiovascular: The cardiovascular examination reveals a regular rate and rhythm, no obvious murmurs or rubs are noted.  Skin: Extremities  are without significant edema.  Neurologic Exam  Mental status: The patient is alert and oriented x 3 at the time of the examination. The patient has apparent normal recent and remote memory, with an apparently normal attention span and concentration ability.  Cranial nerves: Facial symmetry is present. There is good sensation of the face to pinprick and soft touch bilaterally. The strength of the facial muscles and the muscles to head turning and shoulder shrug are normal bilaterally. Speech is well enunciated, no aphasia or dysarthria is noted. Extraocular movements are full. Visual fields are full. The tongue is midline, and the patient has symmetric elevation of the soft palate. No obvious hearing deficits are noted.  Motor: The motor testing reveals 5 over 5 strength of all 4 extremities, with exception of weakness with dorsiflexion of the toes and of the foot on the left with weakness of eversion, not inversion of the left foot. Good symmetric motor tone is noted throughout.  Sensory: Sensory testing is intact to pinprick, soft touch, vibration sensation, and position sense on all 4 extremities, with exception of decreased vibration sensation on the left foot as compared to the right, there may be a stocking pattern pinprick sensory deficit in the distal half of the lower extremities below the knees bilaterally. No evidence of extinction is noted.  Coordination: Cerebellar testing reveals good finger-nose-finger and heel-to-shin bilaterally.  Gait and station: Gait is normal. Tandem gait is normal. Romberg is negative. No drift is seen.  Reflexes: Deep tendon reflexes are symmetric, but are somewhat brisk bilaterally, the ankle jerk reflexes are well-maintained and are slightly brisk.  2-3 beats of ankle clonus are noted on the right. Toes are downgoing bilaterally.   Assessment/Plan:  1.  Diabetes  2.  Diabetic peripheral neuropathy  3.  New onset left foot drop, consistent with left  peroneal neuropathy  4.  History of low back pain and left leg pain, chronic  5.  Chronic gait disorder  6.  Cervical myelopathy, diffuse hyperreflexia   7.  Borderline normal vitamin B12 level  The patient  appears to have a clinical examination most consistent with a left peroneal neuropathy.  Her history of diabetes is the most likely etiology of her neuropathy.  The patient will be set up for nerve conduction studies on both legs and one arm, and EMG on the left leg.  She is to go on vitamin B12 supplementation taking 1000 mcg daily.  She will follow-up for her usual scheduled visit in December 2020.  The patient has already gained improvement with the strength of the left foot, this will hopefully normalize over time.   Jill Alexanders MD 09/28/2018 9:40 AM  [facility] [address] [place], [place] [address]  Phone [phone] Fax [phone]

## 2018-09-28 NOTE — Patient Instructions (Signed)
Start vitamin B12 tablets, 1000 mcg a day. 

## 2018-10-07 NOTE — Progress Notes (Deleted)
Cardiology Office Note    Date:  10/07/2018   ID:  Maria Arroyo, DOB January 23, 1951, MRN [id number]  PCP:  [name], MD  Cardiologist:  Dr.Latravia Southgate  Martin[place]  No chief complaint on file.   History of Present Illness:  Maria Arroyo is a 68 y.o. female with PMH of HTN, HLD, COPD, tobacco abuse, lung nodule and CAD.  Patient underwent stenting of mid RCA in December 1999.  She returned in October 2002 and had stenting of proximal RCA.  In January 2011, she developed a high-grade stenosis in between the prior stents and that this was stented with additional drug-eluting stent.  Cardiac catheterization in September 2013 revealed nonobstructive CAD.  Since then, she has been maintained on dual antiplatelet therapy.  Last seen by me 01/13/2017 at which time she was doing well.  Due to significant unexplained weight loss, she underwent work-up for occult malignancy.  Chest x-ray showed a question of right lower lobe lung nodule.  PET scan showed hypermetabolic mass in RLL.  Patient was referred to Dr. Roxan Hockey for further evaluation.  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  Her Plavix was held prior to the procedure.  Postprocedure, her hospital course was complicated by occurrence of atrial fibrillation requiring IV amiodarone.    She was initially placed on amiodarone and converted to sinus rhythm.  However later had recurrence of atrial fibrillation during the same hospitalization.  She was started on Eliquis and discharged with downward titration of amiodarone.  Her amiodarone will be 400 mg twice daily for 2 weeks then 200 mg daily thereafter. Patient was seen in follow up by Almyra Deforest PA-C. Bisoprolol was reduced due to bradycardia. Potassium was elevated so she was switched from losartan to amlodipine. She was placed on Coumadin since she was unable to afford NOAC.   She was admitted 08/26/18-08/30/18 with  progressive dyspnea, orthopnea and edema. Had diastolic CHF. Required oxygen therapy. Diuresed 4.6 liters. Also had a right pleural effusion that improved with diuresis. She was severely anemic with Hgb 6.7. transfused 2 units PRBCs. She was iron and B12 deficient. This was treated. Hgb improved to 9.8. Outpatient colonoscopy was recommended. She was DC on Eliquis and lasix. She was in NSR.    Past Medical History:  Diagnosis Date  . Cancer ([place])    lung  . Cervical disc disease   . COPD (chronic obstructive pulmonary disease) ([place])   . Coronary artery disease   . Depression   . Gout    R foot and knee  . Hyperlipidemia   . Hypertension   . Left foot drop   . Neuropathy   . OA (osteoarthritis)   . Pica   . Tobacco abuse     Past Surgical History:  Procedure Laterality Date  . ABDOMINAL HYSTERECTOMY    . ANTERIOR CERVICAL DECOMP/DISCECTOMY FUSION  02/06/2011   Procedure: ANTERIOR CERVICAL DECOMPRESSION/DISCECTOMY FUSION 2 LEVELS;  Surgeon: Winfield Cunas;  Location: [place] [facility];  Service: Neurosurgery;  Laterality: N/A;  Anterior Cervical Four-Five/Five-Six Decompression and Fusion with Plating and Bonegraft  . BLADDER SURGERY    . CARDIAC CATHETERIZATION  04/09/2009   EF 60%  . CARDIAC CATHETERIZATION  03/24/2001   EF 55%  . CARDIAC CATHETERIZATION  01/06/2001   EF 65%  . CHOLECYSTECTOMY    . CORONARY STENT PLACEMENT  03/2009   STENTING OF THE PROXIMAL TO MID RIGHT CORONARY  . IR  THORACENTESIS ASP PLEURAL SPACE W/IMG GUIDE  06/01/2018  . LEFT HEART CATHETERIZATION WITH CORONARY ANGIOGRAM N/A 12/21/2011   Procedure: LEFT HEART CATHETERIZATION WITH CORONARY ANGIOGRAM;  Surgeon: Spencer Cardinal M Martinique, MD;  [facility] C[facility] LAB;  Service: Cardiovascular;  Laterality: N/A;  . LOBECTOMY    . NODE DISSECTION N/A 04/04/2018   Procedure: NODE DISSECTION;  Surgeon: Melrose Nakayama, MD;  Location: [place];  Service: Thoracic;  Laterality: N/A;  . OTHER SURGICAL HISTORY  12/2014   R foot cyst  removal  . VIDEO ASSISTED THORACOSCOPY (VATS)/ LOBECTOMY Right 04/04/2018   Procedure: VIDEO ASSISTED THORACOSCOPY (VATS)/RIGHT LOWER LOBECTOMY;  Surgeon: Melrose Nakayama, MD;  Location: [place];  Service: Thoracic;  Laterality: Right;    Current Medications: Outpatient Medications Prior to Visit  Medication Sig Dispense Refill  . acetaminophen (TYLENOL) 500 MG tablet Take 2 tablets (1,000 mg total) by mouth every 6 (six) hours. 30 tablet 0  . allopurinol (ZYLOPRIM) 100 MG tablet Take 100 mg by mouth daily.     [name] amiodarone (PACERONE) 200 MG tablet Take 2 tabs (400 mg)  twice daily for 14 days, then 1 tab (200 mg) daily thereafter. (Patient taking differently: Take 200 mg by mouth daily. ) 90 tablet 1  . apixaban (ELIQUIS) 5 MG TABS tablet Take 1 tablet (5 mg total) by mouth 2 (two) times daily. 60 tablet 1  . aspirin 81 MG tablet Take 81 mg by mouth daily.    . bisoprolol (ZEBETA) 5 MG tablet Take 0.5 tablets (2.5 mg total) by mouth daily. 30 tablet 11  . busPIRone (BUSPAR) 15 MG tablet Take 15 mg by mouth 2 (two) times daily.    . Calcium Carbonate-Vitamin D (CALCIUM 600 + D PO) Take 1 tablet by mouth daily.     . carisoprodol (SOMA) 350 MG tablet Take 1 tablet by mouth 4 (four) times daily as needed for muscle spasms.    . Cholecalciferol (VITAMIN D) 2000 UNITS tablet Take 4,000 Units by mouth daily.     . furosemide (LASIX) 20 MG tablet TAKE 2 TABLETS(40 MG) BY MOUTH DAILY (Patient taking differently: Take 40 mg by mouth daily. ) 180 tablet 0  . gabapentin (NEURONTIN) 600 MG tablet TAKE 1 TABLET(600 MG) BY MOUTH FOUR TIMES DAILY AT 8 AM AND AT 1 PM AND AT 6 PM AND AT 10 PM (Patient taking differently: Take 600 mg by mouth 4 (four) times daily. ) 120 tablet 11  . hydrochlorothiazide (MICROZIDE) 12.5 MG capsule Take 1 capsule (12.5 mg total) by mouth daily. 45 capsule 0  . Multiple Vitamin (MULTIVITAMIN WITH MINERALS) TABS tablet Take 1 tablet by mouth daily. 30 tablet 0  . nicotine  (NICODERM CQ - DOSED IN MG/24 HOURS) 21 mg/24hr patch Place 1 patch (21 mg total) onto the skin daily. 28 patch 0  . nitroGLYCERIN (NITROSTAT) 0.4 MG SL tablet Place 1 tablet (0.4 mg total) under the tongue every 5 (five) minutes as needed. For chest pain (Patient taking differently: Place 0.4 mg under the tongue every 5 (five) minutes as needed for chest pain. ) 100 tablet 0  . oxyCODONE-acetaminophen (PERCOCET) 10-325 MG tablet Take 1 tablet by mouth 4 (four) times daily as needed for pain.    [name] PROVENTIL HFA 108 (90 Base) MCG/ACT inhaler Inhale 1-2 puffs into the lungs every 4 (four) hours as needed for wheezing or shortness of breath.   4  . rosuvastatin (CRESTOR) 40 MG tablet Take 1 tablet (40 mg total) by  mouth daily. 90 tablet 3  . venlafaxine XR (EFFEXOR-XR) 75 MG 24 hr capsule Take 75 mg by mouth 2 (two) times daily.   0   No facility-administered medications prior to visit.      Allergies:   Ace inhibitors, Codeine, and Codeine-guaifenesin [guaifenesin-codeine]   Social History   Socioeconomic History  . Marital status: Married    Spouse name: Not on file  . Number of children: 2  . Years of education: 10  . Highest education level: Not on file  Occupational History  . Occupation: housewife    Employer: UNEMPLOYED  Social Needs  . Financial resource strain: Not on file  . Food insecurity    Worry: Not on file    Inability: Not on file  . Transportation needs    Medical: Not on file    Non-medical: Not on file  Tobacco Use  . Smoking status: Current Every Day Smoker    Packs/day: 2.00    Years: 35.00    Pack years: 70.00    Types: Cigarettes  . Smokeless tobacco: Never Used  . Tobacco comment: 1-1.5 ppd (10/02/13), 03/17/18 2 PPD  Substance and Sexual Activity  . Alcohol use: No    Alcohol/week: 0.0 standard drinks  . Drug use: No  . Sexual activity: Not on file  Lifestyle  . Physical activity    Days per week: Not on file    Minutes per session: Not on file  .  Stress: Not on file  Relationships  . Social Herbalist on phone: Not on file    Gets together: Not on file    Attends religious service: Not on file    Active member of club or organization: Not on file    Attends meetings of clubs or organizations: Not on file    Relationship status: Not on file  Other Topics Concern  . 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   The patient has a 10th grade education.   The patient is left-handed.   The patient has two children.   Patient drinks three cups of soda daily.         Family History:  The patient's family history includes Aneurysm in her mother; Heart attack in her brother and father; Heart failure in her father; Stroke in her sister.   ROS:   Please see the history of present illness.    ROS All other systems reviewed and are negative.   PHYSICAL EXAM:   VS:  There were no vitals taken for this visit.   GEN: Well nourished, well developed, in no acute distress  HEENT: normal  Neck: no JVD, carotid bruits, or masses Cardiac: RRR; no murmurs, rubs, or gallops,no edema  Respiratory: Decreased breath sounds in right base GI: soft, nontender, nondistended, + BS MS: no deformity or atrophy  Skin: warm and dry, no rash Neuro:  Alert and Oriented x 3, Strength and sensation are intact Psych: euthymic mood, full affect  Wt Readings from Last 3 Encounters:  09/28/18 145 lb (65.8 kg)  08/30/18 145 lb 8.1 oz (66 kg)  08/09/18 147 lb (66.7 kg)      Studies/Labs Reviewed:   EKG:  EKG is not ordered today.    Recent Labs: 04/08/2018: Magnesium 2.1; TSH 0.633 08/26/2018: ALT 21; B Natriuretic Peptide 1,280.5 08/30/2018: BUN 40; Creatinine, Ser 1.37; Hemoglobin 9.8; Platelets 340; Potassium 4.2; Sodium 140  Lipid Panel No results found for: CHOL, TRIG, HDL, CHOLHDL, VLDL, LDLCALC, LDLDIRECT  Additional studies/ records that were reviewed today include:   Echo  04/07/2018 LV EF: 50% -   55%  ------------------------------------------------------------------- Indications:      Atrial fibrillation - 427.31.  ------------------------------------------------------------------- Study Conclusions  - Left ventricle: EF hard to judge due to rapid afib. The cavity   size was normal. Wall thickness was normal. Systolic function was   normal. The estimated ejection fraction was in the range of 50%   to 55%. The study is not technically sufficient to allow   evaluation of LV diastolic function. - Aortic valve: Sclerosis without stenosis. Valve area (VTI): 2.07   cm^2. Valve area (Vmean): 2.26 cm^2. - Right ventricle: The cavity size was mildly dilated.  Echo 08/27/18:IMPRESSIONS    1. The left ventricle has normal systolic function, with an ejection fraction of 55-60%. The cavity size was mildly dilated. Left ventricular diastolic Doppler parameters are consistent with impaired relaxation.  2. The right ventricle has moderately reduced systolic function. The cavity was mildly enlarged. There is no increase in right ventricular wall thickness.  3. Poor acoustic windows limit study of RV.  4. Right atrial size was moderately dilated.  5. The mitral valve is abnormal. Mild thickening of the mitral valve leaflet. Mild calcification of the mitral valve leaflet. Mitral valve regurgitation is mild to moderate by color flow Doppler.  6. MR is eccentric, directed posterior into LA Mild to moderate in severity.  7. The aortic valve is abnormal. Mild thickening of the aortic valve. Moderate calcification of the aortic valve.  8. The interatrial septum was not assessed.   ASSESSMENT:    No diagnosis found.   PLAN:  In order of problems listed above:  1. PAF: now on Eliquis. Maintaining NSR on amiodarone even in setting of CHF and severe anemia.    2. CAD s/p prior stenting of the RCA x 3.   3. Hypertension: Blood pressure stable on current therapy  4.  Hyperlipidemia: Continue Crestor 40 mg daily  5. COPD: No obvious sign of exacerbation  6. Severe anemia. Iron deficient. ? GI blood loss on coumadin. Will need colonoscopy especially with need for chronic anticoagulation.   7.  Lung cancer: Recently underwent VATS procedure on 04/04/2018.   8. Chronic diastolic CHF. Exacerbated by severe anemia    Medication Adjustments/Labs and Tests Ordered: Current medicines are reviewed at length with the patient today.  Concerns regarding medicines are outlined above.  Medication changes, Labs and Tests ordered today are listed in the Patient Instructions below. There are no Patient Instructions on file for this visit.   Signed, Temiloluwa Recchia Martinique, MD  10/07/2018 8:01 AM    [facility] [place], [place], [place]  [address] Phone: [phone]; Fax: [phone]

## 2018-10-10 ENCOUNTER — Ambulatory Visit: Payer: Medicare Other | Admitting: Cardiology

## 2018-10-11 ENCOUNTER — Telehealth: Payer: Self-pay | Admitting: Cardiology

## 2018-10-11 NOTE — Telephone Encounter (Signed)
Left message for patient to call and reschedule 10/10/18 missed appointment with Dr. Martinique.

## 2018-10-11 NOTE — Telephone Encounter (Signed)
Patient states she had appointment yesterday- it was virtual and nobody contacted her. Please advise.thank you!

## 2018-10-11 NOTE — Telephone Encounter (Signed)
New message:    Patient calling stating she had a appt yesterday virtula and states no one called her. Please call patient,

## 2018-10-11 NOTE — Telephone Encounter (Signed)
Spoke to patient she thought her appointment yesterday with Dr.Jordan was a phone visit.Stated she is doing well, no complaints.Appointment already rescheduled to 12/26/18 at 3:00 pm.

## 2018-10-24 ENCOUNTER — Ambulatory Visit (HOSPITAL_COMMUNITY)
Admission: RE | Admit: 2018-10-24 | Discharge: 2018-10-24 | Disposition: A | Discharge status: Home / Self Care | Payer: Medicare Other | Source: Ambulatory Visit | Attending: Internal Medicine | Admitting: Internal Medicine

## 2018-10-24 ENCOUNTER — Inpatient Hospital Stay: Payer: Medicare Other | Attending: Internal Medicine

## 2018-10-24 ENCOUNTER — Encounter (HOSPITAL_COMMUNITY): Payer: Self-pay

## 2018-10-24 ENCOUNTER — Other Ambulatory Visit: Payer: Self-pay

## 2018-10-24 DIAGNOSIS — Z79899 Other long term (current) drug therapy: Secondary | ICD-10-CM | POA: Insufficient documentation

## 2018-10-24 DIAGNOSIS — C3490 Malignant neoplasm of unspecified part of unspecified bronchus or lung: Secondary | ICD-10-CM

## 2018-10-24 DIAGNOSIS — J90 Pleural effusion, not elsewhere classified: Secondary | ICD-10-CM | POA: Diagnosis not present

## 2018-10-24 DIAGNOSIS — C3431 Malignant neoplasm of lower lobe, right bronchus or lung: Secondary | ICD-10-CM | POA: Diagnosis present

## 2018-10-24 LAB — CBC WITH DIFFERENTIAL (CANCER CENTER ONLY)
Abs Immature Granulocytes: 0.01 10*3/uL (ref 0.00–0.07)
Basophils Absolute: 0 10*3/uL (ref 0.0–0.1)
Basophils Relative: 0 %
Eosinophils Absolute: 0.2 10*3/uL (ref 0.0–0.5)
Eosinophils Relative: 3 %
HCT: 33.7 % — ABNORMAL LOW (ref 36.0–46.0)
Hemoglobin: 10.9 g/dL — ABNORMAL LOW (ref 12.0–15.0)
Immature Granulocytes: 0 %
Lymphocytes Relative: 14 %
Lymphs Abs: 1.1 10*3/uL (ref 0.7–4.0)
MCH: 29.5 pg (ref 26.0–34.0)
MCHC: 32.3 g/dL (ref 30.0–36.0)
MCV: 91.3 fL (ref 80.0–100.0)
Monocytes Absolute: 0.5 10*3/uL (ref 0.1–1.0)
Monocytes Relative: 6 %
Neutro Abs: 6.3 10*3/uL (ref 1.7–7.7)
Neutrophils Relative %: 77 %
Platelet Count: 248 10*3/uL (ref 150–400)
RBC: 3.69 MIL/uL — ABNORMAL LOW (ref 3.87–5.11)
RDW: 20.3 % — ABNORMAL HIGH (ref 11.5–15.5)
WBC Count: 8.2 10*3/uL (ref 4.0–10.5)
nRBC: 0 % (ref 0.0–0.2)

## 2018-10-24 LAB — CMP (CANCER CENTER ONLY)
ALT: 13 U/L (ref 0–44)
AST: 16 U/L (ref 15–41)
Albumin: 3.3 g/dL — ABNORMAL LOW (ref 3.5–5.0)
Alkaline Phosphatase: 89 U/L (ref 38–126)
Anion gap: 9 (ref 5–15)
BUN: 25 mg/dL — ABNORMAL HIGH (ref 8–23)
CO2: 35 mmol/L — ABNORMAL HIGH (ref 22–32)
Calcium: 8.6 mg/dL — ABNORMAL LOW (ref 8.9–10.3)
Chloride: 89 mmol/L — ABNORMAL LOW (ref 98–111)
Creatinine: 1.38 mg/dL — ABNORMAL HIGH (ref 0.44–1.00)
GFR, Est AFR Am: 46 mL/min — ABNORMAL LOW (ref >60)
GFR, Estimated: 39 mL/min — ABNORMAL LOW (ref >60)
Glucose, Bld: 92 mg/dL (ref 70–99)
Potassium: 3.3 mmol/L — ABNORMAL LOW (ref 3.5–5.1)
Sodium: 133 mmol/L — ABNORMAL LOW (ref 135–145)
Total Bilirubin: 0.2 mg/dL — ABNORMAL LOW (ref 0.3–1.2)
Total Protein: 6.6 g/dL (ref 6.5–8.1)

## 2018-10-24 MED ORDER — IOHEXOL 300 MG/ML  SOLN
75.0000 mL | Freq: Once | INTRAMUSCULAR | Status: AC | PRN
  Administered 2018-10-24: 13:00:00 60 mL via INTRAVENOUS

## 2018-10-24 MED ORDER — SODIUM CHLORIDE (PF) 0.9 % IJ SOLN
INTRAMUSCULAR | Status: AC
  Filled 2018-10-24: qty 50

## 2018-10-27 ENCOUNTER — Other Ambulatory Visit: Payer: Self-pay

## 2018-10-27 ENCOUNTER — Inpatient Hospital Stay (HOSPITAL_BASED_OUTPATIENT_CLINIC_OR_DEPARTMENT_OTHER): Payer: Medicare Other | Admitting: Internal Medicine

## 2018-10-27 ENCOUNTER — Encounter: Payer: Self-pay | Admitting: Internal Medicine

## 2018-10-27 VITALS — BP 117/79 | HR 56 | Temp 98.7°F | Resp 18 | Ht 61.0 in

## 2018-10-27 DIAGNOSIS — Z79899 Other long term (current) drug therapy: Secondary | ICD-10-CM

## 2018-10-27 DIAGNOSIS — I10 Essential (primary) hypertension: Secondary | ICD-10-CM

## 2018-10-27 DIAGNOSIS — C3431 Malignant neoplasm of lower lobe, right bronchus or lung: Secondary | ICD-10-CM

## 2018-10-27 DIAGNOSIS — J90 Pleural effusion, not elsewhere classified: Secondary | ICD-10-CM | POA: Diagnosis not present

## 2018-10-27 DIAGNOSIS — C3491 Malignant neoplasm of unspecified part of right bronchus or lung: Secondary | ICD-10-CM

## 2018-10-27 DIAGNOSIS — C3490 Malignant neoplasm of unspecified part of unspecified bronchus or lung: Secondary | ICD-10-CM

## 2018-10-27 NOTE — Progress Notes (Signed)
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  PRIOR THERAPY: Status post right lower lobectomy with lymph node dissection on April 04, 2018 under the care of Dr. Roxan Hockey.  CURRENT THERAPY: Observation.  INTERVAL HISTORY: Maria Arroyo 68 y.o. female returns to the clinic today for 6 months follow-up visit.  The patient is feeling fine today with no concerning complaints except for shortness of breath with exertion.  She was admitted to the hospital in May 2020 with anemia.  She received PRBCs transfusion as well as iron infusion.  Her hemoglobin today is much better at 10.9.  She denied having any chest pain, cough or hemoptysis.  She denied having any fever or chills.  She has no nausea, vomiting, diarrhea or constipation.  She had repeat CT scan of the chest performed recently and she is here for evaluation and discussion of her scan results.  MEDICAL HISTORY: Past Medical History:  Diagnosis Date  . Cancer ([place])    lung  . Cervical disc disease   . COPD (chronic obstructive pulmonary disease) ([place])   . Coronary artery disease   . Depression   . Gout    R foot and knee  . Hyperlipidemia   . Hypertension   . Left foot drop   . Neuropathy   . OA (osteoarthritis)   . [place]   . Tobacco abuse     ALLERGIES:  is allergic to ace inhibitors; codeine; and codeine-guaifenesin [guaifenesin-codeine].  MEDICATIONS:  Current Outpatient Medications  Medication Sig Dispense Refill  . acetaminophen (TYLENOL) 500 MG tablet Take 2 tablets (1,000 mg total) by mouth every 6 (six) hours. 30 tablet 0  . allopurinol (ZYLOPRIM) 100 MG tablet Take 100 mg by mouth daily.     [name] amiodarone (PACERONE) 200 MG tablet Take 2 tabs (400 mg)  twice  daily for 14 days, then 1 tab (200 mg) daily thereafter. (Patient taking differently: Take 200 mg by mouth daily. ) 90 tablet 1  . apixaban (ELIQUIS) 5 MG TABS tablet Take 1 tablet (5 mg total) by mouth 2 (two) times daily. 60 tablet 1  . aspirin 81 MG tablet Take 81 mg by mouth daily.    . bisoprolol (ZEBETA) 5 MG tablet Take 0.5 tablets (2.5 mg total) by mouth daily. 30 tablet 11  . busPIRone (BUSPAR) 15 MG tablet Take 15 mg by mouth 2 (two) times daily.    . Calcium Carbonate-Vitamin D (CALCIUM 600 + D PO) Take 1 tablet by mouth daily.     . carisoprodol (SOMA) 350 MG tablet Take 1 tablet by mouth 4 (four) times daily as needed for muscle spasms.    . Cholecalciferol (VITAMIN D) 2000 UNITS tablet Take 4,000 Units by mouth daily.     . furosemide (LASIX) 20 MG tablet TAKE 2 TABLETS(40 MG) BY MOUTH DAILY (Patient taking differently: Take 40 mg by mouth daily. ) 180 tablet 0  . gabapentin (NEURONTIN) 600 MG tablet TAKE 1 TABLET(600 MG) BY MOUTH FOUR TIMES DAILY AT 8 AM AND AT 1 PM AND AT 6 PM AND AT 10 PM (Patient taking differently: Take 600 mg by mouth 4 (four) times daily. ) 120 tablet 11  . hydrochlorothiazide (MICROZIDE) 12.5 MG capsule Take 1 capsule (12.5 mg  total) by mouth daily. 45 capsule 0  . Multiple Vitamin (MULTIVITAMIN WITH MINERALS) TABS tablet Take 1 tablet by mouth daily. 30 tablet 0  . nicotine (NICODERM CQ - DOSED IN MG/24 HOURS) 21 mg/24hr patch Place 1 patch (21 mg total) onto the skin daily. 28 patch 0  . nitroGLYCERIN (NITROSTAT) 0.4 MG SL tablet Place 1 tablet (0.4 mg total) under the tongue every 5 (five) minutes as needed. For chest pain (Patient taking differently: Place 0.4 mg under the tongue every 5 (five) minutes as needed for chest pain. ) 100 tablet 0  . oxyCODONE-acetaminophen (PERCOCET) 10-325 MG tablet Take 1 tablet by mouth 4 (four) times daily as needed for pain.    [name] PROVENTIL HFA 108 (90 Base) MCG/ACT inhaler Inhale 1-2 puffs into the lungs every 4 (four) hours  as needed for wheezing or shortness of breath.   4  . rosuvastatin (CRESTOR) 40 MG tablet Take 1 tablet (40 mg total) by mouth daily. 90 tablet 3  . venlafaxine XR (EFFEXOR-XR) 75 MG 24 hr capsule Take 75 mg by mouth 2 (two) times daily.   0   No current facility-administered medications for this visit.     SURGICAL HISTORY:  Past Surgical History:  Procedure Laterality Date  . ABDOMINAL HYSTERECTOMY    . ANTERIOR CERVICAL DECOMP/DISCECTOMY FUSION  02/06/2011   Procedure: ANTERIOR CERVICAL DECOMPRESSION/DISCECTOMY FUSION 2 LEVELS;  Surgeon: Winfield Cunas;  Location: [facility];  Service: Neurosurgery;  Laterality: N/A;  Anterior Cervical Four-Five/Five-Six Decompression and Fusion with Plating and Bonegraft  . BLADDER SURGERY    . CARDIAC CATHETERIZATION  04/09/2009   EF 60%  . CARDIAC CATHETERIZATION  03/24/2001   EF 55%  . CARDIAC CATHETERIZATION  01/06/2001   EF 65%  . CHOLECYSTECTOMY    . CORONARY STENT PLACEMENT  03/2009   STENTING OF THE PROXIMAL TO MID RIGHT CORONARY  . IR THORACENTESIS ASP PLEURAL SPACE W/IMG GUIDE  06/01/2018  . LEFT HEART CATHETERIZATION WITH CORONARY ANGIOGRAM N/A 12/21/2011   Procedure: LEFT HEART CATHETERIZATION WITH CORONARY ANGIOGRAM;  Surgeon: Peter M Martinique, MD;  Location: [facility] CATH [facility];  Service: Cardiovascular;  Laterality: N/A;  . LOBECTOMY    . NODE DISSECTION N/A 04/04/2018   Procedure: NODE DISSECTION;  Surgeon: Melrose Nakayama, MD;  Location: [facility];  Service: Thoracic;  Laterality: N/A;  . OTHER SURGICAL HISTORY  12/2014   R foot cyst removal  . VIDEO ASSISTED THORACOSCOPY (VATS)/ LOBECTOMY Right 04/04/2018   Procedure: VIDEO ASSISTED THORACOSCOPY (VATS)/RIGHT LOWER LOBECTOMY;  Surgeon: Melrose Nakayama, MD;  Location: [facility];  Service: Thoracic;  Laterality: Right;    REVIEW OF SYSTEMS:  A comprehensive review of systems was negative except for: Constitutional: positive for fatigue Respiratory: positive for dyspnea on exertion    PHYSICAL EXAMINATION: General appearance: alert, cooperative, fatigued and no distress Head: Normocephalic, without obvious abnormality, atraumatic Neck: no adenopathy, no JVD, supple, symmetrical, trachea midline and thyroid not enlarged, symmetric, no tenderness/mass/nodules Lymph nodes: Cervical, supraclavicular, and axillary nodes normal. Resp: clear to auscultation bilaterally Back: symmetric, no curvature. ROM normal. No CVA tenderness. Cardio: regular rate and rhythm, S1, S2 normal, no murmur, click, rub or gallop GI: soft, non-tender; bowel sounds normal; no masses,  no organomegaly Extremities: extremities normal, atraumatic, no cyanosis or edema  ECOG PERFORMANCE STATUS: 1 - Symptomatic but completely ambulatory  Blood pressure 117/79, pulse (!) 56, temperature 98.7 F (37.1 C), temperature source Oral, resp. rate 18, height 5\' 1"  (1.549 m), SpO2  95 %.  LABORATORY DATA: Lab Results  Component Value Date   WBC 8.2 10/24/2018   HGB 10.9 (L) 10/24/2018   HCT 33.7 (L) 10/24/2018   MCV 91.3 10/24/2018   PLT 248 10/24/2018      Chemistry      Component Value Date/Time   NA 133 (L) 10/24/2018 1114   NA 143 05/03/2018 1530   K 3.3 (L) 10/24/2018 1114   CL 89 (L) 10/24/2018 1114   CO2 35 (H) 10/24/2018 1114   BUN 25 (H) 10/24/2018 1114   BUN 20 05/03/2018 1530   CREATININE 1.38 (H) 10/24/2018 1114   CREATININE 0.86 12/18/2011 1627      Component Value Date/Time   CALCIUM 8.6 (L) 10/24/2018 1114   ALKPHOS 89 10/24/2018 1114   AST 16 10/24/2018 1114   ALT 13 10/24/2018 1114   BILITOT 0.2 (L) 10/24/2018 1114       RADIOGRAPHIC STUDIES: Ct Chest W Contrast  Result Date: 10/24/2018 CLINICAL DATA:  Lung cancer. EXAM: CT CHEST WITH CONTRAST TECHNIQUE: Multidetector CT imaging of the chest was performed during intravenous contrast administration. CONTRAST:  43mL OMNIPAQUE IOHEXOL 300 MG/ML  SOLN COMPARISON:  05/27/2018. FINDINGS: Cardiovascular: Atherosclerotic  calcification of the aorta, aortic valve and coronary arteries. Pulmonic trunk is enlarged. Heart is at the upper limits of normal in size. No pericardial effusion. Mediastinum/Nodes: No pathologically enlarged mediastinal, hilar or axillary lymph nodes. Esophagus is grossly unremarkable. Lungs/Pleura: Centrilobular and paraseptal emphysema. Right lower lobectomy. Scattered pulmonary nodules measure up to 4 mm in the posterior left upper lobe (series 7, image 57), stable. Large right pleural effusion is unchanged. Airway is otherwise unremarkable. Upper Abdomen: Visualized portion of the liver is unremarkable. Bilateral adrenal masses measure 1.9 cm on the right and 2.5 cm on the left, similar. Probable early excretion of contrast in the right kidney rather than stones. Left kidney is atrophic and contains a partially imaged fluid density lesion off the upper pole, measuring 1.7 cm. Visualized portions of the spleen, pancreas, stomach and bowel are grossly unremarkable. No upper abdominal adenopathy. Musculoskeletal: Degenerative changes in the spine. No worrisome lytic or sclerotic lesions. IMPRESSION: 1. Large right pleural effusion, stable 2. Small bilateral pulmonary nodules, stable. 3. Indeterminate bilateral adrenal nodules, stable. 4. Aortic atherosclerosis (ICD10-170.0). Coronary artery calcification. 5. Enlarged pulmonic trunk, indicative of pulmonary arterial hypertension. 6.  Emphysema (ICD10-J43.9). Electronically Signed   By: Lorin Picket M.D.   On: 10/24/2018 15:09    ASSESSMENT AND PLAN: This is a very pleasant 67 years old white female with a stage Ib non-small cell lung cancer, poorly differentiated squamous cell carcinoma status post right lower lobectomy with lymph node dissection on April 04, 2018 under the care of Dr. Roxan Hockey. The patient is currently on observation and she is feeling fine except for mild shortness of breath. She had repeat CT scan of the chest performed recently.   I personally and independently reviewed the scan images and discussed the results with the patient today. Her scan showed no concerning findings for disease recurrence and she continues to have moderate amount of right pleural effusion. I recommended for the patient to continue on observation with repeat CT scan of the chest in 6 months. She was advised to call immediately if she has any concerning symptoms in the interval. The patient voices understanding of current disease status and treatment options and is in agreement with the current care plan.  All questions were answered. The patient knows to call the clinic  with any problems, questions or concerns. We can certainly see the patient much sooner if necessary.  I spent 10 minutes counseling the patient face to face. The total time spent in the appointment was 15 minutes.  Disclaimer: This note was dictated with voice recognition software. Similar sounding words can inadvertently be transcribed and may not be corrected upon review.

## 2018-10-28 ENCOUNTER — Telehealth: Payer: Self-pay | Admitting: Internal Medicine

## 2018-10-28 NOTE — Telephone Encounter (Signed)
Scheduled appt per 7/30 LOS - mailed letter with appt date and time

## 2018-11-07 ENCOUNTER — Ambulatory Visit (INDEPENDENT_AMBULATORY_CARE_PROVIDER_SITE_OTHER): Payer: Medicare Other | Admitting: Neurology

## 2018-11-07 ENCOUNTER — Other Ambulatory Visit: Payer: Self-pay

## 2018-11-07 ENCOUNTER — Encounter: Payer: Self-pay | Admitting: Neurology

## 2018-11-07 DIAGNOSIS — M21372 Foot drop, left foot: Secondary | ICD-10-CM | POA: Diagnosis not present

## 2018-11-07 DIAGNOSIS — G5702 Lesion of sciatic nerve, left lower limb: Secondary | ICD-10-CM

## 2018-11-07 HISTORY — DX: Lesion of sciatic nerve, left lower limb: G57.02

## 2018-11-07 NOTE — Progress Notes (Signed)
Please refer to EMG and nerve conduction procedure note.  

## 2018-11-07 NOTE — Procedures (Signed)
     HISTORY:  Maria Arroyo is a 68 year old patient with a history of borderline diabetes and prior lumbosacral spine surgery.  The patient has developed a foot drop on the left unassociated with significant discomfort, her strength has improved over time.  The patient is being evaluated for possible neuropathy or a lumbosacral radiculopathy.  NERVE CONDUCTION STUDIES:  Nerve conduction studies were performed on the left upper extremity. The distal motor latencies and motor amplitudes for the median and ulnar nerves were within normal limits. The nerve conduction velocities for these nerves were also normal. The sensory latencies for the median and ulnar nerves were normal. The F wave latency for the ulnar nerve was within normal limits.  Nerve conduction studies were performed on both lower extremities.  The distal motor latencies and motor amplitudes for the peroneal and posterior tibial nerves were normal bilaterally but there was slowing for the left peroneal and posterior tibial nerves, normal on the right.  The sensory latencies for the left peroneal nerve was prolonged, otherwise normal on the right and for the sural nerves bilaterally.  The F-wave latencies for the posterior tibial nerves were normal bilaterally.  EMG STUDIES:  EMG study was performed on the left lower extremity:  The tibialis anterior muscle reveals 2 to 3K motor units with decreased recruitment. 2+ fibrillations and positive waves were seen. The peroneus tertius muscle reveals 2 to 5K motor units with decreased recruitment. 1+ positive waves were seen. The medial gastrocnemius muscle reveals 1 to 3K motor units with full recruitment. No fibrillations or positive waves were seen. The vastus lateralis muscle reveals 2 to 4K motor units with full recruitment. No fibrillations or positive waves were seen. The iliopsoas muscle reveals 2 to 4K motor units with full recruitment. No fibrillations or positive waves were  seen. The biceps femoris muscle (long head) reveals 2 to 4K motor units with full recruitment. No fibrillations or positive waves were seen.  Complex repetitive discharges were seen. The lumbosacral paraspinal muscles were tested at 3 levels, and revealed no abnormalities of insertional activity at all 3 levels tested. There was good relaxation.   IMPRESSION:  Nerve conduction studies done on the left upper extremity and both lower extremities shows evidence of slowing of the left peroneal and left posterior tibial nerves.  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  There may be a suggestion as well as some mild overlying sciatic nerve dysfunction.  No evidence of a lumbosacral radiculopathy was seen.  Jill Alexanders MD 11/07/2018 11:27 AM  [facility] [address] [place], [place] [address]  Phone [phone] Fax [phone]

## 2018-11-07 NOTE — Progress Notes (Addendum)
The patient comes in today for EMG nerve conduction study.  Patient has had some improvement in her foot drop on the left.  She reports no discomfort in the back or pain down the leg.  Nerve conductions showed evidence of slowing in the left peroneal and posterior tibial nerves in isolation, EMG shows acute and chronic denervation in the left peroneal nerve distribution, there may be a suggestion of mild overlying sciatic neuropathy as well.  No evidence of lumbosacral radiculopathy was seen.  The patient will continue conservative management, her foot drop is improving.     [place]    Nerve / Sites Muscle Latency Ref. Amplitude Ref. Rel Amp Segments Distance Velocity Ref. Area    ms ms mV mV %  cm m/s m/s mVms  L Median - APB     Wrist APB 3.7 ?4.4 7.6 ?4.0 100 Wrist - APB 7   22.7     Upper arm APB 7.4  7.2  95 Upper arm - Wrist 18 49 ?49 22.0  L Ulnar - ADM     Wrist ADM 3.2 ?3.3 11.0 ?6.0 100 Wrist - ADM 7   34.7     B.Elbow ADM 6.5  9.7  88.7 B.Elbow - Wrist 16 49 ?49 33.1     A.Elbow ADM 8.5  8.9  90.8 A.Elbow - B.Elbow 10 49 ?49 29.0         A.Elbow - Wrist      R Peroneal - EDB     Ankle EDB 4.7 ?6.5 6.6 ?2.0 100 Ankle - EDB 9   24.9     Fib head EDB 10.6  6.3  95.7 Fib head - Ankle 26 44 ?44 25.1     Pop fossa EDB 12.9  6.4  102 Pop fossa - Fib head 10 44 ?44 25.9         Pop fossa - Ankle      L Peroneal - EDB     Ankle EDB 5.6 ?6.5 2.2 ?2.0 100 Ankle - EDB 9   7.3     Fib head EDB 13.0  1.9  87.6 Fib head - Ankle 26 35 ?44 7.4     Pop fossa EDB 17.4  1.8  97.2 Pop fossa - Fib head 10 23 ?44 7.1         Pop fossa - Ankle      R Tibial - AH     Ankle AH 4.5 ?5.8 13.5 ?4.0 100 Ankle - AH 9   23.7     Pop fossa AH 12.6  10.7  79.6 Pop fossa - Ankle 33 41 ?41 20.4  L Tibial - AH     Ankle AH 3.3 ?5.8 6.5 ?4.0 100 Ankle - AH 9   17.7     Pop fossa AH 12.6  6.2  95.4 Pop fossa - Ankle 33 36 ?41 14.9                      SNC    Nerve / Sites Rec. Site Peak Lat Ref.  Amp Ref.  Segments Distance Peak Diff Ref.    ms ms V V  cm ms ms  R Sural - Ankle (Calf)     Calf Ankle 3.6 ?4.4 10 ?6 Calf - Ankle 14    L Sural - Ankle (Calf)     Calf Ankle 4.2 ?4.4 8 ?6 Calf - Ankle 14    R Superficial peroneal - Ankle  Lat leg Ankle 3.1 ?4.4 3 ?6 Lat leg - Ankle 14    L Superficial peroneal - Ankle     Lat leg Ankle 4.6 ?4.4 2 ?6 Lat leg - Ankle 14    L Median, Ulnar - Transcarpal comparison     Median Palm Wrist 2.2 ?2.2 51 ?35 Median Palm - Wrist 8       Ulnar Palm Wrist 2.2 ?2.2 20 ?12 Ulnar Palm - Wrist 8          Median Palm - Ulnar Palm  0.1 ?0.4  L Median - Orthodromic (Dig II, Mid palm)     Dig II Wrist 3.4 ?3.4 12 ?10 Dig II - Wrist 13    L Ulnar - Orthodromic, (Dig V, Mid palm)     Dig V Wrist 2.9 ?3.1 3 ?5 Dig V - Wrist 66                     F  Wave    Nerve F Lat Ref.   ms ms  R Tibial - AH 52.3 ?56.0  L Ulnar - ADM 28.5 ?32.0  L Tibial - AH 52.7 ?56.0

## 2018-11-08 ENCOUNTER — Other Ambulatory Visit: Payer: Self-pay

## 2018-11-08 ENCOUNTER — Ambulatory Visit (INDEPENDENT_AMBULATORY_CARE_PROVIDER_SITE_OTHER): Payer: Medicare Other | Admitting: Thoracic Surgery (Cardiothoracic Vascular Surgery)

## 2018-11-08 ENCOUNTER — Encounter: Payer: Self-pay | Admitting: Thoracic Surgery (Cardiothoracic Vascular Surgery)

## 2018-11-08 VITALS — BP 104/67 | HR 57 | Temp 97.7°F | Resp 16 | Ht 61.0 in | Wt 145.0 lb

## 2018-11-08 DIAGNOSIS — Z902 Acquired absence of lung [part of]: Secondary | ICD-10-CM | POA: Diagnosis not present

## 2018-11-08 DIAGNOSIS — I2510 Atherosclerotic heart disease of native coronary artery without angina pectoris: Secondary | ICD-10-CM

## 2018-11-08 DIAGNOSIS — C3431 Malignant neoplasm of lower lobe, right bronchus or lung: Secondary | ICD-10-CM

## 2018-11-08 NOTE — Progress Notes (Signed)
[address]       [place] [address]             [phone]     HPI: Mrs. Maria Arroyo returns for scheduled follow-up visit  Maria Arroyo is a 68 year old woman with a history of tobacco abuse, COPD, CAD, hypertension, hyperlipidemia, osteoarthritis, peripheral neuropathy, chronic pain, gout, depression, and left foot drop.  She had a stage Ib squamous cell carcinoma removed with a thoracoscopic right lower lobectomy in January 2020.  She developed a pleural effusion postoperatively.  She had a thoracentesis which drained about 1.2 L of fluid in March.  Cytology was negative.  She was treated with a steroid taper.  Follow-up was delayed by COVID.  I saw her in May.  She still had some pleural effusion but her respiratory status is stable.  She saw Dr. Julien Nordmann couple of weeks ago.  She had no evidence of recurrent disease.  He plans to see her back with a repeat CT in 6 months.  She was evaluated by Dr. Jannifer Franklin for a new onset left foot drop.  That is improving.  She denies peripheral edema.  Her respiratory status is stable.  She does not have any residual pain related to her incisions.  Past Medical History:  Diagnosis Date  . Cancer ([place])    lung  . Cervical disc disease   . 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 COPD (chronic obstructive pulmonary disease) ([place])   . Coronary artery disease   . Depression   . Gout    R foot and knee  . Hyperlipidemia   . Hypertension   . Left foot drop   . Neuropathy   . OA (osteoarthritis)   . Pica   . Tobacco abuse     Current Outpatient Medications  Medication Sig Dispense Refill  . acetaminophen (TYLENOL) 500 MG tablet Take 2 tablets (1,000 mg total) by mouth every 6 (six) hours. 30 tablet 0  . allopurinol (ZYLOPRIM) 100 MG tablet Take 100 mg by mouth daily.     [name] amiodarone (PACERONE) 200 MG tablet Take 2 tabs (400 mg)  twice daily for 14 days, then 1 tab (200 mg) daily thereafter.  (Patient taking differently: Take 200 mg by mouth daily. ) 90 tablet 1  . apixaban (ELIQUIS) 5 MG TABS tablet Take 1 tablet (5 mg total) by mouth 2 (two) times daily. 60 tablet 1  . aspirin 81 MG tablet Take 81 mg by mouth daily.    . bisoprolol (ZEBETA) 5 MG tablet Take 0.5 tablets (2.5 mg total) by mouth daily. 30 tablet 11  . busPIRone (BUSPAR) 15 MG tablet Take 15 mg by mouth 2 (two) times daily.    . Calcium Carbonate-Vitamin D (CALCIUM 600 + D PO) Take 1 tablet by mouth daily.     . carisoprodol (SOMA) 350 MG tablet Take 1 tablet by mouth 4 (four) times daily as needed for muscle spasms.    . Cholecalciferol (VITAMIN D) 2000 UNITS tablet Take 4,000 Units by mouth daily.     . furosemide (LASIX) 20 MG tablet TAKE 2 TABLETS(40 MG) BY MOUTH DAILY (Patient taking differently: Take 40 mg by mouth daily. ) 180 tablet 0  . gabapentin (NEURONTIN) 600 MG tablet TAKE 1 TABLET(600 MG) BY MOUTH FOUR TIMES DAILY AT 8 AM AND AT 1 PM AND AT 6 PM AND AT 10 PM (Patient taking differently: Take 600 mg by mouth 4 (four)  times daily. ) 120 tablet 11  . hydrochlorothiazide (MICROZIDE) 12.5 MG capsule Take 1 capsule (12.5 mg total) by mouth daily. 45 capsule 0  . Multiple Vitamin (MULTIVITAMIN WITH MINERALS) TABS tablet Take 1 tablet by mouth daily. 30 tablet 0  . nicotine (NICODERM CQ - DOSED IN MG/24 HOURS) 21 mg/24hr patch Place 1 patch (21 mg total) onto the skin daily. 28 patch 0  . nitroGLYCERIN (NITROSTAT) 0.4 MG SL tablet Place 1 tablet (0.4 mg total) under the tongue every 5 (five) minutes as needed. For chest pain (Patient taking differently: Place 0.4 mg under the tongue every 5 (five) minutes as needed for chest pain. ) 100 tablet 0  . oxyCODONE-acetaminophen (PERCOCET) 10-325 MG tablet Take 1 tablet by mouth 4 (four) times daily as needed for pain.    [name] PROVENTIL HFA 108 (90 Base) MCG/ACT inhaler Inhale 1-2 puffs into the lungs every 4 (four) hours as needed for wheezing or shortness of breath.   4  .  rosuvastatin (CRESTOR) 40 MG tablet Take 1 tablet (40 mg total) by mouth daily. 90 tablet 3  . venlafaxine XR (EFFEXOR-XR) 75 MG 24 hr capsule Take 75 mg by mouth 2 (two) times daily.   0   No current facility-administered medications for this visit.     Physical Exam BP 104/67 (BP Location: Right Arm, Patient Position: Sitting, Cuff Size: Normal)   Pulse (!) 57   Temp 97.7 F (36.5 C)   Resp 16   Ht 5\' 1"  (1.549 m)   Wt 145 lb (65.8 kg)   SpO2 92% Comment: RA  BMI 27.17 kg/m  68 year old woman in no acute distress Alert and oriented x3 with 4 out of 5 dorsiflexion left foot, no other deficits No cervical supraclavicular adenopathy Lungs diminished at right base, otherwise clear Cardiac regular rate and rhythm No peripheral edema  Diagnostic Tests: CT CHEST WITH CONTRAST  TECHNIQUE: Multidetector CT imaging of the chest was performed during intravenous contrast administration.  CONTRAST:  13mL OMNIPAQUE IOHEXOL 300 MG/ML  SOLN  COMPARISON:  05/27/2018.  FINDINGS: Cardiovascular: Atherosclerotic calcification of the aorta, aortic valve and coronary arteries. Pulmonic trunk is enlarged. Heart is at the upper limits of normal in size. No pericardial effusion.  Mediastinum/Nodes: No pathologically enlarged mediastinal, hilar or axillary lymph nodes. Esophagus is grossly unremarkable.  Lungs/Pleura: Centrilobular and paraseptal emphysema. Right lower lobectomy. Scattered pulmonary nodules measure up to 4 mm in the posterior left upper lobe (series 7, image 57), stable. Large right pleural effusion is unchanged. Airway is otherwise unremarkable.  Upper Abdomen: Visualized portion of the liver is unremarkable. Bilateral adrenal masses measure 1.9 cm on the right and 2.5 cm on the left, similar. Probable early excretion of contrast in the right kidney rather than stones. Left kidney is atrophic and contains a partially imaged fluid density lesion off the upper pole,  measuring 1.7 cm. Visualized portions of the spleen, pancreas, stomach and bowel are grossly unremarkable. No upper abdominal adenopathy.  Musculoskeletal: Degenerative changes in the spine. No worrisome lytic or sclerotic lesions.  IMPRESSION: 1. Large right pleural effusion, stable 2. Small bilateral pulmonary nodules, stable. 3. Indeterminate bilateral adrenal nodules, stable. 4. Aortic atherosclerosis (ICD10-170.0). Coronary artery calcification. 5. Enlarged pulmonic trunk, indicative of pulmonary arterial hypertension. 6.  Emphysema (ICD10-J43.9).   Electronically Signed   By: Lorin Picket M.D.   On: 10/24/2018 15:09 I personally reviewed the CT images and concur with the findings noted above  Impression: Maria Arroyo is a 68 year old  former smoker who had a right lower lobectomy for a stage Ib squamous cell carcinoma 6 months ago.  She is doing well at this time from a surgical standpoint with no residual pain.  She does have some pleural effusion on the right.  This likely is feeling space left behind by her lobectomy.  She had a thoracentesis previously but the fluid came right back.  It did not respond significantly to a steroid taper or diuretics.  By the same token the effusion has not worsened over several months now and is not causing her any significant issues.  She will continue to be followed by Dr. Julien Nordmann.  He plans CT scans every 6 months for the first 2 years and then annually after that.   Plan:  Continue to follow-up with Dr. Julien Nordmann I will be happy to see her back anytime in the future if I can be of any further assistance with her care  Melrose Nakayama, MD [facility] [phone]

## 2018-11-30 ENCOUNTER — Ambulatory Visit (INDEPENDENT_AMBULATORY_CARE_PROVIDER_SITE_OTHER): Payer: Medicare Other | Admitting: Neurology

## 2018-11-30 ENCOUNTER — Encounter: Payer: Self-pay | Admitting: Neurology

## 2018-11-30 ENCOUNTER — Other Ambulatory Visit: Payer: Self-pay

## 2018-11-30 VITALS — BP 138/79 | HR 53 | Temp 97.5°F | Ht 61.0 in | Wt 144.2 lb

## 2018-11-30 DIAGNOSIS — I2510 Atherosclerotic heart disease of native coronary artery without angina pectoris: Secondary | ICD-10-CM

## 2018-11-30 DIAGNOSIS — M4712 Other spondylosis with myelopathy, cervical region: Secondary | ICD-10-CM | POA: Diagnosis not present

## 2018-11-30 DIAGNOSIS — R269 Unspecified abnormalities of gait and mobility: Secondary | ICD-10-CM | POA: Diagnosis not present

## 2018-11-30 DIAGNOSIS — G5702 Lesion of sciatic nerve, left lower limb: Secondary | ICD-10-CM | POA: Diagnosis not present

## 2018-11-30 DIAGNOSIS — M5432 Sciatica, left side: Secondary | ICD-10-CM

## 2018-11-30 MED ORDER — PREDNISONE 5 MG PO TABS: ORAL_TABLET | ORAL | 0 refills | Status: DC

## 2018-11-30 NOTE — Progress Notes (Signed)
Reason for visit: Left foot drop, cervical myelopathy, low back pain  Maria Arroyo is an 68 y.o. female  History of present illness:  Maria Arroyo is a 68 year old left-handed white female with a history of lung cancer and a history of chronic low back pain.  The patient has had lumbosacral spine surgery in the past, but she has continued to have some discomfort.  Within the last 2 weeks, she has had a dramatic increase in her back pain with pain down the left leg.  She is having tingling down the lateral aspect of the left leg.  She had a foot drop, prior EMG and nerve conduction study confirmed the peroneal neuropathy but there was some suggestion of an overlying sciatic neuropathy as well.  The foot drop has improved, but her sciatica pain has worsened significantly.  The patient claims that her diabetes is under good control, she has been able to come off of metformin.  She returns to the office today for an evaluation.  The pain is mainly in the left hip area and back, she is unable to lay on the left side.  She does have opiate medications to take for pain.  Past Medical History:  Diagnosis Date  . Cancer ([place])    lung  . Cervical disc disease   . 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 COPD (chronic obstructive pulmonary disease) ([place])   . Coronary artery disease   . Depression   . Gout    R foot and knee  . Hyperlipidemia   . Hypertension   . Left foot drop   . Neuropathy   . OA (osteoarthritis)   . Pica   . Tobacco abuse     Past Surgical History:  Procedure Laterality Date  . ABDOMINAL HYSTERECTOMY    . ANTERIOR CERVICAL DECOMP/DISCECTOMY FUSION  02/06/2011   Procedure: ANTERIOR CERVICAL DECOMPRESSION/DISCECTOMY FUSION 2 LEVELS;  Surgeon: Winfield Cunas;  Location: [facility];  Service: Neurosurgery;  Laterality: N/A;  Anterior Cervical Four-Five/Five-Six Decompression and Fusion with Plating and Bonegraft  . BLADDER SURGERY    .  CARDIAC CATHETERIZATION  04/09/2009   EF 60%  . CARDIAC CATHETERIZATION  03/24/2001   EF 55%  . CARDIAC CATHETERIZATION  01/06/2001   EF 65%  . CHOLECYSTECTOMY    . CORONARY STENT PLACEMENT  03/2009   STENTING OF THE PROXIMAL TO MID RIGHT CORONARY  . IR THORACENTESIS ASP PLEURAL SPACE W/IMG GUIDE  06/01/2018  . LEFT HEART CATHETERIZATION WITH CORONARY ANGIOGRAM N/A 12/21/2011   Procedure: LEFT HEART CATHETERIZATION WITH CORONARY ANGIOGRAM;  Surgeon: Peter M Martinique, MD;  Location: [facility] CATH LAB;  Service: Cardiovascular;  Laterality: N/A;  . LOBECTOMY    . NODE DISSECTION N/A 04/04/2018   Procedure: NODE DISSECTION;  Surgeon: Melrose Nakayama, MD;  Location: [facility];  Service: Thoracic;  Laterality: N/A;  . OTHER SURGICAL HISTORY  12/2014   R foot cyst removal  . VIDEO ASSISTED THORACOSCOPY (VATS)/ LOBECTOMY Right 04/04/2018   Procedure: VIDEO ASSISTED THORACOSCOPY (VATS)/RIGHT LOWER LOBECTOMY;  Surgeon: Melrose Nakayama, MD;  Location: [facility];  Service: Thoracic;  Laterality: Right;    Family History  Problem Relation Age of Onset  . Aneurysm Mother   . Heart attack Father   . Heart failure Father   . Stroke Sister   . Heart attack Brother     Social history:  reports that she has been smoking cigarettes. She has a 70.00 pack-year  smoking history. She has never used smokeless tobacco. She reports that she does not drink alcohol or use drugs.    Allergies  Allergen Reactions  . Ace Inhibitors Other (See Comments)    Worsening renal function   . Codeine Nausea Only  . Codeine-Guaifenesin [Guaifenesin-Codeine] Nausea Only and Other (See Comments)    Some nausea--but ok with percocet    Medications:  Prior to Admission medications   Medication Sig Start Date End Date Taking? Authorizing Provider  acetaminophen (TYLENOL) 500 MG tablet Take 2 tablets (1,000 mg total) by mouth every 6 (six) hours. 04/09/18  Yes Conte, Tessa N, PA-C  allopurinol (ZYLOPRIM) 100 MG tablet Take 100 mg  by mouth daily.    Yes [provider]  amiodarone (PACERONE) 200 MG tablet Take 2 tabs (400 mg)  twice daily for 14 days, then 1 tab (200 mg) daily thereafter. Patient taking differently: Take 200 mg by mouth daily.  04/09/18  Yes Conte, Tessa N, PA-C  apixaban (ELIQUIS) 5 MG TABS tablet Take 1 tablet (5 mg total) by mouth 2 (two) times daily. 04/09/18  Yes Elgie Collard, PA-C  aspirin 81 MG tablet Take 81 mg by mouth daily.   Yes [provider]  bisoprolol (ZEBETA) 5 MG tablet Take 0.5 tablets (2.5 mg total) by mouth daily. 04/22/18  Yes Almyra Deforest, PA  busPIRone (BUSPAR) 15 MG tablet Take 15 mg by mouth 2 (two) times daily.   Yes [provider]  Calcium Carbonate-Vitamin D (CALCIUM 600 + D PO) Take 1 tablet by mouth daily.    Yes [provider]  carisoprodol (SOMA) 350 MG tablet Take 1 tablet by mouth 4 (four) times daily as needed for muscle spasms. 04/15/18  Yes [provider]  Cholecalciferol (VITAMIN D) 2000 UNITS tablet Take 4,000 Units by mouth daily.    Yes [provider]  furosemide (LASIX) 20 MG tablet TAKE 2 TABLETS(40 MG) BY MOUTH DAILY Patient taking differently: Take 40 mg by mouth daily.  08/09/18  Yes Melrose Nakayama, MD  gabapentin (NEURONTIN) 600 MG tablet TAKE 1 TABLET(600 MG) BY MOUTH FOUR TIMES DAILY AT 8 AM AND AT 1 PM AND AT 6 PM AND AT 10 PM Patient taking differently: Take 600 mg by mouth 4 (four) times daily.  04/25/18  Yes Dennie Bible, NP  hydrochlorothiazide (MICROZIDE) 12.5 MG capsule Take 1 capsule (12.5 mg total) by mouth daily. 08/30/18  Yes Oretha Milch D, MD  Multiple Vitamin (MULTIVITAMIN WITH MINERALS) TABS tablet Take 1 tablet by mouth daily. 08/30/18  Yes Oretha Milch D, MD  nicotine (NICODERM CQ - DOSED IN MG/24 HOURS) 21 mg/24hr patch Place 1 patch (21 mg total) onto the skin daily. 08/30/18  Yes Oretha Milch D, MD  nitroGLYCERIN (NITROSTAT) 0.4 MG SL tablet Place 1 tablet (0.4 mg total)  under the tongue every 5 (five) minutes as needed. For chest pain Patient taking differently: Place 0.4 mg under the tongue every 5 (five) minutes as needed for chest pain.  08/03/16  Yes Martinique, Peter M, MD  oxyCODONE-acetaminophen (PERCOCET) 10-325 MG tablet Take 1 tablet by mouth 4 (four) times daily as needed for pain. 04/13/18  Yes [provider]  PROVENTIL HFA 108 (90 Base) MCG/ACT inhaler Inhale 1-2 puffs into the lungs every 4 (four) hours as needed for wheezing or shortness of breath.  03/26/16  Yes [provider]  venlafaxine XR (EFFEXOR-XR) 75 MG 24 hr capsule Take 75 mg by mouth 2 (two)  times daily.    Yes [provider]  rosuvastatin (CRESTOR) 40 MG tablet Take 1 tablet (40 mg total) by mouth daily. 04/29/18 11/08/18  Martinique, Peter M, MD    ROS:  Out of a complete 14 system review of symptoms, the patient complains only of the following symptoms, and all other reviewed systems are negative.  Low back pain, left leg pain Gait problems  Blood pressure 138/79, pulse (!) 53, temperature (!) 97.5 F (36.4 C), temperature source Temporal, height 5\' 1"  (1.549 m), weight 144 lb 4 oz (65.4 kg).  Physical Exam  General: The patient is alert and cooperative at the time of the examination.  Skin: 1+ edema below the knees is seen bilaterally.   Neurologic Exam  Mental status: The patient is alert and oriented x 3 at the time of the examination. The patient has apparent normal recent and remote memory, with an apparently normal attention span and concentration ability.   Cranial nerves: Facial symmetry is present. Speech is normal, no aphasia or dysarthria is noted. Extraocular movements are full. Visual fields are full.  Motor: The patient has good strength in all 4 extremities, with exception of a mild left foot drop.  Sensory examination: Soft touch sensation is symmetric on the face, arms, and legs.  Coordination: The patient has good finger-nose-finger  and heel-to-shin bilaterally.  Gait and station: The patient has a limping type gait on the left leg.  Tandem gait is unsteady.  Romberg is negative, but is unsteady.  Reflexes: Deep tendon reflexes are symmetric.   Assessment/Plan:  1.  Left foot drop, improving  2.  Acute on chronic low back pain, left-sided sciatica  3.  Cervical myelopathy  4.  Gait disorder  The patient is had a significant exacerbation of her back and left leg pain.  She will be given a prescription for prednisone with a taper over 6 days.  She can take opiate medications for pain.  We will set her up for MRI of the low back, the patient does have a history of cancer.  The patient will follow-up in 4 months.  Jill Alexanders MD 11/30/2018 9:55 AM  [facility] [address] [place], [place] [address]  Phone [phone] Fax [phone]

## 2018-12-06 ENCOUNTER — Other Ambulatory Visit: Payer: Medicare Other

## 2018-12-22 NOTE — Progress Notes (Signed)
Cardiology Office Note    Date:  12/26/2018   ID:  Maria Arroyo, DOB 15-Jan-1951, MRN [id number]  PCP:  [name], MD  Cardiologist:  Dr. Martinique  Chief Complaint  Patient presents with   Atrial Fibrillation    History of Present Illness:  Maria Arroyo is a 68 y.o. female with PMH of HTN, HLD, COPD, tobacco abuse, lung nodule and CAD.  Patient underwent stenting of mid RCA in December 1999.  She returned in October 2002 and had stenting of proximal RCA.  In January 2011, she developed a high-grade stenosis in between the prior stents and that this was stented with additional drug-eluting stent.  She required repeat cardiac catheterization in September 2013, this revealed nonobstructive CAD.  Since then, she had been maintained on dual antiplatelet therapy.  Earlier this year due to significant unexplained weight loss, she underwent work-up for occult malignancy.  Chest x-ray showed a question of right lower lobe lung nodule.  PET scan showed hypermetabolic mass in RLL.  Patient was referred to Dr. Roxan Hockey for further evaluation.  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  Her Plavix was held prior to the procedure.  Postprocedure, her hospital course was complicated by occurrence of atrial fibrillation requiring IV amiodarone.    She was initially placed on amiodarone and converted to sinus rhythm.  However later had recurrence of atrial fibrillation during the same hospitalization.  She was started on Eliquis and discharged with downward titration of amiodarone.    She was seen by Almyra Deforest PA-C in January. Was on amiodarone 200 mg daily. Bisoprolol was reduced due to bradycardia. She has been on Eliquis for anticoagulation.   She was admitted in late May with acute respiratory failure with hypoxemia. Worsening right pleural effusion. Anemia with Hgb to 6.7. given iron and transfusion PRBC x 2.  Diuresed with IV lasix and DC on lasix and HCTZ. On home oxygen. Echo done as noted below. BNP 1200.  On follow up today she reports her breathing is OK. No significant cough. No chest pain. No edema. Last Hgb up to 10.9. Still smoking 1 ppd. Weight has been stable. Appetite is good. She does worry a lot.     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 11/07/2018   COPD (chronic obstructive pulmonary disease) (HCC)    Coronary artery disease    Depression    Gout    R foot and knee   Hyperlipidemia    Hypertension    Left foot drop    Neuropathy    OA (osteoarthritis)    [place]    Tobacco abuse     Past Surgical History:  Procedure Laterality Date   ABDOMINAL HYSTERECTOMY     ANTERIOR CERVICAL DECOMP/DISCECTOMY FUSION  02/06/2011   Procedure: ANTERIOR CERVICAL DECOMPRESSION/DISCECTOMY FUSION 2 LEVELS;  Surgeon: Winfield Cunas;  Location: [place] [facility];  Service: Neurosurgery;  Laterality: N/A;  Anterior Cervical Four-Five/Five-Six Decompression and Fusion with Plating and Bonegraft   BLADDER SURGERY     CARDIAC CATHETERIZATION  04/09/2009   EF 60%   CARDIAC CATHETERIZATION  03/24/2001   EF 55%   CARDIAC CATHETERIZATION  01/06/2001   EF 65%   CHOLECYSTECTOMY     CORONARY STENT PLACEMENT  03/2009   STENTING OF THE PROXIMAL TO MID  RIGHT CORONARY   IR THORACENTESIS ASP PLEURAL SPACE W/IMG GUIDE  06/01/2018   LEFT HEART CATHETERIZATION WITH CORONARY ANGIOGRAM N/A 12/21/2011   Procedure: LEFT HEART CATHETERIZATION WITH CORONARY ANGIOGRAM;  Surgeon: Madelynn Malson M Martinique, MD;  Location[facility] C[facility] LAB;  Service: Cardiovascular;  Laterality: N/A;   LOBECTOMY     NODE DISSECTION N/A 04/04/2018   Procedure: NODE DISSECTION;  Surgeon: Melrose Nakayama, MD;  Location: [place];  Service: Thoracic;  Laterality: N/A;   OTHER SURGICAL HISTORY  12/2014   R foot cyst removal   VIDEO  ASSISTED THORACOSCOPY (VATS)/ LOBECTOMY Right 04/04/2018   Procedure: VIDEO ASSISTED THORACOSCOPY (VATS)/RIGHT LOWER LOBECTOMY;  Surgeon: Melrose Nakayama, MD;  Location: [place];  Service: Thoracic;  Laterality: Right;    Current Medications: Outpatient Medications Prior to Visit  Medication Sig Dispense Refill   acetaminophen (TYLENOL) 500 MG tablet Take 2 tablets (1,000 mg total) by mouth every 6 (six) hours. 30 tablet 0   allopurinol (ZYLOPRIM) 100 MG tablet Take 100 mg by mouth daily.      apixaban (ELIQUIS) 5 MG TABS tablet Take 1 tablet (5 mg total) by mouth 2 (two) times daily. 60 tablet 1   bisoprolol (ZEBETA) 5 MG tablet Take 0.5 tablets (2.5 mg total) by mouth daily. 30 tablet 11   busPIRone (BUSPAR) 15 MG tablet Take 15 mg by mouth 2 (two) times daily.     Calcium Carbonate-Vitamin D (CALCIUM 600 + D PO) Take 1 tablet by mouth daily.      carisoprodol (SOMA) 350 MG tablet Take 1 tablet by mouth 4 (four) times daily as needed for muscle spasms.     Cholecalciferol (VITAMIN D) 2000 UNITS tablet Take 4,000 Units by mouth daily.      furosemide (LASIX) 20 MG tablet TAKE 2 TABLETS(40 MG) BY MOUTH DAILY (Patient taking differently: Take 40 mg by mouth daily. ) 180 tablet 0   gabapentin (NEURONTIN) 600 MG tablet TAKE 1 TABLET(600 MG) BY MOUTH FOUR TIMES DAILY AT 8 AM AND AT 1 PM AND AT 6 PM AND AT 10 PM (Patient taking differently: Take 600 mg by mouth 4 (four) times daily. ) 120 tablet 11   hydrochlorothiazide (MICROZIDE) 12.5 MG capsule Take 1 capsule (12.5 mg total) by mouth daily. 45 capsule 0   Multiple Vitamin (MULTIVITAMIN WITH MINERALS) TABS tablet Take 1 tablet by mouth daily. 30 tablet 0   nicotine (NICODERM CQ - DOSED IN MG/24 HOURS) 21 mg/24hr patch Place 1 patch (21 mg total) onto the skin daily. 28 patch 0   nitroGLYCERIN (NITROSTAT) 0.4 MG SL tablet Place 1 tablet (0.4 mg total) under the tongue every 5 (five) minutes as needed. For chest pain (Patient taking  differently: Place 0.4 mg under the tongue every 5 (five) minutes as needed for chest pain. ) 100 tablet 0   oxyCODONE-acetaminophen (PERCOCET) 10-325 MG tablet Take 1 tablet by mouth 4 (four) times daily as needed for pain.     PROVENTIL HFA 108 (90 Base) MCG/ACT inhaler Inhale 1-2 puffs into the lungs every 4 (four) hours as needed for wheezing or shortness of breath.   4   venlafaxine XR (EFFEXOR-XR) 75 MG 24 hr capsule Take 75 mg by mouth 2 (two) times daily.   0   amiodarone (PACERONE) 200 MG tablet Take 2 tabs (400 mg)  twice daily for 14 days, then 1 tab (200 mg) daily thereafter. (Patient taking differently: Take 200 mg by mouth daily. ) 90 tablet 1  aspirin 81 MG tablet Take 81 mg by mouth daily.     rosuvastatin (CRESTOR) 40 MG tablet Take 1 tablet (40 mg total) by mouth daily. 90 tablet 3   predniSONE (DELTASONE) 5 MG tablet Begin taking 6 tablets daily, taper by one tablet daily until off the medication. (Patient not taking: Reported on 12/26/2018) 21 tablet 0   No facility-administered medications prior to visit.      Allergies:   Ace inhibitors, Codeine, and Codeine-guaifenesin [guaifenesin-codeine]   Social History   Socioeconomic History   Marital status: Married    Spouse name: Not on file   Number of children: 2   Years of education: 10   Highest education level: Not on file  Occupational History   Occupation: housewife    Fish farm manager: UNEMPLOYED  Social Designer, fashion/clothing strain: Not on file   Food insecurity    Worry: Not on file    Inability: Not on file   Transportation needs    Medical: Not on file    Non-medical: Not on file  Tobacco Use   Smoking status: Current Every Day Smoker    Packs/day: 2.00    Years: 35.00    Pack years: 70.00    Types: Cigarettes   Smokeless tobacco: Never Used   Tobacco comment: 1-1.5 ppd (10/02/13), 03/17/18 2 PPD  Substance and Sexual Activity   Alcohol use: No    Alcohol/week: 0.0 standard drinks     Drug use: No   Sexual activity: Not on file  Lifestyle   Physical activity    Days per week: Not on file    Minutes per session: Not on file   Stress: Not on file  Relationships   Social connections    Talks on phone: Not on file    Gets together: Not on file    Attends religious service: Not on file    Active member of club or organization: Not on file    Attends meetings of clubs or organizations: Not on file    Relationship status: Not on file  Other Topics Concern   Not on file  Social History Narrative   The patient is married ([place]) and lives with her husband.   The patient has a 10th grade education.   The patient is left-handed.   The patient has two children.   Patient drinks three cups of soda daily.         Family History:  The patient's family history includes Aneurysm in her mother; Heart attack in her brother and father; Heart failure in her father; Stroke in her sister.   ROS:   Please see the history of present illness.    ROS All other systems reviewed and are negative.   PHYSICAL EXAM:   VS:  BP 140/79    Pulse (!) 51    Temp (!) 97 F (36.1 C)    Ht 5\' 1"  (1.549 m)    Wt 143 lb (64.9 kg)    SpO2 96%    BMI 27.02 kg/m    GEN: Well nourished, well developed, in no acute distress  HEENT: normal  Neck: no JVD, carotid bruits, or masses Cardiac: RRR; no murmurs, rubs, or gallops,no edema  Respiratory: Decreased breath sounds in right base GI: soft, nontender, nondistended, + BS MS: no deformity or atrophy  Skin: warm and dry, no rash Neuro:  Alert and Oriented x 3, Strength and sensation are intact Psych: euthymic mood, full affect  Wt Readings  from Last 3 Encounters:  12/26/18 143 lb (64.9 kg)  11/30/18 144 lb 4 oz (65.4 kg)  11/08/18 145 lb (65.8 kg)      Studies/Labs Reviewed:   EKG:  EKG is not ordered today.    Recent Labs: 04/08/2018: Magnesium 2.1; TSH 0.633 08/26/2018: B Natriuretic Peptide 1,280.5 10/24/2018: ALT 13; BUN 25;  Creatinine 1.38; Hemoglobin 10.9; Platelet Count 248; Potassium 3.3; Sodium 133   Dated 09/20/18: Hgb 9.8. creatinine 1.37.   Lipid Panel No results found for: CHOL, TRIG, HDL, CHOLHDL, VLDL, LDLCALC, LDLDIRECT  Additional studies/ records that were reviewed today include:   Echo 04/07/2018 LV EF: 50% -   55%  ------------------------------------------------------------------- Indications:      Atrial fibrillation - 427.31.  ------------------------------------------------------------------- Study Conclusions  - Left ventricle: EF hard to judge due to rapid afib. The cavity   size was normal. Wall thickness was normal. Systolic function was   normal. The estimated ejection fraction was in the range of 50%   to 55%. The study is not technically sufficient to allow   evaluation of LV diastolic function. - Aortic valve: Sclerosis without stenosis. Valve area (VTI): 2.07   cm^2. Valve area (Vmean): 2.26 cm^2. - Right ventricle: The cavity size was mildly dilated.   Echo 07/31/18: IMPRESSIONS    1. The left ventricle has normal systolic function, with an ejection fraction of 55-60%. The cavity size was mildly dilated. Left ventricular diastolic Doppler parameters are consistent with impaired relaxation.  2. The right ventricle has moderately reduced systolic function. The cavity was mildly enlarged. There is no increase in right ventricular wall thickness.  3. Poor acoustic windows limit study of RV.  4. Right atrial size was moderately dilated.  5. The mitral valve is abnormal. Mild thickening of the mitral valve leaflet. Mild calcification of the mitral valve leaflet. Mitral valve regurgitation is mild to moderate by color flow Doppler.  6. MR is eccentric, directed posterior into LA Mild to moderate in severity.  7. The aortic valve is abnormal. Mild thickening of the aortic valve. Moderate calcification of the aortic valve.  8. The interatrial septum was not  assessed.   ASSESSMENT:    1. Coronary artery disease involving native coronary artery of native heart without angina pectoris   2. PAF (paroxysmal atrial fibrillation) ([place])   3. Essential hypertension   4. Hyperlipidemia, unspecified hyperlipidemia type   5. Tobacco abuse      PLAN:  In order of problems listed above:  1. CAD s/p multiple prior stents of RCA. She is asymptomatic. Since she is on Eliquis I have recommended stopping ASA especially since she is a high bleeding risk. Continue beta blocker.   2. PAF: On Eliquis.  Maintaining NSR on amiodarone. Now on 200 mg daily. To have lab work with Dr Melford Aase tomorrow. Will make sure TSH and LFTs done.   3. HTN. Controlled.  4. HLD. On crestor. Will check fasting lipid with [facility] tomorrow.   5. Lung CAD s/p VATS with persistent right pleural effusion  6. COPD. Strongly advised smoking cessation.  7. Chronic diastolic CHF. Does not appear volume overloaded today.     Medication Adjustments/Labs and Tests Ordered: Current medicines are reviewed at length with the patient today.  Concerns regarding medicines are outlined above.  Medication changes, Labs and Tests ordered today are listed in the Patient Instructions below. Patient Instructions  Stop  Smoking  We will have Dr Melford Aase check your lipids, liver tests, and thyroid  Stop  ASA  Follow up in 6 months.       Signed, Ibrahem Volkman Martinique, MD  12/26/2018 3:35 PM    [facility] [place], [place], [place]  [address] Phone: [phone]; Fax: [phone]

## 2018-12-26 ENCOUNTER — Other Ambulatory Visit: Payer: Self-pay

## 2018-12-26 ENCOUNTER — Encounter: Payer: Self-pay | Admitting: Cardiology

## 2018-12-26 ENCOUNTER — Ambulatory Visit (INDEPENDENT_AMBULATORY_CARE_PROVIDER_SITE_OTHER): Payer: Medicare Other | Admitting: Cardiology

## 2018-12-26 VITALS — BP 140/79 | HR 51 | Temp 97.0°F | Ht 61.0 in | Wt 143.0 lb

## 2018-12-26 DIAGNOSIS — I2510 Atherosclerotic heart disease of native coronary artery without angina pectoris: Secondary | ICD-10-CM

## 2018-12-26 DIAGNOSIS — I480 Paroxysmal atrial fibrillation: Secondary | ICD-10-CM | POA: Diagnosis not present

## 2018-12-26 DIAGNOSIS — E785 Hyperlipidemia, unspecified: Secondary | ICD-10-CM

## 2018-12-26 DIAGNOSIS — I10 Essential (primary) hypertension: Secondary | ICD-10-CM | POA: Diagnosis not present

## 2018-12-26 DIAGNOSIS — Z720 Tobacco use: Secondary | ICD-10-CM

## 2018-12-26 MED ORDER — AMIODARONE HCL 200 MG PO TABS: 200.0000 mg | ORAL_TABLET | Freq: Every day | ORAL | 3 refills | Status: DC

## 2018-12-26 NOTE — Patient Instructions (Addendum)
Stop  Smoking  We will have Dr Melford Aase check your lipids, liver tests, and thyroid  Stop ASA  Follow up in 6 months.

## 2018-12-29 ENCOUNTER — Telehealth: Payer: Self-pay | Admitting: Neurology

## 2018-12-29 ENCOUNTER — Ambulatory Visit
Admission: RE | Admit: 2018-12-29 | Discharge: 2018-12-29 | Disposition: A | Discharge status: Home / Self Care | Payer: Medicare Other | Source: Ambulatory Visit | Attending: Neurology | Admitting: Neurology

## 2018-12-29 ENCOUNTER — Other Ambulatory Visit: Payer: Self-pay | Admitting: Neurology

## 2018-12-29 ENCOUNTER — Other Ambulatory Visit: Payer: Self-pay

## 2018-12-29 ENCOUNTER — Inpatient Hospital Stay: Admission: RE | Admit: 2018-12-29 | Payer: Medicare Other | Source: Ambulatory Visit

## 2018-12-29 DIAGNOSIS — M5432 Sciatica, left side: Secondary | ICD-10-CM

## 2018-12-29 DIAGNOSIS — M48062 Spinal stenosis, lumbar region with neurogenic claudication: Secondary | ICD-10-CM

## 2018-12-29 NOTE — Telephone Encounter (Signed)
I called the patient.  MRI shows significant spinal stenosis at the L4-5, severe.  The patient is having ongoing back pain, she did not get benefit with the prednisone Dosepak, she does have Percocet to take.  I will make a referral to neurosurgery.  She does have a abdominal aortic aneurysm that will need to be followed over time.  MRI lumbar 12/29/18:  IMPRESSION: 3.2 cm abdominal aortic aneurysm is new since the prior exam. Recommend followup by ultrasound in 3 years. This recommendation follows ACR consensus guidelines: White Paper of the ACR Incidental Findings Committee II on Vascular Findings. J Am Coll Radiol 2013; 10:789-794. Aortic aneurysm NOS (ICD10-I71.9).  Marked degenerative endplate signal change in the lower thoracic and upper lumbar spine is new since the prior examination.  Spondylosis has progressed at all levels since the 2011 study. Degenerative change is worst at L4-5 where there is severe bilateral facet degenerative change resulting in 0.4 cm anterolisthesis. Severe central canal and bilateral subarticular recess narrowing are seen at L4-5, and there is moderately severe to severe foraminal narrowing, worse on the left. See above for descriptions of additional levels.

## 2019-03-08 ENCOUNTER — Encounter: Payer: Self-pay | Admitting: *Deleted

## 2019-03-20 ENCOUNTER — Ambulatory Visit: Payer: Medicare Other | Admitting: Neurology

## 2019-04-28 ENCOUNTER — Other Ambulatory Visit: Payer: Self-pay

## 2019-04-28 ENCOUNTER — Ambulatory Visit (HOSPITAL_COMMUNITY)
Admission: RE | Admit: 2019-04-28 | Discharge: 2019-04-28 | Disposition: A | Discharge status: Home / Self Care | Payer: Medicare Other | Source: Ambulatory Visit | Attending: Internal Medicine | Admitting: Internal Medicine

## 2019-04-28 ENCOUNTER — Inpatient Hospital Stay: Payer: Medicare Other | Attending: Internal Medicine

## 2019-04-28 DIAGNOSIS — C3431 Malignant neoplasm of lower lobe, right bronchus or lung: Secondary | ICD-10-CM | POA: Diagnosis present

## 2019-04-28 DIAGNOSIS — I2510 Atherosclerotic heart disease of native coronary artery without angina pectoris: Secondary | ICD-10-CM

## 2019-04-28 DIAGNOSIS — J90 Pleural effusion, not elsewhere classified: Secondary | ICD-10-CM | POA: Insufficient documentation

## 2019-04-28 DIAGNOSIS — C3490 Malignant neoplasm of unspecified part of unspecified bronchus or lung: Secondary | ICD-10-CM | POA: Insufficient documentation

## 2019-04-28 DIAGNOSIS — Z79899 Other long term (current) drug therapy: Secondary | ICD-10-CM | POA: Insufficient documentation

## 2019-04-28 LAB — CBC WITH DIFFERENTIAL (CANCER CENTER ONLY)
Abs Immature Granulocytes: 0.03 10*3/uL (ref 0.00–0.07)
Basophils Absolute: 0 10*3/uL (ref 0.0–0.1)
Basophils Relative: 0 %
Eosinophils Absolute: 0.1 10*3/uL (ref 0.0–0.5)
Eosinophils Relative: 1 %
HCT: 34.4 % — ABNORMAL LOW (ref 36.0–46.0)
Hemoglobin: 11.1 g/dL — ABNORMAL LOW (ref 12.0–15.0)
Immature Granulocytes: 0 %
Lymphocytes Relative: 12 %
Lymphs Abs: 1.4 10*3/uL (ref 0.7–4.0)
MCH: 30.2 pg (ref 26.0–34.0)
MCHC: 32.3 g/dL (ref 30.0–36.0)
MCV: 93.7 fL (ref 80.0–100.0)
Monocytes Absolute: 0.8 10*3/uL (ref 0.1–1.0)
Monocytes Relative: 7 %
Neutro Abs: 8.9 10*3/uL — ABNORMAL HIGH (ref 1.7–7.7)
Neutrophils Relative %: 80 %
Platelet Count: 245 10*3/uL (ref 150–400)
RBC: 3.67 MIL/uL — ABNORMAL LOW (ref 3.87–5.11)
RDW: 16.2 % — ABNORMAL HIGH (ref 11.5–15.5)
WBC Count: 11.3 10*3/uL — ABNORMAL HIGH (ref 4.0–10.5)
nRBC: 0 % (ref 0.0–0.2)

## 2019-04-28 LAB — CMP (CANCER CENTER ONLY)
ALT: 12 U/L (ref 0–44)
AST: 15 U/L (ref 15–41)
Albumin: 3.3 g/dL — ABNORMAL LOW (ref 3.5–5.0)
Alkaline Phosphatase: 109 U/L (ref 38–126)
Anion gap: 11 (ref 5–15)
BUN: 33 mg/dL — ABNORMAL HIGH (ref 8–23)
CO2: 40 mmol/L — ABNORMAL HIGH (ref 22–32)
Calcium: 9 mg/dL (ref 8.9–10.3)
Chloride: 88 mmol/L — ABNORMAL LOW (ref 98–111)
Creatinine: 1.54 mg/dL — ABNORMAL HIGH (ref 0.44–1.00)
GFR, Est AFR Am: 40 mL/min — ABNORMAL LOW (ref >60)
GFR, Estimated: 34 mL/min — ABNORMAL LOW (ref >60)
Glucose, Bld: 145 mg/dL — ABNORMAL HIGH (ref 70–99)
Potassium: 3.5 mmol/L (ref 3.5–5.1)
Sodium: 139 mmol/L (ref 135–145)
Total Bilirubin: <0.2 mg/dL — ABNORMAL LOW (ref 0.3–1.2)
Total Protein: 7.2 g/dL (ref 6.5–8.1)

## 2019-04-28 MED ORDER — ROSUVASTATIN CALCIUM 40 MG PO TABS: 40.0000 mg | ORAL_TABLET | Freq: Every day | ORAL | 3 refills | Status: DC

## 2019-05-01 ENCOUNTER — Other Ambulatory Visit: Payer: Self-pay

## 2019-05-01 ENCOUNTER — Encounter: Payer: Self-pay | Admitting: Internal Medicine

## 2019-05-01 ENCOUNTER — Inpatient Hospital Stay: Payer: Medicare Other | Attending: Internal Medicine | Admitting: Internal Medicine

## 2019-05-01 VITALS — BP 135/68 | HR 58 | Temp 97.8°F | Resp 17 | Ht 61.0 in | Wt 146.2 lb

## 2019-05-01 DIAGNOSIS — C3491 Malignant neoplasm of unspecified part of right bronchus or lung: Secondary | ICD-10-CM

## 2019-05-01 DIAGNOSIS — C3490 Malignant neoplasm of unspecified part of unspecified bronchus or lung: Secondary | ICD-10-CM

## 2019-05-01 DIAGNOSIS — M545 Low back pain: Secondary | ICD-10-CM | POA: Diagnosis not present

## 2019-05-01 DIAGNOSIS — J90 Pleural effusion, not elsewhere classified: Secondary | ICD-10-CM | POA: Diagnosis not present

## 2019-05-01 DIAGNOSIS — Z902 Acquired absence of lung [part of]: Secondary | ICD-10-CM | POA: Diagnosis not present

## 2019-05-01 DIAGNOSIS — Z85118 Personal history of other malignant neoplasm of bronchus and lung: Secondary | ICD-10-CM | POA: Diagnosis not present

## 2019-05-01 DIAGNOSIS — R918 Other nonspecific abnormal finding of lung field: Secondary | ICD-10-CM | POA: Insufficient documentation

## 2019-05-01 DIAGNOSIS — I2510 Atherosclerotic heart disease of native coronary artery without angina pectoris: Secondary | ICD-10-CM | POA: Diagnosis not present

## 2019-05-01 NOTE — Progress Notes (Signed)
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  PRIOR THERAPY: Status post right lower lobectomy with lymph node dissection on April 04, 2018 under the care of Dr. Roxan Hockey.  CURRENT THERAPY: Observation.  INTERVAL HISTORY: Maria Arroyo 69 y.o. female returns to the clinic today for follow-up visit.  The patient is feeling fine today with no concerning complaints except for low back pain.  She denied having any significant chest pain, shortness of breath, cough or hemoptysis.  She denied having any recent weight loss or night sweats.  She has no nausea, vomiting, diarrhea or constipation.  She denied having any headache or visual changes.  She had repeat CT scan of the chest performed recently and she is here for evaluation and discussion of her risk her results.  MEDICAL HISTORY: Past Medical History:  Diagnosis Date  . Cancer ([place])    lung  . Cervical disc disease   . 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 COPD (chronic obstructive pulmonary disease) ([place])   . Coronary artery disease   . Depression   . Gout    R foot and knee  . Hyperlipidemia   . Hypertension   . Left foot drop   . Neuropathy   . OA (osteoarthritis)   . [place]   . Tobacco abuse     ALLERGIES:  is allergic to ace inhibitors; codeine; and codeine-guaifenesin [guaifenesin-codeine].  MEDICATIONS:  Current Outpatient Medications  Medication Sig Dispense Refill  . acetaminophen (TYLENOL) 500 MG tablet Take 2 tablets (1,000 mg total) by mouth every 6 (six) hours. 30 tablet 0  . allopurinol (ZYLOPRIM) 100 MG tablet Take 100 mg by mouth daily.     [name] amiodarone (PACERONE) 200 MG tablet Take 1 tablet (200 mg total) by mouth daily. 90  tablet 3  . apixaban (ELIQUIS) 5 MG TABS tablet Take 1 tablet (5 mg total) by mouth 2 (two) times daily. 60 tablet 1  . bisoprolol (ZEBETA) 5 MG tablet Take 0.5 tablets (2.5 mg total) by mouth daily. 30 tablet 11  . busPIRone (BUSPAR) 15 MG tablet Take 15 mg by mouth 2 (two) times daily.    . Calcium Carbonate-Vitamin D (CALCIUM 600 + D PO) Take 1 tablet by mouth daily.     . carisoprodol (SOMA) 350 MG tablet Take 1 tablet by mouth 4 (four) times daily as needed for muscle spasms.    . Cholecalciferol (VITAMIN D) 2000 UNITS tablet Take 4,000 Units by mouth daily.     . furosemide (LASIX) 20 MG tablet TAKE 2 TABLETS(40 MG) BY MOUTH DAILY (Patient taking differently: Take 40 mg by mouth daily. ) 180 tablet 0  . gabapentin (NEURONTIN) 600 MG tablet TAKE 1 TABLET(600 MG) BY MOUTH FOUR TIMES DAILY AT 8 AM AND AT 1 PM AND AT 6 PM AND AT 10 PM (Patient taking differently: Take 600 mg by mouth 4 (four) times daily. ) 120 tablet 11  . hydrochlorothiazide (MICROZIDE) 12.5 MG capsule Take 1 capsule (12.5 mg total) by mouth daily. 45 capsule 0  . Multiple Vitamin (MULTIVITAMIN WITH MINERALS) TABS tablet Take 1 tablet by mouth daily. 30 tablet 0  . nicotine (NICODERM CQ - DOSED IN MG/24 HOURS) 21 mg/24hr patch Place 1 patch (  21 mg total) onto the skin daily. 28 patch 0  . nitroGLYCERIN (NITROSTAT) 0.4 MG SL tablet Place 1 tablet (0.4 mg total) under the tongue every 5 (five) minutes as needed. For chest pain (Patient taking differently: Place 0.4 mg under the tongue every 5 (five) minutes as needed for chest pain. ) 100 tablet 0  . oxyCODONE-acetaminophen (PERCOCET) 10-325 MG tablet Take 1 tablet by mouth 4 (four) times daily as needed for pain.    [name] PROVENTIL HFA 108 (90 Base) MCG/ACT inhaler Inhale 1-2 puffs into the lungs every 4 (four) hours as needed for wheezing or shortness of breath.   4  . rosuvastatin (CRESTOR) 40 MG tablet Take 1 tablet (40 mg total) by mouth daily. 90 tablet 3  . venlafaxine XR  (EFFEXOR-XR) 75 MG 24 hr capsule Take 75 mg by mouth 2 (two) times daily.   0   No current facility-administered medications for this visit.    SURGICAL HISTORY:  Past Surgical History:  Procedure Laterality Date  . ABDOMINAL HYSTERECTOMY    . ANTERIOR CERVICAL DECOMP/DISCECTOMY FUSION  02/06/2011   Procedure: ANTERIOR CERVICAL DECOMPRESSION/DISCECTOMY FUSION 2 LEVELS;  Surgeon: Winfield Cunas;  Location: [facility];  Service: Neurosurgery;  Laterality: N/A;  Anterior Cervical Four-Five/Five-Six Decompression and Fusion with Plating and Bonegraft  . BLADDER SURGERY    . CARDIAC CATHETERIZATION  04/09/2009   EF 60%  . CARDIAC CATHETERIZATION  03/24/2001   EF 55%  . CARDIAC CATHETERIZATION  01/06/2001   EF 65%  . CHOLECYSTECTOMY    . CORONARY STENT PLACEMENT  03/2009   STENTING OF THE PROXIMAL TO MID RIGHT CORONARY  . IR THORACENTESIS ASP PLEURAL SPACE W/IMG GUIDE  06/01/2018  . LEFT HEART CATHETERIZATION WITH CORONARY ANGIOGRAM N/A 12/21/2011   Procedure: LEFT HEART CATHETERIZATION WITH CORONARY ANGIOGRAM;  Surgeon: Peter M Martinique, MD;  Location: [facility] CATH LAB;  Service: Cardiovascular;  Laterality: N/A;  . LOBECTOMY    . NODE DISSECTION N/A 04/04/2018   Procedure: NODE DISSECTION;  Surgeon: Melrose Nakayama, MD;  Location: [facility];  Service: Thoracic;  Laterality: N/A;  . OTHER SURGICAL HISTORY  12/2014   R foot cyst removal  . VIDEO ASSISTED THORACOSCOPY (VATS)/ LOBECTOMY Right 04/04/2018   Procedure: VIDEO ASSISTED THORACOSCOPY (VATS)/RIGHT LOWER LOBECTOMY;  Surgeon: Melrose Nakayama, MD;  Location: [facility];  Service: Thoracic;  Laterality: Right;    REVIEW OF SYSTEMS:  A comprehensive review of systems was negative except for: Respiratory: positive for dyspnea on exertion Musculoskeletal: positive for back pain   PHYSICAL EXAMINATION: General appearance: alert, cooperative, fatigued and no distress Head: Normocephalic, without obvious abnormality, atraumatic Neck: no adenopathy,  no JVD, supple, symmetrical, trachea midline and thyroid not enlarged, symmetric, no tenderness/mass/nodules Lymph nodes: Cervical, supraclavicular, and axillary nodes normal. Resp: clear to auscultation bilaterally Back: symmetric, no curvature. ROM normal. No CVA tenderness. Cardio: regular rate and rhythm, S1, S2 normal, no murmur, click, rub or gallop GI: soft, non-tender; bowel sounds normal; no masses,  no organomegaly Extremities: extremities normal, atraumatic, no cyanosis or edema  ECOG PERFORMANCE STATUS: 1 - Symptomatic but completely ambulatory  Blood pressure 135/68, pulse (!) 58, temperature 97.8 F (36.6 C), temperature source Temporal, resp. rate 17, height 5\' 1"  (1.549 m), weight 146 lb 3.2 oz (66.3 kg), SpO2 93 %.  LABORATORY DATA: Lab Results  Component Value Date   WBC 11.3 (H) 04/28/2019   HGB 11.1 (L) 04/28/2019   HCT 34.4 (L) 04/28/2019   MCV 93.7 04/28/2019  PLT 245 04/28/2019      Chemistry      Component Value Date/Time   NA 139 04/28/2019 0957   NA 143 05/03/2018 1530   K 3.5 04/28/2019 0957   CL 88 (L) 04/28/2019 0957   CO2 40 (H) 04/28/2019 0957   BUN 33 (H) 04/28/2019 0957   BUN 20 05/03/2018 1530   CREATININE 1.54 (H) 04/28/2019 0957   CREATININE 0.86 12/18/2011 1627      Component Value Date/Time   CALCIUM 9.0 04/28/2019 0957   ALKPHOS 109 04/28/2019 0957   AST 15 04/28/2019 0957   ALT 12 04/28/2019 0957   BILITOT <0.2 (L) 04/28/2019 0957       RADIOGRAPHIC STUDIES: CT Chest Wo Contrast  Result Date: 04/28/2019 CLINICAL DATA:  Non-small-cell lung cancer diagnosed in January 2020 status post right lower lobectomy. No subsequent therapy. No current complaints. EXAM: CT CHEST WITHOUT CONTRAST TECHNIQUE: Multidetector CT imaging of the chest was performed following the standard protocol without IV contrast. COMPARISON:  CT 10/24/2018 and 05/27/2018. FINDINGS: Cardiovascular: Extensive atherosclerosis of the aorta, great vessels and  coronary arteries. There is central enlargement of the pulmonary arteries consistent with pulmonary arterial hypertension. No acute vascular findings on noncontrast imaging. The heart size is normal. There is no pericardial effusion. Mediastinum/Nodes: There are no enlarged mediastinal, hilar or axillary lymph nodes.Hilar assessment is limited by the lack of intravenous contrast, although the hilar contours appear unchanged. The thyroid gland, trachea and esophagus demonstrate no significant findings. Lungs/Pleura: A chronic moderate size dependent right pleural effusion is unchanged. There is no left-sided pleural effusion or pericardial effusion. Patient is status post right lower lobectomy. There are stable changes of centrilobular and paraseptal emphysema. There are mildly increased patchy ground-glass opacities, primarily within the left lung, likely inflammatory. There is also mild increased nodularity in the lungs, including a new 5 mm left lower lobe nodule on image 62/8. Other nodules are stable, including a left upper lobe nodule on image 51/8. Upper abdomen: Stable appearance of the visualized upper abdomen without significant findings. There is stable low-density enlargement of both adrenal glands consistent with hyperplasia. Musculoskeletal/Chest wall: There is no chest wall mass or suspicious osseous finding. Stable degenerative changes in the lower thoracic spine. Previous cervical fusion. IMPRESSION: 1. No findings highly suspicious for local recurrence or metastatic disease. 2. Nonspecific pattern of mildly increased nodularity and ground-glass opacity in both lungs, likely inflammatory. No consolidation or suspicious pulmonary nodules. CT follow-up within 6 months recommend. 3. Stable chronic right pleural effusion. 4. Central enlargement of the pulmonary arteries consistent with pulmonary arterial hypertension. 5. Coronary and Aortic Atherosclerosis (ICD10-I70.0). Emphysema (ICD10-J43.9).  Electronically Signed   By: Richardean Sale M.D.   On: 04/28/2019 12:14    ASSESSMENT AND PLAN: This is a very pleasant 69 years old white female with a stage Ib non-small cell lung cancer, poorly differentiated squamous cell carcinoma status post right lower lobectomy with lymph node dissection on April 04, 2018 under the care of Dr. Roxan Hockey. The patient is currently on observation and she is feeling fine today. She had repeat CT scan of the chest performed recently.  I personally and independently reviewed the scans and discussed the results with the patient today. Her scan showed no concerning findings for disease recurrence or metastasis. I recommended for her to continue on observation with repeat CT scan of the chest in 6 months. She was advised to call immediately if she has any concerning symptoms in the interval. The patient  voices understanding of current disease status and treatment options and is in agreement with the current care plan. All questions were answered. The patient knows to call the clinic with any problems, questions or concerns. We can certainly see the patient much sooner if necessary.   Disclaimer: This note was dictated with voice recognition software. Similar sounding words can inadvertently be transcribed and may not be corrected upon review.

## 2019-05-03 ENCOUNTER — Telehealth: Payer: Self-pay | Admitting: Internal Medicine

## 2019-05-03 NOTE — Telephone Encounter (Signed)
Scheduled per los. Called and left msg. Mailed printout  °

## 2019-05-18 ENCOUNTER — Telehealth: Payer: Self-pay | Admitting: Neurology

## 2019-05-18 MED ORDER — GABAPENTIN 600 MG PO TABS: ORAL_TABLET | ORAL | 3 refills | Status: DC

## 2019-05-18 NOTE — Telephone Encounter (Signed)
The prescription for gabapentin was sent in. 

## 2019-05-28 ENCOUNTER — Ambulatory Visit: Payer: Medicare Other | Attending: Internal Medicine

## 2019-05-28 DIAGNOSIS — Z23 Encounter for immunization: Secondary | ICD-10-CM | POA: Insufficient documentation

## 2019-05-28 NOTE — Progress Notes (Signed)
   Covid-19 Vaccination Clinic  Name:  Maria Arroyo    MRN: [id number] DOB: [birth year]  05/28/2019  Maria Arroyo was observed post Covid-19 immunization for 15 minutes without incidence. She was provided with Vaccine Information Sheet and instruction to access the V-Safe system.   Maria Arroyo was instructed to call 911 with any severe reactions post vaccine: [name] Difficulty breathing  . Swelling of your face and throat  . A fast heartbeat  . A bad rash all over your body  . Dizziness and weakness    Immunizations Administered    Name Date Dose VIS Date Route   Pfizer COVID-19 Vaccine 05/28/2019  3:34 PM 0.3 mL 03/10/2019 Intramuscular   Manufacturer: [place]   Lot: [id number]   [place]: [id number]

## 2019-06-08 ENCOUNTER — Encounter: Payer: Self-pay | Admitting: Physician Assistant

## 2019-06-08 ENCOUNTER — Telehealth (INDEPENDENT_AMBULATORY_CARE_PROVIDER_SITE_OTHER): Payer: Medicare Other | Admitting: Physician Assistant

## 2019-06-08 ENCOUNTER — Telehealth: Payer: Self-pay

## 2019-06-08 VITALS — Ht 61.0 in | Wt 147.0 lb

## 2019-06-08 DIAGNOSIS — I2510 Atherosclerotic heart disease of native coronary artery without angina pectoris: Secondary | ICD-10-CM

## 2019-06-08 DIAGNOSIS — I480 Paroxysmal atrial fibrillation: Secondary | ICD-10-CM

## 2019-06-08 DIAGNOSIS — E785 Hyperlipidemia, unspecified: Secondary | ICD-10-CM

## 2019-06-08 DIAGNOSIS — I10 Essential (primary) hypertension: Secondary | ICD-10-CM

## 2019-06-08 DIAGNOSIS — C3491 Malignant neoplasm of unspecified part of right bronchus or lung: Secondary | ICD-10-CM

## 2019-06-08 DIAGNOSIS — J449 Chronic obstructive pulmonary disease, unspecified: Secondary | ICD-10-CM

## 2019-06-08 NOTE — Patient Instructions (Signed)
Medication Instructions:  Your physician recommends that you continue on your current medications as directed. Please refer to the Current Medication list given to you today.  *If you need a refill on your cardiac medications before your next appointment, please call your pharmacy*  Lab Work: NONE ordered at this time of appointment   If you have labs (blood work) drawn today and your tests are completely normal, you will receive your results only by: [name] MyChart Message (if you have MyChart) OR . A paper copy in the mail If you have any lab test that is abnormal or we need to change your treatment, we will call you to review the results.  Testing/Procedures: NONE ordered at this time of appointment   Follow-Up: At [facility], you and your health needs are our priority.  As part of our continuing mission to provide you with exceptional heart care, we have created designated Provider Care Teams.  These Care Teams include your primary Cardiologist (physician) and Advanced Practice Providers (APPs -  Physician Assistants and Nurse Practitioners) who all work together to provide you with the care you need, when you need it.  We recommend signing up for the patient portal called "MyChart".  Sign up information is provided on this After Visit Summary.  MyChart is used to connect with patients for Virtual Visits (Telemedicine).  Patients are able to view lab/test results, encounter notes, upcoming appointments, etc.  Non-urgent messages can be sent to your provider as well.   To learn more about what you can do with MyChart, go to [email or web address].    Your next appointment:   6 month(s)  The format for your next appointment:   In Person  Provider:   Peter Martinique, MD  Other Instructions

## 2019-06-08 NOTE — Telephone Encounter (Signed)
Left a detailed message for the patient informing her that I was calling to prepare her for her 9:15AM virtual visit with Almyra Deforest, PA-C to give our office a call and that I will be trying to reach her again.

## 2019-06-08 NOTE — Progress Notes (Signed)
Virtual Visit via Telephone Note   This visit type was conducted due to national recommendations for restrictions regarding the COVID-19 Pandemic (e.g. social distancing) in an effort to limit this patient's exposure and mitigate transmission in our community.  Due to her co-morbid illnesses, this patient is at least at moderate risk for complications without adequate follow up.  This format is felt to be most appropriate for this patient at this time.  The patient did not have access to video technology/had technical difficulties with video requiring transitioning to audio format only (telephone).  All issues noted in this document were discussed and addressed.  No physical exam could be performed with this format.  Please refer to the patient's chart for her  consent to telehealth for Maria Arroyo.   The patient was identified using 2 identifiers.  Date:  06/10/2019   ID:  Maria Arroyo, DOB 11/25/1950, MRN [id number]  Patient Location: Home Provider Location: Home  PCP:  [name], MD  Cardiologist:  Peter Martinique, MD  Electrophysiologist:  None   Evaluation Performed:  Follow-Up Visit  Chief Complaint:  followup  History of Present Illness:    [name] is a 69 y.o. female with PMH of HTN, HLD, COPD, tobacco abuse, lung nodule and CAD.  She underwent stenting of mid RCA in December 1999 and stenting of proximal RCA in October 2002.  She later developed a high-grade stenosis in between the previous stents and underwent additional DES to RCA in January 2011.  Cardiac catheterization in September 2013 revealed nonobstructive CAD.  Since then, she has been kept on dual antiplatelet therapy.  Due to unexplained weight loss, she underwent a cold malignancy work-up which revealed a right lower lobe lung nodule.  PET scan showed hypermetabolic mass.  She underwent VATS procedure by Dr. 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  Postop course was complicated by occurrence of atrial fibrillation requiring IV amiodarone.  She initially converted however had a recurrence of atrial fibrillation during the same hospitalization.  Therefore Eliquis was started.  Patient was admitted in May 2020 with acute respiratory failure and hypoxemia, work-up revealed worsening right pleural effusion and severe anemia with hemoglobin of 6.7.  Patient underwent packed red blood cell transfusion x2 and underwent diuresis with IV Lasix.  She was discharged on Lasix and hydrochlorothiazide and also home oxygen.  Repeat echocardiogram obtained on 08/27/2018 showed EF 55 to 60%, moderately dilated right atrium, mild to moderate MR.  Since the previous visit, her diuretic has been switched to torsemide and also she is no longer taking hydrochlorothiazide based on her own account.  She does not have any lower extremity edema.  She is on 3 L nasal cannula oxygen essentially 24/7.  She had her first COVID-19 vaccine shot and is due for her second [place] shot on March 30.  Otherwise she denies any chest pain or palpitation.  She can continue on the current medication and follow-up with Dr. Martinique in 6 months.  The patient does not have symptoms concerning for COVID-19 infection (fever, chills, cough, or new shortness of breath).    Past Medical History:  Diagnosis Date  . Cancer ([place])    lung  . Cervical disc disease   . 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 COPD (chronic obstructive pulmonary disease) ([place])   . Coronary artery disease   . Depression   . Gout    R foot and  knee  . Hyperlipidemia   . Hypertension   . Left foot drop   . Neuropathy   . OA (osteoarthritis)   . Pica   . Tobacco abuse    Past Surgical History:  Procedure Laterality Date  . ABDOMINAL HYSTERECTOMY    . ANTERIOR CERVICAL DECOMP/DISCECTOMY FUSION  02/06/2011   Procedure: ANTERIOR CERVICAL DECOMPRESSION/DISCECTOMY  FUSION 2 LEVELS;  Surgeon: Winfield Cunas;  Location: [facility];  Service: Neurosurgery;  Laterality: N/A;  Anterior Cervical Four-Five/Five-Six Decompression and Fusion with Plating and Bonegraft  . BLADDER SURGERY    . CARDIAC CATHETERIZATION  04/09/2009   EF 60%  . CARDIAC CATHETERIZATION  03/24/2001   EF 55%  . CARDIAC CATHETERIZATION  01/06/2001   EF 65%  . CHOLECYSTECTOMY    . CORONARY STENT PLACEMENT  03/2009   STENTING OF THE PROXIMAL TO MID RIGHT CORONARY  . IR THORACENTESIS ASP PLEURAL SPACE W/IMG GUIDE  06/01/2018  . LEFT HEART CATHETERIZATION WITH CORONARY ANGIOGRAM N/A 12/21/2011   Procedure: LEFT HEART CATHETERIZATION WITH CORONARY ANGIOGRAM;  Surgeon: Peter M Martinique, MD;  Location: [facility] CATH LAB;  Service: Cardiovascular;  Laterality: N/A;  . LOBECTOMY    . NODE DISSECTION N/A 04/04/2018   Procedure: NODE DISSECTION;  Surgeon: Melrose Nakayama, MD;  Location: [facility];  Service: Thoracic;  Laterality: N/A;  . OTHER SURGICAL HISTORY  12/2014   R foot cyst removal  . VIDEO ASSISTED THORACOSCOPY (VATS)/ LOBECTOMY Right 04/04/2018   Procedure: VIDEO ASSISTED THORACOSCOPY (VATS)/RIGHT LOWER LOBECTOMY;  Surgeon: Melrose Nakayama, MD;  Location: [facility];  Service: Thoracic;  Laterality: Right;     Current Meds  Medication Sig  . acetaminophen (TYLENOL) 500 MG tablet Take 2 tablets (1,000 mg total) by mouth every 6 (six) hours.  [name] allopurinol (ZYLOPRIM) 100 MG tablet Take 100 mg by mouth daily.   [name] amiodarone (PACERONE) 200 MG tablet Take 1 tablet (200 mg total) by mouth daily.  [name] apixaban (ELIQUIS) 5 MG TABS tablet Take 1 tablet (5 mg total) by mouth 2 (two) times daily.  . Ascorbic Acid (VITAMIN C PO) Take 1,000 mg by mouth daily.  . bisoprolol (ZEBETA) 5 MG tablet Take 0.5 tablets (2.5 mg total) by mouth daily.  . busPIRone (BUSPAR) 15 MG tablet Take 15 mg by mouth 2 (two) times daily.  . Calcium Carbonate-Vitamin D (CALCIUM 600 + D PO) Take 1 tablet by mouth daily.   .  carisoprodol (SOMA) 350 MG tablet Take 1 tablet by mouth 4 (four) times daily as needed for muscle spasms.  . Cholecalciferol (VITAMIN D) 2000 UNITS tablet Take 4,000 Units by mouth daily.   [name] gabapentin (NEURONTIN) 600 MG tablet TAKE 1 TABLET(600 MG) BY MOUTH FOUR TIMES DAILY AT 8 AM AND AT 1 PM AND AT 6 PM AND AT 10 PM  . Multiple Vitamin (MULTIVITAMIN WITH MINERALS) TABS tablet Take 1 tablet by mouth daily.  . nicotine (NICODERM CQ - DOSED IN MG/24 HOURS) 21 mg/24hr patch Place 1 patch (21 mg total) onto the skin daily.  [name] oxyCODONE-acetaminophen (PERCOCET) 10-325 MG tablet Take 1 tablet by mouth 4 (four) times daily as needed for pain.  . rosuvastatin (CRESTOR) 40 MG tablet Take 1 tablet (40 mg total) by mouth daily.  [name] torsemide (DEMADEX) 20 MG tablet TAKE 1 TABLET BY MOUTH DAILY  . traZODone (DESYREL) 50 MG tablet   . venlafaxine XR (EFFEXOR-XR) 75 MG 24 hr capsule Take 75 mg by mouth 2 (two) times daily.   . [  DISCONTINUED] furosemide (LASIX) 20 MG tablet TAKE 2 TABLETS(40 MG) BY MOUTH DAILY (Patient taking differently: Take 40 mg by mouth daily. )     Allergies:   Ace inhibitors, Codeine, and Codeine-guaifenesin [guaifenesin-codeine]   Social History   Tobacco Use  . Smoking status: Current Every Day Smoker    Packs/day: 2.00    Years: 35.00    Pack years: 70.00    Types: Cigarettes  . Smokeless tobacco: Never Used  . Tobacco comment: 1-1.5 ppd (10/02/13), 03/17/18 2 PPD  Substance Use Topics  . Alcohol use: No    Alcohol/week: 0.0 standard drinks  . Drug use: No     Family Hx: The patient's family history includes Aneurysm in her mother; Heart attack in her brother and father; Heart failure in her father; Stroke in her sister.  ROS:   Please see the history of present illness.     All other systems reviewed and are negative.   Prior CV studies:   The following studies were reviewed today:  Echo 08/27/2018 1. The left ventricle has normal systolic function, with an  ejection  fraction of 55-60%. The cavity size was mildly dilated. Left ventricular  diastolic Doppler parameters are consistent with impaired relaxation.  2. The right ventricle has moderately reduced systolic function. The  cavity was mildly enlarged. There is no increase in right ventricular wall  thickness.  3. Poor acoustic windows limit study of RV.  4. Right atrial size was moderately dilated.  5. The mitral valve is abnormal. Mild thickening of the mitral valve  leaflet. Mild calcification of the mitral valve leaflet. Mitral valve  regurgitation is mild to moderate by color flow Doppler.  6. MR is eccentric, directed posterior into LA Mild to moderate in  severity.  7. The aortic valve is abnormal. Mild thickening of the aortic valve.  Moderate calcification of the aortic valve.  8. The interatrial septum was not assessed.   Labs/Other Tests and Data Reviewed:    EKG:  An ECG dated 08/26/2018 was personally reviewed today and demonstrated:  Normal sinus rhythm without significant ST-T wave changes  Recent Labs: 08/26/2018: B Natriuretic Peptide 1,280.5 04/28/2019: ALT 12; BUN 33; Creatinine 1.54; Hemoglobin 11.1; Platelet Count 245; Potassium 3.5; Sodium 139   Recent Lipid Panel No results found for: CHOL, TRIG, HDL, CHOLHDL, LDLCALC, LDLDIRECT  Wt Readings from Last 3 Encounters:  06/08/19 147 lb (66.7 kg)  05/01/19 146 lb 3.2 oz (66.3 kg)  12/26/18 143 lb (64.9 kg)     Objective:    Vital Signs:  Ht 5\' 1"  (1.549 m)   Wt 147 lb (66.7 kg)   BMI 27.78 kg/m    VITAL SIGNS:  reviewed  ASSESSMENT & PLAN:    1. CAD: Denies any recent chest pain.  Not on aspirin given the need for Eliquis  2. COPD: On 3 L nasal cannula 24/7  3. PAF: Continue Eliquis and bisoprolol  4. Hypertension: Continue on current therapy  5. Hyperlipidemia: On Crestor  6. Lung cancer: Underwent left VATS procedure for right lower lobe lung cancer.  Pathology revealed non-small cell  carcinoma with clinical stage Ia  COVID-19 Education: The signs and symptoms of COVID-19 were discussed with the patient and how to seek care for testing (follow up with PCP or arrange E-visit).  The importance of social distancing was discussed today.  Time:   Today, I have spent 9 minutes with the patient with telehealth technology discussing the above problems.  Medication Adjustments/Labs and Tests Ordered: Current medicines are reviewed at length with the patient today.  Concerns regarding medicines are outlined above.   Tests Ordered: No orders of the defined types were placed in this encounter.   Medication Changes: No orders of the defined types were placed in this encounter.   Follow Up:  Either In Person or Virtual in 6 month(s)  Signed, Almyra Deforest, [place]  06/10/2019 10:41 PM    [place]

## 2019-06-27 ENCOUNTER — Ambulatory Visit: Payer: Medicare Other | Attending: Internal Medicine

## 2019-06-27 DIAGNOSIS — Z23 Encounter for immunization: Secondary | ICD-10-CM

## 2019-06-27 NOTE — Progress Notes (Signed)
   Covid-19 Vaccination Clinic  Name:  Maria Arroyo    MRN: [id number] DOB: March 09, 1951  06/27/2019  [name] was observed post Covid-19 immunization for 15 minutes without incident. She was provided with Vaccine Information Sheet and instruction to access the V-Safe system.   [name] was instructed to call 911 with any severe reactions post vaccine: [name] Difficulty breathing  . Swelling of face and throat  . A fast heartbeat  . A bad rash all over body  . Dizziness and weakness   Immunizations Administered    Name Date Dose VIS Date Route   Pfizer COVID-19 Vaccine 06/27/2019  3:11 PM 0.3 mL 03/10/2019 Intramuscular   Manufacturer: [place]   Lot: [id number]   [place]: [id number]

## 2019-10-05 ENCOUNTER — Other Ambulatory Visit: Payer: Self-pay | Admitting: Physician Assistant

## 2019-10-09 NOTE — Telephone Encounter (Signed)
Rx(s) sent to pharmacy electronically.  

## 2019-10-12 IMAGING — DX DG CHEST 1V PORT SAME DAY
1 series · 1 of 1 positions shown · non-contrast
Comparison: Chest x-ray from same day at [DATE] a.m..

CLINICAL DATA: Right-sided chest tube removal. Recent right lower
lobectomy.

EXAM:
PORTABLE CHEST 1 VIEW

[chest]
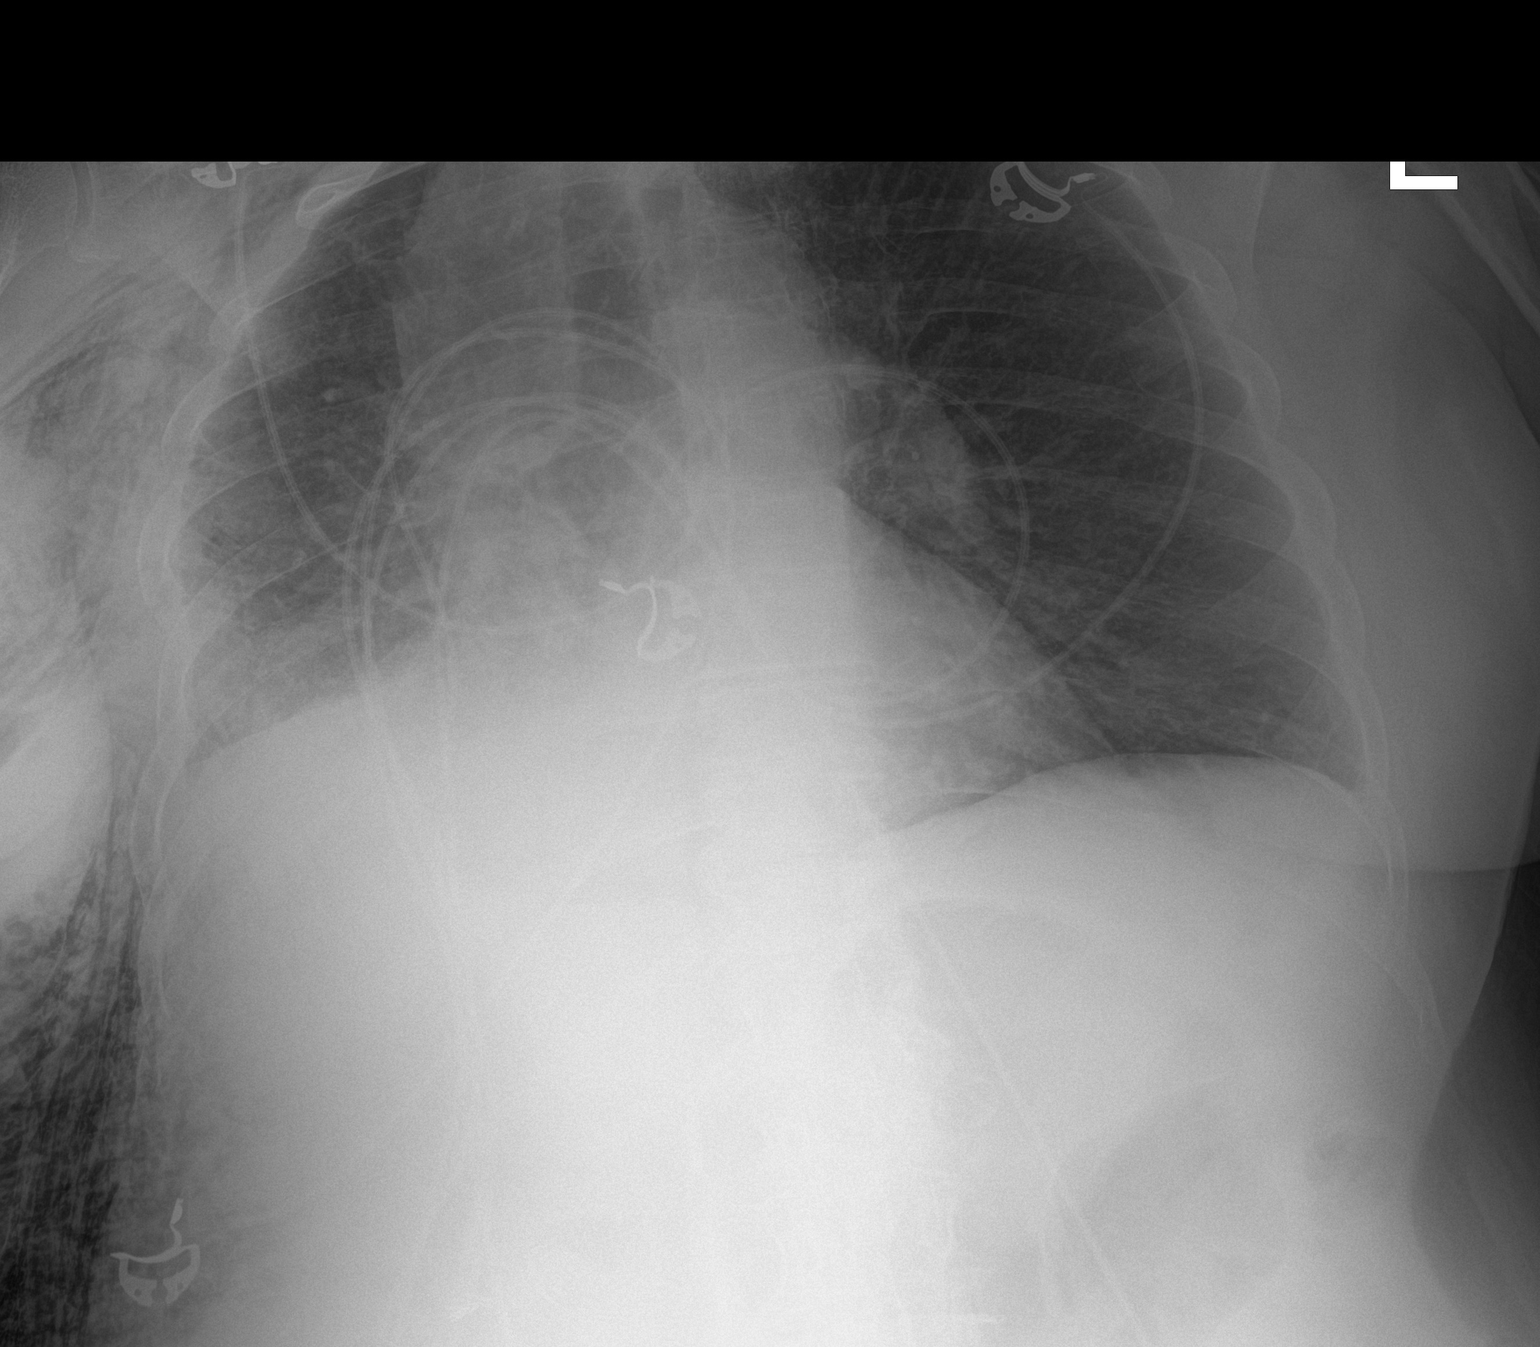

[1 of 1 positions shown; findings below may reference images not displayed]

FINDINGS: The patient is significantly rotated to the right, limiting
evaluation. Interval removal of the right-sided chest tube and right
internal jugular central venous catheter. No pneumothorax.
Postsurgical changes at the right lung base again noted with
unchanged volume loss in the right hemithorax. Mild atelectasis at
the right lung base is unchanged. The left lung is clear. No pleural
effusion. Stable mild cardiomegaly. No acute osseous abnormality.
Unchanged right chest wall subcutaneous emphysema.
IMPRESSION: 1. Limited study due to rightward rotation. Interval removal of the
right chest tube. No pneumothorax.
2. Stable postsurgical changes and volume loss in the right
hemithorax. Unchanged right basilar atelectasis.

## 2019-10-14 IMAGING — DX DG CHEST 2V
2 series · 2 of 2 positions shown · non-contrast
Comparison: the previous day's study

CLINICAL DATA: S/P lobectomy of lung

EXAM:
CHEST - 2 VIEW

[chest lat]
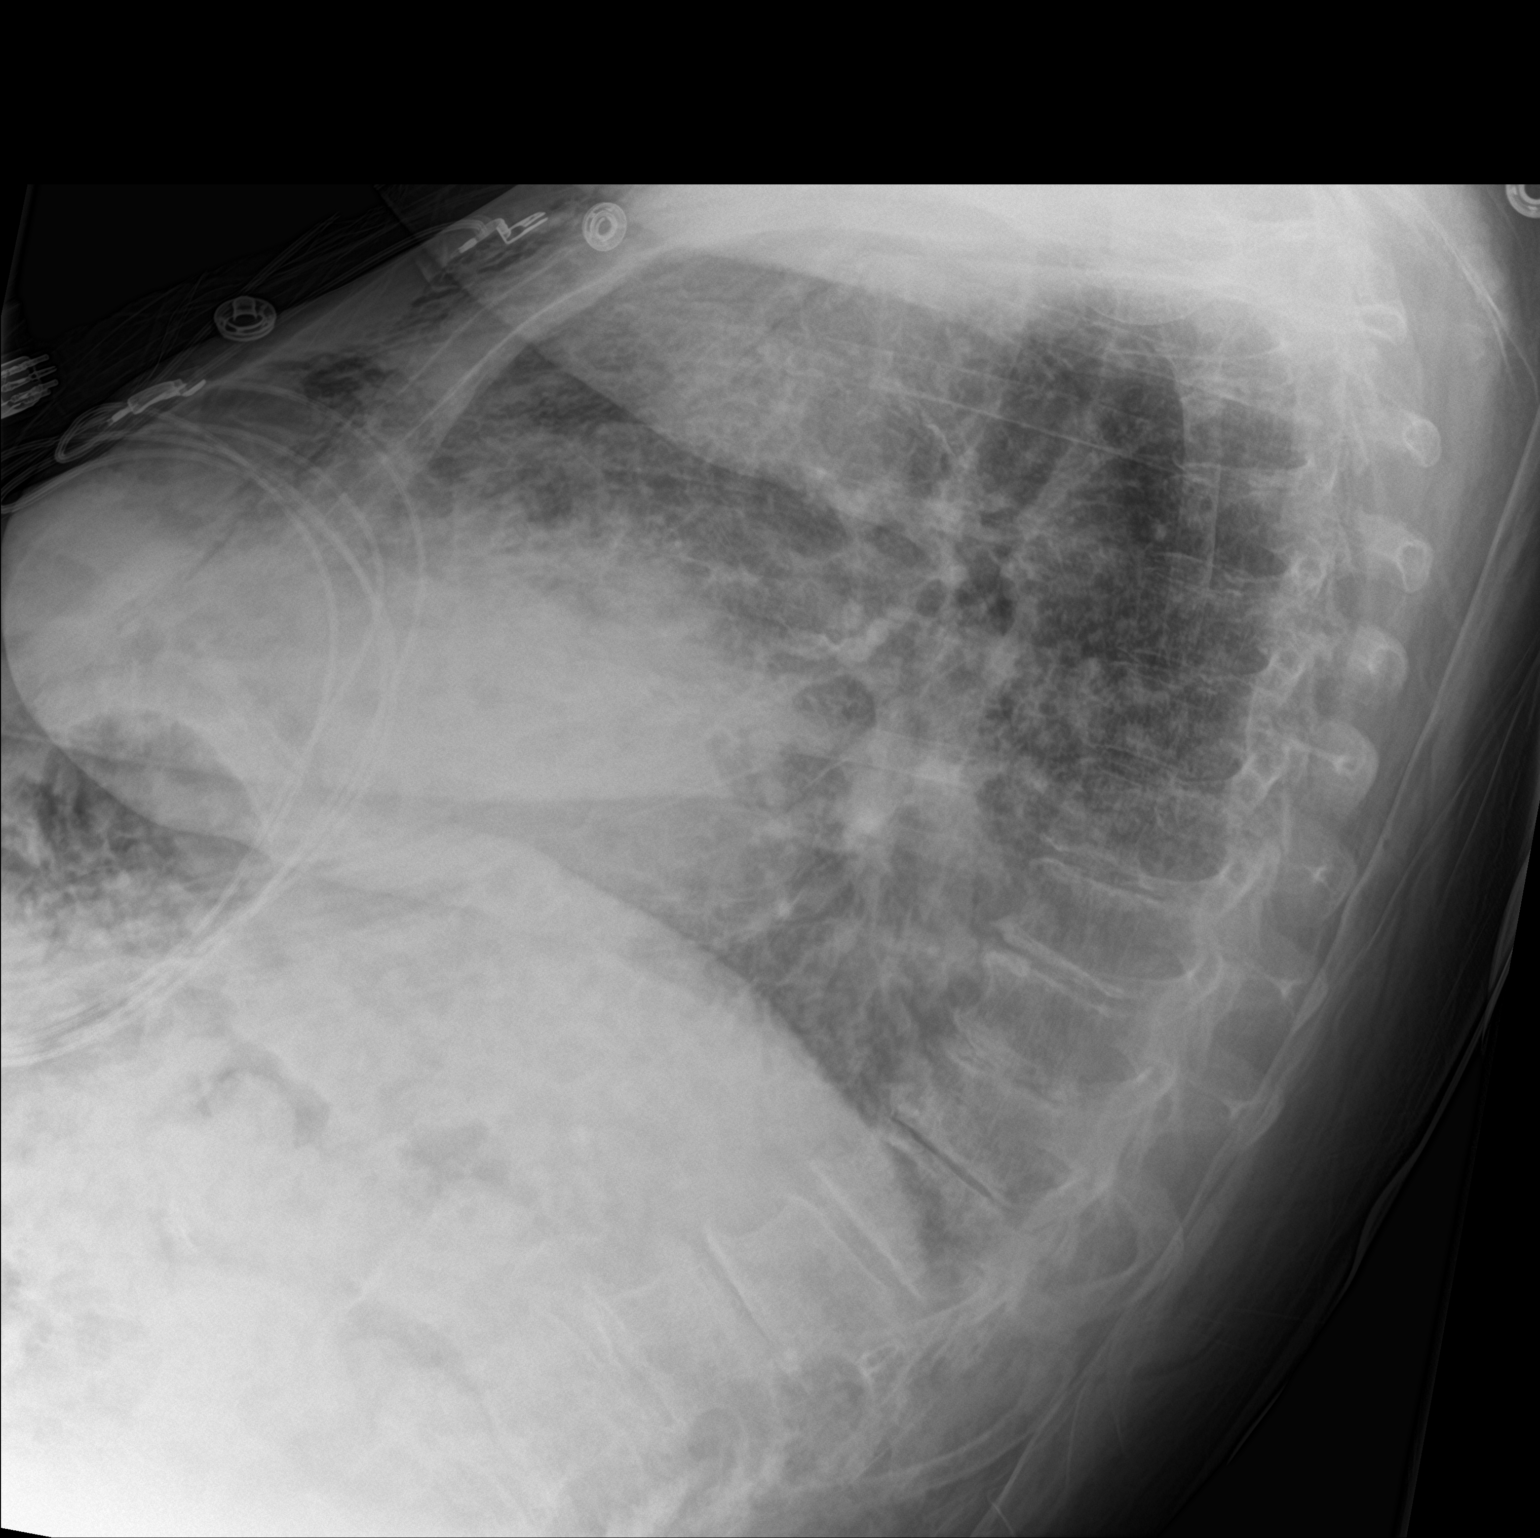

[chest ap]
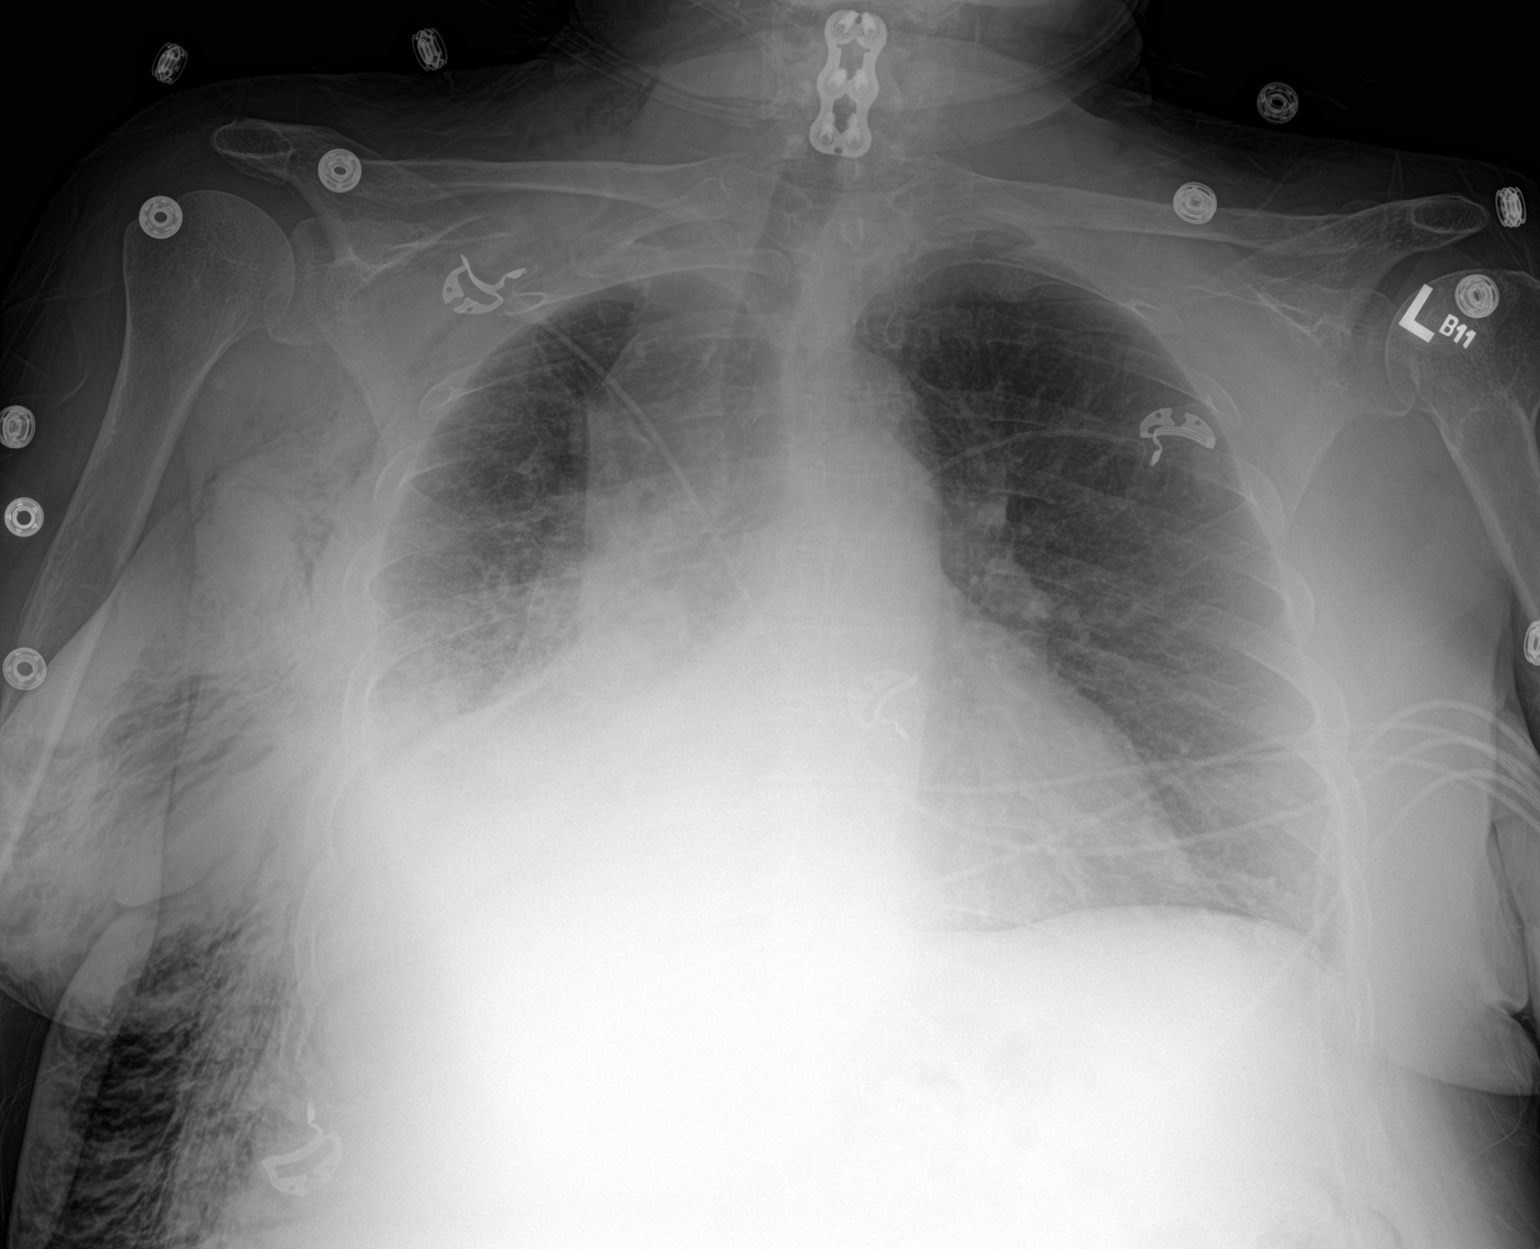

[2 of 2 positions shown; findings below may reference images not displayed]

FINDINGS: Persistent dense opacity at the right lung base with worsening right
mid lung interstitial and airspace opacities. Left lung remains
clear. Persistent volume loss with mediastinal shift towards the
right.

Heart size of part to assess due to adjacent opacities.

Cannot exclude right pleural effusion. Some decrease in the right
lateral subcutaneous emphysema. No pneumothorax.

Cervical fixation hardware. Anterior vertebral endplate spurring at
multiple levels in the lower thoracic spine.
IMPRESSION: Persistent right lower lung consolidation/atelectasis with worsening
right mid lung infiltrate or atelectasis.

## 2019-10-27 ENCOUNTER — Inpatient Hospital Stay: Payer: Medicare Other | Attending: Internal Medicine

## 2019-10-27 ENCOUNTER — Other Ambulatory Visit: Payer: Self-pay

## 2019-10-27 ENCOUNTER — Ambulatory Visit (HOSPITAL_COMMUNITY)
Admission: RE | Admit: 2019-10-27 | Discharge: 2019-10-27 | Disposition: A | Discharge status: Home / Self Care | Payer: Medicare Other | Source: Ambulatory Visit | Attending: Internal Medicine | Admitting: Internal Medicine

## 2019-10-27 DIAGNOSIS — Z85118 Personal history of other malignant neoplasm of bronchus and lung: Secondary | ICD-10-CM | POA: Insufficient documentation

## 2019-10-27 DIAGNOSIS — R918 Other nonspecific abnormal finding of lung field: Secondary | ICD-10-CM | POA: Insufficient documentation

## 2019-10-27 DIAGNOSIS — J90 Pleural effusion, not elsewhere classified: Secondary | ICD-10-CM | POA: Diagnosis not present

## 2019-10-27 DIAGNOSIS — C3490 Malignant neoplasm of unspecified part of unspecified bronchus or lung: Secondary | ICD-10-CM

## 2019-10-27 DIAGNOSIS — Z902 Acquired absence of lung [part of]: Secondary | ICD-10-CM | POA: Insufficient documentation

## 2019-10-27 LAB — CBC WITH DIFFERENTIAL (CANCER CENTER ONLY)
Abs Immature Granulocytes: 0.04 10*3/uL (ref 0.00–0.07)
Basophils Absolute: 0 10*3/uL (ref 0.0–0.1)
Basophils Relative: 0 %
Eosinophils Absolute: 0.1 10*3/uL (ref 0.0–0.5)
Eosinophils Relative: 2 %
HCT: 31.8 % — ABNORMAL LOW (ref 36.0–46.0)
Hemoglobin: 10.1 g/dL — ABNORMAL LOW (ref 12.0–15.0)
Immature Granulocytes: 0 %
Lymphocytes Relative: 18 %
Lymphs Abs: 1.7 10*3/uL (ref 0.7–4.0)
MCH: 29.5 pg (ref 26.0–34.0)
MCHC: 31.8 g/dL (ref 30.0–36.0)
MCV: 93 fL (ref 80.0–100.0)
Monocytes Absolute: 0.6 10*3/uL (ref 0.1–1.0)
Monocytes Relative: 7 %
Neutro Abs: 6.9 10*3/uL (ref 1.7–7.7)
Neutrophils Relative %: 73 %
Platelet Count: 243 10*3/uL (ref 150–400)
RBC: 3.42 MIL/uL — ABNORMAL LOW (ref 3.87–5.11)
RDW: 17.1 % — ABNORMAL HIGH (ref 11.5–15.5)
WBC Count: 9.5 10*3/uL (ref 4.0–10.5)
nRBC: 0 % (ref 0.0–0.2)

## 2019-10-27 LAB — CMP (CANCER CENTER ONLY)
ALT: 16 U/L (ref 0–44)
AST: 20 U/L (ref 15–41)
Albumin: 3 g/dL — ABNORMAL LOW (ref 3.5–5.0)
Alkaline Phosphatase: 94 U/L (ref 38–126)
Anion gap: 10 (ref 5–15)
BUN: 40 mg/dL — ABNORMAL HIGH (ref 8–23)
CO2: 35 mmol/L — ABNORMAL HIGH (ref 22–32)
Calcium: 10 mg/dL (ref 8.9–10.3)
Chloride: 94 mmol/L — ABNORMAL LOW (ref 98–111)
Creatinine: 1.63 mg/dL — ABNORMAL HIGH (ref 0.44–1.00)
GFR, Est AFR Am: 37 mL/min — ABNORMAL LOW (ref >60)
GFR, Estimated: 32 mL/min — ABNORMAL LOW (ref >60)
Glucose, Bld: 109 mg/dL — ABNORMAL HIGH (ref 70–99)
Potassium: 3.8 mmol/L (ref 3.5–5.1)
Sodium: 139 mmol/L (ref 135–145)
Total Bilirubin: <0.2 mg/dL — ABNORMAL LOW (ref 0.3–1.2)
Total Protein: 6.8 g/dL (ref 6.5–8.1)

## 2019-10-30 ENCOUNTER — Other Ambulatory Visit: Payer: Self-pay

## 2019-10-30 ENCOUNTER — Inpatient Hospital Stay: Payer: Medicare Other | Attending: Internal Medicine | Admitting: Internal Medicine

## 2019-10-30 ENCOUNTER — Encounter: Payer: Self-pay | Admitting: Internal Medicine

## 2019-10-30 VITALS — BP 123/76 | HR 64 | Temp 97.5°F | Resp 18 | Ht 61.0 in | Wt 141.6 lb

## 2019-10-30 DIAGNOSIS — C3431 Malignant neoplasm of lower lobe, right bronchus or lung: Secondary | ICD-10-CM | POA: Diagnosis not present

## 2019-10-30 DIAGNOSIS — I2510 Atherosclerotic heart disease of native coronary artery without angina pectoris: Secondary | ICD-10-CM

## 2019-10-30 DIAGNOSIS — C3491 Malignant neoplasm of unspecified part of right bronchus or lung: Secondary | ICD-10-CM

## 2019-10-30 DIAGNOSIS — C3490 Malignant neoplasm of unspecified part of unspecified bronchus or lung: Secondary | ICD-10-CM

## 2019-10-30 NOTE — Progress Notes (Signed)
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  PRIOR THERAPY: Status post right lower lobectomy with lymph node dissection on April 04, 2018 under the care of Dr. Roxan Hockey.  CURRENT THERAPY: Observation.  INTERVAL HISTORY: Maria Arroyo 69 y.o. female returns to the clinic today for follow-up visit.  The patient is feeling fine today with no concerning complaints.  She denied having any chest pain, shortness of breath, cough or hemoptysis.  She denied having any fever or chills.  She has no nausea, vomiting, diarrhea or constipation.  She denied having any headache or visual changes.  She had repeat CT scan of the chest performed recently and she is here for evaluation and discussion of her scan results.  MEDICAL HISTORY: Past Medical History:  Diagnosis Date  . Cancer ([place])    lung  . Cervical disc disease   . 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 COPD (chronic obstructive pulmonary disease) ([place])   . Coronary artery disease   . Depression   . Gout    R foot and knee  . Hyperlipidemia   . Hypertension   . Left foot drop   . Neuropathy   . OA (osteoarthritis)   . Pica   . Tobacco abuse     ALLERGIES:  is allergic to ace inhibitors, codeine, and codeine-guaifenesin [guaifenesin-codeine].  MEDICATIONS:  Current Outpatient Medications  Medication Sig Dispense Refill  . acetaminophen (TYLENOL) 500 MG tablet Take 2 tablets (1,000 mg total) by mouth every 6 (six) hours. 30 tablet 0  . allopurinol (ZYLOPRIM) 100 MG tablet Take 100 mg by mouth daily.     [name] amiodarone (PACERONE) 200 MG tablet Take 1 tablet (200 mg total) by mouth daily. 90 tablet 3  . apixaban (ELIQUIS) 5 MG TABS tablet  Take 1 tablet (5 mg total) by mouth 2 (two) times daily. 60 tablet 1  . Ascorbic Acid (VITAMIN C PO) Take 1,000 mg by mouth daily.    . bisoprolol (ZEBETA) 5 MG tablet Take 0.5 tablets (2.5 mg total) by mouth daily. 15 tablet 8  . busPIRone (BUSPAR) 15 MG tablet Take 15 mg by mouth 2 (two) times daily.    . Calcium Carbonate-Vitamin D (CALCIUM 600 + D PO) Take 1 tablet by mouth daily.     . carisoprodol (SOMA) 350 MG tablet Take 1 tablet by mouth 4 (four) times daily as needed for muscle spasms.    . Cholecalciferol (VITAMIN D) 2000 UNITS tablet Take 4,000 Units by mouth daily.     [name] gabapentin (NEURONTIN) 600 MG tablet TAKE 1 TABLET(600 MG) BY MOUTH FOUR TIMES DAILY AT 8 AM AND AT 1 PM AND AT 6 PM AND AT 10 PM 360 tablet 3  . indomethacin (INDOCIN SR) 75 MG CR capsule     . Multiple Vitamin (MULTIVITAMIN WITH MINERALS) TABS tablet Take 1 tablet by mouth daily. 30 tablet 0  . nicotine (NICODERM CQ - DOSED IN MG/24 HOURS) 21 mg/24hr patch Place 1 patch (21 mg total) onto the skin daily. 28 patch 0  . nitroGLYCERIN (NITROSTAT) 0.4 MG SL tablet Place 1 tablet (0.4 mg total) under the tongue every 5 (five) minutes as needed. For chest pain (Patient taking differently: Place 0.4 mg  under the tongue every 5 (five) minutes as needed for chest pain. ) 100 tablet 0  . oxyCODONE-acetaminophen (PERCOCET) 10-325 MG tablet Take 1 tablet by mouth 4 (four) times daily as needed for pain.    . rosuvastatin (CRESTOR) 40 MG tablet Take 1 tablet (40 mg total) by mouth daily. 90 tablet 3  . torsemide (DEMADEX) 20 MG tablet TAKE 1 TABLET BY MOUTH DAILY    . traZODone (DESYREL) 50 MG tablet     . valACYclovir (VALTREX) 1000 MG tablet     . venlafaxine XR (EFFEXOR-XR) 75 MG 24 hr capsule Take 75 mg by mouth 2 (two) times daily.   0   No current facility-administered medications for this visit.    SURGICAL HISTORY:  Past Surgical History:  Procedure Laterality Date  . ABDOMINAL HYSTERECTOMY    . ANTERIOR CERVICAL  DECOMP/DISCECTOMY FUSION  02/06/2011   Procedure: ANTERIOR CERVICAL DECOMPRESSION/DISCECTOMY FUSION 2 LEVELS;  Surgeon: Winfield Cunas;  Location: [facility];  Service: Neurosurgery;  Laterality: N/A;  Anterior Cervical Four-Five/Five-Six Decompression and Fusion with Plating and Bonegraft  . BLADDER SURGERY    . CARDIAC CATHETERIZATION  04/09/2009   EF 60%  . CARDIAC CATHETERIZATION  03/24/2001   EF 55%  . CARDIAC CATHETERIZATION  01/06/2001   EF 65%  . CHOLECYSTECTOMY    . CORONARY STENT PLACEMENT  03/2009   STENTING OF THE PROXIMAL TO MID RIGHT CORONARY  . IR THORACENTESIS ASP PLEURAL SPACE W/IMG GUIDE  06/01/2018  . LEFT HEART CATHETERIZATION WITH CORONARY ANGIOGRAM N/A 12/21/2011   Procedure: LEFT HEART CATHETERIZATION WITH CORONARY ANGIOGRAM;  Surgeon: Peter M Martinique, MD;  Location: [facility] CATH LAB;  Service: Cardiovascular;  Laterality: N/A;  . LOBECTOMY    . NODE DISSECTION N/A 04/04/2018   Procedure: NODE DISSECTION;  Surgeon: Melrose Nakayama, MD;  Location: [facility];  Service: Thoracic;  Laterality: N/A;  . OTHER SURGICAL HISTORY  12/2014   R foot cyst removal  . VIDEO ASSISTED THORACOSCOPY (VATS)/ LOBECTOMY Right 04/04/2018   Procedure: VIDEO ASSISTED THORACOSCOPY (VATS)/RIGHT LOWER LOBECTOMY;  Surgeon: Melrose Nakayama, MD;  Location: [facility];  Service: Thoracic;  Laterality: Right;    REVIEW OF SYSTEMS:  A comprehensive review of systems was negative except for: Respiratory: positive for dyspnea on exertion   PHYSICAL EXAMINATION: General appearance: alert, cooperative, fatigued and no distress Head: Normocephalic, without obvious abnormality, atraumatic Neck: no adenopathy, no JVD, supple, symmetrical, trachea midline and thyroid not enlarged, symmetric, no tenderness/mass/nodules Lymph nodes: Cervical, supraclavicular, and axillary nodes normal. Resp: clear to auscultation bilaterally Back: symmetric, no curvature. ROM normal. No CVA tenderness. Cardio: regular rate and  rhythm, S1, S2 normal, no murmur, click, rub or gallop GI: soft, non-tender; bowel sounds normal; no masses,  no organomegaly Extremities: extremities normal, atraumatic, no cyanosis or edema  ECOG PERFORMANCE STATUS: 1 - Symptomatic but completely ambulatory  Blood pressure 123/76, pulse 64, temperature (!) 97.5 F (36.4 C), temperature source Temporal, resp. rate 18, height 5\' 1"  (1.549 m), weight 141 lb 9.6 oz (64.2 kg), SpO2 95 %.  LABORATORY DATA: Lab Results  Component Value Date   WBC 9.5 10/27/2019   HGB 10.1 (L) 10/27/2019   HCT 31.8 (L) 10/27/2019   MCV 93.0 10/27/2019   PLT 243 10/27/2019      Chemistry      Component Value Date/Time   NA 139 10/27/2019 1216   NA 143 05/03/2018 1530   K 3.8 10/27/2019 1216   CL 94 (L) 10/27/2019  1216   CO2 35 (H) 10/27/2019 1216   BUN 40 (H) 10/27/2019 1216   BUN 20 05/03/2018 1530   CREATININE 1.63 (H) 10/27/2019 1216   CREATININE 0.86 12/18/2011 1627      Component Value Date/Time   CALCIUM 10.0 10/27/2019 1216   ALKPHOS 94 10/27/2019 1216   AST 20 10/27/2019 1216   ALT 16 10/27/2019 1216   BILITOT <0.2 (L) 10/27/2019 1216       RADIOGRAPHIC STUDIES: CT Chest Wo Contrast  Result Date: 10/28/2019 CLINICAL DATA:  Non-small cell lung cancer staging. Six status post RIGHT lower lobectomy and lymph node dissection on April 04, 2018 EXAM: CT CHEST WITHOUT CONTRAST TECHNIQUE: Multidetector CT imaging of the chest was performed following the standard protocol without IV contrast. COMPARISON:  April 28, 2019 FINDINGS: Cardiovascular: Calcified atheromatous plaque of the thoracic aorta. Three-vessel coronary artery disease. Signs of prior percutaneous coronary intervention. No pericardial effusion. Central pulmonary vasculature remains dilated to 3.5 cm. Mediastinum/Nodes: Thoracic inlet structures are normal. No axillary lymphadenopathy. No gross hilar adenopathy. Distortion of RIGHT hilum following RIGHT lower lobectomy. Esophagus  normal. Lungs/Pleura: Postoperative changes in the RIGHT chest as before. The moderate size RIGHT pleural effusion shows a similar volume. Background pulmonary emphysema worse at the lung apices unchanged from the prior exam. Small pulmonary nodules without change in the RIGHT upper lobe, largest on image 25 of series 7 this measures 5 mm. Subtle ground-glass in the anterior LEFT chest has resolved. Airways are patent. Upper Abdomen: Post cholecystectomy. Stable benign-appearing adrenal form thickening of bilateral adrenal glands. Renal vascular calcifications and presumed LEFT renal cyst. No acute upper abdominal process. Musculoskeletal: Osteopenia. Spinal degenerative changes. Signs of prior cervical spinal fusion incompletely imaged. No acute or destructive bone process. IMPRESSION: 1. Stable postoperative changes in the RIGHT chest following RIGHT lower lobectomy. 2. Stable moderate size RIGHT pleural effusion. 3. Stable small pulmonary nodules in the RIGHT upper lobe, largest on image 25 of series 7 this measures 5 mm. Attention on follow-up. 4. Subtle ground-glass in the anterior LEFT chest has resolved. 5. Stable benign-appearing adreniform thickening. 6. Three-vessel coronary artery disease. Signs of prior percutaneous coronary intervention. 7. Emphysema and aortic atherosclerosis. Aortic Atherosclerosis (ICD10-I70.0) and Emphysema (ICD10-J43.9). Electronically Signed   By: Zetta Bills M.D.   On: 10/28/2019 15:08    ASSESSMENT AND PLAN: This is a very pleasant 69 years old white female with a stage Ib non-small cell lung cancer, poorly differentiated squamous cell carcinoma status post right lower lobectomy with lymph node dissection on April 04, 2018 under the care of Dr. Roxan Hockey. The patient is currently on observation and she is feeling fine. She had repeat CT scan of the chest performed recently.  I personally and independently reviewed the scans and discussed the results with the patient  today. Her scan showed no concerning findings for disease recurrence or metastasis. I recommended for her to continue on observation with repeat CT scan of the chest in 6 months. She was advised to call immediately if she has any concerning symptoms in the interval. The patient voices understanding of current disease status and treatment options and is in agreement with the current care plan. All questions were answered. The patient knows to call the clinic with any problems, questions or concerns. We can certainly see the patient much sooner if necessary.   Disclaimer: This note was dictated with voice recognition software. Similar sounding words can inadvertently be transcribed and may not be corrected upon review.

## 2019-11-01 ENCOUNTER — Telehealth: Payer: Self-pay | Admitting: Internal Medicine

## 2019-11-01 NOTE — Telephone Encounter (Signed)
Scheduled per los. Called, not able to leave msg. Mailed pirntout

## 2019-11-14 IMAGING — CR DG CHEST 2V
2 series · 2 of 2 positions shown · non-contrast
Comparison: 04/19/2018

CLINICAL DATA: Pt c/o SHOB AND decreased breathe on right side. Hx
of VATS 04/04/2018, COPD, CAD, HTN, current smoker.

EXAM:
CHEST - 2 VIEW

[w chest pa]
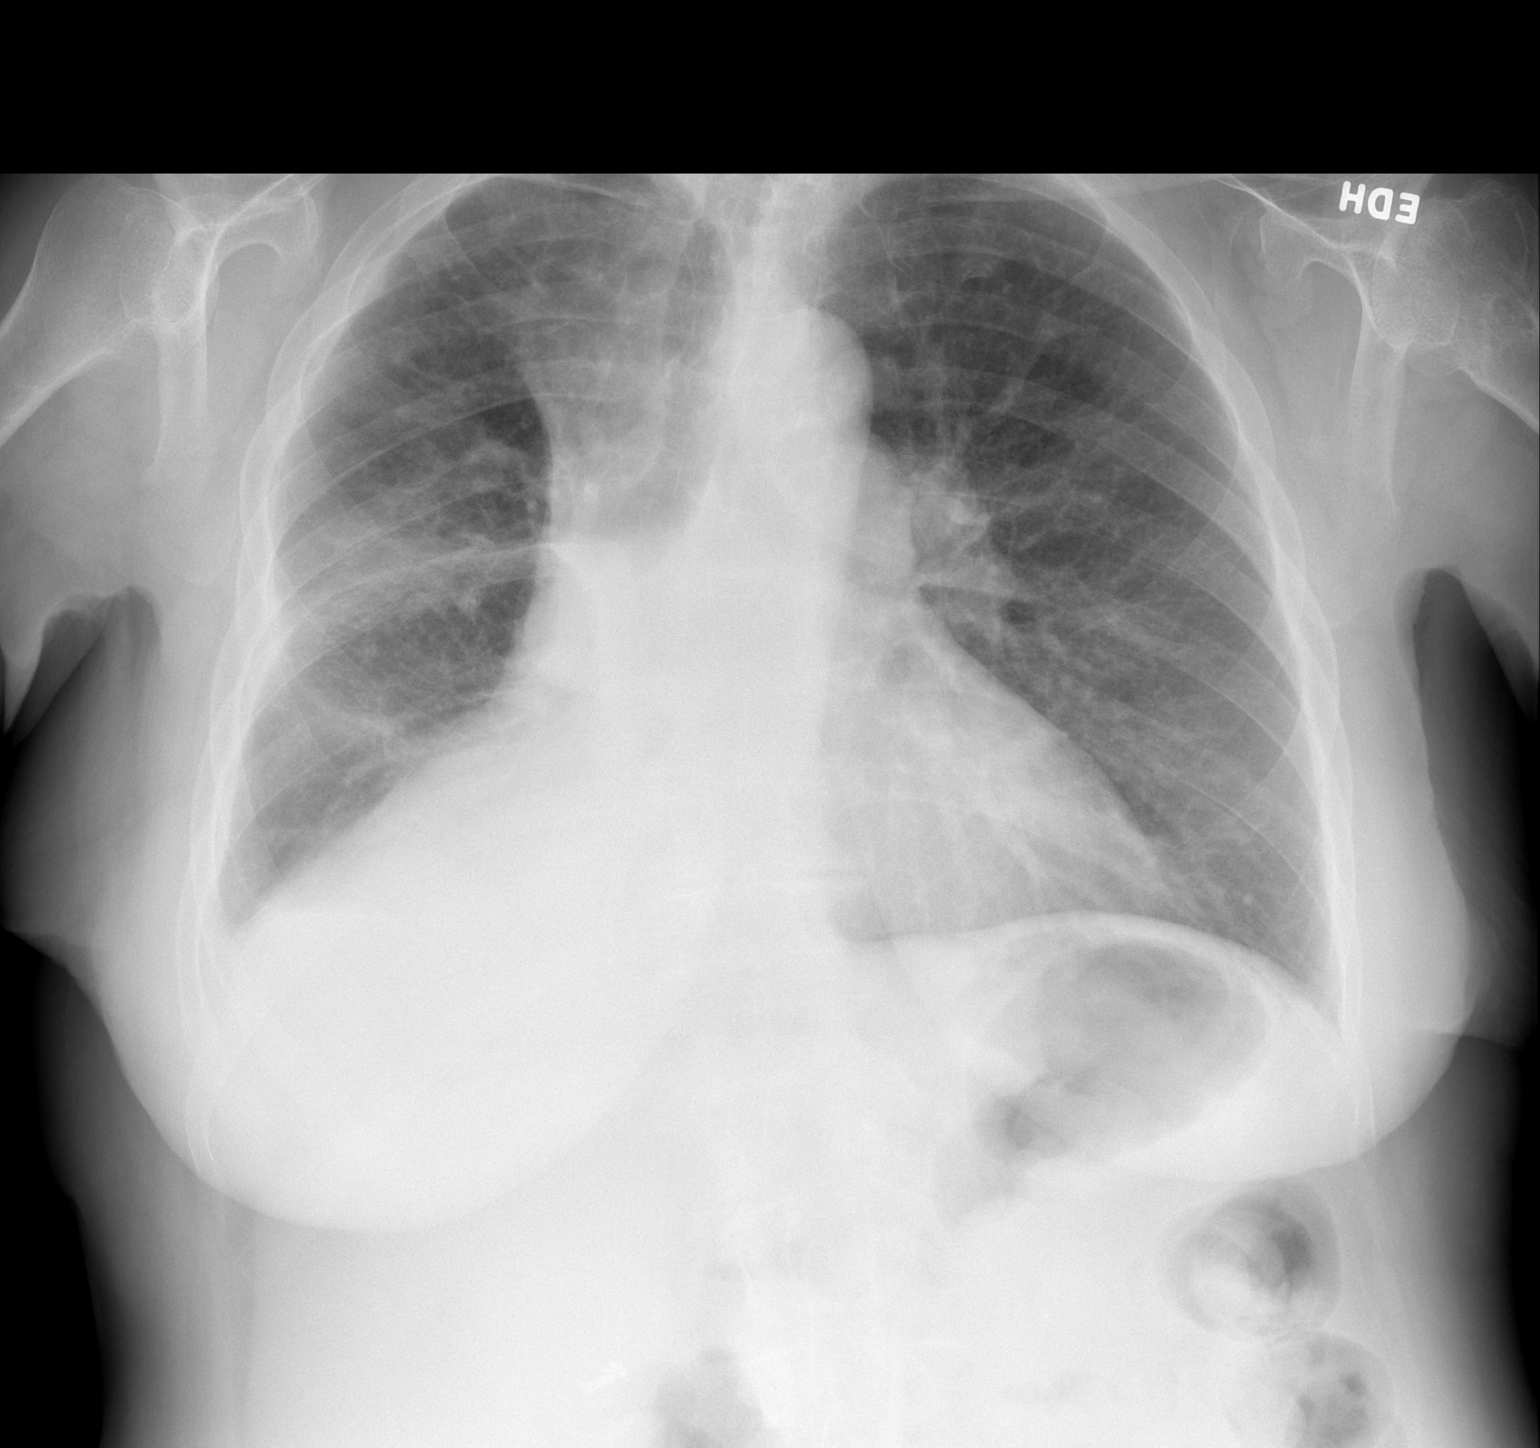

[w chest lat]
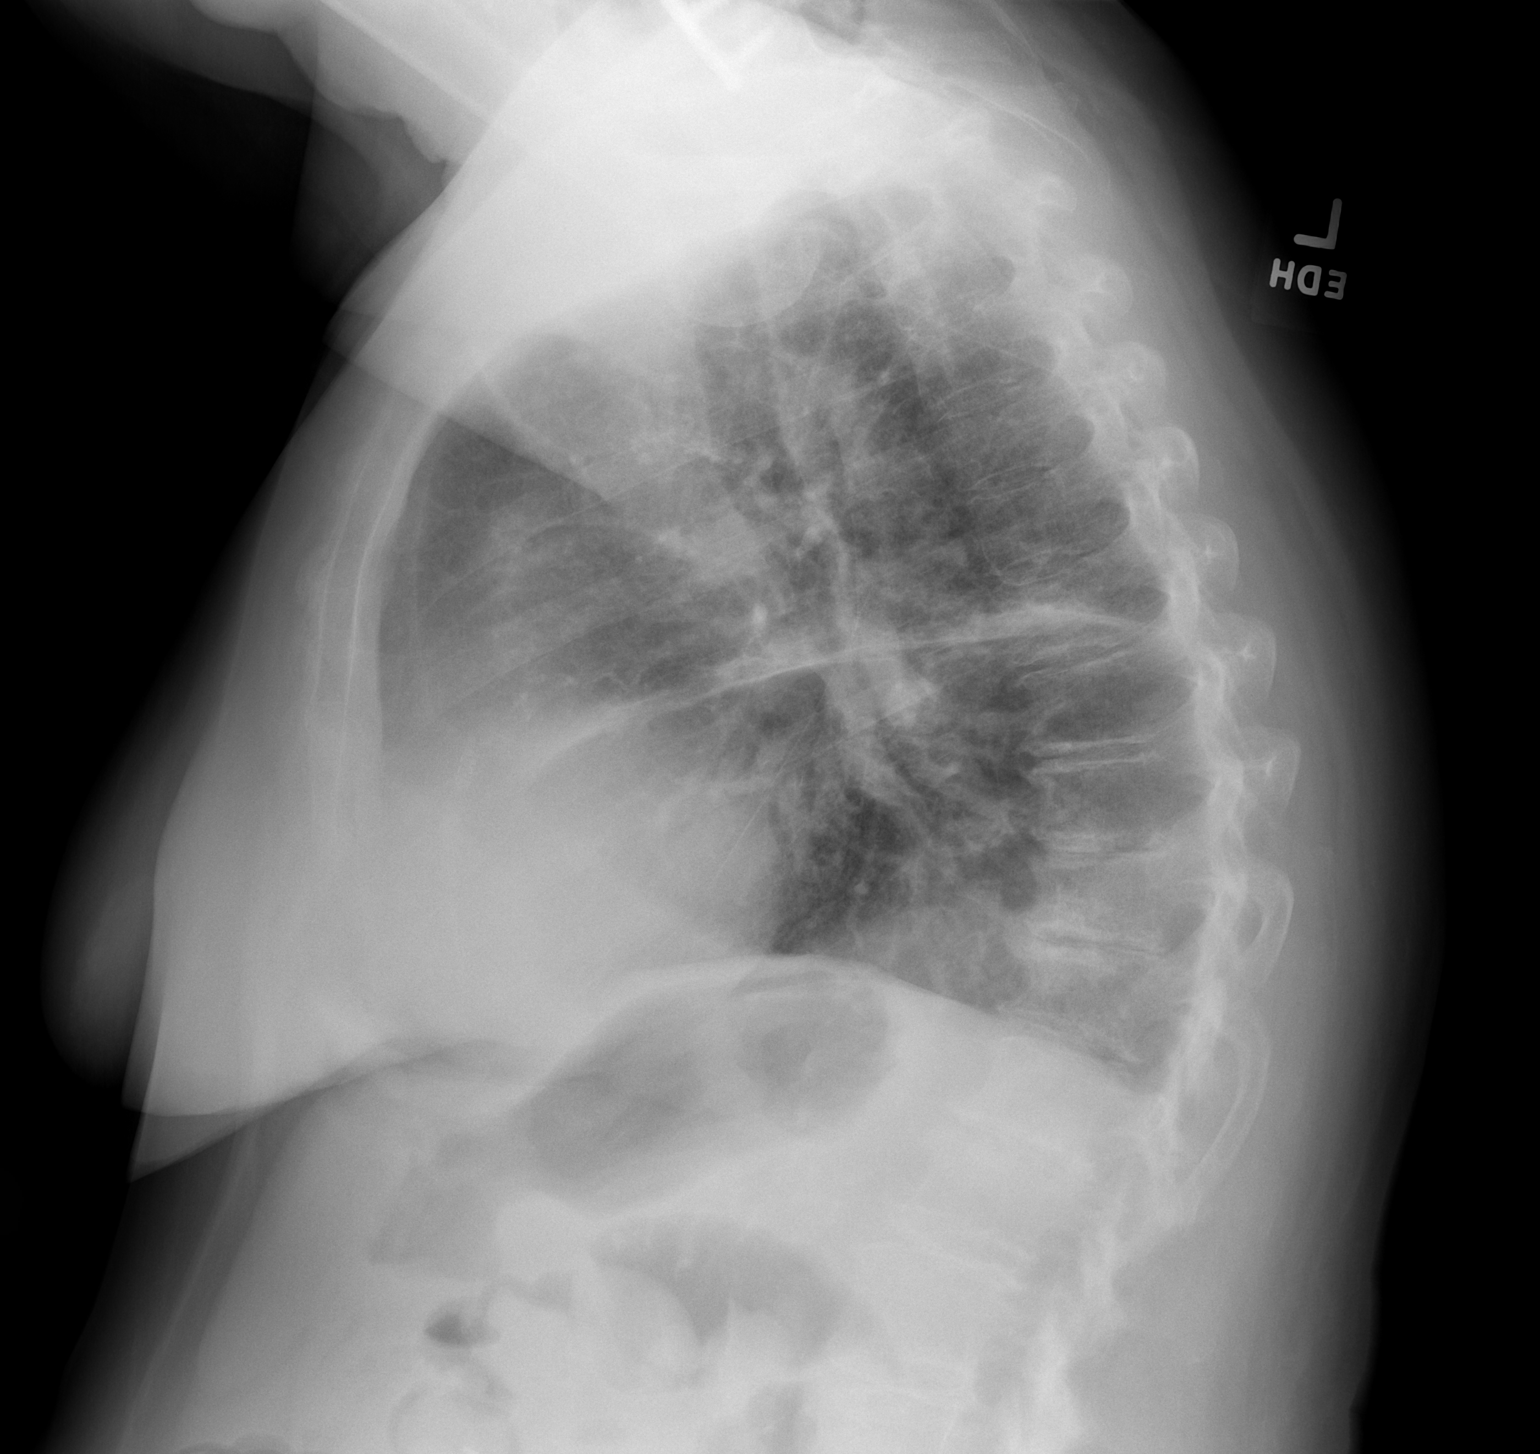

[2 of 2 positions shown; findings below may reference images not displayed]

FINDINGS: Heart size is probably normal and unchanged. There is significant
volume loss or consolidation at the RIGHT lung base, stable in
appearance. Stable RIGHT effusion. Small amount of fluid is
identified in the minor fissure and there is focal opacity along the
fissure within the LEFT UPPER lobe, consistent with mild infiltrate
or atelectasis.
IMPRESSION: 1. Significant stable opacity at the RIGHT lung base.
2. RIGHT UPPER lobe opacity consistent with atelectasis or
infiltrate.

## 2020-03-03 IMAGING — DX PORTABLE CHEST - 1 VIEW
1 series · 1 of 1 positions shown · non-contrast
Comparison: 08/26/2018 chest radiograph.

CLINICAL DATA: Dyspnea, pleural effusion

EXAM:
PORTABLE CHEST 1 VIEW

[chest ap]
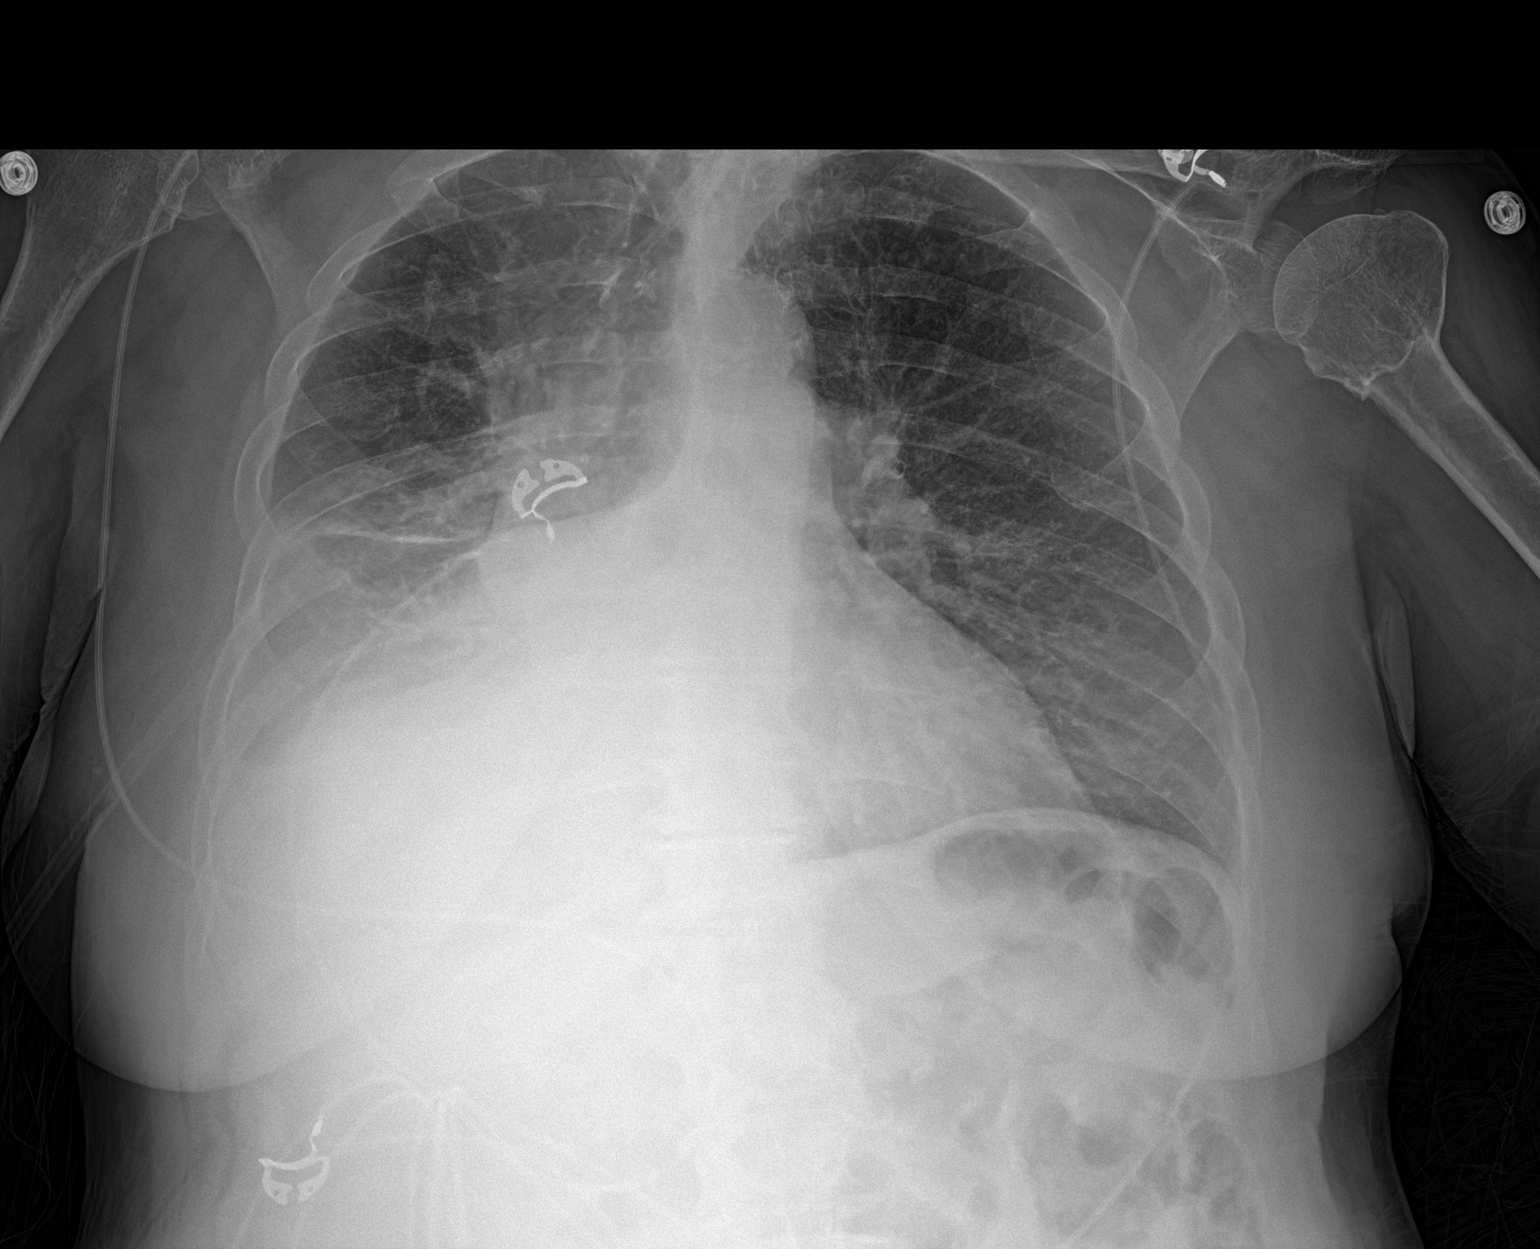

[1 of 1 positions shown; findings below may reference images not displayed]

FINDINGS: Surgical hardware from ACDF partially visualized overlying the lower
cervical spine. Stable cardiomediastinal silhouette with normal
heart size. No pneumothorax. Small right pleural effusion, similar.
No left pleural effusion. No overt pulmonary edema. Stable patchy
right lung base opacity.
IMPRESSION: Similar small right pleural effusion and patchy right lung base
opacity.

## 2020-04-23 ENCOUNTER — Other Ambulatory Visit: Payer: Self-pay | Admitting: Cardiology

## 2020-04-23 DIAGNOSIS — I2510 Atherosclerotic heart disease of native coronary artery without angina pectoris: Secondary | ICD-10-CM

## 2020-04-29 ENCOUNTER — Ambulatory Visit (HOSPITAL_COMMUNITY)
Admission: RE | Admit: 2020-04-29 | Discharge: 2020-04-29 | Disposition: A | Discharge status: Home / Self Care | Payer: Medicare Other | Source: Ambulatory Visit | Attending: Internal Medicine | Admitting: Internal Medicine

## 2020-04-29 ENCOUNTER — Inpatient Hospital Stay: Payer: Medicare Other | Attending: Internal Medicine

## 2020-04-29 ENCOUNTER — Other Ambulatory Visit: Payer: Self-pay

## 2020-04-29 DIAGNOSIS — C3490 Malignant neoplasm of unspecified part of unspecified bronchus or lung: Secondary | ICD-10-CM | POA: Insufficient documentation

## 2020-04-29 DIAGNOSIS — C3431 Malignant neoplasm of lower lobe, right bronchus or lung: Secondary | ICD-10-CM | POA: Insufficient documentation

## 2020-04-29 DIAGNOSIS — Z79899 Other long term (current) drug therapy: Secondary | ICD-10-CM | POA: Diagnosis not present

## 2020-04-29 LAB — CMP (CANCER CENTER ONLY)
ALT: 15 U/L (ref 0–44)
AST: 19 U/L (ref 15–41)
Albumin: 3.2 g/dL — ABNORMAL LOW (ref 3.5–5.0)
Alkaline Phosphatase: 87 U/L (ref 38–126)
Anion gap: 8 (ref 5–15)
BUN: 23 mg/dL (ref 8–23)
CO2: 36 mmol/L — ABNORMAL HIGH (ref 22–32)
Calcium: 9.6 mg/dL (ref 8.9–10.3)
Chloride: 94 mmol/L — ABNORMAL LOW (ref 98–111)
Creatinine: 1.65 mg/dL — ABNORMAL HIGH (ref 0.44–1.00)
GFR, Estimated: 33 mL/min — ABNORMAL LOW (ref >60)
Glucose, Bld: 120 mg/dL — ABNORMAL HIGH (ref 70–99)
Potassium: 4.1 mmol/L (ref 3.5–5.1)
Sodium: 138 mmol/L (ref 135–145)
Total Bilirubin: 0.2 mg/dL — ABNORMAL LOW (ref 0.3–1.2)
Total Protein: 6.9 g/dL (ref 6.5–8.1)

## 2020-04-29 LAB — CBC WITH DIFFERENTIAL (CANCER CENTER ONLY)
Abs Immature Granulocytes: 0.01 10*3/uL (ref 0.00–0.07)
Basophils Absolute: 0 10*3/uL (ref 0.0–0.1)
Basophils Relative: 1 %
Eosinophils Absolute: 0.1 10*3/uL (ref 0.0–0.5)
Eosinophils Relative: 2 %
HCT: 31.6 % — ABNORMAL LOW (ref 36.0–46.0)
Hemoglobin: 9.6 g/dL — ABNORMAL LOW (ref 12.0–15.0)
Immature Granulocytes: 0 %
Lymphocytes Relative: 18 %
Lymphs Abs: 1.5 10*3/uL (ref 0.7–4.0)
MCH: 28.4 pg (ref 26.0–34.0)
MCHC: 30.4 g/dL (ref 30.0–36.0)
MCV: 93.5 fL (ref 80.0–100.0)
Monocytes Absolute: 0.6 10*3/uL (ref 0.1–1.0)
Monocytes Relative: 8 %
Neutro Abs: 5.7 10*3/uL (ref 1.7–7.7)
Neutrophils Relative %: 71 %
Platelet Count: 246 10*3/uL (ref 150–400)
RBC: 3.38 MIL/uL — ABNORMAL LOW (ref 3.87–5.11)
RDW: 19.6 % — ABNORMAL HIGH (ref 11.5–15.5)
WBC Count: 8 10*3/uL (ref 4.0–10.5)
nRBC: 0 % (ref 0.0–0.2)

## 2020-04-29 MED ORDER — IOHEXOL 300 MG/ML  SOLN
75.0000 mL | Freq: Once | INTRAMUSCULAR | Status: AC | PRN
  Administered 2020-04-29: 60 mL via INTRAVENOUS

## 2020-05-01 ENCOUNTER — Other Ambulatory Visit: Payer: Self-pay | Admitting: Medical Oncology

## 2020-05-01 ENCOUNTER — Inpatient Hospital Stay: Payer: Medicare Other

## 2020-05-01 ENCOUNTER — Other Ambulatory Visit: Payer: Self-pay

## 2020-05-01 ENCOUNTER — Inpatient Hospital Stay: Payer: Medicare Other | Attending: Internal Medicine | Admitting: Internal Medicine

## 2020-05-01 ENCOUNTER — Encounter: Payer: Self-pay | Admitting: Internal Medicine

## 2020-05-01 VITALS — BP 116/61 | HR 60 | Temp 97.7°F | Resp 14 | Ht 61.0 in | Wt 143.6 lb

## 2020-05-01 DIAGNOSIS — C3490 Malignant neoplasm of unspecified part of unspecified bronchus or lung: Secondary | ICD-10-CM | POA: Diagnosis not present

## 2020-05-01 DIAGNOSIS — Z79899 Other long term (current) drug therapy: Secondary | ICD-10-CM | POA: Diagnosis not present

## 2020-05-01 DIAGNOSIS — C3491 Malignant neoplasm of unspecified part of right bronchus or lung: Secondary | ICD-10-CM

## 2020-05-01 DIAGNOSIS — I2510 Atherosclerotic heart disease of native coronary artery without angina pectoris: Secondary | ICD-10-CM | POA: Diagnosis not present

## 2020-05-01 DIAGNOSIS — I10 Essential (primary) hypertension: Secondary | ICD-10-CM | POA: Insufficient documentation

## 2020-05-01 DIAGNOSIS — Z85118 Personal history of other malignant neoplasm of bronchus and lung: Secondary | ICD-10-CM | POA: Diagnosis present

## 2020-05-01 NOTE — Progress Notes (Signed)
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  PRIOR THERAPY: Status post right lower lobectomy with lymph node dissection on April 04, 2018 under the care of Dr. Roxan Hockey.  CURRENT THERAPY: Observation.  INTERVAL HISTORY: Maria Arroyo 70 y.o. female returns to the clinic today for follow-up visit.  The patient is feeling fine today with no concerning complaints except for insomnia.  She denied having any current chest pain, shortness of breath, cough or hemoptysis.  She denied having any nausea, vomiting, diarrhea or constipation.  She denied having any headache or visual changes.  She is currently on observation and had repeat CT scan of the chest performed recently and she is here for evaluation and discussion of her risk her results.  MEDICAL HISTORY: Past Medical History:  Diagnosis Date  . Cancer ([facility])    lung  . Cervical disc disease   . 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 COPD (chronic obstructive pulmonary disease) ([employer])   . Coronary artery disease   . Depression   . Gout    R foot and knee  . Hyperlipidemia   . Hypertension   . Left foot drop   . Neuropathy   . OA (osteoarthritis)   . Pica   . Tobacco abuse     ALLERGIES:  is allergic to ace inhibitors, codeine, and codeine-guaifenesin [guaifenesin-codeine].  MEDICATIONS:  Current Outpatient Medications  Medication Sig Dispense Refill  . acetaminophen (TYLENOL) 500 MG tablet Take 2 tablets (1,000 mg total) by mouth every 6 (six) hours. 30 tablet 0  . allopurinol (ZYLOPRIM) 100 MG tablet Take 100 mg by mouth daily.     [name] amiodarone (PACERONE) 200 MG tablet Take 1 tablet (200 mg total) by mouth daily. 90 tablet 3  .  apixaban (ELIQUIS) 5 MG TABS tablet Take 1 tablet (5 mg total) by mouth 2 (two) times daily. 60 tablet 1  . Ascorbic Acid (VITAMIN C PO) Take 1,000 mg by mouth daily.    . bisoprolol (ZEBETA) 5 MG tablet Take 0.5 tablets (2.5 mg total) by mouth daily. 15 tablet 8  . busPIRone (BUSPAR) 15 MG tablet Take 15 mg by mouth 2 (two) times daily.    . Calcium Carbonate-Vitamin D (CALCIUM 600 + D PO) Take 1 tablet by mouth daily.     . carisoprodol (SOMA) 350 MG tablet Take 1 tablet by mouth 4 (four) times daily as needed for muscle spasms.    . Cholecalciferol (VITAMIN D) 2000 UNITS tablet Take 4,000 Units by mouth daily.     [name] gabapentin (NEURONTIN) 600 MG tablet TAKE 1 TABLET(600 MG) BY MOUTH FOUR TIMES DAILY AT 8 AM AND AT 1 PM AND AT 6 PM AND AT 10 PM 360 tablet 3  . indomethacin (INDOCIN SR) 75 MG CR capsule     . Multiple Vitamin (MULTIVITAMIN WITH MINERALS) TABS tablet Take 1 tablet by mouth daily. 30 tablet 0  . nicotine (NICODERM CQ - DOSED IN MG/24 HOURS) 21 mg/24hr patch Place 1 patch (21 mg total) onto the skin daily. 28 patch 0  . nitroGLYCERIN (NITROSTAT) 0.4 MG SL tablet Place 1 tablet (0.4 mg total) under the tongue every 5 (five) minutes as needed. For chest pain (Patient taking differently:  Place 0.4 mg under the tongue every 5 (five) minutes as needed for chest pain. ) 100 tablet 0  . oxyCODONE-acetaminophen (PERCOCET) 10-325 MG tablet Take 1 tablet by mouth 4 (four) times daily as needed for pain.    . rosuvastatin (CRESTOR) 40 MG tablet TAKE 1 TABLET(40 MG) BY MOUTH DAILY 90 tablet 3  . torsemide (DEMADEX) 20 MG tablet TAKE 1 TABLET BY MOUTH DAILY    . traZODone (DESYREL) 50 MG tablet     . valACYclovir (VALTREX) 1000 MG tablet     . venlafaxine XR (EFFEXOR-XR) 75 MG 24 hr capsule Take 75 mg by mouth 2 (two) times daily.   0   No current facility-administered medications for this visit.    SURGICAL HISTORY:  Past Surgical History:  Procedure Laterality Date  . ABDOMINAL  HYSTERECTOMY    . ANTERIOR CERVICAL DECOMP/DISCECTOMY FUSION  02/06/2011   Procedure: ANTERIOR CERVICAL DECOMPRESSION/DISCECTOMY FUSION 2 LEVELS;  Surgeon: Winfield Cunas;  Location: [facility];  Service: Neurosurgery;  Laterality: N/A;  Anterior Cervical Four-Five/Five-Six Decompression and Fusion with Plating and Bonegraft  . BLADDER SURGERY    . CARDIAC CATHETERIZATION  04/09/2009   EF 60%  . CARDIAC CATHETERIZATION  03/24/2001   EF 55%  . CARDIAC CATHETERIZATION  01/06/2001   EF 65%  . CHOLECYSTECTOMY    . CORONARY STENT PLACEMENT  03/2009   STENTING OF THE PROXIMAL TO MID RIGHT CORONARY  . IR THORACENTESIS ASP PLEURAL SPACE W/IMG GUIDE  06/01/2018  . LEFT HEART CATHETERIZATION WITH CORONARY ANGIOGRAM N/A 12/21/2011   Procedure: LEFT HEART CATHETERIZATION WITH CORONARY ANGIOGRAM;  Surgeon: Peter M Martinique, MD;  Location: [facility] CATH [facility];  Service: Cardiovascular;  Laterality: N/A;  . LOBECTOMY    . NODE DISSECTION N/A 04/04/2018   Procedure: NODE DISSECTION;  Surgeon: Melrose Nakayama, MD;  Location: [facility];  Service: Thoracic;  Laterality: N/A;  . OTHER SURGICAL HISTORY  12/2014   R foot cyst removal  . VIDEO ASSISTED THORACOSCOPY (VATS)/ LOBECTOMY Right 04/04/2018   Procedure: VIDEO ASSISTED THORACOSCOPY (VATS)/RIGHT LOWER LOBECTOMY;  Surgeon: Melrose Nakayama, MD;  Location: [facility];  Service: Thoracic;  Laterality: Right;    REVIEW OF SYSTEMS:  A comprehensive review of systems was negative except for: Behavioral/Psych: positive for sleep disturbance   PHYSICAL EXAMINATION: General appearance: alert, cooperative and no distress Head: Normocephalic, without obvious abnormality, atraumatic Neck: no adenopathy, no JVD, supple, symmetrical, trachea midline and thyroid not enlarged, symmetric, no tenderness/mass/nodules Lymph nodes: Cervical, supraclavicular, and axillary nodes normal. Resp: clear to auscultation bilaterally Back: symmetric, no curvature. ROM normal. No CVA  tenderness. Cardio: regular rate and rhythm, S1, S2 normal, no murmur, click, rub or gallop GI: soft, non-tender; bowel sounds normal; no masses,  no organomegaly Extremities: extremities normal, atraumatic, no cyanosis or edema  ECOG PERFORMANCE STATUS: 1 - Symptomatic but completely ambulatory  Blood pressure 116/61, pulse 60, temperature 97.7 F (36.5 C), temperature source Tympanic, resp. rate 14, height 5\' 1"  (1.549 m), weight 143 lb 9.6 oz (65.1 kg), SpO2 98 %.  LABORATORY DATA: Lab Results  Component Value Date   WBC 8.0 04/29/2020   HGB 9.6 (L) 04/29/2020   HCT 31.6 (L) 04/29/2020   MCV 93.5 04/29/2020   PLT 246 04/29/2020      Chemistry      Component Value Date/Time   NA 138 04/29/2020 1231   NA 143 05/03/2018 1530   K 4.1 04/29/2020 1231   CL 94 (L) 04/29/2020 1231  CO2 36 (H) 04/29/2020 1231   BUN 23 04/29/2020 1231   BUN 20 05/03/2018 1530   CREATININE 1.65 (H) 04/29/2020 1231   CREATININE 0.86 12/18/2011 1627      Component Value Date/Time   CALCIUM 9.6 04/29/2020 1231   ALKPHOS 87 04/29/2020 1231   AST 19 04/29/2020 1231   ALT 15 04/29/2020 1231   BILITOT 0.2 (L) 04/29/2020 1231       RADIOGRAPHIC STUDIES: CT Chest W Contrast  Result Date: 04/29/2020 CLINICAL DATA:  History of non-small cell right lung cancer status post right lower lobectomy in January 2020. Restaging. EXAM: CT CHEST WITH CONTRAST TECHNIQUE: Multidetector CT imaging of the chest was performed during intravenous contrast administration. CONTRAST:  33mL OMNIPAQUE IOHEXOL 300 MG/ML  SOLN COMPARISON:  10/27/2019 chest CT. FINDINGS: Cardiovascular: Normal heart size. No significant pericardial effusion/thickening. Three-vessel coronary atherosclerosis. Atherosclerotic nonaneurysmal thoracic aorta. Top-normal caliber main pulmonary artery (3.2 cm diameter). No central pulmonary emboli. Mediastinum/Nodes: No discrete thyroid nodules. Unremarkable esophagus. No pathologically enlarged axillary,  mediastinal or hilar lymph nodes. Lungs/Pleura: No pneumothorax. Small dependent right pleural effusion, not appreciably changed. No left pleural effusion. Moderate centrilobular and paraseptal emphysema with mild diffuse bronchial wall thickening. Status post right lower lobectomy. No acute consolidative airspace disease or lung masses. A few previously visualized tiny peripheral right upper lobe pulmonary nodules measuring up to 4 mm (series 5/image 52), all stable. New tiny indistinct peripheral left lower lobe 3 mm nodule (series 5/image 63). No additional new significant pulmonary nodules. Upper abdomen: Cholecystectomy. Chronic asymmetric severe left renal atrophy with exophytic simple 2.0 cm anterior upper left renal cyst. Musculoskeletal: No aggressive appearing focal osseous lesions. Marked thoracic spondylosis. Partially visualized surgical hardware from ACDF. IMPRESSION: 1. New tiny indistinct 3 mm peripheral left lower lobe pulmonary nodule. Follow-up chest CT in 3 months recommended. 2. Otherwise stable chest CT with no additional potential findings of metastatic disease. 3. Stable small dependent right pleural effusion. 4. Three-vessel coronary atherosclerosis. 5. Chronic asymmetric severe left renal atrophy. 6. Aortic Atherosclerosis (ICD10-I70.0) and Emphysema (ICD10-J43.9). Electronically Signed   By: Ilona Sorrel M.D.   On: 04/29/2020 14:44    ASSESSMENT AND PLAN: This is a very pleasant 70 years old white female with a stage Ib non-small cell lung cancer, poorly differentiated squamous cell carcinoma status post right lower lobectomy with lymph node dissection on April 04, 2018 under the care of Dr. Roxan Hockey. The patient is currently on observation and she is feeling fine today with no concerning complaints. She had repeat CT scan of the chest performed recently.  I personally and independently reviewed the scans and discussed the results with the patient today and showed her the  images. Her scan showed no concerning findings for disease recurrence or metastasis but there was a new tiny 3 mm peripheral left lower lobe pulmonary nodule.  I recommended for the patient to continue on observation with repeat CT scan of the chest in 4 months for further evaluation of this nodule. The patient was advised to call immediately if she has any concerning symptoms in the interval. The patient voices understanding of current disease status and treatment options and is in agreement with the current care plan. All questions were answered. The patient knows to call the clinic with any problems, questions or concerns. We can certainly see the patient much sooner if necessary.   Disclaimer: This note was dictated with voice recognition software. Similar sounding words can inadvertently be transcribed and may not be corrected  upon review.

## 2020-05-02 ENCOUNTER — Telehealth: Payer: Self-pay | Admitting: Internal Medicine

## 2020-05-02 NOTE — Telephone Encounter (Signed)
Scheduled per 2/2 los. Called and spoke with pt, confirmed 2/1 and 2/2 appts

## 2020-07-19 ENCOUNTER — Other Ambulatory Visit: Payer: Self-pay | Admitting: Neurology

## 2020-07-22 ENCOUNTER — Other Ambulatory Visit: Payer: Self-pay | Admitting: Emergency Medicine

## 2020-07-25 ENCOUNTER — Other Ambulatory Visit: Payer: Self-pay | Admitting: Physician Assistant

## 2020-08-28 ENCOUNTER — Encounter (INDEPENDENT_AMBULATORY_CARE_PROVIDER_SITE_OTHER): Payer: Self-pay

## 2020-08-28 ENCOUNTER — Other Ambulatory Visit: Payer: Self-pay

## 2020-08-28 ENCOUNTER — Inpatient Hospital Stay: Payer: Medicare Other | Attending: Internal Medicine

## 2020-08-28 ENCOUNTER — Ambulatory Visit (HOSPITAL_COMMUNITY)
Admission: RE | Admit: 2020-08-28 | Discharge: 2020-08-28 | Disposition: A | Discharge status: Home / Self Care | Payer: Medicare Other | Source: Ambulatory Visit | Attending: Internal Medicine | Admitting: Internal Medicine

## 2020-08-28 ENCOUNTER — Ambulatory Visit: Payer: Medicare Other | Admitting: Internal Medicine

## 2020-08-28 DIAGNOSIS — C3431 Malignant neoplasm of lower lobe, right bronchus or lung: Secondary | ICD-10-CM | POA: Insufficient documentation

## 2020-08-28 DIAGNOSIS — C3490 Malignant neoplasm of unspecified part of unspecified bronchus or lung: Secondary | ICD-10-CM

## 2020-08-28 DIAGNOSIS — Z79899 Other long term (current) drug therapy: Secondary | ICD-10-CM | POA: Insufficient documentation

## 2020-08-28 DIAGNOSIS — N289 Disorder of kidney and ureter, unspecified: Secondary | ICD-10-CM | POA: Insufficient documentation

## 2020-08-28 LAB — CBC WITH DIFFERENTIAL (CANCER CENTER ONLY)
Abs Immature Granulocytes: 0.03 10*3/uL (ref 0.00–0.07)
Basophils Absolute: 0.1 10*3/uL (ref 0.0–0.1)
Basophils Relative: 1 %
Eosinophils Absolute: 0.2 10*3/uL (ref 0.0–0.5)
Eosinophils Relative: 2 %
HCT: 32.6 % — ABNORMAL LOW (ref 36.0–46.0)
Hemoglobin: 10.3 g/dL — ABNORMAL LOW (ref 12.0–15.0)
Immature Granulocytes: 0 %
Lymphocytes Relative: 20 %
Lymphs Abs: 1.9 10*3/uL (ref 0.7–4.0)
MCH: 30.9 pg (ref 26.0–34.0)
MCHC: 31.6 g/dL (ref 30.0–36.0)
MCV: 97.9 fL (ref 80.0–100.0)
Monocytes Absolute: 0.8 10*3/uL (ref 0.1–1.0)
Monocytes Relative: 8 %
Neutro Abs: 6.7 10*3/uL (ref 1.7–7.7)
Neutrophils Relative %: 69 %
Platelet Count: 215 10*3/uL (ref 150–400)
RBC: 3.33 MIL/uL — ABNORMAL LOW (ref 3.87–5.11)
RDW: 13.4 % (ref 11.5–15.5)
WBC Count: 9.6 10*3/uL (ref 4.0–10.5)
nRBC: 0 % (ref 0.0–0.2)

## 2020-08-28 LAB — CMP (CANCER CENTER ONLY)
ALT: 13 U/L (ref 0–44)
AST: 18 U/L (ref 15–41)
Albumin: 3.4 g/dL — ABNORMAL LOW (ref 3.5–5.0)
Alkaline Phosphatase: 95 U/L (ref 38–126)
Anion gap: 9 (ref 5–15)
BUN: 39 mg/dL — ABNORMAL HIGH (ref 8–23)
CO2: 33 mmol/L — ABNORMAL HIGH (ref 22–32)
Calcium: 9.3 mg/dL (ref 8.9–10.3)
Chloride: 99 mmol/L (ref 98–111)
Creatinine: 1.98 mg/dL — ABNORMAL HIGH (ref 0.44–1.00)
GFR, Estimated: 27 mL/min — ABNORMAL LOW (ref >60)
Glucose, Bld: 115 mg/dL — ABNORMAL HIGH (ref 70–99)
Potassium: 4.2 mmol/L (ref 3.5–5.1)
Sodium: 141 mmol/L (ref 135–145)
Total Bilirubin: <0.2 mg/dL — ABNORMAL LOW (ref 0.3–1.2)
Total Protein: 7 g/dL (ref 6.5–8.1)

## 2020-08-29 ENCOUNTER — Inpatient Hospital Stay (HOSPITAL_BASED_OUTPATIENT_CLINIC_OR_DEPARTMENT_OTHER): Payer: Medicare Other | Admitting: Internal Medicine

## 2020-08-29 ENCOUNTER — Other Ambulatory Visit: Payer: Self-pay

## 2020-08-29 ENCOUNTER — Encounter: Payer: Self-pay | Admitting: Internal Medicine

## 2020-08-29 VITALS — BP 148/60 | HR 59 | Temp 97.8°F | Resp 20 | Ht 61.0 in | Wt 155.2 lb

## 2020-08-29 DIAGNOSIS — C3490 Malignant neoplasm of unspecified part of unspecified bronchus or lung: Secondary | ICD-10-CM

## 2020-08-29 DIAGNOSIS — C3491 Malignant neoplasm of unspecified part of right bronchus or lung: Secondary | ICD-10-CM | POA: Diagnosis not present

## 2020-08-29 DIAGNOSIS — C3431 Malignant neoplasm of lower lobe, right bronchus or lung: Secondary | ICD-10-CM | POA: Diagnosis present

## 2020-08-29 DIAGNOSIS — N289 Disorder of kidney and ureter, unspecified: Secondary | ICD-10-CM | POA: Diagnosis not present

## 2020-08-29 DIAGNOSIS — Z79899 Other long term (current) drug therapy: Secondary | ICD-10-CM | POA: Diagnosis not present

## 2020-08-29 NOTE — Patient Instructions (Signed)
Steps to Quit Smoking Smoking tobacco is the leading cause of preventable death. It can affect almost every organ in the body. Smoking puts you and people around you at risk for many serious, long-lasting (chronic) diseases. Quitting smoking can be hard, but it is one of the best things that you can do for your health. It is never too late to quit. How do I get ready to quit? When you decide to quit smoking, make a plan to help you succeed. Before you quit:  Pick a date to quit. Set a date within the next 2 weeks to give you time to prepare.  Write down the reasons why you are quitting. Keep this list in places where you will see it often.  Tell your family, friends, and co-workers that you are quitting. Their support is important.  Talk with your doctor about the choices that may help you quit.  Find out if your health insurance will pay for these treatments.  Know the people, places, things, and activities that make you want to smoke (triggers). Avoid them. What first steps can I take to quit smoking?  Throw away all cigarettes at home, at work, and in your car.  Throw away the things that you use when you smoke, such as ashtrays and lighters.  Clean your car. Make sure to empty the ashtray.  Clean your home, including curtains and carpets. What can I do to help me quit smoking? Talk with your doctor about taking medicines and seeing a counselor at the same time. You are more likely to succeed when you do both.  If you are pregnant or breastfeeding, talk with your doctor about counseling or other ways to quit smoking. Do not take medicine to help you quit smoking unless your doctor tells you to do so. To quit smoking: Quit right away  Quit smoking totally, instead of slowly cutting back on how much you smoke over a period of time.  Go to counseling. You are more likely to quit if you go to counseling sessions regularly. Take medicine You may take medicines to help you quit. Some  medicines need a prescription, and some you can buy over-the-counter. Some medicines may contain a drug called nicotine to replace the nicotine in cigarettes. Medicines may:  Help you to stop having the desire to smoke (cravings).  Help to stop the problems that come when you stop smoking (withdrawal symptoms). Your doctor may ask you to use:  Nicotine patches, gum, or lozenges.  Nicotine inhalers or sprays.  Non-nicotine medicine that is taken by mouth. Find resources Find resources and other ways to help you quit smoking and remain smoke-free after you quit. These resources are most helpful when you use them often. They include:  Online chats with a counselor.  Phone quitlines.  Printed self-help materials.  Support groups or group counseling.  Text messaging programs.  Mobile phone apps. Use apps on your mobile phone or tablet that can help you stick to your quit plan. There are many free apps for mobile phones and tablets as well as websites. Examples include Quit Guide from the CDC and smokefree.gov   What things can I do to make it easier to quit?  Talk to your family and friends. Ask them to support and encourage you.  Call a phone quitline (1-800-QUIT-NOW), reach out to support groups, or work with a counselor.  Ask people who smoke to not smoke around you.  Avoid places that make you want to smoke,   such as: ? Bars. ? Parties. ? Smoke-break areas at work.  Spend time with people who do not smoke.  Lower the stress in your life. Stress can make you want to smoke. Try these things to help your stress: ? Getting regular exercise. ? Doing deep-breathing exercises. ? Doing yoga. ? Meditating. ? Doing a body scan. To do this, close your eyes, focus on one area of your body at a time from head to toe. Notice which parts of your body are tense. Try to relax the muscles in those areas.   How will I feel when I quit smoking? Day 1 to 3 weeks Within the first 24 hours,  you may start to have some problems that come from quitting tobacco. These problems are very bad 2-3 days after you quit, but they do not often last for more than 2-3 weeks. You may get these symptoms:  Mood swings.  Feeling restless, nervous, angry, or annoyed.  Trouble concentrating.  Dizziness.  Strong desire for high-sugar foods and nicotine.  Weight gain.  Trouble pooping (constipation).  Feeling like you may vomit (nausea).  Coughing or a sore throat.  Changes in how the medicines that you take for other issues work in your body.  Depression.  Trouble sleeping (insomnia). Week 3 and afterward After the first 2-3 weeks of quitting, you may start to notice more positive results, such as:  Better sense of smell and taste.  Less coughing and sore throat.  Slower heart rate.  Lower blood pressure.  Clearer skin.  Better breathing.  Fewer sick days. Quitting smoking can be hard. Do not give up if you fail the first time. Some people need to try a few times before they succeed. Do your best to stick to your quit plan, and talk with your doctor if you have any questions or concerns. Summary  Smoking tobacco is the leading cause of preventable death. Quitting smoking can be hard, but it is one of the best things that you can do for your health.  When you decide to quit smoking, make a plan to help you succeed.  Quit smoking right away, not slowly over a period of time.  When you start quitting, seek help from your doctor, family, or friends. This information is not intended to replace advice given to you by your health care provider. Make sure you discuss any questions you have with your health care provider. Document Revised: 12/09/2018 Document Reviewed: 06/04/2018 Elsevier Patient Education  2021 [employer]  

## 2020-08-29 NOTE — Progress Notes (Signed)
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  PRIOR THERAPY: Status post right lower lobectomy with lymph node dissection on April 04, 2018 under the care of Dr. Roxan Hockey.  CURRENT THERAPY: Observation.  INTERVAL HISTORY: Maria Arroyo 70 y.o. female returns to the clinic today for follow-up visit.  The patient is feeling fine today with no concerning complaints except for the chronic back pain and she is followed by Dr. Sherral Hammers and currently on oxycodone.  She denied having any current chest pain, shortness of breath, cough or hemoptysis.  She denied having any fever or chills.  She has no nausea, vomiting, diarrhea or constipation.  She has no headache or visual changes.  The patient has no weight loss or night sweats.  She had repeat CT scan of the chest performed recently and she is here for evaluation and discussion of her risk her results.  MEDICAL HISTORY: Past Medical History:  Diagnosis Date  . Cancer (Maria Arroyo)    lung  . Cervical disc disease   . 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 COPD (chronic obstructive pulmonary disease) ([place])   . Coronary artery disease   . Depression   . Gout    R foot and knee  . Hyperlipidemia   . Hypertension   . Left foot drop   . Neuropathy   . OA (osteoarthritis)   . Pica   . Tobacco abuse     ALLERGIES:  is allergic to ace inhibitors, codeine, and codeine-guaifenesin [guaifenesin-codeine].  MEDICATIONS:  Current Outpatient Medications  Medication Sig Dispense Refill  . acetaminophen (TYLENOL) 500 MG tablet Take 2 tablets (1,000 mg total) by mouth every 6 (six) hours. 30 tablet 0  . allopurinol (ZYLOPRIM) 100 MG tablet Take 100 mg by mouth daily.      [name] amiodarone (PACERONE) 200 MG tablet Take 1 tablet (200 mg total) by mouth daily. 90 tablet 3  . apixaban (ELIQUIS) 5 MG TABS tablet Take 1 tablet (5 mg total) by mouth 2 (two) times daily. 60 tablet 1  . Ascorbic Acid (VITAMIN C PO) Take 1,000 mg by mouth daily.    . bisoprolol (ZEBETA) 5 MG tablet TAKE 1/2 TABLET(2.5 MG) BY MOUTH DAILY 30 tablet 0  . busPIRone (BUSPAR) 15 MG tablet Take 15 mg by mouth 2 (two) times daily.    . Calcium Carbonate-Vitamin D (CALCIUM 600 + D PO) Take 1 tablet by mouth daily.    . carisoprodol (SOMA) 350 MG tablet Take 1 tablet by mouth 4 (four) times daily as needed for muscle spasms.    . Cholecalciferol (VITAMIN D) 2000 UNITS tablet Take 4,000 Units by mouth daily.    [name] gabapentin (NEURONTIN) 600 MG tablet TAKE 1 TABLET(600 MG) BY MOUTH FOUR TIMES DAILY AT 8 AM AND AT 1 PM AND AT 6 PM AND AT 10 PM 120 tablet 0  . indomethacin (INDOCIN SR) 75 MG CR capsule     . Multiple Vitamin (MULTIVITAMIN WITH MINERALS) TABS tablet Take 1 tablet by mouth daily. 30 tablet 0  . nicotine (NICODERM CQ - DOSED IN MG/24 HOURS) 21 mg/24hr patch Place 1 patch (21 mg total) onto the skin daily. 28 patch 0  . nitroGLYCERIN (NITROSTAT) 0.4 MG SL tablet  Place 1 tablet (0.4 mg total) under the tongue every 5 (five) minutes as needed. For chest pain (Patient taking differently: Place 0.4 mg under the tongue every 5 (five) minutes as needed for chest pain.) 100 tablet 0  . oxyCODONE-acetaminophen (PERCOCET) 10-325 MG tablet Take 1 tablet by mouth 4 (four) times daily as needed for pain.    . rosuvastatin (CRESTOR) 40 MG tablet TAKE 1 TABLET(40 MG) BY MOUTH DAILY 90 tablet 3  . torsemide (DEMADEX) 20 MG tablet TAKE 1 TABLET BY MOUTH DAILY    . traZODone (DESYREL) 50 MG tablet     . valACYclovir (VALTREX) 1000 MG tablet     . venlafaxine XR (EFFEXOR-XR) 75 MG 24 hr capsule Take 75 mg by mouth 2 (two) times daily.   0   No current facility-administered medications for this visit.     SURGICAL HISTORY:  Past Surgical History:  Procedure Laterality Date  . ABDOMINAL HYSTERECTOMY    . ANTERIOR CERVICAL DECOMP/DISCECTOMY FUSION  02/06/2011   Procedure: ANTERIOR CERVICAL DECOMPRESSION/DISCECTOMY FUSION 2 LEVELS;  Surgeon: Winfield Cunas;  Location: [place];  Service: Neurosurgery;  Laterality: N/A;  Anterior Cervical Four-Five/Five-Six Decompression and Fusion with Plating and Bonegraft  . BLADDER SURGERY    . CARDIAC CATHETERIZATION  04/09/2009   EF 60%  . CARDIAC CATHETERIZATION  03/24/2001   EF 55%  . CARDIAC CATHETERIZATION  01/06/2001   EF 65%  . CHOLECYSTECTOMY    . CORONARY STENT PLACEMENT  03/2009   STENTING OF THE PROXIMAL TO MID RIGHT CORONARY  . IR THORACENTESIS ASP PLEURAL SPACE W/IMG GUIDE  06/01/2018  . LEFT HEART CATHETERIZATION WITH CORONARY ANGIOGRAM N/A 12/21/2011   Procedure: LEFT HEART CATHETERIZATION WITH CORONARY ANGIOGRAM;  Surgeon: Peter M Martinique, MD;  Location: [facility] CATH LAB;  Service: Cardiovascular;  Laterality: N/A;  . LOBECTOMY    . NODE DISSECTION N/A 04/04/2018   Procedure: NODE DISSECTION;  Surgeon: Melrose Nakayama, MD;  Location: [place];  Service: Thoracic;  Laterality: N/A;  . OTHER SURGICAL HISTORY  12/2014   R foot cyst removal  . VIDEO ASSISTED THORACOSCOPY (VATS)/ LOBECTOMY Right 04/04/2018   Procedure: VIDEO ASSISTED THORACOSCOPY (VATS)/RIGHT LOWER LOBECTOMY;  Surgeon: Melrose Nakayama, MD;  Location: [place];  Service: Thoracic;  Laterality: Right;    REVIEW OF SYSTEMS:  A comprehensive review of systems was negative except for: Musculoskeletal: positive for back pain   PHYSICAL EXAMINATION: General appearance: alert, cooperative and no distress Head: Normocephalic, without obvious abnormality, atraumatic Neck: no adenopathy, no JVD, supple, symmetrical, trachea midline and thyroid not enlarged, symmetric, no tenderness/mass/nodules Lymph nodes: Cervical, supraclavicular, and axillary nodes normal. Resp: clear to  auscultation bilaterally Back: symmetric, no curvature. ROM normal. No CVA tenderness. Cardio: regular rate and rhythm, S1, S2 normal, no murmur, click, rub or gallop GI: soft, non-tender; bowel sounds normal; no masses,  no organomegaly Extremities: extremities normal, atraumatic, no cyanosis or edema  ECOG PERFORMANCE STATUS: 1 - Symptomatic but completely ambulatory  Blood pressure (!) 148/60, pulse (!) 59, temperature 97.8 F (36.6 C), temperature source Tympanic, resp. rate 20, height 5\' 1"  (1.549 m), weight 155 lb 3.2 oz (70.4 kg), SpO2 92 %.  LABORATORY DATA: Lab Results  Component Value Date   WBC 9.6 08/28/2020   HGB 10.3 (L) 08/28/2020   HCT 32.6 (L) 08/28/2020   MCV 97.9 08/28/2020   PLT 215 08/28/2020      Chemistry      Component Value Date/Time   NA 141  08/28/2020 0857   NA 143 05/03/2018 1530   K 4.2 08/28/2020 0857   CL 99 08/28/2020 0857   CO2 33 (H) 08/28/2020 0857   BUN 39 (H) 08/28/2020 0857   BUN 20 05/03/2018 1530   CREATININE 1.98 (H) 08/28/2020 0857   CREATININE 0.86 12/18/2011 1627      Component Value Date/Time   CALCIUM 9.3 08/28/2020 0857   ALKPHOS 95 08/28/2020 0857   AST 18 08/28/2020 0857   ALT 13 08/28/2020 0857   BILITOT <0.2 (L) 08/28/2020 0857       RADIOGRAPHIC STUDIES: CT Chest Wo Contrast  Result Date: 08/28/2020 CLINICAL DATA:  Primary Cancer Type: Non-small-cell lung cancer. Status post right lower lobectomy. Restaging. Imaging Indication: Routine surveillance Interval therapy since last imaging? 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 Primary Tumor location: Right lower lobe. Surgeries: Right lower lobectomy 04/04/2018.  Coronary stent. Chemotherapy: No Immunotherapy? No Radiation therapy? No EXAM: CT CHEST WITHOUT CONTRAST TECHNIQUE: Multidetector CT imaging of the chest was performed following the standard  protocol without IV contrast. COMPARISON:  04/29/2020 FINDINGS: Cardiovascular: The heart size is normal. No substantial pericardial effusion. Coronary artery calcification is evident. Atherosclerotic calcification is noted in the wall of the thoracic aorta. Enlargement of the pulmonary outflow tract and main pulmonary arteries suggests pulmonary arterial hypertension. Mediastinum/Nodes: No mediastinal lymphadenopathy. No evidence for gross hilar lymphadenopathy although assessment is limited by the lack of intravenous contrast on today's study. The esophagus has normal imaging features. There is no axillary lymphadenopathy. Lungs/Pleura: Centrilobular and paraseptal emphysema evident. Volume loss right hemithorax is compatible with the history of right lower lobectomy. No suspicious pulmonary nodule or mass. Several tiny peripheral right upper lobe nodules (images 24, 30, in 39) are stable in the interval. 3 mm posterior left upper lobe nodule on 54/7 is unchanged in the interval. The 3 mm peripheral left lower lobe pulmonary nodule described as new on the previous exam has resolved in the interval. Small loculated right pleural effusion is similar to prior. Upper Abdomen: Nonobstructing stones noted right kidney. Left kidney is atrophic with stable appearance of an upper interpolar exophytic cyst. Stable 17 mm right adrenal adenoma. Musculoskeletal: No worrisome lytic or sclerotic osseous abnormality. IMPRESSION: 1. Interval resolution of the 3 mm left lower lobe pulmonary nodule described as new on the prior study. No new or progressive findings on today's exam. 2. Status post right lower lobectomy. 3. Similar appearance small loculated right pleural effusion. 4. Stable right adrenal adenoma. 5. Aortic Atherosclerosis (ICD10-I70.0) and Emphysema (ICD10-J43.9). Electronically Signed   By: Misty Stanley M.D.   On: 08/28/2020 11:05    ASSESSMENT AND PLAN: This is a very pleasant 70 years old white female with a  stage Ib non-small cell lung cancer, poorly differentiated squamous cell carcinoma status post right lower lobectomy with lymph node dissection on April 04, 2018 under the care of Dr. Roxan Hockey. The patient is currently on observation and she has no concerning complaints today. She had repeat CT scan of the chest performed recently.  I personally and independently reviewed the scan and discussed the results with the patient today. Her scan showed no concerning findings for disease recurrence or metastasis. I recommended for her to continue on observation with repeat CT scan of the chest in 1 year. For the renal insufficiency she is followed by her primary care physician and nephrology. The patient was advised to call immediately if  she has any concerning symptoms in the interval. The patient voices understanding of current disease status and treatment options and is in agreement with the current care plan. All questions were answered. The patient knows to call the clinic with any problems, questions or concerns. We can certainly see the patient much sooner if necessary.   Disclaimer: This note was dictated with voice recognition software. Similar sounding words can inadvertently be transcribed and may not be corrected upon review.

## 2020-09-17 ENCOUNTER — Other Ambulatory Visit: Payer: Self-pay | Admitting: Cardiology

## 2020-12-14 ENCOUNTER — Other Ambulatory Visit: Payer: Self-pay | Admitting: Cardiology

## 2020-12-17 ENCOUNTER — Other Ambulatory Visit: Payer: Self-pay | Admitting: *Deleted

## 2020-12-17 MED ORDER — BISOPROLOL FUMARATE 5 MG PO TABS: 2.5000 mg | ORAL_TABLET | Freq: Every day | ORAL | 0 refills | Status: DC

## 2021-04-07 ENCOUNTER — Other Ambulatory Visit: Payer: Self-pay | Admitting: Cardiology

## 2021-04-07 DIAGNOSIS — I2510 Atherosclerotic heart disease of native coronary artery without angina pectoris: Secondary | ICD-10-CM

## 2021-04-28 ENCOUNTER — Other Ambulatory Visit: Payer: Self-pay

## 2021-04-28 MED ORDER — BISOPROLOL FUMARATE 5 MG PO TABS: 2.5000 mg | ORAL_TABLET | Freq: Every day | ORAL | 0 refills | Status: DC

## 2021-05-07 ENCOUNTER — Other Ambulatory Visit: Payer: Self-pay | Admitting: Cardiology

## 2021-05-07 DIAGNOSIS — I2510 Atherosclerotic heart disease of native coronary artery without angina pectoris: Secondary | ICD-10-CM

## 2021-05-08 ENCOUNTER — Telehealth: Payer: Self-pay

## 2021-05-08 MED ORDER — ROSUVASTATIN CALCIUM 40 MG PO TABS: ORAL_TABLET | ORAL | 0 refills | Status: DC

## 2021-05-08 NOTE — Telephone Encounter (Signed)
Called patient left message on personal voice mail we received a refill request for Crestor.Advised to call me back need to schedule follow up visit with Dr.Jordan.

## 2021-05-08 NOTE — Telephone Encounter (Signed)
Spoke to patient follow up appointment scheduled with Dr.Jordan 05/19/21 at 2:30 pm.Crestor refill sent to pharmacy.

## 2021-05-08 NOTE — Addendum Note (Signed)
Addended by: Kathyrn Lass on: 05/08/2021 03:56 PM   Modules accepted: Orders

## 2021-05-15 NOTE — Progress Notes (Signed)
Cardiology Office Note    Date:  05/19/2021   ID:  [name], [name] 31-Jul-1950, MRN [id number]  PCP:  [name], MD  Cardiologist:  Dr. Martinique  Chief Complaint  Patient presents with   Coronary Artery Disease   Atrial Fibrillation    History of Present Illness:  Maria Arroyo is a 71 y.o. female with PMH of HTN, HLD, COPD, tobacco abuse, lung nodule and CAD.  last seen in a televisit in March 2021. Patient underwent stenting of mid RCA in December 1999.  She returned in October 2002 and had stenting of proximal RCA.  In January 2011, she developed a high-grade stenosis in between the prior stents and that this was stented with additional drug-eluting stent.  She required repeat cardiac catheterization in September 2013, this revealed nonobstructive CAD.  Since then, she had been maintained on dual antiplatelet therapy.  Earlier this year due to significant unexplained weight loss, she underwent work-up for occult malignancy.  Chest x-ray showed a question of right lower lobe lung nodule.  PET scan showed hypermetabolic mass in RLL.  Patient was referred to Dr. Roxan Hockey for further evaluation.  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  Her Plavix was held prior to the procedure.  Postprocedure, her hospital course was complicated by occurrence of atrial fibrillation requiring IV amiodarone.    She was initially placed on amiodarone and converted to sinus rhythm.  However later had recurrence of atrial fibrillation during the same hospitalization.  She was started on Eliquis and discharged with downward titration of amiodarone.    She was seen by Almyra Deforest PA-C in January 2020. Was on amiodarone 200 mg daily. Bisoprolol was reduced due to bradycardia. She has been on Eliquis for anticoagulation.   She was admitted in late May 2020 with acute respiratory failure with hypoxemia.  Worsening right pleural effusion. Anemia with Hgb to 6.7. given iron and transfusion PRBC x 2. Diuresed with IV lasix and DC on lasix and HCTZ. On home oxygen. Echo done as noted below. BNP 1200.  On follow up today she reports her breathing is OK. She is on 3 liters [place] oxygen. She has gained about 30 lbs. Denies any swelling. No significant cough. No chest pain. Still smoking 1/2 pk/day.     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 11/07/2018   COPD (chronic obstructive pulmonary disease) ([facility])    Coronary artery disease    Depression    Gout    R foot and knee   Hyperlipidemia    Hypertension    Left foot drop    Neuropathy    OA (osteoarthritis)    Pica    Tobacco abuse     Past Surgical History:  Procedure Laterality Date   ABDOMINAL HYSTERECTOMY     ANTERIOR CERVICAL DECOMP/DISCECTOMY FUSION  02/06/2011   Procedure: ANTERIOR CERVICAL DECOMPRESSION/DISCECTOMY FUSION 2 LEVELS;  Surgeon: Winfield Cunas;  Location: [place] [facility];  Service: Neurosurgery;  Laterality: N/A;  Anterior Cervical Four-Five/Five-Six Decompression and Fusion with Plating and Bonegraft   BLADDER SURGERY     CARDIAC CATHETERIZATION  04/09/2009   EF 60%   CARDIAC CATHETERIZATION  03/24/2001   EF 55%   CARDIAC CATHETERIZATION  01/06/2001   EF 65%   CHOLECYSTECTOMY     CORONARY  STENT PLACEMENT  03/2009   STENTING OF THE PROXIMAL TO MID RIGHT CORONARY   IR THORACENTESIS ASP PLEURAL SPACE W/IMG GUIDE  06/01/2018   LEFT HEART CATHETERIZATION WITH CORONARY ANGIOGRAM N/A 12/21/2011   Procedure: LEFT HEART CATHETERIZATION WITH CORONARY ANGIOGRAM;  Surgeon: Cylus Douville M Martinique, MD;  Location: [facility] CATH LAB;  Service: Cardiovascular;  Laterality: N/A;   LOBECTOMY     NODE DISSECTION N/A 04/04/2018   Procedure: NODE DISSECTION;  Surgeon: Melrose Nakayama, MD;  Location: [place];  Service: Thoracic;  Laterality: N/A;   OTHER  SURGICAL HISTORY  12/2014   R foot cyst removal   VIDEO ASSISTED THORACOSCOPY (VATS)/ LOBECTOMY Right 04/04/2018   Procedure: VIDEO ASSISTED THORACOSCOPY (VATS)/RIGHT LOWER LOBECTOMY;  Surgeon: Melrose Nakayama, MD;  Location: [place];  Service: Thoracic;  Laterality: Right;    Current Medications: Outpatient Medications Prior to Visit  Medication Sig Dispense Refill   acetaminophen (TYLENOL) 500 MG tablet Take 2 tablets (1,000 mg total) by mouth every 6 (six) hours. 30 tablet 0   allopurinol (ZYLOPRIM) 100 MG tablet Take 100 mg by mouth daily.      Ascorbic Acid (VITAMIN C PO) Take 1,000 mg by mouth daily.     busPIRone (BUSPAR) 15 MG tablet Take 15 mg by mouth 2 (two) times daily.     Calcium Carbonate-Vitamin D (CALCIUM 600 + D PO) Take 1 tablet by mouth daily.     carisoprodol (SOMA) 350 MG tablet Take 1 tablet by mouth 4 (four) times daily as needed for muscle spasms.     Cholecalciferol (VITAMIN D) 2000 UNITS tablet Take 4,000 Units by mouth daily.     gabapentin (NEURONTIN) 600 MG tablet TAKE 1 TABLET(600 MG) BY MOUTH FOUR TIMES DAILY AT 8 AM AND AT 1 PM AND AT 6 PM AND AT 10 PM 120 tablet 0   indomethacin (INDOCIN SR) 75 MG CR capsule      Multiple Vitamin (MULTIVITAMIN WITH MINERALS) TABS tablet Take 1 tablet by mouth daily. 30 tablet 0   nicotine (NICODERM CQ - DOSED IN MG/24 HOURS) 21 mg/24hr patch Place 1 patch (21 mg total) onto the skin daily. 28 patch 0   oxyCODONE-acetaminophen (PERCOCET) 10-325 MG tablet Take 1 tablet by mouth 4 (four) times daily as needed for pain.     torsemide (DEMADEX) 20 MG tablet TAKE 1 TABLET BY MOUTH DAILY     traZODone (DESYREL) 50 MG tablet      valACYclovir (VALTREX) 1000 MG tablet      venlafaxine XR (EFFEXOR-XR) 75 MG 24 hr capsule Take 75 mg by mouth 2 (two) times daily.   0   amiodarone (PACERONE) 200 MG tablet Take 1 tablet (200 mg total) by mouth daily. 90 tablet 3   apixaban (ELIQUIS) 5 MG TABS tablet Take 1 tablet (5 mg total) by mouth  2 (two) times daily. 60 tablet 1   bisoprolol (ZEBETA) 5 MG tablet Take 0.5 tablets (2.5 mg total) by mouth daily. SCHEDULE OFFICE VISIT FOR FUTURE REFILLS 15 tablet 0   nitroGLYCERIN (NITROSTAT) 0.4 MG SL tablet Place 1 tablet (0.4 mg total) under the tongue every 5 (five) minutes as needed. For chest pain (Patient taking differently: Place 0.4 mg under the tongue every 5 (five) minutes as needed for chest pain.) 100 tablet 0   rosuvastatin (CRESTOR) 40 MG tablet TAKE 1 TABLET(40 MG) BY MOUTH DAILY 90 tablet 0   No facility-administered medications prior to visit.     Allergies:  Ace inhibitors, Codeine, and Codeine-guaifenesin [guaifenesin-codeine]   Social History   Socioeconomic History   Marital status: Married    Spouse name: Not on file   Number of children: 2   Years of education: 10   Highest education level: Not on file  Occupational History   Occupation: housewife    Employer: UNEMPLOYED  Tobacco Use   Smoking status: Every Day    Packs/day: 2.00    Years: 35.00    Pack years: 70.00    Types: Cigarettes   Smokeless tobacco: Never   Tobacco comments:    1-1.5 ppd (10/02/13), 03/17/18 2 PPD  Vaping Use   Vaping Use: Never used  Substance and Sexual Activity   Alcohol use: No    Alcohol/week: 0.0 standard drinks   Drug use: No   Sexual activity: Not on file  Other Topics Concern   Not on file  Social History Narrative   The patient is married Production designer, 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   The patient has a 10th grade education.   The patient is left-handed.   The patient has two children.   Patient drinks three cups of soda daily.       Social Determinants of Health   Financial Resource Strain: Not on file  Food Insecurity: Not on file  Transportation Needs: Not on file  Physical Activity: Not on file  Stress: Not on file  Social Connections: Not on file     Family History:  The patient's family history includes Aneurysm in her mother; Heart attack in her  brother and father; Heart failure in her father; Stroke in her sister.   ROS:   Please see the history of present illness.    ROS All other systems reviewed and are negative.   PHYSICAL EXAM:   VS:  BP 130/70 (BP Location: Left Arm, Patient Position: Sitting)    Pulse (!) 49    Ht 5\' 1"  (1.549 m)    Wt 171 lb (77.6 kg)    SpO2 97%    BMI 32.31 kg/m    GEN: Well nourished, obese, in no acute distress. On oxygen HEENT: normal  Neck: no JVD, carotid bruits, or masses Cardiac: RRR; no murmurs, rubs, or gallops,no edema  Respiratory: Decreased breath sounds in right base GI: soft, nontender, nondistended, + BS MS: no deformity or atrophy  Skin: warm and dry, no rash Neuro:  Alert and Oriented x 3, Strength and sensation are intact Psych: euthymic mood, full affect  Wt Readings from Last 3 Encounters:  05/19/21 171 lb (77.6 kg)  08/29/20 155 lb 3.2 oz (70.4 kg)  05/01/20 143 lb 9.6 oz (65.1 kg)      Studies/Labs Reviewed:   EKG:  EKG is ordered today.  NSR rate 49. First degree AV block. Nonspecific TWA. I have personally reviewed and interpreted this study.   Recent Labs: 08/28/2020: ALT 13; BUN 39; Creatinine 1.98; Hemoglobin 10.3; Platelet Count 215; Potassium 4.2; Sodium 141   Dated 09/20/18: Hgb 9.8. creatinine 1.37.  Dated 03/12/20: cholesterol 143, triglycerides 163, HDL 41, LDL 74.  Dated 12/18/20: A1c 6%. BUN 33, creatinine 1.5. otherwise CBC and CMET normal. TSH normal.   Lipid Panel No results found for: CHOL, TRIG, HDL, CHOLHDL, VLDL, LDLCALC, LDLDIRECT  Additional studies/ records that were reviewed today include:   Echo 04/07/2018 LV EF: 50% -   55%   ------------------------------------------------------------------- Indications:      Atrial fibrillation - 427.31.   ------------------------------------------------------------------- Study Conclusions   -  Left ventricle: EF hard to judge due to rapid afib. The cavity   size was normal. Wall thickness was  normal. Systolic function was   normal. The estimated ejection fraction was in the range of 50%   to 55%. The study is not technically sufficient to allow   evaluation of LV diastolic function. - Aortic valve: Sclerosis without stenosis. Valve area (VTI): 2.07   cm^2. Valve area (Vmean): 2.26 cm^2. - Right ventricle: The cavity size was mildly dilated.   Echo 07/31/18: IMPRESSIONS      1. The left ventricle has normal systolic function, with an ejection fraction of 55-60%. The cavity size was mildly dilated. Left ventricular diastolic Doppler parameters are consistent with impaired relaxation.  2. The right ventricle has moderately reduced systolic function. The cavity was mildly enlarged. There is no increase in right ventricular wall thickness.  3. Poor acoustic windows limit study of RV.  4. Right atrial size was moderately dilated.  5. The mitral valve is abnormal. Mild thickening of the mitral valve leaflet. Mild calcification of the mitral valve leaflet. Mitral valve regurgitation is mild to moderate by color flow Doppler.  6. MR is eccentric, directed posterior into LA Mild to moderate in severity.  7. The aortic valve is abnormal. Mild thickening of the aortic valve. Moderate calcification of the aortic valve.  8. The interatrial septum was not assessed.   ASSESSMENT:    1. Essential hypertension   2. Coronary artery disease involving native coronary artery of native heart without angina pectoris   3. PAF (paroxysmal atrial fibrillation) ([place])   4. Hyperlipidemia, unspecified hyperlipidemia type   5. Chronic obstructive pulmonary disease, unspecified COPD type ([place])      PLAN:  In order of problems listed above:  1. CAD s/p multiple prior stents of RCA. She is asymptomatic. Since she is on Eliquis we do not recommend ASA. Continue low dose beta blocker.   2. PAF: On Eliquis.  Maintaining NSR on amiodarone. Now on 200 mg daily. Labs followed by PCP. OK in September.   3.  HTN. Controlled.  4. HLD. On crestor. Needs lipid panel done with next lab draw.  5. Lung CAD s/p VATS with persistent right pleural effusion  6. COPD. Strongly advised smoking cessation.  7. Chronic diastolic CHF. Does not appear volume overloaded today.  8. Tobacco abuse.   9. Obesity with significant weight gain. Encourage weight loss   Follow up in one year.  Medication Adjustments/Labs and Tests Ordered: Current medicines are reviewed at length with the patient today.  Concerns regarding medicines are outlined above.  Medication changes, Labs and Tests ordered today are listed in the Patient Instructions below. There are no Patient Instructions on file for this visit.    Signed, Jakiera Ehler Martinique, MD  05/19/2021 3:34 PM    [facility] [address] [place], [place]  [address] Phone: [phone]; Fax: [phone]

## 2021-05-19 ENCOUNTER — Ambulatory Visit (INDEPENDENT_AMBULATORY_CARE_PROVIDER_SITE_OTHER): Payer: Medicare Other | Admitting: Cardiology

## 2021-05-19 ENCOUNTER — Encounter: Payer: Self-pay | Admitting: Cardiology

## 2021-05-19 ENCOUNTER — Other Ambulatory Visit: Payer: Self-pay

## 2021-05-19 VITALS — BP 130/70 | HR 49 | Ht 61.0 in | Wt 171.0 lb

## 2021-05-19 DIAGNOSIS — I10 Essential (primary) hypertension: Secondary | ICD-10-CM | POA: Diagnosis not present

## 2021-05-19 DIAGNOSIS — I2510 Atherosclerotic heart disease of native coronary artery without angina pectoris: Secondary | ICD-10-CM

## 2021-05-19 DIAGNOSIS — E785 Hyperlipidemia, unspecified: Secondary | ICD-10-CM | POA: Diagnosis not present

## 2021-05-19 DIAGNOSIS — I480 Paroxysmal atrial fibrillation: Secondary | ICD-10-CM

## 2021-05-19 DIAGNOSIS — J449 Chronic obstructive pulmonary disease, unspecified: Secondary | ICD-10-CM

## 2021-05-19 MED ORDER — NITROGLYCERIN 0.4 MG SL SUBL: 0.4000 mg | SUBLINGUAL_TABLET | SUBLINGUAL | 3 refills | Status: DC | PRN

## 2021-05-19 MED ORDER — APIXABAN 5 MG PO TABS: 5.0000 mg | ORAL_TABLET | Freq: Two times a day (BID) | ORAL | 3 refills | Status: DC

## 2021-05-19 MED ORDER — AMIODARONE HCL 200 MG PO TABS: 200.0000 mg | ORAL_TABLET | Freq: Every day | ORAL | 3 refills | Status: DC

## 2021-05-19 MED ORDER — BISOPROLOL FUMARATE 5 MG PO TABS: 2.5000 mg | ORAL_TABLET | Freq: Every day | ORAL | 3 refills | Status: DC

## 2021-05-19 MED ORDER — ROSUVASTATIN CALCIUM 40 MG PO TABS: ORAL_TABLET | ORAL | 3 refills | Status: DC

## 2021-06-02 ENCOUNTER — Telehealth: Payer: Self-pay | Admitting: Cardiology

## 2021-06-02 ENCOUNTER — Other Ambulatory Visit: Payer: Self-pay | Admitting: Cardiology

## 2021-06-02 MED ORDER — BISOPROLOL FUMARATE 5 MG PO TABS: 2.5000 mg | ORAL_TABLET | Freq: Every day | ORAL | 3 refills | Status: DC

## 2021-06-02 NOTE — Telephone Encounter (Signed)
?*  STAT* If patient is at the pharmacy, call can be transferred to refill team. ? ? ?1. Which medications need to be refilled? (please list name of each medication and dose if known) bisoprolol (ZEBETA) 5 MG tablet ? ?2. Which pharmacy/location (including street and city if local pharmacy) is medication to be sent to?[facility] [address] ? ?3. Do they need a 30 day or 90 day supply? 90 ? ?Patient cannot get medication - notes state that she needs an appt but saw Dr. Martinique on 05/19/21. Please notify patient when she can go pick up.   ?

## 2021-06-02 NOTE — Telephone Encounter (Signed)
Refills has been sent to the pharmacy. 

## 2021-07-22 ENCOUNTER — Telehealth: Payer: Self-pay | Admitting: Internal Medicine

## 2021-07-22 NOTE — Telephone Encounter (Signed)
Called patient regarding upcoming appointments, left a voicemail. 

## 2021-08-26 ENCOUNTER — Other Ambulatory Visit: Payer: Self-pay

## 2021-08-26 ENCOUNTER — Inpatient Hospital Stay: Payer: Medicare Other | Attending: Internal Medicine

## 2021-08-26 ENCOUNTER — Ambulatory Visit (HOSPITAL_COMMUNITY)
Admission: RE | Admit: 2021-08-26 | Discharge: 2021-08-26 | Disposition: A | Discharge status: Home / Self Care | Payer: Medicare Other | Source: Ambulatory Visit | Attending: Internal Medicine | Admitting: Internal Medicine

## 2021-08-26 DIAGNOSIS — C3490 Malignant neoplasm of unspecified part of unspecified bronchus or lung: Secondary | ICD-10-CM | POA: Insufficient documentation

## 2021-08-26 DIAGNOSIS — Z85118 Personal history of other malignant neoplasm of bronchus and lung: Secondary | ICD-10-CM | POA: Diagnosis not present

## 2021-08-26 DIAGNOSIS — R918 Other nonspecific abnormal finding of lung field: Secondary | ICD-10-CM | POA: Insufficient documentation

## 2021-08-26 LAB — CBC WITH DIFFERENTIAL (CANCER CENTER ONLY)
Abs Immature Granulocytes: 0.03 10*3/uL (ref 0.00–0.07)
Basophils Absolute: 0 10*3/uL (ref 0.0–0.1)
Basophils Relative: 0 %
Eosinophils Absolute: 0.2 10*3/uL (ref 0.0–0.5)
Eosinophils Relative: 2 %
HCT: 35.3 % — ABNORMAL LOW (ref 36.0–46.0)
Hemoglobin: 11.4 g/dL — ABNORMAL LOW (ref 12.0–15.0)
Immature Granulocytes: 0 %
Lymphocytes Relative: 18 %
Lymphs Abs: 1.8 10*3/uL (ref 0.7–4.0)
MCH: 30.7 pg (ref 26.0–34.0)
MCHC: 32.3 g/dL (ref 30.0–36.0)
MCV: 95.1 fL (ref 80.0–100.0)
Monocytes Absolute: 0.7 10*3/uL (ref 0.1–1.0)
Monocytes Relative: 7 %
Neutro Abs: 7.3 10*3/uL (ref 1.7–7.7)
Neutrophils Relative %: 73 %
Platelet Count: 238 10*3/uL (ref 150–400)
RBC: 3.71 MIL/uL — ABNORMAL LOW (ref 3.87–5.11)
RDW: 13.9 % (ref 11.5–15.5)
WBC Count: 10 10*3/uL (ref 4.0–10.5)
nRBC: 0 % (ref 0.0–0.2)

## 2021-08-26 LAB — CMP (CANCER CENTER ONLY)
ALT: 17 U/L (ref 0–44)
AST: 19 U/L (ref 15–41)
Albumin: 3.7 g/dL (ref 3.5–5.0)
Alkaline Phosphatase: 96 U/L (ref 38–126)
Anion gap: 3 — ABNORMAL LOW (ref 5–15)
BUN: 26 mg/dL — ABNORMAL HIGH (ref 8–23)
CO2: 37 mmol/L — ABNORMAL HIGH (ref 22–32)
Calcium: 9.4 mg/dL (ref 8.9–10.3)
Chloride: 98 mmol/L (ref 98–111)
Creatinine: 1.43 mg/dL — ABNORMAL HIGH (ref 0.44–1.00)
GFR, Estimated: 39 mL/min — ABNORMAL LOW (ref >60)
Glucose, Bld: 123 mg/dL — ABNORMAL HIGH (ref 70–99)
Potassium: 4.5 mmol/L (ref 3.5–5.1)
Sodium: 137 mmol/L (ref 135–145)
Total Bilirubin: 0.2 mg/dL — ABNORMAL LOW (ref 0.3–1.2)
Total Protein: 7.2 g/dL (ref 6.5–8.1)

## 2021-08-28 ENCOUNTER — Inpatient Hospital Stay: Payer: Medicare Other

## 2021-08-28 ENCOUNTER — Other Ambulatory Visit: Payer: Self-pay

## 2021-08-28 ENCOUNTER — Telehealth: Payer: Self-pay | Admitting: Cardiology

## 2021-08-28 ENCOUNTER — Inpatient Hospital Stay: Payer: Medicare Other | Attending: Internal Medicine | Admitting: Internal Medicine

## 2021-08-28 ENCOUNTER — Telehealth: Payer: Self-pay | Admitting: Medical Oncology

## 2021-08-28 VITALS — BP 170/90 | HR 43 | Temp 97.6°F | Resp 17 | Wt 164.4 lb

## 2021-08-28 DIAGNOSIS — I10 Essential (primary) hypertension: Secondary | ICD-10-CM

## 2021-08-28 DIAGNOSIS — Z79899 Other long term (current) drug therapy: Secondary | ICD-10-CM | POA: Diagnosis not present

## 2021-08-28 DIAGNOSIS — C3490 Malignant neoplasm of unspecified part of unspecified bronchus or lung: Secondary | ICD-10-CM

## 2021-08-28 DIAGNOSIS — I119 Hypertensive heart disease without heart failure: Secondary | ICD-10-CM | POA: Diagnosis not present

## 2021-08-28 DIAGNOSIS — C3431 Malignant neoplasm of lower lobe, right bronchus or lung: Secondary | ICD-10-CM | POA: Insufficient documentation

## 2021-08-28 MED ORDER — CLONIDINE HCL 0.1 MG PO TABS
0.2000 mg | ORAL_TABLET | Freq: Once | ORAL | Status: AC
  Administered 2021-08-28: 0.2 mg via ORAL
  Filled 2021-08-28: qty 2

## 2021-08-28 NOTE — Progress Notes (Signed)
Dr Morrison Old assistant notified of pt BP reading and that Clonidine 0.2 mg was given.

## 2021-08-28 NOTE — Progress Notes (Signed)
High BP- BP now 170/95. Per Maria Arroyo pt was discharged.   Of note pt did not take any of her hypertensive drugs today.  Pt stated she will take her meds when she gets home.  Cataract surgery scheduled for 1230 today . Update given to Dr Doug Sou staff.

## 2021-08-28 NOTE — Telephone Encounter (Signed)
Diane from Dr Worthy Flank office called back and stated that they sent patient home so she could take her other BP med, but stated that the BP dropped down slightly while there and is still requesting you reach out to patient to f/u.

## 2021-08-28 NOTE — Progress Notes (Signed)
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  PRIOR THERAPY: Status post right lower lobectomy with lymph node dissection on April 04, 2018 under the care of Dr. Roxan Hockey.  CURRENT THERAPY: Observation.  INTERVAL HISTORY: Maria Arroyo 71 y.o. female returns to the clinic today for annual follow-up visit.  The patient is feeling fine today with no concerning complaints except for the baseline shortness of breath and she is currently on home oxygen.  She is very anxious about a cataract surgery that is scheduled later today.  Her blood pressure is very high and she did not take her blood pressure medication earlier today.  She denied having any current chest pain, cough or hemoptysis.  She has no nausea, vomiting, diarrhea or constipation.  She has no headache or visual changes.  She is here today for evaluation with repeat CT scan of the chest for restaging of her disease.  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  MEDICATIONS:  Current Outpatient Medications  Medication Sig Dispense Refill   acetaminophen (TYLENOL) 500 MG tablet Take 2 tablets (1,000 mg total) by mouth every 6 (six) hours. 30 tablet 0   allopurinol (ZYLOPRIM) 100 MG tablet Take 100 mg by  mouth daily.      amiodarone (PACERONE) 200 MG tablet Take 1 tablet (200 mg total) by mouth daily. 90 tablet 3   apixaban (ELIQUIS) 5 MG TABS tablet Take 1 tablet (5 mg total) by mouth 2 (two) times daily. 180 tablet 3   Ascorbic Acid (VITAMIN C PO) Take 1,000 mg by mouth daily.     bisoprolol (ZEBETA) 5 MG tablet Take 0.5 tablets (2.5 mg total) by mouth daily. 45 tablet 3   busPIRone (BUSPAR) 15 MG tablet Take 15 mg by mouth 2 (two) times daily.     Calcium Carbonate-Vitamin D (CALCIUM 600 + D PO) Take 1 tablet by mouth daily.     carisoprodol (SOMA) 350 MG tablet Take 1 tablet by mouth 4 (four) times daily as needed for muscle spasms.     Cholecalciferol (VITAMIN D) 2000 UNITS tablet Take 4,000 Units by mouth daily.     gabapentin (NEURONTIN) 600 MG tablet TAKE 1 TABLET(600 MG) BY MOUTH FOUR TIMES DAILY AT 8 AM AND AT 1 PM AND AT 6 PM AND AT 10 PM 120 tablet 0   indomethacin (INDOCIN SR) 75 MG CR capsule      Multiple Vitamin (MULTIVITAMIN WITH MINERALS) TABS tablet Take 1 tablet by mouth daily. 30 tablet 0   nicotine (NICODERM CQ - DOSED IN MG/24 HOURS) 21 mg/24hr patch Place 1 patch (21 mg total) onto the skin daily. 28 patch 0  nitroGLYCERIN (NITROSTAT) 0.4 MG SL tablet Place 1 tablet (0.4 mg total) under the tongue every 5 (five) minutes as needed. For chest pain 100 tablet 3   oxyCODONE-acetaminophen (PERCOCET) 10-325 MG tablet Take 1 tablet by mouth 4 (four) times daily as needed for pain.     rosuvastatin (CRESTOR) 40 MG tablet TAKE 1 TABLET(40 MG) BY MOUTH DAILY 90 tablet 3   torsemide (DEMADEX) 20 MG tablet TAKE 1 TABLET BY MOUTH DAILY     traZODone (DESYREL) 50 MG tablet      valACYclovir (VALTREX) 1000 MG tablet      venlafaxine XR (EFFEXOR-XR) 75 MG 24 hr capsule Take 75 mg by mouth 2 (two) times daily.   0   No current facility-administered medications for this visit.    SURGICAL HISTORY:  Past Surgical History:  Procedure Laterality Date   ABDOMINAL HYSTERECTOMY      ANTERIOR CERVICAL DECOMP/DISCECTOMY FUSION  02/06/2011   Procedure: ANTERIOR CERVICAL DECOMPRESSION/DISCECTOMY FUSION 2 LEVELS;  Surgeon: Winfield Cunas;  Location: [facility];  Service: Neurosurgery;  Laterality: N/A;  Anterior Cervical Four-Five/Five-Six Decompression and Fusion with Plating and Bonegraft   BLADDER SURGERY     CARDIAC CATHETERIZATION  04/09/2009   EF 60%   CARDIAC CATHETERIZATION  03/24/2001   EF 55%   CARDIAC CATHETERIZATION  01/06/2001   EF 65%   CHOLECYSTECTOMY     CORONARY STENT PLACEMENT  03/2009   STENTING OF THE PROXIMAL TO MID RIGHT CORONARY   IR THORACENTESIS ASP PLEURAL SPACE W/IMG GUIDE  06/01/2018   LEFT HEART CATHETERIZATION WITH CORONARY ANGIOGRAM N/A 12/21/2011   Procedure: LEFT HEART CATHETERIZATION WITH CORONARY ANGIOGRAM;  Surgeon: Peter M Martinique, MD;  Location: [facility];  Service: Cardiovascular;  Laterality: N/A;   LOBECTOMY     NODE DISSECTION N/A 04/04/2018   Procedure: NODE DISSECTION;  Surgeon: Melrose Nakayama, MD;  Location: [facility];  Service: Thoracic;  Laterality: N/A;   OTHER SURGICAL HISTORY  12/2014   R foot cyst removal   VIDEO ASSISTED THORACOSCOPY (VATS)/ LOBECTOMY Right 04/04/2018   Procedure: VIDEO ASSISTED THORACOSCOPY (VATS)/RIGHT LOWER LOBECTOMY;  Surgeon: Melrose Nakayama, MD;  Location: [facility];  Service: Thoracic;  Laterality: Right;    REVIEW OF SYSTEMS:  Constitutional: positive for fatigue Eyes: negative Ears, nose, mouth, throat, and face: negative Respiratory: positive for dyspnea on exertion Cardiovascular: negative Gastrointestinal: negative Genitourinary:negative Integument/breast: negative Hematologic/lymphatic: negative Musculoskeletal:negative Neurological: negative Behavioral/Psych: negative Endocrine: negative Allergic/Immunologic: negative   PHYSICAL EXAMINATION: General appearance: alert, cooperative, and no distress Head: Normocephalic, without obvious abnormality, atraumatic Neck: no adenopathy, no  JVD, supple, symmetrical, trachea midline, and thyroid not enlarged, symmetric, no tenderness/mass/nodules Lymph nodes: Cervical, supraclavicular, and axillary nodes normal. Resp: clear to auscultation bilaterally Back: symmetric, no curvature. ROM normal. No CVA tenderness. Cardio: regular rate and rhythm, S1, S2 normal, no murmur, click, rub or gallop GI: soft, non-tender; bowel sounds normal; no masses,  no organomegaly Extremities: extremities normal, atraumatic, no cyanosis or edema Neurologic: Alert and oriented X 3, normal strength and tone. Normal symmetric reflexes. Normal coordination and gait  ECOG PERFORMANCE STATUS: 1 - Symptomatic but completely ambulatory  Blood pressure (!) 213/118, pulse (!) 58, temperature 97.6 F (36.4 C), temperature source Tympanic, resp. rate 17, weight 164 lb 6 oz (74.6 kg), SpO2 97 %.  LABORATORY DATA: Lab Results  Component Value Date   WBC 10.0 08/26/2021   HGB 11.4 (L) 08/26/2021   HCT 35.3 (L) 08/26/2021   MCV 95.1 08/26/2021  PLT 238 08/26/2021      Chemistry      Component Value Date/Time   NA 137 08/26/2021 1048   NA 143 05/03/2018 1530   K 4.5 08/26/2021 1048   CL 98 08/26/2021 1048   CO2 37 (H) 08/26/2021 1048   BUN 26 (H) 08/26/2021 1048   BUN 20 05/03/2018 1530   CREATININE 1.43 (H) 08/26/2021 1048   CREATININE 0.86 12/18/2011 1627      Component Value Date/Time   CALCIUM 9.4 08/26/2021 1048   ALKPHOS 96 08/26/2021 1048   AST 19 08/26/2021 1048   ALT 17 08/26/2021 1048   BILITOT 0.2 (L) 08/26/2021 1048       RADIOGRAPHIC STUDIES: CT Chest Wo Contrast  Result Date: 08/26/2021 CLINICAL DATA:  Non-small cell lung cancer restaging, status post right lower lobectomy * Tracking Code: BO * EXAM: CT CHEST WITHOUT CONTRAST TECHNIQUE: Multidetector CT imaging of the chest was performed following the standard protocol without IV contrast. RADIATION DOSE REDUCTION: This exam was performed according to the departmental  dose-optimization program which includes automated exposure control, adjustment of the mA and/or kV according to patient size and/or use of iterative reconstruction technique. COMPARISON:  08/28/2020 FINDINGS: Cardiovascular: Aortic atherosclerosis. Mild cardiomegaly. Extensive 3 vessel coronary artery calcifications. Gross enlargement of the main pulmonary artery measuring up to 4.3 cm in caliber. No pericardial effusion. Mediastinum/Nodes: No enlarged mediastinal, hilar, or axillary lymph nodes. Thyroid gland, trachea, and esophagus demonstrate no significant findings. Lungs/Pleura: Moderate to severe centrilobular emphysema. Status post right lower lobectomy. Unchanged small to moderate, loculated right pleural effusion. New subpleural nodule of the anterior right upper lobe measuring 0.6 cm (series 5, image 55) as well as of the right apex measuring 0.7 cm (series 5, image 22). Additional small nodules of the peripheral right apex are unchanged measuring up to 0.4 cm (series 5, image 22). Unchanged elongated subpleural nodule or consolidation of the anterior right middle lobe (series 5, image 21). Upper Abdomen: No acute abnormality. Small nonobstructive calculi and or renal vascular calcifications of the partially imaged kidneys. Unchanged definitively benign macroscopic fat containing right adrenal adenoma. Musculoskeletal: No chest wall abnormality. No suspicious osseous lesions identified. IMPRESSION: 1. Status post right lower lobectomy. Unchanged small to moderate, loculated right pleural effusion. 2. New irregular subpleural nodules of the anterior right upper lobe measuring 0.6 cm as well as of the right apex measuring 0.7 cm. These are nonspecific and possibly infectious or inflammatory given fluctuant nodularity seen on prior examinations, however metastases are difficult to exclude. Continued attention on follow-up. Additional small nodules of the peripheral right apex are unchanged measuring up to  0.4 cm. 3. Emphysema. 4. Gross enlargement of the main pulmonary artery, as can be seen pulmonary hypertension. 5. Coronary artery disease. 6. Small nonobstructive calculi and or renal vascular calcifications of the partially imaged kidneys. Aortic Atherosclerosis (ICD10-I70.0) and Emphysema (ICD10-J43.9). Electronically Signed   By: Delanna Ahmadi M.D.   On: 08/26/2021 16:02     ASSESSMENT AND PLAN: This is a very pleasant 71 years old white female with a stage Ib non-small cell lung cancer, poorly differentiated squamous cell carcinoma status post right lower lobectomy with lymph node dissection on April 04, 2018 under the care of Dr. Roxan Hockey. The patient has been in observation for the last 3 years and she is feeling fine with no concerning complaints except for the baseline shortness of breath.  She is currently on home oxygen. She had repeat CT scan of the chest performed  recently.  I personally and independently reviewed the scan images and discussed the results with the patient today.  Her scan showed no concerning findings for disease progression except for new irregular subpleural nodules in the anterior right upper lobe that need close monitoring. I recommended for the patient to have repeat CT scan of the chest in around 6 months from now for evaluation of this nodule and to rule out any disease recurrence or progression. For the hypertension, we will give the patient 0.2 mg of clonidine and recheck her blood pressure.  If she continues to have significant elevation of her blood pressure, she will need to reach out to her primary care physician for further evaluation and adjustment of her medication. The patient was advised to call immediately if she has any concerning symptoms in the interval. The patient voices understanding of current disease status and treatment options and is in agreement with the current care plan. All questions were answered. The patient knows to call the clinic with  any problems, questions or concerns. We can certainly see the patient much sooner if necessary.   Disclaimer: This note was dictated with voice recognition software. Similar sounding words can inadvertently be transcribed and may not be corrected upon review.

## 2021-08-28 NOTE — Telephone Encounter (Signed)
Called pt regarding elevated blood pressure while at oncology office. Pt states that she just got home and has not had the opportunity to take medication or re-take her blood pressure. Pt states that she did not take her medication prior to her visit. Pt will take her medication now, but is having cataract surgery today at 12:30pm. Pt will call us back if she continues to have issues with her blood pressure. Pt verbalizes understanding.

## 2021-08-28 NOTE — Telephone Encounter (Addendum)
Dr. Doug Sou assistant notified of pts  high BP  Cclonidine 0.2 mg given -  BP - still high @ 30 mins after Clonidine -pt  asymptomatic   BP 170/90 @ one  hour after Clonidine.  Pt discharged to home.

## 2021-08-28 NOTE — Telephone Encounter (Signed)
Pt c/o BP issue: STAT if pt c/o blurred vision, one-sided weakness or slurred speech  1. What are your last 5 BP readings? 113/118 185/98 198/67  2. Are you having any other symptoms (ex. Dizziness, headache, blurred vision, passed out)? No  3. What is your BP issue?   Pt is currently at Dr Worthy Flank office at the [facility] and the BP readings are high. Pt is suppose to be getting cataract surgery this afternoon.  They gave her a bp pill and have her at office. States they will keep her there until you return their call with instructions. Dr Julien Nordmann states he does not want to send her to the emergency room

## 2021-08-28 NOTE — Addendum Note (Signed)
Addended by: Ardeen Garland on: 08/28/2021 10:56 AM   Modules accepted: Orders

## 2021-12-02 ENCOUNTER — Encounter (INDEPENDENT_AMBULATORY_CARE_PROVIDER_SITE_OTHER): Payer: Self-pay

## 2021-12-10 ENCOUNTER — Ambulatory Visit (INDEPENDENT_AMBULATORY_CARE_PROVIDER_SITE_OTHER): Payer: Medicare Other | Admitting: Ophthalmology

## 2021-12-10 ENCOUNTER — Encounter (INDEPENDENT_AMBULATORY_CARE_PROVIDER_SITE_OTHER): Payer: Self-pay | Admitting: Ophthalmology

## 2021-12-10 DIAGNOSIS — H35073 Retinal telangiectasis, bilateral: Secondary | ICD-10-CM

## 2021-12-10 DIAGNOSIS — H34831 Tributary (branch) retinal vein occlusion, right eye, with macular edema: Secondary | ICD-10-CM | POA: Diagnosis not present

## 2021-12-10 DIAGNOSIS — H43813 Vitreous degeneration, bilateral: Secondary | ICD-10-CM | POA: Diagnosis not present

## 2021-12-10 DIAGNOSIS — H35351 Cystoid macular degeneration, right eye: Secondary | ICD-10-CM | POA: Diagnosis not present

## 2021-12-10 DIAGNOSIS — I2510 Atherosclerotic heart disease of native coronary artery without angina pectoris: Secondary | ICD-10-CM | POA: Diagnosis not present

## 2021-12-10 DIAGNOSIS — H35352 Cystoid macular degeneration, left eye: Secondary | ICD-10-CM | POA: Insufficient documentation

## 2021-12-10 MED ORDER — TRIAMCINOLONE ACETONIDE 40 MG/ML IJ SUSP FOR KALEIDOSCOPE
40.0000 mg | INTRAMUSCULAR | Status: AC | PRN
  Administered 2021-12-10: 40 mg via INTRAVITREAL

## 2021-12-10 NOTE — Assessment & Plan Note (Signed)
We will attempt to treat with additional to the current ketorolac use, using periocular Kenalog left eye.

## 2021-12-10 NOTE — Assessment & Plan Note (Addendum)
Chronic lung disease patient on full-time oxygen concentrator day at night.  Thus this may have some suspected effect with chronic hypoxia upon the macular region triggering leakages foveal MAs  Splane the importance of discontinuing this diminishing smoking efforts so that daily use of carbon monoxide and help with smoking will not place in her brain or in this case the eyes which are piece of the brain.

## 2021-12-10 NOTE — Assessment & Plan Note (Signed)
Return visit within 1 to 2 weeks right eye attempted periocular Kenalog retroseptal.

## 2021-12-10 NOTE — Assessment & Plan Note (Signed)
No holes or tears

## 2021-12-10 NOTE — Progress Notes (Signed)
12/10/2021     CHIEF COMPLAINT Patient presents for  Chief Complaint  Patient presents with   Retina Evaluation   Cystoid Macular Edema      HISTORY OF PRESENT ILLNESS: Maria Arroyo is a 71 y.o. female who presents to the clinic today for:   HPI     Retina Evaluation           Laterality: both eyes   Associated Symptoms: Negative for Flashes, Floaters, Distortion, Blind Spot, Pain, Redness, Photophobia, Glare, Trauma, Scalp Tenderness, Jaw Claudication, Shoulder/Hip pain, Fever, Weight Loss and Fatigue         Comments   NP- CME OU REF BY CHRIS GROAT. Pt stated, "Dr. Katy Fitch told me theres some swelling int he back of both my eyes and informed me that I would see better if I get that checked out by Dr. Zadie Rhine. My vision has been blurry but not too bad. Only enough to notice a different. He also told me a lot of time after cataract sx, that happens." Pt is currently taking Ketorolac 4x daily in both eyes. Pt had cataract sx performed by Dr. Midge Aver In April and June. Vision improved since onset of therapy.       Last edited by Hurman Horn, MD on 12/10/2021  9:24 AM.      Referring physician: Warden Fillers, MD [address] [place],  [place] [address]  HISTORICAL INFORMATION:   Selected notes from the MEDICAL RECORD NUMBER       CURRENT MEDICATIONS: Current Outpatient Medications (Ophthalmic Drugs)  Medication Sig   ketorolac (ACULAR) 0.4 % SOLN Place 1 drop into the left eye 4 (four) times daily.   Prednisol Ace-Moxiflox-Bromfen 1-0.5-0.075 % SUSP Place 1 drop into the right eye 4 (four) times daily.   prednisoLONE acetate (PRED FORTE) 1 % ophthalmic suspension Place 1 drop into the left eye 4 (four) times daily.   No current facility-administered medications for this visit. (Ophthalmic Drugs)   Current Outpatient Medications (Other)  Medication Sig   acetaminophen (TYLENOL) 500 MG tablet Take 2 tablets (1,000 mg total) by  mouth every 6 (six) hours. (Patient not taking: Reported on 08/28/2021)   allopurinol (ZYLOPRIM) 100 MG tablet Take 100 mg by mouth daily.    amiodarone (PACERONE) 200 MG tablet Take 1 tablet (200 mg total) by mouth daily.   apixaban (ELIQUIS) 5 MG TABS tablet Take 1 tablet (5 mg total) by mouth 2 (two) times daily.   Ascorbic Acid (VITAMIN C PO) Take 1,000 mg by mouth daily.   bisoprolol (ZEBETA) 5 MG tablet Take 0.5 tablets (2.5 mg total) by mouth daily.   busPIRone (BUSPAR) 15 MG tablet Take 15 mg by mouth 2 (two) times daily.   Calcium Carbonate-Vitamin D (CALCIUM 600 + D PO) Take 1 tablet by mouth daily.   carisoprodol (SOMA) 350 MG tablet Take 1 tablet by mouth 4 (four) times daily as needed for muscle spasms. (Patient not taking: Reported on 08/28/2021)   Cholecalciferol (VITAMIN D) 2000 UNITS tablet Take 4,000 Units by mouth daily.   gabapentin (NEURONTIN) 600 MG tablet TAKE 1 TABLET(600 MG) BY MOUTH FOUR TIMES DAILY AT 8 AM AND AT 1 PM AND AT 6 PM AND AT 10 PM   hydrochlorothiazide (MICROZIDE) 12.5 MG capsule Take 12.5 mg by mouth daily.   ibuprofen (ADVIL) 200 MG tablet Take 200 mg by mouth every 6 (six) hours as needed for headache.   indomethacin (INDOCIN SR) 75  MG CR capsule  (Patient not taking: Reported on 08/28/2021)   Multiple Vitamin (MULTIVITAMIN WITH MINERALS) TABS tablet Take 1 tablet by mouth daily.   nicotine (NICODERM CQ - DOSED IN MG/24 HOURS) 21 mg/24hr patch Place 1 patch (21 mg total) onto the skin daily. (Patient not taking: Reported on 08/28/2021)   nitroGLYCERIN (NITROSTAT) 0.4 MG SL tablet Place 1 tablet (0.4 mg total) under the tongue every 5 (five) minutes as needed. For chest pain (Patient not taking: Reported on 08/28/2021)   oxyCODONE-acetaminophen (PERCOCET) 10-325 MG tablet Take 1 tablet by mouth 4 (four) times daily as needed for pain.   rosuvastatin (CRESTOR) 40 MG tablet TAKE 1 TABLET(40 MG) BY MOUTH DAILY   torsemide (DEMADEX) 20 MG tablet TAKE 1 TABLET BY MOUTH  DAILY   traZODone (DESYREL) 50 MG tablet    venlafaxine XR (EFFEXOR-XR) 75 MG 24 hr capsule Take 75 mg by mouth 2 (two) times daily.    No current facility-administered medications for this visit. (Other)      REVIEW OF SYSTEMS: ROS   Negative for: Constitutional, Gastrointestinal, Neurological, Skin, Genitourinary, Musculoskeletal, HENT, Endocrine, Cardiovascular, Eyes, Respiratory, Psychiatric, Allergic/Imm, Heme/Lymph Last edited by Silvestre Moment on 12/10/2021  8:46 AM.       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 11/07/2018   COPD (chronic obstructive pulmonary disease) ([place])    Coronary artery disease    Depression    Gout    R foot and knee   Hyperlipidemia    Hypertension    Left foot drop    Neuropathy    OA (osteoarthritis)    P[place]    Tobacco abuse    Past Surgical History:  Procedure Laterality Date   ABDOMINAL HYSTERECTOMY     ANTERIOR CERVICAL DECOMP/DISCECTOMY FUSION  02/06/2011   Procedure: ANTERIOR CERVICAL DECOMPRESSION/DISCECTOMY FUSION 2 LEVELS;  Surgeon: Winfield Cunas;  Location: [facility];  Service: Neurosurgery;  Laterality: N/A;  Anterior Cervical Four-Five/Five-Six Decompression and Fusion with Plating and Bonegraft   BLADDER SURGERY     CARDIAC CATHETERIZATION  04/09/2009   EF 60%   CARDIAC CATHETERIZATION  03/24/2001   EF 55%   CARDIAC CATHETERIZATION  01/06/2001   EF 65%   CHOLECYSTECTOMY     CORONARY STENT PLACEMENT  03/2009   STENTING OF THE PROXIMAL TO MID RIGHT CORONARY   IR THORACENTESIS ASP PLEURAL SPACE W/IMG GUIDE  06/01/2018   LEFT HEART CATHETERIZATION WITH  CORONARY ANGIOGRAM N/A 12/21/2011   Procedure: LEFT HEART CATHETERIZATION WITH CORONARY ANGIOGRAM;  Surgeon: Peter M Martinique, MD;  Location: [facility];  Service: Cardiovascular;  Laterality: N/A;   LOBECTOMY     NODE DISSECTION N/A 04/04/2018   Procedure: NODE DISSECTION;  Surgeon: Melrose Nakayama, MD;  Location: [facility];  Service: Thoracic;  Laterality: N/A;   OTHER SURGICAL HISTORY  12/2014   R foot cyst removal   VIDEO ASSISTED THORACOSCOPY (VATS)/ LOBECTOMY Right 04/04/2018   Procedure: VIDEO ASSISTED THORACOSCOPY (VATS)/RIGHT LOWER LOBECTOMY;  Surgeon: Melrose Nakayama, MD;  Location: [facility];  Service: Thoracic;  Laterality: Right;  FAMILY HISTORY Family History  Problem Relation Age of Onset   Aneurysm Mother    Heart attack Father    Heart failure Father    Stroke Sister    Heart attack Brother     SOCIAL HISTORY Social History   Tobacco Use   Smoking status: Every Day    Packs/day: 2.00    Years: 35.00    Total pack years: 70.00    Types: Cigarettes   Smokeless tobacco: Never   Tobacco comments:    1-1.5 ppd (10/02/13), 03/17/18 2 PPD  Vaping Use   Vaping Use: Never used  Substance Use Topics   Alcohol use: No    Alcohol/week: 0.0 standard drinks of alcohol   Drug use: No         OPHTHALMIC EXAM:  Base Eye Exam     Visual Acuity (ETDRS)       Right Left   Dist [place] 20/40 20/40   Dist ph [place] NI 20/30 -1 +2         Tonometry (Tonopen, 8:52 AM)       Right Left   Pressure 15 17         Pupils       Pupils Dark Light Shape React APD   Right PERRL 4 3 Round Brisk None   Left PERRL 4 3 Round Brisk None         Visual Fields       Left Right    Full Full         Extraocular Movement       Right Left    Full Full         Neuro/Psych     Oriented x3: Yes         Dilation     Both eyes: 1.0% Mydriacyl, 2.5% Phenylephrine @ 8:52 AM           Slit Lamp and Fundus Exam     External Exam       Right Left    External Normal Normal         Slit Lamp Exam       Right Left   Lids/Lashes Normal Normal   Conjunctiva/Sclera White and quiet White and quiet   Cornea Clear Clear   Anterior Chamber Deep and quiet Deep and quiet   Iris Round and reactive Round and reactive   Lens Centered posterior chamber intraocular lens Centered posterior chamber intraocular lens   Anterior Vitreous Normal Normal         Fundus Exam       Right Left   Posterior Vitreous Posterior vitreous detachment Posterior vitreous detachment   Disc Splinter hemorrhage and for temporal margin of nerve Normal   C/D Ratio 0.3 0.35   Macula Sector pattern of microaneurysms along the macula looks like small twig PVO Microaneurysms clustered temporal macula   Vessels Twig BRVO macula Normal   Periphery Normal Normal            IMAGING AND PROCEDURES  Imaging and Procedures for 12/10/21  OCT, Retina - OU - Both Eyes       Right Eye Quality was good. Scan locations included subfoveal. Central Foveal Thickness: 438. Findings include abnormal foveal contour, cystoid macular edema.   Left Eye Quality was good. Scan locations included subfoveal. Central Foveal Thickness: 360. Findings include abnormal foveal contour, cystoid macular edema.   Notes Incidental posterior vitreous detachment OU.  Small perifoveal operculum OD  Small  serous subfoveal detachment OU.  As well.  Patient is on full-time oxygen.     Color Fundus Photography Optos - OU - Both Eyes       Right Eye Progression has no prior data. Disc findings include hemorrhage. Macula : microaneurysms. Periphery : normal observations.   Left Eye Progression has no prior data. Disc findings include normal observations. Macula : microaneurysms. Periphery : normal observations.   Notes Twig BVO OD macula     Injection into Tenon's Capsule - OS - Left Eye       Time Out 12/10/2021. 9:36 AM. Confirmed correct patient, procedure, site, and patient  consented.   Anesthesia No anesthesia was used.   Procedure A 27 gauge needle was used.   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  Thus this may have some suspected effect with chronic hypoxia upon the macular region triggering leakages foveal MAs  Splane the importance of discontinuing this diminishing smoking efforts so that daily use of carbon monoxide and help with smoking will not place in her brain or in this case the eyes which are piece of the brain.  Posterior vitreous detachment of both eyes No holes or tears  Cystoid macular edema, left eye We will attempt to treat with additional to the current ketorolac use, using periocular Kenalog left eye.  Cystoid macular edema of right eye Return visit within 1 to 2 weeks right eye attempted periocular Kenalog retroseptal.     ICD-10-CM   1. Cystoid macular edema, left eye  H35.352 OCT, Retina - OU - Both Eyes    Color Fundus Photography Optos - OU - Both Eyes    Injection into Tenon's Capsule - OS - Left Eye    triamcinolone acetonide (KENALOG-40) injection 40 mg    CANCELED: Intravitreal Injection, Pharmacologic Agent - OS - Left Eye    2. Cystoid macular edema of right eye  H35.351 OCT, Retina - OU - Both Eyes    Color Fundus Photography Optos - OU - Both Eyes    3. Branch retinal vein occlusion of right eye with macular edema  H34.8310 OCT, Retina - OU - Both Eyes    Color Fundus Photography Optos - OU - Both Eyes    4. Retinal telangiectasia of both eyes  H35.073     5. Posterior vitreous detachment of both eyes  H43.813       1.  Patient to continue on topical ketorolac 1 drop right eye and left  eye each 4 times daily  2.  Retroseptal Kenalog added OS today follow-up next in 2 weeks to assess for efficacy.  3.  May require relatively Kenalog and/or injection of antivegF what appears to be more twig macular BVO OD  Ophthalmic Meds Ordered this visit:  Meds ordered this encounter  Medications   triamcinolone acetonide (KENALOG-40) injection 40 mg       Return in about 20 days (around 12/30/2021) for Retroseptal Kenalog, OD, DILATE OU, OCT.  There are no Patient Instructions on file for this visit.   Explained the diagnoses, plan, and follow up with the patient and they expressed understanding.  Patient expressed understanding of  the importance of proper follow up care.   Clent Demark Zyia Kaneko M.D. Diseases & Surgery of the Retina and Vitreous [facility]ina [facility] Diabetic [place] 12/10/21     Abbreviations: M myopia (nearsighted); A astigmatism; H hyperopia (farsighted); P presbyopia; Mrx spectacle prescription;  CTL contact lenses; OD right eye; OS left eye; OU both eyes  XT exotropia; ET esotropia; PEK punctate epithelial keratitis; PEE punctate epithelial erosions; DES dry eye syndrome; MGD meibomian gland dysfunction; ATs artificial tears; PFAT's preservative free artificial tears; [place] nuclear sclerotic cataract; PSC posterior subcapsular cataract; ERM epi-retinal membrane; PVD posterior vitreous detachment; RD retinal detachment; DM diabetes mellitus; DR diabetic retinopathy; NPDR non-proliferative diabetic retinopathy; PDR proliferative diabetic retinopathy; CSME clinically significant macular edema; DME diabetic macular edema; dbh dot blot hemorrhages; CWS cotton wool spot; POAG primary open angle glaucoma; C/D cup-to-disc ratio; HVF humphrey visual field; GVF goldmann visual field; OCT optical coherence tomography; IOP intraocular pressure; BRVO Branch retinal vein occlusion; CRVO central retinal vein occlusion; CRAO central retinal artery occlusion; BRAO branch retinal artery occlusion;  RT retinal tear; SB scleral buckle; PPV pars plana vitrectomy; VH Vitreous hemorrhage; PRP panretinal laser photocoagulation; IVK intravitreal kenalog; VMT vitreomacular traction; MH Macular hole;  NVD neovascularization of the disc; NVE neovascularization elsewhere; AREDS age related eye disease study; ARMD age related macular degeneration; POAG primary open angle glaucoma; EBMD epithelial/anterior basement membrane dystrophy; ACIOL anterior chamber intraocular lens; IOL intraocular lens; PCIOL posterior chamber intraocular lens; Phaco/IOL phacoemulsification with intraocular lens placement; [place] photorefractive keratectomy; LASIK laser assisted in situ keratomileusis; HTN hypertension; DM diabetes mellitus; COPD chronic obstructive pulmonary disease

## 2021-12-31 ENCOUNTER — Encounter (INDEPENDENT_AMBULATORY_CARE_PROVIDER_SITE_OTHER): Payer: Medicare Other | Admitting: Ophthalmology

## 2021-12-31 ENCOUNTER — Encounter (INDEPENDENT_AMBULATORY_CARE_PROVIDER_SITE_OTHER): Payer: Self-pay | Admitting: Ophthalmology

## 2021-12-31 ENCOUNTER — Ambulatory Visit (INDEPENDENT_AMBULATORY_CARE_PROVIDER_SITE_OTHER): Payer: Medicare Other | Admitting: Ophthalmology

## 2021-12-31 DIAGNOSIS — H34832 Tributary (branch) retinal vein occlusion, left eye, with macular edema: Secondary | ICD-10-CM | POA: Diagnosis not present

## 2021-12-31 DIAGNOSIS — H35352 Cystoid macular degeneration, left eye: Secondary | ICD-10-CM | POA: Diagnosis not present

## 2021-12-31 DIAGNOSIS — H35073 Retinal telangiectasis, bilateral: Secondary | ICD-10-CM

## 2021-12-31 DIAGNOSIS — H35351 Cystoid macular degeneration, right eye: Secondary | ICD-10-CM | POA: Diagnosis not present

## 2021-12-31 DIAGNOSIS — I2510 Atherosclerotic heart disease of native coronary artery without angina pectoris: Secondary | ICD-10-CM

## 2021-12-31 MED ORDER — BEVACIZUMAB CHEMO INJECTION 1.25MG/0.05ML SYRINGE FOR KALEIDOSCOPE
2.5000 mg | INTRAVITREAL | Status: AC | PRN
  Administered 2021-12-31: 2.5 mg via INTRAVITREAL

## 2021-12-31 NOTE — Assessment & Plan Note (Signed)
OS CME worsened despite retroseptal Kenalog and topical steroid.  Thus this may in fact be a twig branch retinal vein occlusion Of the macula and we will attempt.  Empiric therapy left eye with intravitreal Avastin.

## 2021-12-31 NOTE — Assessment & Plan Note (Signed)
This condition improving on topical NSAIDs alone.  Thus we will not deliver retroseptal Kenalog OD today

## 2021-12-31 NOTE — Assessment & Plan Note (Signed)
Patient continues on full-time oxygen.  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  Magda Paganini is on the best treatment possible.  We will still treat each eye as pseudophakic component of CME use topical ketorolac for the possible superimposed CME

## 2021-12-31 NOTE — Progress Notes (Signed)
12/31/2021     CHIEF COMPLAINT Patient presents for  Chief Complaint  Patient presents with   Cystoid Macular Edema      HISTORY OF PRESENT ILLNESS: Maria Arroyo is a 71 y.o. female who presents to the clinic today for:   HPI   20 days for retroseptal kenalog, OD, DILATE OU, OCT. Pt stated vision has remained stable since last visit.  Last edited by Silvestre Moment on 12/31/2021 10:45 AM.      Referring physician: Warden Fillers, MD [address] [place],  [place] [id number]  HISTORICAL INFORMATION:   Selected notes from the MEDICAL RECORD NUMBER       CURRENT MEDICATIONS: Current Outpatient Medications (Ophthalmic Drugs)  Medication Sig   ketorolac (ACULAR) 0.4 % SOLN Place 1 drop into the left eye 4 (four) times daily.   Prednisol Ace-Moxiflox-Bromfen 1-0.5-0.075 % SUSP Place 1 drop into the right eye 4 (four) times daily.   prednisoLONE acetate (PRED FORTE) 1 % ophthalmic suspension Place 1 drop into the left eye 4 (four) times daily.   No current facility-administered medications for this visit. (Ophthalmic Drugs)   Current Outpatient Medications (Other)  Medication Sig   acetaminophen (TYLENOL) 500 MG tablet Take 2 tablets (1,000 mg total) by mouth every 6 (six) hours. (Patient not taking: Reported on 08/28/2021)   allopurinol (ZYLOPRIM) 100 MG tablet Take 100 mg by mouth daily.    amiodarone (PACERONE) 200 MG tablet Take 1 tablet (200 mg total) by mouth daily.   apixaban (ELIQUIS) 5 MG TABS tablet Take 1 tablet (5 mg total) by mouth 2 (two) times daily.   Ascorbic Acid (VITAMIN C PO) Take 1,000 mg by mouth daily.   bisoprolol (ZEBETA) 5 MG tablet Take 0.5 tablets (2.5 mg total) by mouth daily.   busPIRone (BUSPAR) 15 MG tablet Take 15 mg by mouth 2 (two) times daily.   Calcium Carbonate-Vitamin D (CALCIUM 600 + D PO) Take 1 tablet by mouth daily.   carisoprodol (SOMA) 350 MG tablet Take 1 tablet by mouth 4 (four) times daily as needed for muscle  spasms. (Patient not taking: Reported on 08/28/2021)   Cholecalciferol (VITAMIN D) 2000 UNITS tablet Take 4,000 Units by mouth daily.   gabapentin (NEURONTIN) 600 MG tablet TAKE 1 TABLET(600 MG) BY MOUTH FOUR TIMES DAILY AT 8 AM AND AT 1 PM AND AT 6 PM AND AT 10 PM   hydrochlorothiazide (MICROZIDE) 12.5 MG capsule Take 12.5 mg by mouth daily.   ibuprofen (ADVIL) 200 MG tablet Take 200 mg by mouth every 6 (six) hours as needed for headache.   indomethacin (INDOCIN SR) 75 MG CR capsule  (Patient not taking: Reported on 08/28/2021)   Multiple Vitamin (MULTIVITAMIN WITH MINERALS) TABS tablet Take 1 tablet by mouth daily.   nicotine (NICODERM CQ - DOSED IN MG/24 HOURS) 21 mg/24hr patch Place 1 patch (21 mg total) onto the skin daily. (Patient not taking: Reported on 08/28/2021)   nitroGLYCERIN (NITROSTAT) 0.4 MG SL tablet Place 1 tablet (0.4 mg total) under the tongue every 5 (five) minutes as needed. For chest pain (Patient not taking: Reported on 08/28/2021)   oxyCODONE-acetaminophen (PERCOCET) 10-325 MG tablet Take 1 tablet by mouth 4 (four) times daily as needed for pain.   rosuvastatin (CRESTOR) 40 MG tablet TAKE 1 TABLET(40 MG) BY MOUTH DAILY   torsemide (DEMADEX) 20 MG tablet TAKE 1 TABLET BY MOUTH DAILY   traZODone (DESYREL) 50 MG tablet    venlafaxine XR (EFFEXOR-XR)  75 MG 24 hr capsule Take 75 mg by mouth 2 (two) times daily.    No current facility-administered medications for this visit. (Other)      REVIEW OF SYSTEMS: ROS   Negative for: Constitutional, Gastrointestinal, Neurological, Skin, Genitourinary, Musculoskeletal, HENT, Endocrine, Cardiovascular, Eyes, Respiratory, Psychiatric, Allergic/Imm, Heme/Lymph Last edited by Silvestre Moment on 12/31/2021 10:45 AM.       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 11/07/2018   COPD (chronic obstructive pulmonary disease) ([place])    Coronary artery disease    Depression    Gout    R foot and knee   Hyperlipidemia    Hypertension    Left foot drop    Neuropathy    OA (osteoarthritis)    Pica    Tobacco abuse    Past Surgical History:  Procedure Laterality Date   ABDOMINAL HYSTERECTOMY     ANTERIOR CERVICAL DECOMP/DISCECTOMY FUSION  02/06/2011   Procedure: ANTERIOR CERVICAL DECOMPRESSION/DISCECTOMY FUSION 2 LEVELS;  Surgeon: Winfield Cunas;  Location: [facility];  Service: Neurosurgery;  Laterality: N/A;  Anterior Cervical Four-Five/Five-Six Decompression and Fusion with Plating and Bonegraft   BLADDER SURGERY     CARDIAC CATHETERIZATION  04/09/2009   EF 60%   CARDIAC CATHETERIZATION  03/24/2001   EF 55%   CARDIAC CATHETERIZATION  01/06/2001   EF 65%   CHOLECYSTECTOMY     CORONARY STENT PLACEMENT  03/2009   STENTING OF THE PROXIMAL TO MID RIGHT CORONARY   IR THORACENTESIS ASP PLEURAL SPACE W/IMG GUIDE  06/01/2018   LEFT HEART CATHETERIZATION WITH CORONARY ANGIOGRAM N/A 12/21/2011   Procedure: LEFT HEART CATHETERIZATION WITH CORONARY ANGIOGRAM;  Surgeon: Peter M Martinique, MD;  Location: [facility] CATH LAB;  Service: Cardiovascular;  Laterality: N/A;   LOBECTOMY     NODE DISSECTION N/A 04/04/2018   Procedure: NODE DISSECTION;  Surgeon: Melrose Nakayama, MD;  Location: [facility];  Service: Thoracic;  Laterality: N/A;   OTHER SURGICAL HISTORY  12/2014   R foot cyst removal   VIDEO ASSISTED THORACOSCOPY (VATS)/ LOBECTOMY Right 04/04/2018   Procedure: VIDEO ASSISTED THORACOSCOPY (VATS)/RIGHT LOWER LOBECTOMY;  Surgeon: Melrose Nakayama, MD;  Location: [facility];  Service: Thoracic;  Laterality: Right;    FAMILY HISTORY Family History  Problem Relation Age of Onset    Aneurysm Mother    Heart attack Father    Heart failure Father    Stroke Sister    Heart attack Brother     SOCIAL HISTORY Social History   Tobacco Use   Smoking status: Every Day    Packs/day: 2.00    Years: 35.00    Total pack years: 70.00    Types: Cigarettes   Smokeless tobacco: Never   Tobacco comments:    1-1.5 ppd (10/02/13), 03/17/18 2 PPD  Vaping Use   Vaping Use: Never used  Substance Use Topics   Alcohol use: No    Alcohol/week: 0.0 standard drinks of alcohol   Drug use: No         OPHTHALMIC EXAM:  Base Eye Exam     Visual  Acuity (ETDRS)       Right Left   Dist [place] 20/25 -1 20/60 -2 +2   Dist ph [place]  NI         Tonometry (Tonopen, 10:50 AM)       Right Left   Pressure 16 18         Pupils       Pupils APD   Right PERRL None   Left PERRL None         Visual Fields       Left Right    Full Full         Extraocular Movement       Right Left    Full, Ortho Full, Ortho         Neuro/Psych     Oriented x3: Yes         Dilation     Both eyes: 1.0% Mydriacyl, 2.5% Phenylephrine @ 10:50 AM           Slit Lamp and Fundus Exam     External Exam       Right Left   External Normal Normal         Slit Lamp Exam       Right Left   Lids/Lashes Normal Normal   Conjunctiva/Sclera White and quiet White and quiet   Cornea Clear Clear   Anterior Chamber Deep and quiet Deep and quiet   Iris Round and reactive Round and reactive   Lens Centered posterior chamber intraocular lens Centered posterior chamber intraocular lens   Anterior Vitreous Normal Normal         Fundus Exam       Right Left   Posterior Vitreous Posterior vitreous detachment Posterior vitreous detachment   Disc Splinter hemorrhage and for temporal margin of nerve Normal   C/D Ratio 0.3 0.35   Macula Sector pattern of microaneurysms along the macula looks like small twig BVO, dot hemorrhages well Microaneurysms clustered temporal macula and  dot hemorrhage in the macula   Vessels Twig BRVO macula Normal   Periphery Normal Normal            IMAGING AND PROCEDURES  Imaging and Procedures for 12/31/21  OCT, Retina - OU - Both Eyes       Right Eye Quality was good. Scan locations included subfoveal. Central Foveal Thickness: 408. Progression has improved. Findings include abnormal foveal contour, cystoid macular edema.   Left Eye Quality was good. Scan locations included subfoveal. Central Foveal Thickness: 499. Progression has worsened. Findings include abnormal foveal contour, cystoid macular edema.   Notes Incidental posterior vitreous detachment OU.  Small perifoveal operculum OD  Small serous subfoveal detachment OU.  As well.  Patient is on full-time oxygen.  OD, IMPROVED, OS, WORSENED despite st kenalog OS     Intravitreal Injection, Pharmacologic Agent - OS - Left Eye       Time Out 12/31/2021. 11:44 AM. Confirmed correct patient, procedure, site, and patient consented.   Anesthesia Topical anesthesia was used. Anesthetic medications included Lidocaine 4%.   Procedure Preparation included 5% betadine to ocular surface, 10% betadine to eyelids. A 30 gauge needle was used.   Injection: 2.5 mg Bevacizumab 1.86m/0.05ml   Route: Intravitreal, Site: Left Eye   NDC: [id number] Lot: [id number]  Post-op Post injection exam found visual acuity of at least counting fingers. The patient tolerated the procedure well. There were no complications. The patient received written and verbal post procedure care education.  Post injection medications included ocuflox.              ASSESSMENT/PLAN:  Retinal telangiectasia of both eyes Patient continues on full-time oxygen.  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  Magda Paganini is on the best treatment possible.  We will still treat each eye as pseudophakic component of CME use topical ketorolac for the possible superimposed CME  Cystoid macular  edema, left eye OS CME worsened despite retroseptal Kenalog and topical steroid.  Thus this may in fact be a twig branch retinal vein occlusion Of the macula and we will attempt.  Empiric therapy left eye with intravitreal Avastin.  Branch retinal vein occlusion with macular edema of left eye CME OS is worsened when treatment with topical NSAIDs and periocular steroids in place thus this is most likely a macular twig BVO and will treat with empiric antivegF therapy, Avastin OS  Cystoid macular edema of right eye This condition improving on topical NSAIDs alone.  Thus we will not deliver retroseptal Kenalog OD today     ICD-10-CM   1. Branch retinal vein occlusion with macular edema of left eye  H34.8320 Intravitreal Injection, Pharmacologic Agent - OS - Left Eye    Bevacizumab (AVASTIN) SOLN 2.5 mg    2. Cystoid macular edema of right eye  H35.351 OCT, Retina - OU - Both Eyes    CANCELED: OCT, Retina - OU - Both Eyes    3. Retinal telangiectasia of both eyes  H35.073     4. Cystoid macular edema, left eye  H35.352       1.  OD continue on topical medications ketorolac right eye 4 times daily  2.  OS continue on topical ketorolac 4 times daily  3.  The OS has worsened despite treatment with retroseptal Kenalog and topical NSAIDs thus we will treat with antivegF intravitreal today.  Ophthalmic Meds Ordered this visit:  Meds ordered this encounter  Medications   Bevacizumab (AVASTIN) SOLN 2.5 mg       Return in about 8 weeks (around 02/25/2022) for DILATE OU, AVASTIN OCT, OS.  There are no Patient Instructions on file for this visit.   Explained the diagnoses, plan, and follow up with the patient and they expressed understanding.  Patient expressed understanding of the importance of proper follow up care.   Clent Demark Patrick Salemi M.D. Diseases & Surgery of the Retina and Vitreous Retina & Diabetic [place] 12/31/21     Abbreviations: M myopia (nearsighted); A astigmatism; H  hyperopia (farsighted); P presbyopia; Mrx spectacle prescription;  CTL contact lenses; OD right eye; OS left eye; OU both eyes  XT exotropia; ET esotropia; PEK punctate epithelial keratitis; PEE punctate epithelial erosions; DES dry eye syndrome; MGD meibomian gland dysfunction; ATs artificial tears; PFAT's preservative free artificial tears; [place] nuclear sclerotic cataract; PSC posterior subcapsular cataract; ERM epi-retinal membrane; PVD posterior vitreous detachment; RD retinal detachment; DM diabetes mellitus; DR diabetic retinopathy; NPDR non-proliferative diabetic retinopathy; PDR proliferative diabetic retinopathy; CSME clinically significant macular edema; DME diabetic macular edema; dbh dot blot hemorrhages; CWS cotton wool spot; POAG primary open angle glaucoma; C/D cup-to-disc ratio; HVF humphrey visual field; GVF goldmann visual field; OCT optical coherence tomography; IOP intraocular pressure; BRVO Branch retinal vein occlusion; CRVO central retinal vein occlusion; CRAO central retinal artery occlusion; BRAO branch retinal artery occlusion; RT retinal tear; SB scleral buckle; PPV pars plana vitrectomy; VH Vitreous hemorrhage; PRP panretinal laser photocoagulation; IVK intravitreal kenalog; VMT vitreomacular traction; MH Macular hole;  NVD  neovascularization of the disc; NVE neovascularization elsewhere; AREDS age related eye disease study; ARMD age related macular degeneration; POAG primary open angle glaucoma; EBMD epithelial/anterior basement membrane dystrophy; ACIOL anterior chamber intraocular lens; IOL intraocular lens; PCIOL posterior chamber intraocular lens; Phaco/IOL phacoemulsification with intraocular lens placement; [place] photorefractive keratectomy; LASIK laser assisted in situ keratomileusis; HTN hypertension; DM diabetes mellitus; COPD chronic obstructive pulmonary disease

## 2021-12-31 NOTE — Assessment & Plan Note (Signed)
CME OS is worsened when treatment with topical NSAIDs and periocular steroids in place thus this is most likely a macular twig BVO and will treat with empiric antivegF therapy, Avastin OS

## 2022-02-25 ENCOUNTER — Encounter (INDEPENDENT_AMBULATORY_CARE_PROVIDER_SITE_OTHER): Payer: Medicare Other | Admitting: Ophthalmology

## 2022-02-27 ENCOUNTER — Ambulatory Visit (HOSPITAL_COMMUNITY): Admission: RE | Admit: 2022-02-27 | Payer: Medicare Other | Source: Ambulatory Visit

## 2022-03-03 ENCOUNTER — Ambulatory Visit (HOSPITAL_COMMUNITY)
Admission: RE | Admit: 2022-03-03 | Discharge: 2022-03-03 | Disposition: A | Discharge status: Home / Self Care | Payer: Medicare Other | Source: Ambulatory Visit | Attending: Internal Medicine | Admitting: Internal Medicine

## 2022-03-03 DIAGNOSIS — C3490 Malignant neoplasm of unspecified part of unspecified bronchus or lung: Secondary | ICD-10-CM | POA: Diagnosis present

## 2022-04-16 ENCOUNTER — Other Ambulatory Visit (HOSPITAL_COMMUNITY): Payer: Self-pay

## 2022-04-17 ENCOUNTER — Other Ambulatory Visit (HOSPITAL_COMMUNITY): Payer: Self-pay

## 2022-04-17 MED ORDER — OXYCODONE-ACETAMINOPHEN 10-325 MG PO TABS
1.0000 | ORAL_TABLET | Freq: Four times a day (QID) | ORAL | 0 refills | Status: DC
  Filled 2022-04-17: qty 120, 30d supply, fill #0

## 2022-05-18 ENCOUNTER — Other Ambulatory Visit (HOSPITAL_COMMUNITY): Payer: Self-pay

## 2022-05-18 MED ORDER — OXYCODONE-ACETAMINOPHEN 10-325 MG PO TABS
1.0000 | ORAL_TABLET | Freq: Four times a day (QID) | ORAL | 0 refills | Status: DC
  Filled 2022-05-18: qty 120, 30d supply, fill #0

## 2022-05-19 ENCOUNTER — Other Ambulatory Visit (HOSPITAL_COMMUNITY): Payer: Self-pay

## 2022-05-23 ENCOUNTER — Other Ambulatory Visit: Payer: Self-pay | Admitting: Cardiology

## 2022-05-23 DIAGNOSIS — I2510 Atherosclerotic heart disease of native coronary artery without angina pectoris: Secondary | ICD-10-CM

## 2022-05-26 ENCOUNTER — Other Ambulatory Visit: Payer: Self-pay

## 2022-05-26 DIAGNOSIS — I2510 Atherosclerotic heart disease of native coronary artery without angina pectoris: Secondary | ICD-10-CM

## 2022-05-26 MED ORDER — ROSUVASTATIN CALCIUM 40 MG PO TABS: ORAL_TABLET | ORAL | 1 refills | Status: DC

## 2022-05-29 ENCOUNTER — Other Ambulatory Visit: Payer: Self-pay | Admitting: *Deleted

## 2022-05-29 DIAGNOSIS — I2510 Atherosclerotic heart disease of native coronary artery without angina pectoris: Secondary | ICD-10-CM

## 2022-06-17 ENCOUNTER — Other Ambulatory Visit (HOSPITAL_COMMUNITY): Payer: Self-pay

## 2022-06-18 ENCOUNTER — Other Ambulatory Visit (HOSPITAL_COMMUNITY): Payer: Self-pay

## 2022-06-18 MED ORDER — OXYCODONE-ACETAMINOPHEN 10-325 MG PO TABS
1.0000 | ORAL_TABLET | Freq: Four times a day (QID) | ORAL | 0 refills | Status: DC | PRN
  Filled 2022-06-18: qty 120, 30d supply, fill #0

## 2022-07-20 ENCOUNTER — Other Ambulatory Visit (HOSPITAL_COMMUNITY): Payer: Self-pay

## 2022-07-20 MED ORDER — OXYCODONE-ACETAMINOPHEN 10-325 MG PO TABS
1.0000 | ORAL_TABLET | Freq: Four times a day (QID) | ORAL | 0 refills | Status: DC | PRN
  Filled 2022-07-20: qty 120, 30d supply, fill #0

## 2022-07-21 ENCOUNTER — Other Ambulatory Visit (HOSPITAL_COMMUNITY): Payer: Self-pay

## 2022-07-31 ENCOUNTER — Other Ambulatory Visit: Payer: Self-pay | Admitting: Cardiology

## 2022-07-31 DIAGNOSIS — I2510 Atherosclerotic heart disease of native coronary artery without angina pectoris: Secondary | ICD-10-CM

## 2022-08-18 NOTE — Progress Notes (Deleted)
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 11/07/2018   COPD (chronic obstructive pulmonary disease) ([facility])    Coronary artery disease    Depression    Gout    R foot and knee   Hyperlipidemia    Hypertension    Left foot drop    Neuropathy    OA (osteoarthritis)    Pica    Tobacco abuse    Past Surgical History:  Procedure Laterality Date   ABDOMINAL HYSTERECTOMY     ANTERIOR CERVICAL DECOMP/DISCECTOMY FUSION  02/06/2011   Procedure: ANTERIOR CERVICAL DECOMPRESSION/DISCECTOMY FUSION 2 LEVELS;  Surgeon: Carmela Hurt;  Location: [facility];  Service: Neurosurgery;  Laterality: N/A;  Anterior Cervical Four-Five/Five-Six Decompression and Fusion with Plating and Bonegraft   BLADDER SURGERY     CARDIAC CATHETERIZATION  04/09/2009   EF 60%   CARDIAC CATHETERIZATION  03/24/2001   EF 55%   CARDIAC CATHETERIZATION  01/06/2001   EF 65%   CHOLECYSTECTOMY     CORONARY STENT PLACEMENT  03/2009   STENTING OF THE PROXIMAL TO MID RIGHT CORONARY   IR THORACENTESIS ASP PLEURAL SPACE W/IMG GUIDE  06/01/2018   LEFT HEART CATHETERIZATION WITH CORONARY ANGIOGRAM N/A 12/21/2011   Procedure: LEFT HEART CATHETERIZATION WITH CORONARY ANGIOGRAM;  Surgeon: Peter M Swazil[place], MD;  [facility] LAB;  Service: Cardiovascular;  Laterality: N/A;   LOBECTOMY     NODE DISSECTION N/A 04/04/2018   Procedure: NODE DISSECTION;  Surgeon: Loreli Slot, MD;  Location: [facility] OR;  Service: Thoracic;  Laterality: N/A;   OTHER SURGICAL HISTORY  12/2014   R foot cyst removal   VIDEO ASSISTED THORACOSCOPY (VATS)/ LOBECTOMY Right 04/04/2018   Procedure: VIDEO ASSISTED THORACOSCOPY  (VATS)/RIGHT LOWER LOBECTOMY;  Surgeon: Loreli Slot, MD;  Location: [facility] OR;  Service: Thoracic;  Laterality: Right;    Allergies  Allergies  Allergen Reactions   Ace Inhibitors Other (See Comments)    Worsening renal function    Codeine Nausea Only   Codeine-Guaifenesin [Guaifenesin-Codeine] Nausea Only and Other (See Comments)    Some nausea--but ok with percocet    History of Present Illness    ***  Home Medications    Prior to Admission medications   Medication Sig Start Date End Date Taking? Authorizing Provider  acetaminophen (TYLENOL) 500 MG tablet Take 2 tablets (1,000 mg total) by mouth every 6 (six) hours. Patient not taking: Reported on 08/28/2021 04/09/18   Sharlene Dory, PA-C  allopurinol (ZYLOPRIM) 100 MG tablet Take 100 mg by mouth daily.     [provider]  amiodarone (PACERONE) 200 MG tablet Take 1 tablet (200 mg total) by mouth daily. 05/19/21   [place], Peter M, MD  apixaban (ELIQUIS) 5 MG TABS tablet Take 1 tablet (5 mg total) by mouth 2 (two) times daily. 05/19/21   [place], Peter M, MD  Ascorbic Acid (VITAMIN C PO) Take 1,000 mg by mouth daily.    [provider]  bisoprolol (ZEBETA) 5 MG tablet Take 0.5 tablets (2.5 mg total) by mouth daily. 06/02/21   [place], Peter M, MD  busPIRone (BUSPAR) 15  MG tablet Take 15 mg by mouth 2 (two) times daily.    [provider]  Calcium Carbonate-Vitamin D (CALCIUM 600 + D PO) Take 1 tablet by mouth daily.    [provider]  carisoprodol (SOMA) 350 MG tablet Take 1 tablet by mouth 4 (four) times daily as needed for muscle spasms. Patient not taking: Reported on 08/28/2021 04/15/18   [provider]  Cholecalciferol (VITAMIN D) 2000 UNITS tablet Take 4,000 Units by mouth daily.    [provider]  gabapentin (NEURONTIN) 600 MG tablet TAKE 1 TABLET(600 MG) BY MOUTH FOUR TIMES DAILY AT 8 AM AND AT 1 PM AND AT 6 PM AND AT 10 PM 07/22/20   York Spaniel, MD   hydrochlorothiazide (MICROZIDE) 12.5 MG capsule Take 12.5 mg by mouth daily. 07/06/21   [provider]  ibuprofen (ADVIL) 200 MG tablet Take 200 mg by mouth every 6 (six) hours as needed for headache.    [provider]  indomethacin (INDOCIN SR) 75 MG CR capsule  04/05/19   [provider]  ketorolac (ACULAR) 0.4 % SOLN Place 1 drop into the left eye 4 (four) times daily. 08/22/21   [provider]  Multiple Vitamin (MULTIVITAMIN WITH MINERALS) TABS tablet Take 1 tablet by mouth daily. 08/30/18   Roberto Scales D, MD  nicotine (NICODERM CQ - DOSED IN MG/24 HOURS) 21 mg/24hr patch Place 1 patch (21 mg total) onto the skin daily. Patient not taking: Reported on 08/28/2021 08/30/18   Roberto Scales D, MD  nitroGLYCERIN (NITROSTAT) 0.4 MG SL tablet PLACE 1 TABLET UNDER THE TONGUE EVERY 5 MINUTES AS NEEDED CHEST PAIN. 05/25/22   [place], Peter M, MD  oxyCODONE-acetaminophen (PERCOCET) 10-325 MG tablet Take 1 tablet by mouth 4 (four) times daily as needed for pain. 04/13/18   [provider]  oxyCODONE-acetaminophen (PERCOCET) 10-325 MG tablet Take 1 tablet by mouth every 6 hours as needed for pain. Max daily amount: 4 tablets 07/20/22     Prednisol Ace-Moxiflox-Bromfen 1-0.5-0.075 % SUSP Place 1 drop into the right eye 4 (four) times daily. 06/23/21   [provider]  prednisoLONE acetate (PRED FORTE) 1 % ophthalmic suspension Place 1 drop into the left eye 4 (four) times daily. 07/04/21   [provider]  rosuvastatin (CRESTOR) 40 MG tablet TAKE 1 TABLET BY MOUTH DAILY 07/31/22   [place], Peter M, MD  torsemide (DEMADEX) 20 MG tablet TAKE 1 TABLET BY MOUTH DAILY 04/13/19   [provider]  traZODone (DESYREL) 50 MG tablet  01/25/19   [provider]  venlafaxine XR (EFFEXOR-XR) 75 MG 24 hr capsule Take 75 mg by mouth 2 (two) times daily.     [provider]    Family History    Family History  Problem Relation Age of Onset    Aneurysm Mother    Heart attack Father    Heart failure Father    Stroke Sister    Heart attack Brother    She indicated that her mother is deceased. She indicated that her father is deceased. She indicated that two of her three sisters are alive. She indicated that all of her three brothers are deceased. She indicated that her maternal grandmother is deceased. She indicated that her maternal grandfather is deceased. She indicated that her paternal grandmother is deceased. She indicated that her paternal grandfather is deceased.  Social History    Social History   Socioeconomic History   Marital status: Married  Spouse name: Not on file   Number of children: 2   Years of education: 10   Highest education level: Not on file  Occupational History   Occupation: housewife    Employer: UNEMPLOYED  Tobacco Use   Smoking status: Every Day    Packs/day: 2.00    Years: 35.00    Additional pack years: 0.00    Total pack years: 70.00    Types: Cigarettes   Smokeless tobacco: Never   Tobacco comments:    1-1.5 ppd (10/02/13), 03/17/18 2 PPD  Vaping Use   Vaping Use: Never used  Substance and Sexual Activity   Alcohol use: No    Alcohol/week: 0.0 standard drinks of alcohol   Drug use: No   Sexual activity: Not on file  Other Topics Concern   Not on file  Social History Narrative   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   The patient has a 10th grade education.   The patient is left-handed.   The patient has two children.   Patient drinks three cups of soda daily.       Social Determinants of Health   Financial Resource Strain: Not on file  Food Insecurity: Not on file  Transportation Needs: Not on file  Physical Activity: Not on file  Stress: Not on file  Social Connections: Not on file  Intimate Partner Violence: Not on file     Review of Systems    General:  No chills, fever, night sweats or weight changes.  Cardiovascular:  No chest pain,  dyspnea on exertion, edema, orthopnea, palpitations, paroxysmal nocturnal dyspnea. Dermatological: No rash, lesions/masses Respiratory: No cough, dyspnea Urologic: No hematuria, dysuria Abdominal:   No nausea, vomiting, diarrhea, bright red blood per rectum, melena, or hematemesis Neurologic:  No visual changes, wkns, changes in mental status. All other systems reviewed and are otherwise negative except as noted above.  Physical Exam    VS:  There were no vitals taken for this visit. , BMI There is no height or weight on file to calculate BMI. GEN: Well nourished, well developed, in no acute distress. HEENT: normal. Neck: Supple, no JVD, carotid bruits, or masses. Cardiac: RRR, no murmurs, rubs, or gallops. No clubbing, cyanosis, edema.  Radials/DP/PT 2+ and equal bilaterally.  Respiratory:  Respirations regular and unlabored, clear to auscultation bilaterally. GI: Soft, nontender, nondistended, BS + x 4. MS: no deformity or atrophy. Skin: warm and dry, no rash. Neuro:  Strength and sensation are intact. Psych: Normal affect.  Accessory Clinical Findings    Recent Labs: 08/26/2021: ALT 17; BUN 26; Creatinine 1.43; Hemoglobin 11.4; Platelet Count 238; Potassium 4.5; Sodium 137   Recent Lipid Panel No results found for: "CHOL", "TRIG", "HDL", "CHOLHDL", "VLDL", "LDLCALC", "LDLDIRECT"  No BP recorded.  {Refresh Note OR Click here to enter BP  :1}***    ECG personally reviewed by me today- *** - No acute changes      Assessment & Plan   1.  ***   Thomasene Ripple. Ettamae Barkett NP-C     08/18/2022, 1:08 PM [facility] [address] Office [phone] Fax [phone]    I spent***minutes examining this patient, reviewing medications, and using patient centered shared decision making involving her cardiac care.  Prior to her visit I spent greater than 20 minutes reviewing her past medical history,  medications, and prior cardiac tests.

## 2022-08-21 ENCOUNTER — Ambulatory Visit: Payer: Medicare Other | Admitting: General Practice

## 2022-08-25 ENCOUNTER — Other Ambulatory Visit: Payer: Self-pay | Admitting: Cardiology

## 2022-08-25 DIAGNOSIS — I2510 Atherosclerotic heart disease of native coronary artery without angina pectoris: Secondary | ICD-10-CM

## 2022-08-26 ENCOUNTER — Other Ambulatory Visit: Payer: Self-pay | Admitting: Cardiology

## 2022-08-26 DIAGNOSIS — I2510 Atherosclerotic heart disease of native coronary artery without angina pectoris: Secondary | ICD-10-CM

## 2022-08-27 ENCOUNTER — Other Ambulatory Visit: Payer: Self-pay | Admitting: Cardiology

## 2022-08-27 DIAGNOSIS — I2510 Atherosclerotic heart disease of native coronary artery without angina pectoris: Secondary | ICD-10-CM

## 2022-09-30 ENCOUNTER — Ambulatory Visit: Payer: Medicare Other | Admitting: General Practice

## 2022-10-05 ENCOUNTER — Other Ambulatory Visit: Payer: Self-pay | Admitting: Cardiology

## 2022-10-05 DIAGNOSIS — I2510 Atherosclerotic heart disease of native coronary artery without angina pectoris: Secondary | ICD-10-CM

## 2022-10-06 ENCOUNTER — Other Ambulatory Visit: Payer: Self-pay | Admitting: Cardiology

## 2022-10-06 DIAGNOSIS — I2510 Atherosclerotic heart disease of native coronary artery without angina pectoris: Secondary | ICD-10-CM

## 2022-11-03 NOTE — Progress Notes (Deleted)
Cardiology Clinic Note   Patient Name: Maria Arroyo Date of Encounter: 11/03/2022  Primary Care Provider:  Eartha Inch, MD Primary Cardiologist:  Peter Swaziland, MD  Patient Profile    Maria Arroyo  72 year old female presents to the clinic today for follow-up evaluation of her coronary artery disease, essential hypertension and paroxysmal atrial fibrillation.   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 11/07/2018   COPD (chronic obstructive pulmonary disease) (HCC)    Coronary artery disease    Depression    Gout    R foot and knee   Hyperlipidemia    Hypertension    Left foot drop    Neuropathy    OA (osteoarthritis)    Pica    Tobacco abuse    Past Surgical History:  Procedure Laterality Date   ABDOMINAL HYSTERECTOMY     ANTERIOR CERVICAL DECOMP/DISCECTOMY FUSION  02/06/2011   Procedure: ANTERIOR CERVICAL DECOMPRESSION/DISCECTOMY FUSION 2 LEVELS;  Surgeon: Carmela Hurt;  Location: [facility];  Service: Neurosurgery;  Laterality: N/A;  Anterior Cervical Four-Five/Five-Six Decompression and Fusion with Plating and Bonegraft   BLADDER SURGERY     CARDIAC CATHETERIZATION  04/09/2009   EF 60%   CARDIAC CATHETERIZATION  03/24/2001   EF 55%   CARDIAC CATHETERIZATION  01/06/2001   EF 65%   CHOLECYSTECTOMY     CORONARY STENT PLACEMENT  03/2009   STENTING OF THE PROXIMAL TO MID RIGHT CORONARY   IR THORACENTESIS ASP PLEURAL SPACE W/IMG GUIDE  06/01/2018   LEFT HEART CATHETERIZATION WITH CORONARY ANGIOGRAM N/A 12/21/2011   Procedure: LEFT HEART CATHETERIZATION WITH CORONARY ANGIOGRAM;  Surgeon: Peter M Swaziland, MD;  Location: [facility] CATH LAB;  Service: Cardiovascular;  Laterality: N/A;   LOBECTOMY     NODE DISSECTION N/A 04/04/2018   Procedure: NODE DISSECTION;  Surgeon: Loreli Slot, MD;  Location: [facility] OR;  Service:  Thoracic;  Laterality: N/A;   OTHER SURGICAL HISTORY  12/2014   R foot cyst removal   VIDEO ASSISTED THORACOSCOPY (VATS)/ LOBECTOMY Right 04/04/2018   Procedure: VIDEO ASSISTED THORACOSCOPY (VATS)/RIGHT LOWER LOBECTOMY;  Surgeon: Loreli Slot, MD;  Location: [facility] OR;  Service: Thoracic;  Laterality: Right;    Allergies  Allergies  Allergen Reactions   Ace Inhibitors Other (See Comments)    Worsening renal function    Codeine Nausea Only   Codeine-Guaifenesin [Guaifenesin-Codeine] Nausea Only and Other (See Comments)    Some nausea--but ok with percocet    History of Present Illness    Maria Arroyo is a PMH of hypertension, hyperlipidemia, COPD, tobacco abuse, lung nodule, and coronary artery disease.  She underwent remote stenting of her mid RCA 12/99.  She returned 10/02 and had stenting of her proximal RCA.  1/11 she developed high-grade stenosis in between the prior stents.  She required additional stenting at that time.  She underwent cardiac catheterization 9/13 which showed nonobstructive CAD.  She has been maintained on dual antiplatelet therapy.  She had significant unexplained weight loss.  She underwent workup for occult malignancy.  Her chest x-ray showed lower lobe nodule.  PET scan showed hypermetabolic mass in her right lower lobe.  She was seen by Dr. Dorris Fetch for further evaluation and underwent VATS procedure on 04/04/2018.  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  Her  hospital course was complicated by atrial fibrillation.  She required IV amiodarone.  She converted to sinus rhythm.  She did have a recurrence of atrial fibrillation during her same hospitalization.  She was started on apixaban and discharged on amiodarone.   She was seen in follow-up by Azalee Course PA-C 1/20.  Her amiodarone was 200 mg daily.  She had reduced bisoprolol due to bradycardia.   She was admitted 5/20 with acute respiratory failure and hypoxemia.  She  was noted to have worsening right pleural effusion.  She was noted to have a hemoglobin of 6.7 and required iron and transfusion of PRBCs x 2.  She required IV diuresis and was DC'd on furosemide and HCTZ.  She was placed on home oxygen.  Her echocardiogram showed an EF of 55-60%, impaired relaxation, moderately dilated right atria, mild thickening of mitral valve leaflet, mild-moderate mitral valve regurgitation.   She was seen in follow-up by Dr. Swaziland on 05/19/2021.  During that time she reported that her breathing was okay.  She was on 3 L nasal cannula.  She had gained about 30 pounds.  She denied swelling, cough, chest pain and was still smoking half pack per day.   She presents to the clinic today for follow-up evaluation and states***.   *** denies chest pain, shortness of breath, lower extremity edema, fatigue, palpitations, melena, hematuria, hemoptysis, diaphoresis, weakness, presyncope, syncope, orthopnea, and PND.   Coronary artery disease-no chest pain today.  Has had multiple PCI's with prior stenting to RCA.  On Eliquis no aspirin. Continue current medical therapy Low-sodium high-fiber diet Increase physical activity as tolerated   Paroxysmal atrial fibrillation-heart rate today***.  Reports compliance with apixaban.  Denies bleeding issues. Continue amiodarone, apixaban Avoid triggers caffeine, chocolate, EtOH, dehydration etc.   Hyperlipidemia-LDL***. Continue rosuvastatin Repeat fasting lipids and LFTs High-fiber diet   Essential hypertension-BP today***. Maintain blood pressure log Continue current medical therapy   Chronic diastolic CHF-euvolemic.  Echocardiogram 5/20 showed EF of 50-55% and impaired relaxation with moderately dilated right atria. Repeat echo when clinically indicated   Disposition: Follow-up with Dr. Swaziland or me in 6-9 months.  Home Medications    Prior to Admission medications   Medication Sig Start Date End Date Taking? Authorizing Provider   acetaminophen (TYLENOL) 500 MG tablet Take 2 tablets (1,000 mg total) by mouth every 6 (six) hours. Patient not taking: Reported on 08/28/2021 04/09/18   Sharlene Dory, PA-C  allopurinol (ZYLOPRIM) 100 MG tablet Take 100 mg by mouth daily.     [provider]  amiodarone (PACERONE) 200 MG tablet Take 1 tablet (200 mg total) by mouth daily. 05/19/21   Swaziland, Peter M, MD  apixaban (ELIQUIS) 5 MG TABS tablet Take 1 tablet (5 mg total) by mouth 2 (two) times daily. 05/19/21   [place]iland, Peter M, MD  Ascorbic Acid (VITAMIN C PO) Take 1,000 mg by mouth daily.    [provider]  bisoprolol (ZEBETA) 5 MG tablet TAKE 1/2 TABLET(2.5 MG) BY MOUTH DAILY 08/26/22   Swaziland, Peter M, MD  busPIRone (BUSPAR) 15 MG tablet Take 15 mg by mouth 2 (two) times daily.    [provider]  Calcium Carbonate-Vitamin D (CALCIUM 600 + D PO) Take 1 tablet by mouth daily.    [provider]  carisoprodol (SOMA) 350 MG tablet Take 1 tablet by mouth 4 (four) times daily as needed for muscle spasms. Patient not taking: Reported on 08/28/2021 04/15/18   [provider]  Cholecalciferol (  VITAMIN D) 2000 UNITS tablet Take 4,000 Units by mouth daily.    [provider]  gabapentin (NEURONTIN) 600 MG tablet TAKE 1 TABLET(600 MG) BY MOUTH FOUR TIMES DAILY AT 8 AM AND AT 1 PM AND AT 6 PM AND AT 10 PM 07/22/20   York Spaniel, MD  hydrochlorothiazide (MICROZIDE) 12.5 MG capsule Take 12.5 mg by mouth daily. 07/06/21   [provider]  ibuprofen (ADVIL) 200 MG tablet Take 200 mg by mouth every 6 (six) hours as needed for headache.    [provider]  indomethacin (INDOCIN SR) 75 MG CR capsule  04/05/19   [provider]  ketorolac (ACULAR) 0.4 % SOLN Place 1 drop into the left eye 4 (four) times daily. 08/22/21   [provider]  Multiple Vitamin (MULTIVITAMIN WITH MINERALS) TABS tablet Take 1 tablet by mouth daily. 08/30/18   Roberto Scales D, MD  nicotine  (NICODERM CQ - DOSED IN MG/24 HOURS) 21 mg/24hr patch Place 1 patch (21 mg total) onto the skin daily. Patient not taking: Reported on 08/28/2021 08/30/18   Roberto Scales D, MD  nitroGLYCERIN (NITROSTAT) 0.4 MG SL tablet PLACE 1 TABLET UNDER THE TONGUE EVERY 5 MINUTES AS NEEDED CHEST PAIN. 05/25/22   Sw[place]il[place], Peter M, MD  oxyCODONE-acetaminophen (PERCOCET) 10-325 MG tablet Take 1 tablet by mouth 4 (four) times daily as needed for pain. 04/13/18   [provider]  oxyCODONE-acetaminophen (PERCOCET) 10-325 MG tablet Take 1 tablet by mouth every 6 hours as needed for pain. Max daily amount: 4 tablets 07/20/22     Prednisol Ace-Moxiflox-Bromfen 1-0.5-0.075 % SUSP Place 1 drop into the right eye 4 (four) times daily. 06/23/21   [provider]  prednisoLONE acetate (PRED FORTE) 1 % ophthalmic suspension Place 1 drop into the left eye 4 (four) times daily. 07/04/21   [provider]  rosuvastatin (CRESTOR) 40 MG tablet TAKE 1 TABLET BY MOUTH DAILY 10/06/22   Swaziland, Peter M, MD  torsemide (DEMADEX) 20 MG tablet TAKE 1 TABLET BY MOUTH DAILY 04/13/19   [provider]  traZODone (DESYREL) 50 MG tablet  01/25/19   [provider]  venlafaxine XR (EFFEXOR-XR) 75 MG 24 hr capsule Take 75 mg by mouth 2 (two) times daily.     [provider]    Family History    Family History  Problem Relation Age of Onset   Aneurysm Mother    Heart attack Father    Heart failure Father    Stroke Sister    Heart attack Brother    She indicated that her mother is deceased. She indicated that her father is deceased. She indicated that two of her three sisters are alive. She indicated that all of her three brothers are deceased. She indicated that her maternal grandmother is deceased. She indicated that her maternal grandfather is deceased. She indicated that her paternal grandmother is deceased. She indicated that her paternal grandfather is deceased.  Social History    Social  History   Socioeconomic History   Marital status: Married    Spouse name: Not on file   Number of children: 2   Years of education: 10   Highest education level: Not on file  Occupational History   Occupation: housewife    Employer: UNEMPLOYED  Tobacco Use   Smoking status: Every Day    Current packs/day: 2.00    Average packs/day: 2.0 packs/day for 35.0 years (70.0 ttl pk-yrs)    Types: Cigarettes  Smokeless tobacco: Never   Tobacco comments:    1-1.5 ppd (10/02/13), 03/17/18 2 PPD  Vaping Use   Vaping status: Never Used  Substance and Sexual Activity   Alcohol use: No    Alcohol/week: 0.0 standard drinks of alcohol   Drug use: No   Sexual activity: Not on file  Other Topics Concern   Not on file  Social History Narrative   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   The patient has a 10th grade education.   The patient is left-handed.   The patient has two children.   Patient drinks three cups of soda daily.       Social Determinants of Health   Financial Resource Strain: Low Risk  (05/11/2022)   Received from [facility], [facility]   Overall Financial Resource Strain (CARDIA)    Difficulty of Paying Living Expenses: Not hard at all  Food Insecurity: No Food Insecurity (05/11/2022)   Received from [facility], [facility]   Hunger Vital Sign    Worried About Running Out of Food in the Last Year: Never true    Ran Out of Food in the Last Year: Never true  Transportation Needs: No Transportation Needs (05/11/2022)   Received from [facility], [facility]   PRAPARE - Transportation    Lack of Transportation (Medical): No    Lack of Transportation (Non-Medical): No  Physical Activity: Unknown (01/28/2022)   Received from [facility], [facility]   Exercise Vital Sign    Days of Exercise per Week: 0 days    Minutes of Exercise per Session: Not on file  Stress: Stress Concern Present (01/28/2022)   Received from [facility], [facility] - Occupational Stress Questionnaire    Feeling of Stress : To some extent  Social Connections: Somewhat Isolated (01/28/2022)   Received from [facility], [facility]   Social Network    How would you rate your social network (family, work, friends)?: Restricted participation with some degree of social isolation  Intimate Partner Violence: Not At Risk (01/28/2022)   Received from [facility], [facility]   HITS    Over the last 12 months how often did your partner physically hurt you?: 1    Over the last 12 months how often did your partner insult you or talk down to you?: 1    Over the last 12 months how often did your partner threaten you with physical harm?: 1    Over the last 12 months how often did your partner scream or curse at you?: 1     Review of Systems    General:  No chills, fever, night sweats or weight changes.  Cardiovascular:  No chest pain, dyspnea on exertion, edema, orthopnea, palpitations, paroxysmal nocturnal dyspnea. Dermatological: No rash, lesions/masses Respiratory: No cough, dyspnea Urologic: No hematuria, dysuria Abdominal:   No nausea, vomiting, diarrhea, bright red blood per rectum, melena, or hematemesis Neurologic:  No visual changes, wkns, changes in mental status. All other systems reviewed and are otherwise negative except as noted above.  Physical Exam    VS:  There were no vitals taken for this visit. , BMI There is no height or weight on file to calculate BMI. GEN: Well nourished, well developed, in no acute distress. HEENT: normal. Neck: Supple, no JVD, carotid bruits, or masses. Cardiac: RRR, no murmurs, rubs, or gallops. No clubbing, cyanosis, edema.  Radials/DP/PT 2+ and equal bilaterally.  Respiratory:  Respirations regular and unlabored, clear to auscultation bilaterally. GI: Soft, nontender, nondistended, BS + x 4. MS: no deformity or atrophy. Skin: warm and dry, no rash. Neuro:   Strength and sensation are intact. Psych: Normal affect.  Accessory Clinical Findings    Recent Labs: No results found for requested labs within last 365 days.   Recent Lipid Panel No results found for: "CHOL", "TRIG", "HDL", "CHOLHDL", "VLDL", "LDLCALC", "LDLDIRECT"  No BP recorded.  {Refresh Note OR Click here to enter BP  :1}***    ECG personally reviewed by me today- ***    Echocardiogram 08/27/2018       IMPRESSIONS     1. The left ventricle has normal systolic function, with an ejection  fraction of 55-60%. The cavity size was mildly dilated. Left ventricular  diastolic Doppler parameters are consistent with impaired relaxation.   2. The right ventricle has moderately reduced systolic function. The  cavity was mildly enlarged. There is no increase in right ventricular wall  thickness.   3. Poor acoustic windows limit study of RV.   4. Right atrial size was moderately dilated.   5. The mitral valve is abnormal. Mild thickening of the mitral valve  leaflet. Mild calcification of the mitral valve leaflet. Mitral valve  regurgitation is mild to moderate by color flow Doppler.   6. MR is eccentric, directed posterior into LA Mild to moderate in  severity.   7. The aortic valve is abnormal. Mild thickening of the aortic valve.  Moderate calcification of the aortic valve.   8. The interatrial septum was not assessed.   FINDINGS   Left Ventricle: The left ventricle has normal systolic function, with an  ejection fraction of 55-60%. The cavity size was mildly dilated. There is  no increase in left ventricular wall thickness. Left ventricular diastolic  Doppler parameters are  consistent with impaired relaxation.   Right Ventricle: The right ventricle has moderately reduced systolic  function. The cavity was mildly enlarged. There is no increase in right  ventricular wall thickness. Poor acoustic windows limit study of RV.   Left Atrium: Left atrial size was normal in  size.   Right Atrium: Right atrial size was moderately dilated. Right atrial  pressure is estimated at 3 mmHg.   Interatrial Septum: The interatrial septum was not assessed.   Pericardium: There is no evidence of pericardial effusion.   Mitral Valve: The mitral valve is abnormal. Mild thickening of the mitral  valve leaflet. Mild calcification of the mitral valve leaflet. Mitral  valve regurgitation is mild to moderate by color flow Doppler. MR is  eccentric, directed posterior into LA  Mild to moderate in severity.   Tricuspid Valve: The tricuspid valve is normal in structure. Tricuspid  valve regurgitation is mild by color flow Doppler.   Aortic Valve: The aortic valve is abnormal Mild thickening of the aortic  valve. Moderate calcification of the aortic valve. Aortic valve  regurgitation was not visualized by color flow Doppler.   Pulmonic Valve: The pulmonic valve was not well visualized. Pulmonic valve  regurgitation is mild by color flow Doppler.   Venous: The inferior vena cava is normal in size with greater than 50%  respiratory variability.       Assessment & Plan   1.  ***   Thomasene Ripple.  NP-C     11/03/2022, 7:16 AM [facility] [address] Office [phone] Fax [phone]    I spent***minutes examining  this patient, reviewing medications, and using patient centered shared decision making involving her cardiac care.  Prior to her visit I spent greater than 20 minutes reviewing her past medical history,  medications, and prior cardiac tests.

## 2022-11-05 ENCOUNTER — Ambulatory Visit: Payer: Medicare Other | Admitting: General Practice

## 2022-11-08 NOTE — Progress Notes (Deleted)
Cardiology Clinic Note   Date: 11/08/2022 ID: Jontavia, Moorhouse April 23, 1950, MRN [id number]  Primary Cardiologist:  Peter Swaziland, MD  Patient Profile    Maria Arroyo is a 72 y.o. female who presents to the clinic today for ***    Past medical history significant for: CAD. PCI with stent to mid RCA 1999. LHC 01/06/2001 (chest pain): Minor wall irregularities <10% LCx.  80% proximal RCA.  Patent stent with 30% in-stent restenosis mid RCA.  Prior angioplasty distal RCA 10%.  PCI with stent to proximal RCA. LHC 03/24/2001 (chest pain): Proximal RCA 20 to 30% prior to initial stent.  Proximal stent patent.  Patent mid RCA stent with 30% in-stent restenosis.  Nonobstructive CAD. LHC 04/10/2009: Stents noted in proximal RCA and long segment of mid RCA.  At the distal margin of more proximal stent between the 2 stented segments there is a 90% stenosis.  Diffuse disease within the stented segment mid vessel 40 to 50%.  Distal RCA appears normal.  PCI with DES to proximal to mid RCA. LHC 12/21/2011: Nonobstructive CAD.  Patent stents.  Moderate disease throughout mid RCA unchanged from prior catheterization 2011. Chronic diastolic heart failure. Echo 08/27/2018: EF 55 to 60%.  Grade I DD.  Moderately reduced RV function.  Mild RVH.  Moderate RAE.  Mild to moderate MR.  MR is eccentric directed posterior into LA mild to moderate in severity. PAF. Onset January 2020 in the setting of VATS right lower lobectomy. Hypertension. Hyperlipidemia.*** Right lung cancer. COPD. Tobacco abuse.     History of Present Illness    Maria Arroyo is a longtime patient of cardiology.  She is followed by Dr. Swaziland for the above outlined history.  Patient has history of CAD with multiple revascularization procedures as outlined above.  She has a history of right lung cancer.  She underwent VATS right lower lobectomy in January 2020.  Postoperatively she developed A-fib with RVR  and initially converted with IV amiodarone.  She had a recurrence during the same hospitalization and was started on anticoagulation.  Patient was last seen in the office by Dr. Swaziland on 05/19/2021 for routine follow-up.  She was stable from a cardiac standpoint.  She remains on 3 L O2 [place].  She continues to smoke half pack a day.  Today, patient ***  CAD.  Patient with a history of multiple revascularization procedures as outlined in patient profile.  Last [facility] September 2013 showed nonobstructive CAD with patent stents unchanged from prior catheterization in 2011. Patient *** Continue rosuvastatin, bisoprolol, as needed SL NTG.  Not on antiplatelet secondary to Eliquis. Chronic diastolic heart failure.  Echo May 2023 showed normal LV function, moderately reduced RV function, mild RVH, moderate RAE and mild to moderate MR.  Patient*** Euvolemic and well compensated on exam.  Continue bisoprolol, hydrochlorothiazide, torsemide. PAF.  Onset January 2020 in the setting of VATS right lower lobectomy.  Patient*** Denies spontaneous bleeding concerns.  Continue bisoprolol, amiodarone, Eliquis. Hypertension: BP today *** Patient denies headaches, dizziness or vision changes. Continue bisoprolol and hydrochlorothiazide. Hyperlipidemia.*** Tobacco abuse.***   ROS: All other systems reviewed and are otherwise negative except as noted in History of Present Illness.  Studies Reviewed       ***  Risk Assessment/Calculations    {Does this patient have ATRIAL FIBRILLATION?[phone]} No BP recorded.  {Refresh Note OR Click here to enter BP  :1}***        Physical Exam    VS:  There  were no vitals taken for this visit. , BMI There is no height or weight on file to calculate BMI.  GEN: Well nourished, well developed, in no acute distress. Neck: No JVD or carotid bruits. Cardiac: *** RRR. No murmurs. No rubs or gallops.   Respiratory:  Respirations regular and unlabored. Clear to auscultation without  rales, wheezing or rhonchi. GI: Soft, nontender, nondistended. Extremities: Radials/DP/PT 2+ and equal bilaterally. No clubbing or cyanosis. No edema ***  Skin: Warm and dry, no rash. Neuro: Strength intact.  Assessment & Plan   ***  Disposition: ***     {Are you ordering a CV Procedure (e.g. stress test, cath, DCCV, TEE, etc)?   Press F2        :[id number]}   Signed, Etta Grandchild. , DNP, NP-C

## 2022-11-10 ENCOUNTER — Ambulatory Visit: Payer: Medicare Other | Admitting: Student

## 2022-11-10 ENCOUNTER — Other Ambulatory Visit: Payer: Self-pay | Admitting: Cardiology

## 2022-11-10 DIAGNOSIS — I2510 Atherosclerotic heart disease of native coronary artery without angina pectoris: Secondary | ICD-10-CM

## 2022-11-11 ENCOUNTER — Other Ambulatory Visit: Payer: Self-pay | Admitting: Cardiology

## 2022-11-11 ENCOUNTER — Other Ambulatory Visit (HOSPITAL_BASED_OUTPATIENT_CLINIC_OR_DEPARTMENT_OTHER): Payer: Self-pay

## 2022-11-11 DIAGNOSIS — I2510 Atherosclerotic heart disease of native coronary artery without angina pectoris: Secondary | ICD-10-CM

## 2022-11-23 ENCOUNTER — Other Ambulatory Visit: Payer: Self-pay | Admitting: Cardiology

## 2022-11-25 NOTE — Progress Notes (Unsigned)
Cardiology Office Note:    Date:  12/09/2022   ID:  Isidor Holts, DOB Dec 21, 1950, MRN [id number]  PCP:  Eartha Inch, MD   [facility] Providers Cardiologist:  Peter Swaziland, MD     Referring MD: Eartha Inch, MD   Chief Complaint  Patient presents with   Follow-up    CAD    History of Present Illness:    Maria Arroyo is a 72 y.o. female with a hx of CAD, HTN, HLD, PAF, DM, CKD 3, lung cancer s/p lobectomy, COPD, hypothyroidism, AAA, and chronic pain.  She has a history of stenting to distal RCA in 1999, stenting to proximal RCA 12/2000.  Jan 2011 developed high grade stenosis between the two prior stents and had an additional DES placed in mid RCA. LHC 11/2011 with nonobstructive CAD. She has been on DAPT with ASA and plavix. She underwent R VATS with RLL lobectomy found to have non-small cell lung cancer stage Ia.  Postop course was complicated by atrial fibrillation requiring IV amiodarone with conversion to sinus rhythm.  She later had a recurrence of A-fib and was started on Eliquis prior to discharge.  She remains on 200 mg amiodarone, Eliquis 5 mg twice daily, and 2.5 mg bisoprolol.  Aspirin and Plavix have been held in the setting of Eliquis.  She was admitted May 2020 with acute respiratory failure and hypoxemia with hemoglobin of 6.7 requiring 2U PRBC.  She was diuresed and discharged on Lasix and HCTZ as well as home oxygen.  Echocardiogram that admission revealedLVEF 55-60%, moderate LAE, mild to moderate MR. She last saw Dr. Swaziland in clinic 05/19/2021 and was still on 3L [place] O2 at home and smoking half a pack per day.  She presents today for routine follow-up. No cardiac complaints. Walking / exercise limited by oxygen use and back pain. She is unable to walk half of a block, but can walk a store with a shopping cart. She does not have a nephrologist. She reports leg tiredness, but sounds like sciatica pain. Given disease, I think we  should rule out PAD. She continues to smoke.     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 11/07/2018   COPD (chronic obstructive pulmonary disease) (HCC)    Coronary artery disease    Depression    Gout    R foot and knee   Hyperlipidemia    Hypertension    Left foot drop    Neuropathy    OA (osteoarthritis)    Pica    Tobacco abuse     Past Surgical History:  Procedure Laterality Date   ABDOMINAL HYSTERECTOMY     ANTERIOR CERVICAL DECOMP/DISCECTOMY FUSION  02/06/2011   Procedure: ANTERIOR CERVICAL DECOMPRESSION/DISCECTOMY FUSION 2 LEVELS;  Surgeon: Carmela Hurt;  Location: [facility];  Service: Neurosurgery;  Laterality: N/A;  Anterior Cervical Four-Five/Five-Six Decompression and Fusion with Plating and Bonegraft   BLADDER SURGERY     CARDIAC CATHETERIZATION  04/09/2009   EF 60%   CARDIAC CATHETERIZATION  03/24/2001   EF 55%   CARDIAC CATHETERIZATION  01/06/2001   EF 65%   CHOLECYSTECTOMY     CORONARY STENT PLACEMENT  03/2009   STENTING OF THE PROXIMAL TO MID RIGHT CORONARY   IR THORACENTESIS ASP PLEURAL SPACE W/IMG GUIDE  06/01/2018   LEFT HEART CATHETERIZATION WITH CORONARY ANGIOGRAM N/A  12/21/2011   Procedure: LEFT HEART CATHETERIZATION WITH CORONARY ANGIOGRAM;  Surgeon: Peter M Swaziland, MD;  Location: [facility] CATH LAB;  Service: Cardiovascular;  Laterality: N/A;   LOBECTOMY     NODE DISSECTION N/A 04/04/2018   Procedure: NODE DISSECTION;  Surgeon: Loreli Slot, MD;  Location: [facility] OR;  Service: Thoracic;  Laterality: N/A;   OTHER SURGICAL HISTORY  12/2014   R foot cyst removal   VIDEO ASSISTED THORACOSCOPY (VATS)/ LOBECTOMY Right 04/04/2018   Procedure: VIDEO ASSISTED THORACOSCOPY (VATS)/RIGHT LOWER LOBECTOMY;  Surgeon: Loreli Slot, MD;  Location: [facility] OR;  Service: Thoracic;  Laterality: Right;    Current Medications: Current Meds  Medication Sig    acetaminophen (TYLENOL) 500 MG tablet Take 2 tablets (1,000 mg total) by mouth every 6 (six) hours.   allopurinol (ZYLOPRIM) 100 MG tablet Take 100 mg by mouth daily.    Ascorbic Acid (VITAMIN C PO) Take 1,000 mg by mouth daily.   busPIRone (BUSPAR) 15 MG tablet Take 15 mg by mouth 2 (two) times daily.   Calcium Carbonate-Vitamin D (CALCIUM 600 + D PO) Take 1 tablet by mouth daily.   carisoprodol (SOMA) 350 MG tablet Take 1 tablet by mouth 4 (four) times daily as needed for muscle spasms.   Cholecalciferol (VITAMIN D) 2000 UNITS tablet Take 4,000 Units by mouth daily.   diazepam (VALIUM) 5 MG tablet Take 5 mg by mouth daily.   gabapentin (NEURONTIN) 600 MG tablet TAKE 1 TABLET(600 MG) BY MOUTH FOUR TIMES DAILY AT 8 AM AND AT 1 PM AND AT 6 PM AND AT 10 PM   hydrocortisone (CORTEF) 20 MG tablet Take 20 mg by mouth daily.   ibuprofen (ADVIL) 200 MG tablet Take 200 mg by mouth every 6 (six) hours as needed for headache.   indomethacin (INDOCIN SR) 75 MG CR capsule    Multiple Vitamin (MULTIVITAMIN WITH MINERALS) TABS tablet Take 1 tablet by mouth daily.   nitroGLYCERIN (NITROSTAT) 0.4 MG SL tablet PLACE 1 TABLET UNDER THE TONGUE EVERY 5 MINUTES AS NEEDED CHEST PAIN.   oxyCODONE-acetaminophen (PERCOCET) 10-325 MG tablet Take 1 tablet by mouth 4 (four) times daily as needed for pain.   traZODone (DESYREL) 50 MG tablet    valACYclovir (VALTREX) 1000 MG tablet Take 500 mg by mouth daily.   venlafaxine XR (EFFEXOR-XR) 75 MG 24 hr capsule Take 75 mg by mouth 2 (two) times daily.    [DISCONTINUED] amiodarone (PACERONE) 200 MG tablet Take 1 tablet (200 mg total) by mouth daily.   [DISCONTINUED] apixaban (ELIQUIS) 5 MG TABS tablet Take 1 tablet (5 mg total) by mouth 2 (two) times daily.   [DISCONTINUED] hydrochlorothiazide (MICROZIDE) 12.5 MG capsule Take 12.5 mg by mouth daily.   [DISCONTINUED] rosuvastatin (CRESTOR) 40 MG tablet TAKE 1 TABLET BY MOUTH DAILY   [DISCONTINUED] torsemide (DEMADEX) 20 MG  tablet TAKE 1 TABLET BY MOUTH DAILY     Allergies:   Ace inhibitors, Codeine, and Codeine-guaifenesin [guaifenesin-codeine]   Social History   Socioeconomic History   Marital status: Married    Spouse name: Not on file   Number of children: 2   Years of education: 10   Highest education level: Not on file  Occupational History   Occupation: housewife    Employer: UNEMPLOYED  Tobacco Use   Smoking status: Every Day    Current packs/day: 2.00    Average packs/day: 2.0 packs/day for 35.0 years (70.0 ttl pk-yrs)    Types: Cigarettes   Smokeless tobacco: Never  Tobacco comments:    1-1.5 ppd (10/02/13), 03/17/18 2 PPD  Vaping Use   Vaping status: Never Used  Substance and Sexual Activity   Alcohol use: No    Alcohol/week: 0.0 standard drinks of alcohol   Drug use: No   Sexual activity: Not on file  Other Topics Concern   Not on file  Social History Narrative   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   The patient has a 10th grade education.   The patient is left-handed.   The patient has two children.   Patient drinks three cups of soda daily.       Social Determinants of Health   Financial Resource Strain: Low Risk  (05/11/2022)   Received from [facility], [facility]   Overall Financial Resource Strain (CARDIA)    Difficulty of Paying Living Expenses: Not hard at all  Food Insecurity: No Food Insecurity (05/11/2022)   Received from [facility], [facility]   Hunger Vital Sign    Worried About Running Out of Food in the Last Year: Never true    Ran Out of Food in the Last Year: Never true  Transportation Needs: No Transportation Needs (05/11/2022)   Received from [facility], [facility]   PRAPARE - Transportation    Lack of Transportation (Medical): No    Lack of Transportation (Non-Medical): No  Physical Activity: Unknown (01/28/2022)   Received from [facility], [facility]   Exercise Vital Sign    Days of Exercise per Week: 0  days    Minutes of Exercise per Session: Not on file  Stress: Stress Concern Present (01/28/2022)   Received from [facility], [facility], [facility] - Occupational Stress Questionnaire    Feeling of Stress : To some extent  Social Connections: Somewhat Isolated (01/28/2022)   Received from [facility], [facility]   Social Network    How would you rate your social network (family, work, friends)?: Restricted participation with some degree of social isolation     Family History: The patient's family history includes Aneurysm in her mother; Heart attack in her brother and father; Heart failure in her father; Stroke in her sister.  ROS:   Please see the history of present illness.     All other systems reviewed and are negative.  EKGs/Labs/Other Studies Reviewed:    The following studies were reviewed today:  Echo 07/2018: 1. The left ventricle has normal systolic function, with an ejection  fraction of 55-60%. The cavity size was mildly dilated. Left ventricular  diastolic Doppler parameters are consistent with impaired relaxation.   2. The right ventricle has moderately reduced systolic function. The  cavity was mildly enlarged. There is no increase in right ventricular wall  thickness.   3. Poor acoustic windows limit study of RV.   4. Right atrial size was moderately dilated.   5. The mitral valve is abnormal. Mild thickening of the mitral valve  leaflet. Mild calcification of the mitral valve leaflet. Mitral valve  regurgitation is mild to moderate by color flow Doppler.   6. MR is eccentric, directed posterior into LA Mild to moderate in  severity.   7. The aortic valve is abnormal. Mild thickening of the aortic valve.  Moderate calcification of the aortic valve.   8. The interatrial septum was not assessed.   EKG Interpretation Date/Time:  Wednesday December 09 2022 11:34:30 EDT Ventricular Rate:  54 PR Interval:  198 QRS  Duration:  172 QT Interval:  556 QTC Calculation: 527 R Axis:   -49  Text Interpretation: Sinus bradycardia Left axis deviation Left bundle branch block When compared with ECG of 26-Aug-2018 13:07, PREVIOUS ECG IS PRESENT Confirmed by Micah Flesher ([id number]) on 12/09/2022 12:14:26 PM    Recent Labs: No results found for requested labs within last 365 days.  Recent Lipid Panel No results found for: "CHOL", "TRIG", "HDL", "CHOLHDL", "VLDL", "LDLCALC", "LDLDIRECT"   Risk Assessment/Calculations:    CHA2DS2-VASc Score = 5   This indicates a 7.2% annual risk of stroke. The patient's score is based upon: CHF History: 1 HTN History: 1 Diabetes History: 0 Stroke History: 0 Vascular Disease History: 1 Age Score: 1 Gender Score: 1            Physical Exam:    VS:  BP 118/84 (BP Location: Left Arm, Patient Position: Sitting, Cuff Size: Normal)   Pulse (!) 54   Ht 5\' 1"  (1.549 m)   Wt 163 lb 12.8 oz (74.3 kg)   SpO2 95%   BMI 30.95 kg/m     Wt Readings from Last 3 Encounters:  12/09/22 163 lb 12.8 oz (74.3 kg)  08/28/21 164 lb 6 oz (74.6 kg)  05/19/21 171 lb (77.6 kg)     GEN:  Well nourished, well developed in no acute distress HEENT: Normal NECK: No JVD; No carotid bruits LYMPHATICS: No lymphadenopathy CARDIAC: RRR, no murmurs, rubs, gallops RESPIRATORY:  Clear to auscultation without rales, wheezing or rhonchi  ABDOMEN: Soft, non-tender, non-distended MUSCULOSKELETAL:  No edema; No deformity  SKIN: Warm and dry NEUROLOGIC:  Alert and oriented x 3 PSYCHIATRIC:  Normal affect   ASSESSMENT:    1. Coronary artery disease involving native coronary artery of native heart without angina pectoris   2. Claudication (HCC)   3. Essential hypertension   4. PAF (paroxysmal atrial fibrillation) (HCC)   5. Chronic anticoagulation   6. Chronic heart failure with preserved ejection fraction (HCC)   7. COPD GOLD II if use FEV1/VC ratio    8. Chronic respiratory failure, unspecified  whether with hypoxia or hypercapnia (HCC)   9. 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 Tobacco abuse   11. Hyperlipidemia with target LDL less than 70   12. Pain in both lower legs    PLAN:    In order of problems listed above:  CAD Extensive prior stenting to her RCA No antiplatelet therapy given need for Eliquis No chest pain - fairly sedentary but can walk a store with a shopping cart   PAF Chronic anticoagulation Continues on 200 mg amiodarone and anticoagulated with Eliquis Bisoprolol previously reduced to 2.5 mg for bradycardia   Hypertension Remains on 2.5 mg bisoprolol   Chronic diastolic heart failure Maintained on 20 mg torsemide daily   Chronic respiratory failure COPD Tobacco abuse Hx of lung cancer - bisoprolol - she declines referral to pulmonology   Hyperlipidemia with LDL goal less than 70 Remains on 40 mg Crestor Needs updated lipid panel - creatinine clearance is 42 - will collect lipid and BMP today, refill 40 mg crestor in the meantime   Lower leg pain with walking Concerning for claudication, although also sounds consistent with nerve pain - given CAD, will obtain arterial duplex   Will get labs today - check CrCl for crestor Follow up in 1 year, sooner if needed.          Medication Adjustments/Labs and Tests Ordered: Current  medicines are reviewed at length with the patient today.  Concerns regarding medicines are outlined above.  Orders Placed This Encounter  Procedures   CBC   Comprehensive metabolic panel   Lipid panel   EKG 12-Lead   VAS [place] ABI WITH/WO TBI   Meds ordered this encounter  Medications   bisoprolol (ZEBETA) 5 MG tablet    Sig: Take 0.5 tablets (2.5 mg total) by mouth daily.    Dispense:  45 tablet    Refill:  3   DISCONTD: amiodarone (PACERONE) 200 MG tablet    Sig: Take 1 tablet (200 mg total) by mouth daily.    Dispense:  90 tablet    Refill:  3   DISCONTD: apixaban (ELIQUIS) 5 MG  TABS tablet    Sig: Take 1 tablet (5 mg total) by mouth 2 (two) times daily.    Dispense:  180 tablet    Refill:  3   rosuvastatin (CRESTOR) 40 MG tablet    Sig: TAKE 1 TABLET BY MOUTH DAILY    Dispense:  30 tablet    Refill:  0    Pt must keep upcoming appt in September 2024 with Dr. Swaziland before anymore refills. Thank you Final Attempt   torsemide (DEMADEX) 20 MG tablet    Sig: Take 1 tablet (20 mg total) by mouth daily.    Dispense:  90 tablet    Refill:  3   apixaban (ELIQUIS) 5 MG TABS tablet    Sig: Take 1 tablet (5 mg total) by mouth 2 (two) times daily.    Dispense:  180 tablet    Refill:  3   amiodarone (PACERONE) 200 MG tablet    Sig: Take 1 tablet (200 mg total) by mouth daily.    Dispense:  90 tablet    Refill:  3    Patient Instructions  Medication Instructions:  No changes *If you need a refill on your cardiac medications before your next appointment, please call your pharmacy*   Lab Work: CMET, CBC, Lipid Panel : Today If you have labs (blood work) drawn today and your tests are completely normal, you will receive your results only by: MyChart Message (if you have MyChart) OR A paper copy in the mail If you have any lab test that is abnormal or we need to change your treatment, we will call you to review the results.   Testing/Procedures: [address], [address]. Your physician has requested that you have an ankle brachial index (ABI). During this test an ultrasound and blood pressure cuff are used to evaluate the arteries that supply the arms and legs with blood. Allow thirty minutes for this exam. There are no restrictions or special instructions.    Follow-Up: At [facility], you and your health needs are our priority.  As part of our continuing mission to provide you with exceptional heart care, we have created designated Provider Care Teams.  These Care Teams include your primary Cardiologist (physician) and Advanced Practice Providers  (APPs -  Physician Assistants and Nurse Practitioners) who all work together to provide you with the care you need, when you need it.  We recommend signing up for the patient portal called "MyChart".  Sign up information is provided on this After Visit Summary.  MyChart is used to connect with patients for Virtual Visits (Telemedicine).  Patients are able to view lab/test results, encounter notes, upcoming appointments, etc.  Non-urgent messages can be sent to your provider as well.  To learn more about what you can do with MyChart, go to [email or web address].    Your next appointment:   1 year(s)  Provider:   Peter Swaziland, MD     Signed, Roe Rutherford Aailyah Dunbar, [place]  12/09/2022 12:16 PM    [facility] updated [facility] panel

## 2022-12-09 ENCOUNTER — Ambulatory Visit: Payer: Medicare Other | Attending: General Practice | Admitting: Physician Assistant

## 2022-12-09 ENCOUNTER — Encounter: Payer: Self-pay | Admitting: Physician Assistant

## 2022-12-09 ENCOUNTER — Other Ambulatory Visit (HOSPITAL_BASED_OUTPATIENT_CLINIC_OR_DEPARTMENT_OTHER): Payer: Self-pay | Admitting: *Deleted

## 2022-12-09 VITALS — BP 118/84 | HR 54 | Ht 61.0 in | Wt 163.8 lb

## 2022-12-09 DIAGNOSIS — J9610 Chronic respiratory failure, unspecified whether with hypoxia or hypercapnia: Secondary | ICD-10-CM | POA: Insufficient documentation

## 2022-12-09 DIAGNOSIS — I480 Paroxysmal atrial fibrillation: Secondary | ICD-10-CM | POA: Insufficient documentation

## 2022-12-09 DIAGNOSIS — M79662 Pain in left lower leg: Secondary | ICD-10-CM | POA: Insufficient documentation

## 2022-12-09 DIAGNOSIS — I2510 Atherosclerotic heart disease of native coronary artery without angina pectoris: Secondary | ICD-10-CM

## 2022-12-09 DIAGNOSIS — C3491 Malignant neoplasm of unspecified part of right bronchus or lung: Secondary | ICD-10-CM | POA: Insufficient documentation

## 2022-12-09 DIAGNOSIS — I5032 Chronic diastolic (congestive) heart failure: Secondary | ICD-10-CM | POA: Insufficient documentation

## 2022-12-09 DIAGNOSIS — E785 Hyperlipidemia, unspecified: Secondary | ICD-10-CM | POA: Insufficient documentation

## 2022-12-09 DIAGNOSIS — M79661 Pain in right lower leg: Secondary | ICD-10-CM | POA: Diagnosis present

## 2022-12-09 DIAGNOSIS — J449 Chronic obstructive pulmonary disease, unspecified: Secondary | ICD-10-CM | POA: Insufficient documentation

## 2022-12-09 DIAGNOSIS — I739 Peripheral vascular disease, unspecified: Secondary | ICD-10-CM | POA: Diagnosis not present

## 2022-12-09 DIAGNOSIS — Z720 Tobacco use: Secondary | ICD-10-CM | POA: Diagnosis present

## 2022-12-09 DIAGNOSIS — Z7901 Long term (current) use of anticoagulants: Secondary | ICD-10-CM | POA: Insufficient documentation

## 2022-12-09 DIAGNOSIS — I10 Essential (primary) hypertension: Secondary | ICD-10-CM | POA: Diagnosis present

## 2022-12-09 MED ORDER — AMIODARONE HCL 200 MG PO TABS: 200.0000 mg | ORAL_TABLET | Freq: Every day | ORAL | 3 refills | Status: DC

## 2022-12-09 MED ORDER — APIXABAN 5 MG PO TABS: 5.0000 mg | ORAL_TABLET | Freq: Two times a day (BID) | ORAL | 3 refills | Status: DC

## 2022-12-09 MED ORDER — ROSUVASTATIN CALCIUM 40 MG PO TABS: ORAL_TABLET | ORAL | 0 refills | Status: DC

## 2022-12-09 MED ORDER — ROSUVASTATIN CALCIUM 40 MG PO TABS: ORAL_TABLET | ORAL | 3 refills | Status: DC

## 2022-12-09 MED ORDER — TORSEMIDE 20 MG PO TABS: 20.0000 mg | ORAL_TABLET | Freq: Every day | ORAL | 3 refills | Status: DC

## 2022-12-09 MED ORDER — BISOPROLOL FUMARATE 5 MG PO TABS: 2.5000 mg | ORAL_TABLET | Freq: Every day | ORAL | 3 refills | Status: DC

## 2022-12-09 NOTE — Patient Instructions (Signed)
Medication Instructions:  No changes *If you need a refill on your cardiac medications before your next appointment, please call your pharmacy*   Lab Work: CMET, CBC, Lipid Panel : Today If you have labs (blood work) drawn today and your tests are completely normal, you will receive your results only by: MyChart Message (if you have MyChart) OR A paper copy in the mail If you have any lab test that is abnormal or we need to change your treatment, we will call you to review the results.   Testing/Procedures: [address], [address]. Your physician has requested that you have an ankle brachial index (ABI). During this test an ultrasound and blood pressure cuff are used to evaluate the arteries that supply the arms and legs with blood. Allow thirty minutes for this exam. There are no restrictions or special instructions.    Follow-Up: At [facility], you and your health needs are our priority.  As part of our continuing mission to provide you with exceptional heart care, we have created designated Provider Care Teams.  These Care Teams include your primary Cardiologist (physician) and Advanced Practice Providers (APPs -  Physician Assistants and Nurse Practitioners) who all work together to provide you with the care you need, when you need it.  We recommend signing up for the patient portal called "MyChart".  Sign up information is provided on this After Visit Summary.  MyChart is used to connect with patients for Virtual Visits (Telemedicine).  Patients are able to view lab/test results, encounter notes, upcoming appointments, etc.  Non-urgent messages can be sent to your provider as well.   To learn more about what you can do with MyChart, go to [email or web address].    Your next appointment:   1 year(s)  Provider:   Peter Swaziland, MD

## 2022-12-10 ENCOUNTER — Telehealth: Payer: Self-pay

## 2022-12-10 LAB — COMPREHENSIVE METABOLIC PANEL
ALT: 21 IU/L (ref 0–32)
AST: 23 IU/L (ref 0–40)
Albumin: 3.7 g/dL — ABNORMAL LOW (ref 3.8–4.8)
Alkaline Phosphatase: 113 IU/L (ref 44–121)
BUN/Creatinine Ratio: 21 (ref 12–28)
BUN: 37 mg/dL — ABNORMAL HIGH (ref 8–27)
Bilirubin Total: <0.2 mg/dL (ref 0.0–1.2)
CO2: 32 mmol/L — ABNORMAL HIGH (ref 20–29)
Calcium: 9.1 mg/dL (ref 8.7–10.3)
Chloride: 91 mmol/L — ABNORMAL LOW (ref 96–106)
Creatinine, Ser: 1.79 mg/dL — ABNORMAL HIGH (ref 0.57–1.00)
Globulin, Total: 2.4 g/dL (ref 1.5–4.5)
Glucose: 135 mg/dL — ABNORMAL HIGH (ref 70–99)
Potassium: 4.5 mmol/L (ref 3.5–5.2)
Sodium: 137 mmol/L (ref 134–144)
Total Protein: 6.1 g/dL (ref 6.0–8.5)
eGFR: 30 mL/min/{1.73_m2} — ABNORMAL LOW (ref >59)

## 2022-12-10 LAB — LIPID PANEL
Chol/HDL Ratio: 2.9 ratio (ref 0.0–4.4)
Cholesterol, Total: 126 mg/dL (ref 100–199)
HDL: 44 mg/dL (ref >39)
LDL Chol Calc (NIH): 60 mg/dL (ref 0–99)
Triglycerides: 120 mg/dL (ref 0–149)
VLDL Cholesterol Cal: 22 mg/dL (ref 5–40)

## 2022-12-10 LAB — CBC
Hematocrit: 34.3 % (ref 34.0–46.6)
Hemoglobin: 10.9 g/dL — ABNORMAL LOW (ref 11.1–15.9)
MCH: 30.8 pg (ref 26.6–33.0)
MCHC: 31.8 g/dL (ref 31.5–35.7)
MCV: 97 fL (ref 79–97)
Platelets: 246 10*3/uL (ref 150–450)
RBC: 3.54 x10E6/uL — ABNORMAL LOW (ref 3.77–5.28)
RDW: 13.1 % (ref 11.7–15.4)
WBC: 7.1 10*3/uL (ref 3.4–10.8)

## 2022-12-10 NOTE — Telephone Encounter (Signed)
Lmom to discuss lab results. Waiting on a return call.  

## 2022-12-14 NOTE — Telephone Encounter (Signed)
Lmom to discuss lab results. Waiting on a return call.

## 2022-12-15 ENCOUNTER — Telehealth: Payer: Self-pay | Admitting: Cardiology

## 2022-12-15 NOTE — Telephone Encounter (Signed)
Returns pt's call. Went over lab results. She verbalized understanding. No further questions at this time.

## 2022-12-15 NOTE — Telephone Encounter (Signed)
Patient is returning phone call in regards to lab results. Please advise.

## 2022-12-17 ENCOUNTER — Ambulatory Visit (HOSPITAL_COMMUNITY)
Admission: RE | Admit: 2022-12-17 | Payer: Medicare Other | Source: Ambulatory Visit | Attending: Physician Assistant | Admitting: Physician Assistant

## 2022-12-17 ENCOUNTER — Encounter (HOSPITAL_COMMUNITY): Payer: Self-pay

## 2023-05-12 ENCOUNTER — Encounter (HOSPITAL_COMMUNITY): Payer: Self-pay | Admitting: Emergency Medicine

## 2023-05-12 ENCOUNTER — Inpatient Hospital Stay (HOSPITAL_COMMUNITY)
Admission: EM | Admit: 2023-05-12 | Discharge: 2023-05-16 | DRG: 177 | Disposition: A | Discharge status: Home Health | Payer: Medicare Other | Source: Ambulatory Visit | Attending: Internal Medicine | Admitting: Internal Medicine

## 2023-05-12 ENCOUNTER — Emergency Department (HOSPITAL_COMMUNITY): Payer: Medicare Other

## 2023-05-12 ENCOUNTER — Other Ambulatory Visit: Payer: Self-pay

## 2023-05-12 DIAGNOSIS — Z8249 Family history of ischemic heart disease and other diseases of the circulatory system: Secondary | ICD-10-CM

## 2023-05-12 DIAGNOSIS — Z823 Family history of stroke: Secondary | ICD-10-CM

## 2023-05-12 DIAGNOSIS — Z902 Acquired absence of lung [part of]: Secondary | ICD-10-CM

## 2023-05-12 DIAGNOSIS — E119 Type 2 diabetes mellitus without complications: Secondary | ICD-10-CM

## 2023-05-12 DIAGNOSIS — I130 Hypertensive heart and chronic kidney disease with heart failure and stage 1 through stage 4 chronic kidney disease, or unspecified chronic kidney disease: Secondary | ICD-10-CM | POA: Diagnosis present

## 2023-05-12 DIAGNOSIS — Z5941 Food insecurity: Secondary | ICD-10-CM

## 2023-05-12 DIAGNOSIS — Z888 Allergy status to other drugs, medicaments and biological substances status: Secondary | ICD-10-CM

## 2023-05-12 DIAGNOSIS — N1832 Chronic kidney disease, stage 3b: Secondary | ICD-10-CM | POA: Diagnosis present

## 2023-05-12 DIAGNOSIS — Z981 Arthrodesis status: Secondary | ICD-10-CM

## 2023-05-12 DIAGNOSIS — E1165 Type 2 diabetes mellitus with hyperglycemia: Secondary | ICD-10-CM | POA: Diagnosis present

## 2023-05-12 DIAGNOSIS — E785 Hyperlipidemia, unspecified: Secondary | ICD-10-CM | POA: Diagnosis present

## 2023-05-12 DIAGNOSIS — Z7901 Long term (current) use of anticoagulants: Secondary | ICD-10-CM

## 2023-05-12 DIAGNOSIS — Z79624 Long term (current) use of inhibitors of nucleotide synthesis: Secondary | ICD-10-CM

## 2023-05-12 DIAGNOSIS — I5032 Chronic diastolic (congestive) heart failure: Secondary | ICD-10-CM | POA: Diagnosis present

## 2023-05-12 DIAGNOSIS — J9622 Acute and chronic respiratory failure with hypercapnia: Secondary | ICD-10-CM | POA: Diagnosis present

## 2023-05-12 DIAGNOSIS — E871 Hypo-osmolality and hyponatremia: Secondary | ICD-10-CM | POA: Diagnosis present

## 2023-05-12 DIAGNOSIS — I2510 Atherosclerotic heart disease of native coronary artery without angina pectoris: Secondary | ICD-10-CM | POA: Diagnosis present

## 2023-05-12 DIAGNOSIS — U071 COVID-19: Secondary | ICD-10-CM | POA: Diagnosis not present

## 2023-05-12 DIAGNOSIS — Z90710 Acquired absence of both cervix and uterus: Secondary | ICD-10-CM

## 2023-05-12 DIAGNOSIS — F17210 Nicotine dependence, cigarettes, uncomplicated: Secondary | ICD-10-CM | POA: Diagnosis present

## 2023-05-12 DIAGNOSIS — I4891 Unspecified atrial fibrillation: Secondary | ICD-10-CM | POA: Diagnosis present

## 2023-05-12 DIAGNOSIS — F32A Depression, unspecified: Secondary | ICD-10-CM | POA: Diagnosis present

## 2023-05-12 DIAGNOSIS — J441 Chronic obstructive pulmonary disease with (acute) exacerbation: Secondary | ICD-10-CM | POA: Diagnosis present

## 2023-05-12 DIAGNOSIS — Z6830 Body mass index (BMI) 30.0-30.9, adult: Secondary | ICD-10-CM

## 2023-05-12 DIAGNOSIS — M109 Gout, unspecified: Secondary | ICD-10-CM | POA: Diagnosis present

## 2023-05-12 DIAGNOSIS — I447 Left bundle-branch block, unspecified: Secondary | ICD-10-CM | POA: Diagnosis present

## 2023-05-12 DIAGNOSIS — Z79899 Other long term (current) drug therapy: Secondary | ICD-10-CM

## 2023-05-12 DIAGNOSIS — Z955 Presence of coronary angioplasty implant and graft: Secondary | ICD-10-CM

## 2023-05-12 DIAGNOSIS — E873 Alkalosis: Secondary | ICD-10-CM | POA: Diagnosis present

## 2023-05-12 DIAGNOSIS — J449 Chronic obstructive pulmonary disease, unspecified: Secondary | ICD-10-CM | POA: Diagnosis present

## 2023-05-12 DIAGNOSIS — Z7951 Long term (current) use of inhaled steroids: Secondary | ICD-10-CM

## 2023-05-12 DIAGNOSIS — Z9889 Other specified postprocedural states: Secondary | ICD-10-CM

## 2023-05-12 DIAGNOSIS — C3491 Malignant neoplasm of unspecified part of right bronchus or lung: Secondary | ICD-10-CM | POA: Diagnosis present

## 2023-05-12 DIAGNOSIS — Z9981 Dependence on supplemental oxygen: Secondary | ICD-10-CM

## 2023-05-12 DIAGNOSIS — N390 Urinary tract infection, site not specified: Secondary | ICD-10-CM | POA: Clinically undetermined

## 2023-05-12 DIAGNOSIS — E876 Hypokalemia: Secondary | ICD-10-CM | POA: Insufficient documentation

## 2023-05-12 DIAGNOSIS — Z9049 Acquired absence of other specified parts of digestive tract: Secondary | ICD-10-CM

## 2023-05-12 DIAGNOSIS — I480 Paroxysmal atrial fibrillation: Secondary | ICD-10-CM | POA: Diagnosis present

## 2023-05-12 DIAGNOSIS — D649 Anemia, unspecified: Secondary | ICD-10-CM | POA: Diagnosis present

## 2023-05-12 DIAGNOSIS — E1141 Type 2 diabetes mellitus with diabetic mononeuropathy: Secondary | ICD-10-CM | POA: Diagnosis present

## 2023-05-12 DIAGNOSIS — N179 Acute kidney failure, unspecified: Secondary | ICD-10-CM | POA: Insufficient documentation

## 2023-05-12 DIAGNOSIS — Z885 Allergy status to narcotic agent status: Secondary | ICD-10-CM

## 2023-05-12 DIAGNOSIS — E1122 Type 2 diabetes mellitus with diabetic chronic kidney disease: Secondary | ICD-10-CM | POA: Diagnosis present

## 2023-05-12 DIAGNOSIS — F419 Anxiety disorder, unspecified: Secondary | ICD-10-CM | POA: Diagnosis present

## 2023-05-12 DIAGNOSIS — Z85118 Personal history of other malignant neoplasm of bronchus and lung: Secondary | ICD-10-CM

## 2023-05-12 DIAGNOSIS — Z7952 Long term (current) use of systemic steroids: Secondary | ICD-10-CM

## 2023-05-12 DIAGNOSIS — J9621 Acute and chronic respiratory failure with hypoxia: Secondary | ICD-10-CM

## 2023-05-12 DIAGNOSIS — E669 Obesity, unspecified: Secondary | ICD-10-CM | POA: Diagnosis present

## 2023-05-12 LAB — CBC
HCT: 33.1 % — ABNORMAL LOW (ref 36.0–46.0)
Hemoglobin: 10.5 g/dL — ABNORMAL LOW (ref 12.0–15.0)
MCH: 28.1 pg (ref 26.0–34.0)
MCHC: 31.7 g/dL (ref 30.0–36.0)
MCV: 88.5 fL (ref 80.0–100.0)
Platelets: 421 10*3/uL — ABNORMAL HIGH (ref 150–400)
RBC: 3.74 MIL/uL — ABNORMAL LOW (ref 3.87–5.11)
RDW: 15.3 % (ref 11.5–15.5)
WBC: 15.7 10*3/uL — ABNORMAL HIGH (ref 4.0–10.5)
nRBC: 0 % (ref 0.0–0.2)

## 2023-05-12 LAB — I-STAT VENOUS BLOOD GAS, ED
Acid-Base Excess: 14 mmol/L — ABNORMAL HIGH (ref 0.0–2.0)
Bicarbonate: 40.2 mmol/L — ABNORMAL HIGH (ref 20.0–28.0)
Calcium, Ion: 1.1 mmol/L — ABNORMAL LOW (ref 1.15–1.40)
HCT: 33 % — ABNORMAL LOW (ref 36.0–46.0)
Hemoglobin: 11.2 g/dL — ABNORMAL LOW (ref 12.0–15.0)
O2 Saturation: 70 %
Potassium: 3.8 mmol/L (ref 3.5–5.1)
Sodium: 130 mmol/L — ABNORMAL LOW (ref 135–145)
TCO2: 42 mmol/L — ABNORMAL HIGH (ref 22–32)
pCO2, Ven: 61.3 mm[Hg] — ABNORMAL HIGH (ref 44–60)
pH, Ven: 7.425 (ref 7.25–7.43)
pO2, Ven: 37 mm[Hg] (ref 32–45)

## 2023-05-12 LAB — BASIC METABOLIC PANEL
Anion gap: 19 — ABNORMAL HIGH (ref 5–15)
BUN: 39 mg/dL — ABNORMAL HIGH (ref 8–23)
CO2: 35 mmol/L — ABNORMAL HIGH (ref 22–32)
Calcium: 9.4 mg/dL (ref 8.9–10.3)
Chloride: 79 mmol/L — ABNORMAL LOW (ref 98–111)
Creatinine, Ser: 1.69 mg/dL — ABNORMAL HIGH (ref 0.44–1.00)
GFR, Estimated: 32 mL/min — ABNORMAL LOW (ref >60)
Glucose, Bld: 186 mg/dL — ABNORMAL HIGH (ref 70–99)
Potassium: 2.7 mmol/L — CL (ref 3.5–5.1)
Sodium: 133 mmol/L — ABNORMAL LOW (ref 135–145)

## 2023-05-12 LAB — MAGNESIUM: Magnesium: 2.2 mg/dL (ref 1.7–2.4)

## 2023-05-12 LAB — RESP PANEL BY RT-PCR (RSV, FLU A&B, COVID)  RVPGX2
Influenza A by PCR: NEGATIVE
Influenza B by PCR: NEGATIVE
Resp Syncytial Virus by PCR: NEGATIVE
SARS Coronavirus 2 by RT PCR: POSITIVE — AB

## 2023-05-12 LAB — CBG MONITORING, ED: Glucose-Capillary: 203 mg/dL — ABNORMAL HIGH (ref 70–99)

## 2023-05-12 MED ORDER — SODIUM CHLORIDE 0.9 % IV SOLN: INTRAVENOUS | Status: DC

## 2023-05-12 MED ORDER — ACETAMINOPHEN 325 MG PO TABS
650.0000 mg | ORAL_TABLET | Freq: Four times a day (QID) | ORAL | Status: DC | PRN
  Administered 2023-05-13: 650 mg via ORAL
  Filled 2023-05-12: qty 2

## 2023-05-12 MED ORDER — GABAPENTIN 600 MG PO TABS: 600.0000 mg | ORAL_TABLET | Freq: Three times a day (TID) | ORAL | Status: DC

## 2023-05-12 MED ORDER — PREDNISONE 20 MG PO TABS
40.0000 mg | ORAL_TABLET | Freq: Every day | ORAL | Status: AC
  Administered 2023-05-13 – 2023-05-16 (×4): 40 mg via ORAL
  Filled 2023-05-12 (×4): qty 2

## 2023-05-12 MED ORDER — UMECLIDINIUM BROMIDE 62.5 MCG/ACT IN AEPB
1.0000 | INHALATION_SPRAY | Freq: Every day | RESPIRATORY_TRACT | Status: DC
  Administered 2023-05-13: 1 via RESPIRATORY_TRACT
  Filled 2023-05-12 (×2): qty 7

## 2023-05-12 MED ORDER — TRAZODONE HCL 50 MG PO TABS: 50.0000 mg | ORAL_TABLET | Freq: Every evening | ORAL | Status: DC | PRN

## 2023-05-12 MED ORDER — VENLAFAXINE HCL ER 75 MG PO CP24
150.0000 mg | ORAL_CAPSULE | Freq: Every day | ORAL | Status: DC
  Administered 2023-05-12 – 2023-05-16 (×5): 150 mg via ORAL
  Filled 2023-05-12 (×5): qty 2

## 2023-05-12 MED ORDER — AMIODARONE HCL 200 MG PO TABS
200.0000 mg | ORAL_TABLET | Freq: Every day | ORAL | Status: DC
  Administered 2023-05-12 – 2023-05-16 (×5): 200 mg via ORAL
  Filled 2023-05-12 (×5): qty 1

## 2023-05-12 MED ORDER — SODIUM CHLORIDE 0.9 % IV SOLN
500.0000 mg | Freq: Once | INTRAVENOUS | Status: AC
  Administered 2023-05-12: 500 mg via INTRAVENOUS
  Filled 2023-05-12: qty 5

## 2023-05-12 MED ORDER — HYDROCHLOROTHIAZIDE 12.5 MG PO TABS
12.5000 mg | ORAL_TABLET | Freq: Every day | ORAL | Status: DC
  Administered 2023-05-12 – 2023-05-13 (×2): 12.5 mg via ORAL
  Filled 2023-05-12 (×2): qty 1

## 2023-05-12 MED ORDER — BISOPROLOL FUMARATE 5 MG PO TABS
2.5000 mg | ORAL_TABLET | Freq: Every day | ORAL | Status: DC
  Administered 2023-05-12 – 2023-05-16 (×5): 2.5 mg via ORAL
  Filled 2023-05-12 (×5): qty 0.5

## 2023-05-12 MED ORDER — POTASSIUM CHLORIDE 20 MEQ PO PACK
60.0000 meq | PACK | Freq: Once | ORAL | Status: AC
Start: 2023-05-12 — End: 2023-05-12
  Administered 2023-05-12: 60 meq via ORAL
  Filled 2023-05-12: qty 3

## 2023-05-12 MED ORDER — MOMETASONE FURO-FORMOTEROL FUM 200-5 MCG/ACT IN AERO
2.0000 | INHALATION_SPRAY | Freq: Two times a day (BID) | RESPIRATORY_TRACT | Status: DC
  Administered 2023-05-13: 2 via RESPIRATORY_TRACT
  Filled 2023-05-12: qty 8.8

## 2023-05-12 MED ORDER — ALBUTEROL SULFATE (2.5 MG/3ML) 0.083% IN NEBU: 2.5000 mg | INHALATION_SOLUTION | RESPIRATORY_TRACT | Status: DC | PRN

## 2023-05-12 MED ORDER — SODIUM CHLORIDE 0.9 % IV SOLN
500.0000 mg | INTRAVENOUS | Status: DC
  Administered 2023-05-13: 500 mg via INTRAVENOUS
  Filled 2023-05-12: qty 5

## 2023-05-12 MED ORDER — TORSEMIDE 20 MG PO TABS
20.0000 mg | ORAL_TABLET | Freq: Every day | ORAL | Status: DC
  Administered 2023-05-12 – 2023-05-13 (×2): 20 mg via ORAL
  Filled 2023-05-12 (×2): qty 1

## 2023-05-12 MED ORDER — APIXABAN 5 MG PO TABS
5.0000 mg | ORAL_TABLET | Freq: Two times a day (BID) | ORAL | Status: DC
  Administered 2023-05-12 – 2023-05-16 (×8): 5 mg via ORAL
  Filled 2023-05-12 (×7): qty 1

## 2023-05-12 MED ORDER — POTASSIUM CHLORIDE 10 MEQ/100ML IV SOLN
10.0000 meq | Freq: Once | INTRAVENOUS | Status: AC
  Administered 2023-05-12: 10 meq via INTRAVENOUS
  Filled 2023-05-12: qty 100

## 2023-05-12 MED ORDER — BUSPIRONE HCL 5 MG PO TABS
15.0000 mg | ORAL_TABLET | Freq: Two times a day (BID) | ORAL | Status: DC
  Administered 2023-05-12 – 2023-05-16 (×8): 15 mg via ORAL
  Filled 2023-05-12 (×5): qty 3
  Filled 2023-05-12: qty 2
  Filled 2023-05-12: qty 3
  Filled 2023-05-12: qty 2

## 2023-05-12 MED ORDER — APIXABAN 5 MG PO TABS
5.0000 mg | ORAL_TABLET | Freq: Two times a day (BID) | ORAL | Status: DC
  Filled 2023-05-12: qty 1

## 2023-05-12 MED ORDER — ACETAMINOPHEN 650 MG RE SUPP: 650.0000 mg | Freq: Four times a day (QID) | RECTAL | Status: DC | PRN

## 2023-05-12 MED ORDER — GABAPENTIN 300 MG PO CAPS
300.0000 mg | ORAL_CAPSULE | Freq: Three times a day (TID) | ORAL | Status: DC
Start: 2023-05-12 — End: 2023-05-14
  Administered 2023-05-12 – 2023-05-14 (×6): 300 mg via ORAL
  Filled 2023-05-12: qty 3
  Filled 2023-05-12 (×7): qty 1

## 2023-05-12 MED ORDER — IPRATROPIUM-ALBUTEROL 0.5-2.5 (3) MG/3ML IN SOLN
3.0000 mL | Freq: Once | RESPIRATORY_TRACT | Status: AC
  Administered 2023-05-12: 3 mL via RESPIRATORY_TRACT
  Filled 2023-05-12: qty 3

## 2023-05-12 MED ORDER — ROSUVASTATIN CALCIUM 20 MG PO TABS
40.0000 mg | ORAL_TABLET | Freq: Every day | ORAL | Status: DC
  Administered 2023-05-13 – 2023-05-16 (×4): 40 mg via ORAL
  Filled 2023-05-12 (×4): qty 2

## 2023-05-12 MED ORDER — ONDANSETRON HCL 4 MG/2ML IJ SOLN
4.0000 mg | Freq: Once | INTRAMUSCULAR | Status: AC
  Administered 2023-05-12: 4 mg via INTRAVENOUS
  Filled 2023-05-12: qty 2

## 2023-05-12 NOTE — ED Triage Notes (Signed)
Pt BIB [facility] from doctor's office due to feeling sick for the past two weeks.  Pt reports shortness of breath has gotten worse over the past two days.  18g left AC.  EMS gave patient 1duoneb and 125 solumedrol.  Pt normally on 3 L nasal cannula baseline.

## 2023-05-12 NOTE — ED Provider Notes (Signed)
[place] EMERGENCY DEPARTMENT AT [facility] Provider Note   CSN: [id number] Arrival date & time: 05/12/23  1126     History  Chief Complaint  Patient presents with   Shortness of Breath    Maria Arroyo is a 73 y.o. female with history of CAD, COPD on 3 L chronic oxygen, hypertension, hyperlipidemia, tobacco use, depression, neuropathy, lung cancer s/p right lobectomy in 2020, paroxysmal atrial fibrillation, diastolic CHF, who presents the emergency department with complaint of shortness of breath.  Patient coming from her PCP office.  Reported she was there because she had increased her home oxygen to 4 L from 3.  She has not eaten in 2 days, hardly drinking anything.  States she is only eating ice and she feels very hot.  Is having difficulty hearing on a conversation is confused.  PCP and insistence of her husband.  He had noticed that she had an increasing cough, fatigue, malaise, and decreased appetite for the past week or so.  EMS gave 1 DuoNeb and 125 mg Solu-Medrol and route.  Husband reports some general confusion such as calling out his name but then not saying anything when he comes to her side. She's been repeatedly saying she can't breathe at home. He thinks she's not been feeling well for closer to 2 weeks, but only noticed the worsening breathing and confusion the past few days.    Shortness of Breath Associated symptoms: cough        Home Medications Prior to Admission medications   Medication Sig Start Date End Date Taking? Authorizing Provider  acetaminophen (TYLENOL) 500 MG tablet Take 2 tablets (1,000 mg total) by mouth every 6 (six) hours. 04/09/18   Sharlene Dory, PA-C  allopurinol (ZYLOPRIM) 100 MG tablet Take 100 mg by mouth daily.     [provider]  amiodarone (PACERONE) 200 MG tablet Take 1 tablet (200 mg total) by mouth daily. 12/09/22   Duke, Roe Rutherford, PA  apixaban (ELIQUIS) 5 MG TABS tablet Take 1 tablet (5 mg  total) by mouth 2 (two) times daily. 12/09/22   Duke, Roe Rutherford, PA  Ascorbic Acid (VITAMIN C PO) Take 1,000 mg by mouth daily.    [provider]  bisoprolol (ZEBETA) 5 MG tablet Take 0.5 tablets (2.5 mg total) by mouth daily. 12/09/22   Duke, Roe Rutherford, PA  busPIRone (BUSPAR) 15 MG tablet Take 15 mg by mouth 2 (two) times daily.    [provider]  Calcium Carbonate-Vitamin D (CALCIUM 600 + D PO) Take 1 tablet by mouth daily.    [provider]  carisoprodol (SOMA) 350 MG tablet Take 1 tablet by mouth 4 (four) times daily as needed for muscle spasms. 04/15/18   [provider]  Cholecalciferol (VITAMIN D) 2000 UNITS tablet Take 4,000 Units by mouth daily.    [provider]  diazepam (VALIUM) 5 MG tablet Take 5 mg by mouth daily.    [provider]  doxycycline (VIBRAMYCIN) 100 MG capsule Take 100 mg by mouth daily. Patient not taking: Reported on 12/09/2022    [provider]  gabapentin (NEURONTIN) 600 MG tablet TAKE 1 TABLET(600 MG) BY MOUTH FOUR TIMES DAILY AT 8 AM AND AT 1 PM AND AT 6 PM AND AT 10 PM 07/22/20   York Spaniel, MD  hydrocortisone (CORTEF) 20 MG tablet Take 20 mg by mouth daily.    [provider]  ibuprofen (ADVIL) 200 MG tablet Take 200 mg by  mouth every 6 (six) hours as needed for headache.    [provider]  indomethacin (INDOCIN SR) 75 MG CR capsule  04/05/19   [provider]  ketorolac (ACULAR) 0.4 % SOLN Place 1 drop into the left eye 4 (four) times daily. Patient not taking: Reported on 12/09/2022 08/22/21   [provider]  Multiple Vitamin (MULTIVITAMIN WITH MINERALS) TABS tablet Take 1 tablet by mouth daily. 08/30/18   Roberto Scales D, MD  nicotine (NICODERM CQ - DOSED IN MG/24 HOURS) 21 mg/24hr patch Place 1 patch (21 mg total) onto the skin daily. Patient not taking: Reported on 12/09/2022 08/30/18   Roberto Scales D, MD  nitroGLYCERIN (NITROSTAT) 0.4 MG SL tablet  PLACE 1 TABLET UNDER THE TONGUE EVERY 5 MINUTES AS NEEDED CHEST PAIN. 05/25/22   [place], Peter M, MD  oxyCODONE-acetaminophen (PERCOCET) 10-325 MG tablet Take 1 tablet by mouth 4 (four) times daily as needed for pain. 04/13/18   [provider]  oxyCODONE-acetaminophen (PERCOCET) 10-325 MG tablet Take 1 tablet by mouth every 6 hours as needed for pain. Max daily amount: 4 tablets Patient not taking: Reported on 12/09/2022 07/20/22     Prednisol Ace-Moxiflox-Bromfen 1-0.5-0.075 % SUSP Place 1 drop into the right eye 4 (four) times daily. Patient not taking: Reported on 12/09/2022 06/23/21   [provider]  prednisoLONE acetate (PRED FORTE) 1 % ophthalmic suspension Place 1 drop into the left eye 4 (four) times daily. Patient not taking: Reported on 12/09/2022 07/04/21   [provider]  rosuvastatin (CRESTOR) 40 MG tablet TAKE 1 TABLET BY MOUTH DAILY 12/09/22   Duke, Roe Rutherford, PA  torsemide (DEMADEX) 20 MG tablet Take 1 tablet (20 mg total) by mouth daily. 12/09/22   Marcelino Duster, PA  traZODone (DESYREL) 50 MG tablet  01/25/19   [provider]  valACYclovir (VALTREX) 1000 MG tablet Take 500 mg by mouth daily.    [provider]  venlafaxine XR (EFFEXOR-XR) 75 MG 24 hr capsule Take 75 mg by mouth 2 (two) times daily.     [provider]      Allergies    Ace inhibitors, Codeine, and Codeine-guaifenesin [guaifenesin-codeine]    Review of Systems   Review of Systems  Constitutional:  Positive for chills and fatigue.  Respiratory:  Positive for cough and shortness of breath.     Physical Exam Updated Vital Signs BP (!) 151/134   Pulse 80   Temp 98.1 F (36.7 C) (Oral)   Resp 19   Ht 5\' 1"  (1.549 m)   Wt 74 kg   SpO2 99%   BMI 30.83 kg/m  Physical Exam Vitals and nursing note reviewed.  Constitutional:      Appearance: Normal appearance.  HENT:     Head: Normocephalic and atraumatic.  Eyes:     Conjunctiva/sclera:  Conjunctivae normal.  Cardiovascular:     Rate and Rhythm: Normal rate and regular rhythm.  Pulmonary:     Effort: Pulmonary effort is normal. No respiratory distress.     Breath sounds: Decreased breath sounds present.  Abdominal:     General: There is no distension.     Palpations: Abdomen is soft.     Tenderness: There is no abdominal tenderness.  Skin:    General: Skin is warm and dry.  Neurological:     General: No focal deficit present.     Mental Status: She is alert.     ED Results / Procedures / Treatments  Labs (all labs ordered are listed, but only abnormal results are displayed) Labs Reviewed  RESP PANEL BY RT-PCR (RSV, FLU A&B, COVID)  RVPGX2 - Abnormal; Notable for the following components:      Result Value   SARS Coronavirus 2 by RT PCR POSITIVE (*)    All other components within normal limits  CBC - Abnormal; Notable for the following components:   WBC 15.7 (*)    RBC 3.74 (*)    Hemoglobin 10.5 (*)    HCT 33.1 (*)    Platelets 421 (*)    All other components within normal limits  BASIC METABOLIC PANEL - Abnormal; Notable for the following components:   Sodium 133 (*)    Potassium 2.7 (*)    Chloride 79 (*)    CO2 35 (*)    Glucose, Bld 186 (*)    BUN 39 (*)    Creatinine, Ser 1.69 (*)    GFR, Estimated 32 (*)    Anion gap 19 (*)    All other components within normal limits  I-STAT VENOUS BLOOD GAS, ED - Abnormal; Notable for the following components:   pCO2, Ven 61.3 (*)    Bicarbonate 40.2 (*)    TCO2 42 (*)    Acid-Base Excess 14.0 (*)    Sodium 130 (*)    Calcium, Ion 1.10 (*)    HCT 33.0 (*)    Hemoglobin 11.2 (*)    All other components within normal limits  MAGNESIUM    EKG None  Radiology No results found.  Procedures Procedures    Medications Ordered in ED Medications  0.9 %  sodium chloride infusion ( Intravenous New Bag/Given 05/12/23 1420)  azithromycin (ZITHROMAX) 500 mg in sodium chloride 0.9 % 250 mL IVPB (has no  administration in time range)  ipratropium-albuterol (DUONEB) 0.5-2.5 (3) MG/3ML nebulizer solution 3 mL (3 mLs Nebulization Given 05/12/23 1147)  potassium chloride (KLOR-CON) packet 60 mEq (60 mEq Oral Given 05/12/23 1252)  ondansetron (ZOFRAN) injection 4 mg (4 mg Intravenous Given 05/12/23 1252)  potassium chloride 10 mEq in 100 mL IVPB (0 mEq Intravenous Stopped 05/12/23 1402)  ipratropium-albuterol (DUONEB) 0.5-2.5 (3) MG/3ML nebulizer solution 3 mL (3 mLs Nebulization Given 05/12/23 1341)    ED Course/ Medical Decision Making/ A&P                                 Medical Decision Making Amount and/or Complexity of Data Reviewed Labs: ordered. Radiology: ordered.  Risk Prescription drug management.  This patient is a 73 y.o. female  who presents to the ED for concern of shortness of breath.   Differential diagnoses prior to evaluation: The emergent differential diagnosis includes, but is not limited to,  CHF, pericardial effusion/tamponade, arrhythmias, ACS, COPD, asthma, bronchitis, pneumonia, pneumothorax, PE, anemia. This is not an exhaustive differential.   Past Medical History / Co-morbidities / Social History: CAD, COPD on 3 L chronic oxygen, hypertension, hyperlipidemia, tobacco use, depression, neuropathy, lung cancer s/p right lobectomy in 2020, paroxysmal atrial fibrillation, diastolic CHF Most recent echo from May 2020 with LVEF 55-60%  Additional history: Chart reviewed. Pertinent results include: Reviewed PCP visit note from this AM  Physical Exam: Physical exam performed. The pertinent findings include: Hypertensive, otherwise normal vital signs. Maintaining normal oxygen saturations on 4L [place], increased from home 3 L. Lung sounds distant, diminished in the bases.   Lab Tests/Imaging studies: I personally interpreted labs/imaging and  the pertinent results include:  WBC 15.7, hemoglobin stable at 10.5. Potassium 2.7, chloride 79, CO2 35, kidney function stable, anion  gap 19. I-stat VBG pCO2 61.3, bicarbonate 40.2.  CXR formal read pending at time of admission. Per my review and interpretation, no obvious consolidation, effusion or pneumothorax.  Cardiac monitoring: EKG obtained and interpreted by myself and attending physician which shows: sinus rhythm, LBBB   Medications: I ordered medication including breathing treatments, potassium replacement, IVF, azithromycin.  I have reviewed the patients home medicines and have made adjustments as needed.  Consultations obtained: I consulted with hospitalist Dr Chana Bode who recommended: admission, azithromycin in setting of COPD exacerbation   Disposition: After consideration of the diagnostic results and the patients response to treatment, I feel that patient would benefit from medical admission for COPD exacerbation, likely in the setting of COVID-19 infection.   Final Clinical Impression(s) / ED Diagnoses Final diagnoses:  COVID-19  COPD exacerbation (HCC)    Rx / DC Orders ED Discharge Orders     None      Portions of this report may have been transcribed using voice recognition software. Every effort was made to ensure accuracy; however, inadvertent computerized transcription errors may be present.    Jeanella Flattery 05/12/23 1432    Gerhard Munch, MD 05/12/23 [phone]

## 2023-05-12 NOTE — ED Notes (Signed)
Patient transported to X-ray

## 2023-05-12 NOTE — H&P (Signed)
History and Physical    Patient: Maria Arroyo VWU:[id number] DOB: 1950-05-30 DOA: 05/12/2023 DOS: the patient was seen and examined on 05/12/2023 PCP: Eartha Inch, MD  Patient coming from: Home  Chief Complaint:  Chief Complaint  Patient presents with   Shortness of Breath   HPI: Maria Arroyo is a 73 y.o. 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  Patient reports that she has been feeling unwell for the past week or so.  She has had decreased oral intake over the past couple of days as well.  She notes that she has been feeling very warm and wants to eat ice to cool down.  Per husband, patient has recently had a worsening cough with associated fatigue, malaise, and decreased appetite.  She has also been having increase of work of breathing over the past several days.  Husband does report having increased her home oxygen to 4 L from 3L due to patient's complaints of not being able to breathe (did not check oxygen level).  Husband took patient to her PCP today and patient was subsequently advised to come to the ED for further evaluation.  EMS arrived and brought patient to the ED, did provide a dose of DuoNebs and a dose of Solu-Medrol en route.  ED course: Vital signs stable aside from elevated BP.  Saturating 99% on 3 L nasal cannula.  CBC with leukocytosis to 15.7, chronic anemia at her baseline.  BMP with significant hypokalemia, metabolic alkalosis, mild hyponatremia, hyperglycemia to 186, creatinine 1.69 (baseline appears to be around 1.6-1.8).  Respiratory panel positive for COVID. EKG with NSR and LBBB (noted on prior EKG as well).  VBG showing mild  hypercarbia, otherwise normal pH.  Chest x-ray showing trace bilateral pleural effusions otherwise no findings concerning for consolidation.  She was given DuoNebs x 2, potassium supplementation, and a dose of Zofran in the ED.  [facility] hospitalist asked to evaluate patient for admission.  Review of Systems: As mentioned in the history of present illness. All other systems reviewed and are negative. 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 11/07/2018   COPD (chronic obstructive pulmonary disease) (HCC)    Coronary artery disease    Depression    Gout    R foot and knee   Hyperlipidemia    Hypertension    Left foot drop    Neuropathy    OA (osteoarthritis)    Pica    Tobacco abuse    Past Surgical History:  Procedure Laterality Date   ABDOMINAL HYSTERECTOMY     ANTERIOR CERVICAL DECOMP/DISCECTOMY FUSION  02/06/2011   Procedure: ANTERIOR CERVICAL DECOMPRESSION/DISCECTOMY FUSION 2 LEVELS;  Surgeon: Carmela Hurt;  Location: [facility] NEURO ORS;  Service: Neurosurgery;  Laterality: N/A;  Anterior Cervical Four-Five/Five-Six Decompression and Fusion with Plating and Bonegraft   BLADDER SURGERY     CARDIAC CATHETERIZATION  04/09/2009   EF 60%   CARDIAC CATHETERIZATION  03/24/2001   EF 55%   CARDIAC CATHETERIZATION  01/06/2001   EF 65%   CHOLECYSTECTOMY     CORONARY STENT PLACEMENT  03/2009  STENTING OF THE PROXIMAL TO MID RIGHT CORONARY   IR THORACENTESIS ASP PLEURAL SPACE W/IMG GUIDE  06/01/2018   LEFT HEART CATHETERIZATION WITH CORONARY ANGIOGRAM N/A 12/21/2011   Procedure: LEFT HEART CATHETERIZATION WITH CORONARY ANGIOGRAM;  Surgeon: Peter M Swaziland, MD;  Location: [facility] CATH LAB;  Service: Cardiovascular;  Laterality: N/A;   LOBECTOMY     NODE DISSECTION N/A 04/04/2018   Procedure: NODE DISSECTION;  Surgeon: Loreli Slot, MD;  Location: [facility] OR;  Service: Thoracic;  Laterality: N/A;   OTHER  SURGICAL HISTORY  12/2014   R foot cyst removal   VIDEO ASSISTED THORACOSCOPY (VATS)/ LOBECTOMY Right 04/04/2018   Procedure: VIDEO ASSISTED THORACOSCOPY (VATS)/RIGHT LOWER LOBECTOMY;  Surgeon: Loreli Slot, MD;  Location: [facility] OR;  Service: Thoracic;  Laterality: Right;   Social History:  reports that she has been smoking cigarettes. She has a 70 pack-year smoking history. She has never used smokeless tobacco. She reports that she does not drink alcohol and does not use drugs.  Allergies  Allergen Reactions   Ace Inhibitors Other (See Comments)    Worsening renal function    Codeine Nausea Only   Codeine-Guaifenesin [Guaifenesin-Codeine] Nausea Only and Other (See Comments)    Some nausea--but ok with percocet    Family History  Problem Relation Age of Onset   Aneurysm Mother    Heart attack Father    Heart failure Father    Stroke Sister    Heart attack Brother     Prior to Admission medications   Medication Sig Start Date End Date Taking? Authorizing Provider  acetaminophen (TYLENOL) 500 MG tablet Take 2 tablets (1,000 mg total) by mouth every 6 (six) hours. 04/09/18   Sharlene Dory, PA-C  albuterol (VENTOLIN HFA) 108 (90 Base) MCG/ACT inhaler Inhale 2 puffs into the lungs every 6 (six) hours as needed for shortness of breath or wheezing. 02/17/23   [provider]  allopurinol (ZYLOPRIM) 100 MG tablet Take 100 mg by mouth daily.     [provider]  amiodarone (PACERONE) 200 MG tablet Take 1 tablet (200 mg total) by mouth daily. 12/09/22   Duke, Roe Rutherford, PA  apixaban (ELIQUIS) 5 MG TABS tablet Take 1 tablet (5 mg total) by mouth 2 (two) times daily. 12/09/22   Duke, Roe Rutherford, PA  Ascorbic Acid (VITAMIN C PO) Take 1,000 mg by mouth daily.    [provider]  bisoprolol (ZEBETA) 5 MG tablet Take 0.5 tablets (2.5 mg total) by mouth daily. 12/09/22   Duke, Roe Rutherford, PA  busPIRone (BUSPAR) 15 MG tablet Take 15 mg by mouth 2 (two) times  daily.    [provider]  Calcium Carbonate-Vitamin D (CALCIUM 600 + D PO) Take 1 tablet by mouth daily.    [provider]  carisoprodol (SOMA) 350 MG tablet Take 1 tablet by mouth 4 (four) times daily as needed for muscle spasms. 04/15/18   [provider]  Cholecalciferol (VITAMIN D) 2000 UNITS tablet Take 4,000 Units by mouth daily.    [provider]  diazepam (VALIUM) 5 MG tablet Take 5 mg by mouth daily.    [provider]  doxycycline (VIBRAMYCIN) 100 MG capsule Take 100 mg by mouth daily. Patient not taking: Reported on 12/09/2022    [provider]  gabapentin (NEURONTIN) 600 MG tablet TAKE 1 TABLET(600 MG) BY MOUTH FOUR TIMES DAILY AT 8 AM AND AT 1 PM AND AT 6 PM AND AT 10 PM Patient taking differently:  Take 600 mg by mouth in the morning, at noon, in the evening, and at bedtime. TAKE 1 TABLET(600 MG) BY MOUTH FOUR TIMES DAILY AT 8 AM AND AT 1 PM AND AT 6 PM AND AT 10 PM 07/22/20   York Spaniel, MD  hydrochlorothiazide (MICROZIDE) 12.5 MG capsule Take 12.5 mg by mouth daily. 04/20/23   [provider]  hydrocortisone (CORTEF) 20 MG tablet Take 20 mg by mouth daily.    [provider]  ibuprofen (ADVIL) 200 MG tablet Take 200 mg by mouth every 6 (six) hours as needed for headache.    [provider]  indomethacin (INDOCIN SR) 75 MG CR capsule  04/05/19   [provider]  ketorolac (ACULAR) 0.4 % SOLN Place 1 drop into the left eye 4 (four) times daily. Patient not taking: Reported on 12/09/2022 08/22/21   [provider]  Multiple Vitamin (MULTIVITAMIN WITH MINERALS) TABS tablet Take 1 tablet by mouth daily. 08/30/18   Roberto Scales D, MD  nicotine (NICODERM CQ - DOSED IN MG/24 HOURS) 21 mg/24hr patch Place 1 patch (21 mg total) onto the skin daily. Patient not taking: Reported on 12/09/2022 08/30/18   Roberto Scales D, MD  nitroGLYCERIN (NITROSTAT) 0.4 MG SL tablet PLACE 1 TABLET UNDER THE TONGUE  EVERY 5 MINUTES AS NEEDED CHEST PAIN. Patient taking differently: Place 0.4 mg under the tongue every 5 (five) minutes as needed for chest pain. 05/25/22   Swaziland, Peter M, MD  oxyCODONE-acetaminophen (PERCOCET) 10-325 MG tablet Take 1 tablet by mouth 4 (four) times daily as needed for pain. 04/13/18   [provider]  rosuvastatin (CRESTOR) 40 MG tablet TAKE 1 TABLET BY MOUTH DAILY 12/09/22   Duke, Roe Rutherford, PA  torsemide (DEMADEX) 20 MG tablet Take 1 tablet (20 mg total) by mouth daily. 12/09/22   Marcelino Duster, PA  traZODone (DESYREL) 50 MG tablet  01/25/19   [provider]  valACYclovir (VALTREX) 1000 MG tablet Take 500 mg by mouth daily.    [provider]  venlafaxine XR (EFFEXOR-XR) 150 MG 24 hr capsule Take 150 mg by mouth daily.    [provider]    Physical Exam: Vitals:   05/12/23 1500 05/12/23 1502 05/12/23 1510 05/12/23 1530  BP: (!) 170/86     Pulse: 77  79 80  Resp: 16  17 20   Temp:  98.1 F (36.7 C)    TempSrc:  Oral    SpO2: 100%  99% 100%  Weight:      Height:       Physical Exam Constitutional:      Appearance: She is obese. She is not ill-appearing.  HENT:     Head: Normocephalic and atraumatic.     Mouth/Throat:     Pharynx: Oropharynx is clear. No oropharyngeal exudate.  Eyes:     Extraocular Movements: Extraocular movements intact.     Pupils: Pupils are equal, round, and reactive to light.  Cardiovascular:     Rate and Rhythm: Normal rate and regular rhythm.     Pulses: Normal pulses.     Heart sounds: Normal heart sounds. No murmur heard.    No friction rub. No gallop.  Pulmonary:     Effort: Pulmonary effort is normal. No tachypnea, accessory muscle usage or respiratory distress.     Breath sounds: Examination of the left-lower field reveals wheezing. Wheezing present. No rhonchi or rales.     Comments: Absent breath sounds at right lung base (hx  of lobectomy). Saturating >94% on home 3L [place].  Abdominal:      General: Bowel sounds are normal. There is no distension.     Palpations: Abdomen is soft.     Tenderness: There is no abdominal tenderness. There is no guarding or rebound.  Musculoskeletal:        General: No swelling. Normal range of motion.     Right lower leg: No edema.     Left lower leg: No edema.  Skin:    General: Skin is warm and dry.  Neurological:     General: No focal deficit present.     Mental Status: She is alert.  Psychiatric:        Mood and Affect: Mood normal.        Behavior: Behavior normal.     Data Reviewed:     Latest Ref Rng & Units 05/12/2023    1:49 PM 05/12/2023   11:44 AM 12/09/2022   12:11 PM  CBC  WBC 4.0 - 10.5 K/uL  15.7  7.1   Hemoglobin 12.0 - 15.0 g/dL 09.8  11.9  14.7   Hematocrit 36.0 - 46.0 % 33.0  33.1  34.3   Platelets 150 - 400 K/uL  421  246       Latest Ref Rng & Units 05/12/2023    1:49 PM 05/12/2023   11:44 AM 12/09/2022   12:11 PM  BMP  Glucose 70 - 99 mg/dL  829  562   BUN 8 - 23 mg/dL  39  37   Creatinine 1.30 - 1.00 mg/dL  8.65  7.84   BUN/Creat Ratio 12 - 28   21   Sodium 135 - 145 mmol/L 130  133  137   Potassium 3.5 - 5.1 mmol/L 3.8  2.7  4.5   Chloride 98 - 111 mmol/L  79  91   CO2 22 - 32 mmol/L  35  32   Calcium 8.9 - 10.3 mg/dL  9.4  9.1    DG Chest 2 View Result Date: 05/12/2023 CLINICAL DATA:  Shortness of breath. EXAM: CHEST - 2 VIEW COMPARISON:  08/29/2018. FINDINGS: Low lung volume. Bilateral lungs appear hyperlucent with coarse bronchovascular markings, which are similar to the prior study and corresponds to emphysematous changes on the prior CT scan from 03/03/2022. No frank pulmonary edema. There is blunting of bilateral posterior costophrenic angles, suggesting bilateral trace pleural effusions. There are atelectatic changes at the right lung base. Bilateral lung fields are otherwise clear. No dense consolidation or lung collapse. No pneumothorax. Stable cardio-mediastinal silhouette. No acute osseous  abnormalities. Lower cervical spinal fixation hardware noted. The soft tissues are within normal limits. IMPRESSION: *Bilateral trace pleural effusions. COPD. No dense consolidation, lung collapse or pneumothorax. Electronically Signed   By: Jules Schick M.D.   On: 05/12/2023 14:39     Assessment and Plan: No notes have been filed under this hospital service. Service: Hospitalist  Acute on chronic hypoxic and hypercarbic respiratory failure COPD with acute exacerbation  COVID-19 infection Hx of SCC of right lung s/p lobectomy Patient with history of chronic hypoxic respiratory failure on 3 L nasal cannula at baseline.  She presented with complaints of shortness of breath for the past several days and found to have an acute COPD exacerbation on arrival.  Her respiratory panel is positive for COVID-19 though fortunately chest x-ray not showing any focal pneumonia.  She is back on her home 3 L nasal cannula and does not appear to  be in any significant respiratory distress.  She does have mild wheezing in the left lung base.  Though she does have a leukocytosis, she has otherwise remained afebrile.  VBG showing a mild hypercarbia and she does have a compensatory metabolic alkalosis as her hypercarbia is likely chronic.  Given her work of breathing has improved, will avoid BiPAP for now.  It does not appear that she is on any inhalers at home.  Provide supportive care for COVID. Elwin Sleight twice daily, Incruse Ellipta, albuterol nebs every 4 hours as needed -Prednisone 40 mg daily for 4 more days -Azithromycin 500 mg daily for 5 days -Follow-up expectorated sputum assessment with Gram stain -Flutter valve, incentive spirometry -Supplemental oxygen as needed to maintain O2 sats greater than 88% -Aspiration precautions -PT/OT eval -Place in observation  Hypokalemia Potassium 2.7 on arrival.  Magnesium level normal.  Given of potassium orally along with 1 run of IV potassium.  -Check potassium  tomorrow morning -Supplement as needed  Paroxysmal Afib Chronic diastolic CHF Patient with documented diastolic CHF.  Last echo in 2020 showing LVEF 55 to 60% along with LV diastolic parameters consistent with impaired relaxation.  She is on bisoprolol 2.5 mg daily, torsemide 20 mg daily, amiodarone 200 mg daily at home.  She also has a history of paroxysmal A-fib and is chronically anticoagulated with Eliquis.  She is euvolemic on exam and has been rate controlled since arrival to the ED. -Resume home Eliquis -Resume home bisoprolol, amiodarone, torsemide -Strict I/O's, daily weights  HTN HLD Patient on HCTZ 12.5 mg daily, amiodarone 200 mg daily, bisoprolol 2.5 mg daily at home.  On Crestor 40 mg daily for hyperlipidemia. -Resume home HCTZ, amiodarone, bisoprolol -Resume home Crestor  Controlled T2DM Patient with well-controlled type 2 diabetes, not on any home medications for this.  Hemoglobin A1c was 6.5% about 4 years ago, no recent labs that I can see since that time.  Blood glucose on arrival 186, did receive Solu-Medrol en route.  Will trend CBGs for now. -Follow-up hemoglobin A1c -Trend CBGs, goal 140-180 -Can consider adding SSI if CBGs above goal -Decreased home gabapentin 300 mg 3 times daily  Stage 3 CKD Creatinine on arrival 1.69.  Baseline creatinine appears to be around 1.6-1.8. -Kidney function stable -Trend kidney function -Monitor urine output  Depression and anxiety -Resume home venlafaxine 150 mg daily and trazodone 50 mg nightly -Resume home buspirone 15 mg twice daily   Advance Care Planning:   Code Status: Full Code   Consults: none  Family Communication: no family at bedside  Severity of Illness: The appropriate patient status for this patient is OBSERVATION. Observation status is judged to be reasonable and necessary in order to provide the required intensity of service to ensure the patient's safety. The patient's presenting symptoms, physical exam  findings, and initial radiographic and laboratory data in the context of their medical condition is felt to place them at decreased risk for further clinical deterioration. Furthermore, it is anticipated that the patient will be medically stable for discharge from the hospital within 2 midnights of admission.   Portions of this note were generated with Scientist, clinical (histocompatibility and immunogenetics). Dictation errors may occur despite best attempts at proofreading.  Author: Briscoe Burns, MD 05/12/2023 3:49 PM  For on call review [email or web address].

## 2023-05-12 NOTE — Care Management Obs Status (Signed)
MEDICARE OBSERVATION STATUS NOTIFICATION   Patient Details  Name: Maria Arroyo MRN: [id number] Date of Birth: 09-Dec-1950   Medicare Observation Status Notification Given:  Waylan Boga, RN 05/12/2023, 10:09 PM

## 2023-05-13 ENCOUNTER — Other Ambulatory Visit: Payer: Self-pay

## 2023-05-13 DIAGNOSIS — J449 Chronic obstructive pulmonary disease, unspecified: Secondary | ICD-10-CM | POA: Diagnosis present

## 2023-05-13 DIAGNOSIS — I5032 Chronic diastolic (congestive) heart failure: Secondary | ICD-10-CM | POA: Diagnosis present

## 2023-05-13 DIAGNOSIS — E876 Hypokalemia: Secondary | ICD-10-CM | POA: Diagnosis present

## 2023-05-13 DIAGNOSIS — N179 Acute kidney failure, unspecified: Secondary | ICD-10-CM | POA: Diagnosis present

## 2023-05-13 DIAGNOSIS — E1141 Type 2 diabetes mellitus with diabetic mononeuropathy: Secondary | ICD-10-CM | POA: Diagnosis present

## 2023-05-13 DIAGNOSIS — J441 Chronic obstructive pulmonary disease with (acute) exacerbation: Secondary | ICD-10-CM | POA: Diagnosis present

## 2023-05-13 DIAGNOSIS — F32A Depression, unspecified: Secondary | ICD-10-CM | POA: Diagnosis present

## 2023-05-13 DIAGNOSIS — I2510 Atherosclerotic heart disease of native coronary artery without angina pectoris: Secondary | ICD-10-CM | POA: Diagnosis present

## 2023-05-13 DIAGNOSIS — U071 COVID-19: Secondary | ICD-10-CM | POA: Diagnosis present

## 2023-05-13 DIAGNOSIS — I480 Paroxysmal atrial fibrillation: Secondary | ICD-10-CM | POA: Diagnosis present

## 2023-05-13 DIAGNOSIS — E669 Obesity, unspecified: Secondary | ICD-10-CM | POA: Diagnosis present

## 2023-05-13 DIAGNOSIS — E873 Alkalosis: Secondary | ICD-10-CM | POA: Diagnosis present

## 2023-05-13 DIAGNOSIS — J9621 Acute and chronic respiratory failure with hypoxia: Secondary | ICD-10-CM | POA: Diagnosis present

## 2023-05-13 DIAGNOSIS — J9622 Acute and chronic respiratory failure with hypercapnia: Secondary | ICD-10-CM | POA: Diagnosis present

## 2023-05-13 DIAGNOSIS — M109 Gout, unspecified: Secondary | ICD-10-CM | POA: Diagnosis present

## 2023-05-13 DIAGNOSIS — N1832 Chronic kidney disease, stage 3b: Secondary | ICD-10-CM | POA: Diagnosis present

## 2023-05-13 DIAGNOSIS — N390 Urinary tract infection, site not specified: Secondary | ICD-10-CM | POA: Diagnosis present

## 2023-05-13 DIAGNOSIS — E871 Hypo-osmolality and hyponatremia: Secondary | ICD-10-CM | POA: Diagnosis present

## 2023-05-13 DIAGNOSIS — F17210 Nicotine dependence, cigarettes, uncomplicated: Secondary | ICD-10-CM | POA: Diagnosis present

## 2023-05-13 DIAGNOSIS — I130 Hypertensive heart and chronic kidney disease with heart failure and stage 1 through stage 4 chronic kidney disease, or unspecified chronic kidney disease: Secondary | ICD-10-CM | POA: Diagnosis present

## 2023-05-13 DIAGNOSIS — E1165 Type 2 diabetes mellitus with hyperglycemia: Secondary | ICD-10-CM | POA: Diagnosis present

## 2023-05-13 DIAGNOSIS — D649 Anemia, unspecified: Secondary | ICD-10-CM | POA: Diagnosis present

## 2023-05-13 DIAGNOSIS — E785 Hyperlipidemia, unspecified: Secondary | ICD-10-CM | POA: Diagnosis present

## 2023-05-13 DIAGNOSIS — E1122 Type 2 diabetes mellitus with diabetic chronic kidney disease: Secondary | ICD-10-CM | POA: Diagnosis present

## 2023-05-13 LAB — CBC
HCT: 29.3 % — ABNORMAL LOW (ref 36.0–46.0)
Hemoglobin: 9.3 g/dL — ABNORMAL LOW (ref 12.0–15.0)
MCH: 27.9 pg (ref 26.0–34.0)
MCHC: 31.7 g/dL (ref 30.0–36.0)
MCV: 88 fL (ref 80.0–100.0)
Platelets: 398 10*3/uL (ref 150–400)
RBC: 3.33 MIL/uL — ABNORMAL LOW (ref 3.87–5.11)
RDW: 15.3 % (ref 11.5–15.5)
WBC: 18 10*3/uL — ABNORMAL HIGH (ref 4.0–10.5)
nRBC: 0 % (ref 0.0–0.2)

## 2023-05-13 LAB — HEMOGLOBIN A1C
Hgb A1c MFr Bld: 7 % — ABNORMAL HIGH (ref 4.8–5.6)
Mean Plasma Glucose: 154.2 mg/dL

## 2023-05-13 LAB — BASIC METABOLIC PANEL
Anion gap: 15 (ref 5–15)
BUN: 48 mg/dL — ABNORMAL HIGH (ref 8–23)
CO2: 35 mmol/L — ABNORMAL HIGH (ref 22–32)
Calcium: 9.1 mg/dL (ref 8.9–10.3)
Chloride: 80 mmol/L — ABNORMAL LOW (ref 98–111)
Creatinine, Ser: 1.85 mg/dL — ABNORMAL HIGH (ref 0.44–1.00)
GFR, Estimated: 29 mL/min — ABNORMAL LOW (ref >60)
Glucose, Bld: 345 mg/dL — ABNORMAL HIGH (ref 70–99)
Potassium: 3.3 mmol/L — ABNORMAL LOW (ref 3.5–5.1)
Sodium: 130 mmol/L — ABNORMAL LOW (ref 135–145)

## 2023-05-13 LAB — CBG MONITORING, ED
Glucose-Capillary: 189 mg/dL — ABNORMAL HIGH (ref 70–99)
Glucose-Capillary: 210 mg/dL — ABNORMAL HIGH (ref 70–99)

## 2023-05-13 LAB — HIV ANTIBODY (ROUTINE TESTING W REFLEX): HIV Screen 4th Generation wRfx: NONREACTIVE

## 2023-05-13 LAB — GLUCOSE, CAPILLARY
Glucose-Capillary: 299 mg/dL — ABNORMAL HIGH (ref 70–99)
Glucose-Capillary: 254 mg/dL — ABNORMAL HIGH (ref 70–99)

## 2023-05-13 MED ORDER — AZITHROMYCIN 500 MG PO TABS
500.0000 mg | ORAL_TABLET | Freq: Every day | ORAL | Status: AC
  Administered 2023-05-14: 500 mg via ORAL
  Filled 2023-05-13: qty 1

## 2023-05-13 MED ORDER — INSULIN ASPART 100 UNIT/ML IJ SOLN
0.0000 [IU] | Freq: Every day | INTRAMUSCULAR | Status: DC
  Administered 2023-05-13: 3 [IU] via SUBCUTANEOUS
  Administered 2023-05-14: 4 [IU] via SUBCUTANEOUS

## 2023-05-13 MED ORDER — ARFORMOTEROL TARTRATE 15 MCG/2ML IN NEBU
15.0000 ug | INHALATION_SOLUTION | Freq: Two times a day (BID) | RESPIRATORY_TRACT | Status: DC
  Administered 2023-05-13 – 2023-05-16 (×6): 15 ug via RESPIRATORY_TRACT
  Filled 2023-05-13 (×6): qty 2

## 2023-05-13 MED ORDER — INSULIN ASPART 100 UNIT/ML IJ SOLN
0.0000 [IU] | Freq: Three times a day (TID) | INTRAMUSCULAR | Status: DC
  Administered 2023-05-13: 5 [IU] via SUBCUTANEOUS
  Administered 2023-05-13 – 2023-05-14 (×2): 3 [IU] via SUBCUTANEOUS
  Administered 2023-05-14: 2 [IU] via SUBCUTANEOUS
  Administered 2023-05-14: 5 [IU] via SUBCUTANEOUS
  Administered 2023-05-15: 7 [IU] via SUBCUTANEOUS
  Administered 2023-05-15: 1 [IU] via SUBCUTANEOUS
  Administered 2023-05-15: 7 [IU] via SUBCUTANEOUS
  Administered 2023-05-16: 2 [IU] via SUBCUTANEOUS

## 2023-05-13 MED ORDER — POTASSIUM CHLORIDE CRYS ER 20 MEQ PO TBCR
40.0000 meq | EXTENDED_RELEASE_TABLET | Freq: Once | ORAL | Status: AC
  Administered 2023-05-13: 40 meq via ORAL
  Filled 2023-05-13: qty 2

## 2023-05-13 MED ORDER — POTASSIUM CHLORIDE IN NACL 40-0.9 MEQ/L-% IV SOLN
INTRAVENOUS | Status: AC
  Filled 2023-05-13 (×2): qty 1000

## 2023-05-13 MED ORDER — ISOSORBIDE DINITRATE 10 MG PO TABS
10.0000 mg | ORAL_TABLET | Freq: Three times a day (TID) | ORAL | Status: DC
  Administered 2023-05-13 – 2023-05-16 (×10): 10 mg via ORAL
  Filled 2023-05-13 (×12): qty 1

## 2023-05-13 MED ORDER — HYDRALAZINE HCL 25 MG PO TABS
25.0000 mg | ORAL_TABLET | Freq: Three times a day (TID) | ORAL | Status: DC
  Administered 2023-05-13 – 2023-05-16 (×10): 25 mg via ORAL
  Filled 2023-05-13 (×10): qty 1

## 2023-05-13 MED ORDER — INSULIN GLARGINE-YFGN 100 UNIT/ML ~~LOC~~ SOLN
18.0000 [IU] | Freq: Every day | SUBCUTANEOUS | Status: DC
  Administered 2023-05-13 – 2023-05-15 (×3): 18 [IU] via SUBCUTANEOUS
  Filled 2023-05-13 (×3): qty 0.18

## 2023-05-13 MED ORDER — BUDESONIDE 0.25 MG/2ML IN SUSP
0.2500 mg | Freq: Two times a day (BID) | RESPIRATORY_TRACT | Status: DC
Start: 2023-05-13 — End: 2023-05-14
  Administered 2023-05-13 – 2023-05-14 (×3): 0.25 mg via RESPIRATORY_TRACT
  Filled 2023-05-13 (×3): qty 2

## 2023-05-13 NOTE — Evaluation (Signed)
Physical Therapy Evaluation Patient Details Name: Maria Arroyo MRN: [id number] DOB: 1950/04/09 Today's Date: 05/13/2023  History of Present Illness  Maria Arroyo is a 73 y/o female presented 05/12/23 under observation with SOB for the past several days and found to have an acute COPD exacerbation on arrival.  Her respiratory panel is positive for COVID-19 though fortunately chest x-ray not showing any focal pneumonia. 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 female admitted with above HPI and diagnosis. Patient is currently limited by functional impairments below (see PT problem list). Patient lives with spouse and is independent with occasional use of RW at baseline. Pt reports she has had several falls related to not using RW and tripping on Oxygen line. Currently pt able to complete bed mobility at supervision, and transfers and gait with HHA with CGA for safety. Pt will benefit from education on use of rolaltor for gait and energy conservation. Patient will benefit from continued skilled PT interventions to address impairments and progress independence with mobility, recommending HHPT and rollator for energy conservation with ambulation. Acute PT will follow and progress as able.         If plan is discharge home, recommend the following: A little help with walking and/or transfers;A little help with bathing/dressing/bathroom;Assistance with cooking/housework;Direct supervision/assist for medications management;Assist for transportation;Help with stairs or ramp for entrance   Can travel by private vehicle        Equipment Recommendations Rollator (4 wheels)  Recommendations for Other Services        Functional Status Assessment Patient has had a recent decline in their functional status and demonstrates the ability to make significant improvements in function in a reasonable and predictable amount of time.     Precautions / Restrictions Precautions Precautions: Fall Recall of Precautions/Restrictions: Intact Precaution/Restrictions Comments: Covid + Restrictions Weight Bearing Restrictions Per Provider Order: No      Mobility  Bed Mobility Overal bed mobility: Needs Assistance Bed Mobility: Supine to Sit, Sit to Supine     Supine to sit: Supervision, HOB elevated Sit to supine: Supervision   General bed mobility comments: sup for safety    Transfers Overall transfer level: Needs assistance Equipment used: 1 person hand held assist, None Transfers: Sit to/from Stand Sit to Stand: Contact guard assist           General transfer comment: CGA for safety, use of hands to rise and lower    Ambulation/Gait Ambulation/Gait assistance: Contact guard assist Gait Distance (Feet): 10 Feet Assistive device: 1 person hand held assist, None Gait Pattern/deviations: Step-through pattern, Decreased step length - right, Decreased step length - left, Decreased stride length Gait velocity: decr     General Gait Details: CGA for safety, pt slighlty unsteady with lateral, fowrard, and backward steps at EOB.  Stairs            Wheelchair Mobility     Tilt Bed    Modified Rankin (Stroke Patients Only)       Balance Overall balance assessment: Needs assistance Sitting-balance support: Feet supported, Bilateral upper extremity supported Sitting balance-Leahy Scale: Good     Standing balance support: During functional activity, No upper extremity supported Standing balance-Leahy Scale: Fair  Pertinent Vitals/Pain Pain Assessment Pain Assessment: No/denies pain    Home Living Family/patient expects to be  discharged to:: Private residence Living Arrangements: Spouse/significant other Available Help at Discharge: Family   Home Access: Stairs to enter Entrance Stairs-Rails: Can reach both Entrance Stairs-Number of Steps: 6   Home Layout: One level Home Equipment: Agricultural consultant (2 wheels);Cane - single point;Shower seat - built in;Grab bars - tub/shower Additional Comments: neice is available to assist at home    Prior Function Prior Level of Function : Independent/Modified Independent;Driving             Mobility Comments: usually walks iwth no AD, trips on her concentrator cord. ADLs Comments: ind with ADL's     Extremity/Trunk Assessment   Upper Extremity Assessment Upper Extremity Assessment: Overall WFL for tasks assessed;Defer to OT evaluation    Lower Extremity Assessment Lower Extremity Assessment: Overall WFL for tasks assessed (grossly 4/5 throughout bil LE)    Cervical / Trunk Assessment Cervical / Trunk Assessment: Normal  Communication   Communication Communication: No apparent difficulties    Cognition Arousal: Alert Behavior During Therapy: WFL for tasks assessed/performed   PT - Cognitive impairments: Safety/Judgement                         Following commands: Intact       Cueing Cueing Techniques: Verbal cues     General Comments      Exercises     Assessment/Plan    PT Assessment Patient needs continued PT services  PT Problem List Cardiopulmonary status limiting activity;Decreased activity tolerance;Decreased balance;Decreased mobility       PT Treatment Interventions DME instruction;Gait training;Stair training;Functional mobility training;Therapeutic activities;Therapeutic exercise;Balance training;Neuromuscular re-education;Patient/family education    PT Goals (Current goals can be found in the Care Plan section)  Acute Rehab PT Goals Patient Stated Goal: get better and go home PT Goal Formulation: With  patient/family Time For Goal Achievement: 05/27/23 Potential to Achieve Goals: Good    Frequency Min 1X/week     Co-evaluation               AM-PAC PT "6 Clicks" Mobility  Outcome Measure Help needed turning from your back to your side while in a flat bed without using bedrails?: None Help needed moving from lying on your back to sitting on the side of a flat bed without using bedrails?: A Little Help needed moving to and from a bed to a chair (including a wheelchair)?: A Little Help needed standing up from a chair using your arms (e.g., wheelchair or bedside chair)?: A Little Help needed to walk in hospital room?: A Little Help needed climbing 3-5 steps with a railing? : A Little 6 Click Score: 19    End of Session Equipment Utilized During Treatment: Gait belt;Oxygen Activity Tolerance: Patient tolerated treatment well Patient left: in bed;with call bell/phone within reach;with family/visitor present Nurse Communication: Mobility status PT Visit Diagnosis: Unsteadiness on feet (R26.81);Other abnormalities of gait and mobility (R26.89);Difficulty in walking, not elsewhere classified (R26.2)    Time: 0912-0933 PT Time Calculation (min) (ACUTE ONLY): 21 min   Charges:   PT Evaluation $PT Eval Low Complexity: 1 Low   PT General Charges $$ ACUTE PT VISIT: 1 Visit         Wynn Maudlin, DPT Acute Rehabilitation Services Office [phone]  05/13/23 11:56 AM

## 2023-05-13 NOTE — Evaluation (Signed)
Occupational Therapy Evaluation Patient Details Name: Maria Arroyo MRN: [id number] DOB: May 24, 1950 Today's Date: 05/13/2023   History of Present Illness   Maria Arroyo is a 73 y/o female presented 05/12/23 under observation with SOB for the past several days and found to have an acute COPD exacerbation on arrival.  Her respiratory panel is positive for COVID-19 though fortunately chest x-ray not showing any focal pneumonia. 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 She does not use DME for mobility, and she has a walk in shower with shower seat and grab bars for bathing, does her own dressing. 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 Today Pt on 3 L via [place] throughout session and maintained SpO2>90% with short sit<>sand from ED stretcher with steps up for repositioning face to face with therapist. OT initiated energy conservation education, and Pt will need demo of AE for LB ADL for energy conservation. Pt overall min A for ADL and will benefit from skilled OT in the acute setting as well as afterwards at the [facility] level. Pt with good support from Niece throughout session, and can support post dc. Next session plan on AE demo, take [facility] handout, and focus on OOB activity.      If plan is discharge home, recommend the following:   A little help with walking and/or transfers;A little help with bathing/dressing/bathroom;Assistance with cooking/housework;Direct supervision/assist for medications management;Assist for transportation;Help with stairs or ramp for entrance     Functional Status Assessment   Patient has had a recent decline in their functional status and demonstrates the ability  to make significant improvements in function in a reasonable and predictable amount of time.     Equipment Recommendations   BSC/3in1     Recommendations for Other Services   PT consult     Precautions/Restrictions   Precautions Precautions: Fall Recall of Precautions/Restrictions: Intact Precaution/Restrictions Comments: Covid + Restrictions Weight Bearing Restrictions Per Provider Order: No     Mobility Bed Mobility Overal bed mobility: Needs Assistance Bed Mobility: Supine to Sit, Sit to Supine     Supine to sit: Supervision, HOB elevated Sit to supine: Supervision   General bed mobility comments: ED stretcher    Transfers Overall transfer level: Needs assistance Equipment used: 1 person hand held assist, None Transfers: Sit to/from Stand Sit to Stand: Contact guard assist           General transfer comment: from ED stretcher (high surface)      Balance Overall balance assessment: Needs assistance Sitting-balance support: Feet supported, Bilateral upper extremity supported Sitting balance-Leahy Scale: Good     Standing balance support: During functional activity, No upper extremity supported Standing balance-Leahy Scale: Fair                             ADL either performed or assessed with clinical judgement   ADL Overall ADL's : Needs assistance/impaired Eating/Feeding: Independent   Grooming: Set up;Sitting Grooming Details (indicate cue type and reason): ED stretcher Upper Body Bathing: Set up;Sitting   Lower Body Bathing: Set up;With adaptive equipment;Sit to/from stand Lower Body Bathing Details (indicate cue type and reason): would benefit from AE education Upper  Body Dressing : Minimal assistance;Sitting   Lower Body Dressing: Moderate assistance;Sit to/from stand   Toilet Transfer: Software engineer Details (indicate cue type and reason): simulated through steps up ED stretcher          Functional mobility during ADLs: Contact guard assist (face to face for safety) General ADL Comments: decreased activity tolerance, decreased balance, needs continued energy conservation and AE education     Vision Baseline Vision/History: 1 Wears glasses Ability to See in Adequate Light: 0 Adequate Patient Visual Report: No change from baseline Vision Assessment?: No apparent visual deficits     Perception Perception: Not tested       Praxis Praxis: Not tested       Pertinent Vitals/Pain Pain Assessment Pain Assessment: No/denies pain     Extremity/Trunk Assessment Upper Extremity Assessment Upper Extremity Assessment: Overall WFL for tasks assessed   Lower Extremity Assessment Lower Extremity Assessment: Defer to PT evaluation   Cervical / Trunk Assessment Cervical / Trunk Assessment: Normal   Communication Communication Communication: No apparent difficulties   Cognition Arousal: Alert Behavior During Therapy: WFL for tasks assessed/performed Cognition: No apparent impairments             OT - Cognition Comments: pleasant, oriented, cooperative STM deficits that are age appropriate                 Following commands: Intact       Cueing  General Comments   Cueing Techniques: Verbal cues      Exercises     Shoulder Instructions      Home Living Family/patient expects to be discharged to:: Private residence Living Arrangements: Spouse/significant other Available Help at Discharge: Family Type of Home: House Home Access: Stairs to enter Secretary/administrator of Steps: 6 Entrance Stairs-Rails: Can reach both Home Layout: One level     Bathroom Shower/Tub: Producer, television/film/video: Handicapped height Bathroom Accessibility: Yes   Home Equipment: Agricultural consultant (2 wheels);Cane - single point;Shower seat - built in;Grab bars - tub/shower   Additional Comments: neice is available to assist at home      Prior  Functioning/Environment Prior Level of Function : Independent/Modified Independent;Driving             Mobility Comments: usually walks iwth no AD, trips on her concentrator cord. ADLs Comments: mod ind with ADL's    OT Problem List: Decreased activity tolerance;Impaired balance (sitting and/or standing);Cardiopulmonary status limiting activity;Decreased safety awareness   OT Treatment/Interventions: Self-care/ADL training;Therapeutic exercise;DME and/or AE instruction;Energy conservation;Therapeutic activities;Patient/family education;Balance training      OT Goals(Current goals can be found in the care plan section)   Acute Rehab OT Goals Patient Stated Goal: feel better, get a nap OT Goal Formulation: With patient/family Time For Goal Achievement: 05/27/23 Potential to Achieve Goals: Good ADL Goals Pt Will Perform Grooming: with modified independence;sitting Pt Will Perform Upper Body Dressing: with modified independence;sitting Pt Will Perform Lower Body Dressing: with modified independence;sit to/from stand Pt Will Transfer to Toilet: with modified independence;ambulating Pt Will Perform Toileting - Clothing Manipulation and hygiene: with modified independence;sitting/lateral leans;sit to/from stand Additional ADL Goal #1: Pt will verbalize at least 3 energy conservation strategies for ADL with no cues   OT Frequency:  Min 1X/week    Co-evaluation              AM-PAC OT "6 Clicks" Daily Activity     Outcome Measure Help from another person eating meals?: None Help from  another person taking care of personal grooming?: A Little Help from another person toileting, which includes using toliet, bedpan, or urinal?: A Little Help from another person bathing (including washing, rinsing, drying)?: A Little Help from another person to put on and taking off regular upper body clothing?: A Little Help from another person to put on and taking off regular lower body  clothing?: A Lot 6 Click Score: 18   End of Session Equipment Utilized During Treatment: Gait belt;Oxygen (3L) Nurse Communication: Mobility status  Activity Tolerance: Patient tolerated treatment well Patient left: Other (comment) (ED stretcher)  OT Visit Diagnosis: Unsteadiness on feet (R26.81);Muscle weakness (generalized) (M62.81)                Time: 1610-9604 OT Time Calculation (min): 25 min Charges:  OT General Charges $OT Visit: 1 Visit OT Evaluation $OT Eval Low Complexity: 1 Low  Nyoka Cowden OTR/L Acute Rehabilitation Services Office: [phone]   Evern Bio [facility] 05/13/2023, 1:34 PM

## 2023-05-13 NOTE — Progress Notes (Signed)
  Progress Note   Patient: Maria Arroyo ZOX:[id number] DOB: April 25, 1950 DOA: 05/12/2023     0 DOS: the patient was seen and examined on 05/13/2023    Assessment and Plan: Acute on chronic hypoxic/hypercarbic resp failure due to COPD exacerbation and COVID 19 infection - IV azithromycin 500 mg daily  - Albuterol q4 hr PRN  - Pulmicort/brovana bid  - Prednisone 40 mg PO daily   HypoK+ - IV NS 40K+ @ 75 cc/hr   PAF - Amiodarone 200 mg PO daily  - Eliquis 5 mg PO bid  - Bisoprolol 2.5 mg PO daily   HTN - Hydralazine 25 mg PO q8hr  - Isordil 10 mg PO tid   HLD  - Crestor 40 mg PO daily   DM2 - Novolog SS ACHS  - Semglee 18 units sq daily  - Gabapentin 300 mg PO tid   Stage 3CKD  - IV fluids as above   Depression/anxiety  - Buspar 15 mg PO bid  - Trazodone 50 mg PO at bedtime PRN  - Effexor-xr 150 mg PO daily   Subjective: Pt seen and examined at the bedside. All diuretics stopped. IV fluids with K+ ordered due to AKI on CKD. All inhalers stopped and pulmicort/brovana neb's started. SS insulin and semglee started due to hyperglycemia. She is on 4L oxygen right now and her baseline is 3L. She shall remain in the hospital for continued respiratory treatment.  Physical Exam: Vitals:   05/12/23 2030 05/13/23 0130 05/13/23 0400 05/13/23 0816  BP: (!) 155/94 (!) 157/81 (!) 141/73 (!) 152/85  Pulse: 62 61 64 61  Resp: 17 17 17 16   Temp:    98.5 F (36.9 C)  TempSrc:    Oral  SpO2: 100% 97% 100% 100%  Weight:      Height:       Physical Exam Constitutional:      Appearance: She is well-developed.  HENT:     Head: Normocephalic.  Cardiovascular:     Rate and Rhythm: Normal rate and regular rhythm.  Pulmonary:     Comments: Wheezing b/l Abdominal:     Palpations: Abdomen is soft.  Musculoskeletal:        General: Normal range of motion.  Skin:    General: Skin is warm.  Neurological:     Mental Status: She is alert and oriented to person, place, and  time.  Psychiatric:        Mood and Affect: Mood normal.        Disposition: Status is: Observation The patient remains OBS appropriate and will d/c before 2 midnights.  Planned Discharge Destination: Home    Time spent: 35 minutes  Author: Baron Hamper , MD 05/13/2023 10:35 AM  For on call review [email or web address].

## 2023-05-14 DIAGNOSIS — E785 Hyperlipidemia, unspecified: Secondary | ICD-10-CM

## 2023-05-14 DIAGNOSIS — E876 Hypokalemia: Secondary | ICD-10-CM | POA: Insufficient documentation

## 2023-05-14 DIAGNOSIS — E119 Type 2 diabetes mellitus without complications: Secondary | ICD-10-CM

## 2023-05-14 DIAGNOSIS — U071 COVID-19: Secondary | ICD-10-CM | POA: Diagnosis not present

## 2023-05-14 DIAGNOSIS — J9622 Acute and chronic respiratory failure with hypercapnia: Secondary | ICD-10-CM

## 2023-05-14 DIAGNOSIS — I480 Paroxysmal atrial fibrillation: Secondary | ICD-10-CM

## 2023-05-14 DIAGNOSIS — N179 Acute kidney failure, unspecified: Secondary | ICD-10-CM | POA: Insufficient documentation

## 2023-05-14 DIAGNOSIS — J9621 Acute and chronic respiratory failure with hypoxia: Secondary | ICD-10-CM | POA: Diagnosis not present

## 2023-05-14 DIAGNOSIS — J441 Chronic obstructive pulmonary disease with (acute) exacerbation: Secondary | ICD-10-CM | POA: Diagnosis not present

## 2023-05-14 LAB — SODIUM, URINE, RANDOM: Sodium, Ur: 31 mmol/L

## 2023-05-14 LAB — COMPREHENSIVE METABOLIC PANEL
ALT: 36 U/L (ref 0–44)
AST: 31 U/L (ref 15–41)
Albumin: 2 g/dL — ABNORMAL LOW (ref 3.5–5.0)
Alkaline Phosphatase: 78 U/L (ref 38–126)
Anion gap: 10 (ref 5–15)
BUN: 65 mg/dL — ABNORMAL HIGH (ref 8–23)
CO2: 34 mmol/L — ABNORMAL HIGH (ref 22–32)
Calcium: 8.2 mg/dL — ABNORMAL LOW (ref 8.9–10.3)
Chloride: 87 mmol/L — ABNORMAL LOW (ref 98–111)
Creatinine, Ser: 2.24 mg/dL — ABNORMAL HIGH (ref 0.44–1.00)
GFR, Estimated: 23 mL/min — ABNORMAL LOW (ref >60)
Glucose, Bld: 242 mg/dL — ABNORMAL HIGH (ref 70–99)
Potassium: 4.1 mmol/L (ref 3.5–5.1)
Sodium: 131 mmol/L — ABNORMAL LOW (ref 135–145)
Total Bilirubin: 0.4 mg/dL (ref 0.0–1.2)
Total Protein: 5.5 g/dL — ABNORMAL LOW (ref 6.5–8.1)

## 2023-05-14 LAB — URINALYSIS, COMPLETE (UACMP) WITH MICROSCOPIC
Bilirubin Urine: NEGATIVE
Glucose, UA: 150 mg/dL — AB
Hgb urine dipstick: NEGATIVE
Ketones, ur: NEGATIVE mg/dL
Nitrite: POSITIVE — AB
Protein, ur: NEGATIVE mg/dL
Specific Gravity, Urine: 1.009 (ref 1.005–1.030)
pH: 5 (ref 5.0–8.0)

## 2023-05-14 LAB — CBC
HCT: 25.3 % — ABNORMAL LOW (ref 36.0–46.0)
Hemoglobin: 8.3 g/dL — ABNORMAL LOW (ref 12.0–15.0)
MCH: 28.9 pg (ref 26.0–34.0)
MCHC: 32.8 g/dL (ref 30.0–36.0)
MCV: 88.2 fL (ref 80.0–100.0)
Platelets: 439 10*3/uL — ABNORMAL HIGH (ref 150–400)
RBC: 2.87 MIL/uL — ABNORMAL LOW (ref 3.87–5.11)
RDW: 15.4 % (ref 11.5–15.5)
WBC: 18.4 10*3/uL — ABNORMAL HIGH (ref 4.0–10.5)
nRBC: 0 % (ref 0.0–0.2)

## 2023-05-14 LAB — GLUCOSE, CAPILLARY
Glucose-Capillary: 169 mg/dL — ABNORMAL HIGH (ref 70–99)
Glucose-Capillary: 294 mg/dL — ABNORMAL HIGH (ref 70–99)
Glucose-Capillary: 205 mg/dL — ABNORMAL HIGH (ref 70–99)
Glucose-Capillary: 340 mg/dL — ABNORMAL HIGH (ref 70–99)

## 2023-05-14 LAB — MAGNESIUM: Magnesium: 1.8 mg/dL (ref 1.7–2.4)

## 2023-05-14 LAB — PHOSPHORUS: Phosphorus: 1.4 mg/dL — ABNORMAL LOW (ref 2.5–4.6)

## 2023-05-14 LAB — C-REACTIVE PROTEIN: CRP: 1.7 mg/dL — ABNORMAL HIGH (ref <1.0)

## 2023-05-14 LAB — CREATININE, URINE, RANDOM: Creatinine, Urine: 34 mg/dL

## 2023-05-14 MED ORDER — BUDESONIDE 0.5 MG/2ML IN SUSP
0.5000 mg | Freq: Two times a day (BID) | RESPIRATORY_TRACT | Status: DC
  Administered 2023-05-14 – 2023-05-16 (×3): 0.5 mg via RESPIRATORY_TRACT
  Filled 2023-05-14 (×3): qty 2

## 2023-05-14 MED ORDER — SODIUM PHOSPHATES 45 MMOLE/15ML IV SOLN
30.0000 mmol | Freq: Once | INTRAVENOUS | Status: AC
  Administered 2023-05-14: 30 mmol via INTRAVENOUS
  Filled 2023-05-14: qty 10

## 2023-05-14 MED ORDER — ADULT MULTIVITAMIN W/MINERALS CH
1.0000 | ORAL_TABLET | Freq: Every day | ORAL | Status: DC
  Administered 2023-05-14 – 2023-05-16 (×3): 1 via ORAL
  Filled 2023-05-14 (×3): qty 1

## 2023-05-14 MED ORDER — FLUTICASONE PROPIONATE 50 MCG/ACT NA SUSP
2.0000 | Freq: Every day | NASAL | Status: DC
  Administered 2023-05-14 – 2023-05-16 (×3): 2 via NASAL
  Filled 2023-05-14: qty 16

## 2023-05-14 MED ORDER — IPRATROPIUM-ALBUTEROL 20-100 MCG/ACT IN AERS
1.0000 | INHALATION_SPRAY | Freq: Four times a day (QID) | RESPIRATORY_TRACT | Status: DC
  Filled 2023-05-14: qty 4

## 2023-05-14 MED ORDER — IPRATROPIUM-ALBUTEROL 0.5-2.5 (3) MG/3ML IN SOLN
3.0000 mL | Freq: Three times a day (TID) | RESPIRATORY_TRACT | Status: DC
  Administered 2023-05-14: 3 mL via RESPIRATORY_TRACT

## 2023-05-14 MED ORDER — LORATADINE 10 MG PO TABS
10.0000 mg | ORAL_TABLET | Freq: Every day | ORAL | Status: DC
  Administered 2023-05-14 – 2023-05-16 (×3): 10 mg via ORAL
  Filled 2023-05-14 (×3): qty 1

## 2023-05-14 MED ORDER — SODIUM CHLORIDE 0.9 % IV SOLN: INTRAVENOUS | Status: DC

## 2023-05-14 MED ORDER — GABAPENTIN 300 MG PO CAPS
300.0000 mg | ORAL_CAPSULE | Freq: Two times a day (BID) | ORAL | Status: DC
  Administered 2023-05-14 – 2023-05-16 (×4): 300 mg via ORAL
  Filled 2023-05-14 (×4): qty 1

## 2023-05-14 MED ORDER — IPRATROPIUM-ALBUTEROL 20-100 MCG/ACT IN AERS: 1.0000 | INHALATION_SPRAY | Freq: Four times a day (QID) | RESPIRATORY_TRACT | Status: DC

## 2023-05-14 MED ORDER — GLUCERNA SHAKE PO LIQD
237.0000 mL | Freq: Three times a day (TID) | ORAL | Status: DC
  Administered 2023-05-14 – 2023-05-16 (×6): 237 mL via ORAL

## 2023-05-14 MED ORDER — PANTOPRAZOLE SODIUM 40 MG PO TBEC
40.0000 mg | DELAYED_RELEASE_TABLET | Freq: Every day | ORAL | Status: DC
  Administered 2023-05-14 – 2023-05-16 (×3): 40 mg via ORAL
  Filled 2023-05-14 (×3): qty 1

## 2023-05-14 NOTE — Progress Notes (Signed)
PROGRESS NOTE    Maria Arroyo  MWN:[id number] DOB: 01-Oct-1950 DOA: 05/12/2023 PCP: Eartha Inch, MD    Chief Complaint  Patient presents with   Shortness of Breath    Brief Narrative:  Patient 73 year old female history of chronic hypoxic respiratory failure on 3 L nasal cannula at home, COPD, paroxysmal A-fib, chronic diastolic CHF, hypertension, hyperlipidemia, type 2 diabetes, stage IIIb CKD, squamous cell cancer of the right lung status post lobectomy 2020 presented to the ED with complaints of shortness of breath.  Patient noted to be in acute on chronic hypoxic and hypercarbic respiratory failure.  Workup positive for COVID-19 infection, patient also noted to be in an acute COPD exacerbation.   Assessment & Plan:   Principal Problem:   Acute on chronic respiratory failure with hypoxia and hypercapnia (HCC) Active Problems:   COPD with acute exacerbation (HCC)   COVID-19 virus infection   Hyperlipidemia   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)   AF (paroxysmal atrial fibrillation) (HCC)   COPD (chronic obstructive pulmonary disease) (HCC)   Controlled type 2 diabetes mellitus without complication, without long-term current use of insulin (HCC)   AKI (acute kidney injury) (HCC)   Hypokalemia   Hypophosphatemia  #1 acute on chronic hypoxic and hypercarbic respiratory failure secondary to COVID-19 infection and acute COPD exacerbation in the setting of a history of SCC of right lung status post lobectomy -Patient with history of chronic hypoxic respiratory failure on 3 L nasal cannula at baseline presenting with worsening shortness of breath over the past several days noted to be in acute COPD exacerbation. -Respiratory viral panel positive for COVID-19 per PCR. -Influenza A and B PCR negative.  RSV by PCR negative. -CRP elevated at 1.7. -O2 requirements improving. -Sputum Gram stain and cultures pending. -Continue Brovana nebs twice daily,  increase Pulmicort to 0.5 mg nebs twice daily.  Placed on scheduled DuoNebs. -Continue prednisone 40 mg daily. -Continue azithromycin. -Start Flonase, Claritin, PPI. -Albuterol nebs as needed.  2.  Hypokalemia -Patient noted to be on diuretics of Demadex and HCTZ prior to admission. -Magnesium noted at 1.8 today. -Potassium repleted currently at 4.1 today. -Follow.  3.  Hypophosphatemia -Phosphorus at 1.4. -Sodium phosphate 30 mmol IV x 1. -Repeat labs in the AM.  4.  Paroxysmal A-fib/chronic diastolic CHF -Patient with history of documented diastolic CHF. -2D echo from 08/26/2018 with a EF of 55 to 60%, diastolic dysfunction, moderately reduced right ventricular systolic function. -Patient noted to be on bisoprolol 2.5 mg daily, torsemide 20 mg daily, amiodarone 200 mg daily and on Eliquis for anticoagulation. -Continue amiodarone, bisoprolol, Isordi. -Torsemide on hold echo likely resume in the next 24 to 48 hours. -Continue Eliquis for anticoagulation.  5.  Hypertension -Patient noted to be on continue bisoprolol, HCTZ, Demadex prior to admission. -HCTZ and Demadex on hold. -Continue bisoprolol.  6.  Hyperlipidemia -Continue Crestor.  7.  Diabetes mellitus type 2 -Patient not on any diabetic medications prior to admission. -Hemoglobin A1c 7.0 -CBG 169 this morning. -Patient on steroids. -Continue Semglee 18 units daily, SSI.  8.  Acute kidney injury on CKD stage III -Baseline creatinine 1.6-1.8. -Diuretics on hold. -Check a urine sodium, urine creatinine, UA. -Gentle hydration for the next 24 hours. -Monitor urine output. -Follow.    DVT prophylaxis: Eliquis Code Status: Full Family Communication: No family at bedside. Disposition: Home with home health when clinically improved and back to baseline.  Status is: Inpatient Remains inpatient appropriate because: Severity of  illness   Consultants:  None  Procedures:  Chest x-ray  05/12/2023   Antimicrobials:  Anti-infectives (From admission, onward)    Start     Dose/Rate Route Frequency Ordered Stop   05/14/23 1200  azithromycin (ZITHROMAX) tablet 500 mg        500 mg Oral Daily 05/13/23 1431 05/14/23 1241   05/13/23 1200  azithromycin (ZITHROMAX) 500 mg in sodium chloride 0.9 % 250 mL IVPB  Status:  Discontinued        500 mg 250 mL/hr over 60 Minutes Intravenous Every 24 hours 05/12/23 1627 05/13/23 1431   05/12/23 1430  azithromycin (ZITHROMAX) 500 mg in sodium chloride 0.9 % 250 mL IVPB        500 mg 250 mL/hr over 60 Minutes Intravenous  Once 05/12/23 1427 05/12/23 1602         Subjective: Patient sleeping deeply.  Objective: Vitals:   05/14/23 0500 05/14/23 0855 05/14/23 1150 05/14/23 1633  BP:  (!) 145/56 (!) 145/64   Pulse:  (!) 53 60   Resp:  15 19   Temp:  98.1 F (36.7 C) 98.3 F (36.8 C) 98 F (36.7 C)  TempSrc:  Oral Oral Oral  SpO2:  99% 99%   Weight: 68.2 kg     Height:        Intake/Output Summary (Last 24 hours) at 05/14/2023 1658 Last data filed at 05/14/2023 1500 Gross per 24 hour  Intake 3099.89 ml  Output 2250 ml  Net 849.89 ml   Filed Weights   05/12/23 1128 05/14/23 0500  Weight: 74 kg 68.2 kg    Examination:  General exam: NAD Respiratory system: Some coarse breath sounds anterior lung fields.  Minimal wheezing anterior lung fields.   Cardiovascular system: S1 & S2 heard, RRR. No JVD, murmurs, rubs, gallops or clicks. No pedal edema. Gastrointestinal system: Abdomen is nondistended, soft and nontender. No organomegaly or masses felt. Normal bowel sounds heard. Central nervous system: Patient sleeping deeply.  Moving extremities spontaneously.  No focal neurological deficit.  Extremities: Symmetric 5 x 5 power. Skin: No rashes, lesions or ulcers Psychiatry: Judgement and insight unable to assess as patient is asleep.. Mood & affect unable to assess..     Data Reviewed: I have personally reviewed following  labs and imaging studies  CBC: Recent Labs  Lab 05/12/23 1144 05/12/23 1349 05/13/23 0425 05/14/23 0337  WBC 15.7*  --  18.0* 18.4*  HGB 10.5* 11.2* 9.3* 8.3*  HCT 33.1* 33.0* 29.3* 25.3*  MCV 88.5  --  88.0 88.2  PLT 421*  --  398 439*    Basic Metabolic Panel: Recent Labs  Lab 05/12/23 1144 05/12/23 1240 05/12/23 1349 05/13/23 0425 05/14/23 0337  NA 133*  --  130* 130* 131*  K 2.7*  --  3.8 3.3* 4.1  CL 79*  --   --  80* 87*  CO2 35*  --   --  35* 34*  GLUCOSE 186*  --   --  345* 242*  BUN 39*  --   --  48* 65*  CREATININE 1.69*  --   --  1.85* 2.24*  CALCIUM 9.4  --   --  9.1 8.2*  MG  --  2.2  --   --  1.8  PHOS  --   --   --   --  1.4*    GFR: Estimated Creatinine Clearance: 20.1 mL/min (A) (by C-G formula based on SCr of 2.24 mg/dL (H)).  Liver Function  Tests: Recent Labs  Lab 05/14/23 0337  AST 31  ALT 36  ALKPHOS 78  BILITOT 0.4  PROT 5.5*  ALBUMIN 2.0*    CBG: Recent Labs  Lab 05/13/23 1744 05/13/23 2124 05/14/23 0645 05/14/23 1149 05/14/23 1634  GLUCAP 299* 254* 169* 294* 205*     Recent Results (from the past 240 hours)  Resp panel by RT-PCR (RSV, Flu A&B, Covid) Anterior Nasal Swab     Status: Abnormal   Collection Time: 05/12/23 11:41 AM   Specimen: Anterior Nasal Swab  Result Value Ref Range Status   SARS Coronavirus 2 by RT PCR POSITIVE (A) NEGATIVE Final   Influenza A by PCR NEGATIVE NEGATIVE Final   Influenza B by PCR NEGATIVE NEGATIVE Final    Comment: (NOTE) The Xpert Xpress SARS-CoV-2/FLU/RSV plus assay is intended as an aid in the diagnosis of influenza from Nasopharyngeal swab specimens and should not be used as a sole basis for treatment. Nasal washings and aspirates are unacceptable for Xpert Xpress SARS-CoV-2/FLU/RSV testing.  Fact Sheet for Patients: BloggerCourse.com  Fact Sheet for Healthcare Providers: SeriousBroker.it  This test is not yet approved or  cleared by the [place] FDA and has been authorized for detection and/or diagnosis of SARS-CoV-2 by FDA under an Emergency Use Authorization (EUA). This EUA will remain in effect (meaning this test can be used) for the duration of the COVID-19 declaration under Section 564(b)(1) of the Act, 21 U.S.C. section 360bbb-3(b)(1), unless the authorization is terminated or revoked.     Resp Syncytial Virus by PCR NEGATIVE NEGATIVE Final    Comment: (NOTE) Fact Sheet for Patients: BloggerCourse.com  Fact Sheet for Healthcare Providers: SeriousBroker.it  This test is not yet approved or cleared by the [place] FDA and has been authorized for detection and/or diagnosis of SARS-CoV-2 by FDA under an Emergency Use Authorization (EUA). This EUA will remain in effect (meaning this test can be used) for the duration of the COVID-19 declaration under Section 564(b)(1) of the Act, 21 U.S.C. section 360bbb-3(b)(1), unless the authorization is terminated or revoked.  Performed at [facility], [address] [place], [place] [address]          Radiology Studies: No results found.      Scheduled Meds:  amiodarone  200 mg Oral Daily   apixaban  5 mg Oral BID   arformoterol  15 mcg Nebulization BID   bisoprolol  2.5 mg Oral Daily   budesonide (PULMICORT) nebulizer solution  0.5 mg Nebulization BID   busPIRone  15 mg Oral BID   feeding supplement (GLUCERNA SHAKE)  237 mL Oral TID BM   fluticasone  2 spray Each Nare Daily   gabapentin  300 mg Oral BID   hydrALAZINE  25 mg Oral Q8H   insulin aspart  0-5 Units Subcutaneous QHS   insulin aspart  0-9 Units Subcutaneous TID WC   insulin glargine-yfgn  18 Units Subcutaneous Daily   Ipratropium-Albuterol  1 puff Inhalation Q6H   isosorbide dinitrate  10 mg Oral TID   loratadine  10 mg Oral Daily   multivitamin with minerals  1 tablet Oral Daily   pantoprazole  40 mg Oral  Q0600   predniSONE  40 mg Oral Q breakfast   rosuvastatin  40 mg Oral Daily   venlafaxine XR  150 mg Oral Daily   Continuous Infusions:  sodium chloride 75 mL/hr at 05/14/23 0955   sodium PHOSPHATE IVPB (in mmol) 30 mmol (05/14/23 1134)  LOS: 1 day    Time spent: 40 minutes    Ramiro Harvest, MD [facility]   To contact the attending provider between 7A-7P or the covering provider during after hours 7P-7A, please log into the web site [email or web address] and access using universal [place] password for that web site. If you do not have the password, please call the hospital operator.  05/14/2023, 4:58 PM

## 2023-05-14 NOTE — Progress Notes (Signed)
Initial Nutrition Assessment  DOCUMENTATION CODES:   Not applicable  INTERVENTION:  Liberalize diet to carb modified Glucerna Shake po TID, each supplement provides 220 kcal and 10 grams of protein MVI with minerals daily  NUTRITION DIAGNOSIS:   Increased nutrient needs related to acute illness, chronic illness (COVID+, COPD) as evidenced by estimated needs.  GOAL:   Patient will meet greater than or equal to 90% of their needs  MONITOR:   PO intake, Supplement acceptance, Diet advancement, Weight trends, Labs  REASON FOR ASSESSMENT:   Consult Assessment of nutrition requirement/status  ASSESSMENT:   Pt admitted with SOB d/t COPD exacerbation and COVID+. PMH significant for chronic hypoxic respiratory failure, COPD, afib, chronic diastolic HF, HTN, HLD controlled DM2, CKD IIIb, SCLC s/p lobectomy 2020.  Remains admitted for respiratory management.   On admission, pt reported to have have decreased oral intake over the past couple days.  RD working remotely. Unable to obtain detailed nutrition related history at this time.  Given increased nutrition needs in the setting of COPD exacerbation and COVID+, pt would benefit from nutrition supplements to promote nutritional adequacy.   Meal completions: 2/14: 100% breakfast  Unfortunately, there is limited weight history on file to review within the last year. If documented weights on file are actual measured weights, pt is noted to have had a weight loss of 8.2% within the last 5 months which is not clinically significant for time frame.   Medications: SSI 0-5 units at bedtime, SSI 0-9 units TID, semglee 18 units daily, prednisone Drips: NaCl @ 4ml/hr NaPhos   Labs:  Sodium 131 BUN 65 Cr 2.24 Phos 1.4 - repletion ordered GFR 23 CBG's 169-299 x24 hours HgbA1c 7.0%   NUTRITION - FOCUSED PHYSICAL EXAM: RD working remotely. Deferred to in person follow up.   Diet Order:   Diet Order             Diet heart  healthy/carb modified Room service appropriate? Yes; Fluid consistency: Thin  Diet effective now                   EDUCATION NEEDS:   No education needs have been identified at this time  Skin:  Skin Assessment: Reviewed RN Assessment  Last BM:  2/11  Height:   Ht Readings from Last 1 Encounters:  05/12/23 5\' 1"  (1.549 m)    Weight:   Wt Readings from Last 1 Encounters:  05/14/23 68.2 kg   BMI:  Body mass index is 28.41 kg/m.  Estimated Nutritional Needs:   Kcal:  1500-1700  Protein:  75-90g  Fluid:  >/=1.5L  Drusilla Kanner, RDN, LDN Clinical Nutrition

## 2023-05-14 NOTE — Discharge Instructions (Signed)

## 2023-05-14 NOTE — Progress Notes (Signed)
Ok to reduce gabapentin to 300mg  PO BID based on renal function per Dr. Janee Morn.   Ulyses Southward, PharmD, BCIDP, AAHIVP, CPP Infectious Disease Pharmacist 05/14/2023 1:02 PM

## 2023-05-14 NOTE — Progress Notes (Signed)
Physical Therapy Treatment Patient Details Name: Maria Arroyo MRN: [id number] DOB: June 10, 1950 Today's Date: 05/14/2023   History of Present Illness Maria Arroyo is a 73 y/o female presented 05/12/23 under observation with SOB for the past several days and found to have an acute COPD exacerbation on arrival.  Her respiratory panel is positive for COVID-19 though fortunately chest x-ray not showing any focal pneumonia. 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 Pt did well with rollator this session; requires intermittent cues for brakes and safety placing pt at supervision. Mod I bed mobility and supervision for sit to stand due to poor eccentric control with sitting and not using hands for assist. Pt O2 sats remained low to mid 90's during gait with 3L o2 which pt is on at home. Due to pt current functional status, home set up and available assistance at home recommending skilled physical therapy services 3x/week in order to address strength, balance and functional mobility to decrease risk for falls, injury and re-hospitalization.       If plan is discharge home, recommend the following: A little help with walking and/or transfers;Assistance with cooking/housework;Assist for transportation;Help with stairs or ramp for entrance     Equipment Recommendations  Rollator (4 wheels)       Precautions / Restrictions Precautions Precautions: Fall Recall of Precautions/Restrictions: Intact Precaution/Restrictions Comments: Covid + Restrictions Weight Bearing Restrictions Per Provider Order: No     Mobility  Bed Mobility Overal bed mobility: Modified Independent Bed Mobility: Supine to Sit, Sit to Supine      Supine to sit: Modified independent (Device/Increase time) Sit to supine: Modified independent (Device/Increase time)        Transfers Overall transfer level: Needs assistance Equipment used: Rollator (4 wheels) Transfers: Sit to/from Stand Sit to Stand: Supervision           General transfer comment: verbal cues for locking brakes and using hands to assist with eccentric control due to quick, unsafe sitting.    Ambulation/Gait Ambulation/Gait assistance: Supervision Gait Distance (Feet): 60 Feet Assistive device: Rollator (4 wheels) Gait Pattern/deviations: Step-through pattern, Decreased step length - right, Decreased step length - left, Decreased stride length Gait velocity: mildly decreased Gait velocity interpretation: 1.31 - 2.62 ft/sec, indicative of limited community ambulator   General Gait Details: supervision for safety; verbal cues for how to use rollator      Balance Overall balance assessment: Mild deficits observed, not formally tested Sitting-balance support: Feet supported, Bilateral upper extremity supported Sitting balance-Leahy Scale: Good     Standing balance support: During functional activity, No upper extremity supported Standing balance-Leahy Scale: Fair        Hotel manager: No apparent difficulties  Cognition Arousal: Alert Behavior During Therapy: WFL for tasks assessed/performed   PT - Cognitive impairments: Safety/Judgement       Following commands: Intact      Cueing Cueing Techniques: Verbal cues     General Comments General comments (skin integrity, edema, etc.): pt tolerated treatment session well O2 sats 95% on 3 L o2 via [place]. Pt states this is higher than when she is at home.      Pertinent Vitals/Pain Pain Assessment Pain Assessment: No/denies pain     PT Goals (current goals can  now be found in the care plan section) Acute Rehab PT Goals Patient Stated Goal: get better and go  home PT Goal Formulation: With patient/family Time For Goal Achievement: 05/27/23 Potential to Achieve Goals: Good Progress towards PT goals: Progressing toward goals    Frequency    Min 1X/week      PT Plan  Decrease frequency       AM-PAC PT "6 Clicks" Mobility   Outcome Measure  Help needed turning from your back to your side while in a flat bed without using bedrails?: None Help needed moving from lying on your back to sitting on the side of a flat bed without using bedrails?: None Help needed moving to and from a bed to a chair (including a wheelchair)?: None Help needed standing up from a chair using your arms (e.g., wheelchair or bedside chair)?: None Help needed to walk in hospital room?: A Little Help needed climbing 3-5 steps with a railing? : A Little 6 Click Score: 22    End of Session Equipment Utilized During Treatment: Gait belt;Oxygen Activity Tolerance: Patient tolerated treatment well Patient left: in bed;with call bell/phone within reach Nurse Communication: Mobility status PT Visit Diagnosis: Unsteadiness on feet (R26.81);Other abnormalities of gait and mobility (R26.89);Difficulty in walking, not elsewhere classified (R26.2)     Time: 1610-9604 PT Time Calculation (min) (ACUTE ONLY): 25 min  Charges:    $Therapeutic Activity: 23-37 mins PT General Charges $$ ACUTE PT VISIT: 1 Visit                     Harrel Carina, DPT, CLT  Acute Rehabilitation Services Office: [phone] (Secure chat preferred)    [name] 05/14/2023, 4:17 PM

## 2023-05-14 NOTE — Plan of Care (Signed)
Plan of care reviewed. Pt has been progressing. She is alert and fully oriented x 4, afebrile, stable hemodynamically, normal respiratory effort, able to rest well with  no complaints overnight. On 3 LPM of O2 NCL. We will continue to monitor.  Problem: Respiratory: Goal: Will maintain a patent airway Outcome: Progressing Goal: Complications related to the disease process, condition or treatment will be avoided or minimized Outcome: Progressing   Problem: Health Behavior/Discharge Planning: Goal: Ability to identify and utilize available resources and services will improve Outcome: Progressing Goal: Ability to manage health-related needs will improve Outcome: Progressing   Problem: Metabolic: Goal: Ability to maintain appropriate glucose levels will improve Outcome: Progressing   Problem: Tissue Perfusion: Goal: Adequacy of tissue perfusion will improve Outcome: Progressing    Filiberto Pinks, RN

## 2023-05-14 NOTE — TOC Initial Note (Signed)
Transition of Care (TOC) - Initial/Assessment Note  Donn Pierini RN, BSN Transitions of Care Unit 4E- RN Case Manager See Treatment Team for direct phone #   Patient Details  Name: Maria Arroyo MRN: [id number] Date of Birth: 1950/08/28  Transition of [facility]) CM/SW Contact:    Darrold Span, RN Phone Number: 05/14/2023, 4:10 PM  Clinical Narrative:                 CM spoke with pt over phone to discuss DME and HH needs.  Per pt she has home 02 w/ [employer] (baseline 3L) pt confirmed she needs rollator and BSC for discharge- would like to use [employer] for DME needs.   Choice offered for [facility] provider- list reviewed w/ pt per Medicare.gov with star ratings provided. Pt has selected [facility] as first choice.   Pt voiced she may go home today, waiting on provider to see.  Family to transport home.   Call made to [employer] liaison for DME needs- Rollator and BSC to be delivered to room.  Per [employer] they are out of rollators- so will defer to another provider- call made to [employer] for DME needs- liaison to follow up for delivery.   Call made to [facility] liaison for [facility] needs - pending orders (RN/PT/OT)- referral has been accepted.      Expected Discharge Plan: Home w Home Health Services Barriers to Discharge: No Barriers Identified   Patient Goals and CMS Choice Patient states their goals for this hospitalization and ongoing recovery are:: return home CMS Medicare.gov Compare Post Acute Care list provided to:: Patient Choice offered to / list presented to : Patient      Expected Discharge Plan and Services   Discharge Planning Services: CM Consult Post Acute Care Choice: Durable Medical Equipment, Home Health Living arrangements for the past 2 months: Single Family Home                 DME Arranged: Bedside commode, Walker rolling with seat DME Agency: [facility] Date DME Agency Contacted: 05/14/23 Time DME Agency Contacted: [phone] Representative spoke with at  DME Agency: Ian Malkin HH Arranged: PT, OT HH Agency: [facility] Date HH Agency Contacted: 05/14/23 Time HH Agency Contacted: 1610 Representative spoke with at [facility] Agency: lynette  Prior Living Arrangements/Services Living arrangements for the past 2 months: Single Family Home Lives with:: Spouse Patient language and need for interpreter reviewed:: Yes Do you feel safe going back to the place where you live?: Yes      Need for Family Participation in Patient Care: Yes (Comment) Care giver support system in place?: Yes (comment)   Criminal Activity/Legal Involvement Pertinent to Current Situation/Hospitalization: No - Comment as needed  Activities of Daily Living      Permission Sought/Granted Permission sought to share information with : Facility Industrial/product designer granted to share information with : Yes, Verbal Permission Granted     Permission granted to share info w AGENCY: HH/DME        Emotional Assessment Appearance:: Appears stated age Attitude/Demeanor/Rapport: Engaged Affect (typically observed): Accepting Orientation: : Oriented to Self, Oriented to Place, Oriented to  Time, Oriented to Situation Alcohol / Substance Use: Not Applicable Psych Involvement: No (comment)  Admission diagnosis:  COPD exacerbation (HCC) [J44.1] COPD with acute exacerbation (HCC) [J44.1] COVID-19 [U07.1] COPD (chronic obstructive pulmonary disease) (HCC) [J44.9] Patient Active Problem List   Diagnosis Date Noted   COPD (chronic obstructive pulmonary disease) (HCC) 05/13/2023   COPD with acute  exacerbation (HCC) 05/12/2023   Branch retinal vein occlusion with macular edema of left eye 12/31/2021   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   Cystoid macular edema of right eye 12/10/2021   Cystoid macular edema, left eye 12/10/2021   Branch retinal vein occlusion of right eye with macular edema 12/10/2021   Posterior vitreous detachment of both eyes 12/10/2021    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 11/07/2018   Acute on chronic diastolic CHF (congestive heart failure) (HCC) 08/31/2018   Symptomatic anemia 08/26/2018   AF (paroxysmal atrial fibrillation) (HCC) 08/26/2018   Recurrent right pleural effusion 08/26/2018   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) 04/28/2018   S/P lobectomy of lung 04/04/2018   Solitary pulmonary nodule 02/04/2018   Neuropathy 03/18/2017   Disturbance of skin sensation 03/10/2013   Cervical spondylosis with myelopathy 03/10/2013   Abnormality of gait 03/10/2013   Blood glucose elevated 11/18/2012   Arthritis 02/02/2011   Chronic pain 02/02/2011   Fibrositis 02/02/2011   Coronary artery disease    COPD GOLD II if use FEV1/VC ratio     Essential hypertension    Hyperlipidemia    Cigarette smoker    Depression    PCP:  Eartha Inch, MD Pharmacy:   [id number] [place], [place] - [address] AT [facility] [address] [place] [address] Phone: [phone] Fax: [phone]     Social Drivers of Health (SDOH) Social History: SDOH Screenings   Food Insecurity: Food Insecurity Present (05/13/2023)  Housing: Unknown (05/13/2023)  Transportation Needs: No Transportation Needs (05/13/2023)  Utilities: Not At Risk (05/13/2023)  Depression (PHQ2-9): Low Risk  (03/17/2018)  Financial Resource Strain: Low Risk  (05/12/2023)   Received from [facility]  Physical Activity: Unknown (03/10/2023)   Received from [facility]  Social Connections: Moderately Isolated (05/13/2023)  Stress: No Stress Concern Present (03/10/2023)   Received from [facility]  Tobacco Use: High Risk (05/12/2023)   SDOH Interventions:     Readmission Risk Interventions     No data to display

## 2023-05-15 DIAGNOSIS — U071 COVID-19: Secondary | ICD-10-CM | POA: Diagnosis not present

## 2023-05-15 DIAGNOSIS — N390 Urinary tract infection, site not specified: Secondary | ICD-10-CM | POA: Clinically undetermined

## 2023-05-15 DIAGNOSIS — J441 Chronic obstructive pulmonary disease with (acute) exacerbation: Secondary | ICD-10-CM | POA: Diagnosis not present

## 2023-05-15 DIAGNOSIS — J9621 Acute and chronic respiratory failure with hypoxia: Secondary | ICD-10-CM | POA: Diagnosis not present

## 2023-05-15 DIAGNOSIS — N179 Acute kidney failure, unspecified: Secondary | ICD-10-CM | POA: Diagnosis not present

## 2023-05-15 DIAGNOSIS — N3000 Acute cystitis without hematuria: Secondary | ICD-10-CM

## 2023-05-15 LAB — CBC WITH DIFFERENTIAL/PLATELET
Abs Immature Granulocytes: 0.37 10*3/uL — ABNORMAL HIGH (ref 0.00–0.07)
Basophils Absolute: 0 10*3/uL (ref 0.0–0.1)
Basophils Relative: 0 %
Eosinophils Absolute: 0 10*3/uL (ref 0.0–0.5)
Eosinophils Relative: 0 %
HCT: 23.7 % — ABNORMAL LOW (ref 36.0–46.0)
Hemoglobin: 7.5 g/dL — ABNORMAL LOW (ref 12.0–15.0)
Immature Granulocytes: 2 %
Lymphocytes Relative: 4 %
Lymphs Abs: 0.6 10*3/uL — ABNORMAL LOW (ref 0.7–4.0)
MCH: 27.8 pg (ref 26.0–34.0)
MCHC: 31.6 g/dL (ref 30.0–36.0)
MCV: 87.8 fL (ref 80.0–100.0)
Monocytes Absolute: 0.9 10*3/uL (ref 0.1–1.0)
Monocytes Relative: 5 %
Neutro Abs: 15.3 10*3/uL — ABNORMAL HIGH (ref 1.7–7.7)
Neutrophils Relative %: 89 %
Platelets: 350 10*3/uL (ref 150–400)
RBC: 2.7 MIL/uL — ABNORMAL LOW (ref 3.87–5.11)
RDW: 15.4 % (ref 11.5–15.5)
WBC: 17.2 10*3/uL — ABNORMAL HIGH (ref 4.0–10.5)
nRBC: 0.2 % (ref 0.0–0.2)

## 2023-05-15 LAB — RENAL FUNCTION PANEL
Albumin: 1.9 g/dL — ABNORMAL LOW (ref 3.5–5.0)
Anion gap: 9 (ref 5–15)
BUN: 50 mg/dL — ABNORMAL HIGH (ref 8–23)
CO2: 31 mmol/L (ref 22–32)
Calcium: 8.3 mg/dL — ABNORMAL LOW (ref 8.9–10.3)
Chloride: 92 mmol/L — ABNORMAL LOW (ref 98–111)
Creatinine, Ser: 1.89 mg/dL — ABNORMAL HIGH (ref 0.44–1.00)
GFR, Estimated: 28 mL/min — ABNORMAL LOW (ref >60)
Glucose, Bld: 224 mg/dL — ABNORMAL HIGH (ref 70–99)
Phosphorus: 2.9 mg/dL (ref 2.5–4.6)
Potassium: 4.1 mmol/L (ref 3.5–5.1)
Sodium: 132 mmol/L — ABNORMAL LOW (ref 135–145)

## 2023-05-15 LAB — GLUCOSE, CAPILLARY
Glucose-Capillary: 136 mg/dL — ABNORMAL HIGH (ref 70–99)
Glucose-Capillary: 302 mg/dL — ABNORMAL HIGH (ref 70–99)
Glucose-Capillary: 309 mg/dL — ABNORMAL HIGH (ref 70–99)
Glucose-Capillary: 177 mg/dL — ABNORMAL HIGH (ref 70–99)

## 2023-05-15 LAB — C-REACTIVE PROTEIN: CRP: 0.9 mg/dL (ref <1.0)

## 2023-05-15 LAB — MAGNESIUM: Magnesium: 1.7 mg/dL (ref 1.7–2.4)

## 2023-05-15 MED ORDER — SODIUM CHLORIDE 0.9 % IV SOLN
2.0000 g | INTRAVENOUS | Status: DC
  Administered 2023-05-15 – 2023-05-16 (×2): 2 g via INTRAVENOUS
  Filled 2023-05-15 (×2): qty 20

## 2023-05-15 MED ORDER — IPRATROPIUM-ALBUTEROL 20-100 MCG/ACT IN AERS
1.0000 | INHALATION_SPRAY | Freq: Two times a day (BID) | RESPIRATORY_TRACT | Status: DC
  Administered 2023-05-16: 1 via RESPIRATORY_TRACT
  Filled 2023-05-15: qty 4

## 2023-05-15 MED ORDER — INSULIN GLARGINE-YFGN 100 UNIT/ML ~~LOC~~ SOLN
20.0000 [IU] | Freq: Every day | SUBCUTANEOUS | Status: DC
  Administered 2023-05-16: 20 [IU] via SUBCUTANEOUS
  Filled 2023-05-15: qty 0.2

## 2023-05-15 MED ORDER — SODIUM CHLORIDE 0.9 % IV SOLN: INTRAVENOUS | Status: DC

## 2023-05-15 MED ORDER — INSULIN GLARGINE-YFGN 100 UNIT/ML ~~LOC~~ SOLN
2.0000 [IU] | Freq: Once | SUBCUTANEOUS | Status: AC
  Administered 2023-05-15: 2 [IU] via SUBCUTANEOUS
  Filled 2023-05-15: qty 0.02

## 2023-05-15 MED ORDER — MAGNESIUM SULFATE 2 GM/50ML IV SOLN
2.0000 g | Freq: Once | INTRAVENOUS | Status: AC
  Administered 2023-05-15: 2 g via INTRAVENOUS
  Filled 2023-05-15: qty 50

## 2023-05-15 NOTE — Progress Notes (Signed)
PROGRESS NOTE    Maria Arroyo  ZOX:[id number] DOB: 05-21-1950 DOA: 05/12/2023 PCP: Eartha Inch, MD    Chief Complaint  Patient presents with   Shortness of Breath    Brief Narrative:  Patient 74 year old female history of chronic hypoxic respiratory failure on 3 L nasal cannula at home, COPD, paroxysmal A-fib, chronic diastolic CHF, hypertension, hyperlipidemia, type 2 diabetes, stage IIIb CKD, squamous cell cancer of the right lung status post lobectomy 2020 presented to the ED with complaints of shortness of breath.  Patient noted to be in acute on chronic hypoxic and hypercarbic respiratory failure.  Workup positive for COVID-19 infection, patient also noted to be in an acute COPD exacerbation.   Assessment & Plan:   Principal Problem:   Acute on chronic respiratory failure with hypoxia and hypercapnia (HCC) Active Problems:   COPD with acute exacerbation (HCC)   COVID-19 virus infection   Hyperlipidemia   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)   AF (paroxysmal atrial fibrillation) (HCC)   COPD (chronic obstructive pulmonary disease) (HCC)   Controlled type 2 diabetes mellitus without complication, without long-term current use of insulin (HCC)   AKI (acute kidney injury) (HCC)   Hypokalemia   Hypophosphatemia   UTI (urinary tract infection)  #1 acute on chronic hypoxic and hypercarbic respiratory failure secondary to COVID-19 infection and acute COPD exacerbation in the setting of a history of SCC of right lung status post lobectomy -Patient with history of chronic hypoxic respiratory failure on 3 L nasal cannula at baseline presenting with worsening shortness of breath over the past several days noted to be in acute COPD exacerbation. -Respiratory viral panel positive for COVID-19 per PCR. -Influenza A and B PCR negative.  RSV by PCR negative. -CRP elevated at 1.7 initially and trending down currently at 0.9.[name] -O2 requirements  improving. -Sputum Gram stain and cultures pending. -Continue Brovana nebs twice daily, increase Pulmicort to 0.5 mg nebs twice daily, scheduled DuoNebs. -Continue prednisone 40 mg daily. -Continue azithromycin, Flonase, Claritin, PPI. -Albuterol nebs as needed..  2.  Hypokalemia -Patient noted to be on diuretics of Demadex and HCTZ prior to admission. -Magnesium noted at 1.7 today. -Potassium repleted currently at 4.1 today. -Follow.  3.  Hypophosphatemia -Repleted, phosphorus at 2.9 this morning. -Repeat labs in the AM.  4.  Paroxysmal A-fib/chronic diastolic CHF -Patient with history of documented diastolic CHF. -2D echo from 08/26/2018 with a EF of 55 to 60%, diastolic dysfunction, moderately reduced right ventricular systolic function. -Patient noted to be on bisoprolol 2.5 mg daily, torsemide 20 mg daily, amiodarone 200 mg daily and on Eliquis for anticoagulation. -Continue amiodarone, bisoprolol, Isordil. -Torsemide on hold and would likely resume on discharge.   -Patient with good urine output.  -Continue Eliquis for anticoagulation.  5.  Hypertension -Patient noted to be on continue bisoprolol, HCTZ, Demadex prior to admission. -HCTZ and Demadex on hold. -Continue bisoprolol.  6.  Hyperlipidemia -Crestor.    7.  Diabetes mellitus type 2 -Patient not on any diabetic medications prior to admission. -Hemoglobin A1c 7.0 -CBG 136 this morning. -Patient on steroids and as such having some bouts of elevated blood glucose levels.. -Increase Semglee to 20 units daily.   -SSI.   8.  Acute kidney injury on CKD stage III -Baseline creatinine 1.6-1.8. -Diuretics on hold. -Urine output of 2.2 L over the past 24 hours. -Renal function improving with gentle hydration.  -Follow.  9.  UTI -Patient with complaints of dysuria. -Urinalysis concerning for  UTI. -Check urine cultures. -IV Rocephin.    DVT prophylaxis: Eliquis Code Status: Full Family Communication: Updated  patient and husband at bedside.   Disposition: Home with home health when clinically improved and back to baseline hopefully in the next 24 hours..  Status is: Inpatient Remains inpatient appropriate because: Severity of illness   Consultants:  None  Procedures:  Chest x-ray 05/12/2023   Antimicrobials:  Anti-infectives (From admission, onward)    Start     Dose/Rate Route Frequency Ordered Stop   05/15/23 0945  cefTRIAXone (ROCEPHIN) 2 g in sodium chloride 0.9 % 100 mL IVPB        2 g 200 mL/hr over 30 Minutes Intravenous Every 24 hours 05/15/23 0851     05/14/23 1200  azithromycin (ZITHROMAX) tablet 500 mg        500 mg Oral Daily 05/13/23 1431 05/14/23 1241   05/13/23 1200  azithromycin (ZITHROMAX) 500 mg in sodium chloride 0.9 % 250 mL IVPB  Status:  Discontinued        500 mg 250 mL/hr over 60 Minutes Intravenous Every 24 hours 05/12/23 1627 05/13/23 1431   05/12/23 1430  azithromycin (ZITHROMAX) 500 mg in sodium chloride 0.9 % 250 mL IVPB        500 mg 250 mL/hr over 60 Minutes Intravenous  Once 05/12/23 1427 05/12/23 1602         Subjective: Sitting up in chair.  Feels shortness of breath is improving and close to her baseline but not at her baseline.  Denies any chest pain.  Complains of dysuria.  Husband at bedside.   Objective: Vitals:   05/15/23 0433 05/15/23 0804 05/15/23 1208 05/15/23 1549  BP:  (!) 157/92 (!) 142/74 (!) 151/67  Pulse: (!) 59 66 64 (!) 58  Resp: 16 (!) 22 20 17   Temp:  97.6 F (36.4 C) 97.8 F (36.6 C) 98.1 F (36.7 C)  TempSrc:  Oral Oral Oral  SpO2: 100% 98% 93% 95%  Weight: 68.7 kg     Height:        Intake/Output Summary (Last 24 hours) at 05/15/2023 1751 Last data filed at 05/15/2023 1030 Gross per 24 hour  Intake 2806.57 ml  Output 701 ml  Net 2105.57 ml   Filed Weights   05/12/23 1128 05/14/23 0500 05/15/23 0433  Weight: 74 kg 68.2 kg 68.7 kg    Examination:  General exam: NAD Respiratory system: Some decreased  breath sounds in the bases.  Minimal wheezing.  No significant rhonchi.  Speaking in full sentences.  Cardiovascular system: Regular rate rhythm no murmurs rubs or gallops.  No JVD.  No pitting lower extremity edema.  Gastrointestinal system: Abdomen is soft, nontender, nondistended, positive bowel sounds.  No rebound.  No guarding.   Central nervous system: Alert and oriented.  Moving extremities spontaneously.  No focal neurological deficits.  Extremities: Symmetric 5 x 5 power. Skin: No rashes, lesions or ulcers Psychiatry: Judgement and insight is normal.  Mood and affect appropriate.     Data Reviewed: I have personally reviewed following labs and imaging studies  CBC: Recent Labs  Lab 05/12/23 1144 05/12/23 1349 05/13/23 0425 05/14/23 0337 05/15/23 0331  WBC 15.7*  --  18.0* 18.4* 17.2*  NEUTROABS  --   --   --   --  15.3*  HGB 10.5* 11.2* 9.3* 8.3* 7.5*  HCT 33.1* 33.0* 29.3* 25.3* 23.7*  MCV 88.5  --  88.0 88.2 87.8  PLT 421*  --  398 439*  350    Basic Metabolic Panel: Recent Labs  Lab 05/12/23 1144 05/12/23 1240 05/12/23 1349 05/13/23 0425 05/14/23 0337 05/15/23 0331  NA 133*  --  130* 130* 131* 132*  K 2.7*  --  3.8 3.3* 4.1 4.1  CL 79*  --   --  80* 87* 92*  CO2 35*  --   --  35* 34* 31  GLUCOSE 186*  --   --  345* 242* 224*  BUN 39*  --   --  48* 65* 50*  CREATININE 1.69*  --   --  1.85* 2.24* 1.89*  CALCIUM 9.4  --   --  9.1 8.2* 8.3*  MG  --  2.2  --   --  1.8 1.7  PHOS  --   --   --   --  1.4* 2.9    GFR: Estimated Creatinine Clearance: 23.9 mL/min (A) (by C-G formula based on SCr of 1.89 mg/dL (H)).  Liver Function Tests: Recent Labs  Lab 05/14/23 0337 05/15/23 0331  AST 31  --   ALT 36  --   ALKPHOS 78  --   BILITOT 0.4  --   PROT 5.5*  --   ALBUMIN 2.0* 1.9*    CBG: Recent Labs  Lab 05/14/23 1634 05/14/23 2144 05/15/23 0610 05/15/23 1205 05/15/23 1636  GLUCAP 205* 340* 136* 302* 309*     Recent Results (from the past 240  hours)  Resp panel by RT-PCR (RSV, Flu A&B, Covid) Anterior Nasal Swab     Status: Abnormal   Collection Time: 05/12/23 11:41 AM   Specimen: Anterior Nasal Swab  Result Value Ref Range Status   SARS Coronavirus 2 by RT PCR POSITIVE (A) NEGATIVE Final   Influenza A by PCR NEGATIVE NEGATIVE Final   Influenza B by PCR NEGATIVE NEGATIVE Final    Comment: (NOTE) The Xpert Xpress SARS-CoV-2/FLU/RSV plus assay is intended as an aid in the diagnosis of influenza from Nasopharyngeal swab specimens and should not be used as a sole basis for treatment. Nasal washings and aspirates are unacceptable for Xpert Xpress SARS-CoV-2/FLU/RSV testing.  Fact Sheet for Patients: BloggerCourse.com  Fact Sheet for Healthcare Providers: SeriousBroker.it  This test is not yet approved or cleared by the [place] FDA and has been authorized for detection and/or diagnosis of SARS-CoV-2 by FDA under an Emergency Use Authorization (EUA). This EUA will remain in effect (meaning this test can be used) for the duration of the COVID-19 declaration under Section 564(b)(1) of the Act, 21 U.S.C. section 360bbb-3(b)(1), unless the authorization is terminated or revoked.     Resp Syncytial Virus by PCR NEGATIVE NEGATIVE Final    Comment: (NOTE) Fact Sheet for Patients: BloggerCourse.com  Fact Sheet for Healthcare Providers: SeriousBroker.it  This test is not yet approved or cleared by the [place] FDA and has been authorized for detection and/or diagnosis of SARS-CoV-2 by FDA under an Emergency Use Authorization (EUA). This EUA will remain in effect (meaning this test can be used) for the duration of the COVID-19 declaration under Section 564(b)(1) of the Act, 21 U.S.C. section 360bbb-3(b)(1), unless the authorization is terminated or revoked.  Performed at [facility], [address],  [place], [place] [address]          Radiology Studies: No results found.      Scheduled Meds:  amiodarone  200 mg Oral Daily   apixaban  5 mg Oral BID   arformoterol  15 mcg Nebulization BID  bisoprolol  2.5 mg Oral Daily   budesonide (PULMICORT) nebulizer solution  0.5 mg Nebulization BID   busPIRone  15 mg Oral BID   feeding supplement (GLUCERNA SHAKE)  237 mL Oral TID BM   fluticasone  2 spray Each Nare Daily   gabapentin  300 mg Oral BID   hydrALAZINE  25 mg Oral Q8H   insulin aspart  0-5 Units Subcutaneous QHS   insulin aspart  0-9 Units Subcutaneous TID WC   insulin glargine-yfgn  18 Units Subcutaneous Daily   Ipratropium-Albuterol  1 puff Inhalation BID   isosorbide dinitrate  10 mg Oral TID   loratadine  10 mg Oral Daily   multivitamin with minerals  1 tablet Oral Daily   pantoprazole  40 mg Oral Q0600   predniSONE  40 mg Oral Q breakfast   rosuvastatin  40 mg Oral Daily   venlafaxine XR  150 mg Oral Daily   Continuous Infusions:  sodium chloride 75 mL/hr at 05/15/23 1012   cefTRIAXone (ROCEPHIN)  IV 2 g (05/15/23 1014)     LOS: 2 days    Time spent: 40 minutes    Ramiro Harvest, MD [facility]   To contact the attending provider between 7A-7P or the covering provider during after hours 7P-7A, please log into the web site [email or web address] and access using universal [place] password for that web site. If you do not have the password, please call the hospital operator.  05/15/2023, 5:51 PM

## 2023-05-16 DIAGNOSIS — J449 Chronic obstructive pulmonary disease, unspecified: Secondary | ICD-10-CM

## 2023-05-16 DIAGNOSIS — U071 COVID-19: Secondary | ICD-10-CM | POA: Diagnosis not present

## 2023-05-16 DIAGNOSIS — E876 Hypokalemia: Secondary | ICD-10-CM | POA: Diagnosis not present

## 2023-05-16 DIAGNOSIS — N179 Acute kidney failure, unspecified: Secondary | ICD-10-CM | POA: Diagnosis not present

## 2023-05-16 DIAGNOSIS — J9621 Acute and chronic respiratory failure with hypoxia: Secondary | ICD-10-CM | POA: Diagnosis not present

## 2023-05-16 LAB — CBC WITH DIFFERENTIAL/PLATELET
Abs Immature Granulocytes: 0.32 10*3/uL — ABNORMAL HIGH (ref 0.00–0.07)
Basophils Absolute: 0 10*3/uL (ref 0.0–0.1)
Basophils Relative: 0 %
Eosinophils Absolute: 0 10*3/uL (ref 0.0–0.5)
Eosinophils Relative: 0 %
HCT: 25 % — ABNORMAL LOW (ref 36.0–46.0)
Hemoglobin: 7.9 g/dL — ABNORMAL LOW (ref 12.0–15.0)
Immature Granulocytes: 2 %
Lymphocytes Relative: 4 %
Lymphs Abs: 0.7 10*3/uL (ref 0.7–4.0)
MCH: 28.2 pg (ref 26.0–34.0)
MCHC: 31.6 g/dL (ref 30.0–36.0)
MCV: 89.3 fL (ref 80.0–100.0)
Monocytes Absolute: 0.9 10*3/uL (ref 0.1–1.0)
Monocytes Relative: 5 %
Neutro Abs: 14.9 10*3/uL — ABNORMAL HIGH (ref 1.7–7.7)
Neutrophils Relative %: 89 %
Platelets: 374 10*3/uL (ref 150–400)
RBC: 2.8 MIL/uL — ABNORMAL LOW (ref 3.87–5.11)
RDW: 15.4 % (ref 11.5–15.5)
WBC: 16.8 10*3/uL — ABNORMAL HIGH (ref 4.0–10.5)
nRBC: 0 % (ref 0.0–0.2)

## 2023-05-16 LAB — MAGNESIUM: Magnesium: 2.2 mg/dL (ref 1.7–2.4)

## 2023-05-16 LAB — RENAL FUNCTION PANEL
Albumin: 2 g/dL — ABNORMAL LOW (ref 3.5–5.0)
Anion gap: 12 (ref 5–15)
BUN: 49 mg/dL — ABNORMAL HIGH (ref 8–23)
CO2: 31 mmol/L (ref 22–32)
Calcium: 8.9 mg/dL (ref 8.9–10.3)
Chloride: 92 mmol/L — ABNORMAL LOW (ref 98–111)
Creatinine, Ser: 1.87 mg/dL — ABNORMAL HIGH (ref 0.44–1.00)
GFR, Estimated: 28 mL/min — ABNORMAL LOW (ref >60)
Glucose, Bld: 167 mg/dL — ABNORMAL HIGH (ref 70–99)
Phosphorus: 2.5 mg/dL (ref 2.5–4.6)
Potassium: 4.3 mmol/L (ref 3.5–5.1)
Sodium: 135 mmol/L (ref 135–145)

## 2023-05-16 LAB — GLUCOSE, CAPILLARY
Glucose-Capillary: 120 mg/dL — ABNORMAL HIGH (ref 70–99)
Glucose-Capillary: 183 mg/dL — ABNORMAL HIGH (ref 70–99)

## 2023-05-16 MED ORDER — AMOXICILLIN-POT CLAVULANATE 500-125 MG PO TABS: 1.0000 | ORAL_TABLET | Freq: Two times a day (BID) | ORAL | 0 refills | Status: AC

## 2023-05-16 MED ORDER — ISOSORBIDE DINITRATE 10 MG PO TABS: 10.0000 mg | ORAL_TABLET | Freq: Three times a day (TID) | ORAL | 1 refills | Status: DC

## 2023-05-16 MED ORDER — LORATADINE 10 MG PO TABS: 10.0000 mg | ORAL_TABLET | Freq: Every day | ORAL | 0 refills | Status: DC

## 2023-05-16 MED ORDER — HYDRALAZINE HCL 25 MG PO TABS: 25.0000 mg | ORAL_TABLET | Freq: Three times a day (TID) | ORAL | 1 refills | Status: DC

## 2023-05-16 MED ORDER — IPRATROPIUM-ALBUTEROL 20-100 MCG/ACT IN AERS: 1.0000 | INHALATION_SPRAY | Freq: Two times a day (BID) | RESPIRATORY_TRACT | Status: DC

## 2023-05-16 MED ORDER — FLUTICASONE PROPIONATE 50 MCG/ACT NA SUSP: 2.0000 | Freq: Every day | NASAL | 0 refills | Status: DC

## 2023-05-16 MED ORDER — GABAPENTIN 300 MG PO CAPS: 300.0000 mg | ORAL_CAPSULE | Freq: Two times a day (BID) | ORAL | 1 refills | Status: DC

## 2023-05-16 MED ORDER — PREDNISONE 20 MG PO TABS
20.0000 mg | ORAL_TABLET | Freq: Every day | ORAL | Status: DC
Start: 2023-05-17 — End: 2023-05-16

## 2023-05-16 MED ORDER — PREDNISONE 20 MG PO TABS: 20.0000 mg | ORAL_TABLET | Freq: Every day | ORAL | 0 refills | Status: AC

## 2023-05-16 MED ORDER — PANTOPRAZOLE SODIUM 40 MG PO TBEC
40.0000 mg | DELAYED_RELEASE_TABLET | Freq: Every day | ORAL | 0 refills | Status: DC
Start: 2023-05-17 — End: 2023-08-11

## 2023-05-16 NOTE — Progress Notes (Signed)
Patient given discharge instructions. Husband and niece present. PIV removed. Telemetry box removed, CCMD notified. Walker and BSC packed along with patients belongings. Patient applied home O2 for transportation need. Patient taken to vehicle in wheelchair by staff.  Kenard Gower, RN

## 2023-05-16 NOTE — Discharge Summary (Signed)
Physician Discharge Summary  Maria Arroyo VWU:[id number] DOB: Jun 05, 1950 DOA: 05/12/2023  PCP: Eartha Inch, MD  Admit date: 05/12/2023 Discharge date: 05/16/2023  Time spent: 60 minutes  Recommendations for Outpatient Follow-up:  Follow-up with Eartha Inch, MD in 2 weeks.  On follow-up patient will need a basic metabolic profile, phosphorus level done to follow-up on electrolytes and renal function.  Patient's acute COPD exacerbation and COVID-19 will need to be followed up upon.  Urine cultures will need to be followed up upon which are pending at time of discharge. Patient be discharged with home health services.   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  Diet recommendation: Heart healthy  Filed Weights   05/12/23 1128 05/14/23 0500 05/15/23 0433  Weight: 74 kg 68.2 kg 68.7 kg    History of present illness:  HPI per Dr. Allen Derry Maria Arroyo is a 73 y.o. 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   Patient reports that she has been feeling unwell for the past week  or so.  She has had decreased oral intake over the past couple of days as well.  She notes that she has been feeling very warm and wants to eat ice to cool down.  Per husband, patient has recently had a worsening cough with associated fatigue, malaise, and decreased appetite.  She has also been having increase of work of breathing over the past several days.  Husband does report having increased her home oxygen to 4 L from 3L due to patient's complaints of not being able to breathe (did not check oxygen level).  Husband took patient to her PCP today and patient was subsequently advised to come to the ED for further evaluation.  EMS arrived and brought patient to the ED, did provide a dose of DuoNebs and a dose of Solu-Medrol en route.   ED course: Vital signs stable aside from elevated BP.  Saturating 99% on 3 L nasal cannula.  CBC with leukocytosis to 15.7, chronic anemia at her baseline.  BMP with significant hypokalemia, metabolic alkalosis, mild hyponatremia, hyperglycemia to 186, creatinine 1.69 (baseline appears to be around 1.6-1.8).  Respiratory panel positive for COVID. EKG with NSR and LBBB (noted on prior EKG as well).  VBG showing mild hypercarbia, otherwise normal pH.  Chest x-ray showing trace bilateral pleural effusions otherwise no findings concerning for consolidation.  She was given DuoNebs x 2, potassium supplementation, and a dose of Zofran in the ED.  [facility] hospitalist asked to evaluate patient for admission.  Hospital Course:  #1 acute on chronic hypoxic and hypercarbic respiratory failure secondary to COVID-19 infection and acute COPD exacerbation in the setting of a  history of SCC of right lung status post lobectomy -Patient with history of chronic hypoxic respiratory failure on 3 L nasal cannula at baseline presenting with worsening shortness of breath over the past several days noted to be in acute COPD exacerbation. -Respiratory viral panel positive for COVID-19 per PCR. -Influenza  A and B PCR negative.  RSV by PCR negative. -CRP elevated at 1.7 initially and trended down to 0.9 by day of discharge.   -O2 requirements improved and patient was back to baseline home O2 by day of discharge.   -Sputum Gram stain and cultures obtained with no growth to date. -Patient maintained on Brovana nebs twice daily, increase Pulmicort to 0.5 mg nebs twice daily, scheduled DuoNebs. -Patient also placed on the steroids taper and patient be discharged on prednisone 20 mg daily x 3 more days. -Patient received a course of azithromycin, maintained on Flonase Claritin and PPI as well as albuterol nebs as needed. -Patient improved clinically and will be discharged in stable and improved condition.   2.  Hypokalemia -Patient noted to be on diuretics of Demadex and HCTZ prior to admission. -Potassium repleted, HCTZ held and will not be resumed on discharge.   -Close outpatient follow-up with PCP.   3.  Hypophosphatemia -Repleted, during the hospitalization.    4.  Paroxysmal A-fib/chronic diastolic CHF -Patient with history of documented diastolic CHF. -2D echo from 08/26/2018 with a EF of 55 to 60%, diastolic dysfunction, moderately reduced right ventricular systolic function. -Patient noted to be on bisoprolol 2.5 mg daily, torsemide 20 mg daily, amiodarone 200 mg daily and on Eliquis for anticoagulation. -During hospitalization patient maintained on amiodarone, bisoprolol, Isordil, hydralazine. -Torsemide was held and will be resumed 3 days postdischarge.   -Patient maintained on Eliquis for anticoagulation.   -Outpatient follow-up.     5.  Hypertension -Patient noted to be on bisoprolol, HCTZ, Demadex prior to admission. -HCTZ and Demadex were held during the hospitalization and patient maintained on hydralazine as well as Isordil.   -Patient be discharged on bisoprolol, hydralazine, Isordil.  Demadex to be resumed 3 days postdischarge.   -HCTZ will be discontinued on discharge.    -Close outpatient follow-up with PCP.   6.  Hyperlipidemia -Patient maintained on home regimen Crestor.     7.  Diabetes mellitus type 2 -Patient not on any diabetic medications prior to admission. -Hemoglobin A1c 7.0 -Patient on steroids and as such having some bouts of elevated blood glucose levels during the hospitalization. -Patient maintained on Semglee 20 units daily as well as SSI during the hospitalization. -Outpatient follow-up with PCP.   8.  Acute kidney injury on CKD stage III -Baseline creatinine 1.6-1.8. -Diuretics held during the hospitalization.   -Renal function improved with gentle hydration, patient with good urine output.   -Renal function was close to baseline by day of discharge with a creatinine of 1.87.   -Diuretics will be resumed 3 days postdischarge, outpatient follow-up with PCP.   9.  UTI -Patient with complaints of dysuria. -Urinalysis concerning for UTI. -Urine cultures obtained with preliminary results of > 100,000 colonies of GNR.   -Patient placed on IV Rocephin and received 2 doses of IV Rocephin during the hospitalization and will be discharged on 3 more days of Augmentin to complete a course of antibiotic treatment.   -Outpatient follow-up with PCP.     Procedures: Chest x-ray 05/12/2023  Consultations: None  Discharge Exam: Vitals:   05/16/23 1234 05/16/23 1344  BP: (!) 171/77 (!) 148/58  Pulse:  61 61  Resp: 18 17  Temp: 97.9 F (36.6 C)   SpO2: 94% 97%    General: NAD Cardiovascular: RRR no murmurs rubs or gallops.  No JVD.  No lower extremity edema. Respiratory: Clear to auscultation bilaterally.  No wheezes, no crackles, no rhonchi.  Fair air movement.  Speaking in full sentences.  Discharge Instructions   Discharge Instructions     Diet - low sodium heart healthy   Complete by: As directed    Increase activity slowly   Complete by: As directed       Allergies as of 05/16/2023       Reactions   Ace Inhibitors  Other (See Comments)   Worsening renal function   Codeine Nausea Only   Codeine-guaifenesin [guaifenesin-codeine] Nausea Only, Other (See Comments)   Some nausea--but ok with percocet        Medication List     PAUSE taking these medications    torsemide 20 MG tablet Wait to take this until: May 19, 2023 Commonly known as: DEMADEX Take 1 tablet (20 mg total) by mouth daily.       STOP taking these medications    gabapentin 600 MG tablet Commonly known as: NEURONTIN Replaced by: gabapentin 300 MG capsule   hydrochlorothiazide 12.5 MG capsule Commonly known as: MICROZIDE       TAKE these medications    albuterol 108 (90 Base) MCG/ACT inhaler Commonly known as: VENTOLIN HFA Inhale 2 puffs into the lungs every 6 (six) hours as needed for shortness of breath or wheezing.   allopurinol 100 MG tablet Commonly known as: ZYLOPRIM Take 100 mg by mouth daily.   amiodarone 200 MG tablet Commonly known as: PACERONE Take 1 tablet (200 mg total) by mouth daily.   amoxicillin-clavulanate 500-125 MG tablet Commonly known as: Augmentin Take 1 tablet by mouth 2 (two) times daily for 3 days. Start taking on: May 17, 2023   apixaban 5 MG Tabs tablet Commonly known as: ELIQUIS Take 1 tablet (5 mg total) by mouth 2 (two) times daily.   bisoprolol 5 MG tablet Commonly known as: ZEBETA Take 0.5 tablets (2.5 mg total) by mouth daily.   busPIRone 15 MG tablet Commonly known as: BUSPAR Take 15 mg by mouth 2 (two) times daily.   CALCIUM 600 + D PO Take 1 tablet by mouth daily.   carisoprodol 350 MG tablet Commonly known as: SOMA Take 1 tablet by mouth 4 (four) times daily as needed for muscle spasms.   diazepam 5 MG tablet Commonly known as: VALIUM Take 5 mg by mouth daily.   fluticasone 50 MCG/ACT nasal spray Commonly known as: FLONASE Place 2 sprays into both nostrils daily for 7 days. Start taking on: May 17, 2023   gabapentin 300 MG  capsule Commonly known as: NEURONTIN Take 1 capsule (300 mg total) by mouth 2 (two) times daily. Replaces: gabapentin 600 MG tablet   hydrALAZINE 25 MG tablet Commonly known as: APRESOLINE Take 1 tablet (25 mg total) by mouth every 8 (eight) hours.   ibuprofen 200 MG tablet Commonly known as: ADVIL Take 200 mg by mouth every 6 (six) hours as needed for headache.   Ipratropium-Albuterol 20-100 MCG/ACT Aers respimat Commonly known as: COMBIVENT Inhale 1 puff into the lungs 2 (two) times daily for 7 days.   isosorbide dinitrate 10 MG tablet Commonly known as: ISORDIL Take 1 tablet (10 mg total) by mouth 3 (three) times daily.   loratadine 10 MG tablet Commonly known as:  CLARITIN Take 1 tablet (10 mg total) by mouth daily. Start taking on: May 17, 2023   multivitamin with minerals Tabs tablet Take 1 tablet by mouth daily.   nitroGLYCERIN 0.4 MG SL tablet Commonly known as: NITROSTAT PLACE 1 TABLET UNDER THE TONGUE EVERY 5 MINUTES AS NEEDED CHEST PAIN. What changed: See the new instructions.   oxyCODONE-acetaminophen 10-325 MG tablet Commonly known as: PERCOCET Take 1 tablet by mouth 4 (four) times daily as needed for pain.   pantoprazole 40 MG tablet Commonly known as: PROTONIX Take 1 tablet (40 mg total) by mouth daily at 6 (six) AM. Start taking on: May 17, 2023   predniSONE 20 MG tablet Commonly known as: DELTASONE Take 1 tablet (20 mg total) by mouth daily before breakfast for 3 days. Start taking on: May 17, 2023   rosuvastatin 40 MG tablet Commonly known as: CRESTOR TAKE 1 TABLET BY MOUTH DAILY   traZODone 50 MG tablet Commonly known as: DESYREL Take 50 mg by mouth at bedtime as needed for sleep.   valACYclovir 1000 MG tablet Commonly known as: VALTREX Take 500 mg by mouth daily as needed (for outbreaks).   venlafaxine XR 150 MG 24 hr capsule Commonly known as: EFFEXOR-XR Take 150 mg by mouth daily.   VITAMIN C PO Take 1,000 mg by  mouth daily.   Vitamin D 50 MCG (2000 UT) tablet Take 4,000 Units by mouth daily.               Durable Medical Equipment  (From admission, onward)           Start     Ordered   05/14/23 1606  For home use only DME 4 wheeled rolling walker with seat  Once       Question:  Patient needs a walker to treat with the following condition  Answer:  Physical deconditioning   05/14/23 1605   05/14/23 1606  For home use only DME Bedside commode  Once       Question:  Patient needs a bedside commode to treat with the following condition  Answer:  Physical deconditioning   05/14/23 1605   05/13/23 1158  For home use only DME 4 wheeled rolling walker with seat  Once       Question:  Patient needs a walker to treat with the following condition  Answer:  Difficulty walking   05/13/23 1157           Allergies  Allergen Reactions   Ace Inhibitors Other (See Comments)    Worsening renal function    Codeine Nausea Only   Codeine-Guaifenesin [Guaifenesin-Codeine] Nausea Only and Other (See Comments)    Some nausea--but ok with percocet    Follow-up Information     [facility] Follow up.   Specialty: Home Health Services Why: local branch to service- Referral made for [facility] needs- (PT/OT)- they will contact you to schedule Contact information: [address] [place] [address] [phone]         [facility] Follow up.   Why: rollator and bedside commode arranged- to be delivered to room prior to discharge Contact information: [phone]        Eartha Inch, MD. Schedule an appointment as soon as possible for a visit in 2 week(s).   Specialty: Family Medicine Contact information: [address] [place] [address] [phone]  The results of significant diagnostics from this hospitalization (including imaging, microbiology, ancillary and laboratory) are listed below for reference.     Significant Diagnostic Studies: DG Chest 2 View Result Date: 05/12/2023 CLINICAL DATA:  Shortness of breath. EXAM: CHEST - 2 VIEW COMPARISON:  08/29/2018. FINDINGS: Low lung volume. Bilateral lungs appear hyperlucent with coarse bronchovascular markings, which are similar to the prior study and corresponds to emphysematous changes on the prior CT scan from 03/03/2022. No frank pulmonary edema. There is blunting of bilateral posterior costophrenic angles, suggesting bilateral trace pleural effusions. There are atelectatic changes at the right lung base. Bilateral lung fields are otherwise clear. No dense consolidation or lung collapse. No pneumothorax. Stable cardio-mediastinal silhouette. No acute osseous abnormalities. Lower cervical spinal fixation hardware noted. The soft tissues are within normal limits. IMPRESSION: *Bilateral trace pleural effusions. COPD. No dense consolidation, lung collapse or pneumothorax. Electronically Signed   By: Jules Schick M.D.   On: 05/12/2023 14:39    Microbiology: Recent Results (from the past 240 hours)  Resp panel by RT-PCR (RSV, Flu A&B, Covid) Anterior Nasal Swab     Status: Abnormal   Collection Time: 05/12/23 11:41 AM   Specimen: Anterior Nasal Swab  Result Value Ref Range Status   SARS Coronavirus 2 by RT PCR POSITIVE (A) NEGATIVE Final   Influenza A by PCR NEGATIVE NEGATIVE Final   Influenza B by PCR NEGATIVE NEGATIVE Final    Comment: (NOTE) The Xpert Xpress SARS-CoV-2/FLU/RSV plus assay is intended as an aid in the diagnosis of influenza from Nasopharyngeal swab specimens and should not be used as a sole basis for treatment. Nasal washings and aspirates are unacceptable for Xpert Xpress SARS-CoV-2/FLU/RSV testing.  Fact Sheet for Patients: BloggerCourse.com  Fact Sheet for Healthcare Providers: SeriousBroker.it  This test is not yet approved or cleared by the [place] FDA and has been  authorized for detection and/or diagnosis of SARS-CoV-2 by FDA under an Emergency Use Authorization (EUA). This EUA will remain in effect (meaning this test can be used) for the duration of the COVID-19 declaration under Section 564(b)(1) of the Act, 21 U.S.C. section 360bbb-3(b)(1), unless the authorization is terminated or revoked.     Resp Syncytial Virus by PCR NEGATIVE NEGATIVE Final    Comment: (NOTE) Fact Sheet for Patients: BloggerCourse.com  Fact Sheet for Healthcare Providers: SeriousBroker.it  This test is not yet approved or cleared by the [place] FDA and has been authorized for detection and/or diagnosis of SARS-CoV-2 by FDA under an Emergency Use Authorization (EUA). This EUA will remain in effect (meaning this test can be used) for the duration of the COVID-19 declaration under Section 564(b)(1) of the Act, 21 U.S.C. section 360bbb-3(b)(1), unless the authorization is terminated or revoked.  Performed at [facility], [address] [place], [place] [address]   Urine Culture (for pregnant, neutropenic or urologic patients or patients with an indwelling urinary catheter)     Status: Abnormal (Preliminary result)   Collection Time: 05/14/23  5:30 PM   Specimen: Urine, Clean Catch  Result Value Ref Range Status   Specimen Description URINE, CLEAN CATCH  Final   Special Requests NONE  Final   Culture (A)  Final    >=100,000 COLONIES/mL GRAM NEGATIVE RODS IDENTIFICATION TO FOLLOW Performed at [facility], [address], [place], [place] [address]    Report Status PENDING  Incomplete     Labs: Basic Metabolic Panel: Recent Labs  Lab 05/12/23 1144 05/12/23 1240 05/12/23 1349  05/13/23 0425 05/14/23 0337 05/15/23 0331 05/16/23 0259  NA 133*  --  130* 130* 131* 132* 135  K 2.7*  --  3.8 3.3* 4.1 4.1 4.3  CL 79*  --   --  80* 87* 92* 92*  CO2 35*  --   --  35* 34* 31 31  GLUCOSE 186*  --    --  345* 242* 224* 167*  BUN 39*  --   --  48* 65* 50* 49*  CREATININE 1.69*  --   --  1.85* 2.24* 1.89* 1.87*  CALCIUM 9.4  --   --  9.1 8.2* 8.3* 8.9  MG  --  2.2  --   --  1.8 1.7 2.2  PHOS  --   --   --   --  1.4* 2.9 2.5   Liver Function Tests: Recent Labs  Lab 05/14/23 0337 05/15/23 0331 05/16/23 0259  AST 31  --   --   ALT 36  --   --   ALKPHOS 78  --   --   BILITOT 0.4  --   --   PROT 5.5*  --   --   ALBUMIN 2.0* 1.9* 2.0*   No results for input(s): "LIPASE", "AMYLASE" in the last 168 hours. No results for input(s): "AMMONIA" in the last 168 hours. CBC: Recent Labs  Lab 05/12/23 1144 05/12/23 1349 05/13/23 0425 05/14/23 0337 05/15/23 0331 05/16/23 0259  WBC 15.7*  --  18.0* 18.4* 17.2* 16.8*  NEUTROABS  --   --   --   --  15.3* 14.9*  HGB 10.5* 11.2* 9.3* 8.3* 7.5* 7.9*  HCT 33.1* 33.0* 29.3* 25.3* 23.7* 25.0*  MCV 88.5  --  88.0 88.2 87.8 89.3  PLT 421*  --  398 439* 350 374   Cardiac Enzymes: No results for input(s): "CKTOTAL", "CKMB", "CKMBINDEX", "TROPONINI" in the last 168 hours. BNP: BNP (last 3 results) No results for input(s): "BNP" in the last 8760 hours.  ProBNP (last 3 results) No results for input(s): "PROBNP" in the last 8760 hours.  CBG: Recent Labs  Lab 05/15/23 1205 05/15/23 1636 05/15/23 2136 05/16/23 0617 05/16/23 1130  GLUCAP 302* 309* 177* 120* 183*       Signed:  Ramiro Harvest MD.  [facility] 05/16/2023, 3:30 PM

## 2023-05-17 LAB — URINE CULTURE: Culture: >=100000 — AB

## 2023-07-02 ENCOUNTER — Inpatient Hospital Stay (HOSPITAL_COMMUNITY)
Admission: EM | Admit: 2023-07-02 | Discharge: 2023-07-06 | DRG: 812 | Disposition: A | Discharge status: Home Health | Attending: Internal Medicine | Admitting: Internal Medicine

## 2023-07-02 ENCOUNTER — Other Ambulatory Visit: Payer: Self-pay

## 2023-07-02 ENCOUNTER — Encounter (HOSPITAL_COMMUNITY): Payer: Self-pay | Admitting: Emergency Medicine

## 2023-07-02 ENCOUNTER — Emergency Department (HOSPITAL_COMMUNITY)

## 2023-07-02 DIAGNOSIS — E871 Hypo-osmolality and hyponatremia: Secondary | ICD-10-CM | POA: Diagnosis present

## 2023-07-02 DIAGNOSIS — K648 Other hemorrhoids: Secondary | ICD-10-CM | POA: Diagnosis not present

## 2023-07-02 DIAGNOSIS — F17210 Nicotine dependence, cigarettes, uncomplicated: Secondary | ICD-10-CM | POA: Diagnosis present

## 2023-07-02 DIAGNOSIS — Z860100 Personal history of colon polyps, unspecified: Secondary | ICD-10-CM

## 2023-07-02 DIAGNOSIS — D125 Benign neoplasm of sigmoid colon: Secondary | ICD-10-CM | POA: Diagnosis not present

## 2023-07-02 DIAGNOSIS — K5730 Diverticulosis of large intestine without perforation or abscess without bleeding: Secondary | ICD-10-CM | POA: Diagnosis present

## 2023-07-02 DIAGNOSIS — Z515 Encounter for palliative care: Secondary | ICD-10-CM

## 2023-07-02 DIAGNOSIS — I4891 Unspecified atrial fibrillation: Secondary | ICD-10-CM | POA: Diagnosis present

## 2023-07-02 DIAGNOSIS — M7989 Other specified soft tissue disorders: Secondary | ICD-10-CM | POA: Diagnosis present

## 2023-07-02 DIAGNOSIS — C3490 Malignant neoplasm of unspecified part of unspecified bronchus or lung: Secondary | ICD-10-CM | POA: Diagnosis not present

## 2023-07-02 DIAGNOSIS — N179 Acute kidney failure, unspecified: Secondary | ICD-10-CM | POA: Diagnosis present

## 2023-07-02 DIAGNOSIS — D649 Anemia, unspecified: Secondary | ICD-10-CM | POA: Diagnosis not present

## 2023-07-02 DIAGNOSIS — I2510 Atherosclerotic heart disease of native coronary artery without angina pectoris: Secondary | ICD-10-CM | POA: Diagnosis present

## 2023-07-02 DIAGNOSIS — D509 Iron deficiency anemia, unspecified: Principal | ICD-10-CM

## 2023-07-02 DIAGNOSIS — M25551 Pain in right hip: Secondary | ICD-10-CM | POA: Diagnosis present

## 2023-07-02 DIAGNOSIS — Z5941 Food insecurity: Secondary | ICD-10-CM

## 2023-07-02 DIAGNOSIS — F32A Depression, unspecified: Secondary | ICD-10-CM | POA: Diagnosis present

## 2023-07-02 DIAGNOSIS — D62 Acute posthemorrhagic anemia: Secondary | ICD-10-CM | POA: Diagnosis present

## 2023-07-02 DIAGNOSIS — E876 Hypokalemia: Secondary | ICD-10-CM | POA: Diagnosis present

## 2023-07-02 DIAGNOSIS — Z888 Allergy status to other drugs, medicaments and biological substances status: Secondary | ICD-10-CM

## 2023-07-02 DIAGNOSIS — L89321 Pressure ulcer of left buttock, stage 1: Secondary | ICD-10-CM | POA: Diagnosis present

## 2023-07-02 DIAGNOSIS — Z555 Less than a high school diploma: Secondary | ICD-10-CM

## 2023-07-02 DIAGNOSIS — I5032 Chronic diastolic (congestive) heart failure: Secondary | ICD-10-CM | POA: Diagnosis present

## 2023-07-02 DIAGNOSIS — I5083 High output heart failure: Secondary | ICD-10-CM | POA: Diagnosis present

## 2023-07-02 DIAGNOSIS — J9611 Chronic respiratory failure with hypoxia: Secondary | ICD-10-CM | POA: Diagnosis present

## 2023-07-02 DIAGNOSIS — E873 Alkalosis: Secondary | ICD-10-CM

## 2023-07-02 DIAGNOSIS — Z955 Presence of coronary angioplasty implant and graft: Secondary | ICD-10-CM

## 2023-07-02 DIAGNOSIS — Z885 Allergy status to narcotic agent status: Secondary | ICD-10-CM

## 2023-07-02 DIAGNOSIS — M549 Dorsalgia, unspecified: Secondary | ICD-10-CM | POA: Diagnosis present

## 2023-07-02 DIAGNOSIS — Z79899 Other long term (current) drug therapy: Secondary | ICD-10-CM

## 2023-07-02 DIAGNOSIS — Z902 Acquired absence of lung [part of]: Secondary | ICD-10-CM

## 2023-07-02 DIAGNOSIS — K5909 Other constipation: Secondary | ICD-10-CM | POA: Diagnosis present

## 2023-07-02 DIAGNOSIS — G8929 Other chronic pain: Secondary | ICD-10-CM | POA: Diagnosis present

## 2023-07-02 DIAGNOSIS — M62838 Other muscle spasm: Secondary | ICD-10-CM | POA: Diagnosis present

## 2023-07-02 DIAGNOSIS — E785 Hyperlipidemia, unspecified: Secondary | ICD-10-CM | POA: Diagnosis present

## 2023-07-02 DIAGNOSIS — I129 Hypertensive chronic kidney disease with stage 1 through stage 4 chronic kidney disease, or unspecified chronic kidney disease: Secondary | ICD-10-CM | POA: Diagnosis not present

## 2023-07-02 DIAGNOSIS — L8990 Pressure ulcer of unspecified site, unspecified stage: Secondary | ICD-10-CM | POA: Insufficient documentation

## 2023-07-02 DIAGNOSIS — J439 Emphysema, unspecified: Secondary | ICD-10-CM | POA: Diagnosis present

## 2023-07-02 DIAGNOSIS — Z90710 Acquired absence of both cervix and uterus: Secondary | ICD-10-CM

## 2023-07-02 DIAGNOSIS — E1122 Type 2 diabetes mellitus with diabetic chronic kidney disease: Secondary | ICD-10-CM | POA: Diagnosis present

## 2023-07-02 DIAGNOSIS — C3491 Malignant neoplasm of unspecified part of right bronchus or lung: Secondary | ICD-10-CM | POA: Diagnosis present

## 2023-07-02 DIAGNOSIS — Z85118 Personal history of other malignant neoplasm of bronchus and lung: Secondary | ICD-10-CM

## 2023-07-02 DIAGNOSIS — I130 Hypertensive heart and chronic kidney disease with heart failure and stage 1 through stage 4 chronic kidney disease, or unspecified chronic kidney disease: Secondary | ICD-10-CM | POA: Diagnosis present

## 2023-07-02 DIAGNOSIS — Z9981 Dependence on supplemental oxygen: Secondary | ICD-10-CM

## 2023-07-02 DIAGNOSIS — D122 Benign neoplasm of ascending colon: Secondary | ICD-10-CM | POA: Diagnosis present

## 2023-07-02 DIAGNOSIS — G894 Chronic pain syndrome: Secondary | ICD-10-CM | POA: Diagnosis present

## 2023-07-02 DIAGNOSIS — D124 Benign neoplasm of descending colon: Secondary | ICD-10-CM | POA: Diagnosis present

## 2023-07-02 DIAGNOSIS — I480 Paroxysmal atrial fibrillation: Secondary | ICD-10-CM | POA: Diagnosis present

## 2023-07-02 DIAGNOSIS — Z8249 Family history of ischemic heart disease and other diseases of the circulatory system: Secondary | ICD-10-CM

## 2023-07-02 DIAGNOSIS — G2581 Restless legs syndrome: Secondary | ICD-10-CM | POA: Diagnosis present

## 2023-07-02 DIAGNOSIS — I50810 Right heart failure, unspecified: Secondary | ICD-10-CM | POA: Diagnosis present

## 2023-07-02 DIAGNOSIS — J449 Chronic obstructive pulmonary disease, unspecified: Secondary | ICD-10-CM | POA: Diagnosis present

## 2023-07-02 DIAGNOSIS — D123 Benign neoplasm of transverse colon: Secondary | ICD-10-CM | POA: Diagnosis present

## 2023-07-02 DIAGNOSIS — E1141 Type 2 diabetes mellitus with diabetic mononeuropathy: Secondary | ICD-10-CM | POA: Diagnosis present

## 2023-07-02 DIAGNOSIS — M1990 Unspecified osteoarthritis, unspecified site: Secondary | ICD-10-CM | POA: Diagnosis present

## 2023-07-02 DIAGNOSIS — Z7901 Long term (current) use of anticoagulants: Secondary | ICD-10-CM | POA: Diagnosis not present

## 2023-07-02 DIAGNOSIS — G609 Hereditary and idiopathic neuropathy, unspecified: Secondary | ICD-10-CM | POA: Insufficient documentation

## 2023-07-02 DIAGNOSIS — L89311 Pressure ulcer of right buttock, stage 1: Secondary | ICD-10-CM | POA: Diagnosis present

## 2023-07-02 DIAGNOSIS — J9612 Chronic respiratory failure with hypercapnia: Secondary | ICD-10-CM | POA: Diagnosis present

## 2023-07-02 DIAGNOSIS — F17200 Nicotine dependence, unspecified, uncomplicated: Secondary | ICD-10-CM | POA: Insufficient documentation

## 2023-07-02 DIAGNOSIS — E878 Other disorders of electrolyte and fluid balance, not elsewhere classified: Secondary | ICD-10-CM | POA: Diagnosis present

## 2023-07-02 DIAGNOSIS — M109 Gout, unspecified: Secondary | ICD-10-CM | POA: Diagnosis present

## 2023-07-02 DIAGNOSIS — R001 Bradycardia, unspecified: Secondary | ICD-10-CM | POA: Diagnosis present

## 2023-07-02 DIAGNOSIS — N184 Chronic kidney disease, stage 4 (severe): Secondary | ICD-10-CM | POA: Insufficient documentation

## 2023-07-02 LAB — CBC WITH DIFFERENTIAL/PLATELET
Abs Immature Granulocytes: 0.05 10*3/uL (ref 0.00–0.07)
Abs Immature Granulocytes: 0.05 10*3/uL (ref 0.00–0.07)
Basophils Absolute: 0 10*3/uL (ref 0.0–0.1)
Basophils Absolute: 0 10*3/uL (ref 0.0–0.1)
Basophils Relative: 0 %
Basophils Relative: 0 %
Eosinophils Absolute: 0 10*3/uL (ref 0.0–0.5)
Eosinophils Absolute: 0 10*3/uL (ref 0.0–0.5)
Eosinophils Relative: 0 %
Eosinophils Relative: 0 %
HCT: 18.8 % — ABNORMAL LOW (ref 36.0–46.0)
HCT: 23.3 % — ABNORMAL LOW (ref 36.0–46.0)
Hemoglobin: 5.6 g/dL — CL (ref 12.0–15.0)
Hemoglobin: 7.2 g/dL — ABNORMAL LOW (ref 12.0–15.0)
Immature Granulocytes: 1 %
Immature Granulocytes: 1 %
Lymphocytes Relative: 8 %
Lymphocytes Relative: 5 %
Lymphs Abs: 0.8 10*3/uL (ref 0.7–4.0)
Lymphs Abs: 0.5 10*3/uL — ABNORMAL LOW (ref 0.7–4.0)
MCH: 23.5 pg — ABNORMAL LOW (ref 26.0–34.0)
MCH: 24.1 pg — ABNORMAL LOW (ref 26.0–34.0)
MCHC: 29.8 g/dL — ABNORMAL LOW (ref 30.0–36.0)
MCHC: 30.9 g/dL (ref 30.0–36.0)
MCV: 79 fL — ABNORMAL LOW (ref 80.0–100.0)
MCV: 77.9 fL — ABNORMAL LOW (ref 80.0–100.0)
Monocytes Absolute: 0.9 10*3/uL (ref 0.1–1.0)
Monocytes Absolute: 0.9 10*3/uL (ref 0.1–1.0)
Monocytes Relative: 9 %
Monocytes Relative: 9 %
Neutro Abs: 7.9 10*3/uL — ABNORMAL HIGH (ref 1.7–7.7)
Neutro Abs: 8.2 10*3/uL — ABNORMAL HIGH (ref 1.7–7.7)
Neutrophils Relative %: 82 %
Neutrophils Relative %: 85 %
Platelets: 345 10*3/uL (ref 150–400)
Platelets: 339 10*3/uL (ref 150–400)
RBC: 2.38 MIL/uL — ABNORMAL LOW (ref 3.87–5.11)
RBC: 2.99 MIL/uL — ABNORMAL LOW (ref 3.87–5.11)
RDW: 17.2 % — ABNORMAL HIGH (ref 11.5–15.5)
RDW: 17.2 % — ABNORMAL HIGH (ref 11.5–15.5)
WBC: 9.7 10*3/uL (ref 4.0–10.5)
WBC: 9.6 10*3/uL (ref 4.0–10.5)
nRBC: 0 % (ref 0.0–0.2)
nRBC: 0 % (ref 0.0–0.2)

## 2023-07-02 LAB — RETIC PANEL
Immature Retic Fract: 28.1 % — ABNORMAL HIGH (ref 2.3–15.9)
RBC.: 2.41 MIL/uL — ABNORMAL LOW (ref 3.87–5.11)
Retic Count, Absolute: 61 10*3/uL (ref 19.0–186.0)
Retic Ct Pct: 2.5 % (ref 0.4–3.1)
Reticulocyte Hemoglobin: 18.3 pg — ABNORMAL LOW (ref >27.9)

## 2023-07-02 LAB — COMPREHENSIVE METABOLIC PANEL WITH GFR
ALT: 37 U/L (ref 0–44)
AST: 38 U/L (ref 15–41)
Albumin: 2.9 g/dL — ABNORMAL LOW (ref 3.5–5.0)
Alkaline Phosphatase: 90 U/L (ref 38–126)
Anion gap: 14 (ref 5–15)
BUN: 40 mg/dL — ABNORMAL HIGH (ref 8–23)
CO2: 42 mmol/L — ABNORMAL HIGH (ref 22–32)
Calcium: 8.8 mg/dL — ABNORMAL LOW (ref 8.9–10.3)
Chloride: 72 mmol/L — ABNORMAL LOW (ref 98–111)
Creatinine, Ser: 2.03 mg/dL — ABNORMAL HIGH (ref 0.44–1.00)
GFR, Estimated: 26 mL/min — ABNORMAL LOW (ref >60)
Glucose, Bld: 127 mg/dL — ABNORMAL HIGH (ref 70–99)
Potassium: 2.3 mmol/L — CL (ref 3.5–5.1)
Sodium: 128 mmol/L — ABNORMAL LOW (ref 135–145)
Total Bilirubin: 0.5 mg/dL (ref 0.0–1.2)
Total Protein: 6.1 g/dL — ABNORMAL LOW (ref 6.5–8.1)

## 2023-07-02 LAB — IRON AND TIBC
Iron: 12 ug/dL — ABNORMAL LOW (ref 28–170)
Saturation Ratios: 2 % — ABNORMAL LOW (ref 10.4–31.8)
TIBC: 535 ug/dL — ABNORMAL HIGH (ref 250–450)
UIBC: 523 ug/dL

## 2023-07-02 LAB — TECHNOLOGIST SMEAR REVIEW: Plt Morphology: NORMAL

## 2023-07-02 LAB — MAGNESIUM
Magnesium: 2.3 mg/dL (ref 1.7–2.4)
Magnesium: 2.4 mg/dL (ref 1.7–2.4)

## 2023-07-02 LAB — BRAIN NATRIURETIC PEPTIDE: B Natriuretic Peptide: 1153.7 pg/mL — ABNORMAL HIGH (ref 0.0–100.0)

## 2023-07-02 LAB — PROTIME-INR
INR: 1.4 — ABNORMAL HIGH (ref 0.8–1.2)
Prothrombin Time: 17.2 s — ABNORMAL HIGH (ref 11.4–15.2)

## 2023-07-02 LAB — PREPARE RBC (CROSSMATCH)

## 2023-07-02 LAB — FERRITIN: Ferritin: 8 ng/mL — ABNORMAL LOW (ref 11–307)

## 2023-07-02 LAB — POC OCCULT BLOOD, ED: Fecal Occult Bld: NEGATIVE

## 2023-07-02 MED ORDER — REVEFENACIN 175 MCG/3ML IN SOLN
175.0000 ug | Freq: Every day | RESPIRATORY_TRACT | Status: DC
  Administered 2023-07-03 – 2023-07-06 (×4): 175 ug via RESPIRATORY_TRACT
  Filled 2023-07-02 (×5): qty 3

## 2023-07-02 MED ORDER — SIMETHICONE 80 MG PO CHEW
240.0000 mg | CHEWABLE_TABLET | Freq: Once | ORAL | Status: AC
  Administered 2023-07-03: 240 mg via ORAL
  Filled 2023-07-02: qty 3

## 2023-07-02 MED ORDER — POTASSIUM CHLORIDE 10 MEQ/100ML IV SOLN
10.0000 meq | INTRAVENOUS | Status: AC
Start: 2023-07-02 — End: 2023-07-02
  Administered 2023-07-02 (×3): 10 meq via INTRAVENOUS
  Filled 2023-07-02 (×3): qty 100

## 2023-07-02 MED ORDER — SODIUM CHLORIDE 0.9% IV SOLUTION: Freq: Once | INTRAVENOUS | Status: DC

## 2023-07-02 MED ORDER — ARFORMOTEROL TARTRATE 15 MCG/2ML IN NEBU
15.0000 ug | INHALATION_SOLUTION | Freq: Two times a day (BID) | RESPIRATORY_TRACT | Status: DC
  Administered 2023-07-02 – 2023-07-06 (×8): 15 ug via RESPIRATORY_TRACT
  Filled 2023-07-02 (×8): qty 2

## 2023-07-02 MED ORDER — NA SULFATE-K SULFATE-MG SULF 17.5-3.13-1.6 GM/177ML PO SOLN
0.5000 | Freq: Once | ORAL | Status: AC
  Administered 2023-07-04: 177 mL via ORAL
  Filled 2023-07-02: qty 1

## 2023-07-02 MED ORDER — GABAPENTIN 300 MG PO CAPS
300.0000 mg | ORAL_CAPSULE | Freq: Two times a day (BID) | ORAL | Status: DC
  Administered 2023-07-02 – 2023-07-06 (×9): 300 mg via ORAL
  Filled 2023-07-02 (×9): qty 1

## 2023-07-02 MED ORDER — FUROSEMIDE 10 MG/ML IJ SOLN
60.0000 mg | Freq: Once | INTRAMUSCULAR | Status: AC
  Administered 2023-07-02: 60 mg via INTRAVENOUS
  Filled 2023-07-02: qty 6

## 2023-07-02 MED ORDER — SIMETHICONE 80 MG PO CHEW
240.0000 mg | CHEWABLE_TABLET | Freq: Once | ORAL | Status: AC
  Administered 2023-07-04: 240 mg via ORAL
  Filled 2023-07-02: qty 3

## 2023-07-02 MED ORDER — VENLAFAXINE HCL ER 150 MG PO CP24
150.0000 mg | ORAL_CAPSULE | Freq: Every day | ORAL | Status: DC
  Administered 2023-07-02 – 2023-07-06 (×5): 150 mg via ORAL
  Filled 2023-07-02 (×5): qty 1

## 2023-07-02 MED ORDER — ROSUVASTATIN CALCIUM 5 MG PO TABS
10.0000 mg | ORAL_TABLET | Freq: Every day | ORAL | Status: DC
  Administered 2023-07-02 – 2023-07-06 (×5): 10 mg via ORAL
  Filled 2023-07-02: qty 1
  Filled 2023-07-02: qty 2
  Filled 2023-07-02 (×2): qty 1
  Filled 2023-07-02: qty 2

## 2023-07-02 MED ORDER — AMIODARONE HCL 200 MG PO TABS
200.0000 mg | ORAL_TABLET | Freq: Every day | ORAL | Status: DC
  Administered 2023-07-02 – 2023-07-06 (×5): 200 mg via ORAL
  Filled 2023-07-02 (×6): qty 1

## 2023-07-02 MED ORDER — IPRATROPIUM-ALBUTEROL 0.5-2.5 (3) MG/3ML IN SOLN: 3.0000 mL | RESPIRATORY_TRACT | Status: DC | PRN

## 2023-07-02 MED ORDER — NA SULFATE-K SULFATE-MG SULF 17.5-3.13-1.6 GM/177ML PO SOLN
0.5000 | Freq: Once | ORAL | Status: AC
  Administered 2023-07-03: 177 mL via ORAL
  Filled 2023-07-02: qty 1

## 2023-07-02 MED ORDER — POTASSIUM CHLORIDE CRYS ER 20 MEQ PO TBCR
40.0000 meq | EXTENDED_RELEASE_TABLET | Freq: Two times a day (BID) | ORAL | Status: AC
  Administered 2023-07-02 (×2): 40 meq via ORAL
  Filled 2023-07-02 (×2): qty 2

## 2023-07-02 MED ORDER — BISOPROLOL FUMARATE 5 MG PO TABS
2.5000 mg | ORAL_TABLET | Freq: Every day | ORAL | Status: DC
  Administered 2023-07-02 – 2023-07-05 (×4): 2.5 mg via ORAL
  Filled 2023-07-02 (×4): qty 1

## 2023-07-02 MED ORDER — ALLOPURINOL 100 MG PO TABS
100.0000 mg | ORAL_TABLET | Freq: Every day | ORAL | Status: DC
  Administered 2023-07-02 – 2023-07-06 (×5): 100 mg via ORAL
  Filled 2023-07-02 (×5): qty 1

## 2023-07-02 MED ORDER — BUSPIRONE HCL 5 MG PO TABS
15.0000 mg | ORAL_TABLET | Freq: Two times a day (BID) | ORAL | Status: DC
  Administered 2023-07-02 – 2023-07-06 (×9): 15 mg via ORAL
  Filled 2023-07-02 (×9): qty 3

## 2023-07-02 MED ORDER — PANTOPRAZOLE SODIUM 40 MG PO TBEC
40.0000 mg | DELAYED_RELEASE_TABLET | Freq: Every day | ORAL | Status: DC
  Administered 2023-07-02 – 2023-07-06 (×5): 40 mg via ORAL
  Filled 2023-07-02 (×5): qty 1

## 2023-07-02 NOTE — Plan of Care (Signed)

## 2023-07-02 NOTE — H&P (Signed)
 Date: 07/02/2023               Patient Name:  Maria Arroyo MRN: [id number]  DOB: 05-Nov-1950 Age / Sex: 73 y.o., female   PCP: Eartha Inch, MD              Medical Service: Internal Medicine Teaching Service              Attending Physician: Dr. Ginnie Smart, MD    First Contact: Dr. Mickie Bail Pager: [phone]  Second Contact: Dr. Benito Mccreedy     Third Contact  Leonor Liv, MS3         After Hours (After 5p/  First Contact Pager: [phone]  weekends / holidays): Second Contact Pager: [phone]   Chief Complaint: B/l leg and foot swelling and pain  History of Present Illness: History was difficult to obtain. Pt reports she saw her PCP because she was hurting and her foot were swollen for 2-3 weeks with worsening dyspnea. She also had worsening weakness for the past 3 weeks and now needs walker assistance with ambulation. Her husband at bedside reports she couldn't even stand up to go to the bathroom and that is when they decided to go to PCP. Her PCP checked Hgb and  it was 6.4 and she was told to come to ED. At the ED Hg was 5.6 she was given NS.   She endorses orthopnea and DOE. She has reduced sensory in her  feet. Last time she took Eliquis was last night. She has a hx of COPD and is on 3L oxygen at home, her home albuterol has helped minimally with this acute episode. Reports she has headaches at baseline. She has a cough that is baseline, productive of yellow sputum. Denies chronic ibuprofen use. Denies hematemesis, melena, hematochezia, chest pain, palpitations, or abdominal pain.   Medical history:  HF- Last echo 55-60%, She takes torsemide 20 mg daily. Dry weight is around 140. COPD- on 3L of O2 at home Anemia- Chronic. Reports an episode of Epistaxis last week that bled exntesively.  Lung cancer - 3 years ago s/p Lobectomy  OA- She has chroinc hip pain and gets steroid injections.  Chronic back pain- Oxycodone - Not taking Atrial fibrillation - hisotry of  Afib, was satrted on eliquis. She took this last night. She is also on amiodorone. She denies any palpitations and is unable to provide history of afib.   ED course: BNP 1153 CXR: Ongoing right pleural effusion, small to moderate. Chronic cardiomegaly. Emphysema (ICD10-J43.9). Mild interstitial edema is possible. potassium chloride 10 mEq in 100 mL IVPB POC occult: Negative occult blood   Meds: Current Facility-Administered Medications  Medication Dose Route Frequency Provider Last Rate Last Admin   0.9 %  sodium chloride infusion (Manually program via Guardrails IV Fluids)   Intravenous Once Kathleen Lime, MD       potassium chloride 10 mEq in 100 mL IVPB  10 mEq Intravenous Q1 Hr x 3 Benjiman Core, MD 100 mL/hr at 07/02/23 0955 10 mEq at 07/02/23 6213     Current Outpatient Medications  Medication Sig Dispense Refill   pantoprazole (PROTONIX) 40 MG tablet Take 1 tablet (40 mg total) by mouth daily at 6 (six) AM. 30 tablet 0   albuterol (VENTOLIN HFA) 108 (90 Base) MCG/ACT inhaler Inhale 2 puffs into the lungs every 6 (six) hours as needed for shortness of breath or wheezing.     allopurinol (ZYLOPRIM) 100 MG tablet Take 100 mg by  mouth daily.      amiodarone (PACERONE) 200 MG tablet Take 1 tablet (200 mg total) by mouth daily. 90 tablet 3   apixaban (ELIQUIS) 5 MG TABS tablet Take 1 tablet (5 mg total) by mouth 2 (two) times daily. 180 tablet 3   Ascorbic Acid (VITAMIN C PO) Take 1,000 mg by mouth daily.     bisoprolol (ZEBETA) 5 MG tablet Take 0.5 tablets (2.5 mg total) by mouth daily. 45 tablet 3   busPIRone (BUSPAR) 15 MG tablet Take 15 mg by mouth 2 (two) times daily.     Calcium Carbonate-Vitamin D (CALCIUM 600 + D PO) Take 1 tablet by mouth daily.     carisoprodol (SOMA) 350 MG tablet Take 1 tablet by mouth 4 (four) times daily as needed for muscle spasms.     Cholecalciferol (VITAMIN D) 2000 UNITS tablet Take 4,000 Units by mouth daily.     diazepam (VALIUM) 5 MG tablet  Take 5 mg by mouth daily.     fluticasone (FLONASE) 50 MCG/ACT nasal spray Place 2 sprays into both nostrils daily for 7 days. 11.1 mL 0   gabapentin (NEURONTIN) 300 MG capsule Take 1 capsule (300 mg total) by mouth 2 (two) times daily. 60 capsule 1   hydrALAZINE (APRESOLINE) 25 MG tablet Take 1 tablet (25 mg total) by mouth every 8 (eight) hours. 90 tablet 1   ibuprofen (ADVIL) 200 MG tablet Take 200 mg by mouth every 6 (six) hours as needed for headache.     Ipratropium-Albuterol (COMBIVENT) 20-100 MCG/ACT AERS respimat Inhale 1 puff into the lungs 2 (two) times daily for 7 days.     isosorbide dinitrate (ISORDIL) 10 MG tablet Take 1 tablet (10 mg total) by mouth 3 (three) times daily. 90 tablet 1   loratadine (CLARITIN) 10 MG tablet Take 1 tablet (10 mg total) by mouth daily. 30 tablet 0   Multiple Vitamin (MULTIVITAMIN WITH MINERALS) TABS tablet Take 1 tablet by mouth daily. 30 tablet 0   nitroGLYCERIN (NITROSTAT) 0.4 MG SL tablet PLACE 1 TABLET UNDER THE TONGUE EVERY 5 MINUTES AS NEEDED CHEST PAIN. (Patient taking differently: Place 0.4 mg under the tongue every 5 (five) minutes as needed for chest pain.) 25 tablet 1   oxyCODONE-acetaminophen (PERCOCET) 10-325 MG tablet Take 1 tablet by mouth 4 (four) times daily as needed for pain.     rosuvastatin (CRESTOR) 40 MG tablet TAKE 1 TABLET BY MOUTH DAILY 90 tablet 3   torsemide (DEMADEX) 20 MG tablet Take 1 tablet (20 mg total) by mouth daily. 90 tablet 3   traZODone (DESYREL) 50 MG tablet Take 50 mg by mouth at bedtime as needed for sleep.     valACYclovir (VALTREX) 1000 MG tablet Take 500 mg by mouth daily as needed (for outbreaks).     venlafaxine XR (EFFEXOR-XR) 150 MG 24 hr capsule Take 150 mg by mouth daily.  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 11/07/2018   COPD (chronic obstructive pulmonary disease) (HCC)    Coronary artery disease    Depression    Gout  R foot and knee   Hyperlipidemia    Hypertension    Left foot drop    Neuropathy    OA (osteoarthritis)    Pica    Tobacco abuse      Past Surgical History:  Procedure Laterality Date   ABDOMINAL HYSTERECTOMY     ANTERIOR CERVICAL DECOMP/DISCECTOMY FUSION  02/06/2011   Procedure: ANTERIOR CERVICAL DECOMPRESSION/DISCECTOMY FUSION 2 LEVELS;  Surgeon: Carmela Hurt;  Location: [facility] NEURO ORS;  Service: Neurosurgery;  Laterality: N/A;  Anterior Cervical Four-Five/Five-Six Decompression and Fusion with Plating and Bonegraft   BLADDER SURGERY     CARDIAC CATHETERIZATION  04/09/2009   EF 60%   CARDIAC CATHETERIZATION  03/24/2001   EF 55%   CARDIAC CATHETERIZATION  01/06/2001   EF 65%   CHOLECYSTECTOMY     CORONARY STENT PLACEMENT  03/2009   STENTING OF THE PROXIMAL TO MID RIGHT CORONARY   IR THORACENTESIS ASP PLEURAL SPACE W/IMG GUIDE  06/01/2018   LEFT HEART CATHETERIZATION WITH CORONARY ANGIOGRAM N/A 12/21/2011   Procedure: LEFT HEART CATHETERIZATION WITH CORONARY ANGIOGRAM;  Surgeon: Peter M Swaziland, MD;  Location: [facility] CATH LAB;  Service: Cardiovascular;  Laterality: N/A;   LOBECTOMY     NODE DISSECTION N/A 04/04/2018   Procedure: NODE DISSECTION;  Surgeon: Loreli Slot, MD;  Location: [facility] OR;  Service: Thoracic;  Laterality: N/A;   OTHER SURGICAL HISTORY  12/2014   R foot cyst removal   VIDEO ASSISTED THORACOSCOPY (VATS)/ LOBECTOMY Right 04/04/2018   Procedure: VIDEO ASSISTED THORACOSCOPY (VATS)/RIGHT LOWER LOBECTOMY;  Surgeon: Loreli Slot, MD;  Location: [facility] OR;  Service: Thoracic;  Laterality: Right;   Family History  Problem Relation Age of  Onset   Aneurysm Mother    Heart attack Father    Heart failure Father    Stroke Sister    Heart attack Brother    Social History  - performs ADLs normally, limited iADLs - cigarettes: 2 ppd for 60 yrs - denies alcohol use  - one meal daily baseline   Review of Systems: Review of Systems  Constitutional:  Negative for chills, fever and weight loss.  HENT:  Positive for nosebleeds.   Respiratory:  Positive for cough, sputum production, shortness of breath and wheezing. Negative for hemoptysis.   Cardiovascular:  Positive for orthopnea and leg swelling. Negative for chest pain and palpitations.  Gastrointestinal:  Negative for abdominal pain, blood in stool and melena.  Genitourinary:  Negative for hematuria.  Musculoskeletal:  Positive for back pain and joint pain.  Neurological:  Positive for sensory change and weakness.  Endo/Heme/Allergies:  Bruises/bleeds easily.    Physical Exam: Blood pressure (!) 146/57, pulse 63, temperature 97.8 F (36.6 C), temperature source Oral, resp. rate 18, height 5\' 1"  (1.549 m), weight 68.7 kg, SpO2 100%.  General appearance: sitting up in bed, ill appearing HEENT conjunctival pallor, pupils equal, round, reactive to light and accomodation, blood in R ear Throat: normal findings: oropharynx pink & moist without lesions or evidence of thrush Resp: increased respiratory effort, tripoding, wheezing, no crackles  Cardio: regular rate and rhythm, no murmurs,  Extremities: 3+ pitting edema of b/l LE, 1+  R dorsalis pedal pulse, 2+ L dorsalis pedis pulse on the L   Lab results:     Component Value Date/Time   BNP 1,153.7 (H) 07/02/2023 0712       Latest Ref Rng & Units 07/02/2023    7:12 AM 05/16/2023    2:59 AM 05/15/2023    3:31  AM  CBC  WBC 4.0 - 10.5 K/uL 9.7  16.8  17.2   Hemoglobin 12.0 - 15.0 g/dL 5.6  7.9  7.5   Hematocrit 36.0 - 46.0 % 18.8  25.0  23.7   Platelets 150 - 400 K/uL 345  374  350    Elevated Neutrophils 7.9      Latest Ref Rng & Units 07/02/2023    7:12 AM 05/16/2023    2:59 AM 05/15/2023    3:31 AM  CMP  Glucose 70 - 99 mg/dL 130  865  784   BUN 8 - 23 mg/dL 40  49  50   Creatinine 0.44 - 1.00 mg/dL 6.96  2.95  2.84   Sodium 135 - 145 mmol/L 128  135  132   Potassium 3.5 - 5.1 mmol/L 2.3  4.3  4.1   Chloride 98 - 111 mmol/L 72  92  92   CO2 22 - 32 mmol/L 42  31  31   Calcium 8.9 - 10.3 mg/dL 8.8  8.9  8.3   Total Protein 6.5 - 8.1 g/dL 6.1     Total Bilirubin 0.0 - 1.2 mg/dL 0.5     Alkaline Phos 38 - 126 U/L 90     AST 15 - 41 U/L 38     ALT 0 - 44 U/L 37       Component Ref Range & Units (hover) 07:12 4 yr ago  Iron 12 Low  9 Low   TIBC 535 High  424  Saturation Ratios 2 Low  2 Low   UIBC 523 415    Component Ref Range & Units (hover) 07:12 4 yr ago  Ferritin 8 Low  8 Low         Component Ref Range & Units (hover) 07:12 4 yr ago  Retic Ct Pct 2.5 1.7  RBC. 2.41 Low  3.03 Low   Retic Count, Absolute 61.0 52.7  Immature Retic Fract 28.1 High  29.9 High  CM  Reticulocyte Hemoglobin 18.3 Low      POC occult: Negative occult blood  Magnesium: 2.3    Imaging results:  DG Chest Portable 1 View Result Date: 07/02/2023 CLINICAL DATA:  73 year old female with shortness of breath. EXAM: PORTABLE CHEST 1 VIEW COMPARISON:  Chest radiographs 05/12/2023 and earlier. FINDINGS: Portable AP semi upright view at 0730 hours. Ongoing right pleural effusion, small to moderate. More conspicuous fluid in the right minor fissure now. Underlying cardiomegaly. Stable cardiac size and mediastinal contours. Visualized tracheal air column is within normal limits. Stable pulmonary vascularity, mild interstitial edema is possible. No consolidation. But centrilobular emphysema confirmed on a 2023 chest CT. Cervical ACDF. Degenerative changes in the spine. Negative visible bowel gas pattern. IMPRESSION: Ongoing right pleural effusion, small to moderate. Chronic cardiomegaly. Emphysema (ICD10-J43.9). Mild  interstitial edema is possible. Electronically Signed   By: Odessa Fleming M.D.   On: 07/02/2023 07:40     Other results:  Echo 08/27/2018: FINDINGS   Left Ventricle: The left ventricle has normal systolic function, with an  ejection fraction of 55-60%. The cavity size was mildly dilated. There is  no increase in left ventricular wall thickness. Left ventricular diastolic  Doppler parameters are  consistent with impaired relaxation.   Right Ventricle: The right ventricle has moderately reduced systolic  function. The cavity was mildly enlarged. There is no increase in right  ventricular wall thickness. Poor acoustic windows limit study of RV.   Left Atrium: Left atrial size was  normal in size.   Right Atrium: Right atrial size was moderately dilated. Right atrial  pressure is estimated at 3 mmHg.   Interatrial Septum: The interatrial septum was not assessed.   Pericardium: There is no evidence of pericardial effusion.   Mitral Valve: The mitral valve is abnormal. Mild thickening of the mitral  valve leaflet. Mild calcification of the mitral valve leaflet. Mitral  valve regurgitation is mild to moderate by color flow Doppler. MR is  eccentric, directed posterior into LA  Mild to moderate in severity.   Tricuspid Valve: The tricuspid valve is normal in structure. Tricuspid  valve regurgitation is mild by color flow Doppler.   Aortic Valve: The aortic valve is abnormal Mild thickening of the aortic  valve. Moderate calcification of the aortic valve. Aortic valve  regurgitation was not visualized by color flow Doppler.   Pulmonic Valve: The pulmonic valve was not well visualized. Pulmonic valve  regurgitation is mild by color flow Doppler.   Venous: The inferior vena cava is normal in size with greater than 50%  respiratory variability.     Maria Arroyo is a 73 y.o. 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  Assessment & Plan by Problem: Principal Problem:  #Acute on chronic anemia Presented with a Hgb of 5.6 on presentation from 6.4 at her PCP's office. She has had dyspnea with or without exertion. Denies any signs of bleeding, no hematemesis or melana. She however reported she had nose bleeding last week that lasted a couple of days. I wonder if that is the reason for her low Hgb even though its less likely that she lost about 2 units of blood from nose bleed that wasn't brisk. . Will hold her Eliquis, give a unit of blood and have GI see her for further assessment . Will empirically give PPI.  - Transfuse 1 unit  - Post HnH - PPI - GI consult  - Hold Eliquis  - PT/INR  #Acute on chronic hypoxic respiratory failure #HFpEF   #R pleural effusion  Progressive leg swelling over the past few weeks with worsening dyspnea. BNP on presentation of  1153.7,Evidence of cardiomegaly on chest XR . 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 Its unclear if she is adherent to this verse if she is not having adequate diuresing.  - Daily weights - Strict I/os - Continuous pulse ox while ambulating - Incentive spirometry  #COPD (chronic obstructive pulmonary disease) (HCC) Wheezing on exam, she reports inhalers were mildly helpful. 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 - Tele monitoring - Continue amiodorne - Holding Eliquis with concerns for   bleeding - Klor Con   #CKD4 - No active concerns  - Trend Cr   #HTN - hold home meds    #Chronic pain #Osteoarthritis Hold home oxycodone  VTE prophylaxis: SCDs Diet: NPO IVF: NS Code: Full PT/OT recommendations: None Family Update: Husband   This  is a Psychologist, occupational Note.  The care of the patient was discussed with Dr. Mickie Bail  and the assessment and plan was formulated with their assistance.  Please see their note for official documentation of the patient encounter.   Signed: Rosana Fret, Medical Student 07/02/2023, 11:14 AM

## 2023-07-02 NOTE — Progress Notes (Signed)
 PT Cancellation Note  Patient Details Name: Maria Arroyo MRN: [id number] DOB: 04-Oct-1950   Cancelled Treatment:    Reason Eval/Treat Not Completed: Medical issues which prohibited therapy (PT consult appreciated and chart reviewed. Pt with symptomatic anemia, HgB 5.6. RN reports pt is currently recieving RBC transfusion. Will follow-up to complete PT evaluation when pt is more medically stable and able to participate.)  Cheri Guppy, PT, DPT Acute Rehabilitation Services Office: [phone] Secure Chat Preferred  Richardson Chiquito 07/02/2023, 1:17 PM

## 2023-07-02 NOTE — Consult Note (Signed)
 Consultation Note   Referring Provider:  [facility] PCP: Eartha Inch, MD Primary Gastroenterologist:: Mariana Kaufman, MD         Reason for Consultation:  DOA: 07/02/2023         Hospital Day: 1   ASSESSMENT    73 y.o. year old female with multiple medical problems admitted with worsening anemia   Worsening of chronic iron deficiency anemia No overt blood loss and FOBT negative Baseline hgb 10.5 to 11.5, drifting over last couple of months to 5.6 today   History of colon polyps Limited records in Care Everywhere but in July 2019 she had a colonoscopy with removal a 1 cm sigmoid polyps. 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  However she is at high risk for procedures given multiple medical problems and chronic respiratory failure. Patient is open to endoscopic evaluation. Will discuss with Dr. Adela Lank --Check celiac serologies --Blood transfusion in progress --Would benefit from IV iron   HPI   Xochil has chronic anemia. Baseline 10-11 range .  She was hospitalized February 2025 at which time she presented with a hemoglobin of 9.3.  During that admission her hemoglobin declined further to 7.9 for unclear reasons ( it wasn't a primary concern as she apparently wasn't having any  bleeding).  She was not transfused  Patient saw her PCP yesterday, hemoglobin was down to 6.4 and she was sent to the ED.   In the ED her hemoglobin was 5.6.  Ferritin 8, she does not take oral iron at home.  She is on Eliquis but denies any overt GI blood loss in  the form of red blood or black stools or hematemesis.  No abdominal pain.  She has unintentionally lost a few pounds recently.  She has chronic constipation but no other GI complaints. She denies a history of GERD but takes a PPI.   As far as I can tell in Care Everywhere her last colonoscopy was in 2019.  She thinks she may have had a more recent colonoscopy in [place]   Previous GI Studies   Labs this admission Ferritin 8  Colonoscopy - Care Everywhere. Dr. Jason Fila Colonoscopy on 10/05/2017 . Indication for procedure: Screening Findings: The patient was noted to have sigmoid diverticulosis and a 1 cm sigmoid polyp removed with snare cautery and a clip was placed due to the patient's anticoagulation use.   *Unable to locate path:  Labs and Imaging:  Recent Labs    07/02/23 0712  WBC 9.7  HGB 5.6*  HCT 18.8*  MCV 79.0*  PLT 345   Recent Labs    07/02/23 0712  FERRITIN 8*  TIBC 535*  IRONPCTSAT 2*   Recent Labs    07/02/23 0712  NA 128*  K 2.3*  CL 72*  CO2 42*  GLUCOSE 127*  BUN 40*  CREATININE 2.03*  CALCIUM 8.8*   Recent Labs    07/02/23 0712  PROT 6.1*  ALBUMIN 2.9*  AST 38  ALT 37  ALKPHOS 90  BILITOT 0.5   No results for input(s): "INR" in the last 72 hours. No results for input(s): "AFPTUMOR" in the last 72 hours.  DG Chest Portable 1 View CLINICAL DATA:  73 year old female with shortness of breath.  EXAM: PORTABLE CHEST 1 VIEW  COMPARISON:  Chest radiographs 05/12/2023 and earlier.  FINDINGS: Portable AP semi upright view at 0730 hours. Ongoing right pleural effusion, small to moderate. More conspicuous fluid in the right minor fissure now. Underlying cardiomegaly. Stable cardiac size and mediastinal contours. Visualized tracheal air column is within normal limits. Stable pulmonary vascularity, mild interstitial edema is possible. No consolidation. But centrilobular emphysema confirmed on a 2023 chest CT. Cervical ACDF. Degenerative  changes in the spine. Negative visible bowel gas pattern.  IMPRESSION: Ongoing right pleural effusion, small to moderate. Chronic cardiomegaly. Emphysema (ICD10-J43.9). Mild interstitial edema is possible.  Electronically Signed   By: Odessa Fleming M.D.   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 11/07/2018   COPD (chronic obstructive pulmonary disease) (HCC)    Coronary artery disease    Depression    Gout    R foot and knee   Hyperlipidemia    Hypertension    Left foot drop    Neuropathy    OA (osteoarthritis)    Pica    Tobacco abuse     Past Surgical History:  Procedure Laterality Date   ABDOMINAL HYSTERECTOMY     ANTERIOR CERVICAL DECOMP/DISCECTOMY FUSION  02/06/2011   Procedure: ANTERIOR CERVICAL DECOMPRESSION/DISCECTOMY FUSION 2 LEVELS;  Surgeon: Carmela Hurt;  Location: [facility]UR[facility] ORS;  Service: Neurosurgery;  Laterality: N/A;  Anterior Cervical Four-Five/Five-Six Decompression and Fusion with Plating and Bonegraft   BLADDER SURGERY     CARDIAC CATHETERIZATION  04/09/2009   EF 60%   CARDIAC CATHETERIZATION  03/24/2001   EF 55%   CARDIAC CATHETERIZATION  01/06/2001   EF 65%   CHOLECYSTECTOMY     CORONARY STENT PLACEMENT  03/2009   STENTING OF THE PROXIMAL TO MID RIGHT CORONARY   IR THORACENTESIS ASP PLEURAL SPACE W/IMG GUIDE  06/01/2018   LEFT HEART CATHETERIZATION WITH CORONARY ANGIOGRAM N/A 12/21/2011   Procedure: LEFT HEART CATHETERIZATION WITH CORONARY ANGIOGRAM;  Surgeon: Peter M Swaziland, MD;  Location[facility] C[facility] LAB;  Service: Cardiovascular;  Laterality: N/A;   LOBECTOMY     NODE DISSECTION N/A 04/04/2018   Procedure: NODE DISSECTION;  Surgeon: Loreli Slot, MD;  Location: [facility] OR;  Service: Thoracic;  Laterality: N/A;   OTHER SURGICAL HISTORY  12/2014   R foot cyst removal   VIDEO ASSISTED THORACOSCOPY (VATS)/ LOBECTOMY Right 04/04/2018    Procedure: VIDEO ASSISTED THORACOSCOPY (VATS)/RIGHT LOWER LOBECTOMY;  Surgeon: Loreli Slot, MD;  Location: [facility] OR;  Service: Thoracic;  Laterality: Right;    Family History  Problem Relation Age of Onset   Aneurysm Mother    Heart attack Father    Heart failure Father    Stroke Sister    Heart attack Brother     Prior to Admission medications   Medication Sig Start Date End Date Taking? Authorizing Provider  pantoprazole (PROTONIX) 40 MG tablet Take 1 tablet (40 mg  total) by mouth daily at 6 (six) AM. 05/17/23  Yes Rodolph Bong, MD  albuterol (VENTOLIN HFA) 108 (90 Base) MCG/ACT inhaler Inhale 2 puffs into the lungs every 6 (six) hours as needed for shortness of breath or wheezing. 02/17/23   [provider]  allopurinol (ZYLOPRIM) 100 MG tablet Take 100 mg by mouth daily.     [provider]  amiodarone (PACERONE) 200 MG tablet Take 1 tablet (200 mg total) by mouth daily. 12/09/22   Duke, Roe Rutherford, PA  apixaban (ELIQUIS) 5 MG TABS tablet Take 1 tablet (5 mg total) by mouth 2 (two) times daily. 12/09/22   Duke, Roe Rutherford, PA  Ascorbic Acid (VITAMIN C PO) Take 1,000 mg by mouth daily.    [provider]  bisoprolol (ZEBETA) 5 MG tablet Take 0.5 tablets (2.5 mg total) by mouth daily. 12/09/22   Duke, Roe Rutherford, PA  busPIRone (BUSPAR) 15 MG tablet Take 15 mg by mouth 2 (two) times daily.    [provider]  Calcium Carbonate-Vitamin D (CALCIUM 600 + D PO) Take 1 tablet by mouth daily.    [provider]  carisoprodol (SOMA) 350 MG tablet Take 1 tablet by mouth 4 (four) times daily as needed for muscle spasms. 04/15/18   [provider]  Cholecalciferol (VITAMIN D) 2000 UNITS tablet Take 4,000 Units by mouth daily.    [provider]  diazepam (VALIUM) 5 MG tablet Take 5 mg by mouth daily.    [provider]  fluticasone (FLONASE) 50 MCG/ACT nasal spray Place 2 sprays into both nostrils daily for 7  days. 05/17/23 05/24/23  Rodolph Bong, MD  gabapentin (NEURONTIN) 300 MG capsule Take 1 capsule (300 mg total) by mouth 2 (two) times daily. 05/16/23   Rodolph Bong, MD  hydrALAZINE (APRESOLINE) 25 MG tablet Take 1 tablet (25 mg total) by mouth every 8 (eight) hours. 05/16/23   Rodolph Bong, MD  ibuprofen (ADVIL) 200 MG tablet Take 200 mg by mouth every 6 (six) hours as needed for headache.    [provider]  Ipratropium-Albuterol (COMBIVENT) 20-100 MCG/ACT AERS respimat Inhale 1 puff into the lungs 2 (two) times daily for 7 days. 05/16/23 05/23/23  Rodolph Bong, MD  isosorbide dinitrate (ISORDIL) 10 MG tablet Take 1 tablet (10 mg total) by mouth 3 (three) times daily. 05/16/23   Rodolph Bong, MD  loratadine (CLARITIN) 10 MG tablet Take 1 tablet (10 mg total) by mouth daily. 05/17/23   Rodolph Bong, MD  Multiple Vitamin (MULTIVITAMIN WITH MINERALS) TABS tablet Take 1 tablet by mouth daily. 08/30/18   Roberto Scales D, MD  nitroGLYCERIN (NITROSTAT) 0.4 MG SL tablet PLACE 1 TABLET UNDER THE TONGUE EVERY 5 MINUTES AS NEEDED CHEST PAIN. Patient taking differently: Place 0.4 mg under the tongue every 5 (five) minutes as needed for chest pain. 05/25/22   [place]il[place], Peter M, MD  oxyCODONE-acetaminophen (PERCOCET) 10-325 MG tablet Take 1 tablet by mouth 4 (four) times daily as needed for pain. 04/13/18   [provider]  rosuvastatin (CRESTOR) 40 MG tablet TAKE 1 TABLET BY MOUTH DAILY 12/09/22   Duke, Roe Rutherford, PA  torsemide (DEMADEX) 20 MG tablet Take 1 tablet (20 mg total) by mouth daily. 12/09/22   Duke, Roe Rutherford, PA  traZODone (DESYREL) 50 MG tablet Take 50 mg by mouth at bedtime as needed for sleep. 01/25/19   [provider]  valACYclovir (VALTREX) 1000 MG tablet Take 500 mg by  mouth daily as needed (for outbreaks).    [provider]  venlafaxine XR (EFFEXOR-XR) 150 MG 24 hr capsule Take 150 mg by mouth daily.    [provider]     Current Facility-Administered Medications  Medication Dose Route Frequency Provider Last Rate Last Admin   0.9 %  sodium chloride infusion (Manually program via Guardrails IV Fluids)   Intravenous Once Kathleen Lime, MD        Allergies as of 07/02/2023 - Review Complete 07/02/2023  Allergen Reaction Noted   Ace inhibitors Other (See Comments) 10/15/2010   Codeine Nausea Only 03/12/2014   Codeine-guaifenesin [guaifenesin-codeine] Nausea Only and Other (See Comments) 10/15/2010    Social History   Socioeconomic History   Marital status: Married    Spouse name: Not on file   Number of children: 2   Years of education: 10   Highest education level: Not on file  Occupational History   Occupation: housewife    Employer: UNEMPLOYED  Tobacco Use   Smoking status: Every Day    Current packs/day: 2.00    Average packs/day: 2.0 packs/day for 35.0 years (70.0 ttl pk-yrs)    Types: Cigarettes   Smokeless tobacco: Never   Tobacco comments:    1-1.5 ppd (10/02/13), 03/17/18 2 PPD  Vaping Use   Vaping status: Never Used  Substance and Sexual Activity   Alcohol use: No    Alcohol/week: 0.0 standard drinks of alcohol   Drug use: No   Sexual activity: Not on file  Other Topics Concern   Not on file  Social History Narrative   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   The patient has a 10th grade education.   The patient is left-handed.   The patient has two children.   Patient drinks three cups of soda daily.       Social Drivers of Corporate investment banker Strain: Low Risk  (05/12/2023)   Received from [facility]   Overall Financial Resource Strain (CARDIA)    Difficulty of Paying Living Expenses: Not hard at all  Food Insecurity: Food Insecurity Present (05/13/2023)   Hunger Vital Sign    Worried About Running Out of Food in the Last Year: Sometimes true    Ran Out of Food in the Last Year: Not on file  Transportation Needs: No Transportation  Needs (05/13/2023)   PRAPARE - Administrator, Civil Service (Medical): No    Lack of Transportation (Non-Medical): No  Physical Activity: Unknown (03/10/2023)   Received from [facility]   Exercise Vital Sign    Days of Exercise per Week: 0 days    Minutes of Exercise per Session: Not on file  Stress: No Stress Concern Present (03/10/2023)   Received from [facility] - Occupational Stress Questionnaire    Feeling of Stress : Not at all  Social Connections: Moderately Isolated (05/13/2023)   Social Connection and Isolation Panel [NHANES]    Frequency of Communication with Friends and Family: More than three times a week    Frequency of Social Gatherings with Friends and Family: Three times a week    Attends Religious Services: Never    Active Member of Clubs or Organizations: No    Attends Banker Meetings: Not on file    Marital Status: Married  Intimate Partner Violence: Not At Risk (05/13/2023)   Humiliation, Afraid, Rape, and Kick questionnaire    Fear  of Current or Ex-Partner: No    Emotionally Abused: No    Physically Abused: No    Sexually Abused: No     Code Status   Code Status: Full Code  Review of Systems: All systems reviewed and negative except where noted in HPI.  Physical Exam: Vital signs in last 24 hours: Temp:  [97.8 F (36.6 C)-98.1 F (36.7 C)] 98.1 F (36.7 C) (04/04 1251) Pulse Rate:  [57-63] 61 (04/04 1251) Resp:  [12-20] 18 (04/04 1251) BP: (132-164)/(56-68) 164/68 (04/04 1251) SpO2:  [98 %-100 %] 100 % (04/04 1251) Weight:  [67.4 kg-68.7 kg] 67.4 kg (04/04 1146)    General:  Pleasant female in NAD Psych:  Cooperative. Normal mood and affect Eyes: Pupils equal Ears:  Normal auditory acuity Nose: No deformity, discharge or lesions Neck:  Supple, no masses felt Lungs:  Diffuse wheezing in chest.  Decreased breath sounds in both lungs   Heart:  Regular rate.  Abdomen:   Soft, nondistended, nontender, active bowel sounds, no masses felt Rectal :  Deferred Msk: Symmetrical without gross deformities.  Neurologic:  Alert, oriented, grossly normal neurologically Extremities : 1-2+ BLE edema  Intake/Output from previous day: No intake/output data recorded. Intake/Output this shift:  No intake/output data recorded.   Willette Cluster, NP-C   07/02/2023, 12:54 PM

## 2023-07-02 NOTE — ED Provider Notes (Signed)
 [place] EMERGENCY DEPARTMENT AT [facility] Provider Note   CSN: [id number] Arrival date & time: 07/02/23  7829     History  Chief Complaint  Patient presents with   Abnormal Lab         Maria Arroyo is a 73 y.o. female.   Abnormal Lab Patient sent in for anemia.  Called last night.  Had visit by PCP for shortness of breath.  Has had recent swelling in her legs.  Is on anticoagulation already.  No blood in the stool.  Found to have hemoglobin of 6.4.  Hemoglobin was 7 3 weeks ago.  Last month while in the hospital it was also 7 and 8.  Although it appears baseline is 10.  No blood in the stool or black stool.  Has worsening swelling in her legs.  Is on chronic oxygen.    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 11/07/2018   COPD (chronic obstructive pulmonary disease) (HCC)    Coronary artery disease    Depression    Gout    R foot and knee   Hyperlipidemia    Hypertension    Left foot drop    Neuropathy    OA (osteoarthritis)    Pica    Tobacco abuse    Past Surgical History:  Procedure Laterality Date   ABDOMINAL HYSTERECTOMY     ANTERIOR CERVICAL DECOMP/DISCECTOMY FUSION  02/06/2011   Procedure: ANTERIOR CERVICAL DECOMPRESSION/DISCECTOMY FUSION 2 LEVELS;  Surgeon: Carmela Hurt;  Location: [facility] NEURO ORS;  Service: Neurosurgery;  Laterality: N/A;  Anterior Cervical Four-Five/Five-Six Decompression and Fusion with Plating and Bonegraft   BLADDER SURGERY     CARDIAC CATHETERIZATION  04/09/2009   EF 60%   CARDIAC CATHETERIZATION  03/24/2001   EF 55%   CARDIAC CATHETERIZATION  01/06/2001   EF 65%   CHOLECYSTECTOMY     CORONARY STENT PLACEMENT  03/2009   STENTING OF THE PROXIMAL TO MID RIGHT CORONARY   IR THORACENTESIS ASP PLEURAL SPACE W/IMG GUIDE  06/01/2018   LEFT HEART CATHETERIZATION WITH CORONARY ANGIOGRAM N/A 12/21/2011   Procedure: LEFT  HEART CATHETERIZATION WITH CORONARY ANGIOGRAM;  Surgeon: Peter M Swaziland, MD;  Location: [facility] CATH LAB;  Service: Cardiovascular;  Laterality: N/A;   LOBECTOMY     NODE DISSECTION N/A 04/04/2018   Procedure: NODE DISSECTION;  Surgeon: Loreli Slot, MD;  Location: [facility] OR;  Service: Thoracic;  Laterality: N/A;   OTHER SURGICAL HISTORY  12/2014   R foot cyst removal   VIDEO ASSISTED THORACOSCOPY (VATS)/ LOBECTOMY Right 04/04/2018   Procedure: VIDEO ASSISTED THORACOSCOPY (VATS)/RIGHT LOWER LOBECTOMY;  Surgeon: Loreli Slot, MD;  Location: [facility] OR;  Service: Thoracic;  Laterality: Right;    Home Medications Prior to Admission medications   Medication Sig Start Date End Date Taking? Authorizing Provider  albuterol (VENTOLIN HFA) 108 (90 Base) MCG/ACT inhaler Inhale 2 puffs into the lungs every 6 (six) hours as needed for shortness of breath or wheezing. 02/17/23  Yes [provider]  allopurinol (ZYLOPRIM) 100 MG tablet Take 100 mg by mouth daily.    Yes [provider]  amiodarone (PACERONE) 200 MG tablet Take 1 tablet (200 mg total) by mouth daily. 12/09/22  Yes Duke, Roe Rutherford, PA  apixaban (ELIQUIS) 5 MG TABS tablet Take 1 tablet (5 mg total) by mouth 2 (two) times daily.  12/09/22  Yes Duke, Roe Rutherford, PA  Ascorbic Acid (VITAMIN C PO) Take 1 tablet by mouth daily.   Yes [provider]  bisoprolol (ZEBETA) 5 MG tablet Take 0.5 tablets (2.5 mg total) by mouth daily. 12/09/22  Yes Duke, Roe Rutherford, PA  busPIRone (BUSPAR) 15 MG tablet Take 15 mg by mouth 2 (two) times daily.   Yes [provider]  Cholecalciferol (VITAMIN D) 2000 UNITS tablet Take 2,000 Units by mouth daily.   Yes [provider]  fluticasone (FLONASE) 50 MCG/ACT nasal spray Place 2 sprays into both nostrils daily for 7 days. 05/17/23 07/02/23 Yes Rodolph Bong, MD  gabapentin (NEURONTIN) 600 MG tablet Take 600 mg by mouth in the morning, at noon, in the evening, and at  bedtime.   Yes [provider]  hydrALAZINE (APRESOLINE) 25 MG tablet Take 1 tablet (25 mg total) by mouth every 8 (eight) hours. 05/16/23  Yes Rodolph Bong, MD  methocarbamol (ROBAXIN) 500 MG tablet Take by mouth 2 (two) times daily. 06/09/23  Yes [provider]  pantoprazole (PROTONIX) 40 MG tablet Take 1 tablet (40 mg total) by mouth daily at 6 (six) AM. 05/17/23  Yes Rodolph Bong, MD  gabapentin (NEURONTIN) 300 MG capsule Take 1 capsule (300 mg total) by mouth 2 (two) times daily. Patient not taking: Reported on 07/02/2023 05/16/23   Rodolph Bong, MD  isosorbide dinitrate (ISORDIL) 10 MG tablet Take 1 tablet (10 mg total) by mouth 3 (three) times daily. 05/16/23   Rodolph Bong, MD  loratadine (CLARITIN) 10 MG tablet Take 1 tablet (10 mg total) by mouth daily. 05/17/23   Rodolph Bong, MD  Multiple Vitamin (MULTIVITAMIN WITH MINERALS) TABS tablet Take 1 tablet by mouth daily. 08/30/18   Roberto Scales D, MD  nitroGLYCERIN (NITROSTAT) 0.4 MG SL tablet PLACE 1 TABLET UNDER THE TONGUE EVERY 5 MINUTES AS NEEDED CHEST PAIN. Patient taking differently: Place 0.4 mg under the tongue every 5 (five) minutes as needed for chest pain. 05/25/22   Swaziland, Peter M, MD  oxyCODONE-acetaminophen (PERCOCET) 10-325 MG tablet Take 1 tablet by mouth 4 (four) times daily as needed for pain. 04/13/18   [provider]  rosuvastatin (CRESTOR) 40 MG tablet TAKE 1 TABLET BY MOUTH DAILY 12/09/22   Duke, Roe Rutherford, PA  torsemide (DEMADEX) 20 MG tablet Take 1 tablet (20 mg total) by mouth daily. 12/09/22   Duke, Roe Rutherford, PA  traZODone (DESYREL) 50 MG tablet Take 50 mg by mouth at bedtime as needed for sleep. 01/25/19   [provider]  valACYclovir (VALTREX) 1000 MG tablet Take 500 mg by mouth daily as needed (for outbreaks).    [provider]  venlafaxine XR (EFFEXOR-XR) 150 MG 24 hr capsule Take 150 mg by mouth daily.    [provider]       Allergies    Ace inhibitors, Codeine, and Codeine-guaifenesin [guaifenesin-codeine]    Review of Systems   Review of Systems  Physical Exam Updated Vital Signs BP (!) 154/65   Pulse 61   Temp 97.7 F (36.5 C) (Oral)   Resp 19   Ht 5\' 1"  (1.549 m)   Wt 67.4 kg   SpO2 100%   BMI 28.08 kg/m  Physical Exam Vitals and nursing note reviewed.  HENT:     Head: Normocephalic.  Abdominal:     Tenderness: There is no abdominal tenderness.  Genitourinary:    Rectum: Guaiac result negative.  Musculoskeletal:  Right lower leg: Edema present.     Left lower leg: Edema present.  Neurological:     Mental Status: She is alert.     ED Results / Procedures / Treatments   Labs (all labs ordered are listed, but only abnormal results are displayed) Labs Reviewed  COMPREHENSIVE METABOLIC PANEL WITH GFR - Abnormal; Notable for the following components:      Result Value   Sodium 128 (*)    Potassium 2.3 (*)    Chloride 72 (*)    CO2 42 (*)    Glucose, Bld 127 (*)    BUN 40 (*)    Creatinine, Ser 2.03 (*)    Calcium 8.8 (*)    Total Protein 6.1 (*)    Albumin 2.9 (*)    GFR, Estimated 26 (*)    All other components within normal limits  BRAIN NATRIURETIC PEPTIDE - Abnormal; Notable for the following components:   B Natriuretic Peptide 1,153.7 (*)    All other components within normal limits  CBC WITH DIFFERENTIAL/PLATELET - Abnormal; Notable for the following components:   RBC 2.38 (*)    Hemoglobin 5.6 (*)    HCT 18.8 (*)    MCV 79.0 (*)    MCH 23.5 (*)    MCHC 29.8 (*)    RDW 17.2 (*)    Neutro Abs 7.9 (*)    All other components within normal limits  IRON AND TIBC - Abnormal; Notable for the following components:   Iron 12 (*)    TIBC 535 (*)    Saturation Ratios 2 (*)    All other components within normal limits  FERRITIN - Abnormal; Notable for the following components:   Ferritin 8 (*)    All other components within normal limits  RETIC PANEL - Abnormal;  Notable for the following components:   RBC. 2.41 (*)    Immature Retic Fract 28.1 (*)    Reticulocyte Hemoglobin 18.3 (*)    All other components within normal limits  PROTIME-INR - Abnormal; Notable for the following components:   Prothrombin Time 17.2 (*)    INR 1.4 (*)    All other components within normal limits  MAGNESIUM  TECHNOLOGIST SMEAR REVIEW  MAGNESIUM  TISSUE TRANSGLUTAMINASE, IGA  IGA  POC OCCULT BLOOD, ED  TYPE AND SCREEN  PREPARE RBC (CROSSMATCH)    EKG EKG Interpretation Date/Time:  Friday July 02 2023 07:23:15 EDT Ventricular Rate:  58 PR Interval:  226 QRS Duration:  201 QT Interval:  595 QTC Calculation: 590 R Axis:   -74  Text Interpretation: Sinus rhythm Prolonged PR interval Left bundle branch block Confirmed by Benjiman Core [phone]) on 07/02/2023 8:36:12 AM  Radiology DG Chest Portable 1 View Result Date: 07/02/2023 CLINICAL DATA:  73 year old female with shortness of breath. EXAM: PORTABLE CHEST 1 VIEW COMPARISON:  Chest radiographs 05/12/2023 and earlier. FINDINGS: Portable AP semi upright view at 0730 hours. Ongoing right pleural effusion, small to moderate. More conspicuous fluid in the right minor fissure now. Underlying cardiomegaly. Stable cardiac size and mediastinal contours. Visualized tracheal air column is within normal limits. Stable pulmonary vascularity, mild interstitial edema is possible. No consolidation. But centrilobular emphysema confirmed on a 2023 chest CT. Cervical ACDF. Degenerative changes in the spine. Negative visible bowel gas pattern. IMPRESSION: Ongoing right pleural effusion, small to moderate. Chronic cardiomegaly. Emphysema (ICD10-J43.9). Mild interstitial edema is possible. Electronically Signed   By: Odessa Fleming M.D.   On: 07/02/2023 07:40    Procedures Procedures  Medications Ordered in ED Medications  0.9 %  sodium chloride infusion (Manually program via Guardrails IV Fluids) (has no administration in time range)   pantoprazole (PROTONIX) EC tablet 40 mg (40 mg Oral Given 07/02/23 1438)  revefenacin (YUPELRI) nebulizer solution 175 mcg (has no administration in time range)  arformoterol (BROVANA) nebulizer solution 15 mcg (has no administration in time range)  ipratropium-albuterol (DUONEB) 0.5-2.5 (3) MG/3ML nebulizer solution 3 mL (has no administration in time range)  potassium chloride SA (KLOR-CON M) CR tablet 40 mEq (40 mEq Oral Given 07/02/23 1437)  bisoprolol (ZEBETA) tablet 2.5 mg (2.5 mg Oral Given 07/02/23 1438)  amiodarone (PACERONE) tablet 200 mg (200 mg Oral Given 07/02/23 1437)  allopurinol (ZYLOPRIM) tablet 100 mg (100 mg Oral Given 07/02/23 1438)  busPIRone (BUSPAR) tablet 15 mg (15 mg Oral Given 07/02/23 1437)  gabapentin (NEURONTIN) capsule 300 mg (300 mg Oral Given 07/02/23 1437)  rosuvastatin (CRESTOR) tablet 10 mg (10 mg Oral Given 07/02/23 1438)  venlafaxine XR (EFFEXOR-XR) 24 hr capsule 150 mg (has no administration in time range)  potassium chloride 10 mEq in 100 mL IVPB (0 mEq Intravenous Stopped 07/02/23 1224)  furosemide (LASIX) injection 60 mg (60 mg Intravenous Given 07/02/23 1437)    ED Course/ Medical Decision Making/ A&P                                 Medical Decision Making Amount and/or Complexity of Data Reviewed Labs: ordered. Radiology: ordered.  Risk Prescription drug management. Decision regarding hospitalization.   Patient with shortness of breath.  Sent in with anemia.  Hemoglobin down to 5.6.  Guaiac negative.  Differential diagnosis does include various causes of anemia including chronic disease.  Is mildly microcytic.  Does have edema on her legs.  Will admit to hospital.  X-ray does show potential volume overload.  Also does have worsening swelling on her legs.  CRITICAL CARE Performed by: Benjiman Core Total critical care time: 30 minutes Critical care time was exclusive of separately billable procedures and treating other patients. Critical care was  necessary to treat or prevent imminent or life-threatening deterioration. Critical care was time spent personally by me on the following activities: development of treatment plan with patient and/or surrogate as well as nursing, discussions with consultants, evaluation of patient's response to treatment, examination of patient, obtaining history from patient or surrogate, ordering and performing treatments and interventions, ordering and review of laboratory studies, ordering and review of radiographic studies, pulse oximetry and re-evaluation of patient's condition.         Final Clinical Impression(s) / ED Diagnoses Final diagnoses:  Symptomatic anemia    Rx / DC Orders ED Discharge Orders     None         Benjiman Core, MD 07/02/23 1505

## 2023-07-02 NOTE — ED Triage Notes (Signed)
 Patient arrives via [facility] from home after PCP advised her to come to ED for low hemoglobin. Patient complaining of feet swelling and shortness of breath with exertion. Denies blood in stools.

## 2023-07-02 NOTE — Hospital Course (Addendum)
 Acute on chronic anemia  Patient presented to the ED after having labs checked at her PCP's office which showed hemoglobin of 6.4. In the ED hemoglobin had dropped to 5.6.  She endorsed worsening weakness for the past several weeks and some dizziness with ambulation.  Patient received 1 unit of PRBCs with improvement of her hemoglobin.  GI was consulted and EGD and colonoscopy were performed, neither of which showed any evidence of acute bleeding. Capsule endoscopy likewise did not show evidence of GI bleeding.  Patient required 2 units of PRBCs total during this hospital admission, after which hemoglobin remained stable.  On 07/06/23 GI cleared patient for hospital discharge with follow-up with her gastroenterologist Dr Jason Fila as well as repeat colonoscopy in the future. She should continue her PPI .  PT recommended home health physical therapy.  Metabolic alkalosis Labs showed increase in bicarb to 43.  ABG confirmed hypercarbic metabolic alkalosis with respiratory compensation.  Unsure etiology of electrolyte derangements, could be related to chronic hypercapnic respiratory failure related to her COPD.  As labs were trending in a positive direction at the time of discharge, patient could be discharged with PCP follow-up.  Acute on chronic back, hip, and leg pain On arrival to the ED, patient endorsed worsening of her chronic low back, right hip, and posterior leg pain.  She did have some improvement of her leg pain with blood and IV iron transfusion, which may indicate an element of restless leg syndrome contributing to her pain.  Pain had improved at time of discharge, though will likely need further outpatient follow-up  Paroxysmal atrial fibrillation Telemetry showed sinus bradycardia during admission. Amiodarone was continued, home bisoprolol was discontinued due to bradycardia. Eliquis was restarted at discharge.   Chronic hypoxic respiratory failure HFpEF   COPD Patient uses 3L of supplemental  oxygen via nasal cannula at home. In the ED there was  concern for acute heart failure exacerbation, though she was able to maintain appropriate O2 sats with her home oxygen requirement. She received IV Lasix x 1 with no further diuresis during this admission.  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

## 2023-07-03 DIAGNOSIS — D649 Anemia, unspecified: Secondary | ICD-10-CM | POA: Diagnosis not present

## 2023-07-03 DIAGNOSIS — L8990 Pressure ulcer of unspecified site, unspecified stage: Secondary | ICD-10-CM | POA: Insufficient documentation

## 2023-07-03 DIAGNOSIS — F17200 Nicotine dependence, unspecified, uncomplicated: Secondary | ICD-10-CM | POA: Insufficient documentation

## 2023-07-03 DIAGNOSIS — Z9981 Dependence on supplemental oxygen: Secondary | ICD-10-CM | POA: Diagnosis not present

## 2023-07-03 DIAGNOSIS — I4891 Unspecified atrial fibrillation: Secondary | ICD-10-CM | POA: Diagnosis not present

## 2023-07-03 DIAGNOSIS — D509 Iron deficiency anemia, unspecified: Secondary | ICD-10-CM | POA: Diagnosis not present

## 2023-07-03 DIAGNOSIS — J449 Chronic obstructive pulmonary disease, unspecified: Secondary | ICD-10-CM | POA: Diagnosis not present

## 2023-07-03 DIAGNOSIS — Z515 Encounter for palliative care: Secondary | ICD-10-CM | POA: Diagnosis not present

## 2023-07-03 DIAGNOSIS — G609 Hereditary and idiopathic neuropathy, unspecified: Secondary | ICD-10-CM | POA: Insufficient documentation

## 2023-07-03 LAB — CBC
HCT: 26.1 % — ABNORMAL LOW (ref 36.0–46.0)
Hemoglobin: 8 g/dL — ABNORMAL LOW (ref 12.0–15.0)
MCH: 24.2 pg — ABNORMAL LOW (ref 26.0–34.0)
MCHC: 30.7 g/dL (ref 30.0–36.0)
MCV: 78.9 fL — ABNORMAL LOW (ref 80.0–100.0)
Platelets: 369 10*3/uL (ref 150–400)
RBC: 3.31 MIL/uL — ABNORMAL LOW (ref 3.87–5.11)
RDW: 17.2 % — ABNORMAL HIGH (ref 11.5–15.5)
WBC: 11.6 10*3/uL — ABNORMAL HIGH (ref 4.0–10.5)
nRBC: 0 % (ref 0.0–0.2)

## 2023-07-03 LAB — BASIC METABOLIC PANEL WITH GFR
BUN: 31 mg/dL — ABNORMAL HIGH (ref 8–23)
CO2: >45 mmol/L — ABNORMAL HIGH (ref 22–32)
Calcium: 9.4 mg/dL (ref 8.9–10.3)
Chloride: 76 mmol/L — ABNORMAL LOW (ref 98–111)
Creatinine, Ser: 1.88 mg/dL — ABNORMAL HIGH (ref 0.44–1.00)
GFR, Estimated: 28 mL/min — ABNORMAL LOW (ref >60)
Glucose, Bld: 125 mg/dL — ABNORMAL HIGH (ref 70–99)
Potassium: 3.3 mmol/L — ABNORMAL LOW (ref 3.5–5.1)
Sodium: 134 mmol/L — ABNORMAL LOW (ref 135–145)

## 2023-07-03 LAB — PHOSPHORUS: Phosphorus: 4.2 mg/dL (ref 2.5–4.6)

## 2023-07-03 MED ORDER — CAPSAICIN 0.025 % EX CREA
TOPICAL_CREAM | Freq: Two times a day (BID) | CUTANEOUS | Status: DC
  Administered 2023-07-03: 1 via TOPICAL
  Filled 2023-07-03: qty 60

## 2023-07-03 MED ORDER — HYDROMORPHONE HCL 2 MG PO TABS
1.0000 mg | ORAL_TABLET | Freq: Once | ORAL | Status: AC
  Administered 2023-07-03: 1 mg via ORAL
  Filled 2023-07-03: qty 1

## 2023-07-03 MED ORDER — ACETAMINOPHEN 325 MG PO TABS
650.0000 mg | ORAL_TABLET | Freq: Four times a day (QID) | ORAL | Status: DC | PRN
  Administered 2023-07-03: 650 mg via ORAL
  Filled 2023-07-03: qty 2

## 2023-07-03 MED ORDER — IRON SUCROSE 200 MG IVPB - SIMPLE MED
200.0000 mg | Freq: Every day | Status: DC
  Administered 2023-07-03: 200 mg via INTRAVENOUS
  Filled 2023-07-03 (×2): qty 110

## 2023-07-03 NOTE — Evaluation (Signed)
 Physical Therapy Evaluation Patient Details Name: Maria Arroyo MRN: [id number] DOB: 10-23-1950 Today's Date: 07/03/2023  History of Present Illness  73 y/o female presented 07/02/23 with SOB for the past several days and found to have an acute COPD exacerbation on arrival.  Her respiratory panel is positive for COVID-19 though fortunately chest x-ray not showing any focal pneumonia. 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 Pt reports ambulation with RW at home and Rollator in community. Pt niece's assists PRN with ADLs and iADLs.  Pt is currently limited by back and radiating LE pain R>L, Pt requires maxA for coming to sitting EoB, screaming out in pain. Pt reports increased pain and feeling "weird" however BP and HR in line with supine. Pt requests to return to supine. Pt requires maxA to achieve. PT recommending HHPT at this time, however if pt unable to progress mobility may need higher level of rehab services. PT will continue to follow acutely.      If plan is discharge home, recommend the following: A little help with walking and/or transfers;A little help with bathing/dressing/bathroom;Assistance with cooking/housework;Direct supervision/assist for medications management;Direct supervision/assist for financial management;Assist for transportation;Help with stairs or ramp for entrance;Supervision due to cognitive status   Can travel by private vehicle    Yes    Equipment Recommendations None recommended by PT     Functional Status Assessment Patient has had a recent decline in their functional status and demonstrates the ability to make significant improvements in  function in a reasonable and predictable amount of time.     Precautions / Restrictions Precautions Precautions: Fall Restrictions Weight Bearing Restrictions Per Provider Order: No      Mobility  Bed Mobility Overal bed mobility: Needs Assistance Bed Mobility: Supine to Sit, Sit to Supine     Supine to sit: Max assist, HOB elevated, Used rails Sit to supine: Max assist, HOB elevated, Used rails   General bed mobility comments: maxA for assist with coming to EoB due to pain radiating LE, R LE >LLE pain, has lightheadedness and returned to supine with maxA    Transfers                   General transfer comment: deferred to back and B LE pain as well as lightheadedness        Balance Overall balance assessment: Needs assistance Sitting-balance support: Feet supported, Bilateral upper extremity supported Sitting balance-Leahy Scale: Fair Sitting balance - Comments: able to sit EoB but limited by pain                                     Pertinent Vitals/Pain Pain Assessment Pain Assessment: 0-10 Pain Score: 8  Pain Location: R hip radiating down leg to foot Pain Descriptors / Indicators: Grimacing, Guarding, Crying, Stabbing Pain Intervention(s): Limited activity within patient's tolerance, Monitored during session, Repositioned, Heat applied    Home Living Family/patient expects to be discharged to:: Private residence Living Arrangements: Spouse/significant other Available Help at Discharge: Family Type of Home: House Home Access: Stairs to enter Entrance Stairs-Rails: Can reach both Entrance Stairs-Number of Steps: 5  Home Layout: One level Home Equipment: Agricultural consultant (2 wheels);Cane - single point;Grab bars - tub/shower;Shower seat;BSC/3in1;Rollator (4 wheels) Additional Comments: niece is available    Prior Function Prior Level of Function : Independent/Modified Independent             Mobility Comments: walks with RW at  home, uses Rollator when going to appointments ADLs Comments: neice assists with iADLs and ADLs as needed     Extremity/Trunk Assessment   Upper Extremity Assessment Upper Extremity Assessment: Defer to OT evaluation    Lower Extremity Assessment Lower Extremity Assessment: RLE deficits/detail;LLE deficits/detail RLE Deficits / Details: pain with all movement of leg, screams out in pain RLE: Unable to fully assess due to pain LLE Deficits / Details: pain with all movement of leg, screams out in pain LLE: Unable to fully assess due to pain    Cervical / Trunk Assessment Cervical / Trunk Assessment: Kyphotic  Communication   Communication Communication: No apparent difficulties    Cognition Arousal: Alert Behavior During Therapy: Flat affect   PT - Cognitive impairments: No apparent impairments                         Following commands: Impaired, Intact       Cueing Cueing Techniques: Verbal cues, Tactile cues, Visual cues     General Comments General comments (skin integrity, edema, etc.): HR in 40s throughout session BP in supine 116/59, sitting 127/55        Assessment/Plan    PT Assessment Patient needs continued PT services  PT Problem List Decreased strength;Decreased range of motion;Decreased balance;Decreased mobility;Decreased activity tolerance;Decreased coordination;Decreased cognition;Decreased knowledge of use of DME;Decreased safety awareness;Cardiopulmonary status limiting activity;Pain       PT Treatment Interventions DME instruction;Gait training;Stair training;Functional mobility training;Therapeutic activities;Therapeutic exercise;Balance training;Cognitive remediation;Patient/family education    PT Goals (Current goals can be found in the Care Plan section)  Acute Rehab PT Goals PT Goal Formulation: With patient/family Time For Goal Achievement: 07/17/23 Potential to Achieve Goals: Fair    Frequency Min 2X/week        AM-PAC  PT "6 Clicks" Mobility  Outcome Measure Help needed turning from your back to your side while in a flat bed without using bedrails?: A Lot Help needed moving from lying on your back to sitting on the side of a flat bed without using bedrails?: Total Help needed moving to and from a bed to a chair (including a wheelchair)?: Total Help needed standing up from a chair using your arms (e.g., wheelchair or bedside chair)?: Total Help needed to walk in hospital room?: Total Help needed climbing 3-5 steps with a railing? : Total 6 Click Score: 7    End of Session Equipment Utilized During Treatment: Oxygen Activity Tolerance: Patient limited by pain Patient left: in bed;with call bell/phone within reach;with bed alarm set Nurse Communication: Mobility status;Patient requests pain meds PT Visit Diagnosis: Pain;Muscle weakness (generalized) (M62.81);Difficulty in walking, not elsewhere classified (R26.2);Unsteadiness on feet (R26.81);Other symptoms and signs involving the nervous system (R29.898) Pain - Right/Left:  (bilateral LE) Pain - part of body: Hip    Time: 0865-7846 PT Time Calculation (min) (ACUTE ONLY): 22 min   Charges:   PT Evaluation $PT Eval Moderate Complexity: 1 Mod   PT General Charges $$ ACUTE PT VISIT: 1 Visit         Meryn Sarracino B. Beverely Risen PT, DPT Acute Rehabilitation Services Please use secure chat or  Call Office [phone])  [phone]   Maria Arroyo Fleet 07/03/2023, 4:09 PM

## 2023-07-03 NOTE — Progress Notes (Signed)
      Progress Note   Subjective  Patient without complaints. Family at bedside today.    Objective   Vital signs in last 24 hours: Temp:  [97.7 F (36.5 C)-98.2 F (36.8 C)] 97.7 F (36.5 C) (04/05 1148) Pulse Rate:  [52-60] 60 (04/05 0330) Resp:  [10-21] 21 (04/05 0330) BP: (151-163)/(62-118) 153/67 (04/05 1148) SpO2:  [65 %-100 %] 100 % (04/05 0805) Weight:  [67.4 kg] 67.4 kg (04/05 0330) Last BM Date :  (PTA) General:    white female in NAD Neurologic:  Alert and oriented,  grossly normal neurologically. Psych:  Cooperative. Normal mood and affect.  Intake/Output from previous day: 04/04 0701 - 04/05 0700 In: 965 [P.O.:600; Blood:365] Out: 3600 [Urine:3600] Intake/Output this shift: Total I/O In: 110 [IV Piggyback:110] Out: 600 [Urine:600]  Lab Results: Recent Labs    07/02/23 0712 07/02/23 2013 07/03/23 0458  WBC 9.7 9.6 11.6*  HGB 5.6* 7.2* 8.0*  HCT 18.8* 23.3* 26.1*  PLT 345 339 369   BMET Recent Labs    07/02/23 0712 07/03/23 0458  NA 128* 134*  K 2.3* 3.3*  CL 72* 76*  CO2 42* >45*  GLUCOSE 127* 125*  BUN 40* 31*  CREATININE 2.03* 1.88*  CALCIUM 8.8* 9.4   LFT Recent Labs    07/02/23 0712  PROT 6.1*  ALBUMIN 2.9*  AST 38  ALT 37  ALKPHOS 90  BILITOT 0.5   PT/INR Recent Labs    07/02/23 1216  LABPROT 17.2*  INR 1.4*    Studies/Results: DG Chest Portable 1 View Result Date: 07/02/2023 CLINICAL DATA:  73 year old female with shortness of breath. EXAM: PORTABLE CHEST 1 VIEW COMPARISON:  Chest radiographs 05/12/2023 and earlier. FINDINGS: Portable AP semi upright view at 0730 hours. Ongoing right pleural effusion, small to moderate. More conspicuous fluid in the right minor fissure now. Underlying cardiomegaly. Stable cardiac size and mediastinal contours. Visualized tracheal air column is within normal limits. Stable pulmonary vascularity, mild interstitial edema is possible. No consolidation. But centrilobular emphysema confirmed  on a 2023 chest CT. Cervical ACDF. Degenerative changes in the spine. Negative visible bowel gas pattern. IMPRESSION: Ongoing right pleural effusion, small to moderate. Chronic cardiomegaly. Emphysema (ICD10-J43.9). Mild interstitial edema is possible. Electronically Signed   By: Odessa Fleming M.D.   On: 07/02/2023 07:40       Assessment / Plan:    73 y/o female here with the following:  Symptomatic anemia Iron deficiency anemia AF on Eliquis COPD - supplemental oxygen  She is stable without any overt bleeding. Husband at bedside and daughter. Discussed recommendations for EGD and colonoscopy to evaluate her iron deficiency anemia in the setting of anticoagulation. I have discussed risks / benefits of the exams and anesthesia with her. She is higher than average risk with her respiratory status, but is stable, and wishes to proceed. Clear liquid diet today, bowel prep this evening - split dose, procedure scheduled for tomorrow AM. Continue to hold Eliquis. Call with questions.  Harlin Rain, MD [facility]

## 2023-07-03 NOTE — Progress Notes (Signed)
 Interval history She continues to have difficulty "catching her breath," but feels it is easier to breathe today. She continues to have b/l foot pain but suspect this is chronic. Her LE edema has improved. Denies blood in stool, epistaxis, chest pain or abdominal pain.  Received 1 U of RBCs yesterday, holding Eliquis. Colonoscopy/EGD scheduled tomorrow per GI   Physical exam Blood pressure (!) 163/62, pulse 60, temperature 98 F (36.7 C), temperature source Oral, resp. rate (!) 21, height 5\' 1"  (1.549 m), weight 67.4 kg, SpO2 100%.  General appearance: sitting up in bed, alert, cooperative Resp: normal work of breathing, no wheezes, lung sounds clear Cardio: regular rate and rhythm Abd: no tenderness or guarding Extremities: no LE edema , b/l feet cool to touch   Weight change: -1.296 kg   Intake/Output Summary (Last 24 hours) at 07/03/2023 0848 Last data filed at 07/03/2023 1610 Gross per 24 hour  Intake 965 ml  Output 3600 ml  Net -2635 ml   Net IO Since Admission: -2,635 mL [07/03/23 0848]  Labs, images, and other studies    Latest Ref Rng & Units 07/03/2023    4:58 AM 07/02/2023    8:13 PM 07/02/2023    7:12 AM  CBC  WBC 4.0 - 10.5 K/uL 11.6  9.6  9.7   Hemoglobin 12.0 - 15.0 g/dL 8.0  7.2  5.6   Hematocrit 36.0 - 46.0 % 26.1  23.3  18.8   Platelets 150 - 400 K/uL 369  339  345       Latest Ref Rng & Units 07/03/2023    4:58 AM 07/02/2023    7:12 AM 05/16/2023    2:59 AM  BMP  Glucose 70 - 99 mg/dL 960  454  098   BUN 8 - 23 mg/dL 31  40  49   Creatinine 0.44 - 1.00 mg/dL 1.19  1.47  8.29   Sodium 135 - 145 mmol/L 134  128  135   Potassium 3.5 - 5.1 mmol/L 3.3  2.3  4.3   Chloride 98 - 111 mmol/L 76  72  92   CO2 22 - 32 mmol/L >45  42  31   Calcium 8.9 - 10.3 mg/dL 9.4  8.8  8.9     Component Ref Range & Units (hover) 07:12 4 yr ago  Iron 12 Low  9 Low   TIBC 535 High  424  Saturation Ratios 2 Low  2 Low   UIBC 523 415     Component Ref Range  & Units (hover) 07:12 4 yr ago  Ferritin 8 Low  8 Low              Component Ref Range & Units (hover) 07:12 4 yr ago  Retic Ct Pct 2.5 1.7  RBC. 2.41 Low  3.03 Low   Retic Count, Absolute 61.0 52.7  Immature Retic Fract 28.1 High  29.9 High  CM  Reticulocyte Hemoglobin 18.3 Low      Component Ref Range & Units (hover) 1 d ago 5 yr ago 11 yr ago  Prothrombin Time 17.2 High  12.5 12.1 R  INR 1.4 High  0.94 R, CM 0.89 R, CM    Magnesium 2.4  Assessment and plan Hospital day 1  Maria Arroyo is a 73 y.o.  with a PMH of COPD on 3L [place], paroxysmal A-fib, chronic diastolic CHF, hypertension, squamous cell cancer  of right lung status post lobectomy in 2020 who presented to the ED for leg swelling and pain found to have acute on chronic anemia.   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 *  #Acute on chronic anemia Improving. Hgb of 5.6 --> 8 after 1U RBCs. Unclear what the source of bleeding is but it seems to have stopped. She is still iron deficient and will need IV iron. She continues to endorse dyspnea. Denies any signs of bleeding, no hematemesis or melana. EGD/colonoscopy scheduled on 4/6 will assess for any signs of active bleeding. - Iron sucrose 200 IV q24 for 3 doses - EGD and colonoscopy on 4/6 - Continue Protonix  - Holding Eliquis  - Celiac studies ordered by GI - NPO at midnight    #Acute on chronic hypoxic respiratory failure #HFpEF   #R pleural effusion   Improved. 1/Os net loss of 2.6L and her LE edema has resolved, however she continues to endorse dyspnea, we will continue to diurese her. Suspected this was a HF exacerbation 2/2 to acute on chronic anemia.  - Daily weights - Strict I/os - Continuous pulse ox while ambulating - Incentive spirometry - IV lasix 60mg     #COPD (chronic obstructive pulmonary disease) (HCC) Wheezing improved today.  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 Magnesium 2.4. -  Phosphorous ordered - Tele monitoring - Continue amiodorne - Holding Eliquis  - Klor Con PO BID  Peripheral neruopathy We think her leg pain is chronic 2/2 neuropathy for diabetes, likely worsened by LE edema for the past 3 weeks or so. A1c one month ago was 7.0. - Gabapentin 300mg  for b/l foot pain     #CKD4 - No active concerns. Cr improving. - Trend Cr    #HTN - hold home meds    #Chronic pain #Osteoarthritis Hold home oxycodone   VTE prophylaxis: SCDs Diet: NPO IVF: NS Code: Full PT/OT recommendations: None Family Update: Husband  Discharge plan: Ongoing medical work up    This is a Psychologist, occupational Note.  The care of the patient was discussed with Dr. Mickie Bail  and the assessment and plan was formulated with their assistance.  Please see their note for official documentation of the patient encounter.   Signed: Rosana Fret, Medical Student 07/03/2023, 8:47 AM

## 2023-07-03 NOTE — Plan of Care (Signed)
  Problem: Clinical Measurements: Goal: Will remain free from infection Outcome: Progressing Goal: Respiratory complications will improve Outcome: Progressing Goal: Cardiovascular complication will be avoided Outcome: Progressing   Problem: Nutrition: Goal: Adequate nutrition will be maintained Outcome: Progressing   Problem: Elimination: Goal: Will not experience complications related to urinary retention Outcome: Progressing   Problem: Safety: Goal: Ability to remain free from injury will improve Outcome: Progressing

## 2023-07-03 NOTE — Progress Notes (Signed)
 Received call from central telemetry that patient's oxygen saturations were reading low. RN immediately went to room to assess patient and found patient to have oxygen saturations 67% on room air with a good pleth. Patient was awake and alert but did endorse shortness of breath. Patient was placed back on 4LNC, and oxygen saturations improved to 95%. Patient's oxygen was then turned down to 3L, which she states is her home dose.

## 2023-07-03 NOTE — Progress Notes (Signed)
 Mobility Specialist Progress Note;   07/03/23 1220  Mobility  Activity Ambulated with assistance in room  Level of Assistance Minimal assist, patient does 75% or more  Assistive Device Front wheel walker  Distance Ambulated (ft) 30 ft  Activity Response Tolerated fair  Mobility Referral Yes  Mobility visit 1 Mobility  Mobility Specialist Start Time (ACUTE ONLY) 1220  Mobility Specialist Stop Time (ACUTE ONLY) 1235  Mobility Specialist Time Calculation (min) (ACUTE ONLY) 15 min   RN requesting MS to ambulate with pt, pt agreeable. On 3LO2 upon arrival. Required MinA for bed mobility and MinA for safe ambulation as pts knees would often buckle. Pt states this is d/t weakness. Ambulated on 3LO2, SPO2 desat once requiring a seated rest break and encouraged PLB. Only c/o pain in pts back running down into her legs. Pt able to safely return back to bed with all needs met, alarm on. Husband in room to assist.   Caesar Bookman Mobility Specialist Please contact via SecureChat or Rehab Office [phone]

## 2023-07-03 NOTE — H&P (View-Only) (Signed)
      Progress Note   Subjective  Patient without complaints. Family at bedside today.    Objective   Vital signs in last 24 hours: Temp:  [97.7 F (36.5 C)-98.2 F (36.8 C)] 97.7 F (36.5 C) (04/05 1148) Pulse Rate:  [52-60] 60 (04/05 0330) Resp:  [10-21] 21 (04/05 0330) BP: (151-163)/(62-118) 153/67 (04/05 1148) SpO2:  [65 %-100 %] 100 % (04/05 0805) Weight:  [67.4 kg] 67.4 kg (04/05 0330) Last BM Date :  (PTA) General:    white female in NAD Neurologic:  Alert and oriented,  grossly normal neurologically. Psych:  Cooperative. Normal mood and affect.  Intake/Output from previous day: 04/04 0701 - 04/05 0700 In: 965 [P.O.:600; Blood:365] Out: 3600 [Urine:3600] Intake/Output this shift: Total I/O In: 110 [IV Piggyback:110] Out: 600 [Urine:600]  Lab Results: Recent Labs    07/02/23 0712 07/02/23 2013 07/03/23 0458  WBC 9.7 9.6 11.6*  HGB 5.6* 7.2* 8.0*  HCT 18.8* 23.3* 26.1*  PLT 345 339 369   BMET Recent Labs    07/02/23 0712 07/03/23 0458  NA 128* 134*  K 2.3* 3.3*  CL 72* 76*  CO2 42* >45*  GLUCOSE 127* 125*  BUN 40* 31*  CREATININE 2.03* 1.88*  CALCIUM 8.8* 9.4   LFT Recent Labs    07/02/23 0712  PROT 6.1*  ALBUMIN 2.9*  AST 38  ALT 37  ALKPHOS 90  BILITOT 0.5   PT/INR Recent Labs    07/02/23 1216  LABPROT 17.2*  INR 1.4*    Studies/Results: DG Chest Portable 1 View Result Date: 07/02/2023 CLINICAL DATA:  73 year old female with shortness of breath. EXAM: PORTABLE CHEST 1 VIEW COMPARISON:  Chest radiographs 05/12/2023 and earlier. FINDINGS: Portable AP semi upright view at 0730 hours. Ongoing right pleural effusion, small to moderate. More conspicuous fluid in the right minor fissure now. Underlying cardiomegaly. Stable cardiac size and mediastinal contours. Visualized tracheal air column is within normal limits. Stable pulmonary vascularity, mild interstitial edema is possible. No consolidation. But centrilobular emphysema confirmed  on a 2023 chest CT. Cervical ACDF. Degenerative changes in the spine. Negative visible bowel gas pattern. IMPRESSION: Ongoing right pleural effusion, small to moderate. Chronic cardiomegaly. Emphysema (ICD10-J43.9). Mild interstitial edema is possible. Electronically Signed   By: Odessa Fleming M.D.   On: 07/02/2023 07:40       Assessment / Plan:    73 y/o female here with the following:  Symptomatic anemia Iron deficiency anemia AF on Eliquis COPD - supplemental oxygen  She is stable without any overt bleeding. Husband at bedside and daughter. Discussed recommendations for EGD and colonoscopy to evaluate her iron deficiency anemia in the setting of anticoagulation. I have discussed risks / benefits of the exams and anesthesia with her. She is higher than average risk with her respiratory status, but is stable, and wishes to proceed. Clear liquid diet today, bowel prep this evening - split dose, procedure scheduled for tomorrow AM. Continue to hold Eliquis. Call with questions.  Harlin Rain, MD [facility]

## 2023-07-03 NOTE — Consult Note (Signed)
 Palliative Medicine  Name: Maria Arroyo Date: 07/03/2023 MRN: [id number]  DOB: 03-28-1951  Patient Care Team: Lanell Persons, MD as PCP - General (Family Medicine) [place], Peter M, MD as PCP - Cardiology (Cardiology)    REASON FOR CONSULTATION: Maria Arroyo is a 73 y.o. 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 Patient hospitalized 05/12/23-05/16/23 with COVID/COPD. Now admitted 07/02/22 with symptomatic anemia. Patient pending GI workup. Palliative care consulted to address goals.    SOCIAL HISTORY:     reports that she has been smoking cigarettes. She has a 70 pack-year smoking history. She has never used smokeless tobacco. She reports that she does not drink alcohol and does not use drugs.  Patient is married and lives at home with husband. She has a son and daughter who live nearby. Patient previously worked as a Engineer, structural.   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 11/07/2018   COPD (chronic obstructive pulmonary disease) (HCC)    Coronary artery disease    Depression    Gout    R foot and knee   Hyperlipidemia    Hypertension    Left foot drop    Neuropathy    OA (osteoarthritis)    Pica    Tobacco abuse     PAST SURGICAL HISTORY:  Past Surgical History:  Procedure Laterality Date   ABDOMINAL HYSTERECTOMY     ANTERIOR CERVICAL DECOMP/DISCECTOMY FUSION  02/06/2011   Procedure: ANTERIOR CERVICAL DECOMPRESSION/DISCECTOMY FUSION 2 LEVELS;  Surgeon: Carmela Hurt;  Location: [facility];  Service: Neurosurgery;  Laterality: N/A;  Anterior Cervical Four-Five/Five-Six Decompression and Fusion with Plating and Bonegraft   BLADDER SURGERY     CARDIAC  CATHETERIZATION  04/09/2009   EF 60%   CARDIAC CATHETERIZATION  03/24/2001   EF 55%   CARDIAC CATHETERIZATION  01/06/2001   EF 65%   CHOLECYSTECTOMY     CORONARY STENT PLACEMENT  03/2009   STENTING OF THE PROXIMAL TO MID RIGHT CORONARY   IR THORACENTESIS ASP PLEURAL SPACE W/IMG GUIDE  06/01/2018   LEFT HEART CATHETERIZATION WITH CORONARY ANGIOGRAM N/A 12/21/2011   Procedure: LEFT HEART CATHETERIZATION WITH CORONARY ANGIOGRAM;  Surgeon: Peter M [place], MD;  Location: [facility] CATH LAB;  Service: Cardiovascular;  Laterality: N/A;   LOBECTOMY     NODE DISSECTION N/A 04/04/2018   Procedure: NODE DISSECTION;  Surgeon: Loreli Slot, MD;  Location: [facility] OR;  Service: Thoracic;  Laterality: N/A;   OTHER SURGICAL HISTORY  12/2014   R foot cyst removal   VIDEO ASSISTED THORACOSCOPY (VATS)/ LOBECTOMY Right 04/04/2018   Procedure: VIDEO ASSISTED THORACOSCOPY (VATS)/RIGHT LOWER LOBECTOMY;  Surgeon: Loreli Slot, MD;  Location: [facility] OR;  Service: Thoracic;  Laterality: Right;    HEMATOLOGY/ONCOLOGY HISTORY:  Oncology History  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  MEDICATIONS:  Current Facility-Administered Medications  Medication Dose Route Frequency Provider Last Rate Last Admin   0.9 %  sodium chloride infusion (Manually program via Guardrails IV Fluids)   Intravenous Once Kathleen Lime, MD       acetaminophen (TYLENOL) tablet 650 mg  650 mg Oral Q6H PRN Kathleen Lime, MD   650 mg at 07/03/23 1143   allopurinol (ZYLOPRIM) tablet 100 mg  100 mg Oral Daily Marrianne Mood, MD   100 mg at 07/03/23 0815   amiodarone (PACERONE) tablet 200 mg  200  mg Oral Daily Marrianne Mood, MD   200 mg at 07/03/23 0815   arformoterol (BROVANA) nebulizer solution 15 mcg  15 mcg Nebulization BID Marrianne Mood, MD   15 mcg at 07/03/23 0843   bisoprolol (ZEBETA) tablet 2.5 mg  2.5 mg Oral Daily Marrianne Mood, MD   2.5 mg at 07/03/23 9528   busPIRone (BUSPAR) tablet 15 mg  15 mg Oral BID Marrianne Mood, MD   15 mg at 07/03/23 0815   gabapentin (NEURONTIN) capsule 300 mg  300 mg Oral BID Marrianne Mood, MD   300 mg at 07/03/23 0815   ipratropium-albuterol (DUONEB) 0.5-2.5 (3) MG/3ML nebulizer solution 3 mL  3 mL Nebulization Q4H PRN Marrianne Mood, MD       iron sucrose (VENOFER) 200 mg in sodium chloride 0.9 % 100 mL IVPB  200 mg Intravenous Daily Champ Mungo, DO 440 mL/hr at 07/03/23 1114 200 mg at 07/03/23 1114   Na Sulfate-K Sulfate-Mg Sulfate concentrate (SUPREP) kit 177 mL  0.5 kit Oral Once Benancio Deeds, MD       Followed by   Melene Muller ON 07/04/2023] Na Sulfate-K Sulfate-Mg Sulfate concentrate (SUPREP) kit 177 mL  0.5 kit Oral Once Benancio Deeds, MD       pantoprazole (PROTONIX) EC tablet 40 mg  40 mg Oral Daily Marrianne Mood, MD   40 mg at 07/03/23 4132   revefenacin (YUPELRI) nebulizer solution 175 mcg  175 mcg Nebulization Daily Marrianne Mood, MD   175 mcg at 07/03/23 4401   rosuvastatin (CRESTOR) tablet 10 mg  10 mg Oral Daily Marrianne Mood, MD   10 mg at 07/03/23 0815   simethicone (MYLICON) chewable tablet 240 mg  240 mg Oral Once Benancio Deeds, MD       Followed by   Melene Muller ON 07/04/2023] simethicone (MYLICON) chewable tablet 240 mg  240 mg Oral Once Benancio Deeds, MD       venlafaxine XR (EFFEXOR-XR) 24 hr capsule 150 mg  150 mg Oral Daily Marrianne Mood, MD   150 mg at 07/03/23 0815    VITAL SIGNS: BP (!) 153/67 (BP Location: Left Arm)   Pulse 60   Temp 97.7 F (36.5 C) (Oral)   Resp (!) 21   Ht 5\' 1"  (1.549 m)   Wt 148 lb 11.2 oz (67.4 kg)   SpO2 100%   BMI 28.10 kg/m  Filed  Weights   07/02/23 0659 07/02/23 1146 07/03/23 0330  Weight: 151 lb 7.3 oz (68.7 kg) 148 lb 9.6 oz (67.4 kg) 148 lb 11.2 oz (67.4 kg)    Estimated body mass index is 28.1 kg/m as calculated from the following:   Height as of this encounter: 5\' 1"  (1.549 m).   Weight as of this encounter: 148 lb 11.2 oz (67.4 kg).  LABS: CBC:    Component Value Date/Time   WBC 11.6 (H) 07/03/2023 0458   HGB 8.0 (L) 07/03/2023 0272  HGB 10.9 (L) 12/09/2022 1211   HCT 26.1 (L) 07/03/2023 0458   HCT 34.3 12/09/2022 1211   PLT 369 07/03/2023 0458   PLT 246 12/09/2022 1211   MCV 78.9 (L) 07/03/2023 0458   MCV 97 12/09/2022 1211   NEUTROABS 8.2 (H) 07/02/2023 2013   LYMPHSABS 0.5 (L) 07/02/2023 2013   MONOABS 0.9 07/02/2023 2013   EOSABS 0.0 07/02/2023 2013   BASOSABS 0.0 07/02/2023 2013   Comprehensive Metabolic Panel:    Component Value Date/Time   NA 134 (L) 07/03/2023 0458   NA 137 12/09/2022 1211   K 3.3 (L) 07/03/2023 0458   CL 76 (L) 07/03/2023 0458   CO2 >45 (H) 07/03/2023 0458   BUN 31 (H) 07/03/2023 0458   BUN 37 (H) 12/09/2022 1211   CREATININE 1.88 (H) 07/03/2023 0458   CREATININE 1.43 (H) 08/26/2021 1048   CREATININE 0.86 12/18/2011 1627   GLUCOSE 125 (H) 07/03/2023 0458   CALCIUM 9.4 07/03/2023 0458   AST 38 07/02/2023 0712   AST 19 08/26/2021 1048   ALT 37 07/02/2023 0712   ALT 17 08/26/2021 1048   ALKPHOS 90 07/02/2023 0712   BILITOT 0.5 07/02/2023 0712   BILITOT <0.2 12/09/2022 1211   BILITOT 0.2 (L) 08/26/2021 1048   PROT 6.1 (L) 07/02/2023 0712   PROT 6.1 12/09/2022 1211   ALBUMIN 2.9 (L) 07/02/2023 0712   ALBUMIN 3.7 (L) 12/09/2022 1211    RADIOGRAPHIC STUDIES: DG Chest Portable 1 View Result Date: 07/02/2023 CLINICAL DATA:  73 year old female with shortness of breath. EXAM: PORTABLE CHEST 1 VIEW COMPARISON:  Chest radiographs 05/12/2023 and earlier. FINDINGS: Portable AP semi upright view at 0730 hours. Ongoing right pleural effusion, small to moderate. More  conspicuous fluid in the right minor fissure now. Underlying cardiomegaly. Stable cardiac size and mediastinal contours. Visualized tracheal air column is within normal limits. Stable pulmonary vascularity, mild interstitial edema is possible. No consolidation. But centrilobular emphysema confirmed on a 2023 chest CT. Cervical ACDF. Degenerative changes in the spine. Negative visible bowel gas pattern. IMPRESSION: Ongoing right pleural effusion, small to moderate. Chronic cardiomegaly. Emphysema (ICD10-J43.9). Mild interstitial edema is possible. Electronically Signed   By: Odessa Fleming M.D.   On: 07/02/2023 07:40    PERFORMANCE STATUS (ECOG) : 2 - Symptomatic, <50% confined to bed  Review of Systems Unless otherwise noted, a complete review of systems is negative.  Physical Exam General: chronically ill appearing Pulmonary: unlabored, on O2 Extremities: no edema, no joint deformities Skin: no rashes Neurological: Weakness but otherwise nonfocal  IMPRESSION: Patient with COPD/O2 dependence, afib on Eliquis, dCHF, CKD, and h/o SCC lung s/p lobectomy, who was recently hospitalized for COVID PNA and COPD exacerbation and is now admitted with symptomatic anemia. Initial hg 5.6 and improved to 8.0 after 1 unit PRBC. Patient has been seen by GI and is pending EGD/colonoscopy tomorrow.   Today, I met with patient and husband. They tell me that prior to her recent hospitalization, patient was doing well and not significantly limited by her COPD or other comorbidities. She was independent and active in the home regularly cooking and cleaning and performing other housework. Since her recent hospitalization, patient improved some but did not return to her previous functional baseline. She has been more limited and sedentary. Unclear how much of this is attributed to worsening anemia.   Patient and husband state that they are in agreement with the plan for workup and would like to treat any treatable problems.  Patient does not have  any advance directives but would want her husband to be her medical decision maker if needed. I would recommend that she establish ACP documents. Will consult the chaplain to assist with this.   Patient wishes to remain a full code/full scope of treatment. She would want an attempt made at resuscitation despite discussion of the likely futility associated with these interventions. Patient states that she would not want to remain on a ventilator long-term but would want to try "for a few days." She says "I am not ready to die."  PLAN: -Continue current scope of treatment -Patient in agreement with GI workup -She wants to remain a full code -Will ask chaplain to bring by ACP documents for her to review   Time Total: 60 minutes  Visit consisted of counseling and education dealing with the complex and emotionally intense issues of symptom management and palliative care in the setting of serious and potentially life-threatening illness.Greater than 50%  of this time was spent counseling and coordinating care related to the above assessment and plan.  Signed by: Laurette Schimke, PhD, NP-C

## 2023-07-04 ENCOUNTER — Inpatient Hospital Stay (HOSPITAL_COMMUNITY): Admitting: Certified Registered Nurse Anesthetist

## 2023-07-04 ENCOUNTER — Encounter (HOSPITAL_COMMUNITY)
Admission: EM | Disposition: A | Discharge status: Home Health | Payer: Self-pay | Source: Home / Self Care | Attending: Infectious Diseases

## 2023-07-04 ENCOUNTER — Encounter (HOSPITAL_COMMUNITY): Payer: Self-pay | Admitting: Infectious Diseases

## 2023-07-04 DIAGNOSIS — D509 Iron deficiency anemia, unspecified: Secondary | ICD-10-CM

## 2023-07-04 DIAGNOSIS — D124 Benign neoplasm of descending colon: Secondary | ICD-10-CM | POA: Diagnosis not present

## 2023-07-04 DIAGNOSIS — K5730 Diverticulosis of large intestine without perforation or abscess without bleeding: Secondary | ICD-10-CM | POA: Diagnosis not present

## 2023-07-04 DIAGNOSIS — D123 Benign neoplasm of transverse colon: Secondary | ICD-10-CM

## 2023-07-04 DIAGNOSIS — F17210 Nicotine dependence, cigarettes, uncomplicated: Secondary | ICD-10-CM

## 2023-07-04 DIAGNOSIS — I2510 Atherosclerotic heart disease of native coronary artery without angina pectoris: Secondary | ICD-10-CM

## 2023-07-04 DIAGNOSIS — K648 Other hemorrhoids: Secondary | ICD-10-CM

## 2023-07-04 DIAGNOSIS — D649 Anemia, unspecified: Secondary | ICD-10-CM | POA: Diagnosis not present

## 2023-07-04 DIAGNOSIS — D125 Benign neoplasm of sigmoid colon: Secondary | ICD-10-CM

## 2023-07-04 DIAGNOSIS — D122 Benign neoplasm of ascending colon: Secondary | ICD-10-CM

## 2023-07-04 DIAGNOSIS — E876 Hypokalemia: Secondary | ICD-10-CM | POA: Diagnosis not present

## 2023-07-04 DIAGNOSIS — E873 Alkalosis: Secondary | ICD-10-CM

## 2023-07-04 LAB — CBC
HCT: 23.3 % — ABNORMAL LOW (ref 36.0–46.0)
Hemoglobin: 7.1 g/dL — ABNORMAL LOW (ref 12.0–15.0)
MCH: 23.6 pg — ABNORMAL LOW (ref 26.0–34.0)
MCHC: 30.5 g/dL (ref 30.0–36.0)
MCV: 77.4 fL — ABNORMAL LOW (ref 80.0–100.0)
Platelets: 314 10*3/uL (ref 150–400)
RBC: 3.01 MIL/uL — ABNORMAL LOW (ref 3.87–5.11)
RDW: 17 % — ABNORMAL HIGH (ref 11.5–15.5)
WBC: 8.3 10*3/uL (ref 4.0–10.5)
nRBC: 0 % (ref 0.0–0.2)

## 2023-07-04 LAB — BASIC METABOLIC PANEL WITH GFR
Anion gap: 13 (ref 5–15)
Anion gap: 14 (ref 5–15)
BUN: 21 mg/dL (ref 8–23)
BUN: 17 mg/dL (ref 8–23)
CO2: 43 mmol/L — ABNORMAL HIGH (ref 22–32)
CO2: 40 mmol/L — ABNORMAL HIGH (ref 22–32)
Calcium: 9.2 mg/dL (ref 8.9–10.3)
Calcium: 9.1 mg/dL (ref 8.9–10.3)
Chloride: 78 mmol/L — ABNORMAL LOW (ref 98–111)
Chloride: 83 mmol/L — ABNORMAL LOW (ref 98–111)
Creatinine, Ser: 1.68 mg/dL — ABNORMAL HIGH (ref 0.44–1.00)
Creatinine, Ser: 1.53 mg/dL — ABNORMAL HIGH (ref 0.44–1.00)
GFR, Estimated: 32 mL/min — ABNORMAL LOW (ref >60)
GFR, Estimated: 36 mL/min — ABNORMAL LOW (ref >60)
Glucose, Bld: 97 mg/dL (ref 70–99)
Glucose, Bld: 107 mg/dL — ABNORMAL HIGH (ref 70–99)
Potassium: 2.7 mmol/L — CL (ref 3.5–5.1)
Potassium: 3.1 mmol/L — ABNORMAL LOW (ref 3.5–5.1)
Sodium: 134 mmol/L — ABNORMAL LOW (ref 135–145)
Sodium: 137 mmol/L (ref 135–145)

## 2023-07-04 LAB — BLOOD GAS, ARTERIAL
Acid-Base Excess: 28.1 mmol/L — ABNORMAL HIGH (ref 0.0–2.0)
Bicarbonate: 56.3 mmol/L — ABNORMAL HIGH (ref 20.0–28.0)
Drawn by: 53547
O2 Saturation: 98.9 %
Patient temperature: 37.1
pCO2 arterial: 69 mmHg (ref 32–48)
pH, Arterial: 7.52 — ABNORMAL HIGH (ref 7.35–7.45)
pO2, Arterial: 71 mmHg — ABNORMAL LOW (ref 83–108)

## 2023-07-04 LAB — POCT I-STAT, CHEM 8
BUN: 20 mg/dL (ref 8–23)
Calcium, Ion: 1.11 mmol/L — ABNORMAL LOW (ref 1.15–1.40)
Chloride: 79 mmol/L — ABNORMAL LOW (ref 98–111)
Creatinine, Ser: 1.7 mg/dL — ABNORMAL HIGH (ref 0.44–1.00)
Glucose, Bld: 100 mg/dL — ABNORMAL HIGH (ref 70–99)
HCT: 24 % — ABNORMAL LOW (ref 36.0–46.0)
Hemoglobin: 8.2 g/dL — ABNORMAL LOW (ref 12.0–15.0)
Potassium: 3.1 mmol/L — ABNORMAL LOW (ref 3.5–5.1)
Sodium: 135 mmol/L (ref 135–145)
TCO2: 43 mmol/L — ABNORMAL HIGH (ref 22–32)

## 2023-07-04 LAB — MAGNESIUM: Magnesium: 2.2 mg/dL (ref 1.7–2.4)

## 2023-07-04 LAB — TISSUE TRANSGLUTAMINASE, IGA: Tissue Transglutaminase Ab, IgA: <2 U/mL (ref 0–3)

## 2023-07-04 LAB — PREPARE RBC (CROSSMATCH)

## 2023-07-04 LAB — IGA: IgA: 307 mg/dL (ref 64–422)

## 2023-07-04 SURGERY — IMAGING PROCEDURE, GI TRACT, INTRALUMINAL, VIA CAPSULE
Anesthesia: LOCAL

## 2023-07-04 SURGERY — EGD (ESOPHAGOGASTRODUODENOSCOPY)
Anesthesia: Monitor Anesthesia Care

## 2023-07-04 MED ORDER — CAPSAICIN 0.025 % EX CREA
TOPICAL_CREAM | Freq: Two times a day (BID) | CUTANEOUS | Status: DC
Start: 2023-07-04 — End: 2023-07-06
  Filled 2023-07-04: qty 60

## 2023-07-04 MED ORDER — SODIUM CHLORIDE 0.9% IV SOLUTION
Freq: Once | INTRAVENOUS | Status: AC
Start: 2023-07-04 — End: 2023-07-05

## 2023-07-04 MED ORDER — POTASSIUM CHLORIDE 10 MEQ/100ML IV SOLN
10.0000 meq | INTRAVENOUS | Status: AC
  Administered 2023-07-04 (×6): 10 meq via INTRAVENOUS
  Filled 2023-07-04 (×5): qty 100

## 2023-07-04 MED ORDER — POTASSIUM CHLORIDE 10 MEQ/100ML IV SOLN: 10.0000 meq | INTRAVENOUS | Status: DC

## 2023-07-04 MED ORDER — SODIUM CHLORIDE 0.9 % IV SOLN
200.0000 mg | Freq: Every day | INTRAVENOUS | Status: AC
  Administered 2023-07-04 – 2023-07-05 (×2): 200 mg via INTRAVENOUS
  Filled 2023-07-04 (×2): qty 10

## 2023-07-04 MED ORDER — LACTATED RINGERS IV SOLN: INTRAVENOUS | Status: DC | PRN

## 2023-07-04 MED ORDER — POTASSIUM CHLORIDE 10 MEQ/100ML IV SOLN
10.0000 meq | INTRAVENOUS | Status: AC
  Administered 2023-07-04 (×3): 10 meq via INTRAVENOUS
  Filled 2023-07-04 (×3): qty 100

## 2023-07-04 MED ORDER — LIDOCAINE 2% (20 MG/ML) 5 ML SYRINGE
INTRAMUSCULAR | Status: DC | PRN
  Administered 2023-07-04: 40 mg via INTRAVENOUS

## 2023-07-04 MED ORDER — PROPOFOL 500 MG/50ML IV EMUL
INTRAVENOUS | Status: DC | PRN
  Administered 2023-07-04: 180 ug/kg/min via INTRAVENOUS

## 2023-07-04 MED ORDER — PROPOFOL 10 MG/ML IV BOLUS
INTRAVENOUS | Status: DC | PRN
  Administered 2023-07-04: 40 mg via INTRAVENOUS
  Administered 2023-07-04: 20 mg via INTRAVENOUS

## 2023-07-04 MED ORDER — POTASSIUM CHLORIDE CRYS ER 20 MEQ PO TBCR: 60.0000 meq | EXTENDED_RELEASE_TABLET | Freq: Once | ORAL | Status: DC

## 2023-07-04 MED ORDER — POTASSIUM CHLORIDE CRYS ER 20 MEQ PO TBCR
60.0000 meq | EXTENDED_RELEASE_TABLET | Freq: Once | ORAL | Status: AC
  Administered 2023-07-04: 60 meq via ORAL
  Filled 2023-07-04: qty 3

## 2023-07-04 MED ORDER — EPHEDRINE SULFATE (PRESSORS) 50 MG/ML IJ SOLN
INTRAMUSCULAR | Status: DC | PRN
  Administered 2023-07-04: 10 mg via INTRAVENOUS
  Administered 2023-07-04: 5 mg via INTRAVENOUS
  Administered 2023-07-04: 10 mg via INTRAVENOUS

## 2023-07-04 SURGICAL SUPPLY — 1 items: TOWEL COTTON PACK 4EA (MISCELLANEOUS) ×4 IMPLANT

## 2023-07-04 NOTE — Op Note (Signed)
 [facility] Patient Name: Maria Arroyo Procedure Date : 07/04/2023 MRN: [id number] Attending MD: Willaim Rayas. Adela Lank , MD, [id number] Date of Birth: 12-Jul-1950 CSN: [id number] Age: 73 Admit Type: Inpatient Procedure:                Upper GI endoscopy Indications:              Iron deficiency anemia - symptomatic anemia. On                            Eliquis Providers:                Marja Kays, Technician, Eliberto Ivory, RN,                            Willaim Rayas. Adela Lank, MD Referring MD:              Medicines:                Monitored Anesthesia Care Complications:            No immediate complications. Estimated blood loss:                            Minimal. Estimated Blood Loss:     Estimated blood loss was minimal. Procedure:                Pre-Anesthesia Assessment:                           - Prior to the procedure, a History and Physical                            was performed, and patient medications and                            allergies were reviewed. The patient's tolerance of                            previous anesthesia was also reviewed. The risks                            and benefits of the procedure and the sedation                            options and risks were discussed with the patient.                            All questions were answered, and informed consent                            was obtained. Prior Anticoagulants: The patient has                            taken Eliquis (apixaban), last dose was 3 days  prior to procedure. ASA Grade Assessment: III - A                            patient with severe systemic disease. After                            reviewing the risks and benefits, the patient was                            deemed in satisfactory condition to undergo the                            procedure.                           After obtaining informed consent, the endoscope was                             passed under direct vision. Throughout the                            procedure, the patient's blood pressure, pulse, and                            oxygen saturations were monitored continuously. The                            [id number]-[id number] ([id number]) Olympus endoscope was introduced                            through the mouth, and advanced to the second part                            of duodenum. The upper GI endoscopy was                            accomplished without difficulty. The patient                            tolerated the procedure well. Scope In: Scope Out: Findings:      Esophagogastric landmarks were identified: the Z-line was found at 39       cm, the gastroesophageal junction was found at 39 cm and the upper       extent of the gastric folds was found at 39 cm from the incisors.      The exam of the esophagus was otherwise normal.      The entire examined stomach was normal. Biopsies were taken with a cold       forceps for Helicobacter pylori testing.      The examined duodenum was normal. Impression:               - Esophagogastric landmarks identified.                           - Normal esophagus otherwise.                           -  Normal stomach. Biopsied to rule out H pylori.                           - Normal examined duodenum.                           No cause of IDA on EGD or colonoscopy. If patient                            is willing can do capsule endoscopy now (given she                            is prepped) to assess her small bowel given need to                            resume Eliquis. Recommendation:           - Return patient to hospital ward for ongoing care.                           - NPO and diet per capsule endoscopy protocol if                            she is willing to do capsule endoscopy                           - Continue present medications.                           - Await pathology results.                            - GI Service will reassess tomorrow Procedure Code(s):        --- Professional ---                           [phone], Esophagogastroduodenoscopy, flexible,                            transoral; with biopsy, single or multiple Diagnosis Code(s):        --- Professional ---                           D50.9, Iron deficiency anemia, unspecified CPT copyright 2022 American [employer] Association. All rights reserved. The codes documented in this report are preliminary and upon coder review may  be revised to meet current compliance requirements. Viviann Spare P. Emoni Whitworth, MD 07/04/2023 3:39:56 PM This report has been signed electronically. Number of Addenda: 0

## 2023-07-04 NOTE — Anesthesia Preprocedure Evaluation (Addendum)
 Anesthesia Evaluation  Patient identified by MRN, date of birth, ID band Patient awake    Reviewed: Allergy & Precautions, NPO status , Patient's Chart, lab work & pertinent test results  Airway Mallampati: II  TM Distance: >3 FB Neck ROM: Full    Dental  (+) Edentulous Upper, Edentulous Lower   Pulmonary COPD,  COPD inhaler and oxygen dependent, Current Smoker and Patient abstained from smoking. H/o lung cancer and VATS lobectomy R   breath sounds clear to auscultation + decreased breath sounds (on R)      Cardiovascular hypertension, Pt. on medications and Pt. on home beta blockers + CAD and + Cardiac Stents (RCA stenting)  + dysrhythmias Atrial Fibrillation + Valvular Problems/Murmurs MR  Rhythm:Irregular Rate:Normal  '20 ECHO: EF 55-60%.  1. The LV cavity size was mildly dilated. Left ventricular  diastolic Doppler parameters are consistent with impaired relaxation.   2. The right ventricle has moderately reduced systolic function. The  cavity was mildly enlarged. There is no increase in right ventricular wall  thickness.   3. Poor acoustic windows limit study of RV.   4. Right atrial size was moderately dilated.   5. The mitral valve is abnormal. Mild thickening of the mitral valve  leaflet. Mild calcification of the mitral valve leaflet. Mitral valve  regurgitation is mild to moderate by color flow Doppler.   6. MR is eccentric, directed posterior into LA Mild to moderate in  severity.   7. The aortic valve is abnormal. Mild thickening of the aortic valve.  Moderate calcification of the aortic valve.     Neuro/Psych    GI/Hepatic ,GERD  Medicated,,  Endo/Other    Renal/GU Renal InsufficiencyRenal diseaseK+ 3.1     Musculoskeletal   Abdominal   Peds  Hematology  (+) Blood dyscrasia (Hb 8.2, plt 314k), anemia eliquis   Anesthesia Other Findings   Reproductive/Obstetrics                               Anesthesia Physical Anesthesia Plan  ASA: 3  Anesthesia Plan: MAC   Post-op Pain Management: Minimal or no pain anticipated   Induction:   PONV Risk Score and Plan: 1 and Treatment may vary due to age or medical condition  Airway Management Planned: Natural Airway and Nasal Cannula  Additional Equipment: None  Intra-op Plan:   Post-operative Plan:   Informed Consent: I have reviewed the patients History and Physical, chart, labs and discussed the procedure including the risks, benefits and alternatives for the proposed anesthesia with the patient or authorized representative who has indicated his/her understanding and acceptance.       Plan Discussed with: CRNA and Surgeon  Anesthesia Plan Comments:         Anesthesia Quick Evaluation

## 2023-07-04 NOTE — Transfer of Care (Signed)
 Immediate Anesthesia Transfer of Care Note  Patient: Maria Arroyo  Procedure(s) Performed: EGD (ESOPHAGOGASTRODUODENOSCOPY) COLONOSCOPY BIOPSY, GASTRIC  Patient Location: PACU  Anesthesia Type:MAC  Level of Consciousness: awake and drowsy  Airway & Oxygen Therapy: Patient connected to nasal cannula oxygen  Post-op Assessment: Report given to RN and Post -op Vital signs reviewed and stable  Post vital signs: Reviewed and stable  Last Vitals:  Vitals Value Taken Time  BP 128/57 07/04/23 1538  Temp    Pulse 54 07/04/23 1541  Resp 12 07/04/23 1541  SpO2 97 % 07/04/23 1541  Vitals shown include unfiled device data.  Last Pain:  Vitals:   07/04/23 1538  TempSrc:   PainSc: 0-No pain      Patients Stated Pain Goal: 0 (07/03/23 2050)  Complications: No notable events documented.

## 2023-07-04 NOTE — Evaluation (Signed)
 Occupational Therapy Evaluation Patient Details Name: Maria Arroyo MRN: [id number] DOB: 1950/10/07 Today's Date: 07/04/2023   History of Present Illness   73 y/o female presented 07/02/23 with SOB for the past several days and found to have an acute COPD exacerbation on arrival.  Her respiratory panel is positive for COVID-19 though fortunately chest x-ray not showing any focal pneumonia. 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 Patient reporting that she is normally Indep in ADLs and IADLs but has her niece A as needed.  Patient requires mod A for supine to sit 2/2 b/l LE pain and min A and able to complete side steps to [facility] with min A  using RW.  She requires max A for donning socks and max a for peri hygiene.  Patient would benefit from additional OT intervention to address functional deficits.Patient would benefit from [facility] and family support for ADL as needed.     If plan is discharge home, recommend the following:   A little help with walking and/or transfers;Assist for transportation;Help with stairs or ramp for entrance;Assistance with cooking/housework;A little help with bathing/dressing/bathroom     Functional Status Assessment   Patient has had a recent decline in their functional status and demonstrates the ability to make significant improvements in function in a reasonable and predictable amount of time.     Equipment Recommendations   None recommended by OT     Recommendations for Other Services         Precautions/Restrictions   Precautions Precautions:  Fall Restrictions Weight Bearing Restrictions Per Provider Order: No     Mobility Bed Mobility Overal bed mobility: Needs Assistance Bed Mobility: Supine to Sit, Sit to Supine     Supine to sit: HOB elevated Sit to supine: Mod assist, HOB elevated        Transfers Overall transfer level: Needs assistance Equipment used: Rolling walker (2 wheels) Transfers: Sit to/from Stand Sit to Stand: Min assist                  Balance Overall balance assessment: Needs assistance Sitting-balance support: Feet supported, Bilateral upper extremity supported       Standing balance support: Bilateral upper extremity supported                               ADL either performed or assessed with clinical judgement   ADL Overall ADL's : Needs assistance/impaired Eating/Feeding: Set up   Grooming: Wash/dry face;Sitting;Supervision/safety   Upper Body Bathing: Minimal assistance;Sitting   Lower Body Bathing: Moderate assistance   Upper Body Dressing : Minimal assistance;Sitting   Lower Body Dressing: Maximal assistance (for donning socks, patient yelling out 2/2 pain in b/l LEs)   Toilet Transfer: Minimal assistance;BSC/3in1   Toileting- Clothing Manipulation and Hygiene: Moderate assistance       Functional mobility during ADLs: Minimal assistance;Rolling walker (2 wheels)       Vision Patient Visual Report: No change from baseline Vision Assessment?: No apparent visual deficits     Perception Perception: Within Functional Limits  Praxis         Pertinent Vitals/Pain Pain Assessment Pain Assessment: 0-10 Pain Score: 5  Pain Location:  (b/l LEs) Pain Descriptors / Indicators: Aching Pain Intervention(s): Monitored during session     Extremity/Trunk Assessment Upper Extremity Assessment Upper Extremity Assessment: Generalized weakness   Lower Extremity Assessment Lower Extremity Assessment: Defer to PT evaluation   Cervical / Trunk  Assessment Cervical / Trunk Assessment: Kyphotic   Communication Communication Communication: No apparent difficulties   Cognition Arousal: Alert Behavior During Therapy: Flat affect Cognition: No apparent impairments                               Following commands: Intact       Cueing  General Comments   Cueing Techniques: Verbal cues (for overall safety)      Exercises     Shoulder Instructions      Home Living Family/patient expects to be discharged to:: Private residence Living Arrangements: Spouse/significant other Available Help at Discharge: Family Type of Home: House Home Access: Stairs to enter Secretary/administrator of Steps: 5 Entrance Stairs-Rails: Can reach both Home Layout: One level     Bathroom Shower/Tub: Producer, television/film/video: Handicapped height Bathroom Accessibility: Yes How Accessible: Accessible via walker Home Equipment: Agricultural consultant (2 wheels);Cane - single point;Grab bars - tub/shower;Shower seat;BSC/3in1;Rollator (4 wheels)   Additional Comments: niece is available      Prior Functioning/Environment Prior Level of Function : Independent/Modified Independent             Mobility Comments: walks with RW at home, uses Rollator when going to appointments ADLs Comments: neice assists with iADLs and ADLs as needed    OT Problem List: Decreased strength;Decreased safety awareness;Decreased activity tolerance   OT Treatment/Interventions: Self-care/ADL training;Therapeutic exercise;DME and/or AE instruction;Therapeutic activities;Patient/family education      OT Goals(Current goals can be found in the care plan section)   Acute Rehab OT Goals OT Goal Formulation: With patient Time For Goal Achievement: 07/18/23 Potential to Achieve Goals: Good   OT Frequency:  Min 1X/week    Co-evaluation              AM-PAC OT "6 Clicks" Daily Activity     Outcome Measure Help from another person eating  meals?: A Little Help from another person taking care of personal grooming?: None Help from another person toileting, which includes using toliet, bedpan, or urinal?: A Lot   Help from another person to put on and taking off regular upper body clothing?: A Little Help from another person to put on and taking off regular lower body clothing?: A Lot 6 Click Score: 14   End of Session Equipment Utilized During Treatment: Rolling walker (2 wheels);Oxygen Nurse Communication: Mobility status  Activity Tolerance: Patient limited by pain Patient left: in bed;with call bell/phone within reach;with family/visitor present  OT Visit Diagnosis: Unsteadiness on feet (R26.81);Repeated falls (R29.6);History of falling (Z91.81);Muscle weakness (generalized) (M62.81)                Time: 1610-9604 OT Time Calculation (min): 13 min Charges:  OT General Charges $OT Visit: 1 Visit OT Evaluation $OT Eval Moderate Complexity: 1 Mod Governor Specking OT/L  Denice Paradise 07/04/2023, 1:20 PM

## 2023-07-04 NOTE — Progress Notes (Signed)
   07/04/23 7253  Provider Notification  Provider Name/Title Dr. Hassan Rowan (Dr. Gwenevere Abbot)  Date Provider Notified 07/04/23  Time Provider Notified [phone]  Method of Notification Page  Notification Reason Critical Result  Test performed and critical result Potassium 2.7  Date Critical Result Received 07/04/23  Time Critical Result Received 0508  Provider response See new orders  Date of Provider Response 07/04/23  Time of Provider Response 0526   Confirmed with Dr. Gwenevere Abbot on giving potassium pill since patient is on NPO for EGD/colonoscopy. MD ordered to proceed with potassium pill. ENDO RN made aware of her last intake.  Felicity Coyer, RN

## 2023-07-04 NOTE — Progress Notes (Signed)
 HD#2 SUBJECTIVE:  Patient Summary: Maria Arroyo is a 73 y.o. with a pertinent PMH of COPD, SCC of lung s/p lobectomy in 202, atrial fibrillation, chronic pain, depression, HTN, who presented with leg pain and swelling and admitted for iron deficiency anemia.   Overnight Events: NAEON  Interim History: Maria Arroyo feels okay this morning and has no acute concerns. Her legs continue to bother her and feel like she is having muscle spasms. All questions addressed.  OBJECTIVE:  Vital Signs: Vitals:   07/04/23 0513 07/04/23 0600 07/04/23 0758 07/04/23 0817  BP:    (!) 157/75  Pulse: (!) 54 (!) 48  (!) 57  Resp: 15 10  15   Temp:    98.8 F (37.1 C)  TempSrc:    Oral  SpO2: 100% 100% 100% 99%  Weight:  65.7 kg    Height:       Supplemental O2: Nasal Cannula SpO2: 99 % O2 Flow Rate (L/min): 4 L/min  Filed Weights   07/02/23 1146 07/03/23 0330 07/04/23 0600  Weight: 67.4 kg 67.4 kg 65.7 kg    Intake/Output Summary (Last 24 hours) at 07/04/2023 0950 Last data filed at 07/03/2023 2332 Gross per 24 hour  Intake 470 ml  Output 750 ml  Net -280 ml   Net IO Since Admission: -3,515 mL [07/04/23 0950]  Physical Exam: Constitutional:Chronically ill appearing female, resting comfortably in bed. In no acute distress. Cardio:Regular rate and rhythm.  Pulm:Diffuse mild inspiratory and expiratory wheezing. Breathing comfortably on 4L [place].  Abdomen:Soft, nontender, nondistended. UXL:[id number] for extremity edema. Skin:Warm and dry. Neuro:Alert and oriented x3. No focal deficit noted. Psych:Pleasant mood and affect.  Patient Lines/Drains/Airways Status     Active Line/Drains/Airways     Name Placement date Placement time Site Days   Peripheral IV 07/02/23 20 G Anterior;Right Forearm 07/02/23  1200  Forearm  2   Pressure Injury 07/02/23 Buttocks Right;Left Stage 1 -  Intact skin with non-blanchable redness of a localized area usually over a bony prominence. Redness with  small intact blisters 07/02/23  2200  -- 2   Wound / Incision (Open or Dehisced) 07/02/23 Other (Comment) Thigh Right Right Inner thigh 07/02/23  2200  Thigh  2             ASSESSMENT/PLAN:  Assessment: Principal Problem:   Acute on chronic anemia Active Problems:   Arthritis   Chronic pain   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)   Atrial fibrillation (HCC)   Right ventricular failure (HCC)   COPD (chronic obstructive pulmonary disease) (HCC)   Hypokalemia   Malignant neoplasm of lung (HCC)   CKD (chronic kidney disease) stage 4, GFR 15-29 ml/min (HCC)   Chronic hypoxic respiratory failure (HCC)   Iron deficiency anemia   Anticoagulated   Tobacco use disorder   Hereditary and idiopathic peripheral neuropathy   Palliative care encounter   Pressure injury of skin  Maria Arroyo is a 72 y.o. with a pertinent PMH of COPD, SCC of lung s/p lobectomy in 202, atrial fibrillation, chronic pain, depression, HTN, who presented with leg pain and swelling and admitted for iron deficiency anemia.   Plan: Metabolic alkalosis with respiratory compensation Several electrolyte derangements (hyponatremia, hypokalemia, hypochloridemia) Large increase in bicarbonate level on AM labs were followed up with ABG that showed pH 7.52, pCO2 69, pO2 71, bicarbonate 56.3. Of note metabolic panel 1 month ago notes normal bicarbonate level. She denies nausea or vomiting, no diarrhea, does not  eat licorice. She has not received significant quantity of blood products. She is not on laxative medications, no antacids. She likely experiences some degree of chronic hypercapnia in setting of COPD. She does have hypertension despite antihypertensive therapy. These findings are complicated further by hyponatremia, hypokalemia, and hypochloremia. Fortunately she is compensating appropriately. Plan: -Will check plasma renin/aldosterone -Check random urine Na, K, Cl -Repeat BMP this  afternoon, continue to replete potassium as indicated  Acute on chronic anemia Decreased again to 7.1, no overt signs of bleed. Source remains unclear, and EGD/colonoscopy are planned with GI for tomorrow 04/07. IV iron repletion started 04/05, scheduled for 3 total doses.  Plan: -Will transfuse additional unit of PRBC now; follow up post-transfusion H&H -Continue IV iron, today is dose 2/3 -GI following, appreciate their assistance  -EGD, colonoscopy planned for tomorrow -Continue protonix 40 mg daily -Continue to hold home eliquis -F/u tissue transglutaminase antibodies, IgA level -Trend CBC, transfuse for Hgb <7   Acute on chronic hypoxic respiratory failure Hx COPD Hx HFpEF Hx SCC of lung s/p lobectomy   Intermittently requires supplemental O2, which she is not on at home. Clinically euvolemic, not concerned for acute CHF exacerbation. Does have wheezing on exam but not concerned for acute COPD exacerbation. Plan: -Strict I's & O's, daily weights -Continue brovana BID, yupelri daily, duonebs q4h PRN wheezing, shortness of breath  Paroxysmal Atrial fibrillation Stable. Plan: -Continue to hold home eliquis -Continue amiodarone 200 mg daily   Peripheral neuropathy Chronic pain On further questioning she states the distal LE pain feels like her muscles are spasming. I suspect some of her discomfort is due to her metabolic derangements discussed above on top of baseline diabetic neuropathy.  Plan: -Replete electrolytes as indicated -Will start capsaicin cream applied over bilateral calves BID, aquathermia PRN muscle spasms -Continue gabapentin 300 mg daily   CKD stage 4 Plan: -Trend renal function -Avoid nephrotoxic agents   HTN Plan: -Continue amiodarone 200 mg daily, bisoprolol 2.5 mg daily  Hx depression Plan: -Continue home buspirone 15 mg BID, venlafaxine 150 mg daily  HLD Plan: -Continue home rosuvastatin 10 mg daily  Hx gout Plan: -Continue home  allopurinol 100 mg daily  Best Practice: Diet: Clear liquid diet IVF: None VTE: SCDs Start: 07/02/23 1022 Code: Full AB: None  Signature: Champ Mungo, D.O.  Internal Medicine Resident, PGY-3 [facility]  Pager: [phone]   Please contact the on call pager after 5 pm and on weekends at [phone].

## 2023-07-04 NOTE — Interval H&P Note (Signed)
 History and Physical Interval Note: Patient here for EGD and colonoscopy. Symptomatic IDA, on Eliquis, has been held. She reports her breathing is at baseline today and feels well. K repleted. Nursing reports she is passing clear liquids with her prep. I discussed risks / benefits of the exam and anesthesia and she wishes to proceed. Further recommendations pending the results.   07/04/2023 1:37 PM  Maria Arroyo Maria Arroyo  has presented today for surgery, with the diagnosis of iron deficiency anemia.  The various methods of treatment have been discussed with the patient and family. After consideration of risks, benefits and other options for treatment, the patient has consented to  Procedure(s): EGD (ESOPHAGOGASTRODUODENOSCOPY) (N/A) COLONOSCOPY (N/A) as a surgical intervention.  The patient's history has been reviewed, patient examined, no change in status, stable for surgery.  I have reviewed the patient's chart and labs.  Questions were answered to the patient's satisfaction.     Viviann Spare P Lean Jaeger

## 2023-07-04 NOTE — Progress Notes (Signed)
Patient off floor to Endo.   

## 2023-07-04 NOTE — Op Note (Signed)
 [facility] Patient Name: Maria Arroyo Procedure Date : 07/04/2023 MRN: [id number] Attending MD: Willaim Rayas. Adela Lank , MD, [id number] Date of Birth: 1950/10/10 CSN: [id number] Age: 73 Admit Type: Inpatient Procedure:                Colonoscopy Indications:              Iron deficiency anemia on Eliquis. Negative EGD. Providers:                Marja Kays, Technician, Eliberto Ivory, RN,                            Willaim Rayas. Adela Lank, MD Referring MD:              Medicines:                Monitored Anesthesia Care Complications:            No immediate complications. Estimated blood loss:                            Minimal. Estimated Blood Loss:     Estimated blood loss was minimal. Procedure:                Pre-Anesthesia Assessment:                           - Prior to the procedure, a History and Physical                            was performed, and patient medications and                            allergies were reviewed. The patient's tolerance of                            previous anesthesia was also reviewed. The risks                            and benefits of the procedure and the sedation                            options and risks were discussed with the patient.                            All questions were answered, and informed consent                            was obtained. Prior Anticoagulants: The patient has                            taken Eliquis (apixaban), last dose was 3 days                            prior to procedure. ASA Grade Assessment: III - A  patient with severe systemic disease. After                            reviewing the risks and benefits, the patient was                            deemed in satisfactory condition to undergo the                            procedure.                           After obtaining informed consent, the colonoscope                            was passed under direct  vision. Throughout the                            procedure, the patient's blood pressure, pulse, and                            oxygen saturations were monitored continuously. The                            [id number]-HQ190TL ([id number] Olympus peds colonoscope was                            introduced through the anus and advanced to the the                            terminal ileum, with identification of the                            appendiceal orifice and IC valve. The colonoscopy                            was performed without difficulty. The patient                            tolerated the procedure well. The quality of the                            bowel preparation was adequate. The terminal ileum,                            ileocecal valve, appendiceal orifice, and rectum                            were photographed. Scope In: 3:04:41 PM Scope Out: 3:25:20 PM Scope Withdrawal Time: 0 hours 13 minutes 45 seconds  Total Procedure Duration: 0 hours 20 minutes 39 seconds  Findings:      The perianal and digital rectal examinations were normal.      The terminal ileum appeared normal.      A 15 to 20 mm polyp  was found in the ascending colon. The polyp was       flat. It was not removed today given ongoing workup of IDA, not to       confound picture if she had bleeding. In this light, no polyps were       removed.      Three sessile polyps were found in the transverse colon. The polyps were       4 to 6 mm in size.      A 3 mm polyp was found in the splenic flexure. The polyp was sessile.      A 5 mm polyp was found in the descending colon. The polyp was sessile.      Three sessile polyps were found in the sigmoid colon. The polyps were 4       to 6 mm in size.      Multiple small-mouthed diverticula were found in the sigmoid colon.      Internal hemorrhoids were found during retroflexion.      The exam was otherwise without abnormality. Impression:               - The examined  portion of the ileum was normal.                           - One 15 to 20 mm polyp in the ascending colon.                           - Three 4 to 6 mm polyps in the transverse colon.                           - One 3 mm polyp at the splenic flexure.                           - One 5 mm polyp in the descending colon.                           - Three 4 to 6 mm polyps in the sigmoid colon.                           - Diverticulosis in the sigmoid colon.                           - Internal hemorrhoids.                           - The examination was otherwise normal.                           - No specimens collected.                           NO cause for IDA or anemia on colonoscopy or EGD.                            Will discuss with patient if she is willing to have  capsule endoscopy to clear her small bowel, given                            need to go back on anticoagulation. Recommendation:           - Return patient to hospital ward for ongoing care.                           - NPO if patient is willing to have capsule                            endoscopy placed                           - Continue present medications.                           - Trend Hgb                           - Repeat colonoscopy as outpatient when she has                            completed her workup for IDA and more stable, for                            removal of colon polyps with primary GI MD                           - GI inpatient service will reassess her tomorrow Procedure Code(s):        --- Professional ---                           [phone], Colonoscopy, flexible; diagnostic, including                            collection of specimen(s) by brushing or washing,                            when performed (separate procedure) Diagnosis Code(s):        --- Professional ---                           K64.8, Other hemorrhoids                           D12.2, Benign neoplasm of  ascending colon                           D12.3, Benign neoplasm of transverse colon (hepatic                            flexure or splenic flexure)                           D12.4, Benign neoplasm of descending  colon                           D12.5, Benign neoplasm of sigmoid colon                           D50.9, Iron deficiency anemia, unspecified                           K57.30, Diverticulosis of large intestine without                            perforation or abscess without bleeding CPT copyright 2022 American Medical Association. All rights reserved. The codes documented in this report are preliminary and upon coder review may  be revised to meet current compliance requirements. Viviann Spare P. Layni Kreamer, MD 07/04/2023 3:37:14 PM This report has been signed electronically. Number of Addenda: 0

## 2023-07-04 NOTE — Anesthesia Postprocedure Evaluation (Signed)
 Anesthesia Post Note  Patient: Maria Arroyo  Procedure(s) Performed: EGD (ESOPHAGOGASTRODUODENOSCOPY) COLONOSCOPY BIOPSY, GASTRIC     Patient location during evaluation: PACU Anesthesia Type: MAC Level of consciousness: awake and alert, patient cooperative and oriented Pain management: pain level controlled Vital Signs Assessment: post-procedure vital signs reviewed and stable Respiratory status: spontaneous breathing, nonlabored ventilation, respiratory function stable and patient connected to nasal cannula oxygen Cardiovascular status: stable and blood pressure returned to baseline Postop Assessment: no apparent nausea or vomiting Anesthetic complications: no   No notable events documented.  Last Vitals:  Vitals:   07/04/23 1615 07/04/23 1708  BP: (!) 155/64 (!) 163/59  Pulse: (!) 55 74  Resp: 15 14  Temp:  37.1 C  SpO2: 98% 97%    Last Pain:  Vitals:   07/04/23 1708  TempSrc: Oral  PainSc:                  Kadynce Bonds,E. Marene Gilliam

## 2023-07-04 NOTE — Progress Notes (Signed)
 Patient back to room from endoscopy.

## 2023-07-04 NOTE — Plan of Care (Signed)
  Problem: Clinical Measurements: Goal: Respiratory complications will improve Outcome: Progressing Goal: Cardiovascular complication will be avoided Outcome: Progressing   Problem: Activity: Goal: Risk for activity intolerance will decrease Outcome: Progressing   Problem: Elimination: Goal: Will not experience complications related to urinary retention Outcome: Progressing   Problem: Safety: Goal: Ability to remain free from injury will improve Outcome: Progressing   Problem: Skin Integrity: Goal: Risk for impaired skin integrity will decrease Outcome: Progressing

## 2023-07-04 NOTE — OR Nursing (Signed)
 Contacted primary RN Epic Chat for IV run of potassium. Maria Arroyo

## 2023-07-05 ENCOUNTER — Encounter (HOSPITAL_COMMUNITY): Payer: Self-pay | Admitting: Gastroenterology

## 2023-07-05 DIAGNOSIS — M25551 Pain in right hip: Secondary | ICD-10-CM

## 2023-07-05 DIAGNOSIS — I129 Hypertensive chronic kidney disease with stage 1 through stage 4 chronic kidney disease, or unspecified chronic kidney disease: Secondary | ICD-10-CM

## 2023-07-05 DIAGNOSIS — N184 Chronic kidney disease, stage 4 (severe): Secondary | ICD-10-CM

## 2023-07-05 LAB — BASIC METABOLIC PANEL WITH GFR
Anion gap: 11 (ref 5–15)
BUN: 14 mg/dL (ref 8–23)
CO2: 39 mmol/L — ABNORMAL HIGH (ref 22–32)
Calcium: 9.1 mg/dL (ref 8.9–10.3)
Chloride: 87 mmol/L — ABNORMAL LOW (ref 98–111)
Creatinine, Ser: 1.66 mg/dL — ABNORMAL HIGH (ref 0.44–1.00)
GFR, Estimated: 33 mL/min — ABNORMAL LOW (ref >60)
Glucose, Bld: 92 mg/dL (ref 70–99)
Potassium: 3.3 mmol/L — ABNORMAL LOW (ref 3.5–5.1)
Sodium: 137 mmol/L (ref 135–145)

## 2023-07-05 LAB — CBC
HCT: 23.4 % — ABNORMAL LOW (ref 36.0–46.0)
Hemoglobin: 6.8 g/dL — CL (ref 12.0–15.0)
MCH: 23.6 pg — ABNORMAL LOW (ref 26.0–34.0)
MCHC: 29.1 g/dL — ABNORMAL LOW (ref 30.0–36.0)
MCV: 81.3 fL (ref 80.0–100.0)
Platelets: 328 10*3/uL (ref 150–400)
RBC: 2.88 MIL/uL — ABNORMAL LOW (ref 3.87–5.11)
RDW: 17.4 % — ABNORMAL HIGH (ref 11.5–15.5)
WBC: 10 10*3/uL (ref 4.0–10.5)
nRBC: 0 % (ref 0.0–0.2)

## 2023-07-05 LAB — MAGNESIUM: Magnesium: 2.1 mg/dL (ref 1.7–2.4)

## 2023-07-05 LAB — HEMOGLOBIN AND HEMATOCRIT, BLOOD
HCT: 28.4 % — ABNORMAL LOW (ref 36.0–46.0)
Hemoglobin: 8.4 g/dL — ABNORMAL LOW (ref 12.0–15.0)

## 2023-07-05 LAB — CHLORIDE, URINE, RANDOM: Chloride Urine: <15 mmol/L

## 2023-07-05 LAB — NA AND K (SODIUM & POTASSIUM), RAND UR
Potassium Urine: 38 mmol/L
Sodium, Ur: 71 mmol/L

## 2023-07-05 MED ORDER — POTASSIUM CHLORIDE CRYS ER 20 MEQ PO TBCR
40.0000 meq | EXTENDED_RELEASE_TABLET | Freq: Once | ORAL | Status: AC
  Administered 2023-07-05: 40 meq via ORAL
  Filled 2023-07-05: qty 2

## 2023-07-05 MED ORDER — SODIUM CHLORIDE 0.9% IV SOLUTION: Freq: Once | INTRAVENOUS | Status: AC

## 2023-07-05 MED ORDER — ENSURE ENLIVE PO LIQD
237.0000 mL | Freq: Two times a day (BID) | ORAL | Status: DC
  Administered 2023-07-05 – 2023-07-06 (×3): 237 mL via ORAL

## 2023-07-05 NOTE — Progress Notes (Signed)
 Physical Therapy Treatment Patient Details Name: Maria Arroyo MRN: [id number] DOB: [birth year] Today's Date: 07/05/2023   History of Present Illness 73 y/o female presented 07/02/23 with SOB for the past several days and found to have an acute COPD exacerbation on arrival.  Her respiratory panel is positive for COVID-19 though fortunately chest x-ray not showing any focal pneumonia. 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  Reports the one she got from here has already needing service as wheel coming off.  Encouraged to reach out to company.  PT  will continue to follow in the acute setting.  Hopeful for home with family support and HHPT at d/c.    If plan is discharge home, recommend the following: A little help with walking and/or transfers;A little help with bathing/dressing/bathroom;Assistance with cooking/housework;Direct supervision/assist for medications management;Direct supervision/assist for financial management;Assist for transportation;Help with stairs or ramp for entrance;Supervision due to cognitive status   Can travel by private vehicle        Equipment Recommendations  None recommended by PT    Recommendations for Other Services       Precautions / Restrictions Precautions Precautions: Fall Precaution/Restrictions Comments: O2     Mobility  Bed Mobility Overal bed mobility: Needs Assistance Bed Mobility: Supine to Sit, Sit to Supine     Supine to sit: HOB elevated, Supervision Sit to supine: Supervision   General bed mobility comments: cues for positioning once supine, patient able to mobilize to EOB on her  own    Transfers Overall transfer level: Needs assistance Equipment used: Rollator (4 wheels) Transfers: Sit to/from Stand Sit to Stand: Supervision           General transfer comment: assisted for safety and lines    Ambulation/Gait Ambulation/Gait assistance: Contact guard assist, Supervision Gait Distance (Feet): 15 Feet (&30') Assistive device: Rollator (4 wheels) Gait Pattern/deviations: Step-to pattern, Step-through pattern, Decreased stride length       General Gait Details: around bed to sit in chair in front of sink, then walked into hallway then turned to return to bedside, slow pace and maintained on 2L O2 throughout   Stairs             Wheelchair Mobility     Tilt Bed    Modified Rankin (Stroke Patients Only)       Balance Overall balance assessment: Needs assistance Sitting-balance support: Feet supported Sitting balance-Leahy Scale: Good     Standing balance support: Bilateral upper extremity supported, Reliant on assistive device for balance Standing balance-Leahy Scale: Poor                              Communication Communication Communication: No apparent difficulties  Cognition Arousal: Alert Behavior During Therapy: WFL for tasks assessed/performed   PT - Cognitive impairments: No apparent impairments                         Following commands: Intact      Cueing Cueing Techniques: Verbal cues  Exercises      General Comments General comments (skin integrity, edema, etc.): BP not checked with  mobility though pt denies dizziness, SpO2 90% or greater except walking holding walker but quickly up to 90% or greater soon as hand off walke.  RN present and assisted to change bed linens, reports had blood and now hemoglobin above 8      Pertinent Vitals/Pain Pain Assessment Pain Assessment: Faces Faces Pain Scale: Hurts little more Pain Location: legs with ambulation Pain Descriptors / Indicators:  Aching Pain Intervention(s): Monitored during session    Home Living                          Prior Function            PT Goals (current goals can now be found in the care plan section) Progress towards PT goals: Progressing toward goals    Frequency    Min 2X/week      PT Plan      Co-evaluation              AM-PAC PT "6 Clicks" Mobility   Outcome Measure  Help needed turning from your back to your side while in a flat bed without using bedrails?: A Little Help needed moving from lying on your back to sitting on the side of a flat bed without using bedrails?: A Little Help needed moving to and from a bed to a chair (including a wheelchair)?: A Little Help needed standing up from a chair using your arms (e.g., wheelchair or bedside chair)?: A Little Help needed to walk in hospital room?: A Little Help needed climbing 3-5 steps with a railing? : Total 6 Click Score: 16    End of Session Equipment Utilized During Treatment: Oxygen Activity Tolerance: Patient tolerated treatment well Patient left: in bed;with call bell/phone within reach   PT Visit Diagnosis: Muscle weakness (generalized) (M62.81);Difficulty in walking, not elsewhere classified (R26.2);Other symptoms and signs involving the nervous system (R29.898)     Time: 1610-9604 PT Time Calculation (min) (ACUTE ONLY): 28 min  Charges:    $Gait Training: 8-22 mins $Therapeutic Activity: 8-22 mins PT General Charges $$ ACUTE PT VISIT: 1 Visit                     Sheran Lawless, PT Acute Rehabilitation Services Office:[phone] 07/05/2023    Maria Arroyo 07/05/2023, 5:45 PM

## 2023-07-05 NOTE — Progress Notes (Signed)
 This chaplain responded to PMT NP-Joshua consult for creating/updating the Pt. Advance Directive. The Pt. daughter is at the bedside during the visit.   The Pt. accepted AD education from the chaplain and a blank AD for sharing with the Pt. husband. The Pt. indicates her choice of HCPOA is her husband-Billy. The chaplain provided education on [employer] Hierarchy of Decision Makers and noted the Pt. husband as the Pt. surrogate decision maker.   The chaplain understands the Pt. plans to talk to her husband before completing an AD. This chaplain will revisit as needed.  This chaplain is available for F/U spiritual care as needed.  Chaplain Stephanie Acre [phone]

## 2023-07-05 NOTE — Plan of Care (Signed)
 Patient got one unit of blood today and Hg improved.  Ambulated in hall today.  Vitals remained stable.

## 2023-07-05 NOTE — Plan of Care (Signed)

## 2023-07-05 NOTE — Progress Notes (Signed)
 Subjective:   Summary: 73 yo F with PMH of COPD on 3L home O2, SCC of lung s/p lobectomy, paroxysmal Afib on Eliquis, chronic pain, depression, admitted for acute on chronic IDA  Patient feeling well today, continues to have chronic R hip pain and tightness. Endorses some weakness when she has been out of bed to go to the bathroom.   Objective:  Vital signs in last 24 hours: Vitals:   07/05/23 0817 07/05/23 0828 07/05/23 1019 07/05/23 1233  BP:  (!) 158/77 (!) 167/75 (!) 161/65  Pulse:  (!) 59 (!) 54 (!) 56  Resp:  15 12 15   Temp:  98.6 F (37 C) 98.8 F (37.1 C) 98.5 F (36.9 C)  TempSrc:  Oral Axillary Oral  SpO2: 100% 98% 99% 94%  Weight:      Height:       Supplemental O2: Nasal Cannula SpO2: 94 % O2 Flow Rate (L/min): 3 L/min  Physical Exam:  Constitutional: chronically ill appearing, no acute distress Cardiovascular: RRR, no murmurs Pulmonary/Chest: lungs CTAB, distant lung sounds. No respiratory distress on 3L [place] Abdominal: soft, nontender, nondistended Neuro: A&O x 3  Assessment/Plan:   Acute on chronic anemia Hgb 6.8 today. No overt signs of bleeding. Source of anemia remains unclear. EGD/colonoscopy performed 4/6 shows no evidence of overt bleeding. Capsule endoscopy performed by GI, awaiting results - 1 unit pRBCs transfused this am for total 2 units during this admission - IV iron dose 3/3 today - Results of capsule endoscopy pending. Appreciate GI assistance - Continue protonix 40mg  daily - Holding home Eliquis  Metabolic alkalosis with respiratory compensation ABG from 4/6 shows pH 7.52, pCO2 69. Metabolic panels performed prior to this admission show normal bicarbonate level. She likely experiences some degree of chronic hypercapnia due to COPD. Urine electrolytes wnl. Still unsure etiology of alkalosis and hypercapnia. May consider nephrology consult if workup does not show correctable causes of alkalosis.  - Plasma renin/aldosterone  pending - Replete electrolytes as needed - Consider nephrology consult  Acute on chronic R hip pain Patient describes worsening leg pain primarily of her right hip, as well as posterior buttocks and thighs. She received a steroid injection some time ago which relieved her hip pain for a while. She also reports having a spinal surgery in the past, possibly for a herniated disc. Overall this does not appear to be a new problem for her, though currently worse than it was previously.  - Continue home gabapentin 300mg  BID - Capsaicin cream, aquathermia prn  Chronic hypoxic respiratory failure COPD HFpEF Patient confirms she uses 3L at home, which she has been requiring during admission. Low concern for exacerbation of pulmonary or cardiac disease - Continue Brovana BID, Yupelri, duonebs q4h prn  Paroxysmal atrial fibrillation Telemetry shows continued sinus bradycardia.  - Continue home amiodarone 200mg , hold bisoprolol 2.5mg .  - Hold home Eliquis  HTN Blood pressure has been high/normal. Continue home amiodarone 200mg  daily, discontinuing bisoprolol 2.5mg  daily due to bradycardia. Will add back home antihypertensives if BP remains elevated  Chronic stable conditions CKD4: trend renal function Depression: continue home buspirone 15mg  BID, venlafaxine 150mg  daily HLD: continue home rosuvastatin 10mg   Gout: continue home allopurinol 100mg  daily  Diet: clear liquid diet VTE: SCD Code: Full  Dispo: Anticipated discharge to Home pending medical stability.   Monna Fam, MD PGY-1 Internal Medicine Resident Pager Number [phone] Please contact the on call  pager after 5 pm and on weekends at [phone].

## 2023-07-06 ENCOUNTER — Other Ambulatory Visit (HOSPITAL_COMMUNITY): Payer: Self-pay

## 2023-07-06 DIAGNOSIS — Z7901 Long term (current) use of anticoagulants: Secondary | ICD-10-CM

## 2023-07-06 DIAGNOSIS — D509 Iron deficiency anemia, unspecified: Secondary | ICD-10-CM

## 2023-07-06 LAB — BASIC METABOLIC PANEL WITH GFR
Anion gap: 10 (ref 5–15)
BUN: 19 mg/dL (ref 8–23)
CO2: 37 mmol/L — ABNORMAL HIGH (ref 22–32)
Calcium: 8.9 mg/dL (ref 8.9–10.3)
Chloride: 85 mmol/L — ABNORMAL LOW (ref 98–111)
Creatinine, Ser: 1.58 mg/dL — ABNORMAL HIGH (ref 0.44–1.00)
GFR, Estimated: 35 mL/min — ABNORMAL LOW (ref >60)
Glucose, Bld: 158 mg/dL — ABNORMAL HIGH (ref 70–99)
Potassium: 4.3 mmol/L (ref 3.5–5.1)
Sodium: 132 mmol/L — ABNORMAL LOW (ref 135–145)

## 2023-07-06 LAB — PREPARE RBC (CROSSMATCH)

## 2023-07-06 LAB — CBC
HCT: 28.5 % — ABNORMAL LOW (ref 36.0–46.0)
Hemoglobin: 8.4 g/dL — ABNORMAL LOW (ref 12.0–15.0)
MCH: 23.7 pg — ABNORMAL LOW (ref 26.0–34.0)
MCHC: 29.5 g/dL — ABNORMAL LOW (ref 30.0–36.0)
MCV: 80.5 fL (ref 80.0–100.0)
Platelets: 319 10*3/uL (ref 150–400)
RBC: 3.54 MIL/uL — ABNORMAL LOW (ref 3.87–5.11)
RDW: 17.2 % — ABNORMAL HIGH (ref 11.5–15.5)
WBC: 11.4 10*3/uL — ABNORMAL HIGH (ref 4.0–10.5)
nRBC: 0.4 % — ABNORMAL HIGH (ref 0.0–0.2)

## 2023-07-06 LAB — BPAM RBC
Blood Product Expiration Date: 202504302359
Blood Product Expiration Date: 202504302359
Blood Product Expiration Date: 202504272359
ISSUE DATE / TIME: 202504070759
ISSUE DATE / TIME: 202504080504
ISSUE DATE / TIME: 202504041236
Unit Type and Rh: 202504302359
Unit Type and Rh: 202504302359
Unit Type and Rh: 5100
Unit Type and Rh: 5100
Unit Type and Rh: 5100

## 2023-07-06 LAB — SURGICAL PATHOLOGY

## 2023-07-06 LAB — TYPE AND SCREEN
ABO/RH(D): O POS
Antibody Screen: NEGATIVE
Unit division: 0
Unit division: 0
Unit division: 0

## 2023-07-06 MED ORDER — HYDRALAZINE HCL 25 MG PO TABS
25.0000 mg | ORAL_TABLET | Freq: Three times a day (TID) | ORAL | Status: DC
  Administered 2023-07-06: 25 mg via ORAL
  Filled 2023-07-06 (×2): qty 1

## 2023-07-06 MED ORDER — IRON (FERROUS SULFATE) 325 (65 FE) MG PO TABS
325.0000 mg | ORAL_TABLET | Freq: Every day | ORAL | 3 refills | Status: DC
  Filled 2023-07-06: qty 30, 30d supply, fill #0

## 2023-07-06 NOTE — Progress Notes (Signed)
 Patient ID: Maria Arroyo, female   DOB: June 16, 1950, 73 y.o.   MRN: [id number]    Progress Note   Subjective   Day # 4 CC;symptomatic iron deficiency anemia, heme-negative on admission  Eliquis on hold Has received doses of IV iron here  Capsule Endoscopy ;  Complete study- Negative study with no findings to explain Iron deficiency anemia.   Colon polyp path - pending Gastric bx -pending  WBC 11.4/hemoglobin 8.4/hematocrit 28.5 stable)  Patient resting in bed today, no specific complaints     Objective   Vital signs in last 24 hours: Temp:  [98.2 F (36.8 C)-99.2 F (37.3 C)] 98.2 F (36.8 C) (04/08 0815) Pulse Rate:  [54-60] 60 (04/08 0815) Resp:  [12-18] 17 (04/08 0815) BP: (140-167)/(61-87) 148/87 (04/08 0815) SpO2:  [94 %-99 %] 97 % (04/08 0815) Weight:  [72.3 kg] 72.3 kg (04/08 0407) Last BM Date : 07/04/23 General: Older   white female in NAD Heart:  Regular rate and rhythm; no murmurs Lungs: Respirations even and unlabored, lungs CTA bilaterally Abdomen:  Soft, nontender and nondistended. Normal bowel sounds. Extremities:  Without edema. Neurologic:  Alert and oriented,  grossly normal neurologically. Psych:  Cooperative. Normal mood and affect.  Intake/Output from previous day: 04/07 0701 - 04/08 0700 In: 657.3 [P.O.:120; I.V.:16.3; Blood:408.7; IV Piggyback:112.3] Out: 1100 [Urine:1100] Intake/Output this shift: No intake/output data recorded.  Lab Results: Recent Labs    07/04/23 0412 07/04/23 1334 07/05/23 0520 07/05/23 1428 07/06/23 0456  WBC 8.3  --  10.0  --  11.4*  HGB 7.1*   < > 6.8* 8.4* 8.4*  HCT 23.3*   < > 23.4* 28.4* 28.5*  PLT 314  --  328  --  319   < > = values in this interval not displayed.   BMET Recent Labs    07/04/23 1658 07/05/23 0520 07/06/23 0456  NA 137 137 132*  K 3.1* 3.3* 4.3  CL 83* 87* 85*  CO2 40* 39* 37*  GLUCOSE 107* 92 158*  BUN 17 14 19   CREATININE 1.53* 1.66* 1.58*  CALCIUM 9.1 9.1 8.9    LFT No results for input(s): "PROT", "ALBUMIN", "AST", "ALT", "ALKPHOS", "BILITOT", "BILIDIR", "IBILI" in the last 72 hours. PT/INR No results for input(s): "LABPROT", "INR" in the last 72 hours.       Assessment / Plan:    #23 73 year old white female with history of symptomatic iron deficiency anemia, heme-negative on admission  GI workup has been completed. Capsule endoscopy negative  EGD-negative, biopsies pending for H. Pylori Colonoscopy with multiple colon polyps-not removed, multiple diverticuli in the sigmoid colon and internal hemorrhoids.  #2 COPD on chronic O2 #3 history of SCC of the lung status post lobectomy #4 history of A-fib on chronic Eliquis currently on hold #5 chronic pain syndrome  Plan; patient stable from GI perspective for discharge She will need to follow-up with her primary gastroenterologist Dr. Jason Fila is listed-as she will need to undergo colonoscopy with removal of the polyps at some point in the near future. Would benefit from hematology follow-up as an outpatient as well as she may require periodic iron infusions. GI will sign off   Principal Problem:   Acute on chronic anemia Active Problems:   Arthritis   Chronic pain   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)   Atrial fibrillation (HCC)   Right ventricular failure (HCC)   COPD (chronic obstructive pulmonary disease) (HCC)   Hypokalemia   Malignant neoplasm of  lung (HCC)   CKD (chronic kidney disease) stage 4, GFR 15-29 ml/min (HCC)   Chronic hypoxic respiratory failure (HCC)   Iron deficiency anemia   Anticoagulated   Tobacco use disorder   Hereditary and idiopathic peripheral neuropathy   Palliative care encounter   Pressure injury of skin   Metabolic alkalosis     LOS: 4 days   Nahjae Hoeg EsterwoodPA-C  07/06/2023, 8:26 AM

## 2023-07-06 NOTE — Plan of Care (Signed)

## 2023-07-06 NOTE — Care Management Important Message (Signed)
 Important Message  Patient Details  Name: Maria Arroyo MRN: [id number] Date of Birth: 11/28/1950   Important Message Given:  Yes - Medicare IM     Renie Ora 07/06/2023, 10:31 AM

## 2023-07-06 NOTE — Progress Notes (Signed)
                  Subjective:   Summary: 73 yo F with PMH of COPD on 3L home O2, SCC of lung s/p lobectomy, paroxysmal Afib on Eliquis, chronic pain, depression, admitted for acute on chronic IDA  Patient is feeling well today.  Her leg pain has been improving, she feels particularly after receiving blood transfusion yesterday. Denies dizziness or weakness while walking with PT yesterday.  Feels ready to go home whenever medically ready.  All questions answered.  Objective:  Vital signs in last 24 hours: Vitals:   07/05/23 2020 07/05/23 2045 07/05/23 2335 07/06/23 0407  BP: (!) 152/61  (!) 158/73 (!) 165/82  Pulse: (!) 58  (!) 59 (!) 57  Resp: 18  15 15   Temp: 98.8 F (37.1 C)  98.7 F (37.1 C) 98.5 F (36.9 C)  TempSrc: Oral  Oral Oral  SpO2: 97% 98% 95% 99%  Weight:    72.3 kg  Height:       Supplemental O2: Nasal Cannula SpO2: 99 % O2 Flow Rate (L/min): 3 L/min  Physical Exam:  Constitutional: chronically ill appearing, no acute distress Cardiovascular: RRR, no murmurs Pulmonary/Chest: lungs CTAB, distant lung sounds. No respiratory distress on 3L [place] Abdominal: soft, nontender, nondistended Neuro: A&O x 3  Assessment/Plan:   Acute on chronic anemia Hgb 8.4 today, which is the same as posttransfusion H&H from yesterday. Source of anemia remains unclear. EGD/colonoscopy performed 4/6 shows no evidence of overt bleeding. Capsule endoscopy performed by GI, awaiting results.  Patient could be medically ready for discharge today pending results of capsule study and any other recommendations by GI. - Results of capsule endoscopy pending. Appreciate GI assistance - Continue protonix 40mg  daily - Holding home Eliquis  Metabolic alkalosis with respiratory compensation ABG from 4/6 shows pH 7.52, pCO2 69.  Bicarb today is 37, improving.  Still unknown etiology of metabolic derangements, however as labs are trending in a positive direction, I feel comfortable with further workup  in the outpatient setting. - Plasma renin/aldosterone pending - Replete electrolytes as needed  Acute on chronic R hip pain Patient feels her hip and leg pain are improved today.  She feels things got a lot better after receiving the blood transfusion and iron yesterday.  This makes me wonder whether there is an element of restless leg syndrome contributing to her chronic leg pain. - Continue home gabapentin 300mg  BID - Capsaicin cream, aquathermia prn  Chronic hypoxic respiratory failure COPD HFpEF On home oxygen requirement. - Continue Brovana BID, Yupelri, duonebs q4h prn  Paroxysmal atrial fibrillation Telemetry shows continued sinus bradycardia, modestly improved from yesterday after holding home beta-blocker - Continue home amiodarone 200mg , holding home bisoprolol due to bradycardia - Hold home Eliquis  HTN Blood pressure has been high/normal. Continue home amiodarone 200mg  daily, discontinuing bisoprolol 2.5mg  daily due to bradycardia.  -Restarting home hydralazine 25mg  TID  Chronic stable conditions CKD4: trend renal function Depression: continue home buspirone 15mg  BID, venlafaxine 150mg  daily HLD: continue home rosuvastatin 10mg   Gout: continue home allopurinol 100mg  daily  Diet: Regular VTE: SCD Code: Full  Dispo: Anticipated discharge to Home pending medical stability.   Monna Fam, MD PGY-1 Internal Medicine Resident Pager Number [phone] Please contact the on call pager after 5 pm and on weekends at [phone].

## 2023-07-06 NOTE — TOC Initial Note (Addendum)
 Transition of Care [facility]) - Initial/Assessment Note    Patient Details  Name: Maria Arroyo MRN: [id number] Date of Birth: 1950/06/25  Transition of Care [facility]) CM/SW Contact:    Gala Lewandowsky, RN Phone Number: 07/06/2023, 11:09 AM  Clinical Narrative: Risk for readmission assessment completed. Patient presented for severe anemia. PTA patient was from home with spouse. Patient reports that she has DME: cane, rolling walker, bedside commode and oxygen via [employer]. Case Manager did call [employer] to confirm the liter flow- awaiting telephone call back. Case Manager discussed home health PT/OT and she is agreeable to services- Medicare.gov list provided in the room via computer and patient chose [facility]. Referral submitted and start of care to begin within 24-48 hours post transition home.                    1121 07-06-23 Patient's baseline liter flow is 3 liters continuous with [employer].   Expected Discharge Plan: Home w Home Health Services Barriers to Discharge: No Barriers Identified   Patient Goals and CMS Choice Patient states their goals for this hospitalization and ongoing recovery are:: Plan for home once stable.   Choice offered to / list presented to : Patient (Reviewed Medicare.gov list in the room on computer.)  Expected Discharge Plan and Services   Discharge Planning Services: CM Consult Post Acute Care Choice: Home Health Living arrangements for the past 2 months: Single Family Home    HH Arranged: PT, OT HH Agency: [facility] Date [facility] Agency Contacted: 07/06/23 Time HH Agency Contacted: 1108 Representative spoke with at [facility] Agency: Elnita Maxwell  Prior Living Arrangements/Services Living arrangements for the past 2 months: Single Family Home Lives with:: Spouse Patient language and need for interpreter reviewed:: Yes Do you feel safe going back to the place where you live?: Yes      Need for Family Participation in Patient Care: No  (Comment) Care giver support system in place?: No (comment) Current home services: DME (cane, rolling walker, bedside commode and oxygen via [employer]) Criminal Activity/Legal Involvement Pertinent to Current Situation/Hospitalization: No - Comment as needed  Activities of Daily Living   ADL Screening (condition at time of admission) Independently performs ADLs?: Yes (appropriate for developmental age) Is the patient deaf or have difficulty hearing?: Yes Does the patient have difficulty seeing, even when wearing glasses/contacts?: No Does the patient have difficulty concentrating, remembering, or making decisions?: No  Permission Sought/Granted Permission sought to share information with : Family Supports, Case Manager Permission granted to share information with : Yes, Verbal Permission Granted     Permission granted to share info w AGENCY: [facility]   Emotional Assessment Appearance:: Appears stated age Attitude/Demeanor/Rapport: Engaged Affect (typically observed): Appropriate Orientation: : Oriented to Self, Oriented to Place, Oriented to  Time, Oriented to Situation Alcohol / Substance Use: Not Applicable Psych Involvement: No (comment)  Admission diagnosis:  Symptomatic anemia [D64.9] Acute on chronic anemia [D64.9] Patient Active Problem List   Diagnosis Date Noted   Metabolic alkalosis 07/04/2023   Tobacco use disorder 07/03/2023   Hereditary and idiopathic peripheral neuropathy 07/03/2023   Palliative care encounter 07/03/2023   Pressure injury of skin 07/03/2023   Malignant neoplasm of lung (HCC) 07/02/2023   Acute on chronic anemia 07/02/2023   CKD (chronic kidney disease) stage 4, GFR 15-29 ml/min (HCC) 07/02/2023   Chronic hypoxic respiratory failure (HCC) 07/02/2023   Iron deficiency anemia 07/02/2023   Anticoagulated 07/02/2023   UTI (urinary tract infection) 05/15/2023  Acute on chronic respiratory failure with hypoxia and hypercapnia (HCC) 05/14/2023    COVID-19 virus infection 05/14/2023   Controlled type 2 diabetes mellitus without complication, without long-term current use of insulin (HCC) 05/14/2023   AKI (acute kidney injury) (HCC) 05/14/2023   Hypokalemia 05/14/2023   Hypophosphatemia 05/14/2023   COPD (chronic obstructive pulmonary disease) (HCC) 05/13/2023   COPD with acute exacerbation (HCC) 05/12/2023   Branch retinal vein occlusion with macular edema of left eye 12/31/2021   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   Cystoid macular edema of right eye 12/10/2021   Cystoid macular edema, left eye 12/10/2021   Branch retinal vein occlusion of right eye with macular edema 12/10/2021   Posterior vitreous detachment of both eyes 12/10/2021   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 11/07/2018   Right ventricular failure (HCC) 08/31/2018   Symptomatic anemia 08/26/2018   Atrial fibrillation (HCC) 08/26/2018   Recurrent right pleural effusion 08/26/2018   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) 04/28/2018   S/P lobectomy of lung 04/04/2018   Solitary pulmonary nodule 02/04/2018   Neuropathy 03/18/2017   Disturbance of skin sensation 03/10/2013   Cervical spondylosis with myelopathy 03/10/2013   Abnormality of gait 03/10/2013   Blood glucose elevated 11/18/2012   Arthritis 02/02/2011   Chronic pain 02/02/2011   Fibrositis 02/02/2011   Coronary artery disease    COPD GOLD II if use FEV1/VC ratio     Essential hypertension    Hyperlipidemia    Cigarette smoker    Depression    PCP:  Lanell Persons, MD Pharmacy:   [id number] [place], [place] - [address] AT [facility] [address] [place] [address] Phone: [phone] Fax: [phone]  Social Drivers of Health (SDOH) Social History: SDOH Screenings   Food Insecurity: Food Insecurity Present (07/03/2023)  Housing: Low Risk  (07/03/2023)  Transportation Needs: No Transportation Needs  (07/03/2023)  Utilities: Not At Risk (07/03/2023)  Depression (PHQ2-9): Low Risk  (03/17/2018)  Financial Resource Strain: Low Risk  (05/12/2023)   Received from [facility]  Physical Activity: Unknown (03/10/2023)   Received [facility]  Social Connections: Moderately Isolated (07/03/2023)  Stress: No Stress Concern Present (03/10/2023)   Received from [facility]  Tobacco Use: High Risk (07/04/2023)   Readmission Risk Interventions    07/06/2023   11:09 AM  Readmission Risk Prevention Plan  Transportation Screening Complete  HRI or Home Care Consult Complete  Social Work Consult for Recovery Care Planning/Counseling Complete  Palliative Care Screening Not Applicable  Medication Review Oceanographer) Referral to Pharmacy

## 2023-07-06 NOTE — Discharge Summary (Addendum)
 Name: Maria Arroyo MRN: [id number] DOB: 26-Feb-1951 73 y.o. PCP: Lanell Persons, MD  Date of Admission: 07/02/2023  6:53 AM Date of Discharge: 07/06/23 Attending Physician: Dr. 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)   Atrial fibrillation (HCC)   Right ventricular failure (HCC)   COPD (chronic obstructive pulmonary disease) (HCC)   Hypokalemia   Malignant neoplasm of lung (HCC)   CKD (chronic kidney disease) stage 4, GFR 15-29 ml/min (HCC)   Chronic hypoxic respiratory failure (HCC)   Iron deficiency anemia   Anticoagulated   Tobacco use disorder   Hereditary and idiopathic peripheral neuropathy   Palliative care encounter   Pressure injury of skin   Metabolic alkalosis    Discharge Medications: Allergies as of 07/06/2023       Reactions   Ace Inhibitors Other (See Comments)   Worsening renal function   Codeine Nausea Only        Medication List     PAUSE taking these medications    bisoprolol 5 MG tablet Wait to take this until your doctor or other care provider tells you to start again. Commonly known as: ZEBETA Take 0.5 tablets (2.5 mg total) by mouth daily.       TAKE these medications    albuterol 108 (90 Base) MCG/ACT inhaler Commonly known as: VENTOLIN HFA Inhale 2 puffs into the lungs every 6 (six) hours as needed for shortness of breath or wheezing.   allopurinol 100 MG tablet Commonly known as: ZYLOPRIM Take 100 mg by mouth daily.   amiodarone 200 MG tablet Commonly known as: PACERONE Take 1 tablet (200 mg total) by mouth daily.   apixaban 5 MG Tabs tablet Commonly known as: ELIQUIS Take 1 tablet (5 mg total) by mouth 2 (two) times daily.   busPIRone 15 MG tablet Commonly known as: BUSPAR Take 15 mg by mouth 2 (two) times daily.   fluticasone 50 MCG/ACT nasal spray Commonly known as:  FLONASE Place 2 sprays into both nostrils daily for 7 days.   gabapentin 300 MG capsule Commonly known as: NEURONTIN Take 1 capsule (300 mg total) by mouth 2 (two) times daily. What changed: Another medication with the same name was removed. Continue taking this medication, and follow the directions you see here.   hydrALAZINE 25 MG tablet Commonly known as: APRESOLINE Take 1 tablet (25 mg total) by mouth every 8 (eight) hours.   hydrochlorothiazide 12.5 MG tablet Commonly known as: HYDRODIURIL Take 12.5 mg by mouth daily.   Iron (Ferrous Sulfate) 325 (65 Fe) MG Tabs Take 1 tablet by mouth daily.   isosorbide dinitrate 10 MG tablet Commonly known as: ISORDIL Take 1 tablet (10 mg total) by mouth 3 (three) times daily.   loratadine 10 MG tablet Commonly known as: CLARITIN Take 1 tablet (10 mg total) by mouth daily.   multivitamin with minerals Tabs tablet Take 1 tablet by mouth daily.   nitroGLYCERIN 0.4 MG SL tablet Commonly known as: NITROSTAT PLACE 1 TABLET UNDER THE TONGUE EVERY 5 MINUTES AS NEEDED CHEST PAIN. What changed: See the new instructions.   pantoprazole 40 MG tablet Commonly known as: PROTONIX Take 1 tablet (40 mg total) by mouth daily at 6 (six) AM.   rosuvastatin 40 MG tablet Commonly known as: CRESTOR TAKE 1 TABLET BY MOUTH DAILY   torsemide 20 MG tablet Commonly  known as: DEMADEX Take 1 tablet (20 mg total) by mouth daily.   venlafaxine XR 150 MG 24 hr capsule Commonly known as: EFFEXOR-XR Take 150 mg by mouth daily.   VITAMIN C PO Take 1 tablet by mouth daily.   Vitamin D 50 MCG (2000 UT) tablet Take 2,000 Units by mouth daily.       Disposition and follow-up:   Ms.Maria Arroyo was discharged from [facility] in Stable condition.  At the hospital follow up visit please address:  1.  Follow-up:  Anemia: Recommend rechecking CBC at PCP follow up. Follow up colonoscopy recommended by GI for evaluation of  polyps.     Metabolic alkalosis: unknown etiology of alkalosis. Recommend follow up BMP and checking plasma aldosterone/renin ration that had not resulted during admission   Chronic hip and leg pain: worsening of chronic pain, possibly related to restless leg syndrome. Steroid hip injection performed several months ago was also helpful.    HTN: patient is not totally aware which medications she takes for her blood pressure. Check BP and confirm medication list  2.  Labs / imaging needed at time of follow-up: BMP, CBC  3.  Pending labs/ test needing follow-up: plasma aldosterone/renin   4.  Medication Changes  Started: Iron Sulfate 325mg  daily  Stopped: Bisoprolol  Changed:  Abx -   End Date:  Follow-up Appointments:  Follow-up Information     Care, [facility] Follow up.   Why: Physical and Occupational Therapy-office to call with visit times. Contact information: [address] [place] [address] [phone]         Konrad Penta, MD. Schedule an appointment as soon as possible for a visit in 1 month(s).   Specialty: Gastroenterology Why: Please make a follow up appointment with Dr Jason Fila within approximately 1 month or as available Contact information: [address] [place] [address] [phone]                Hospital Course by problem list:  Acute on chronic anemia  Patient presented to the ED after having labs checked at her PCP's office which showed hemoglobin of 6.4. In the ED hemoglobin had dropped to 5.6.  She endorsed worsening weakness for the past several weeks and some dizziness with ambulation.  Patient received 1 unit of PRBCs with improvement of her hemoglobin.  GI was consulted and EGD and colonoscopy were performed, neither of which showed any evidence of acute bleeding. Capsule endoscopy likewise did not show evidence of GI bleeding.  Patient required 2 units of PRBCs total during this hospital  admission, after which hemoglobin remained stable.  On 07/06/23 GI cleared patient for hospital discharge with follow-up with her gastroenterologist Dr Jason Fila as well as repeat colonoscopy in the future. She should continue her PPI .  PT recommended home health physical therapy.  Metabolic alkalosis Labs showed increase in bicarb to 43.  ABG confirmed hypercarbic metabolic alkalosis with respiratory compensation.  Unsure etiology of electrolyte derangements, could be related to chronic hypercapnic respiratory failure related to her COPD.  As labs were trending in a positive direction at the time of discharge, patient could be discharged with PCP follow-up.  Acute on chronic back, hip, and leg pain On arrival to the ED, patient endorsed worsening of her chronic low back, right hip, and posterior leg pain.  She did have some improvement of her leg pain with blood and IV iron transfusion, which may indicate  an element of restless leg syndrome contributing to her pain.  Pain had improved at time of discharge, though will likely need further outpatient follow-up  Paroxysmal atrial fibrillation Telemetry showed sinus bradycardia during admission. Amiodarone was continued, home bisoprolol was discontinued due to bradycardia. Eliquis was restarted at discharge.   Chronic hypoxic respiratory failure HFpEF   COPD Patient uses 3L of supplemental oxygen via nasal cannula at home. In the ED there was  concern for acute heart failure exacerbation, though she was able to maintain appropriate O2 sats with her home oxygen requirement. She received IV Lasix x 1 with no further diuresis during this admission.  HTN: home antihypertensives used for BP control CKD4: renal function at baseline Depression: Home buspirone, venlafaxine continued HLD: Home rosuvastatin continued Gout: Home allopurinol continued   Discharge Subjective: Patient is feeling well today.  Her leg pain has been improving, she feels particularly  after receiving blood transfusion yesterday. Denies dizziness or weakness while walking with PT yesterday.  Feels ready to go home whenever medically ready.  All questions answered.   Discharge Exam:   BP (!) 151/64 (BP Location: Right Arm)   Pulse 60   Temp 98.2 F (36.8 C) (Oral)   Resp 18   Ht 5\' 1"  (1.549 m)   Wt 72.3 kg   SpO2 96%   BMI 30.12 kg/m  Constitutional: chronically ill appearing, no acute distress Cardiovascular: RRR, no murmurs Pulmonary/Chest: lungs CTAB, distant lung sounds. No respiratory distress on 3L [place] Abdominal: soft, nontender, nondistended Neuro: A&O x 3  Pertinent Labs, Studies, and Procedures:     Latest Ref Rng & Units 07/06/2023    4:56 AM 07/05/2023    2:28 PM 07/05/2023    5:20 AM  CBC  WBC 4.0 - 10.5 K/uL 11.4   10.0   Hemoglobin 12.0 - 15.0 g/dL 8.4  8.4  6.8   Hematocrit 36.0 - 46.0 % 28.5  28.4  23.4   Platelets 150 - 400 K/uL 319   328        Latest Ref Rng & Units 07/06/2023    4:56 AM 07/05/2023    5:20 AM 07/04/2023    4:58 PM  CMP  Glucose 70 - 99 mg/dL 409  92  811   BUN 8 - 23 mg/dL 19  14  17    Creatinine 0.44 - 1.00 mg/dL 9.14  7.82  9.56   Sodium 135 - 145 mmol/L 132  137  137   Potassium 3.5 - 5.1 mmol/L 4.3  3.3  3.1   Chloride 98 - 111 mmol/L 85  87  83   CO2 22 - 32 mmol/L 37  39  40   Calcium 8.9 - 10.3 mg/dL 8.9  9.1  9.1     DG Chest Portable 1 View Result Date: 07/02/2023 CLINICAL DATA:  73 year old female with shortness of breath. EXAM: PORTABLE CHEST 1 VIEW COMPARISON:  Chest radiographs 05/12/2023 and earlier. FINDINGS: Portable AP semi upright view at 0730 hours. Ongoing right pleural effusion, small to moderate. More conspicuous fluid in the right minor fissure now. Underlying cardiomegaly. Stable cardiac size and mediastinal contours. Visualized tracheal air column is within normal limits. Stable pulmonary vascularity, mild interstitial edema is possible. No consolidation. But centrilobular emphysema confirmed on a 2023  chest CT. Cervical ACDF. Degenerative changes in the spine. Negative visible bowel gas pattern. IMPRESSION: Ongoing right pleural effusion, small to moderate. Chronic cardiomegaly. Emphysema (ICD10-J43.9). Mild interstitial edema is possible. Electronically Signed   By:  Odessa Fleming M.D.   On: 07/02/2023 07:40    Signed: Monna Fam, MD PGY-1 07/06/2023, 2:51 PM   Pager: [phone]

## 2023-07-06 NOTE — Progress Notes (Signed)
 Occupational Therapy Treatment Patient Details Name: Maria Arroyo MRN: [id number] DOB: [birth year] Today's Date: 07/06/2023   History of present illness 73 y/o female presented 07/02/23 with SOB for the past several days and found to have an acute COPD exacerbation on arrival.  Her respiratory panel is positive for COVID-19 though fortunately chest x-ray not showing any focal pneumonia. PMH includes chronic hypoxic respiratory failure on 3 L nasal cannula at home, COPD, paroxysmal A-fib, chronic diastolic CHF, hypertension, hyperlipidemia, controlled type 2 diabetes, stage IIIB CKD, squamous cell cancer of right lung status post lobectomy in 2020   OT comments  Pt making progress with functional goals. O2 SATs 87% on 3L during in room activity for toilet transfers, toileting, grooming and dressing tasks. OT will continue to follow acutely to maximize level of function and safety      If plan is discharge home, recommend the following:  A little help with walking and/or transfers;Assist for transportation;Help with stairs or ramp for entrance;Assistance with cooking/housework;A little help with bathing/dressing/bathroom   Equipment Recommendations  None recommended by OT    Recommendations for Other Services      Precautions / Restrictions Precautions Precautions: Fall Precaution/Restrictions Comments: O2 Restrictions Weight Bearing Restrictions Per Provider Order: No       Mobility Bed Mobility Overal bed mobility: Needs Assistance Bed Mobility: Supine to Sit, Sit to Supine     Supine to sit: HOB elevated, Supervision Sit to supine: Supervision        Transfers Overall transfer level: Needs assistance Equipment used: Rolling walker (2 wheels) Transfers: Sit to/from Stand, Bed to chair/wheelchair/BSC Sit to Stand: Contact guard assist, Supervision     Step pivot transfers: Supervision     General transfer comment: assisted for safety and lines     Balance  Overall balance assessment: Needs assistance Sitting-balance support: Feet supported Sitting balance-Leahy Scale: Good     Standing balance support: Bilateral upper extremity supported, During functional activity Standing balance-Leahy Scale: Poor                             ADL either performed or assessed with clinical judgement   ADL Overall ADL's : Needs assistance/impaired     Grooming: Wash/dry hands;Wash/dry face;Supervision/safety;Sitting         Lower Body Bathing Details (indicate cue type and reason): simulated seated EOB Upper Body Dressing : Contact guard assist;Sitting Upper Body Dressing Details (indicate cue type and reason): seated on commode     Toilet Transfer: Contact guard assist;Supervision/safety;Ambulation;Rolling walker (2 wheels);Grab bars   Toileting- Clothing Manipulation and Hygiene: Minimal assistance;Sit to/from stand       Functional mobility during ADLs: Contact guard assist;Supervision/safety;Rolling walker (2 wheels)      Extremity/Trunk Assessment Upper Extremity Assessment Upper Extremity Assessment: Generalized weakness   Lower Extremity Assessment Lower Extremity Assessment: Defer to PT evaluation        Vision Ability to See in Adequate Light: 0 Adequate Patient Visual Report: No change from baseline     Perception     Praxis     Communication Communication Communication: No apparent difficulties   Cognition Arousal: Alert Behavior During Therapy: WFL for tasks assessed/performed Cognition: No apparent impairments                               Following commands: Intact        Cueing  Exercises      Shoulder Instructions       General Comments      Pertinent Vitals/ Pain       Pain Assessment Pain Assessment: No/denies pain Pain Score: 0-No pain Pain Intervention(s): Monitored during session, Repositioned  Home Living                                           Prior Functioning/Environment              Frequency  Min 2X/week        Progress Toward Goals  OT Goals(current goals can now be found in the care plan section)  Progress towards OT goals: Progressing toward goals     Plan      Co-evaluation                 AM-PAC OT "6 Clicks" Daily Activity     Outcome Measure   Help from another person eating meals?: None Help from another person taking care of personal grooming?: A Little Help from another person toileting, which includes using toliet, bedpan, or urinal?: A Little Help from another person bathing (including washing, rinsing, drying)?: A Lot Help from another person to put on and taking off regular upper body clothing?: A Little Help from another person to put on and taking off regular lower body clothing?: A Lot 6 Click Score: 17    End of Session Equipment Utilized During Treatment: Rolling walker (2 wheels);Oxygen;Gait belt  OT Visit Diagnosis: Unsteadiness on feet (R26.81);Repeated falls (R29.6);History of falling (Z91.81);Muscle weakness (generalized) (M62.81)   Activity Tolerance Patient tolerated treatment well   Patient Left in bed;with call bell/phone within reach   Nurse Communication          Time: 1208-1224 OT Time Calculation (min): 16 min  Charges: OT General Charges $OT Visit: 1 Visit OT Treatments $Self Care/Home Management : 8-22 mins    Galen Manila 07/06/2023, 1:33 PM

## 2023-07-06 NOTE — Discharge Instructions (Addendum)
 Ms Makinze, Jani were hospitalized for anemia. You received multiple blood transfusions, and your hemoglobin is now at a safe level. Thank you for allowing [place] to be part of your care.   Please make a follow up appointment with your PCP within the next 1-2 weeks.   Please also make a follow up appointment with your gastroenterologist within the next month.   Please note these changes made to your medications:   *Please START taking:  Iron tablet once daily  *Please STOP taking:  Bisoprolol until restarted by your PCP  Please make sure to return to the hospital if you have worsening weakness, dizziness, or have dark/tarry bowel movements.   Thanks,  Dr Carlynn Purl

## 2023-07-08 NOTE — Progress Notes (Signed)
  Progress Note   Date: 07/06/2023  Patient Name: Maria Arroyo        MRN#: [id number]  Clarification of diagnosis: high output heart failure due to severe anemia

## 2023-07-08 NOTE — Progress Notes (Signed)
  Progress Note   Date: 07/06/2023  Patient Name: Maria Arroyo        MRN#: [id number]  The diagnosis of AKI     AKI ruled in (document the clinical data/criteria used for diagnosing)

## 2023-07-08 NOTE — Progress Notes (Signed)
  Progress Note   Date: 07/06/2023  Patient Name: Maria Arroyo        MRN#: [id number]  Clarification of diagnosis: chronic hypoxic respiratory failure

## 2023-07-14 LAB — ALDOSTERONE + RENIN ACTIVITY W/ RATIO
ALDO / PRA Ratio: 1 (ref 0.0–30.0)
Aldosterone: 1.8 ng/dL (ref 0.0–30.0)
PRA LC/MS/MS: 1.78 ng/mL/h (ref 0.167–5.380)

## 2023-07-15 ENCOUNTER — Encounter (HOSPITAL_COMMUNITY): Payer: Self-pay | Admitting: *Deleted

## 2023-07-15 ENCOUNTER — Other Ambulatory Visit: Payer: Self-pay

## 2023-07-15 ENCOUNTER — Emergency Department (HOSPITAL_COMMUNITY)

## 2023-07-15 ENCOUNTER — Emergency Department (HOSPITAL_COMMUNITY)
Admission: EM | Admit: 2023-07-15 | Discharge: 2023-07-15 | Disposition: A | Discharge status: Home / Self Care | Attending: Emergency Medicine | Admitting: Emergency Medicine

## 2023-07-15 DIAGNOSIS — Z85118 Personal history of other malignant neoplasm of bronchus and lung: Secondary | ICD-10-CM | POA: Diagnosis not present

## 2023-07-15 DIAGNOSIS — I480 Paroxysmal atrial fibrillation: Secondary | ICD-10-CM | POA: Diagnosis not present

## 2023-07-15 DIAGNOSIS — J90 Pleural effusion, not elsewhere classified: Secondary | ICD-10-CM | POA: Diagnosis not present

## 2023-07-15 DIAGNOSIS — R7989 Other specified abnormal findings of blood chemistry: Secondary | ICD-10-CM | POA: Insufficient documentation

## 2023-07-15 DIAGNOSIS — I700 Atherosclerosis of aorta: Secondary | ICD-10-CM | POA: Insufficient documentation

## 2023-07-15 DIAGNOSIS — Z7901 Long term (current) use of anticoagulants: Secondary | ICD-10-CM | POA: Diagnosis not present

## 2023-07-15 DIAGNOSIS — J449 Chronic obstructive pulmonary disease, unspecified: Secondary | ICD-10-CM | POA: Diagnosis not present

## 2023-07-15 DIAGNOSIS — Z87891 Personal history of nicotine dependence: Secondary | ICD-10-CM | POA: Insufficient documentation

## 2023-07-15 DIAGNOSIS — I2510 Atherosclerotic heart disease of native coronary artery without angina pectoris: Secondary | ICD-10-CM | POA: Diagnosis not present

## 2023-07-15 DIAGNOSIS — Z79899 Other long term (current) drug therapy: Secondary | ICD-10-CM | POA: Diagnosis not present

## 2023-07-15 LAB — BASIC METABOLIC PANEL WITH GFR
Anion gap: 13 (ref 5–15)
BUN: 36 mg/dL — ABNORMAL HIGH (ref 8–23)
CO2: 36 mmol/L — ABNORMAL HIGH (ref 22–32)
Calcium: 9.1 mg/dL (ref 8.9–10.3)
Chloride: 86 mmol/L — ABNORMAL LOW (ref 98–111)
Creatinine, Ser: 2.22 mg/dL — ABNORMAL HIGH (ref 0.44–1.00)
GFR, Estimated: 23 mL/min — ABNORMAL LOW (ref >60)
Glucose, Bld: 127 mg/dL — ABNORMAL HIGH (ref 70–99)
Potassium: 3.3 mmol/L — ABNORMAL LOW (ref 3.5–5.1)
Sodium: 135 mmol/L (ref 135–145)

## 2023-07-15 LAB — CBC
HCT: 34 % — ABNORMAL LOW (ref 36.0–46.0)
Hemoglobin: 10 g/dL — ABNORMAL LOW (ref 12.0–15.0)
MCH: 25.1 pg — ABNORMAL LOW (ref 26.0–34.0)
MCHC: 29.4 g/dL — ABNORMAL LOW (ref 30.0–36.0)
MCV: 85.4 fL (ref 80.0–100.0)
Platelets: 273 10*3/uL (ref 150–400)
RBC: 3.98 MIL/uL (ref 3.87–5.11)
RDW: 21.6 % — ABNORMAL HIGH (ref 11.5–15.5)
WBC: 8.6 10*3/uL (ref 4.0–10.5)
nRBC: 0 % (ref 0.0–0.2)

## 2023-07-15 LAB — TROPONIN I (HIGH SENSITIVITY)
Troponin I (High Sensitivity): 35 ng/L — ABNORMAL HIGH (ref <18)
Troponin I (High Sensitivity): 34 ng/L — ABNORMAL HIGH (ref <18)

## 2023-07-15 LAB — BRAIN NATRIURETIC PEPTIDE: B Natriuretic Peptide: 967.4 pg/mL — ABNORMAL HIGH (ref 0.0–100.0)

## 2023-07-15 NOTE — ED Provider Notes (Addendum)
 [place] EMERGENCY DEPARTMENT AT [facility] Provider Note   CSN: [id number] Arrival date & time: 07/15/23  6295     History  Chief Complaint  Patient presents with   Abnormal Lab    Maria Arroyo is a 73 y.o. female.  HPI 34 female history of copd, smoker, p afib, cad, lun cancer, recent admission and d/c for symptomatic anemia.  Patient had blood transfused. Patient went for f/u with pmd and had labs drawn.  She was called and told to come to ED due to abnormal labs- per triage note.  Patient states Dr. Jillyn Hidden her pcp called her yesterday due to abnorma results.  Patient states she feels better than she has for a while.  She denies any increased dyspnea. Labs obtained here pte, stable hgb, creatinine increased to 2.2 on lasix 40 mg which she has been on and bnp at 967.      Home Medications Prior to Admission medications   Medication Sig Start Date End Date Taking? Authorizing Provider  albuterol (VENTOLIN HFA) 108 (90 Base) MCG/ACT inhaler Inhale 2 puffs into the lungs every 6 (six) hours as needed for shortness of breath or wheezing. 02/17/23   [provider]  allopurinol (ZYLOPRIM) 100 MG tablet Take 100 mg by mouth daily.     [provider]  amiodarone (PACERONE) 200 MG tablet Take 1 tablet (200 mg total) by mouth daily. 12/09/22   Duke, Roe Rutherford, PA  apixaban (ELIQUIS) 5 MG TABS tablet Take 1 tablet (5 mg total) by mouth 2 (two) times daily. 12/09/22   Duke, Roe Rutherford, PA  Ascorbic Acid (VITAMIN C PO) Take 1 tablet by mouth daily.    [provider]  bisoprolol (ZEBETA) 5 MG tablet Take 0.5 tablets (2.5 mg total) by mouth daily. 12/09/22   Duke, Roe Rutherford, PA  busPIRone (BUSPAR) 15 MG tablet Take 15 mg by mouth 2 (two) times daily.    [provider]  Cholecalciferol (VITAMIN D) 2000 UNITS tablet Take 2,000 Units by mouth daily.    [provider]  fluticasone (FLONASE) 50 MCG/ACT nasal spray  Place 2 sprays into both nostrils daily for 7 days. 05/17/23 07/03/23  Rodolph Bong, MD  gabapentin (NEURONTIN) 300 MG capsule Take 1 capsule (300 mg total) by mouth 2 (two) times daily. Patient not taking: Reported on 07/02/2023 05/16/23   Rodolph Bong, MD  hydrALAZINE (APRESOLINE) 25 MG tablet Take 1 tablet (25 mg total) by mouth every 8 (eight) hours. 05/16/23   Rodolph Bong, MD  hydrochlorothiazide (HYDRODIURIL) 12.5 MG tablet Take 12.5 mg by mouth daily.    [provider]  Iron, Ferrous Sulfate, 325 (65 Fe) MG TABS Take 1 tablet by mouth daily. 07/06/23   Monna Fam, MD  isosorbide dinitrate (ISORDIL) 10 MG tablet Take 1 tablet (10 mg total) by mouth 3 (three) times daily. 05/16/23   Rodolph Bong, MD  loratadine (CLARITIN) 10 MG tablet Take 1 tablet (10 mg total) by mouth daily. 05/17/23   Rodolph Bong, MD  Multiple Vitamin (MULTIVITAMIN WITH MINERALS) TABS tablet Take 1 tablet by mouth daily. 08/30/18   Roberto Scales D, MD  nitroGLYCERIN (NITROSTAT) 0.4 MG SL tablet PLACE 1 TABLET UNDER THE TONGUE EVERY 5 MINUTES AS NEEDED CHEST PAIN. Patient taking differently: Place 0.4 mg under the tongue every 5 (five) minutes as needed for chest pain. 05/25/22   [place], Peter M, MD  pantoprazole (PROTONIX) 40 MG tablet Take 1 tablet (  40 mg total) by mouth daily at 6 (six) AM. 05/17/23   Armenta Landau, MD  rosuvastatin (CRESTOR) 40 MG tablet TAKE 1 TABLET BY MOUTH DAILY 12/09/22   Duke, Warren Haber, PA  torsemide (DEMADEX) 20 MG tablet Take 1 tablet (20 mg total) by mouth daily. 12/09/22   Lamond Pilot, PA  venlafaxine XR (EFFEXOR-XR) 150 MG 24 hr capsule Take 150 mg by mouth daily.    [provider]      Allergies    Ace inhibitors and Codeine    Review of Systems   Review of Systems  Physical Exam Updated Vital Signs BP (!) 175/106   Pulse 72   Temp (!) 97.5 F (36.4 C)   Resp 17   SpO2 93%  Physical Exam Vitals reviewed.  Constitutional:       General: She is not in acute distress.    Appearance: She is ill-appearing.  HENT:     Head: Normocephalic.     Right Ear: External ear normal.     Left Ear: External ear normal.     Nose: Nose normal.     Mouth/Throat:     Pharynx: Oropharynx is clear.  Eyes:     Pupils: Pupils are equal, round, and reactive to light.  Pulmonary:     Effort: Pulmonary effort is normal.     Breath sounds: Normal breath sounds.  Abdominal:     General: Abdomen is flat.     Palpations: Abdomen is soft.  Musculoskeletal:        General: Normal range of motion.     Cervical back: Normal range of motion.  Skin:    General: Skin is warm and dry.     Capillary Refill: Capillary refill takes less than 2 seconds.  Neurological:     General: No focal deficit present.     Mental Status: She is alert.  Psychiatric:        Mood and Affect: Mood normal.     ED Results / Procedures / Treatments   Labs (all labs ordered are listed, but only abnormal results are displayed) Labs Reviewed  BASIC METABOLIC PANEL WITH GFR - Abnormal; Notable for the following components:      Result Value   Potassium 3.3 (*)    Chloride 86 (*)    CO2 36 (*)    Glucose, Bld 127 (*)    BUN 36 (*)    Creatinine, Ser 2.22 (*)    GFR, Estimated 23 (*)    All other components within normal limits  CBC - Abnormal; Notable for the following components:   Hemoglobin 10.0 (*)    HCT 34.0 (*)    MCH 25.1 (*)    MCHC 29.4 (*)    RDW 21.6 (*)    All other components within normal limits  BRAIN NATRIURETIC PEPTIDE - Abnormal; Notable for the following components:   B Natriuretic Peptide 967.4 (*)    All other components within normal limits  TROPONIN I (HIGH SENSITIVITY) - Abnormal; Notable for the following components:   Troponin I (High Sensitivity) 35 (*)    All other components within normal limits  TROPONIN I (HIGH SENSITIVITY) - Abnormal; Notable for the following components:   Troponin I (High Sensitivity) 34 (*)     All other components within normal limits    EKG EKG Interpretation Date/Time:  Thursday July 15 2023 08:04:20 EDT Ventricular Rate:  67 PR Interval:  192 QRS Duration:  110 QT Interval:  436 QTC Calculation: 460 R Axis:   12  Text Interpretation: Normal sinus rhythm since last tracing qrs has narrowed no stemi Confirmed by Auston Blush [phone]) on 07/15/2023 2:54:14 PM  Radiology DG Chest 2 View Result Date: 07/15/2023 CLINICAL DATA:  73 year old female with chest pain, shortness of breath, elevated BNP. EXAM: CHEST - 2 VIEW COMPARISON:  Portable chest 07/02/2023 and earlier. FINDINGS: AP and lateral views of the chest 0810 hours. Large lung volumes with centrilobular emphysema confirmed on 2023 CT. Chronic architectural distortion and/or loculated pleural effusion at the right lung base appears stable since that time. Stable cardiac size and mediastinal contours. Borderline to mild cardiomegaly. Calcified aortic atherosclerosis. Visualized tracheal air column is within normal limits. No pneumothorax or acute pleural effusion. No consolidation. Pulmonary vascularity is stable from earlier this month. Cervical ACDF. Osteopenia. No acute osseous abnormality identified. Negative visible bowel gas. IMPRESSION: 1. Chronic Emphysema (ICD10-J43.9) and right basilar scarring/pleural effusion. Stable pulmonary vascularity from earlier this month, difficult to exclude mild interstitial edema. 2. No new cardiopulmonary abnormality. 3. Aortic Atherosclerosis (ICD10-I70.0). Electronically Signed   By: Marlise Simpers M.D.   On: 07/15/2023 09:21    Procedures Procedures    Medications Ordered in ED Medications - No data to display  ED Course/ Medical Decision Making/ A&P                                 Medical Decision Making  1- copd- some wheezing no acute distress 2- elevated bnp- clinically patient stable.  On same dose lasix.  Discussed with cardiology Dr. Tita Form notes op probnp elevated consistently  with today's 3- troponin 34>35 no delta change- no chest pain suspect some mild elevation with copd and ho afib.  NO evidence of acute ischemia-EKG without acute ischemic changes, no tachycardia, chest pain 4-creatinine increased to2.22 from baseline 1.58 5- mild hypokalemia- will advise re repletion  Patient appears at baseline and would like to go home.  Although patient with chronic health problems and some lab abnomalities, appears well for her. Advised regarding need for follow up and return precautions.  Discussed with Dr. Roxy Cordial  who was able to review op labs and agrees with above plan       Final Clinical Impression(s) / ED Diagnoses Final diagnoses:  Elevated brain natriuretic peptide (BNP) level  Blood creatinine increased compared with prior measurement    Rx / DC Orders ED Discharge Orders     None         Auston Blush, MD 07/15/23 1511    Auston Blush, MD 07/15/23 1517

## 2023-07-15 NOTE — ED Notes (Signed)
 Spouse making phone call to check with PCP what the reason was that they told her to come up here yesterday

## 2023-07-15 NOTE — ED Triage Notes (Signed)
 Pt arrives with spouse and states that Dr. Mildred All her PCP called her yesterday and told her to come up here due to results from blood work.  Pt is not able to give any additional information.  Pt had the blood work Tuesday as a recheck after hospitalization.  Pt is on 3L [place] home O2 and smokes "a couple of packs of cigarettes" no acute distress noted in triage.

## 2023-07-15 NOTE — ED Notes (Signed)
 NT gave pt some oxygen 3 liters

## 2023-07-15 NOTE — ED Provider Triage Note (Signed)
 Emergency Medicine Provider Triage Evaluation Note  Cherylanne Ardelean , a 73 y.o. female  was evaluated in triage.  Pt complains of sent in by her pmd for evaluation.  Review of Systems  Positive: Oxygen dependent,  Negative: No fever, cough,   Physical Exam  BP (!) 175/106   Pulse 72   Temp (!) 97.5 F (36.4 C)   Resp 17   SpO2 93%  Gen:   Awake, no distress   Resp:  Normal effort  MSK:   Moves extremities without difficulty  Other:  Chronically ill appearing  Medical Decision Making  Medically screening exam initiated at 2:36 PM.  Appropriate orders placed.  Morgen Linebaugh was informed that the remainder of the evaluation will be completed by another provider, this initial triage assessment does not replace that evaluation, and the importance of remaining in the ED until their evaluation is complete.  Patient states be   Auston Blush, MD 07/15/23 1459

## 2023-07-15 NOTE — ED Notes (Signed)
 Pt.s husband came up to desk and verbalized they would like to leave an have lab results called to them.  I encouraged them to stay and see our EDP  Pt.s husband is going to call their PCP.

## 2023-07-15 NOTE — Discharge Instructions (Signed)
 Return if you are worsening at any time. Follow up with your cardiologist ASAP

## 2023-07-15 NOTE — ED Notes (Signed)
 Pt is feeling alright, no distress

## 2023-07-15 NOTE — ED Notes (Signed)
 Spouse was able to reach PCP and they wanted her to come here due to BNP 10000

## 2023-08-11 ENCOUNTER — Inpatient Hospital Stay (HOSPITAL_COMMUNITY)
Admission: EM | Admit: 2023-08-11 | Discharge: 2023-08-14 | DRG: 683 | Disposition: A | Discharge status: Home Health | Attending: Internal Medicine | Admitting: Internal Medicine

## 2023-08-11 ENCOUNTER — Other Ambulatory Visit: Payer: Self-pay

## 2023-08-11 ENCOUNTER — Emergency Department (HOSPITAL_COMMUNITY)

## 2023-08-11 DIAGNOSIS — E66811 Obesity, class 1: Secondary | ICD-10-CM | POA: Diagnosis present

## 2023-08-11 DIAGNOSIS — N184 Chronic kidney disease, stage 4 (severe): Secondary | ICD-10-CM | POA: Diagnosis present

## 2023-08-11 DIAGNOSIS — N189 Chronic kidney disease, unspecified: Secondary | ICD-10-CM | POA: Diagnosis present

## 2023-08-11 DIAGNOSIS — Z8249 Family history of ischemic heart disease and other diseases of the circulatory system: Secondary | ICD-10-CM

## 2023-08-11 DIAGNOSIS — Z85118 Personal history of other malignant neoplasm of bronchus and lung: Secondary | ICD-10-CM

## 2023-08-11 DIAGNOSIS — M21372 Foot drop, left foot: Secondary | ICD-10-CM | POA: Diagnosis present

## 2023-08-11 DIAGNOSIS — Z955 Presence of coronary angioplasty implant and graft: Secondary | ICD-10-CM

## 2023-08-11 DIAGNOSIS — Z888 Allergy status to other drugs, medicaments and biological substances status: Secondary | ICD-10-CM

## 2023-08-11 DIAGNOSIS — E876 Hypokalemia: Secondary | ICD-10-CM | POA: Diagnosis present

## 2023-08-11 DIAGNOSIS — D631 Anemia in chronic kidney disease: Secondary | ICD-10-CM | POA: Diagnosis present

## 2023-08-11 DIAGNOSIS — Z6830 Body mass index (BMI) 30.0-30.9, adult: Secondary | ICD-10-CM

## 2023-08-11 DIAGNOSIS — Z981 Arthrodesis status: Secondary | ICD-10-CM

## 2023-08-11 DIAGNOSIS — J449 Chronic obstructive pulmonary disease, unspecified: Secondary | ICD-10-CM | POA: Diagnosis present

## 2023-08-11 DIAGNOSIS — Z885 Allergy status to narcotic agent status: Secondary | ICD-10-CM

## 2023-08-11 DIAGNOSIS — F17210 Nicotine dependence, cigarettes, uncomplicated: Secondary | ICD-10-CM | POA: Diagnosis present

## 2023-08-11 DIAGNOSIS — I130 Hypertensive heart and chronic kidney disease with heart failure and stage 1 through stage 4 chronic kidney disease, or unspecified chronic kidney disease: Secondary | ICD-10-CM | POA: Diagnosis present

## 2023-08-11 DIAGNOSIS — R9431 Abnormal electrocardiogram [ECG] [EKG]: Secondary | ICD-10-CM | POA: Diagnosis present

## 2023-08-11 DIAGNOSIS — D649 Anemia, unspecified: Secondary | ICD-10-CM

## 2023-08-11 DIAGNOSIS — G894 Chronic pain syndrome: Secondary | ICD-10-CM | POA: Diagnosis present

## 2023-08-11 DIAGNOSIS — F419 Anxiety disorder, unspecified: Secondary | ICD-10-CM | POA: Diagnosis present

## 2023-08-11 DIAGNOSIS — E1122 Type 2 diabetes mellitus with diabetic chronic kidney disease: Secondary | ICD-10-CM | POA: Diagnosis present

## 2023-08-11 DIAGNOSIS — I10 Essential (primary) hypertension: Secondary | ICD-10-CM | POA: Diagnosis present

## 2023-08-11 DIAGNOSIS — J9611 Chronic respiratory failure with hypoxia: Secondary | ICD-10-CM | POA: Diagnosis not present

## 2023-08-11 DIAGNOSIS — I480 Paroxysmal atrial fibrillation: Secondary | ICD-10-CM | POA: Diagnosis present

## 2023-08-11 DIAGNOSIS — I447 Left bundle-branch block, unspecified: Secondary | ICD-10-CM | POA: Diagnosis present

## 2023-08-11 DIAGNOSIS — R7989 Other specified abnormal findings of blood chemistry: Secondary | ICD-10-CM | POA: Diagnosis present

## 2023-08-11 DIAGNOSIS — N179 Acute kidney failure, unspecified: Secondary | ICD-10-CM | POA: Diagnosis not present

## 2023-08-11 DIAGNOSIS — Z902 Acquired absence of lung [part of]: Secondary | ICD-10-CM

## 2023-08-11 DIAGNOSIS — Z9981 Dependence on supplemental oxygen: Secondary | ICD-10-CM

## 2023-08-11 DIAGNOSIS — E785 Hyperlipidemia, unspecified: Secondary | ICD-10-CM | POA: Diagnosis present

## 2023-08-11 DIAGNOSIS — Z79899 Other long term (current) drug therapy: Secondary | ICD-10-CM

## 2023-08-11 DIAGNOSIS — I4891 Unspecified atrial fibrillation: Secondary | ICD-10-CM | POA: Diagnosis present

## 2023-08-11 DIAGNOSIS — R899 Unspecified abnormal finding in specimens from other organs, systems and tissues: Principal | ICD-10-CM

## 2023-08-11 DIAGNOSIS — I2510 Atherosclerotic heart disease of native coronary artery without angina pectoris: Secondary | ICD-10-CM | POA: Diagnosis present

## 2023-08-11 DIAGNOSIS — Z7901 Long term (current) use of anticoagulants: Secondary | ICD-10-CM

## 2023-08-11 DIAGNOSIS — F32A Depression, unspecified: Secondary | ICD-10-CM | POA: Diagnosis present

## 2023-08-11 LAB — CBC
HCT: 34.7 % — ABNORMAL LOW (ref 36.0–46.0)
Hemoglobin: 11.1 g/dL — ABNORMAL LOW (ref 12.0–15.0)
MCH: 27.1 pg (ref 26.0–34.0)
MCHC: 32 g/dL (ref 30.0–36.0)
MCV: 84.8 fL (ref 80.0–100.0)
Platelets: 247 10*3/uL (ref 150–400)
RBC: 4.09 MIL/uL (ref 3.87–5.11)
RDW: 20.7 % — ABNORMAL HIGH (ref 11.5–15.5)
WBC: 8 10*3/uL (ref 4.0–10.5)
nRBC: 0 % (ref 0.0–0.2)

## 2023-08-11 LAB — TROPONIN I (HIGH SENSITIVITY)
Troponin I (High Sensitivity): 93 ng/L — ABNORMAL HIGH (ref <18)
Troponin I (High Sensitivity): 96 ng/L — ABNORMAL HIGH (ref <18)

## 2023-08-11 LAB — HEPATIC FUNCTION PANEL
ALT: 34 U/L (ref 0–44)
AST: 46 U/L — ABNORMAL HIGH (ref 15–41)
Albumin: 3.5 g/dL (ref 3.5–5.0)
Alkaline Phosphatase: 73 U/L (ref 38–126)
Bilirubin, Direct: <0.1 mg/dL (ref 0.0–0.2)
Total Bilirubin: 0.5 mg/dL (ref 0.0–1.2)
Total Protein: 7.4 g/dL (ref 6.5–8.1)

## 2023-08-11 LAB — BASIC METABOLIC PANEL WITH GFR
Anion gap: 16 — ABNORMAL HIGH (ref 5–15)
Anion gap: 15 (ref 5–15)
BUN: 49 mg/dL — ABNORMAL HIGH (ref 8–23)
BUN: 45 mg/dL — ABNORMAL HIGH (ref 8–23)
CO2: 44 mmol/L — ABNORMAL HIGH (ref 22–32)
CO2: 40 mmol/L — ABNORMAL HIGH (ref 22–32)
Calcium: 9.7 mg/dL (ref 8.9–10.3)
Calcium: 9.2 mg/dL (ref 8.9–10.3)
Chloride: 74 mmol/L — ABNORMAL LOW (ref 98–111)
Chloride: 78 mmol/L — ABNORMAL LOW (ref 98–111)
Creatinine, Ser: 2.42 mg/dL — ABNORMAL HIGH (ref 0.44–1.00)
Creatinine, Ser: 2.43 mg/dL — ABNORMAL HIGH (ref 0.44–1.00)
GFR, Estimated: 21 mL/min — ABNORMAL LOW (ref >60)
GFR, Estimated: 21 mL/min — ABNORMAL LOW (ref >60)
Glucose, Bld: 119 mg/dL — ABNORMAL HIGH (ref 70–99)
Glucose, Bld: 184 mg/dL — ABNORMAL HIGH (ref 70–99)
Potassium: <2 mmol/L — CL (ref 3.5–5.1)
Potassium: 2.9 mmol/L — ABNORMAL LOW (ref 3.5–5.1)
Sodium: 134 mmol/L — ABNORMAL LOW (ref 135–145)
Sodium: 133 mmol/L — ABNORMAL LOW (ref 135–145)

## 2023-08-11 LAB — PROTIME-INR
INR: 1.4 — ABNORMAL HIGH (ref 0.8–1.2)
Prothrombin Time: 17.3 s — ABNORMAL HIGH (ref 11.4–15.2)

## 2023-08-11 LAB — MAGNESIUM: Magnesium: 2.4 mg/dL (ref 1.7–2.4)

## 2023-08-11 LAB — TSH: TSH: 1.59 u[IU]/mL (ref 0.350–4.500)

## 2023-08-11 MED ORDER — SODIUM CHLORIDE 0.9 % IV SOLN: INTRAVENOUS | Status: AC

## 2023-08-11 MED ORDER — FOSFOMYCIN TROMETHAMINE 3 G PO PACK: 3.0000 g | PACK | Freq: Once | ORAL | Status: DC

## 2023-08-11 MED ORDER — FERROUS SULFATE 325 (65 FE) MG PO TABS
325.0000 mg | ORAL_TABLET | Freq: Every day | ORAL | Status: DC
  Administered 2023-08-12 – 2023-08-14 (×3): 325 mg via ORAL
  Filled 2023-08-11 (×3): qty 1

## 2023-08-11 MED ORDER — POTASSIUM CHLORIDE CRYS ER 20 MEQ PO TBCR
40.0000 meq | EXTENDED_RELEASE_TABLET | ORAL | Status: AC
  Administered 2023-08-11: 40 meq via ORAL
  Filled 2023-08-11: qty 2

## 2023-08-11 MED ORDER — BUSPIRONE HCL 5 MG PO TABS
15.0000 mg | ORAL_TABLET | Freq: Two times a day (BID) | ORAL | Status: DC
Start: 2023-08-11 — End: 2023-08-14
  Administered 2023-08-11 – 2023-08-14 (×6): 15 mg via ORAL
  Filled 2023-08-11 (×6): qty 3

## 2023-08-11 MED ORDER — ROSUVASTATIN CALCIUM 20 MG PO TABS
40.0000 mg | ORAL_TABLET | Freq: Every day | ORAL | Status: DC
  Administered 2023-08-12 – 2023-08-14 (×3): 40 mg via ORAL
  Filled 2023-08-11 (×3): qty 2

## 2023-08-11 MED ORDER — ACETAMINOPHEN 650 MG RE SUPP: 650.0000 mg | Freq: Four times a day (QID) | RECTAL | Status: DC | PRN

## 2023-08-11 MED ORDER — ENSURE ENLIVE PO LIQD
237.0000 mL | Freq: Two times a day (BID) | ORAL | Status: DC
  Administered 2023-08-12 – 2023-08-14 (×5): 237 mL via ORAL

## 2023-08-11 MED ORDER — OXYCODONE HCL 5 MG PO TABS: 5.0000 mg | ORAL_TABLET | Freq: Four times a day (QID) | ORAL | Status: DC | PRN

## 2023-08-11 MED ORDER — NICOTINE 21 MG/24HR TD PT24
21.0000 mg | MEDICATED_PATCH | Freq: Every day | TRANSDERMAL | Status: DC
Start: 2023-08-11 — End: 2023-08-14
  Administered 2023-08-11 – 2023-08-14 (×4): 21 mg via TRANSDERMAL
  Filled 2023-08-11 (×4): qty 1

## 2023-08-11 MED ORDER — ACETAMINOPHEN 325 MG PO TABS: 650.0000 mg | ORAL_TABLET | Freq: Four times a day (QID) | ORAL | Status: DC | PRN

## 2023-08-11 MED ORDER — APIXABAN 5 MG PO TABS
5.0000 mg | ORAL_TABLET | Freq: Two times a day (BID) | ORAL | Status: DC
  Administered 2023-08-11 – 2023-08-14 (×7): 5 mg via ORAL
  Filled 2023-08-11 (×7): qty 1

## 2023-08-11 MED ORDER — VENLAFAXINE HCL ER 75 MG PO CP24
150.0000 mg | ORAL_CAPSULE | Freq: Every day | ORAL | Status: DC
  Administered 2023-08-12 – 2023-08-14 (×3): 150 mg via ORAL
  Filled 2023-08-11 (×3): qty 2

## 2023-08-11 MED ORDER — ISOSORBIDE DINITRATE 10 MG PO TABS
10.0000 mg | ORAL_TABLET | Freq: Three times a day (TID) | ORAL | Status: DC
  Administered 2023-08-11 – 2023-08-13 (×5): 10 mg via ORAL
  Filled 2023-08-11 (×5): qty 1

## 2023-08-11 MED ORDER — IPRATROPIUM-ALBUTEROL 0.5-2.5 (3) MG/3ML IN SOLN: 3.0000 mL | RESPIRATORY_TRACT | Status: DC | PRN

## 2023-08-11 MED ORDER — TRIMETHOBENZAMIDE HCL 100 MG/ML IM SOLN: 200.0000 mg | Freq: Four times a day (QID) | INTRAMUSCULAR | Status: DC | PRN

## 2023-08-11 MED ORDER — GABAPENTIN 300 MG PO CAPS
300.0000 mg | ORAL_CAPSULE | Freq: Two times a day (BID) | ORAL | Status: DC
  Administered 2023-08-11 – 2023-08-14 (×6): 300 mg via ORAL
  Filled 2023-08-11 (×6): qty 1

## 2023-08-11 MED ORDER — SODIUM CHLORIDE 0.9% FLUSH
3.0000 mL | Freq: Two times a day (BID) | INTRAVENOUS | Status: DC
  Administered 2023-08-11 – 2023-08-13 (×5): 3 mL via INTRAVENOUS

## 2023-08-11 MED ORDER — HYDRALAZINE HCL 25 MG PO TABS
25.0000 mg | ORAL_TABLET | Freq: Three times a day (TID) | ORAL | Status: DC
  Administered 2023-08-11 – 2023-08-12 (×2): 25 mg via ORAL
  Filled 2023-08-11 (×2): qty 1

## 2023-08-11 MED ORDER — AMIODARONE HCL 200 MG PO TABS
200.0000 mg | ORAL_TABLET | Freq: Every day | ORAL | Status: DC
  Administered 2023-08-12 – 2023-08-14 (×3): 200 mg via ORAL
  Filled 2023-08-11 (×3): qty 1

## 2023-08-11 MED ORDER — POTASSIUM CHLORIDE 20 MEQ PO PACK
40.0000 meq | PACK | Freq: Once | ORAL | Status: AC
Start: 2023-08-11 — End: 2023-08-11
  Administered 2023-08-11: 40 meq via ORAL
  Filled 2023-08-11: qty 2

## 2023-08-11 MED ORDER — POTASSIUM CHLORIDE 10 MEQ/100ML IV SOLN
10.0000 meq | INTRAVENOUS | Status: AC
  Administered 2023-08-11 (×3): 10 meq via INTRAVENOUS
  Filled 2023-08-11 (×3): qty 100

## 2023-08-11 NOTE — ED Triage Notes (Signed)
 Pt reports that Dr. Mildred All called her at 3am and told her she needed to go to the hospital b/c her potassium was critically low, "2 I think."  Pt states she went to her PCP as a regularly schedule appointment.  No medical complaints at this time.  Pt is on 3L home O2, states her breathing has been "a little slow." She continues to smoke.

## 2023-08-11 NOTE — H&P (Addendum)
 History and Physical    Patient: Maria Arroyo FMW:[id number] DOB: October 27, 1950 DOA: 08/11/2023 DOS: the patient was seen and examined on 08/11/2023 PCP: Aminta Lamar Hamilton, MD  Patient coming from: Home  Chief Complaint:  Chief Complaint  Patient presents with   abnormal labs   HPI: Maria Arroyo is a 73 y.o. 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  She has been experiencing muscle cramps and weakness for about a month. She reportes currently taking torsemide , a diuretic, with no recent changes in dosage, and is not on hydrochlorothiazide  or any potassium supplement. She has a history of low potassium levels, requiring supplementation during a previous hospital visit in February or March.  She reports nausea but no vomiting or diarrhea, despite an attempt to vomit. She has been experiencing some wheezing, which she indicates is not unusual for her. No stomach pain or discomfort.  She smokes about a pack of cigarettes per day.  In the emergency department patient was noted to be afebrile with blood pressures elevated up to 168/79, and all other vital signs maintained.  Labs significant for potassium less than 2, sodium 134, chloride 74, CO2 44, BUN 49, creatinine 2.42, anion gap 16, magnesium  2.4, AST 46, troponin 93, and hemoglobin 11.1.  Chest x-ray noted stable chronic emphysematous changes without acute abnormality.  Patient having given potassium chloride  30 meq IV and 40 meq p.o.  Review of Systems: As mentioned in the history of present illness. All other systems reviewed and are negative. 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 11/07/2018   COPD (chronic obstructive pulmonary disease) (HCC)    Coronary artery disease    Depression    Gout    R foot and knee   Hyperlipidemia    Hypertension    Left foot drop    Neuropathy    OA (osteoarthritis)    Pica    Tobacco abuse    Past Surgical History:  Procedure Laterality Date   ABDOMINAL HYSTERECTOMY     ANTERIOR CERVICAL DECOMP/DISCECTOMY FUSION  02/06/2011   Procedure: ANTERIOR CERVICAL DECOMPRESSION/DISCECTOMY FUSION 2 LEVELS;  Surgeon: Rockey LITTIE Peru;  Location: [facility] NEURO ORS;  Service: Neurosurgery;  Laterality: N/A;  Anterior Cervical Four-Five/Five-Six Decompression and Fusion with Plating and Bonegraft   BLADDER SURGERY     BONE BIOPSY  07/04/2023   Procedure: BIOPSY, GASTRIC;  Surgeon: Leigh Elspeth SQUIBB, MD;  Location: [facility] ENDOSCOPY;  Service: Gastroenterology;;   CARDIAC CATHETERIZATION  04/09/2009   EF 60%   CARDIAC CATHETERIZATION  03/24/2001   EF 55%   CARDIAC CATHETERIZATION  01/06/2001   EF 65%   CHOLECYSTECTOMY     COLONOSCOPY N/A 07/04/2023   Procedure: COLONOSCOPY;  Surgeon: Leigh Elspeth SQUIBB, MD;  Location: [facility] ENDOSCOPY;  Service: Gastroenterology;  Laterality: N/A;   CORONARY STENT PLACEMENT  03/2009   STENTING OF THE PROXIMAL TO MID RIGHT CORONARY   ESOPHAGOGASTRODUODENOSCOPY N/A 07/04/2023   Procedure: EGD (ESOPHAGOGASTRODUODENOSCOPY);  Surgeon: Leigh Elspeth SQUIBB, MD;  Location: [facility] ENDOSCOPY;  Service: Gastroenterology;  Laterality: N/A;   GIVENS CAPSULE STUDY N/A 07/04/2023   Procedure: IMAGING PROCEDURE, GI TRACT, INTRALUMINAL, VIA CAPSULE;  Surgeon: Leigh Elspeth SQUIBB, MD;  Location: [facility] ENDOSCOPY;  Service: Gastroenterology;  Laterality: N/A;   IR THORACENTESIS ASP PLEURAL SPACE W/IMG GUIDE  06/01/2018   LEFT HEART CATHETERIZATION WITH CORONARY ANGIOGRAM N/A 12/21/2011   Procedure: LEFT HEART CATHETERIZATION WITH CORONARY ANGIOGRAM;  Surgeon: Peter M Swaziland, MD;   Location: [facility] CATH LAB;  Service: Cardiovascular;  Laterality: N/A;   LOBECTOMY     NODE DISSECTION N/A 04/04/2018   Procedure: NODE DISSECTION;  Surgeon: Kerrin Elspeth BROCKS, MD;  Location: [facility] OR;  Service: Thoracic;  Laterality: N/A;   OTHER SURGICAL HISTORY  12/2014   R foot cyst removal   VIDEO ASSISTED THORACOSCOPY (VATS)/ LOBECTOMY Right 04/04/2018   Procedure: VIDEO ASSISTED THORACOSCOPY (VATS)/RIGHT LOWER LOBECTOMY;  Surgeon: Kerrin Elspeth BROCKS, MD;  Location: [facility] OR;  Service: Thoracic;  Laterality: Right;   Social History:  reports that she has been smoking cigarettes. She has a 70 pack-year smoking history. She has never used smokeless tobacco. She reports that she does not drink alcohol and does not use drugs.  Allergies  Allergen Reactions   Ace Inhibitors Other (See Comments)    Worsening renal function    Codeine Nausea Only    Family History  Problem Relation Age of Onset   Aneurysm Mother    Heart attack Father    Heart failure Father    Stroke Sister    Heart attack Brother     Prior to Admission medications   Medication Sig Start Date End Date Taking? Authorizing Provider  albuterol  (VENTOLIN  HFA) 108 (90 Base) MCG/ACT inhaler Inhale 2 puffs into the lungs every 6 (six) hours as needed for shortness of breath or wheezing. 02/17/23  Yes [provider]  amiodarone  (PACERONE ) 200 MG tablet Take 1 tablet (200 mg total) by mouth daily. 12/09/22  Yes Duke, Jon Garre, PA  apixaban  (ELIQUIS ) 5 MG TABS tablet Take 1 tablet (5 mg total) by mouth 2 (two) times daily. 12/09/22  Yes Duke, Jon Garre, PA  Ascorbic Acid (VITAMIN C PO) Take 1 tablet by mouth daily.   Yes [provider]  busPIRone  (BUSPAR ) 15 MG tablet Take 15 mg by mouth 2 (two) times daily.   Yes [provider]  Cholecalciferol  (VITAMIN D) 2000 UNITS tablet Take 2,000 Units by mouth daily.   Yes [provider]  gabapentin  (NEURONTIN ) 300 MG capsule Take 1 capsule  (300 mg total) by mouth 2 (two) times daily. 05/16/23  Yes Sebastian Toribio GAILS, MD  hydrALAZINE  (APRESOLINE ) 25 MG tablet Take 1 tablet (25 mg total) by mouth every 8 (eight) hours. 05/16/23  Yes Sebastian Toribio GAILS, MD  hydrochlorothiazide  (HYDRODIURIL ) 12.5 MG tablet Take 12.5 mg by mouth daily.   Yes [provider]  Iron , Ferrous Sulfate , 325 (65 Fe) MG TABS Take 1 tablet by mouth daily. 07/06/23  Yes Francella Rogue, MD  isosorbide  dinitrate (ISORDIL ) 10 MG tablet Take 1 tablet (10 mg total) by mouth 3 (three) times daily. 05/16/23  Yes Sebastian Toribio GAILS, MD  loratadine  (CLARITIN ) 10 MG tablet Take 1 tablet (10 mg total) by mouth daily. 05/17/23  Yes Sebastian Toribio GAILS, MD  Multiple Vitamin (MULTIVITAMIN WITH MINERALS) TABS tablet Take 1 tablet by mouth daily. 08/30/18  Yes Briana Avena D, MD  nitroGLYCERIN  (NITROSTAT ) 0.4 MG SL tablet PLACE 1 TABLET UNDER THE TONGUE EVERY 5 MINUTES AS NEEDED CHEST PAIN. Patient taking differently: Place 0.4 mg under the tongue every 5 (five) minutes as needed for chest pain. 05/25/22  Yes  [place], Peter M, MD  rosuvastatin  (CRESTOR ) 40 MG tablet TAKE 1 TABLET BY MOUTH DAILY Patient taking differently: Take 40 mg by mouth daily. TAKE 1 TABLET BY MOUTH DAILY 12/09/22  Yes Duke, Jon Garre, PA  torsemide  (DEMADEX ) 20 MG tablet Take 1 tablet (20 mg total) by mouth daily. 12/09/22  Yes Duke, Jon Garre, PA  venlafaxine  XR (EFFEXOR -XR) 150 MG 24 hr capsule Take 150 mg by mouth daily.   Yes [provider]  allopurinol  (ZYLOPRIM ) 100 MG tablet Take 100 mg by mouth daily.  Patient not taking: Reported on 08/11/2023    [provider]  bisoprolol  (ZEBETA ) 5 MG tablet Take 0.5 tablets (2.5 mg total) by mouth daily. Patient not taking: Reported on 08/11/2023 12/09/22   Madie Jon Garre, PA  oxyCODONE  (OXY IR/ROXICODONE ) 5 MG immediate release tablet Take 5 mg by mouth every 6 (six) hours as needed for moderate pain (pain score 4-6) or severe pain (pain  score 7-10). 08/10/23 08/15/23  [provider]    Physical Exam: Vitals:   08/11/23 0628 08/11/23 0830 08/11/23 1000  BP: (!) 168/79  (!) 155/75  Pulse: 73 66 74  Resp: 20 (!) 21 17  Temp: 98.2 F (36.8 C)    SpO2: 99% 100% 100%    Constitutional: Elderly female currently in NAD, calm, comfortable Eyes: PERRL, lids and conjunctivae normal ENMT: Mucous membranes are moist. Posterior pharynx clear of any exudate or lesions.Normal dentition.  Neck: normal, supple Respiratory:  Mild expiratory wheezes appreciated.  O2 saturations currently maintained on 3 L. Cardiovascular: Regular rate and rhythm, no murmurs / rubs / gallops. No extremity edema. 2+ pedal pulses. No carotid bruits.  Abdomen: no tenderness, no masses palpated.   Bowel sounds positive.  Musculoskeletal: no clubbing / cyanosis. No joint deformity upper and lower extremities. Good ROM, no contractures. Normal muscle tone.  Skin: no rashes, lesions, ulcers. No induration Neurologic: CN 2-12 grossly intact.   Strength 5/5 in all 4.  Psychiatric: Normal judgment and insight. Alert and oriented x 3. Normal mood.   Data Reviewed:  Abdomen EKG revealed a EKG reveals sinus rhythm at 72 bpm with prolonged PR interval and QT prolonged at 602.  Assessment and Plan:  Hypokalemia Acute.  Patient reports month-long history of muscle cramps and weakness.  Presented with potassium less than 2.  She had reported still using torsemide  although not recently filled.  Most recently filled appears to be hydrochlorothiazide  which she initially reported that she was not using.  She was given a total of 70 mEq of potassium chloride  in the ED.  - Holding diuretics - Give potassium chloride  40 meq p.o.x 2 dose - Continue to monitor and replace as needed - Physical therapy to eval and treatment in a.m.  Chronic respiratory failure with hypoxia COPD Patient noted to have expiratory wheezes present on physical exam. O2 saturations  currently maintained on home 3 L of oxygen.   - DuoNeb breathing treatments as needed  Suspected acute kidney injury superimposed on chronic kidney disease stage IIIb vs. chronic kidney disease stage IV On admission creatinine noted to be elevated up to 2.42 with BUN 49.  Records note creatinine had been 1.58 when checked on 4/8. - Bladder scan - Check urinalysis - Hold diuretics - Give 500 mL of normal saline IV fluid - Recheck kidney function in a.m.  Elevated Troponin Acute on chronic.  High-sensitivity troponin noted to be 93-96.  Patient did not report any complaints of chest pain. -  Continue to monitor  Prolonged QT interval Acute.  QTc noted to be 602 -Avoid QT prolonging medications  Normocytic anemia Hemoglobin noted to be 11.1 which appears improved from 10 on 4/17. - Continue to monitor  Paroxysmal atrial fibrillation Patient appears to be in a sinus rhythm - Continue amiodarone  and Eliquis   Essential hypertension Blood pressures noted to be elevated up to 168/79. 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  Anxiety and depression - Continue BuSpar  and venlafaxine   Hyperlipidemia - Continue Crestor   Tobacco abuse Patient reports still smoking a pack and half cigarettes per day on average. - Nicotine  patch offered   DVT prophylaxis: Eliquis  Advance Care Planning:   Code Status: Full Code    Consults: none  Family Communication: family updated at bedside   Severity of Illness: The appropriate patient status for this patient is OBSERVATION. Observation status is judged to be reasonable and necessary in order to provide the required intensity of service to ensure the patient's safety. The patient's presenting symptoms, physical exam findings, and initial radiographic and laboratory data in the context of their medical condition is  felt to place them at decreased risk for further clinical deterioration. Furthermore, it is anticipated that the patient will be medically stable for discharge from the hospital within 2 midnights of admission.   Author: Maximino DELENA Sharps, MD 08/11/2023 10:29 AM  For on call review [email or web address].

## 2023-08-11 NOTE — ED Notes (Signed)
 Pt o2 was attached to the tank on the back of the bed; tank was empty Pt placed on main o2 and increased to 6L until vitals returned to normal

## 2023-08-11 NOTE — ED Provider Notes (Signed)
 [place] [facility] AT [facility] Provider Note   CSN: [id number] Arrival date & time: 08/11/23  5621     History COPD, HTN, CAD Chief Complaint  Patient presents with   abnormal labs    Maria Arroyo is a 73 y.o. female.  73 y.o female with a PMH of CAD,COPD, HTN presents to the ED with a chief complaint of abnormal labs. She had labs drawn at her PCP office, called today for hypokalemia around 3am. Patient reports feeling weak along with having tremors like when she was hospitalized back in February. In addition, she describes muscle weakness ongoing for the past several months. She continues to endorse daily tobacco use of about a pack and a half of cigarettes a day. No cough, no fever, or swelling or leg swelling.   The history is provided by the patient.       Home Medications Prior to Admission medications   Medication Sig Start Date End Date Taking? Authorizing Provider  albuterol  (VENTOLIN  HFA) 108 (90 Base) MCG/ACT inhaler Inhale 2 puffs into the lungs every 6 (six) hours as needed for shortness of breath or wheezing. 02/17/23  Yes [provider]  amiodarone  (PACERONE ) 200 MG tablet Take 1 tablet (200 mg total) by mouth daily. 12/09/22  Yes Duke, Warren Haber, PA  apixaban  (ELIQUIS ) 5 MG TABS tablet Take 1 tablet (5 mg total) by mouth 2 (two) times daily. 12/09/22  Yes Duke, Warren Haber, PA  Ascorbic Acid (VITAMIN C PO) Take 1 tablet by mouth daily.   Yes [provider]  busPIRone  (BUSPAR ) 15 MG tablet Take 15 mg by mouth 2 (two) times daily.   Yes [provider]  Cholecalciferol  (VITAMIN D) 2000 UNITS tablet Take 2,000 Units by mouth daily.   Yes [provider]  gabapentin  (NEURONTIN ) 300 MG capsule Take 1 capsule (300 mg total) by mouth 2 (two) times daily. 05/16/23  Yes Armenta Landau, MD  hydrALAZINE  (APRESOLINE ) 25 MG tablet Take 1 tablet (25 mg total) by mouth every 8 (eight) hours.  05/16/23  Yes Armenta Landau, MD  hydrochlorothiazide  (HYDRODIURIL ) 12.5 MG tablet Take 12.5 mg by mouth daily.   Yes [provider]  Iron , Ferrous Sulfate , 325 (65 Fe) MG TABS Take 1 tablet by mouth daily. 07/06/23  Yes Jayson Michael, MD  isosorbide  dinitrate (ISORDIL ) 10 MG tablet Take 1 tablet (10 mg total) by mouth 3 (three) times daily. 05/16/23  Yes Armenta Landau, MD  loratadine  (CLARITIN ) 10 MG tablet Take 1 tablet (10 mg total) by mouth daily. 05/17/23  Yes Armenta Landau, MD  Multiple Vitamin (MULTIVITAMIN WITH MINERALS) TABS tablet Take 1 tablet by mouth daily. 08/30/18  Yes Loree Roe D, MD  nitroGLYCERIN  (NITROSTAT ) 0.4 MG SL tablet PLACE 1 TABLET UNDER THE TONGUE EVERY 5 MINUTES AS NEEDED CHEST PAIN. Patient taking differently: Place 0.4 mg under the tongue every 5 (five) minutes as needed for chest pain. 05/25/22  Yes [place], Peter M, MD  rosuvastatin  (CRESTOR ) 40 MG tablet TAKE 1 TABLET BY MOUTH DAILY Patient taking differently: Take 40 mg by mouth daily. TAKE 1 TABLET BY MOUTH DAILY 12/09/22  Yes Duke, Warren Haber, PA  torsemide  (DEMADEX ) 20 MG tablet Take 1 tablet (20 mg total) by mouth daily. 12/09/22  Yes Duke, Warren Haber, PA  venlafaxine  XR (EFFEXOR -XR) 150 MG 24 hr capsule Take 150 mg by mouth daily.   Yes [provider]  allopurinol  (ZYLOPRIM ) 100 MG tablet Take 100  mg by mouth daily.  Patient not taking: Reported on 08/11/2023    [provider]  bisoprolol  (ZEBETA ) 5 MG tablet Take 0.5 tablets (2.5 mg total) by mouth daily. Patient not taking: Reported on 08/11/2023 12/09/22   Lamond Pilot, PA  oxyCODONE  (OXY IR/ROXICODONE ) 5 MG immediate release tablet Take 5 mg by mouth every 6 (six) hours as needed for moderate pain (pain score 4-6) or severe pain (pain score 7-10). 08/10/23 08/15/23  [provider]      Allergies    Ace inhibitors and Codeine    Review of Systems   Review of Systems  Constitutional:  Negative for  chills and fever.  HENT:  Negative for sore throat.   Respiratory:  Negative for shortness of breath.   Cardiovascular:  Negative for chest pain and leg swelling.  Gastrointestinal:  Negative for abdominal pain, nausea and vomiting.  Genitourinary:  Negative for flank pain.  Musculoskeletal:  Positive for myalgias. Negative for back pain.  All other systems reviewed and are negative.   Physical Exam Updated Vital Signs BP (!) 155/75 (BP Location: Right Arm)   Pulse 74   Temp 98.2 F (36.8 C)   Resp 17   SpO2 100%  Physical Exam Vitals and nursing note reviewed.  Constitutional:      Appearance: Normal appearance. She is not ill-appearing or diaphoretic.  HENT:     Head: Normocephalic and atraumatic.     Nose: Nose normal.     Mouth/Throat:     Mouth: Mucous membranes are dry.  Eyes:     Pupils: Pupils are equal, round, and reactive to light.  Cardiovascular:     Rate and Rhythm: Normal rate.  Pulmonary:     Effort: Pulmonary effort is normal.     Breath sounds: Wheezing present.  Abdominal:     General: Abdomen is flat.     Palpations: Abdomen is soft.     Tenderness: There is no abdominal tenderness.  Musculoskeletal:     Cervical back: Normal range of motion and neck supple.     Right lower leg: No edema.     Left lower leg: No edema.  Skin:    General: Skin is warm and dry.  Neurological:     Mental Status: She is alert and oriented to person, place, and time.     ED Results / Procedures / Treatments   Labs (all labs ordered are listed, but only abnormal results are displayed) Labs Reviewed  BASIC METABOLIC PANEL WITH GFR - Abnormal; Notable for the following components:      Result Value   Sodium 134 (*)    Potassium <2.0 (*)    Chloride 74 (*)    CO2 44 (*)    Glucose, Bld 119 (*)    BUN 49 (*)    Creatinine, Ser 2.42 (*)    GFR, Estimated 21 (*)    Anion gap 16 (*)    All other components within normal limits  CBC - Abnormal; Notable for the  following components:   Hemoglobin 11.1 (*)    HCT 34.7 (*)    RDW 20.7 (*)    All other components within normal limits  HEPATIC FUNCTION PANEL - Abnormal; Notable for the following components:   AST 46 (*)    All other components within normal limits  PROTIME-INR - Abnormal; Notable for the following components:   Prothrombin Time 17.3 (*)    INR 1.4 (*)    All other  components within normal limits  TROPONIN I (HIGH SENSITIVITY) - Abnormal; Notable for the following components:   Troponin I (High Sensitivity) 93 (*)    All other components within normal limits  MAGNESIUM   URINALYSIS, ROUTINE W REFLEX MICROSCOPIC  I-STAT CHEM 8, ED  TROPONIN I (HIGH SENSITIVITY)    EKG EKG Interpretation Date/Time:  Wednesday Aug 11 2023 07:02:51 EDT Ventricular Rate:  71 PR Interval:  236 QRS Duration:  197 QT Interval:  553 QTC Calculation: 602 R Axis:   -76  Text Interpretation: Sinus rhythm Prolonged PR interval Left atrial enlargement Left bundle branch block Negative sgarbossas, no STEMI Subtle U wave seen in inferior leads indicating likely hypokalemia Confirmed by Rosealee Concha ([id number]) on 08/11/2023 8:48:52 AM  Radiology DG Chest Portable 1 View Result Date: 08/11/2023 CLINICAL DATA:  73 year old female with wheezing.  COPD, emphysema. EXAM: PORTABLE CHEST 1 VIEW COMPARISON:  Chest radiographs 07/15/2023 and earlier. FINDINGS: Portable AP upright view at 0705 hours. Chronic emphysema with right basilar scarring and chronic right pleural effusion which do not appear significantly changed from a 2023 CT. Stable lung volumes and mediastinal contours. Stable trachea. No pneumothorax, pulmonary edema or acute lung opacity. Partially visible chronic ACDF. Stable visualized osseous structures. Negative visible bowel gas. IMPRESSION: 1. Stable chronic Emphysema (ICD10-J43.9) and right basilar scarring/pleural effusion. 2. No acute cardiopulmonary abnormality. Electronically Signed   By: Marlise Simpers M.D.    On: 08/11/2023 07:26    Procedures .Critical Care  Performed by: Valory Wetherby, PA-C Authorized by: Ladashia Demarinis, PA-C   Critical care provider statement:    Critical care time (minutes):  45   Critical care start time:  08/11/2023 7:00 AM   Critical care end time:  08/11/2023 7:45 AM   Critical care was necessary to treat or prevent imminent or life-threatening deterioration of the following conditions:  Metabolic crisis   Critical care was time spent personally by me on the following activities:  Discussions with consultants and examination of patient   Care discussed with: admitting provider       Medications Ordered in ED Medications  potassium chloride  10 mEq in 100 mL IVPB (10 mEq Intravenous New Bag/Given 08/11/23 0849)  potassium chloride  (KLOR-CON ) packet 40 mEq (40 mEq Oral Given 08/11/23 0847)    ED Course/ Medical Decision Making/ A&P Clinical Course as of 08/11/23 1003  Wed Aug 11, 2023  0842 Potassium(!!): <2.0 [JS]  0901 Troponin I (High Sensitivity)(!): 93 [JS]    Clinical Course User Index [JS] Rina Adney, PA-C                                 Medical Decision Making Amount and/or Complexity of Data Reviewed Labs: ordered. Decision-making details documented in ED Course. Radiology: ordered.  Risk Prescription drug management. Decision regarding hospitalization.    This patient presents to the ED for concern of abnormal labs, this involves a number of treatment options, and is a complaint that carries with it a high risk of complications and morbidity.     Co morbidities: Discussed in HPI   Brief History:  See HPI.   EMR reviewed including pt PMHx, past surgical history and past visits to ER.   See HPI for more details   Lab Tests:  I ordered and independently interpreted labs.  The pertinent results include:    CBC with no leukocytosis, hemoglobin is decreased.  BMP remarkable for potassium of less than  2.0, creatinine levels elevated,  however does have a history of underlying chronic kidney disease.  Magnesium  is normal.  LFTs are within normal limits.Troponin is significantly elevated at 93, no chest pain present. PT INR at her baseline.    Imaging Studies:  Xray of the chest showed: IMPRESSION:  1. Stable chronic Emphysema (ICD10-J43.9) and right basilar  scarring/pleural effusion.    2. No acute cardiopulmonary abnormality.   Cardiac Monitoring:  The patient was maintained on a cardiac monitor.  I personally viewed and interpreted the cardiac monitored which showed an underlying rhythm of: new LBBB, some EKG changes noted discussed with cardiology.  EKG non-ischemic   Medicines ordered:  I ordered medication including potassium (IV & PO)  for replacement.  Reevaluation of the patient after these medicines showed that the patient stayed the same I have reviewed the patients home medicines and have made adjustments as needed   Critical Interventions:        Potassium replacement.   Consults:  I requested consultation with cardiology,  and discussed lab and imaging findings as well as pertinent plan - they recommend: aware of patient, will need to replace electrolytes and follow up.   Reevaluation:  After the interventions noted above I re-evaluated patient and found that they have :stayed the same   Social Determinants of Health:  The patient's social determinants of health were a factor in the care of this patient  Problem List / ED Course:  Patient presented to the ED after being called by her PCP at 3 AM due to abnormal labs.  Patient reports she had lab work done yesterday PCPs office, found to have a extremely low potassium.  Does have underlying history of tobacco use.  Anticoagulated on Eliquis , according to med review she is currently on torsemide  along with HCTZ.  No GI losses. Labs here remarkable for a potassium of less than 2.0, magnesium  is within normal limits.  EKG does show some  concerning changes and elevated troponin, therefore cardiology was called for further recommendations.  They recommend repletion along with reconsultation after electrolytes have been replaced.  Chest x-ray here with any type of infection, no cough, no fevers to suspect pneumonia.  Patient does wear oxygen at baseline, there has not been an increase in her O2 saturations. She was given IV, p.o. potassium to help with replacement.  Will call hospitalist for further admission. Spoke to Dr. Felipe Horton, who will admit patient for further management.  Dispostion:  After consideration of the diagnostic results and the patients response to treatment, I feel that the patent would benefit from admission for further management of hypokalemia.    Portions of this note were generated with Scientist, clinical (histocompatibility and immunogenetics). Dictation errors may occur despite best attempts at proofreading.   Final Clinical Impression(s) / ED Diagnoses Final diagnoses:  Abnormal laboratory test result  Hypokalemia    Rx / DC Orders ED Discharge Orders     None         Coner Gibbard, PA-C 08/11/23 1003    Horton, Vonzella Guernsey, MD 08/12/23 [phone]

## 2023-08-12 DIAGNOSIS — M21372 Foot drop, left foot: Secondary | ICD-10-CM | POA: Diagnosis present

## 2023-08-12 DIAGNOSIS — G894 Chronic pain syndrome: Secondary | ICD-10-CM | POA: Diagnosis present

## 2023-08-12 DIAGNOSIS — E1122 Type 2 diabetes mellitus with diabetic chronic kidney disease: Secondary | ICD-10-CM | POA: Diagnosis present

## 2023-08-12 DIAGNOSIS — Z7901 Long term (current) use of anticoagulants: Secondary | ICD-10-CM | POA: Diagnosis not present

## 2023-08-12 DIAGNOSIS — E876 Hypokalemia: Secondary | ICD-10-CM | POA: Diagnosis present

## 2023-08-12 DIAGNOSIS — I447 Left bundle-branch block, unspecified: Secondary | ICD-10-CM | POA: Diagnosis present

## 2023-08-12 DIAGNOSIS — I480 Paroxysmal atrial fibrillation: Secondary | ICD-10-CM | POA: Diagnosis present

## 2023-08-12 DIAGNOSIS — Z8249 Family history of ischemic heart disease and other diseases of the circulatory system: Secondary | ICD-10-CM | POA: Diagnosis not present

## 2023-08-12 DIAGNOSIS — N179 Acute kidney failure, unspecified: Secondary | ICD-10-CM | POA: Diagnosis present

## 2023-08-12 DIAGNOSIS — N184 Chronic kidney disease, stage 4 (severe): Secondary | ICD-10-CM | POA: Diagnosis present

## 2023-08-12 DIAGNOSIS — J9611 Chronic respiratory failure with hypoxia: Secondary | ICD-10-CM | POA: Diagnosis present

## 2023-08-12 DIAGNOSIS — I130 Hypertensive heart and chronic kidney disease with heart failure and stage 1 through stage 4 chronic kidney disease, or unspecified chronic kidney disease: Secondary | ICD-10-CM | POA: Diagnosis present

## 2023-08-12 DIAGNOSIS — Z902 Acquired absence of lung [part of]: Secondary | ICD-10-CM | POA: Diagnosis not present

## 2023-08-12 DIAGNOSIS — D631 Anemia in chronic kidney disease: Secondary | ICD-10-CM | POA: Diagnosis present

## 2023-08-12 DIAGNOSIS — Z955 Presence of coronary angioplasty implant and graft: Secondary | ICD-10-CM | POA: Diagnosis not present

## 2023-08-12 DIAGNOSIS — Z9981 Dependence on supplemental oxygen: Secondary | ICD-10-CM | POA: Diagnosis not present

## 2023-08-12 DIAGNOSIS — I2510 Atherosclerotic heart disease of native coronary artery without angina pectoris: Secondary | ICD-10-CM | POA: Diagnosis present

## 2023-08-12 DIAGNOSIS — Z79899 Other long term (current) drug therapy: Secondary | ICD-10-CM | POA: Diagnosis not present

## 2023-08-12 DIAGNOSIS — E66811 Obesity, class 1: Secondary | ICD-10-CM | POA: Diagnosis present

## 2023-08-12 DIAGNOSIS — Z888 Allergy status to other drugs, medicaments and biological substances status: Secondary | ICD-10-CM | POA: Diagnosis not present

## 2023-08-12 DIAGNOSIS — F17210 Nicotine dependence, cigarettes, uncomplicated: Secondary | ICD-10-CM | POA: Diagnosis present

## 2023-08-12 DIAGNOSIS — E785 Hyperlipidemia, unspecified: Secondary | ICD-10-CM | POA: Diagnosis present

## 2023-08-12 DIAGNOSIS — F32A Depression, unspecified: Secondary | ICD-10-CM | POA: Diagnosis present

## 2023-08-12 DIAGNOSIS — J449 Chronic obstructive pulmonary disease, unspecified: Secondary | ICD-10-CM | POA: Diagnosis present

## 2023-08-12 LAB — URINALYSIS, ROUTINE W REFLEX MICROSCOPIC
Bilirubin Urine: NEGATIVE
Glucose, UA: 50 mg/dL — AB
Ketones, ur: NEGATIVE mg/dL
Nitrite: NEGATIVE
Protein, ur: 30 mg/dL — AB
Specific Gravity, Urine: 1.008 (ref 1.005–1.030)
pH: 7 (ref 5.0–8.0)

## 2023-08-12 LAB — CBC
HCT: 30 % — ABNORMAL LOW (ref 36.0–46.0)
Hemoglobin: 9.3 g/dL — ABNORMAL LOW (ref 12.0–15.0)
MCH: 26.8 pg (ref 26.0–34.0)
MCHC: 31 g/dL (ref 30.0–36.0)
MCV: 86.5 fL (ref 80.0–100.0)
Platelets: 209 10*3/uL (ref 150–400)
RBC: 3.47 MIL/uL — ABNORMAL LOW (ref 3.87–5.11)
RDW: 20.8 % — ABNORMAL HIGH (ref 11.5–15.5)
WBC: 7.2 10*3/uL (ref 4.0–10.5)
nRBC: 0 % (ref 0.0–0.2)

## 2023-08-12 LAB — BASIC METABOLIC PANEL WITH GFR
Anion gap: 10 (ref 5–15)
BUN: 45 mg/dL — ABNORMAL HIGH (ref 8–23)
CO2: 39 mmol/L — ABNORMAL HIGH (ref 22–32)
Calcium: 8.8 mg/dL — ABNORMAL LOW (ref 8.9–10.3)
Chloride: 83 mmol/L — ABNORMAL LOW (ref 98–111)
Creatinine, Ser: 2.5 mg/dL — ABNORMAL HIGH (ref 0.44–1.00)
GFR, Estimated: 20 mL/min — ABNORMAL LOW (ref >60)
Glucose, Bld: 182 mg/dL — ABNORMAL HIGH (ref 70–99)
Potassium: 3.1 mmol/L — ABNORMAL LOW (ref 3.5–5.1)
Sodium: 132 mmol/L — ABNORMAL LOW (ref 135–145)

## 2023-08-12 MED ORDER — SODIUM CHLORIDE 0.9 % IV SOLN: INTRAVENOUS | Status: AC

## 2023-08-12 MED ORDER — LABETALOL HCL 5 MG/ML IV SOLN
10.0000 mg | INTRAVENOUS | Status: DC | PRN
Start: 2023-08-12 — End: 2023-08-14

## 2023-08-12 MED ORDER — HYDRALAZINE HCL 50 MG PO TABS
50.0000 mg | ORAL_TABLET | Freq: Three times a day (TID) | ORAL | Status: DC
  Administered 2023-08-12 – 2023-08-14 (×6): 50 mg via ORAL
  Filled 2023-08-12 (×6): qty 1

## 2023-08-12 MED ORDER — POTASSIUM CHLORIDE 20 MEQ PO PACK
40.0000 meq | PACK | ORAL | Status: AC
  Administered 2023-08-12 (×2): 40 meq via ORAL
  Filled 2023-08-12 (×2): qty 2

## 2023-08-12 NOTE — Evaluation (Signed)
 Physical Therapy Evaluation Patient Details Name: Maria Arroyo MRN: [id number] DOB: [birth year] Today's Date: 08/12/2023  History of Present Illness  73 y.o.  female was referred to the ED 5/14 by PCP for severe hypokalemia (<2).  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  Clinical Impression  Pt admitted with above diagnosis. Ambulates unassisted with rollator at home PTA. Husband works during the day, niece comes over to help pt with ADLs and iADLs. Denies any falls in the past 6 moths. Today she was able to ambulate 75 feet with supervision and RW for support today. Feels close to baseline. Unable to obtain pleth for Spo2 - mild dyspnea, on 3L supplemental O2 (baseline.) Pt currently with functional limitations due to the deficits listed below (see PT Problem List). Pt will benefit from acute skilled PT to increase their independence and safety with mobility to allow discharge.           If plan is discharge home, recommend the following: A little help with walking and/or transfers;A little help with bathing/dressing/bathroom;Assist for transportation;Help with stairs or ramp for entrance   Can travel by private vehicle        Equipment Recommendations None recommended by PT  Recommendations for Other Services       Functional Status Assessment Patient has had a recent decline in their functional status and demonstrates the ability to make significant improvements in function in a reasonable and predictable amount of time.     Precautions / Restrictions Precautions Precautions: Fall Recall of Precautions/Restrictions: Intact Restrictions Weight Bearing Restrictions Per Provider Order: No      Mobility  Bed Mobility Overal bed mobility: Modified Independent             General bed mobility comments: extra time no assist    Transfers Overall transfer level: Needs  assistance Equipment used: Rolling walker (2 wheels) Transfers: Sit to/from Stand Sit to Stand: Contact guard assist           General transfer comment: CGA for safety to rise from edge of bed, no physical assist. Extra time, effortful. Cues for hand placement.    Ambulation/Gait Ambulation/Gait assistance: Supervision Gait Distance (Feet): 75 Feet Assistive device: Rolling walker (2 wheels) Gait Pattern/deviations: Step-through pattern, Decreased stride length Gait velocity: dec Gait velocity interpretation: <1.31 ft/sec, indicative of household ambulator   General Gait Details: Grossly stable with RW for support. Slower and guarded without overt LOB noted. Minimal dyspnea. Unable to obtain pleth for SpO2. Supervision for safety, management of lines/leads  Stairs            Wheelchair Mobility     Tilt Bed    Modified Rankin (Stroke Patients Only)       Balance Overall balance assessment: Needs assistance Sitting-balance support: No upper extremity supported, Feet supported Sitting balance-Leahy Scale: Good     Standing balance support: Single extremity supported Standing balance-Leahy Scale: Poor                               Pertinent Vitals/Pain Pain Assessment Pain Assessment: No/denies pain    Home Living Family/patient expects to be discharged to:: Private residence Living Arrangements: Spouse/significant other Available Help at Discharge: Family;Available PRN/intermittently (Husband works, niece checks on pt during day) Type of Home: House Home Access: Stairs to enter (3 in back, 5 in front)  Entrance Stairs-Rails: Right Entrance Stairs-Number of Steps: 3   Home Layout: One level Home Equipment: Cane - single point;Grab bars - tub/shower;Shower seat;BSC/3in1;Rollator (4 wheels)      Prior Function Prior Level of Function : Independent/Modified Independent             Mobility Comments: Uses rollator, denies falls ADLs  Comments: niece assists with iADLs and ADLs as needed     Extremity/Trunk Assessment   Upper Extremity Assessment Upper Extremity Assessment: Defer to OT evaluation    Lower Extremity Assessment Lower Extremity Assessment: Generalized weakness       Communication   Communication Communication: No apparent difficulties    Cognition Arousal: Alert Behavior During Therapy: WFL for tasks assessed/performed   PT - Cognitive impairments: No family/caregiver present to determine baseline                         Following commands: Intact       Cueing Cueing Techniques: Verbal cues, Gestural cues     General Comments General comments (skin integrity, edema, etc.): Unable to obtain accurate pleth. Pt on 3L supplemental O2 at home and during session.    Exercises     Assessment/Plan    PT Assessment Patient needs continued PT services  PT Problem List Decreased strength;Decreased activity tolerance;Decreased balance;Decreased mobility;Cardiopulmonary status limiting activity       PT Treatment Interventions DME instruction;Gait training;Stair training;Functional mobility training;Therapeutic activities;Therapeutic exercise;Balance training;Neuromuscular re-education;Patient/family education    PT Goals (Current goals can be found in the Care Plan section)  Acute Rehab PT Goals Patient Stated Goal: Get well go home PT Goal Formulation: With patient Time For Goal Achievement: 08/26/23 Potential to Achieve Goals: Good    Frequency Min 2X/week     Co-evaluation               AM-PAC PT "6 Clicks" Mobility  Outcome Measure Help needed turning from your back to your side while in a flat bed without using bedrails?: None Help needed moving from lying on your back to sitting on the side of a flat bed without using bedrails?: A Little Help needed moving to and from a bed to a chair (including a wheelchair)?: A Little Help needed standing up from a chair  using your arms (e.g., wheelchair or bedside chair)?: A Little Help needed to walk in hospital room?: A Little Help needed climbing 3-5 steps with a railing? : A Little 6 Click Score: 19    End of Session Equipment Utilized During Treatment: Gait belt;Oxygen Activity Tolerance: Patient tolerated treatment well Patient left: in chair;with call bell/phone within reach;with chair alarm set   PT Visit Diagnosis: Other abnormalities of gait and mobility (R26.89);Unsteadiness on feet (R26.81);Muscle weakness (generalized) (M62.81)    Time: 4098-1191 PT Time Calculation (min) (ACUTE ONLY): 32 min   Charges:   PT Evaluation $PT Eval Low Complexity: 1 Low PT Treatments $Gait Training: 8-22 mins PT General Charges $$ ACUTE PT VISIT: 1 Visit         Jory Ng, PT, DPT [facility]  Rehabilitation Services Physical Therapist Office: [phone] Website: [email or web address]   Alinda Irani 08/12/2023, 10:51 AM

## 2023-08-12 NOTE — Progress Notes (Addendum)
 PROGRESS NOTE        PATIENT DETAILS Name: Maria Arroyo Age: 72 y.o. Sex: female Date of Birth: 19-Mar-1951 Admit Date: 08/11/2023 Admitting Physician Lena Qualia, MD [id number], Rella Cardinal, MD  Brief Summary: Patient is a 73 y.o.  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  Objective: Vitals: Blood pressure (!) 170/78, pulse 70, temperature 97.8 F (36.6 C), temperature source Axillary, resp. rate 12, SpO2 98%.   Exam: Gen Exam:Alert awake-not in any distress HEENT:atraumatic, normocephalic Chest: B/L clear to auscultation anteriorly CVS:S1S2 regular Abdomen:soft non tender, non distended Extremities:no edema Neurology: Non focal Skin: no rash  Pertinent Labs/Radiology:    Latest Ref Rng & Units 08/12/2023    4:55 AM 08/11/2023    6:57 AM 07/15/2023    8:13 AM  CBC  WBC 4.0 - 10.5 K/uL 7.2  8.0  8.6   Hemoglobin 12.0 - 15.0 g/dL 9.3  62.8  31.5   Hematocrit 36.0 - 46.0 % 30.0  34.7  34.0   Platelets 150 - 400 K/uL 209  247  273     Lab Results  Component Value Date   NA 132 (L) 08/12/2023   K 3.1 (L) 08/12/2023   CL 83 (L) 08/12/2023   CO2 39 (H) 08/12/2023      Assessment/Plan: Hypokalemia Thought to be due to use of Demadex /HCTZ K improving but still on the lower side-replete again today  AKI on CKD 4 AKI likely hemodynamically mediated due to diuretic use (baseline creatinine appears to be around<2) Check UA-renal ultrasound-frequent bladder scans to ensure no retention Begin IV fluid hydration-and recheck electrolytes  tomorrow Avoid nephrotoxic agents  Normocytic anemia Likely secondary to CKD Had a recent hospitalization for anemia-EGD/colonoscopy was negative for bleeding source.  Follow CBC periodically  Prolonged QTc interval Improving Secondary to hypokalemia Telemetry monitoring Continue to replete potassium Check magnesium  levels tomorrow morning  COPD on home oxygen 3 L Stable-not in exacerbation Bronchodilators  History of squamous cell carcinoma of the right lung-s/p right lobectomy 2020 Supportive care  HTN BP on the higher side Hydralazine  dosage to 50 mg 3 times daily Continue nitrates.  HLD Statin  Chronic HFpEF Euvolemic Diuretics on hold  PAF Sinus rhythm Telemetry monitoring Amiodarone /Eliquis   Mood disorder Stable BuSpar /Effexor   Chronic pain syndrome (back/hip/legs) As needed oxycodone   Tobacco abuse Counseled Transdermal nicotine   Class 1 Obesity: Estimated body mass index is 30.12 kg/m as calculated from the following:   Height as of 07/04/23: 5\' 1"  (1.549 m).   Weight as of 07/06/23: 72.3 kg.   Code status:   Code Status: Full Code   DVT Prophylaxis: apixaban  (ELIQUIS ) tablet 5 mg    Family Communication: None at bedside   Disposition Plan: Status is: Observation The patient will require care spanning > 2 midnights and should be moved to inpatient because: Severity of illness   Planned Discharge Destination:Home   Diet: Diet Order             Diet Heart Room  service appropriate? Yes; Fluid consistency: Thin  Diet effective now                     Antimicrobial agents: Anti-infectives (From admission, onward)    Start     Dose/Rate Route Frequency Ordered Stop   08/11/23 1100  fosfomycin (MONUROL) packet 3 g  Status:  Discontinued        3 g Oral  Once 08/11/23 1048 08/11/23 1048        MEDICATIONS: Scheduled Meds:  amiodarone   200 mg Oral Daily   apixaban   5 mg Oral BID   busPIRone   15 mg Oral BID   feeding  supplement  237 mL Oral BID BM   ferrous sulfate   325 mg Oral Q breakfast   gabapentin   300 mg Oral BID   hydrALAZINE   25 mg Oral Q8H   isosorbide  dinitrate  10 mg Oral TID   nicotine   21 mg Transdermal Daily   potassium chloride   40 mEq Oral Q4H   rosuvastatin   40 mg Oral Daily   sodium chloride  flush  3 mL Intravenous Q12H   venlafaxine  XR  150 mg Oral Q breakfast   Continuous Infusions:  sodium chloride  75 mL/hr at 08/12/23 0752   PRN Meds:.acetaminophen  **OR** acetaminophen , ipratropium-albuterol , oxyCODONE , trimethobenzamide   I have personally reviewed following labs and imaging studies  LABORATORY DATA: CBC: Recent Labs  Lab 08/11/23 0657 08/12/23 0455  WBC 8.0 7.2  HGB 11.1* 9.3*  HCT 34.7* 30.0*  MCV 84.8 86.5  PLT 247 209    Basic Metabolic Panel: Recent Labs  Lab 08/11/23 0655 08/11/23 0657 08/11/23 1455 08/12/23 0455  NA  --  134* 133* 132*  K  --  <2.0* 2.9* 3.1*  CL  --  74* 78* 83*  CO2  --  44* 40* 39*  GLUCOSE  --  119* 184* 182*  BUN  --  49* 45* 45*  CREATININE  --  2.42* 2.43* 2.50*  CALCIUM   --  9.7 9.2 8.8*  MG 2.4  --   --   --     GFR: CrCl cannot be calculated (Unknown ideal weight.).  Liver Function Tests: Recent Labs  Lab 08/11/23 0657  AST 46*  ALT 34  ALKPHOS 73  BILITOT 0.5  PROT 7.4  ALBUMIN 3.5   No results for input(s): "LIPASE", "AMYLASE" in the last 168 hours. No results for input(s): "AMMONIA" in the last 168 hours.  Coagulation Profile: Recent Labs  Lab 08/11/23 0655  INR 1.4*    Cardiac Enzymes: No results for input(s): "CKTOTAL", "CKMB", "CKMBINDEX", "TROPONINI" in the last 168 hours.  BNP (last 3 results) No results for input(s): "PROBNP" in the last 8760 hours.  Lipid Profile: No results for input(s): "CHOL", "HDL", "LDLCALC", "TRIG", "CHOLHDL", "LDLDIRECT" in the last 72 hours.  Thyroid Function Tests: Recent Labs    08/11/23 0923  TSH 1.590    Anemia Panel: No results for input(s):  "VITAMINB12", "FOLATE", "FERRITIN", "TIBC", "IRON ", "RETICCTPCT" in the last 72 hours.  Urine analysis:    Component Value Date/Time   COLORURINE YELLOW 05/14/2023 1149   APPEARANCEUR CLEAR 05/14/2023 1149   LABSPEC 1.009 05/14/2023 1149   PHURINE 5.0 05/14/2023 1149   GLUCOSEU 150 (A) 05/14/2023 1149   HGBUR NEGATIVE 05/14/2023 1149   BILIRUBINUR NEGATIVE 05/14/2023 1149   KETONESUR NEGATIVE 05/14/2023 1149   PROTEINUR NEGATIVE 05/14/2023 1149   NITRITE POSITIVE (A) 05/14/2023 1149   LEUKOCYTESUR SMALL (A) 05/14/2023 1149  Sepsis Labs: Lactic Acid, Venous No results found for: "LATICACIDVEN"  MICROBIOLOGY: No results found for this or any previous visit (from the past 240 hours).  RADIOLOGY STUDIES/RESULTS: DG Chest Portable 1 View Result Date: 08/11/2023 CLINICAL DATA:  73 year old female with wheezing.  COPD, emphysema. EXAM: PORTABLE CHEST 1 VIEW COMPARISON:  Chest radiographs 07/15/2023 and earlier. FINDINGS: Portable AP upright view at 0705 hours. Chronic emphysema with right basilar scarring and chronic right pleural effusion which do not appear significantly changed from a 2023 CT. Stable lung volumes and mediastinal contours. Stable trachea. No pneumothorax, pulmonary edema or acute lung opacity. Partially visible chronic ACDF. Stable visualized osseous structures. Negative visible bowel gas. IMPRESSION: 1. Stable chronic Emphysema (ICD10-J43.9) and right basilar scarring/pleural effusion. 2. No acute cardiopulmonary abnormality. Electronically Signed   By: Marlise Simpers M.D.   On: 08/11/2023 07:26     LOS: 0 days   Kimberly Penna, MD  [facility]    To contact the attending provider between 7A-7P or the covering provider during after hours 7P-7A, please log into the web site [email or web address] and access using universal [place] password for that web site. If you do not have the password, please call the hospital operator.  08/12/2023, 8:38 AM

## 2023-08-13 ENCOUNTER — Encounter (HOSPITAL_COMMUNITY): Payer: Self-pay | Admitting: Internal Medicine

## 2023-08-13 DIAGNOSIS — J449 Chronic obstructive pulmonary disease, unspecified: Secondary | ICD-10-CM | POA: Diagnosis not present

## 2023-08-13 DIAGNOSIS — N179 Acute kidney failure, unspecified: Secondary | ICD-10-CM | POA: Diagnosis not present

## 2023-08-13 DIAGNOSIS — J9611 Chronic respiratory failure with hypoxia: Secondary | ICD-10-CM | POA: Diagnosis not present

## 2023-08-13 DIAGNOSIS — E876 Hypokalemia: Secondary | ICD-10-CM | POA: Diagnosis not present

## 2023-08-13 LAB — BASIC METABOLIC PANEL WITH GFR
Anion gap: 8 (ref 5–15)
BUN: 47 mg/dL — ABNORMAL HIGH (ref 8–23)
CO2: 34 mmol/L — ABNORMAL HIGH (ref 22–32)
Calcium: 8.5 mg/dL — ABNORMAL LOW (ref 8.9–10.3)
Chloride: 92 mmol/L — ABNORMAL LOW (ref 98–111)
Creatinine, Ser: 2.27 mg/dL — ABNORMAL HIGH (ref 0.44–1.00)
GFR, Estimated: 22 mL/min — ABNORMAL LOW (ref >60)
Glucose, Bld: 159 mg/dL — ABNORMAL HIGH (ref 70–99)
Potassium: 3.6 mmol/L (ref 3.5–5.1)
Sodium: 134 mmol/L — ABNORMAL LOW (ref 135–145)

## 2023-08-13 LAB — MAGNESIUM: Magnesium: 2.4 mg/dL (ref 1.7–2.4)

## 2023-08-13 MED ORDER — POTASSIUM CHLORIDE 20 MEQ PO PACK
40.0000 meq | PACK | Freq: Once | ORAL | Status: AC
  Administered 2023-08-13: 40 meq via ORAL
  Filled 2023-08-13: qty 2

## 2023-08-13 MED ORDER — ISOSORBIDE DINITRATE 10 MG PO TABS
20.0000 mg | ORAL_TABLET | Freq: Three times a day (TID) | ORAL | Status: DC
  Administered 2023-08-13 – 2023-08-14 (×3): 20 mg via ORAL
  Filled 2023-08-13 (×4): qty 2

## 2023-08-13 MED ORDER — SODIUM CHLORIDE 0.9 % IV SOLN
INTRAVENOUS | Status: AC
  Administered 2023-08-13: 50 mL/h via INTRAVENOUS

## 2023-08-13 NOTE — Progress Notes (Signed)
 PROGRESS NOTE        PATIENT DETAILS Name: Maria Arroyo Age: 73 y.o. Sex: female Date of Birth: 1950/06/08 Admit Date: 08/11/2023 Admitting Physician Lena Qualia, MD AOZ:[id number], Rella Cardinal, MD  Brief Summary: Patient is a 73 y.o.  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  Objective: Vitals: Blood pressure (!) 156/73, pulse 72, temperature 98.1 F (36.7 C), temperature source Oral, resp. rate 15, SpO2 92%.   Exam: Gen Exam:Alert awake-not in any distress HEENT:atraumatic, normocephalic Chest: B/L clear to auscultation anteriorly CVS:S1S2 regular Abdomen:soft non tender, non distended Extremities:no edema Neurology: Non focal Skin: no rash  Pertinent Labs/Radiology:    Latest Ref Rng & Units 08/12/2023    4:55 AM 08/11/2023    6:57 AM 07/15/2023    8:13 AM  CBC  WBC 4.0 - 10.5 K/uL 7.2  8.0  8.6   Hemoglobin 12.0 - 15.0 g/dL 9.3  78.4  69.6   Hematocrit 36.0 - 46.0 % 30.0  34.7  34.0   Platelets 150 - 400 K/uL 209  247  273     Lab Results  Component Value Date   NA 134 (L) 08/13/2023   K 3.6 08/13/2023   CL 92 (L) 08/13/2023   CO2 34 (H) 08/13/2023      Assessment/Plan: Hypokalemia Thought to be due to use of Demadex /HCTZ K has finally normalized-but still borderline low-replete again today-and recheck tomorrow  AKI on CKD 4 AKI likely hemodynamically mediated due to diuretic use (baseline creatinine appears to be around<2) UA negative for proteinuria Creatinine slowly improving-not yet at baseline-continue gentle  hydration for a few more hours today-and recheck electrolytes tomorrow. . Avoid nephrotoxic agents  Normocytic anemia Likely secondary to CKD Had a recent hospitalization for anemia-EGD/colonoscopy was negative for bleeding source.  Follow CBC periodically  Prolonged QTc interval Secondary to hypokalemia-however does have LBBB at baseline upon review of prior EKGs going back to February 2025 At time keep K> 4, Mg>2 Avoid QTc prolonging agents Continue telemetry monitoring.  COPD on home oxygen 3 L Stable-not in exacerbation Bronchodilators  History of squamous cell carcinoma of the right lung-s/p right lobectomy 2020 Supportive care  HTN BP remains on the higher side Continue hydralazine  50 mg 3 times daily Increase Isordil  to 20 mg 3 times daily Follow/optimize.  HLD Statin  Chronic HFpEF Euvolemic Diuretics on hold  PAF Sinus rhythm Telemetry monitoring Amiodarone /Eliquis   Mood disorder Stable BuSpar /Effexor   Chronic pain syndrome (back/hip/legs) As needed oxycodone   Tobacco abuse Counseled Transdermal nicotine   Class 1 Obesity: Estimated body mass index is 30.12 kg/m as calculated from the following:   Height as of 07/04/23: 5\' 1"  (1.549 m).   Weight as of 07/06/23: 72.3 kg.   Code status:   Code Status: Full Code   DVT Prophylaxis: apixaban  (ELIQUIS ) tablet 5 mg    Family Communication: None at bedside   Disposition Plan: Status is: Observation The patient will require care spanning > 2 midnights and should be  moved to inpatient because: Severity of illness   Planned Discharge Destination:Home   Diet: Diet Order             Diet Heart Room service appropriate? Yes; Fluid consistency: Thin  Diet effective now                     Antimicrobial agents: Anti-infectives (From admission, onward)    Start     Dose/Rate Route Frequency Ordered Stop   08/11/23 1100  fosfomycin (MONUROL) packet 3 g  Status:  Discontinued        3 g Oral   Once 08/11/23 1048 08/11/23 1048        MEDICATIONS: Scheduled Meds:  amiodarone   200 mg Oral Daily   apixaban   5 mg Oral BID   busPIRone   15 mg Oral BID   feeding supplement  237 mL Oral BID BM   ferrous sulfate   325 mg Oral Q breakfast   gabapentin   300 mg Oral BID   hydrALAZINE   50 mg Oral Q8H   isosorbide  dinitrate  10 mg Oral TID   nicotine   21 mg Transdermal Daily   rosuvastatin   40 mg Oral Daily   sodium chloride  flush  3 mL Intravenous Q12H   venlafaxine  XR  150 mg Oral Q breakfast   Continuous Infusions:   PRN Meds:.acetaminophen  **OR** acetaminophen , ipratropium-albuterol , labetalol, oxyCODONE , trimethobenzamide   I have personally reviewed following labs and imaging studies  LABORATORY DATA: CBC: Recent Labs  Lab 08/11/23 0657 08/12/23 0455  WBC 8.0 7.2  HGB 11.1* 9.3*  HCT 34.7* 30.0*  MCV 84.8 86.5  PLT 247 209    Basic Metabolic Panel: Recent Labs  Lab 08/11/23 0655 08/11/23 0657 08/11/23 1455 08/12/23 0455 08/13/23 0556  NA  --  134* 133* 132* 134*  K  --  <2.0* 2.9* 3.1* 3.6  CL  --  74* 78* 83* 92*  CO2  --  44* 40* 39* 34*  GLUCOSE  --  119* 184* 182* 159*  BUN  --  49* 45* 45* 47*  CREATININE  --  2.42* 2.43* 2.50* 2.27*  CALCIUM   --  9.7 9.2 8.8* 8.5*  MG 2.4  --   --   --  2.4    GFR: CrCl cannot be calculated (Unknown ideal weight.).  Liver Function Tests: Recent Labs  Lab 08/11/23 0657  AST 46*  ALT 34  ALKPHOS 73  BILITOT 0.5  PROT 7.4  ALBUMIN 3.5   No results for input(s): "LIPASE", "AMYLASE" in the last 168 hours. No results for input(s): "AMMONIA" in the last 168 hours.  Coagulation Profile: Recent Labs  Lab 08/11/23 0655  INR 1.4*    Cardiac Enzymes: No results for input(s): "CKTOTAL", "CKMB", "CKMBINDEX", "TROPONINI" in the last 168 hours.  BNP (last 3 results) No results for input(s): "PROBNP" in the last 8760 hours.  Lipid Profile: No results for input(s): "CHOL", "HDL", "LDLCALC", "TRIG",  "CHOLHDL", "LDLDIRECT" in the last 72 hours.  Thyroid Function Tests: Recent Labs    08/11/23 0923  TSH 1.590    Anemia Panel: No results for input(s): "VITAMINB12", "FOLATE", "FERRITIN", "TIBC", "IRON ", "RETICCTPCT" in the last 72 hours.  Urine analysis:    Component Value Date/Time   COLORURINE YELLOW 08/11/2023 1149   APPEARANCEUR CLEAR 08/11/2023 1149   LABSPEC 1.008 08/11/2023 1149   PHURINE 7.0 08/11/2023 1149   GLUCOSEU 50 (A) 08/11/2023 1149   HGBUR SMALL (A) 08/11/2023 1149   BILIRUBINUR NEGATIVE 08/11/2023  1149   KETONESUR NEGATIVE 08/11/2023 1149   PROTEINUR 30 (A) 08/11/2023 1149   NITRITE NEGATIVE 08/11/2023 1149   LEUKOCYTESUR TRACE (A) 08/11/2023 1149    Sepsis Labs: Lactic Acid, Venous No results found for: "LATICACIDVEN"  MICROBIOLOGY: No results found for this or any previous visit (from the past 240 hours).  RADIOLOGY STUDIES/RESULTS: No results found.    LOS: 1 day   Kimberly Penna, MD  [facility]    To contact the attending provider between 7A-7P or the covering provider during after hours 7P-7A, please log into the web site [email or web address] and access using universal [place] password for that web site. If you do not have the password, please call the hospital operator.  08/13/2023, 10:15 AM

## 2023-08-13 NOTE — Progress Notes (Addendum)
   08/13/23 0937  TOC Brief Assessment  Insurance and Status Reviewed (Medicare A and B)  Patient has primary care physician Yes ([place], Rella Cardinal, MD)  Home environment has been reviewed From home with Husband  Prior level of function: Independent with RW  Prior/Current Home Services No current home services  Social Drivers of Health Review SDOH reviewed interventions complete (Smoking cessation information attached to AVS)  Readmission risk has been reviewed Yes (40 %  No of ed visits in last 6 months (4) Number of inpatient medication orders (38))  Transition of care needs transition of care needs identified, TOC will continue to follow (OPPT to be referred)   TOC will continue to follow patient for any additional discharge needs   Home O2 provided by [employer]- Patient does have portable here for transport home. Husband will drive

## 2023-08-13 NOTE — Evaluation (Signed)
 Occupational Therapy Evaluation and Discharge Patient Details Name: Maria Arroyo MRN: [id number] DOB: [birth year] Today's Date: 08/13/2023   History of Present Illness   73 y.o.  female was referred to the ED 5/14 by PCP for severe hypokalemia (<2).  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     Clinical Impressions Pt is typically mod I with assist for ADL/IADL PRN from niece and husband. She lives at home with her husband and her [place]. Today she is functioning at baseline - overall supervision level for UB, LB, toilet transfers, sink level grooming (3 sequential tasks), good hand placement and safety awareness for RW management. Pt with no questions or concerns, no DOE during session, SpO2 >90% on 3L, HR in the 80's throughout. Reports feeling much better than at admission. At this time education complete, OT will sign off and defer further activity tolerance and mobility to PT/mobility specialist team.      If plan is discharge home, recommend the following:   A little help with bathing/dressing/bathroom;Assistance with cooking/housework;Assist for transportation     Functional Status Assessment   Patient has had a recent decline in their functional status and demonstrates the ability to make significant improvements in function in a reasonable and predictable amount of time.     Equipment Recommendations   None recommended by OT (Pt has appropriate DME)     Recommendations for Other Services   PT consult     Precautions/Restrictions   Precautions Precautions: Fall Recall of Precautions/Restrictions: Intact Restrictions Weight Bearing Restrictions Per Provider Order: No     Mobility Bed Mobility Overal bed mobility: Modified Independent             General bed mobility comments: extra time no assist    Transfers Overall transfer level: Needs  assistance Equipment used: Rolling walker (2 wheels) Transfers: Sit to/from Stand Sit to Stand: Supervision           General transfer comment: supervision for safety      Balance Overall balance assessment: Needs assistance Sitting-balance support: No upper extremity supported, Feet supported Sitting balance-Leahy Scale: Good     Standing balance support: Single extremity supported Standing balance-Leahy Scale: Poor Standing balance comment: dependent on external source either RW or sink surface                           ADL either performed or assessed with clinical judgement   ADL Overall ADL's : At baseline                                       General ADL Comments: Pt able to demonstrate UB and LB ADL, toilet transfer, peri care, standing grooming (hand washing, oral care, face washing) No DOE     Vision Baseline Vision/History: 1 Wears glasses Patient Visual Report: No change from baseline Vision Assessment?: No apparent visual deficits     Perception         Praxis         Pertinent Vitals/Pain Pain Assessment Pain Assessment: No/denies pain     Extremity/Trunk Assessment Upper Extremity Assessment Upper Extremity Assessment: Overall WFL for tasks assessed   Lower Extremity Assessment Lower Extremity Assessment: Defer to PT evaluation       Communication Communication Communication:  No apparent difficulties   Cognition Arousal: Alert Behavior During Therapy: WFL for tasks assessed/performed Cognition: No apparent impairments                               Following commands: Intact       Cueing  General Comments   Cueing Techniques: Verbal cues;Gestural cues  SpO2 >90% on 3L, HR maintained around 80 trhoughout session, no DOE   Exercises     Shoulder Instructions      Home Living Family/patient expects to be discharged to:: Private residence Living Arrangements: Spouse/significant  other Available Help at Discharge: Family;Available PRN/intermittently (Husband works, niece checks on pt during day) Type of Home: House Home Access: Stairs to enter (3 in back, 5 in front) Secretary/administrator of Steps: 3 Entrance Stairs-Rails: Right Home Layout: One level     Bathroom Shower/Tub: Producer, television/film/video: Handicapped height Bathroom Accessibility: Yes How Accessible: Accessible via walker Home Equipment: Cane - single point;Grab bars - tub/shower;Shower seat;BSC/3in1;Rollator (4 wheels);Hand held shower head   Additional Comments: niece is available      Prior Functioning/Environment Prior Level of Function : Independent/Modified Independent             Mobility Comments: Uses rollator, denies falls ADLs Comments: niece assists with iADLs and ADLs as needed    OT Problem List: Decreased activity tolerance   OT Treatment/Interventions:        OT Goals(Current goals can be found in the care plan section)   Acute Rehab OT Goals Patient Stated Goal: keep feeling better and better OT Goal Formulation: With patient Time For Goal Achievement: 08/27/23 Potential to Achieve Goals: Good   OT Frequency:       Co-evaluation              AM-PAC OT "6 Clicks" Daily Activity     Outcome Measure Help from another person eating meals?: None Help from another person taking care of personal grooming?: A Little Help from another person toileting, which includes using toliet, bedpan, or urinal?: A Little Help from another person bathing (including washing, rinsing, drying)?: A Little Help from another person to put on and taking off regular upper body clothing?: A Little Help from another person to put on and taking off regular lower body clothing?: A Little 6 Click Score: 19   End of Session Equipment Utilized During Treatment: Gait belt;Rolling walker (2 wheels);Oxygen (3L) Nurse Communication: Mobility status  Activity Tolerance: Patient  tolerated treatment well Patient left: in chair;with call bell/phone within reach  OT Visit Diagnosis: Muscle weakness (generalized) (M62.81)                Time: 1610-9604 OT Time Calculation (min): 27 min Charges:  OT General Charges $OT Visit: 1 Visit OT Evaluation $OT Eval Low Complexity: 1 Low OT Treatments $Self Care/Home Management : 8-22 mins  Maria Arroyo OTR/L Acute Rehabilitation Services Office: [phone]  Ebony Goldstein [facility] 08/13/2023, 12:20 PM

## 2023-08-14 ENCOUNTER — Other Ambulatory Visit (HOSPITAL_COMMUNITY): Payer: Self-pay

## 2023-08-14 DIAGNOSIS — E876 Hypokalemia: Secondary | ICD-10-CM | POA: Diagnosis not present

## 2023-08-14 LAB — BASIC METABOLIC PANEL WITH GFR
Anion gap: 12 (ref 5–15)
BUN: 44 mg/dL — ABNORMAL HIGH (ref 8–23)
CO2: 29 mmol/L (ref 22–32)
Calcium: 9.1 mg/dL (ref 8.9–10.3)
Chloride: 96 mmol/L — ABNORMAL LOW (ref 98–111)
Creatinine, Ser: 1.9 mg/dL — ABNORMAL HIGH (ref 0.44–1.00)
GFR, Estimated: 28 mL/min — ABNORMAL LOW (ref >60)
Glucose, Bld: 111 mg/dL — ABNORMAL HIGH (ref 70–99)
Potassium: 3.9 mmol/L (ref 3.5–5.1)
Sodium: 137 mmol/L (ref 135–145)

## 2023-08-14 LAB — MAGNESIUM: Magnesium: 2.3 mg/dL (ref 1.7–2.4)

## 2023-08-14 MED ORDER — POTASSIUM CHLORIDE ER 20 MEQ PO TBCR: 20.0000 meq | EXTENDED_RELEASE_TABLET | Freq: Every day | ORAL | 0 refills | Status: DC

## 2023-08-14 MED ORDER — TORSEMIDE 20 MG PO TABS: 20.0000 mg | ORAL_TABLET | Freq: Every day | ORAL | Status: DC

## 2023-08-14 MED ORDER — HYDRALAZINE HCL 100 MG PO TABS
50.0000 mg | ORAL_TABLET | Freq: Three times a day (TID) | ORAL | 0 refills | Status: DC
  Filled 2023-08-14: qty 90, 60d supply, fill #0

## 2023-08-14 MED ORDER — NICOTINE 21 MG/24HR TD PT24
21.0000 mg | MEDICATED_PATCH | Freq: Every day | TRANSDERMAL | 0 refills | Status: DC
  Filled 2023-08-14: qty 28, 28d supply, fill #0

## 2023-08-14 NOTE — Plan of Care (Signed)

## 2023-08-14 NOTE — TOC Transition Note (Signed)
 Transition of Care [facility]) - Discharge Note   Patient Details  Name: Maria Arroyo MRN: [id number] Date of Birth: December 29, 1950  Transition of Care [facility]) CM/SW Contact:  Omie Bickers, RN Phone Number: 08/14/2023, 8:51 AM   Clinical Narrative:      Maria Arroyo w patient over the phone. Explained HH services, and she is agreeable to proceed, no preference for provider. [facility] accepted. No DME needs  Final next level of care: Home w Home Health Services Barriers to Discharge: No Barriers Identified   Patient Goals and CMS Choice Patient states their goals for this hospitalization and ongoing recovery are:: to go home CMS Medicare.gov Compare Post Acute Care list provided to:: Patient Choice offered to / list presented to : Patient      Discharge Placement                       Discharge Plan and Services Additional resources added to the After Visit Summary for                  DME Arranged: N/A         HH Arranged: PT HH Agency: [facility] Date [facility] Agency Contacted: 08/14/23 Time HH Agency Contacted: [phone] Representative spoke with at [facility] Agency: cheryl  Social Drivers of Health (SDOH) Interventions SDOH Screenings   Food Insecurity: Food Insecurity Present (08/11/2023)  Housing: Low Risk  (08/11/2023)  Transportation Needs: No Transportation Needs (08/11/2023)  Utilities: Not At Risk (08/11/2023)  Depression (PHQ2-9): Low Risk  (03/17/2018)  Financial Resource Strain: Low Risk  (05/12/2023)   Received from [facility]  Physical Activity: Unknown (03/10/2023)   Received from [facility]  Social Connections: Moderately Isolated (08/11/2023)  Stress: No Stress Concern Present (03/10/2023)   [facility]  Tobacco Use: High Risk (08/13/2023)     Readmission Risk Interventions    07/06/2023   11:09 AM  Readmission Risk Prevention Plan  Transportation Screening Complete  HRI or Home Care Consult Complete  Social Work  Consult for Recovery Care Planning/Counseling Complete  Palliative Care Screening Not Applicable  Medication Review Oceanographer) Referral to Pharmacy

## 2023-08-14 NOTE — Discharge Summary (Signed)
 Maria Arroyo WUJ:[id number] DOB: [birth year] DOA: 08/11/2023  PCP: Liliane Rei, MD  Admit date: 08/11/2023  Discharge date: 08/14/2023  Admitted From: Home   Disposition:  Home   Recommendations for Outpatient Follow-up:   Follow up with PCP in 1-2 weeks  PCP Please obtain BMP/CBC, 2 view CXR in 1week,  (see Discharge instructions)   PCP Please follow up on the following pending results:    Home Health: PT   Equipment/Devices: as below  Consultations: None  Discharge Condition: Stable    CODE STATUS: Full    Diet Recommendation: Heart Healthy with 1.5 L fluid restriction per day  Diet Order             Diet Heart Room service appropriate? Yes; Fluid consistency: Thin  Diet effective now                    Chief Complaint  Patient presents with   abnormal labs     Brief history of present illness from the day of admission and additional interim summary    73 y.o.  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  Will discontinue HCTZ, resume home Demadex  with low-dose potassium from coming Monday, follow-up with PCP in 5 days for repeat check of weight, blood pressure and BMP.  Outpatient follow-up with nephrology and cardiology also recommended PCP to arrange.    Normocytic  anemia Likely secondary to CKD Had a recent hospitalization for anemia-EGD/colonoscopy was negative for bleeding source.  Follow CBC periodically by PCP in the outpatient setting   Prolonged QTc interval Secondary to hypokalemia-however does have LBBB at baseline upon review of prior EKGs going back to February 2025 Electrolytes replaced.  Stable on telemetry.  PCP to monitor request outpatient cardiology follow-up   COPD on home oxygen 3 L Stable-not in exacerbation Bronchodilators   History of squamous cell carcinoma of the right lung-s/p right lobectomy 2020 Supportive care   HTN BP remains on the higher side, of note last time when she was admitted her beta-blocker was discontinued upon discharge due to episodes of bradycardia, continue to hold, on combination of Imdur and hydralazine , dose adjusted prior to discharge for better control, PCP to  monitor and adjust in the outpatient setting.  Also resuming Demadex  in 2 days.   HLD Statin   Chronic HFpEF Euvolemic Diuretics as above.   PAF Sinus rhythm Telemetry monitoring Amiodarone /Eliquis , beta-blocker was discontinued last admission upon discharge due to episodes of bradycardia in the hospital on telemetry.   Mood disorder Stable BuSpar /Effexor    Chronic pain syndrome (back/hip/legs) As needed oxycodone    Tobacco abuse Counseled Transdermal nicotine    Class 1 Obesity: Estimated body mass index is 30.12, follow-up with PCP for weight loss.   Discharge diagnosis     Principal Problem:   Hypokalemia Active Problems:   COPD (chronic obstructive pulmonary disease) (HCC)   Chronic hypoxic respiratory failure (HCC)   Acute kidney injury superimposed on chronic kidney disease (HCC)   Elevated troponin   Long QT interval   Normocytic anemia   Atrial fibrillation (HCC)   Essential hypertension   S/P lobectomy of lung   History of lung cancer   Anxiety and depression   Hyperlipidemia   Cigarette  smoker    Discharge instructions    Discharge Instructions     Discharge instructions   Complete by: As directed    Follow with Primary MD Liliane Rei, MD in 5-7 days   Get CBC, CMP -  checked next visit with your primary MD    Activity: As tolerated with Full fall precautions use walker/cane & assistance as needed  Disposition Hom   Diet: Heart Healthy with 1.5 lit fluid restriction per day  Special Instructions: If you have smoked or chewed Tobacco  in the last 2 yrs please stop smoking, stop any regular Alcohol  and or any Recreational drug use.  On your next visit with your primary care physician please Get Medicines reviewed and adjusted.  Please request your Prim.MD to go over all Hospital Tests and Procedure/Radiological results at the follow up, please get all Hospital records sent to your Prim MD by signing hospital release before you go home.  If you experience worsening of your admission symptoms, develop shortness of breath, life threatening emergency, suicidal or homicidal thoughts you must seek medical attention immediately by calling 911 or calling your MD immediately  if symptoms less severe.  You Must read complete instructions/literature along with all the possible adverse reactions/side effects for all the Medicines you take and that have been prescribed to you. Take any new Medicines after you have completely understood and accpet all the possible adverse reactions/side effects.   Do not drive when taking Pain medications.  Do not take more than prescribed Pain, Sleep and Anxiety Medications  Wear Seat belts while driving.   Increase activity slowly   Complete by: As directed        Discharge Medications   Allergies as of 08/14/2023       Reactions   Ace Inhibitors Other (See Comments)   Worsening renal function   Codeine Nausea Only        Medication List     STOP taking these medications    allopurinol  100 MG tablet Commonly known  as: ZYLOPRIM    bisoprolol  5 MG tablet Commonly known as: ZEBETA    hydrochlorothiazide  12.5 MG tablet Commonly known as: HYDRODIURIL        TAKE these medications    albuterol  108 (90 Base) MCG/ACT inhaler Commonly known as: VENTOLIN  HFA Inhale 2 puffs into the lungs every 6 (six) hours as needed for shortness of breath or wheezing.   amiodarone  200 MG tablet Commonly known as:  PACERONE  Take 1 tablet (200 mg total) by mouth daily.   apixaban  5 MG Tabs tablet Commonly known as: ELIQUIS  Take 1 tablet (5 mg total) by mouth 2 (two) times daily.   busPIRone  15 MG tablet Commonly known as: BUSPAR  Take 15 mg by mouth 2 (two) times daily.   FeroSul 325 (65 Fe) MG tablet Generic drug: ferrous sulfate  Take 1 tablet by mouth daily.   gabapentin  300 MG capsule Commonly known as: NEURONTIN  Take 1 capsule (300 mg total) by mouth 2 (two) times daily.   hydrALAZINE  100 MG tablet Commonly known as: APRESOLINE  Take 0.5 tablets (50 mg total) by mouth every 8 (eight) hours. What changed:  medication strength how much to take   isosorbide  dinitrate 10 MG tablet Commonly known as: ISORDIL  Take 1 tablet (10 mg total) by mouth 3 (three) times daily.   loratadine  10 MG tablet Commonly known as: CLARITIN  Take 1 tablet (10 mg total) by mouth daily.   multivitamin with minerals Tabs tablet Take 1 tablet by mouth daily.   nicotine  21 mg/24hr patch Commonly known as: NICODERM CQ  - dosed in mg/24 hours Place 1 patch (21 mg total) onto the skin daily.   nitroGLYCERIN  0.4 MG SL tablet Commonly known as: NITROSTAT  PLACE 1 TABLET UNDER THE TONGUE EVERY 5 MINUTES AS NEEDED CHEST PAIN. What changed: See the new instructions.   oxyCODONE  5 MG immediate release tablet Commonly known as: Oxy IR/ROXICODONE  Take 5 mg by mouth every 6 (six) hours as needed for moderate pain (pain score 4-6) or severe pain (pain score 7-10).   Potassium Chloride  ER 20 MEQ Tbcr Take 1 tablet (20 mEq total) by  mouth daily.   rosuvastatin  40 MG tablet Commonly known as: CRESTOR  TAKE 1 TABLET BY MOUTH DAILY What changed:  how much to take how to take this when to take this   torsemide  20 MG tablet Commonly known as: DEMADEX  Take 1 tablet (20 mg total) by mouth daily. Start taking on: Aug 16, 2023 What changed: These instructions start on Aug 16, 2023. If you are unsure what to do until then, ask your doctor or other care provider.   venlafaxine  XR 150 MG 24 hr capsule Commonly known as: EFFEXOR -XR Take 150 mg by mouth daily.   VITAMIN C PO Take 1 tablet by mouth daily.   Vitamin D 50 MCG (2000 UT) tablet Take 2,000 Units by mouth daily.         Follow-up Information     Bayne, Rella Cardinal, MD. Schedule an appointment as soon as possible for a visit in 1 week(s).   Specialty: Family Medicine Contact information: [address] [place] [address] [phone]         [place], Peter M, MD. Schedule an appointment as soon as possible for a visit in 1 week(s).   Specialty: Cardiology Contact information: [address] [place] [address] [phone]                 Major procedures and Radiology Reports - PLEASE review detailed and final reports thoroughly  -       DG Chest Portable 1 View Result Date: 08/11/2023 CLINICAL DATA:  73 year old female with wheezing.  COPD, emphysema. EXAM: PORTABLE CHEST 1 VIEW COMPARISON:  Chest radiographs 07/15/2023 and earlier. FINDINGS: Portable AP upright view at 0705 hours. Chronic emphysema with right basilar scarring and chronic right pleural effusion which do not appear significantly changed from a 2023 CT. Stable lung volumes and  mediastinal contours. Stable trachea. No pneumothorax, pulmonary edema or acute lung opacity. Partially visible chronic ACDF. Stable visualized osseous structures. Negative visible bowel gas. IMPRESSION: 1. Stable chronic Emphysema (ICD10-J43.9) and right basilar  scarring/pleural effusion. 2. No acute cardiopulmonary abnormality. Electronically Signed   By: Marlise Simpers M.D.   On: 08/11/2023 07:26    Micro Results     No results found for this or any previous visit (from the past 240 hours).  Today   Subjective    Ramonda Galyon today has no headache,no chest abdominal pain,no new weakness tingling or numbness, feels much better wants to go home today.     Objective   Blood pressure 150/85, pulse 76, temperature 98.1 F (36.7 C), temperature source Oral, resp. rate 16, SpO2 100%.   Intake/Output Summary (Last 24 hours) at 08/14/2023 0851 Last data filed at 08/14/2023 0600 Gross per 24 hour  Intake 1853 ml  Output 1700 ml  Net 153 ml    Exam  Awake Alert, No new F.N deficits,    [place].AT,PERRAL Supple Neck,   Symmetrical Chest wall movement, Good air movement bilaterally, CTAB RRR,No Gallops,   +ve B.Sounds, Abd Soft, Non tender,  No Cyanosis, Clubbing or edema    Data Review   Recent Labs  Lab 08/11/23 0657 08/12/23 0455  WBC 8.0 7.2  HGB 11.1* 9.3*  HCT 34.7* 30.0*  PLT 247 209  MCV 84.8 86.5  MCH 27.1 26.8  MCHC 32.0 31.0  RDW 20.7* 20.8*    Recent Labs  Lab 08/11/23 0655 08/11/23 0657 08/11/23 0923 08/11/23 1455 08/12/23 0455 08/13/23 0556 08/14/23 0732  NA  --  134*  --  133* 132* 134* 137  K  --  <2.0*  --  2.9* 3.1* 3.6 3.9  CL  --  74*  --  78* 83* 92* 96*  CO2  --  44*  --  40* 39* 34* 29  ANIONGAP  --  16*  --  15 10 8 12   GLUCOSE  --  119*  --  184* 182* 159* 111*  BUN  --  49*  --  45* 45* 47* 44*  CREATININE  --  2.42*  --  2.43* 2.50* 2.27* 1.90*  AST  --  46*  --   --   --   --   --   ALT  --  34  --   --   --   --   --   ALKPHOS  --  73  --   --   --   --   --   BILITOT  --  0.5  --   --   --   --   --   ALBUMIN  --  3.5  --   --   --   --   --   INR 1.4*  --   --   --   --   --   --   TSH  --   --  1.590  --   --   --   --   MG 2.4  --   --   --   --  2.4 2.3  CALCIUM   --  9.7  --  9.2  8.8* 8.5* 9.1    Total Time in preparing paper work, data evaluation and todays exam - 35 minutes  Signature  -    Lynnwood Sauer M.D on 08/14/2023 at 8:51 AM   -  To page go to [email or web address]

## 2023-08-14 NOTE — Discharge Instructions (Signed)
 Follow with Primary MD Liliane Rei, MD in 5-7 days   Get CBC, CMP -  checked next visit with your primary MD    Activity: As tolerated with Full fall precautions use walker/cane & assistance as needed  Disposition Hom   Diet: Heart Healthy with 1.5 lit fluid restriction per day  Special Instructions: If you have smoked or chewed Tobacco  in the last 2 yrs please stop smoking, stop any regular Alcohol  and or any Recreational drug use.  On your next visit with your primary care physician please Get Medicines reviewed and adjusted.  Please request your Prim.MD to go over all Hospital Tests and Procedure/Radiological results at the follow up, please get all Hospital records sent to your Prim MD by signing hospital release before you go home.  If you experience worsening of your admission symptoms, develop shortness of breath, life threatening emergency, suicidal or homicidal thoughts you must seek medical attention immediately by calling 911 or calling your MD immediately  if symptoms less severe.  You Must read complete instructions/literature along with all the possible adverse reactions/side effects for all the Medicines you take and that have been prescribed to you. Take any new Medicines after you have completely understood and accpet all the possible adverse reactions/side effects.   Do not drive when taking Pain medications.  Do not take more than prescribed Pain, Sleep and Anxiety Medications  Wear Seat belts while driving.

## 2023-08-14 NOTE — Progress Notes (Signed)
 Pt voided several time good oral intake and output , bladder scan skipped charge nurse informed

## 2023-08-14 NOTE — Progress Notes (Signed)
 Physical Therapy Treatment Patient Details Name: Maria Arroyo MRN: [id number] DOB: 09-01-1950 Today's Date: 08/14/2023   History of Present Illness 73 y.o.  female was referred to the ED 5/14 by PCP for severe hypokalemia (<2).  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    PT Comments  Pt supine in bed on arrival this session,  PTA focused on stair training.  Pt tolerated well.  Assisted patient in lower and upper body dressing to prepare for d/c home.    If plan is discharge home, recommend the following: A little help with walking and/or transfers;A little help with bathing/dressing/bathroom;Assist for transportation;Help with stairs or ramp for entrance   Can travel by private vehicle        Equipment Recommendations  None recommended by PT    Recommendations for Other Services       Precautions / Restrictions Precautions Precautions: Fall Recall of Precautions/Restrictions: Intact Restrictions Weight Bearing Restrictions Per Provider Order: No     Mobility  Bed Mobility Overal bed mobility: Modified Independent             General bed mobility comments: extra time no assist    Transfers Overall transfer level: Needs assistance Equipment used: None Transfers: Sit to/from Stand Sit to Stand: Supervision           General transfer comment: supervision for safety    Ambulation/Gait Ambulation/Gait assistance: Supervision Gait Distance (Feet): 10 Feet (in room ambulation) Assistive device: Rolling walker (2 wheels) Gait Pattern/deviations: Step-through pattern, Decreased stride length Gait velocity: dec     General Gait Details: Pt performed short bout of gt in room to focus on stair training and to conserve energy.   Stairs Stairs: Yes Stairs assistance: Supervision Stair Management: One rail Right, Step to pattern Number of Stairs: 3 General  stair comments: Cues for sequencing and safety to negotiate x 3 stairs for safe entry.   Wheelchair Mobility     Tilt Bed    Modified Rankin (Stroke Patients Only)       Balance Overall balance assessment: Needs assistance Sitting-balance support: No upper extremity supported, Feet supported Sitting balance-Leahy Scale: Good     Standing balance support: Single extremity supported Standing balance-Leahy Scale: Poor Standing balance comment: dependent on external source railing on bed.                            Communication Communication Communication: No apparent difficulties  Cognition Arousal: Alert Behavior During Therapy: WFL for tasks assessed/performed   PT - Cognitive impairments: No family/caregiver present to determine baseline                         Following commands: Intact      Cueing Cueing Techniques: Verbal cues, Gestural cues  Exercises      General Comments        Pertinent Vitals/Pain Pain Assessment Pain Assessment: No/denies pain    Home Living                          Prior Function            PT Goals (current goals can now be found in the care plan section) Acute Rehab PT Goals Patient Stated Goal: Get well go home Potential to Achieve Goals: Good  Progress towards PT goals: Progressing toward goals    Frequency    Min 2X/week      PT Plan      Co-evaluation              AM-PAC PT "6 Clicks" Mobility   Outcome Measure  Help needed turning from your back to your side while in a flat bed without using bedrails?: None Help needed moving from lying on your back to sitting on the side of a flat bed without using bedrails?: A Little Help needed moving to and from a bed to a chair (including a wheelchair)?: A Little Help needed standing up from a chair using your arms (e.g., wheelchair or bedside chair)?: A Little Help needed to walk in hospital room?: A Little Help needed climbing  3-5 steps with a railing? : A Little 6 Click Score: 19    End of Session Equipment Utilized During Treatment: Gait belt;Oxygen Activity Tolerance: Patient tolerated treatment well Patient left: in chair;with call bell/phone within reach;with chair alarm set   PT Visit Diagnosis: Other abnormalities of gait and mobility (R26.89);Unsteadiness on feet (R26.81);Muscle weakness (generalized) (M62.81)     Time: 1021-1030 PT Time Calculation (min) (ACUTE ONLY): 9 min  Charges:    $Gait Training: 8-22 mins PT General Charges $$ ACUTE PT VISIT: 1 Visit                    Maria Arroyo , PTA Acute Rehabilitation Services Office [phone]    Reynolds Cea 08/14/2023, 12:02 PM

## 2023-08-14 NOTE — Plan of Care (Signed)

## 2023-08-14 NOTE — Progress Notes (Signed)
 Nsg Discharge Note  Admit Date:  08/11/2023 Discharge date: 08/14/2023   Maria Arroyo to be D/C'd Home per MD order.  AVS completed.  Patient/caregiver able to verbalize understanding.  Discharge Medication: Allergies as of 08/14/2023       Reactions   Ace Inhibitors Other (See Comments)   Worsening renal function   Codeine Nausea Only        Medication List     STOP taking these medications    allopurinol  100 MG tablet Commonly known as: ZYLOPRIM    bisoprolol  5 MG tablet Commonly known as: ZEBETA    hydrochlorothiazide  12.5 MG tablet Commonly known as: HYDRODIURIL        TAKE these medications    albuterol  108 (90 Base) MCG/ACT inhaler Commonly known as: VENTOLIN  HFA Inhale 2 puffs into the lungs every 6 (six) hours as needed for shortness of breath or wheezing.   amiodarone  200 MG tablet Commonly known as: PACERONE  Take 1 tablet (200 mg total) by mouth daily.   apixaban  5 MG Tabs tablet Commonly known as: ELIQUIS  Take 1 tablet (5 mg total) by mouth 2 (two) times daily.   busPIRone  15 MG tablet Commonly known as: BUSPAR  Take 15 mg by mouth 2 (two) times daily.   FeroSul 325 (65 Fe) MG tablet Generic drug: ferrous sulfate  Take 1 tablet by mouth daily.   gabapentin  300 MG capsule Commonly known as: NEURONTIN  Take 1 capsule (300 mg total) by mouth 2 (two) times daily.   hydrALAZINE  100 MG tablet Commonly known as: APRESOLINE  Take one-half tablet (50 mg total) by mouth every 8 (eight) hours. What changed:  medication strength how much to take   isosorbide  dinitrate 10 MG tablet Commonly known as: ISORDIL  Take 1 tablet (10 mg total) by mouth 3 (three) times daily.   loratadine  10 MG tablet Commonly known as: CLARITIN  Take 1 tablet (10 mg total) by mouth daily.   multivitamin with minerals Tabs tablet Take 1 tablet by mouth daily.   nicotine  21 mg/24hr patch Commonly known as: NICODERM CQ  - dosed in mg/24 hours Place 1 patch (21 mg  total) onto the skin daily.   nitroGLYCERIN  0.4 MG SL tablet Commonly known as: NITROSTAT  PLACE 1 TABLET UNDER THE TONGUE EVERY 5 MINUTES AS NEEDED CHEST PAIN. What changed: See the new instructions.   oxyCODONE  5 MG immediate release tablet Commonly known as: Oxy IR/ROXICODONE  Take 5 mg by mouth every 6 (six) hours as needed for moderate pain (pain score 4-6) or severe pain (pain score 7-10).   Potassium Chloride  ER 20 MEQ Tbcr Take 1 tablet (20 mEq total) by mouth daily.   rosuvastatin  40 MG tablet Commonly known as: CRESTOR  TAKE 1 TABLET BY MOUTH DAILY What changed:  how much to take how to take this when to take this   torsemide  20 MG tablet Commonly known as: DEMADEX  Take 1 tablet (20 mg total) by mouth daily. Start taking on: Aug 16, 2023 What changed: These instructions start on Aug 16, 2023. If you are unsure what to do until then, ask your doctor or other care provider.   venlafaxine  XR 150 MG 24 hr capsule Commonly known as: EFFEXOR -XR Take 150 mg by mouth daily.   VITAMIN C PO Take 1 tablet by mouth daily.   Vitamin D 50 MCG (2000 UT) tablet Take 2,000 Units by mouth daily.        Discharge Assessment: Vitals:   08/14/23 0347 08/14/23 0809  BP: (!) 157/85 (!) 158/75  Pulse:  76  Resp:  16  Temp: 98.7 F (37.1 C) 98.1 F (36.7 C)  SpO2:  100%   Skin clean, dry and intact without evidence of skin break down, no evidence of skin tears noted. IV catheter discontinued intact. Site without signs and symptoms of complications - no redness or edema noted at insertion site, patient denies c/o pain - only slight tenderness at site.  Dressing with slight pressure applied.  D/c Instructions-Education: Discharge instructions given to patient/family with verbalized understanding. D/c education completed with patient/family including follow up instructions, medication list, d/c activities limitations if indicated, with other d/c instructions as indicated by MD -  patient able to verbalize understanding, all questions fully answered. Patient instructed to return to ED, call 911, or call MD for any changes in condition.  Patient escorted via WC, and D/C home via private auto.  Danni Shima, Sable Cram, RN 08/14/2023 11:09 AM

## 2023-08-15 NOTE — Progress Notes (Signed)
 Ordered to see if the patient was having heart failure

## 2023-08-24 ENCOUNTER — Other Ambulatory Visit (HOSPITAL_COMMUNITY): Payer: Self-pay

## 2023-08-30 ENCOUNTER — Other Ambulatory Visit (HOSPITAL_COMMUNITY): Payer: Self-pay

## 2023-09-02 ENCOUNTER — Other Ambulatory Visit (HOSPITAL_COMMUNITY): Payer: Self-pay

## 2023-09-22 ENCOUNTER — Ambulatory Visit: Admitting: Physician Assistant

## 2023-09-29 ENCOUNTER — Other Ambulatory Visit: Payer: Self-pay | Admitting: Nephrology

## 2023-09-29 DIAGNOSIS — N184 Chronic kidney disease, stage 4 (severe): Secondary | ICD-10-CM

## 2023-09-29 LAB — LAB REPORT - SCANNED
Creatinine, POC: 71.7 mg/dL
EGFR: 27

## 2023-11-03 NOTE — Progress Notes (Signed)
 Subjective   Patient ID:  Maria Arroyo is a 73 y.o. (DOB 26-May-1950) female.      Patient presents with  . Swelling follow up    No improvement in pedal edema with doubling of torsemide  Feels short of breath Clem[place] a few days ago due to heavy legs Has bruises on right thorax  Short Social History[1] Family History  Problem Relation Age of Onset  . Heart disease Mother   . Stroke Father   . Heart disease Father   . Heart disease Brother    Past Medical History:  Diagnosis Date  . Anemia   . Anxiety   . Back pain   . Chronic pain 02/02/2011  . Depression   . Gout   . Hyperlipidemia   . Hypertension    Review of Systems is complete and negative except as noted.  Objective   BP 132/84 (BP Location: Left Upper Arm, Patient Position: Sitting)   Pulse 70   Temp 97.4 F (36.3 C) (Oral)   Resp 16   Ht 5' 1 (1.549 m)   Wt 158 lb 3.2 oz (71.8 kg)   SpO2 94%   BMI 29.89 kg/m  General:  Well developed, Well nourished, No distress. Frail HEENT: Normocephalic, atraumatic;  Neck:  Supple No lymphadenopathy  CV:  S1S2, Regular rate and rhythm, without murmur, gallops or rubs Lungs: Diminished bibasilar breath sounds Skin:  eccymoses right thorax Extremities:  Symmetric tight pedal edema to mid thigh. Negative homan's   Neuro:  No focal deficits; Cranial nerves II-XII intact     Impression   1. Acute on chronic diastolic CHF (congestive heart failure) (*)   2. Stage 4 chronic kidney disease (*)     Plan  Check labs Discuss need to have phone on and go to ED if critical abnormal Increase torsemide  to 100mg  daily MCM/call with condition tomorrow ED if not significantly better within 2 days See me next Tuesday for follow up   Patient's Medications       * Accurate as of November 03, 2023  5:56 PM. Reflects encounter med changes as of last refresh          Continued Medications      Instructions  acetaminophen  500 mg tablet Commonly known as:  TYLENOL   1,000 mg   albuterol  sulfate HFA 108 (90 Base) MCG/ACT inhaler Commonly known as: PROVENTIL ,VENTOLIN ,PROAIR   2 puffs, Inhalation, Every 6 hours as needed respiratory   amiodarone  200 mg tablet Commonly known as: PACERONE   200 mg, Oral, Daily   apixaban  5 mg tablet Commonly known as: ELIQUIS   TAKE 1 TABLET BY MOUTH TWICE DAILY   busPIRone  15 MG tablet Commonly known as: BUSPAR   TAKE 1 TABLET(15 MG) BY MOUTH TWICE DAILY   Cholecalciferol  50 MCG (2000 UT) Tabs  4,000 Units, Daily   diclofenac sodium 1 % gel Commonly known as: VOLTAREN  Topical, 4 times a day as needed   ferrous sulfate  325 (65 FE) MG tablet  325 mg, Oral, 2 times a day   fluticasone  propionate 50 mcg/actuation nasal spray Commonly known as: FLONASE   2 sprays, Daily   furosemide  20 mg tablet Commonly known as: LASIX   TAKE 1 TABLET(20 MG) BY MOUTH DAILY   gabapentin  600 mg tablet Commonly known as: NEURONTIN   TAKE 1 TABLET(600 MG) BY MOUTH FOUR TIMES DAILY   hydrALAzine  HCl 25 mg tablet Commonly known as: APRESOLINE   25 mg, Oral, 3 times a day   hydrOXYzine HCl 25 mg tablet  Commonly known as: ATARAX  25 mg, Oral, Every 6 hours as needed, Take 1/2 to 1 tablet by mouth every 6 (six) hours as needed for anxiety   ipratropium-albuterol  20-100 MCG/ACT inhaler Commonly known as: COMBIVENT  RESPIMAT  1 puff   isosorbide  dinitrate 10 mg tablet Commonly known as: ISORDIL   10 mg, Oral, 3 times a day   loratadine  10 MG tablet Commonly known as: CLARITIN   10 mg, Daily   * Misc. Devices Misc  O2 at 2L/min   * Misc. Devices Misc  Oxygen concentrator dx. Hypoxemia, oxygen dependent   * Misc. Devices Misc  Oxygen supplies (mask and tubing)  Dx ocygen dependent, hypoxemia   * Misc. Devices Misc  Humidifier for oxygen concentrator   * Misc. Devices Misc  Rollator dx. Gait instability, cannot walk more than 10 feet, fall risk, oxygen dependent   * Misc. Devices Misc  Oxygen Concentrator   Oxygen Supplies Mask and Tubing  Oxygen Tanks   Pox at rest with Oxygen 95 % POX without Oxygen sitting 90 % Pox without Oxygen walking  83 % Pox walking with Oxygen 95 %   naloxone  nasal spray (TAKE HOME PACK) Commonly known as: NARCAN   1 spray, Nasal, Once as needed   nicotine  21 mg/24 hours Commonly known as: HABITROL ,NICODERM CQ   21 mg, Daily   NITROSTAT  0.4 MG SL tablet Generic drug: nitroGLYCERIN   No dose, route, or frequency recorded.   nystatin 100000 UNIT/ML suspension Commonly known as: MYCOSTATIN  Please mix RX: 30ml nystatin 100,000U suspension. 60mg  hydrocortisone. QS with diphenhydramine  12.5mg /37ml. Use 5mL every 6 hours as needed   nystatin cream Commonly known as: MYCOSTATIN  APPLY TOPICALLY TO THE AFFECTED AREA TWICE DAILY   pantoprazole  sodium 40 mg tablet Commonly known as: PROTONIX   40 mg, Daily   potassium chloride  20 mEq CR tablet Commonly known as: K-DUR,KLOR-CON   20 mEq, Oral, Daily   Potassium Chloride  ER 20 MEQ Tbcr  20 mEq, Daily   rosuvastatin  calcium  40 mg tablet Commonly known as: CRESTOR   40 mg, Daily   venlafaxine  HCl 150 mg 24 hr capsule Commonly known as: EFFEXOR -XR  TAKE 1 CAPSULE(150 MG) BY MOUTH DAILY   Vitamins/Minerals Tabs  Take by mouth.      * * This list has 6 medication(s) that are the same as other medications prescribed for you. Read the directions carefully, and ask your doctor or other care provider to review them with you.          Modified Medications      Instructions  torsemide  100 mg tablet Commonly known as: DEMADEX  What changed:  medication strength how much to take when to take this Changed by: Robert Beam  100 mg, Oral, Daily        Orders Placed This Encounter  Procedures  . CBC And Differential  . Comprehensive Metabolic Panel    Risks, benefits, and alternatives of the medications and treatment plan prescribed today were discussed, and patient expressed understanding. Plan follow-up as  discussed or as needed if any worsening symptoms or change in condition.             [1] Social History Tobacco Use  . Smoking status: Every Day    Current packs/day: 1.00    Types: Cigarettes  . Smokeless tobacco: Never  Vaping Use  . Vaping status: Never Used  Substance Use Topics  . Alcohol use: No  . Drug use: No  *Some images could not be shown.

## 2023-11-04 ENCOUNTER — Telehealth (HOSPITAL_COMMUNITY): Payer: Self-pay | Admitting: Pharmacy Technician

## 2023-11-04 ENCOUNTER — Encounter (HOSPITAL_COMMUNITY): Payer: Self-pay

## 2023-11-04 ENCOUNTER — Emergency Department (HOSPITAL_COMMUNITY)

## 2023-11-04 ENCOUNTER — Other Ambulatory Visit: Payer: Self-pay

## 2023-11-04 ENCOUNTER — Inpatient Hospital Stay (HOSPITAL_COMMUNITY)

## 2023-11-04 ENCOUNTER — Other Ambulatory Visit (HOSPITAL_COMMUNITY): Payer: Self-pay

## 2023-11-04 DIAGNOSIS — F17210 Nicotine dependence, cigarettes, uncomplicated: Secondary | ICD-10-CM | POA: Diagnosis not present

## 2023-11-04 DIAGNOSIS — Y848 Other medical procedures as the cause of abnormal reaction of the patient, or of later complication, without mention of misadventure at the time of the procedure: Secondary | ICD-10-CM | POA: Diagnosis not present

## 2023-11-04 DIAGNOSIS — B9562 Methicillin resistant Staphylococcus aureus infection as the cause of diseases classified elsewhere: Secondary | ICD-10-CM

## 2023-11-04 DIAGNOSIS — Z6831 Body mass index (BMI) 31.0-31.9, adult: Secondary | ICD-10-CM | POA: Diagnosis not present

## 2023-11-04 DIAGNOSIS — R6881 Early satiety: Secondary | ICD-10-CM | POA: Diagnosis present

## 2023-11-04 DIAGNOSIS — I361 Nonrheumatic tricuspid (valve) insufficiency: Secondary | ICD-10-CM | POA: Diagnosis not present

## 2023-11-04 DIAGNOSIS — T80211A Bloodstream infection due to central venous catheter, initial encounter: Secondary | ICD-10-CM | POA: Diagnosis not present

## 2023-11-04 DIAGNOSIS — S2241XA Multiple fractures of ribs, right side, initial encounter for closed fracture: Secondary | ICD-10-CM | POA: Diagnosis present

## 2023-11-04 DIAGNOSIS — J9621 Acute and chronic respiratory failure with hypoxia: Secondary | ICD-10-CM | POA: Diagnosis present

## 2023-11-04 DIAGNOSIS — I5031 Acute diastolic (congestive) heart failure: Secondary | ICD-10-CM | POA: Diagnosis not present

## 2023-11-04 DIAGNOSIS — Z888 Allergy status to other drugs, medicaments and biological substances status: Secondary | ICD-10-CM

## 2023-11-04 DIAGNOSIS — E1165 Type 2 diabetes mellitus with hyperglycemia: Secondary | ICD-10-CM | POA: Diagnosis present

## 2023-11-04 DIAGNOSIS — E873 Alkalosis: Secondary | ICD-10-CM | POA: Diagnosis not present

## 2023-11-04 DIAGNOSIS — I509 Heart failure, unspecified: Principal | ICD-10-CM

## 2023-11-04 DIAGNOSIS — F32A Depression, unspecified: Secondary | ICD-10-CM | POA: Diagnosis present

## 2023-11-04 DIAGNOSIS — Z9981 Dependence on supplemental oxygen: Secondary | ICD-10-CM

## 2023-11-04 DIAGNOSIS — I10 Essential (primary) hypertension: Secondary | ICD-10-CM | POA: Diagnosis not present

## 2023-11-04 DIAGNOSIS — R7989 Other specified abnormal findings of blood chemistry: Secondary | ICD-10-CM | POA: Diagnosis present

## 2023-11-04 DIAGNOSIS — J449 Chronic obstructive pulmonary disease, unspecified: Secondary | ICD-10-CM | POA: Diagnosis present

## 2023-11-04 DIAGNOSIS — E1141 Type 2 diabetes mellitus with diabetic mononeuropathy: Secondary | ICD-10-CM | POA: Diagnosis present

## 2023-11-04 DIAGNOSIS — F419 Anxiety disorder, unspecified: Secondary | ICD-10-CM | POA: Diagnosis present

## 2023-11-04 DIAGNOSIS — I340 Nonrheumatic mitral (valve) insufficiency: Secondary | ICD-10-CM | POA: Diagnosis not present

## 2023-11-04 DIAGNOSIS — E876 Hypokalemia: Secondary | ICD-10-CM | POA: Diagnosis not present

## 2023-11-04 DIAGNOSIS — Z66 Do not resuscitate: Secondary | ICD-10-CM | POA: Diagnosis present

## 2023-11-04 DIAGNOSIS — R7881 Bacteremia: Secondary | ICD-10-CM | POA: Diagnosis not present

## 2023-11-04 DIAGNOSIS — N170 Acute kidney failure with tubular necrosis: Secondary | ICD-10-CM | POA: Diagnosis present

## 2023-11-04 DIAGNOSIS — Z955 Presence of coronary angioplasty implant and graft: Secondary | ICD-10-CM

## 2023-11-04 DIAGNOSIS — I480 Paroxysmal atrial fibrillation: Secondary | ICD-10-CM | POA: Diagnosis present

## 2023-11-04 DIAGNOSIS — I5033 Acute on chronic diastolic (congestive) heart failure: Secondary | ICD-10-CM | POA: Diagnosis present

## 2023-11-04 DIAGNOSIS — Z515 Encounter for palliative care: Secondary | ICD-10-CM

## 2023-11-04 DIAGNOSIS — J9622 Acute and chronic respiratory failure with hypercapnia: Secondary | ICD-10-CM | POA: Diagnosis present

## 2023-11-04 DIAGNOSIS — Z7901 Long term (current) use of anticoagulants: Secondary | ICD-10-CM

## 2023-11-04 DIAGNOSIS — Z981 Arthrodesis status: Secondary | ICD-10-CM

## 2023-11-04 DIAGNOSIS — R21 Rash and other nonspecific skin eruption: Secondary | ICD-10-CM | POA: Diagnosis not present

## 2023-11-04 DIAGNOSIS — N179 Acute kidney failure, unspecified: Secondary | ICD-10-CM | POA: Diagnosis not present

## 2023-11-04 DIAGNOSIS — E1122 Type 2 diabetes mellitus with diabetic chronic kidney disease: Secondary | ICD-10-CM | POA: Diagnosis present

## 2023-11-04 DIAGNOSIS — A4102 Sepsis due to Methicillin resistant Staphylococcus aureus: Secondary | ICD-10-CM | POA: Diagnosis not present

## 2023-11-04 DIAGNOSIS — J9601 Acute respiratory failure with hypoxia: Secondary | ICD-10-CM | POA: Diagnosis not present

## 2023-11-04 DIAGNOSIS — R011 Cardiac murmur, unspecified: Secondary | ICD-10-CM | POA: Diagnosis present

## 2023-11-04 DIAGNOSIS — M109 Gout, unspecified: Secondary | ICD-10-CM | POA: Diagnosis present

## 2023-11-04 DIAGNOSIS — I130 Hypertensive heart and chronic kidney disease with heart failure and stage 1 through stage 4 chronic kidney disease, or unspecified chronic kidney disease: Secondary | ICD-10-CM | POA: Diagnosis present

## 2023-11-04 DIAGNOSIS — G928 Other toxic encephalopathy: Secondary | ICD-10-CM | POA: Diagnosis present

## 2023-11-04 DIAGNOSIS — R7401 Elevation of levels of liver transaminase levels: Secondary | ICD-10-CM | POA: Diagnosis present

## 2023-11-04 DIAGNOSIS — Z85118 Personal history of other malignant neoplasm of bronchus and lung: Secondary | ICD-10-CM

## 2023-11-04 DIAGNOSIS — E119 Type 2 diabetes mellitus without complications: Secondary | ICD-10-CM | POA: Diagnosis not present

## 2023-11-04 DIAGNOSIS — T502X5A Adverse effect of carbonic-anhydrase inhibitors, benzothiadiazides and other diuretics, initial encounter: Secondary | ICD-10-CM | POA: Diagnosis not present

## 2023-11-04 DIAGNOSIS — E874 Mixed disorder of acid-base balance: Secondary | ICD-10-CM | POA: Diagnosis present

## 2023-11-04 DIAGNOSIS — L89896 Pressure-induced deep tissue damage of other site: Secondary | ICD-10-CM | POA: Diagnosis present

## 2023-11-04 DIAGNOSIS — M1990 Unspecified osteoarthritis, unspecified site: Secondary | ICD-10-CM | POA: Diagnosis present

## 2023-11-04 DIAGNOSIS — E66811 Obesity, class 1: Secondary | ICD-10-CM | POA: Diagnosis present

## 2023-11-04 DIAGNOSIS — J9602 Acute respiratory failure with hypercapnia: Secondary | ICD-10-CM | POA: Diagnosis not present

## 2023-11-04 DIAGNOSIS — D509 Iron deficiency anemia, unspecified: Secondary | ICD-10-CM | POA: Diagnosis present

## 2023-11-04 DIAGNOSIS — I5021 Acute systolic (congestive) heart failure: Secondary | ICD-10-CM

## 2023-11-04 DIAGNOSIS — D631 Anemia in chronic kidney disease: Secondary | ICD-10-CM | POA: Diagnosis present

## 2023-11-04 DIAGNOSIS — Z5941 Food insecurity: Secondary | ICD-10-CM

## 2023-11-04 DIAGNOSIS — N184 Chronic kidney disease, stage 4 (severe): Secondary | ICD-10-CM | POA: Diagnosis present

## 2023-11-04 DIAGNOSIS — I219 Acute myocardial infarction, unspecified: Secondary | ICD-10-CM | POA: Diagnosis not present

## 2023-11-04 DIAGNOSIS — G9341 Metabolic encephalopathy: Secondary | ICD-10-CM | POA: Diagnosis not present

## 2023-11-04 DIAGNOSIS — I214 Non-ST elevation (NSTEMI) myocardial infarction: Secondary | ICD-10-CM | POA: Diagnosis present

## 2023-11-04 DIAGNOSIS — J9611 Chronic respiratory failure with hypoxia: Secondary | ICD-10-CM | POA: Diagnosis not present

## 2023-11-04 DIAGNOSIS — N189 Chronic kidney disease, unspecified: Secondary | ICD-10-CM | POA: Diagnosis present

## 2023-11-04 DIAGNOSIS — G8929 Other chronic pain: Secondary | ICD-10-CM | POA: Diagnosis present

## 2023-11-04 DIAGNOSIS — R9431 Abnormal electrocardiogram [ECG] [EKG]: Secondary | ICD-10-CM | POA: Diagnosis not present

## 2023-11-04 DIAGNOSIS — R570 Cardiogenic shock: Secondary | ICD-10-CM | POA: Diagnosis not present

## 2023-11-04 DIAGNOSIS — I4891 Unspecified atrial fibrillation: Secondary | ICD-10-CM | POA: Diagnosis present

## 2023-11-04 DIAGNOSIS — D649 Anemia, unspecified: Secondary | ICD-10-CM | POA: Diagnosis not present

## 2023-11-04 DIAGNOSIS — Z90710 Acquired absence of both cervix and uterus: Secondary | ICD-10-CM

## 2023-11-04 DIAGNOSIS — E785 Hyperlipidemia, unspecified: Secondary | ICD-10-CM | POA: Diagnosis present

## 2023-11-04 DIAGNOSIS — R0602 Shortness of breath: Secondary | ICD-10-CM | POA: Diagnosis present

## 2023-11-04 DIAGNOSIS — E039 Hypothyroidism, unspecified: Secondary | ICD-10-CM | POA: Diagnosis present

## 2023-11-04 DIAGNOSIS — W182XXA Fall in (into) shower or empty bathtub, initial encounter: Secondary | ICD-10-CM | POA: Diagnosis present

## 2023-11-04 DIAGNOSIS — Y93E1 Activity, personal bathing and showering: Secondary | ICD-10-CM

## 2023-11-04 DIAGNOSIS — Z79899 Other long term (current) drug therapy: Secondary | ICD-10-CM

## 2023-11-04 DIAGNOSIS — D6869 Other thrombophilia: Secondary | ICD-10-CM | POA: Diagnosis not present

## 2023-11-04 DIAGNOSIS — I5043 Acute on chronic combined systolic (congestive) and diastolic (congestive) heart failure: Secondary | ICD-10-CM | POA: Diagnosis present

## 2023-11-04 DIAGNOSIS — I2510 Atherosclerotic heart disease of native coronary artery without angina pectoris: Secondary | ICD-10-CM | POA: Diagnosis not present

## 2023-11-04 DIAGNOSIS — Z7189 Other specified counseling: Secondary | ICD-10-CM | POA: Diagnosis not present

## 2023-11-04 DIAGNOSIS — Z8679 Personal history of other diseases of the circulatory system: Secondary | ICD-10-CM

## 2023-11-04 DIAGNOSIS — S20229A Contusion of unspecified back wall of thorax, initial encounter: Secondary | ICD-10-CM | POA: Diagnosis present

## 2023-11-04 DIAGNOSIS — D508 Other iron deficiency anemias: Secondary | ICD-10-CM | POA: Diagnosis not present

## 2023-11-04 DIAGNOSIS — R296 Repeated falls: Secondary | ICD-10-CM | POA: Diagnosis present

## 2023-11-04 DIAGNOSIS — I5082 Biventricular heart failure: Secondary | ICD-10-CM | POA: Diagnosis present

## 2023-11-04 DIAGNOSIS — Z8249 Family history of ischemic heart disease and other diseases of the circulatory system: Secondary | ICD-10-CM

## 2023-11-04 DIAGNOSIS — M549 Dorsalgia, unspecified: Secondary | ICD-10-CM | POA: Diagnosis present

## 2023-11-04 DIAGNOSIS — E875 Hyperkalemia: Secondary | ICD-10-CM | POA: Diagnosis present

## 2023-11-04 DIAGNOSIS — Z555 Less than a high school diploma: Secondary | ICD-10-CM

## 2023-11-04 DIAGNOSIS — Z885 Allergy status to narcotic agent status: Secondary | ICD-10-CM

## 2023-11-04 LAB — BASIC METABOLIC PANEL WITH GFR
Anion gap: 14 (ref 5–15)
BUN: 53 mg/dL — ABNORMAL HIGH (ref 8–23)
CO2: 33 mmol/L — ABNORMAL HIGH (ref 22–32)
Calcium: 9 mg/dL (ref 8.9–10.3)
Chloride: 88 mmol/L — ABNORMAL LOW (ref 98–111)
Creatinine, Ser: 2.26 mg/dL — ABNORMAL HIGH (ref 0.44–1.00)
GFR, Estimated: 23 mL/min — ABNORMAL LOW (ref >60)
Glucose, Bld: 169 mg/dL — ABNORMAL HIGH (ref 70–99)
Potassium: 5 mmol/L (ref 3.5–5.1)
Sodium: 135 mmol/L (ref 135–145)

## 2023-11-04 LAB — I-STAT VENOUS BLOOD GAS, ED
Acid-Base Excess: 12 mmol/L — ABNORMAL HIGH (ref 0.0–2.0)
Bicarbonate: 38.3 mmol/L — ABNORMAL HIGH (ref 20.0–28.0)
Calcium, Ion: 1.09 mmol/L — ABNORMAL LOW (ref 1.15–1.40)
HCT: 30 % — ABNORMAL LOW (ref 36.0–46.0)
Hemoglobin: 10.2 g/dL — ABNORMAL LOW (ref 12.0–15.0)
O2 Saturation: 71 %
Potassium: 4.9 mmol/L (ref 3.5–5.1)
Sodium: 133 mmol/L — ABNORMAL LOW (ref 135–145)
TCO2: 40 mmol/L — ABNORMAL HIGH (ref 22–32)
pCO2, Ven: 61.7 mmHg — ABNORMAL HIGH (ref 44–60)
pH, Ven: 7.401 (ref 7.25–7.43)
pO2, Ven: 39 mmHg (ref 32–45)

## 2023-11-04 LAB — HEPATIC FUNCTION PANEL
ALT: 309 U/L — ABNORMAL HIGH (ref 0–44)
AST: 241 U/L — ABNORMAL HIGH (ref 15–41)
Albumin: 2.7 g/dL — ABNORMAL LOW (ref 3.5–5.0)
Alkaline Phosphatase: 159 U/L — ABNORMAL HIGH (ref 38–126)
Bilirubin, Direct: 0.1 mg/dL (ref 0.0–0.2)
Indirect Bilirubin: 0.6 mg/dL (ref 0.3–0.9)
Total Bilirubin: 0.7 mg/dL (ref 0.0–1.2)
Total Protein: 6.4 g/dL — ABNORMAL LOW (ref 6.5–8.1)

## 2023-11-04 LAB — ECHOCARDIOGRAM COMPLETE
Area-P 1/2: 2.76 cm2
Calc EF: 33.1 %
Height: 59 in
S' Lateral: 5.1 cm
Single Plane A2C EF: 38.3 %
Single Plane A4C EF: 27.2 %
Weight: 2486.79 [oz_av]

## 2023-11-04 LAB — CBC
HCT: 29.5 % — ABNORMAL LOW (ref 36.0–46.0)
Hemoglobin: 8.8 g/dL — ABNORMAL LOW (ref 12.0–15.0)
MCH: 26.3 pg (ref 26.0–34.0)
MCHC: 29.8 g/dL — ABNORMAL LOW (ref 30.0–36.0)
MCV: 88.3 fL (ref 80.0–100.0)
Platelets: 266 K/uL (ref 150–400)
RBC: 3.34 MIL/uL — ABNORMAL LOW (ref 3.87–5.11)
RDW: 17.1 % — ABNORMAL HIGH (ref 11.5–15.5)
WBC: 11 K/uL — ABNORMAL HIGH (ref 4.0–10.5)
nRBC: 0.3 % — ABNORMAL HIGH (ref 0.0–0.2)

## 2023-11-04 LAB — GLUCOSE, CAPILLARY: Glucose-Capillary: 199 mg/dL — ABNORMAL HIGH (ref 70–99)

## 2023-11-04 LAB — TROPONIN I (HIGH SENSITIVITY)
Troponin I (High Sensitivity): 664 ng/L (ref <18)
Troponin I (High Sensitivity): 758 ng/L (ref <18)
Troponin I (High Sensitivity): 956 ng/L (ref <18)
Troponin I (High Sensitivity): 1139 ng/L (ref <18)

## 2023-11-04 LAB — APTT: aPTT: 49 s — ABNORMAL HIGH (ref 24–36)

## 2023-11-04 LAB — HEPARIN LEVEL (UNFRACTIONATED): Heparin Unfractionated: >1.1 [IU]/mL — ABNORMAL HIGH (ref 0.30–0.70)

## 2023-11-04 LAB — BRAIN NATRIURETIC PEPTIDE: B Natriuretic Peptide: 1970.1 pg/mL — ABNORMAL HIGH (ref 0.0–100.0)

## 2023-11-04 LAB — LACTIC ACID, PLASMA: Lactic Acid, Venous: 1.3 mmol/L (ref 0.5–1.9)

## 2023-11-04 LAB — MAGNESIUM: Magnesium: 2.7 mg/dL — ABNORMAL HIGH (ref 1.7–2.4)

## 2023-11-04 MED ORDER — HEPARIN (PORCINE) 25000 UT/250ML-% IV SOLN
1250.0000 [IU]/h | INTRAVENOUS | Status: DC
  Administered 2023-11-04: 850 [IU]/h via INTRAVENOUS
  Administered 2023-11-05 – 2023-11-06 (×2): 1150 [IU]/h via INTRAVENOUS
  Administered 2023-11-07 – 2023-11-08 (×3): 1250 [IU]/h via INTRAVENOUS
  Filled 2023-11-04 (×5): qty 250

## 2023-11-04 MED ORDER — ACETAMINOPHEN 325 MG PO TABS
650.0000 mg | ORAL_TABLET | Freq: Four times a day (QID) | ORAL | Status: DC | PRN
  Administered 2023-11-04 – 2023-11-13 (×8): 650 mg via ORAL
  Filled 2023-11-04 (×5): qty 2

## 2023-11-04 MED ORDER — ACETAMINOPHEN 650 MG RE SUPP: 650.0000 mg | Freq: Four times a day (QID) | RECTAL | Status: DC | PRN

## 2023-11-04 MED ORDER — MELATONIN 3 MG PO TABS
3.0000 mg | ORAL_TABLET | Freq: Every evening | ORAL | Status: DC | PRN
  Administered 2023-11-09 (×2): 3 mg via ORAL
  Filled 2023-11-04 (×2): qty 1

## 2023-11-04 MED ORDER — HYDROCODONE-ACETAMINOPHEN 5-325 MG PO TABS
1.0000 | ORAL_TABLET | Freq: Four times a day (QID) | ORAL | Status: DC | PRN
  Administered 2023-11-04 – 2023-11-08 (×11): 1 via ORAL
  Filled 2023-11-04 (×10): qty 1

## 2023-11-04 MED ORDER — ISOSORBIDE DINITRATE 10 MG PO TABS
10.0000 mg | ORAL_TABLET | Freq: Three times a day (TID) | ORAL | Status: DC
Start: 2023-11-04 — End: 2023-11-13
  Administered 2023-11-04 – 2023-11-12 (×36): 10 mg via ORAL
  Filled 2023-11-04 (×28): qty 1

## 2023-11-04 MED ORDER — AMIODARONE HCL 200 MG PO TABS
200.0000 mg | ORAL_TABLET | Freq: Every day | ORAL | Status: DC
  Administered 2023-11-04 – 2023-11-08 (×6): 200 mg via ORAL
  Filled 2023-11-04 (×5): qty 1

## 2023-11-04 MED ORDER — VENLAFAXINE HCL ER 75 MG PO CP24
150.0000 mg | ORAL_CAPSULE | Freq: Every day | ORAL | Status: DC
  Administered 2023-11-04 – 2023-11-10 (×10): 150 mg via ORAL
  Filled 2023-11-04 (×7): qty 2

## 2023-11-04 MED ORDER — FUROSEMIDE 10 MG/ML IJ SOLN
80.0000 mg | Freq: Once | INTRAMUSCULAR | Status: AC
Start: 2023-11-04 — End: 2023-11-04
  Administered 2023-11-04: 80 mg via INTRAVENOUS
  Filled 2023-11-04: qty 8

## 2023-11-04 MED ORDER — BUSPIRONE HCL 5 MG PO TABS
15.0000 mg | ORAL_TABLET | Freq: Two times a day (BID) | ORAL | Status: DC
  Administered 2023-11-04 – 2023-11-10 (×18): 15 mg via ORAL
  Filled 2023-11-04 (×13): qty 1

## 2023-11-04 MED ORDER — ROSUVASTATIN CALCIUM 20 MG PO TABS
10.0000 mg | ORAL_TABLET | Freq: Every day | ORAL | Status: DC
  Administered 2023-11-04: 10 mg via ORAL
  Filled 2023-11-04: qty 1

## 2023-11-04 MED ORDER — SODIUM CHLORIDE 0.9% FLUSH
3.0000 mL | Freq: Two times a day (BID) | INTRAVENOUS | Status: DC
  Administered 2023-11-04 – 2023-11-14 (×20): 3 mL via INTRAVENOUS

## 2023-11-04 MED ORDER — PERFLUTREN LIPID MICROSPHERE
1.0000 mL | INTRAVENOUS | Status: AC | PRN
  Administered 2023-11-04: 4 mL via INTRAVENOUS

## 2023-11-04 MED ORDER — HYDRALAZINE HCL 25 MG PO TABS
25.0000 mg | ORAL_TABLET | Freq: Three times a day (TID) | ORAL | Status: DC
  Administered 2023-11-04 – 2023-11-12 (×36): 25 mg via ORAL
  Filled 2023-11-04 (×28): qty 1

## 2023-11-04 MED ORDER — FUROSEMIDE 10 MG/ML IJ SOLN
40.0000 mg | Freq: Once | INTRAMUSCULAR | Status: AC
  Administered 2023-11-04: 40 mg via INTRAVENOUS
  Filled 2023-11-04: qty 4

## 2023-11-04 MED ORDER — FUROSEMIDE 10 MG/ML IJ SOLN
80.0000 mg | Freq: Once | INTRAMUSCULAR | Status: AC
  Administered 2023-11-04: 80 mg via INTRAVENOUS
  Filled 2023-11-04: qty 8

## 2023-11-04 MED ORDER — LIDOCAINE 5 % EX PTCH
1.0000 | MEDICATED_PATCH | CUTANEOUS | Status: DC
  Administered 2023-11-04 – 2023-11-13 (×13): 1 via TRANSDERMAL
  Filled 2023-11-04 (×10): qty 1

## 2023-11-04 MED ORDER — FUROSEMIDE 10 MG/ML IJ SOLN: 80.0000 mg | Freq: Two times a day (BID) | INTRAMUSCULAR | Status: DC

## 2023-11-04 MED ORDER — FERROUS SULFATE 325 (65 FE) MG PO TABS
325.0000 mg | ORAL_TABLET | Freq: Every day | ORAL | Status: DC
  Administered 2023-11-04 – 2023-11-13 (×13): 325 mg via ORAL
  Filled 2023-11-04 (×10): qty 1

## 2023-11-04 MED ORDER — ALBUTEROL SULFATE (2.5 MG/3ML) 0.083% IN NEBU: 2.5000 mg | INHALATION_SOLUTION | RESPIRATORY_TRACT | Status: DC | PRN

## 2023-11-04 MED ORDER — METOLAZONE 2.5 MG PO TABS
2.5000 mg | ORAL_TABLET | Freq: Once | ORAL | Status: AC
  Administered 2023-11-04: 2.5 mg via ORAL
  Filled 2023-11-04: qty 1

## 2023-11-04 MED ORDER — SODIUM CHLORIDE 0.9% FLUSH
3.0000 mL | Freq: Once | INTRAVENOUS | Status: AC
  Administered 2023-11-04: 3 mL via INTRAVENOUS

## 2023-11-04 MED ORDER — NICOTINE 21 MG/24HR TD PT24
21.0000 mg | MEDICATED_PATCH | Freq: Every day | TRANSDERMAL | Status: DC
  Administered 2023-11-04 – 2023-11-13 (×13): 21 mg via TRANSDERMAL
  Filled 2023-11-04 (×10): qty 1

## 2023-11-04 MED ORDER — INSULIN ASPART 100 UNIT/ML IJ SOLN
0.0000 [IU] | Freq: Three times a day (TID) | INTRAMUSCULAR | Status: DC
  Administered 2023-11-05: 2 [IU] via SUBCUTANEOUS
  Administered 2023-11-05: 3 [IU] via SUBCUTANEOUS
  Administered 2023-11-05 – 2023-11-06 (×2): 1 [IU] via SUBCUTANEOUS
  Administered 2023-11-06: 3 [IU] via SUBCUTANEOUS
  Administered 2023-11-06: 2 [IU] via SUBCUTANEOUS
  Administered 2023-11-07: 3 [IU] via SUBCUTANEOUS
  Administered 2023-11-07: 2 [IU] via SUBCUTANEOUS
  Administered 2023-11-07: 1 [IU] via SUBCUTANEOUS
  Administered 2023-11-08 (×2): 2 [IU] via SUBCUTANEOUS
  Administered 2023-11-08: 3 [IU] via SUBCUTANEOUS
  Administered 2023-11-08: 2 [IU] via SUBCUTANEOUS
  Administered 2023-11-08: 3 [IU] via SUBCUTANEOUS
  Administered 2023-11-08 – 2023-11-09 (×2): 2 [IU] via SUBCUTANEOUS
  Administered 2023-11-09 (×2): 1 [IU] via SUBCUTANEOUS
  Administered 2023-11-09 (×3): 2 [IU] via SUBCUTANEOUS
  Administered 2023-11-10: 1 [IU] via SUBCUTANEOUS
  Administered 2023-11-10: 3 [IU] via SUBCUTANEOUS
  Administered 2023-11-10: 1 [IU] via SUBCUTANEOUS
  Administered 2023-11-10 (×3): 3 [IU] via SUBCUTANEOUS
  Administered 2023-11-11 – 2023-11-13 (×5): 1 [IU] via SUBCUTANEOUS
  Administered 2023-11-13: 2 [IU] via SUBCUTANEOUS
  Administered 2023-11-14: 1 [IU] via SUBCUTANEOUS

## 2023-11-04 MED ORDER — ONDANSETRON HCL 4 MG/2ML IJ SOLN: 4.0000 mg | Freq: Four times a day (QID) | INTRAMUSCULAR | Status: DC | PRN

## 2023-11-04 NOTE — ED Notes (Signed)
 3E notified of patient coming up.

## 2023-11-04 NOTE — Progress Notes (Signed)
 Echocardiogram 2D Echocardiogram has been performed.  Damien FALCON Alexes Menchaca RDCS 11/04/2023, 2:54 PM

## 2023-11-04 NOTE — Plan of Care (Signed)
  Problem: Education: Goal: Knowledge of General Education information will improve Description: Including pain rating scale, medication(s)/side effects and non-pharmacologic comfort measures Outcome: Progressing   Problem: Clinical Measurements: Goal: Ability to maintain clinical measurements within normal limits will improve Outcome: Progressing   Problem: Clinical Measurements: Goal: Will remain free from infection Outcome: Progressing   Problem: Clinical Measurements: Goal: Diagnostic test results will improve Outcome: Progressing   Problem: Elimination: Goal: Will not experience complications related to bowel motility Outcome: Progressing   Problem: Coping: Goal: Level of anxiety will decrease Outcome: Progressing   Problem: Nutrition: Goal: Adequate nutrition will be maintained Outcome: Progressing

## 2023-11-04 NOTE — Progress Notes (Signed)
 PHARMACY - ANTICOAGULATION CONSULT NOTE  Pharmacy Consult for heparin  Indication: NSTEMI  Allergies  Allergen Reactions   Ace Inhibitors Other (See Comments)    Worsening renal function    Codeine Nausea Only    Patient Measurements: Height: 4' 11 (149.9 cm) Weight: 70.5 kg (155 lb 6.8 oz) IBW/kg (Calculated) : 43.2 HEPARIN  DW (KG): 59  Vital Signs: Temp: 98.2 F (36.8 C) (08/07 1610) Temp Source: Oral (08/07 1610) BP: 117/80 (08/07 1610) Pulse Rate: 66 (08/07 1610)  Labs: Recent Labs    11/04/23 0439 11/04/23 0450 11/04/23 0645 11/04/23 1153 11/04/23 1412 11/04/23 1805  HGB 8.8* 10.2*  --   --   --   --   HCT 29.5* 30.0*  --   --   --   --   PLT 266  --   --   --   --   --   APTT  --   --   --   --   --  49*  HEPARINUNFRC  --   --   --   --   --  >1.10*  CREATININE 2.26*  --   --   --   --   --   TROPONINIHS 664*  --  758* 956* 1,139*  --     Estimated Creatinine Clearance: 19.2 mL/min (A) (by C-G formula based on SCr of 2.26 mg/dL (H)).   Medical History: Past Medical History:  Diagnosis Date   Cancer 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   11/03/2023 Morning   albuterol  (VENTOLIN  HFA) 108 (90 Base) MCG/ACT inhaler Inhale 2 puffs into the lungs every 6 (six) hours as needed for shortness of breath or wheezing.   11/03/2023 Noon   amiodarone  (PACERONE ) 200 MG tablet Take 1 tablet (200 mg total) by mouth daily. 90 tablet 3 11/03/2023  Morning   apixaban  (ELIQUIS ) 5 MG TABS tablet Take 1 tablet (5 mg total) by mouth 2 (two) times daily. 180 tablet 3 11/03/2023 at  8:30 PM   Ascorbic Acid (VITAMIN C PO) Take 1 tablet by mouth daily.   11/03/2023 Morning   busPIRone  (BUSPAR ) 15 MG tablet Take 15 mg by mouth 2 (two) times daily.   11/03/2023 Bedtime   Cholecalciferol  (VITAMIN D) 2000 UNITS tablet Take 2,000 Units by mouth daily.   11/03/2023 Morning   gabapentin  (NEURONTIN ) 600 MG tablet Take 600 mg by mouth in the morning, at noon, in the evening, and at bedtime.   11/03/2023 Evening   hydrALAZINE  (APRESOLINE ) 25 MG tablet Take 25 mg by mouth 3 (three) times daily.   11/03/2023 at  9:30 PM   Iron , Ferrous Sulfate , 325 (65 Fe) MG TABS Take 1 tablet by mouth daily. 30 tablet 3 11/03/2023 Morning   isosorbide  dinitrate (ISORDIL ) 10 MG tablet Take 1 tablet (10 mg total)  by mouth 3 (three) times daily. 90 tablet 1 11/03/2023 at  9:30 PM   Misc. Devices MISC Inhale 3 L into the lungs continuous.   11/03/2023   Multiple Vitamin (MULTIVITAMIN WITH MINERALS) TABS tablet Take 1 tablet by mouth daily. 30 tablet 0 11/03/2023 Morning   nicotine  (NICODERM CQ  - DOSED IN MG/24 HOURS) 21 mg/24hr patch Place 1 patch (21 mg total) onto the skin daily. 28 patch 0 11/03/2023 Morning   nitroGLYCERIN  (NITROSTAT ) 0.4 MG SL tablet PLACE 1 TABLET UNDER THE TONGUE EVERY 5 MINUTES AS NEEDED CHEST PAIN. 25 tablet 1 Unknown   potassium chloride  SA (KLOR-CON  M) 20 MEQ tablet Take 20 mEq by mouth daily.   11/03/2023 Morning   rosuvastatin  (CRESTOR ) 40 MG tablet TAKE 1 TABLET BY MOUTH DAILY 90 tablet 3 11/03/2023 Morning   torsemide  (DEMADEX ) 20 MG tablet Take 1 tablet (20 mg total) by mouth daily.   11/03/2023 Morning   venlafaxine  XR (EFFEXOR -XR) 150 MG 24 hr capsule Take 150 mg by mouth daily.  0 11/03/2023 Morning    Assessment: 73y.o. with increased SOB and worsening edema, mildly elevated troponin (335>241). Is on eliquis  PTA, last dose was 8/6 at 2030. Hgb 10.2, Plts 266. Will forgo  bolus and start with infusion since already anticoagulated.  Heparin  level elevated due to apixaban . PTT came back subtherapeutic. We will increase rate and check in AM.   Goal of Therapy:  Heparin  level 0.3-0.7 units/ml aPTT 66-102 seconds Monitor platelets by anticoagulation protocol: Yes   Plan:  Increase heparin  infusion to 1000 units/hr Check aPTT levels until correlating with heparin  level due to prior DOAC use Check AM level Daily heparin , aPTT, and CBC Continue to monitor H&H, plts, and signs and symptoms of bleeding  Sergio Batch, PharmD, BCIDP, AAHIVP, CPP Infectious Disease Pharmacist 11/04/2023 7:15 PM

## 2023-11-04 NOTE — H&P (Signed)
 History and Physical    Patient: Maria Arroyo FMW:[id number] DOB: Oct 30, 1950 DOA: 11/04/2023 DOS: the patient was seen and examined on 11/04/2023 PCP: Aminta Lamar Hamilton, MD  Patient coming from: Home  Chief Complaint:  Chief Complaint  Patient presents with   Shortness of Breath   HPI: Maria Arroyo is a 73 y.o. 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  Since June, she has experienced increased swelling affecting her feet, legs, face, and left hand. Her hydrochlorothiazide  was discontinued during her recent hospitalization, and she was started on 100 mg of torsemide  yesterday. Despite this change, the swelling has worsened.  She normally uses supplemental oxygen and has noticed a worsening in her breathing. She describes a productive cough that she is unable to expel completely. She denies fever or chills but mentions feeling 'real cold'.  Three days ago, she fell in the bathroom, resulting in back pain on the right side with associated bruising. She uses a rollator for mobility and did not lose consciousness during the fall. The pain is worsening, and she has not taken oxycodone  recently due to concerns about stomach upset.  She reports chest pain on the right side and soreness in her neck.   Review of records note patient last hospitalized in 5/14-9/17 after coming in for abnormal labs found to have AKI on CKD stage IV with severe hypokalemia which improved IV fluid hydration.  Secondary to Demadex  and hydrochlorothiazide .  EMS found the patient's  initial oxygen saturation to be in the mid 80s on her baseline 3 L nasal cannula, with ensuing improvement in oxygen saturations into the mid 90s on 5 L nasal cannula.    In the ED patient was noted to have stable vital signs with O2 saturations currently maintained on home 3 L of oxygen.  Labs significant for BNP 1970.1, high-sensitivity troponin 664, WBC 11, hemoglobin 8.8, CO2 33, BUN 53, and creatinine 2.26.  Chest x-ray noted cardiomegaly with mild diffuse interstitial edema, increased moderate right-sided pleural effusion, and patchy perihilar hazy opacity thought to be edema.  Venous blood gas noted pCO2 to be elevated at 61.7.  Patient has been given Lasix  80 mg IV.   Review of Systems: As mentioned in the history of present illness. All other systems reviewed and are negative. 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 11/07/2018   COPD (chronic obstructive pulmonary disease) (HCC)    Coronary artery disease    Depression    Gout    R foot and knee   Hyperlipidemia    Hypertension    Left foot drop    Neuropathy    OA (osteoarthritis)    Pica    Tobacco abuse    Past Surgical History:  Procedure Laterality Date   ABDOMINAL HYSTERECTOMY     ANTERIOR CERVICAL DECOMP/DISCECTOMY FUSION  02/06/2011   Procedure: ANTERIOR CERVICAL DECOMPRESSION/DISCECTOMY FUSION 2 LEVELS;  Surgeon: Rockey LITTIE Peru;  Location: [facility] NEURO ORS;  Service: Neurosurgery;  Laterality: N/A;  Anterior Cervical Four-Five/Five-Six Decompression and Fusion with Plating and Bonegraft   BLADDER SURGERY  BONE BIOPSY  07/04/2023   Procedure: BIOPSY, GASTRIC;  Surgeon: Leigh Elspeth SQUIBB, MD;  Location: [facility] ENDOSCOPY;  Service: Gastroenterology;;   CARDIAC CATHETERIZATION  04/09/2009   EF 60%   CARDIAC CATHETERIZATION  03/24/2001   EF 55%   CARDIAC CATHETERIZATION  01/06/2001   EF 65%   CHOLECYSTECTOMY      COLONOSCOPY N/A 07/04/2023   Procedure: COLONOSCOPY;  Surgeon: Leigh Elspeth SQUIBB, MD;  Location: [facility] ENDOSCOPY;  Service: Gastroenterology;  Laterality: N/A;   CORONARY STENT PLACEMENT  03/2009   STENTING OF THE PROXIMAL TO MID RIGHT CORONARY   ESOPHAGOGASTRODUODENOSCOPY N/A 07/04/2023   Procedure: EGD (ESOPHAGOGASTRODUODENOSCOPY);  Surgeon: Leigh Elspeth SQUIBB, MD;  Location: [facility] ENDOSCOPY;  Service: Gastroenterology;  Laterality: N/A;   GIVENS CAPSULE STUDY N/A 07/04/2023   Procedure: IMAGING PROCEDURE, GI TRACT, INTRALUMINAL, VIA CAPSULE;  Surgeon: Leigh Elspeth SQUIBB, MD;  Location: [facility] ENDOSCOPY;  Service: Gastroenterology;  Laterality: N/A;   IR THORACENTESIS ASP PLEURAL SPACE W/IMG GUIDE  06/01/2018   LEFT HEART CATHETERIZATION WITH CORONARY ANGIOGRAM N/A 12/21/2011   Procedure: LEFT HEART CATHETERIZATION WITH CORONARY ANGIOGRAM;  Surgeon: Peter M Swaziland, MD;  Location: [facility] CATH LAB;  Service: Cardiovascular;  Laterality: N/A;   LOBECTOMY     NODE DISSECTION N/A 04/04/2018   Procedure: NODE DISSECTION;  Surgeon: Kerrin Elspeth BROCKS, MD;  Location: [facility] OR;  Service: Thoracic;  Laterality: N/A;   OTHER SURGICAL HISTORY  12/2014   R foot cyst removal   VIDEO ASSISTED THORACOSCOPY (VATS)/ LOBECTOMY Right 04/04/2018   Procedure: VIDEO ASSISTED THORACOSCOPY (VATS)/RIGHT LOWER LOBECTOMY;  Surgeon: Kerrin Elspeth BROCKS, MD;  Location: [facility] OR;  Service: Thoracic;  Laterality: Right;   Social History:  reports that she has been smoking cigarettes. She has a 70 pack-year smoking history. She has never used smokeless tobacco. She reports that she does not drink alcohol and does not use drugs.  Allergies  Allergen Reactions   Ace Inhibitors Other (See Comments)    Worsening renal function    Codeine Nausea Only    Family History  Problem Relation Age of Onset   Aneurysm Mother    Heart attack Father    Heart failure Father    Stroke Sister    Heart attack Brother     Prior to Admission  medications   Medication Sig Start Date End Date Taking? Authorizing Provider  acetaminophen  (TYLENOL ) 325 MG tablet Take 650 mg by mouth every 4 (four) hours as needed for mild pain (pain score 1-3).   Yes [provider]  albuterol  (VENTOLIN  HFA) 108 (90 Base) MCG/ACT inhaler Inhale 2 puffs into the lungs every 6 (six) hours as needed for shortness of breath or wheezing. 02/17/23  Yes [provider]  amiodarone  (PACERONE ) 200 MG tablet Take 1 tablet (200 mg total) by mouth daily. 12/09/22  Yes Duke, Jon Garre, PA  apixaban  (ELIQUIS ) 5 MG TABS tablet Take 1 tablet (5 mg total) by mouth 2 (two) times daily. 12/09/22  Yes Duke, Jon Garre, PA  Ascorbic Acid (VITAMIN C PO) Take 1 tablet by mouth daily.   Yes [provider]  busPIRone  (BUSPAR ) 15 MG tablet Take 15 mg by mouth 2 (two) times daily.   Yes [provider]  Cholecalciferol  (VITAMIN D) 2000 UNITS tablet Take 2,000 Units by mouth daily.   Yes [provider]  gabapentin  (NEURONTIN ) 600 MG tablet Take 600 mg by mouth in the morning, at noon, in the evening, and at bedtime. 09/23/23  Yes [provider]  hydrALAZINE  (APRESOLINE ) 25 MG tablet Take 25 mg by mouth 3 (three) times daily. 10/18/23  Yes [provider]  Iron , Ferrous Sulfate , 325 (65 Fe) MG TABS Take 1 tablet by mouth daily. 07/06/23  Yes Francella Rogue, MD  isosorbide  dinitrate (ISORDIL ) 10 MG tablet Take 1 tablet (10 mg total) by mouth 3 (three) times daily. 05/16/23  Yes Sebastian Toribio GAILS, MD  Misc. Devices MISC Inhale 3 L into the lungs continuous. 08/30/23  Yes [provider]  Multiple Vitamin (MULTIVITAMIN WITH MINERALS) TABS tablet Take 1 tablet by mouth daily. 08/30/18  Yes Briana Avena D, MD  nicotine  (NICODERM CQ  - DOSED IN MG/24 HOURS) 21 mg/24hr patch Place 1 patch (21 mg total) onto the skin daily. 08/14/23  Yes Singh, Prashant K, MD  nitroGLYCERIN  (NITROSTAT ) 0.4 MG SL tablet PLACE 1 TABLET UNDER THE  TONGUE EVERY 5 MINUTES AS NEEDED CHEST PAIN. 05/25/22  Yes Swaziland, Peter M, MD  potassium chloride  SA (KLOR-CON  M) 20 MEQ tablet Take 20 mEq by mouth daily. 08/30/23  Yes [provider]  rosuvastatin  (CRESTOR ) 40 MG tablet TAKE 1 TABLET BY MOUTH DAILY 12/09/22  Yes Duke, Jon Garre, PA  torsemide  (DEMADEX ) 20 MG tablet Take 1 tablet (20 mg total) by mouth daily. 08/16/23  Yes Singh, Prashant K, MD  venlafaxine  XR (EFFEXOR -XR) 150 MG 24 hr capsule Take 150 mg by mouth daily.   Yes [provider]    Physical Exam: Vitals:   11/04/23 0530 11/04/23 0545 11/04/23 0645 11/04/23 0700  BP: 128/72 (!) 124/91 130/69 124/78  Pulse: 66 66 63   Resp: 16 15 16 18   Temp:      TempSrc:      SpO2: 100% 95% 91%   Weight:      Height:         Constitutional: Elderly female who appears ill but able to follow commands Eyes: PERRL, lids and conjunctivae normal ENMT: Mucous membranes are moist.  Normal dentition.  Neck: normal, supple, JVD present Respiratory: Normal respiratory effort currently on 3 L nasal cannula oxygen with crackles appreciated bilaterally with decreased breath sounds noted in the right lung field. Cardiovascular: Regular rate and rhythm.  At least 2+ pitting bilateral lower extremity edema present. Abdomen: no tenderness, no masses palpated.  Bowel sounds positive.  Musculoskeletal: no clubbing / cyanosis. No joint deformity upper and lower extremities. Good ROM, no contractures. Normal muscle tone.  Skin: no rashes, lesions, ulcers. No induration Neurologic: CN 2-12 grossly intact. Sensation intact, DTR normal. Strength 5/5 in all 4.  Psychiatric: Normal judgment and insight. Alert and oriented x 3. Normal mood.   Data Reviewed:  EKG revealed sinus rhythm at 71 bpm with first-degree heart block and QTc 533.  Reviewed labs, imaging, and pertinent records as documented  Assessment and Plan:  Heart failure with preserved ejection fraction Patient presented with  complaints of progressively worsening shortness of breath and swelling despite increasing torsemide  dose.  During last hospitalization in May patient had been discontinued off of hydrochlorothiazide .  BNP elevated at 1970.1.  Chest x-ray showing cardiomegaly with mild diffuse interstitial edema, increased moderate right-sided pleural effusion, and patchy perihilar hazy opacity thought to be edema.  Patient has been given Lasix  80 mg IV - Admit to a progressive bed - Heart failure order set utilized - Strict I&O's and daily weights - Check echocardiogram -  ive an extra dose 40 mg Lasix  IV now.  Reassess and determine the dose of IV Lasix   needed - Cardiology consulted we will follow-up for any further recommendations  COPD  Acute on chronic chronic respiratory failure with hypoxia and hypercapnia Patient on 3 L of oxygen at baseline.  Venous blood gas noted pH 7.401 with pCO2 61.7. - Continue nasal cannula oxygen to maintain O2 saturations - Does not appear to warrant BiPAP at home.  Elevated troponin CAD s/p DES to RCA x 3 Acute on chronic.  Patient denies any chest pain at this time.  High-sensitivity troponin elevated  664->758.  Possibly secondary to demand in setting of acute CHF exacerbation. - Heparin  drip - Follow-up echocardiogram - Continue isosorbide  dinitrate and  Paroxysmal atrial fibrillation on chronic anticoagulation Patient appears to be in a sinus rhythm - Continue amiodarone   - Held Eliquis  and placed on heparin  drip in the interim in case of need of intervention  Prolonged QT interval Chronic. - Avoid  additional QT prolonging medications.  Essential hypertension Blood pressures currently maintained. - Continue hydralazine  and isosorbide  dinitrate  Controlled diabetes mellitus type 2 On admission glucose noted to be 169.  Last available hemoglobin A1c noted to be 7 when checked 05/13/2023. - Hypoglycemic protocol - CBGs before every meal with sensitive SSI    History of lung cancer Patient with history of squamous cell carcinoma of the right lung s/p right lobectomy back in 2020.  Anemia of chronic disease Hemoglobin noted to be 8.8 which appears around patient. - Continue to monitor  Anxiety and depression - Continue BuSpar  and venlafaxine    Hyperlipidemia - Continue Crestor  with dose reduced by pharmacy due to kidney function  Chronic pain Patient reports having pain in her back, hips, leg - Continue oxycodone  as needed  Tobacco abuse Patient reports still smoking a pack and half cigarettes per day on average. -Continue to counsel need of cessation of tobacco use - Nicotine  patch offered  Obesity class I BMI 31.91 kg/m BMI during previous hospitalization in May had been noted to be 30.12 kg/m   DVT prophylaxis: Heparin  Advance Care Planning:   Code Status: Full Code  Consults: Cardiology Family Communication:   Severity of Illness: The appropriate patient status for this patient is INPATIENT. Inpatient status is judged to be reasonable and necessary in order to provide the required intensity of service to ensure the patient's safety. The patient's presenting symptoms, physical exam findings, and initial radiographic and laboratory data in the context of their chronic comorbidities is felt to place them at high risk for further clinical deterioration. Furthermore, it is not anticipated that the patient will be medically stable for discharge from the hospital within 2 midnights of admission.   * I certify that at the point of admission it is my clinical judgment that the patient will require inpatient hospital care spanning beyond 2 midnights from the point of admission due to high intensity of service, high risk for further deterioration and high frequency of surveillance required.*  Author: Maximino DELENA Sharps, MD 11/04/2023 7:39 AM  For on call review [email or web address].

## 2023-11-04 NOTE — Progress Notes (Addendum)
 PHARMACY - ANTICOAGULATION CONSULT NOTE  Pharmacy Consult for heparin  Indication: NSTEMI  Allergies  Allergen Reactions   Ace Inhibitors Other (See Comments)    Worsening renal function    Codeine Nausea Only    Patient Measurements: Height: 4' 11 (149.9 cm) Weight: 70.5 kg (155 lb 6.8 oz) IBW/kg (Calculated) : 43.2 HEPARIN  DW (KG): 59  Vital Signs: Temp: 98.2 F (36.8 C) (08/07 0757) Temp Source: Oral (08/07 0757) BP: 132/80 (08/07 0757) Pulse Rate: 71 (08/07 0757)  Labs: Recent Labs    11/04/23 0439 11/04/23 0450 11/04/23 0645  HGB 8.8* 10.2*  --   HCT 29.5* 30.0*  --   PLT 266  --   --   CREATININE 2.26*  --   --   TROPONINIHS 664*  --  758*    Estimated Creatinine Clearance: 19.2 mL/min (A) (by C-G formula based on SCr of 2.26 mg/dL (H)).   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   11/03/2023 Morning   albuterol  (VENTOLIN  HFA) 108 (90 Base) MCG/ACT inhaler Inhale 2 puffs into the lungs every 6 (six) hours as needed for shortness of breath or wheezing.   11/03/2023 Noon   amiodarone  (PACERONE ) 200 MG tablet Take 1 tablet (200 mg total) by mouth daily. 90 tablet 3 11/03/2023 Morning   apixaban  (ELIQUIS ) 5 MG TABS tablet Take 1 tablet (5 mg total) by mouth 2 (two) times daily. 180 tablet 3 11/03/2023 at  8:30 PM   Ascorbic Acid (VITAMIN C PO) Take 1 tablet by mouth daily.   11/03/2023  Morning   busPIRone  (BUSPAR ) 15 MG tablet Take 15 mg by mouth 2 (two) times daily.   11/03/2023 Bedtime   Cholecalciferol  (VITAMIN D) 2000 UNITS tablet Take 2,000 Units by mouth daily.   11/03/2023 Morning   gabapentin  (NEURONTIN ) 600 MG tablet Take 600 mg by mouth in the morning, at noon, in the evening, and at bedtime.   11/03/2023 Evening   hydrALAZINE  (APRESOLINE ) 25 MG tablet Take 25 mg by mouth 3 (three) times daily.   11/03/2023 at  9:30 PM   Iron , Ferrous Sulfate , 325 (65 Fe) MG TABS Take 1 tablet by mouth daily. 30 tablet 3 11/03/2023 Morning   isosorbide  dinitrate (ISORDIL ) 10 MG tablet Take 1 tablet (10 mg total) by mouth 3 (three) times daily. 90 tablet 1 11/03/2023 at  9:30 PM   Misc. Devices MISC Inhale 3 L into the lungs continuous.   11/03/2023   Multiple Vitamin (MULTIVITAMIN WITH MINERALS) TABS tablet Take 1 tablet by mouth daily. 30 tablet 0 11/03/2023 Morning   nicotine  (NICODERM CQ  - DOSED IN MG/24 HOURS) 21 mg/24hr patch Place 1 patch (21 mg total) onto the skin daily. 28 patch 0 11/03/2023 Morning   nitroGLYCERIN  (NITROSTAT ) 0.4 MG  SL tablet PLACE 1 TABLET UNDER THE TONGUE EVERY 5 MINUTES AS NEEDED CHEST PAIN. 25 tablet 1 Unknown   potassium chloride  SA (KLOR-CON  M) 20 MEQ tablet Take 20 mEq by mouth daily.   11/03/2023 Morning   rosuvastatin  (CRESTOR ) 40 MG tablet TAKE 1 TABLET BY MOUTH DAILY 90 tablet 3 11/03/2023 Morning   torsemide  (DEMADEX ) 20 MG tablet Take 1 tablet (20 mg total) by mouth daily.   11/03/2023 Morning   venlafaxine  XR (EFFEXOR -XR) 150 MG 24 hr capsule Take 150 mg by mouth daily.  0 11/03/2023 Morning    Assessment: 73y.o. with increased SOB and worsening edema, mildly elevated troponin (335>241). Is on eliquis  PTA, last dose was 8/6 at 2030. Hgb 10.2, Plts 266. Will forgo bolus and start with infusion since already anticoagulated.  Goal of Therapy:  Heparin  level 0.3-0.7 units/ml aPTT 66-102 seconds Monitor platelets by anticoagulation protocol: Yes   Plan:  Start heparin   infusion at 850 units/hr Check aPTT levels until correlating with heparin  level due to prior DOAC use Check 8 hr level Daily heparin , aPTT, and CBC Continue to monitor H&H, plts, and signs and symptoms of bleeding   Maria Arroyo 11/04/2023,8:07 AM

## 2023-11-04 NOTE — ED Provider Notes (Signed)
 [facility] Provider Note   CSN: [id number] Arrival date & time: 11/04/23  0425     Patient presents with: Shortness of Breath  HPI Maria Arroyo is a 73 y.o. female with COPD, afib on eliquis , CAD s/p stents, CKD stage IV, CHF presenting for shortness of breath. She states it started about 3 days ago.  She also mentioned that she fell on her right side that day as well.  She states she was somewhat short of breath before the fall but it was worse after she fell.  Reports a bruise to her right mid back and endorses pain in that area.  Denies chest pain at this time. She reports her normal oxygen requirement at home is 3 L.  Also endorsing lower extremity edema bilaterally.  She states it has been noticeable for the last couple weeks but definitely worse in the last few days.    Shortness of Breath      Prior to Admission medications   Medication Sig Start Date End Date Taking? Authorizing Provider  albuterol  (VENTOLIN  HFA) 108 (90 Base) MCG/ACT inhaler Inhale 2 puffs into the lungs every 6 (six) hours as needed for shortness of breath or wheezing. 02/17/23   [provider]  amiodarone  (PACERONE ) 200 MG tablet Take 1 tablet (200 mg total) by mouth daily. 12/09/22   Duke, Jon Garre, PA  apixaban  (ELIQUIS ) 5 MG TABS tablet Take 1 tablet (5 mg total) by mouth 2 (two) times daily. 12/09/22   Duke, Jon Garre, PA  Ascorbic Acid (VITAMIN C PO) Take 1 tablet by mouth daily.    [provider]  busPIRone  (BUSPAR ) 15 MG tablet Take 15 mg by mouth 2 (two) times daily.    [provider]  Cholecalciferol  (VITAMIN D) 2000 UNITS tablet Take 2,000 Units by mouth daily.    [provider]  gabapentin  (NEURONTIN ) 300 MG capsule Take 1 capsule (300 mg total) by mouth 2 (two) times daily. 05/16/23   Sebastian Toribio GAILS, MD  hydrALAZINE  (APRESOLINE ) 100 MG tablet Take one-half tablet (50 mg total) by mouth every  8 (eight) hours. 08/14/23   Dennise Lavada POUR, MD  Iron , Ferrous Sulfate , 325 (65 Fe) MG TABS Take 1 tablet by mouth daily. 07/06/23   Francella Rogue, MD  isosorbide  dinitrate (ISORDIL ) 10 MG tablet Take 1 tablet (10 mg total) by mouth 3 (three) times daily. 05/16/23   Sebastian Toribio GAILS, MD  loratadine  (CLARITIN ) 10 MG tablet Take 1 tablet (10 mg total) by mouth daily. 05/17/23   Sebastian Toribio GAILS, MD  Multiple Vitamin (MULTIVITAMIN WITH MINERALS) TABS tablet Take 1 tablet by mouth daily. 08/30/18   Briana Joya BIRCH, MD  nicotine  (NICODERM CQ  - DOSED IN MG/24 HOURS) 21 mg/24hr patch Place 1 patch (21 mg total) onto the skin daily. 08/14/23   Singh, Prashant K, MD  nitroGLYCERIN  (NITROSTAT ) 0.4 MG SL tablet PLACE 1 TABLET UNDER THE TONGUE EVERY 5 MINUTES AS NEEDED CHEST PAIN. Patient taking differently: Place 0.4 mg under the tongue every 5 (five) minutes as needed for chest pain. 05/25/22   [place], Peter M, MD  potassium chloride  20 MEQ TBCR Take 1 tablet (20 mEq total) by mouth daily. 08/09/23   Singh, Prashant K, MD  rosuvastatin  (CRESTOR ) 40 MG tablet TAKE 1 TABLET BY MOUTH DAILY Patient taking differently: Take 40 mg by mouth daily. TAKE 1 TABLET BY MOUTH DAILY 12/09/22   Duke, Jon Garre, PA  torsemide  (DEMADEX ) 20  MG tablet Take 1 tablet (20 mg total) by mouth daily. 08/16/23   Singh, Prashant K, MD  venlafaxine  XR (EFFEXOR -XR) 150 MG 24 hr capsule Take 150 mg by mouth daily.    [provider]    Allergies: Ace inhibitors and Codeine    Review of Systems  Respiratory:  Positive for shortness of breath.     Physical Exam   Vitals:   11/04/23 0530 11/04/23 0545  BP: 128/72 (!) 124/91  Pulse: 66 66  Resp: 16 15  Temp:    SpO2: 100% 95%    CONSTITUTIONAL:  well-appearing, NAD NEURO:  Alert and oriented x 3, CN 3-12 grossly intact EYES:  eyes equal and reactive ENT/NECK:  Supple, no stridor CARDIO:  Regular rate and rhythm, appears well-perfused, radial pulses 2+  bilaterally PULM: O2 sats in the mid 90s on 5 L, intermittently tachypneic, breath sounds diffusely diminished R>L, no wheezing or rales GI/GU:  non-distended, soft, non tender MSK/SPINE: Bruise noted to the right mid back. No gross deformities, 2+ pitting edema in both lower legs SKIN:  no rash, atraumatic  *Additional and/or pertinent findings included in MDM below  (all labs ordered are listed, but only abnormal results are displayed) Labs Reviewed  BASIC METABOLIC PANEL WITH GFR - Abnormal; Notable for the following components:      Result Value   Chloride 88 (*)    CO2 33 (*)    Glucose, Bld 169 (*)    BUN 53 (*)    Creatinine, Ser 2.26 (*)    GFR, Estimated 23 (*)    All other components within normal limits  CBC - Abnormal; Notable for the following components:   WBC 11.0 (*)    RBC 3.34 (*)    Hemoglobin 8.8 (*)    HCT 29.5 (*)    MCHC 29.8 (*)    RDW 17.1 (*)    nRBC 0.3 (*)    All other components within normal limits  BRAIN NATRIURETIC PEPTIDE - Abnormal; Notable for the following components:   B Natriuretic Peptide 1,970.1 (*)    All other components within normal limits  MAGNESIUM  - Abnormal; Notable for the following components:   Magnesium  2.7 (*)    All other components within normal limits  I-STAT VENOUS BLOOD GAS, ED - Abnormal; Notable for the following components:   pCO2, Ven 61.7 (*)    Bicarbonate 38.3 (*)    TCO2 40 (*)    Acid-Base Excess 12.0 (*)    Sodium 133 (*)    Calcium , Ion 1.09 (*)    HCT 30.0 (*)    Hemoglobin 10.2 (*)    All other components within normal limits  TROPONIN I (HIGH SENSITIVITY) - Abnormal; Notable for the following components:   Troponin I (High Sensitivity) 664 (*)    All other components within normal limits  TROPONIN I (HIGH SENSITIVITY)    EKG: EKG Interpretation Date/Time:  Thursday November 04 2023 04:37:09 EDT Ventricular Rate:  71 PR Interval:  233 QRS Duration:  182 QT Interval:  490 QTC  Calculation: 533 R Axis:   223  Text Interpretation: Sinus rhythm Prolonged PR interval Nonspecific intraventricular conduction delay Consider anterior infarct No significant change was found Confirmed by Trine Likes [phone]) on 11/04/2023 5:40:10 AM  Radiology: [facility] Chest Port 1 View Result Date: 11/04/2023 CLINICAL DATA:  Shortness of breath. EXAM: PORTABLE CHEST 1 VIEW COMPARISON:  Portable chest 08/11/2023 FINDINGS: 4:51 a.m. The heart is enlarged. There is increased central vascular  prominence, increased mild diffuse interstitial edema. There is an increased now moderate right pleural effusion a small left pleural effusion. There is overlying atelectasis or consolidation in the right lower lung field. Patchy perihilar hazy opacity is also seen and probably due to ground-glass edema. The aorta is tortuous and calcified, with stable mediastinum. Partially visible fusion plating lower cervical spine is again shown. No new osseous abnormality. There is degenerative change of the thoracic spine. IMPRESSION: 1. Cardiomegaly with increased central vascular prominence and mild diffuse interstitial edema. 2. Increased now moderate right pleural effusion and small left pleural effusion. 3. Overlying atelectasis or consolidation in the right lower lung field. 4. Patchy perihilar hazy opacity is also seen and probably due to ground-glass edema. Electronically Signed   By: Francis Quam M.D.   On: 11/04/2023 05:07     .Critical Care  Performed by: Lang Norleen POUR, PA-C Authorized by: Lang Norleen POUR, PA-C   Critical care provider statement:    Critical care time (minutes):  30   Critical care was necessary to treat or prevent imminent or life-threatening deterioration of the following conditions:  Respiratory failure (CHF excacerbation)   Critical care was time spent personally by me on the following activities:  Development of treatment plan with patient or surrogate, discussions with consultants,  evaluation of patient's response to treatment, examination of patient, ordering and review of laboratory studies, ordering and review of radiographic studies, ordering and performing treatments and interventions, pulse oximetry, re-evaluation of patient's condition and review of old charts    Medications Ordered in the ED  acetaminophen  (TYLENOL ) tablet 650 mg (has no administration in time range)    Or  acetaminophen  (TYLENOL ) suppository 650 mg (has no administration in time range)  melatonin tablet 3 mg (has no administration in time range)  ondansetron  (ZOFRAN ) injection 4 mg (has no administration in time range)  sodium chloride  flush (NS) 0.9 % injection 3 mL (3 mLs Intravenous Given 11/04/23 0434)  furosemide  (LASIX ) injection 80 mg (80 mg Intravenous Given 11/04/23 0529)    Clinical Course as of 11/04/23 0631  Thu Nov 04, 2023  0629 S- admit chf. Troponemia Hosp taking, cards to loop in [RC]    Clinical Course User Index [RC] Sharyne Darina RAMAN, MD                                 Medical Decision Making Amount and/or Complexity of Data Reviewed Labs: ordered. Radiology: ordered.   Initial Impression and Ddx 73 yo female in respiratory distress presenting for shortness of breath. Exam notable for tachypnea on 5 L (up from her baseline), diminished breath sounds right worse than left.  DDx includes CHF exacerbation, PE, COPD exacerbation, rib fracture, hemothorax, pneumothorax, pneumonia, sepsis, other. Patient PMH that increases complexity of ED encounter:  COPD, CAD, CKD stage IV, CHF  Interpretation of Diagnostics - I independent reviewed and interpreted the labs as followed: 1st trop 664, BNP 1970, GFR reduced but near baseline, anemia 8.8 (just below baseline)  - I independently visualized the following imaging with scope of interpretation limited to determining acute life threatening conditions related to emergency care: cxr, which revealed  1. Cardiomegaly with increased  central vascular prominence and mild diffuse interstitial edema. 2. Increased now moderate right pleural effusion and small left pleural effusion. 3. Overlying atelectasis or consolidation in the right lower lung field. 4. Patchy perihilar hazy opacity is also seen and probably  due to ground-glass edema.   - I personally reviewed and interpreted EKG which revealed sinus rhythm without evidence of ischemia  Patient Reassessment and Ultimate Disposition/Management Workup suggestive of CHF exacerbation associated respiratory failure. Also found to have moderately sized right pleural effusion. Per chart review had a small right pleural effusion on x-ray from May of this year. Also from a recent family medicine visit in her torsemide  was recently increased to 100 mg. She is stable on 5 L with sats in the mid 90s. Gave 80 mg of IV Lasix .Troponin is elevated at 664 but patient has not had chest pain throughout encounter. Admitted to hospital service with Dr. Eva Pore. Awaiting cardiology recommendations. Oncoming ED resident planning to take cards consult and will relay to hospital team.   Patient management required discussion with the following services or consulting groups:  Hospitalist Service  Complexity of Problems Addressed Acute complicated illness or Injury  Additional Data Reviewed and Analyzed Further history obtained from: Past medical history and medications listed in the EMR and Prior ED visit notes  Patient Encounter Risk Assessment Consideration of hospitalization      Final diagnoses:  Acute on chronic congestive heart failure, unspecified heart failure type [facility])    ED Discharge Orders     None          Lang Norleen POUR, PA-C 11/04/23 [id number]    Trine Raynell Moder, MD 11/04/23 [phone]

## 2023-11-04 NOTE — Progress Notes (Signed)
  Carryover admission to the Day Admitter.  I discussed this case with the EDP, [name], PA.  Per these discussions:   This is a 73 year old female with history of chronic diastolic heart failure, chronic hypoxic respiratory failure on 3 L continuous nasal cannula at baseline, who is being admitted with acute on chronic diastolic heart failure complicated by acute on chronic hypoxic respiratory failure presenting with 3 to 4 days of progressive shortness of breath associate with worsening of edema in the bilateral lower extremities.  No recent chest pain.  She did experience a ground-level mechanical fall 3 days ago, without any loss of consciousness or hitting of the head.   She has a reported history of chronic diastolic heart failure, most recent echocardiogram in 2020, which included LVEF 55 to 60%.  Over the last few days, she has increased her home dose of torsemide , but has not noted any ensuing improvement in her shortness of breath.  EMS found the patient's initial oxygen saturation to be in the mid 80s on her baseline 3 L nasal cannula, with ensuing improvement in oxygen saturations into the mid 90s on 5 L nasal cannula.  Troponin x 1 was found to be mildly elevated, with repeat currently pending.  Magnesium  level 2.7.  Chest x-ray shows interval increase in size of right-sided pleural effusion relative to the right-sided pleural effusion noted on imaging in May 2025.  EDP awaiting to hear back from cardiology regarding additional recommendations;  She has received Lasix  80 mg IV x 1 dose in the ED this evening.   I have placed an order for inpatient to PCU for further evaluation management of the above.  I have placed some additional preliminary admit orders via the adult multi-morbid admission order set. I have also ordered echocardiogram for the morning.  I will defer orders for additional diuresis to the admitting hospitalist.    Eva Pore, DO Hospitalist

## 2023-11-04 NOTE — ED Triage Notes (Addendum)
 Patient BIB [facility] for evaluation of SOB. Patient called EMS for difficulty breathing. Has hx of COPD. Patient on 3L [place] baseline. Arrives on [facility]. Patient reports pain with inspiration but states she fell 3 days ago and hit her right side. Patient states she heard a crack when she fell.   Patient received 50mcg fentanyl  with EMS

## 2023-11-04 NOTE — Telephone Encounter (Signed)
 Patient Product/process development scientist completed.    The patient is insured through [facility]. Patient has Medicare and is not eligible for a copay card, but may be able to apply for patient assistance or Medicare RX Payment Plan (Patient Must reach out to their plan, if eligible for payment plan), if available.    Ran test claim for Farxiga 10 mg and the current 30 day co-pay is $47.00.  Ran test claim for Jardiance 10 mg and the current 30 day co-pay is $47.00.  This test claim was processed through [facility]- copay amounts may vary at other pharmacies due to pharmacy/plan contracts, or as the patient moves through the different stages of their insurance plan.     Roland Earl, CPHT Pharmacy Technician III Certified Patient Advocate [facility] Patient Advocate Team Direct Number: [phone]  Fax: [phone]

## 2023-11-04 NOTE — Consult Note (Addendum)
 Cardiology Consultation   Patient ID: Maria Arroyo MRN: [id number]; DOB: 11/06/1950  Admit date: 11/04/2023 Date of Consult: 11/04/2023  PCP:  Aminta Lamar Hamilton, MD   [facility] Providers Cardiologist:  Peter Swaziland, MD        Patient Profile: Maria Arroyo is a 73 y.o. female with a hx of CAD s/p DES to RCA in 1999 and 2002 and 2011, chronic diastolic heart failure, hypertension, hyperlipidemia, atrial fibrillation on eliquis , AAA, hx of lung cancer s/p lobectomy, COPD, hypothyroidism, CKD Stage IV and chronic pain who is being seen 11/04/2023 for the evaluation of heart failure exacerbation at the request of Maximino Sharps MD.  History of Present Illness: [facility] Ar[facility]o [facility] with [facility].   Patient underwent her first LHC in 1999 with DES to distal RCA. She then returned to the cath lab in 2002 s/p DES to mid RCA. In 2011 she developed high grade stenosis between the two RCA stents, s/p DES to mid RCA. She was put on ASA and plavix  which then were stopped when she developed atrial fibrillation after her R VATS with RLL lobectomy. She was loaded on amiodarone  and placed on eliquis  and 2.5 mg bisoprolol . ASA and plavix  were discontinued. In 2020 she was admitted for acute respiratory failure, acute anemia requiring transfusion and hypoxemia, she was placed on lasix , hydrochlorothiazide , and was sent home on 3L of oxygen.   Most recent echocardiogram 08/27/2018 showed LVEF 55-60% with impaired relaxation. Mildly enlarged RV with moderately reduced systolic function. Moderately dilated LA. Mild to moderate MR.  Last office visit was 12/09/22 with Maria Duke PA-C. Patient was endorsing limited exercise 2/2 oxygen use and back pain. .   Presented to the ED on 8/7 for progressive shortness of breath for the last 3 days.  In the ED she was continued on 3L  and remained normotensive.  Lab work: K 5.0; Cr 2.26;  Mag 2.7; BNP 1,970  troponin 664->758 ECG:  Sinus with 1st Degree AV block, extreme RAD and IVCD VR 71 CXR shows evidence of pulmonary congestion with moderate right pleural effusion and mild pleural effusion  Echocardiogram pending  She has been given one dose of IV lasix  80 mg and one dose of IV lasixc 40 mg, IV lasix  80 mg BID is scheduled starting this evening  On interview, patient shared she started noticing fluid accumulation about 3 weeks ago starting in her abdomen then her legs. Her appetite has been decreasing. Her nephrologist for last week had her increase her torsemide  to BID, she felt it did not make a difference. Then 3 days ago she started developing lightheadedness, dizziness, fatigue, worsening shortness of breath, and PND. She noticed she has been having to sleep more upright. She did fall during this time in the shower due to losing her balance, no LOC or head strike. Does have ongoing back pain. Yesterday she went to her PCP who sent in a prescription for torsemide  100 mg, she was unable to obtain this medication before the pharmacy closed. She never took this medication.   Patient denied chest pain and palpitations. She did share that she has never experienced chest pain even prior to stent placements. Though on chart review back in 2013 she was reporting chest pain relieved by nitroglycerin .   She did notice prior to the fluid accumulation that she had some nausea, though never had an episode of diarrhea. No other symptoms or sick contacts.No change to her diet. She has remained compliant with  her medications. Home Health visits twice a week.   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 11/07/2018   COPD (chronic obstructive pulmonary disease) (HCC)    Coronary artery disease    Depression    Gout    R foot and knee   Hyperlipidemia    Hypertension    Left foot drop    Neuropathy    OA (osteoarthritis)    Pica     Tobacco abuse     Past Surgical History:  Procedure Laterality Date   ABDOMINAL HYSTERECTOMY     ANTERIOR CERVICAL DECOMP/DISCECTOMY FUSION  02/06/2011   Procedure: ANTERIOR CERVICAL DECOMPRESSION/DISCECTOMY FUSION 2 LEVELS;  Surgeon: Rockey LITTIE Peru;  Location: [facility]S;  Service: Neurosurgery;  Laterality: N/A;  Anterior Cervical Four-Five/Five-Six Decompression and Fusion with Plating and Bonegraft   BLADDER SURGERY     BONE BIOPSY  07/04/2023   Procedure: BIOPSY, GASTRIC;  Surgeon: Leigh Elspeth SQUIBB, MD;  Location: [facility] ENDOSCOPY;  Service: Gastroenterology;;   CARDIAC CATHETERIZATION  04/09/2009   EF 60%   CARDIAC CATHETERIZATION  03/24/2001   EF 55%   CARDIAC CATHETERIZATION  01/06/2001   EF 65%   CHOLECYSTECTOMY     COLONOSCOPY N/A 07/04/2023   Procedure: COLONOSCOPY;  Surgeon: Leigh Elspeth SQUIBB, MD;  Location: [facility] ENDOSCOPY;  Service: Gastroenterology;  Laterality: N/A;   CORONARY STENT PLACEMENT  03/2009   STENTING OF THE PROXIMAL TO MID RIGHT CORONARY   ESOPHAGOGASTRODUODENOSCOPY N/A 07/04/2023   Procedure: EGD (ESOPHAGOGASTRODUODENOSCOPY);  Surgeon: Leigh Elspeth SQUIBB, MD;  Location: [facility] ENDOSCOPY;  Service: Gastroenterology;  Laterality: N/A;   GIVENS CAPSULE STUDY N/A 07/04/2023   Procedure: IMAGING PROCEDURE, GI TRACT, INTRALUMINAL, VIA CAPSULE;  Surgeon: Leigh Elspeth SQUIBB, MD;  Location: [facility] ENDOSCOPY;  Service: Gastroenterology;  Laterality: N/A;   IR THORACENTESIS ASP PLEURAL SPACE W/IMG GUIDE  06/01/2018   LEFT HEART CATHETERIZATION WITH CORONARY ANGIOGRAM N/A 12/21/2011   Procedure: LEFT HEART CATHETERIZATION WITH CORONARY ANGIOGRAM;  Surgeon: Peter M Swaziland, MD;  Location: [facility] CATH LAB;  Service: Cardiovascular;  Laterality: N/A;   LOBECTOMY     NODE DISSECTION N/A 04/04/2018   Procedure: NODE DISSECTION;  Surgeon: Kerrin Elspeth BROCKS, MD;  Location: [facility] OR;  Service: Thoracic;  Laterality: N/A;   OTHER SURGICAL HISTORY  12/2014   R foot cyst removal   VIDEO ASSISTED  THORACOSCOPY (VATS)/ LOBECTOMY Right 04/04/2018   Procedure: VIDEO ASSISTED THORACOSCOPY (VATS)/RIGHT LOWER LOBECTOMY;  Surgeon: Kerrin Elspeth BROCKS, MD;  Location: [facility] OR;  Service: Thoracic;  Laterality: Right;       Scheduled Meds:  amiodarone   200 mg Oral Daily   busPIRone   15 mg Oral BID   ferrous sulfate   325 mg Oral Daily   furosemide   80 mg Intravenous BID   hydrALAZINE   25 mg Oral TID   isosorbide  dinitrate  10 mg Oral TID   lidocaine   1 patch Transdermal Q24H   nicotine   21 mg Transdermal Daily   rosuvastatin   10 mg Oral Daily   sodium chloride  flush  3 mL Intravenous Q12H   venlafaxine  XR  150 mg Oral Daily   Continuous Infusions:  heparin  850 Units/hr (11/04/23 0900)   PRN Meds: acetaminophen  **OR** acetaminophen , albuterol , HYDROcodone -acetaminophen , melatonin  Allergies:    Allergies  Allergen Reactions   Ace Inhibitors Other (See Comments)    Worsening renal function  Codeine Nausea Only    Social History:   Social History   Socioeconomic History   Marital status: Married    Spouse name: Not on file   Number of children: 2   Years of education: 10   Highest education level: Not on file  Occupational History   Occupation: housewife    Employer: UNEMPLOYED  Tobacco Use   Smoking status: Every Day    Current packs/day: 2.00    Average packs/day: 2.0 packs/day for 35.0 years (70.0 ttl pk-yrs)    Types: Cigarettes   Smokeless tobacco: Never   Tobacco comments:    1-1.5 ppd (10/02/13), 03/17/18 2 PPD  Vaping Use   Vaping status: Never Used  Substance and Sexual Activity   Alcohol use: No    Alcohol/week: 0.0 standard drinks of alcohol   Drug use: No   Sexual activity: Not on file  Other Topics Concern   Not on file  Social History Narrative   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   The patient has a 10th grade education.   The patient is left-handed.   The patient has two children.   Patient drinks three cups of soda  daily.       Social Drivers of Corporate investment banker Strain: Low Risk  (05/12/2023)   Received from [facility]   Overall Financial Resource Strain (CARDIA)    Difficulty of Paying Living Expenses: Not hard at all  Food Insecurity: No Food Insecurity (11/04/2023)   Hunger Vital Sign    Worried About Running Out of Food in the Last Year: Never true    Ran Out of Food in the Last Year: Never true  Recent Concern: Food Insecurity - Food Insecurity Present (08/11/2023)   Hunger Vital Sign    Worried About Running Out of Food in the Last Year: Never true    Ran Out of Food in the Last Year: Sometimes true  Transportation Needs: No Transportation Needs (11/04/2023)   PRAPARE - Administrator, Civil Service (Medical): No    Lack of Transportation (Non-Medical): No  Physical Activity: Unknown (03/10/2023)   Received from [facility]   Exercise Vital Sign    On average, how many days per week do you engage in moderate to strenuous exercise (like a brisk walk)?: 0 days    Minutes of Exercise per Session: Not on file  Stress: No Stress Concern Present (03/10/2023)   Received from [facility] - Occupational Stress Questionnaire    Feeling of Stress : Not at all  Social Connections: Moderately Isolated (11/04/2023)   Social Connection and Isolation Panel    Frequency of Communication with Friends and Family: More than three times a week    Frequency of Social Gatherings with Friends and Family: More than three times a week    Attends Religious Services: Never    Database administrator or Organizations: No    Attends Banker Meetings: Never    Marital Status: Married  Catering manager Violence: Not At Risk (11/04/2023)   Humiliation, Afraid, Rape, and Kick questionnaire    Fear of Current or Ex-Partner: No    Emotionally Abused: No    Physically Abused: No    Sexually Abused: No    Family History:   Family History   Problem Relation Age of Onset   Aneurysm Mother    Heart attack Father    Heart  failure Father    Stroke Sister    Heart attack Brother      ROS:  Please see the history of present illness.  All other ROS reviewed and negative.     Physical Exam/Data: Vitals:   11/04/23 0545 11/04/23 0645 11/04/23 0700 11/04/23 0757  BP: (!) 124/91 130/69 124/78 132/80  Pulse: 66 63  71  Resp: 15 16 18 18   Temp:    98.2 F (36.8 C)  TempSrc:    Oral  SpO2: 95% 91%  91%  Weight:    70.5 kg  Height:    4' 11 (1.499 m)   No intake or output data in the 24 hours ending 11/04/23 1326    11/04/2023    7:57 AM 11/04/2023    4:31 AM 07/06/2023    4:07 AM  Last 3 Weights  Weight (lbs) 155 lb 6.8 oz 158 lb 159 lb 6.3 oz  Weight (kg) 70.5 kg 71.668 kg 72.3 kg     Body mass index is 31.39 kg/m.  General:  Chronically ill, in no acute distress though appears uncomfortable HEENT: normal Neck: JVD Cardiac:  normal S1, S2; RRR; no murmur  Lungs:  Diminished with wheezing bilaterally Abd: Distended, but compressible, non tender  Ext: 2+ pitting edema bilaterally Musculoskeletal:  No deformities Skin: warm and dry  Neuro:  CNs 2-12 intact, no focal abnormalities noted Psych:  Normal affect   EKG:  The EKG was personally reviewed and demonstrates:  See Hpi Telemetry:  Telemetry was personally reviewed and demonstrates:  sinus with 1st degree av block with IVCD VR 60s  Relevant CV Studies: Echocardiogram 08/27/18 IMPRESSIONS     1. The left ventricle has normal systolic function, with an ejection  fraction of 55-60%. The cavity size was mildly dilated. Left ventricular  diastolic Doppler parameters are consistent with impaired relaxation.   2. The right ventricle has moderately reduced systolic function. The  cavity was mildly enlarged. There is no increase in right ventricular wall  thickness.   3. Poor acoustic windows limit study of RV.   4. Right atrial size was moderately dilated.   5.  The mitral valve is abnormal. Mild thickening of the mitral valve  leaflet. Mild calcification of the mitral valve leaflet. Mitral valve  regurgitation is mild to moderate by color flow Doppler.   6. MR is eccentric, directed posterior into LA Mild to moderate in  severity.   7. The aortic valve is abnormal. Mild thickening of the aortic valve.  Moderate calcification of the aortic valve.   8. The interatrial septum was not assessed.   Laboratory Data: High Sensitivity Troponin:   Recent Labs  Lab 11/04/23 0439 11/04/23 0645  TROPONINIHS 664* 758*     Chemistry Recent Labs  Lab 11/04/23 0439 11/04/23 0450  NA 135 133*  K 5.0 4.9  CL 88*  --   CO2 33*  --   GLUCOSE 169*  --   BUN 53*  --   CREATININE 2.26*  --   CALCIUM  9.0  --   MG 2.7*  --   GFRNONAA 23*  --   ANIONGAP 14  --     No results for input(s): PROT, ALBUMIN, AST, ALT, ALKPHOS, BILITOT in the last 168 hours. Lipids No results for input(s): CHOL, TRIG, HDL, LABVLDL, LDLCALC, CHOLHDL in the last 168 hours.  Hematology Recent Labs  Lab 11/04/23 0439 11/04/23 0450  WBC 11.0*  --   RBC 3.34*  --  HGB 8.8* 10.2*  HCT 29.5* 30.0*  MCV 88.3  --   MCH 26.3  --   MCHC 29.8*  --   RDW 17.1*  --   PLT 266  --    Thyroid No results for input(s): TSH, FREET4 in the last 168 hours.  BNP Recent Labs  Lab 11/04/23 0439  BNP 1,970.1*    DDimer No results for input(s): DDIMER in the last 168 hours.  Radiology/Studies:  DG Chest Port 1 View Result Date: 11/04/2023 CLINICAL DATA:  Shortness of breath. EXAM: PORTABLE CHEST 1 VIEW COMPARISON:  Portable chest 08/11/2023 FINDINGS: 4:51 a.m. The heart is enlarged. There is increased central vascular prominence, increased mild diffuse interstitial edema. There is an increased now moderate right pleural effusion a small left pleural effusion. There is overlying atelectasis or consolidation in the right lower lung field. Patchy perihilar hazy  opacity is also seen and probably due to ground-glass edema. The aorta is tortuous and calcified, with stable mediastinum. Partially visible fusion plating lower cervical spine is again shown. No new osseous abnormality. There is degenerative change of the thoracic spine. IMPRESSION: 1. Cardiomegaly with increased central vascular prominence and mild diffuse interstitial edema. 2. Increased now moderate right pleural effusion and small left pleural effusion. 3. Overlying atelectasis or consolidation in the right lower lung field. 4. Patchy perihilar hazy opacity is also seen and probably due to ground-glass edema. Electronically Signed   By: Francis Quam M.D.   On: 11/04/2023 05:07     Assessment and Plan: Chronic Diastolic Heart Failure Diagnosed in 2020, follows with [facility].  She uses torsemide  20 mg daily, though started to noticing she was accumulating fluid about 3 weeks ago. Her torsemide  was increased to BID for one week without improvement. Patient notes lightheadedness, dizziness, fatigue, shortness of breath at rest, orthopnea, PND, abdominal distention, early satiety, and peripheral edema.  CXR shows signs of pulmonary congestion and bilateral pleural effusions BNP 1,970 Troponin elevation indicates demand ischemia, though will trend She was given IV diuretics, she reports poor urine output though does feel like her breathing had somewhat improved No I/Os charted On exam she appears volume up Echocardiogram pending  She does have a history of hypokalemia, continue to monitor with diuresis -give 2.5 mg metolazone  now, then IV lasix  80 mg one hour later. Will reassess volume tomorrow for further diuresis plan -will consider adding jardiance, will reach out to pharmacy rx advocate team prior to initiation.  CKD Baseline appears to be ~1.9 based on outside hospital lab work Today Cr 2.26 Continue to monitor  CAD s/p DES to RCA x 3 Hyperlipidemia Denied chest pain. ECG shows  no signs of ischemia.  Will continue to trend troponin as there was a small delta between the two values obtained Echocardiogram pending will assess for RWMA  Antiplatelet deferred 2/2 chronic anticoagulation.  Continue isordil  10 mg TID Continue heparin  gtt with eliquis  on hold  Patient confirmed her normal PTA dose of crestor  is 40mg . She did have mildly elevated LFTs 07/2023. Will obtain LFTs, then pending results will put her on high intensity lipitor 2/2 to her CAD and renal disease.  Hypertension BP: 132/80 Continue hydralazine  25 mg TID Continue isordil  10 mg TID  Paroxysmal Atrial Fibrillation She is in sinus rhythm now.  [place] Vasc score 6 Continue to hold eliquis  Continue heparin  gtt Patient had previously been on bisoprolol  2.5 mg until 07/2023 when she was hospitalized with an AKI. Will hold on restarting at this  time, would like to assess EF prior to initiation.   Tobacco use Continue NRT and education on cessation  T2DM (A1c 7.0 %) Repeat A1c pending Treatment per primary  Per primary COPD Anxiety and Depression Chronic pain Hx of lung cancer   Risk Assessment/Risk Scores:     New [place]  Heart Association (NYHA) Functional Class NYHA Class IV  CHA2DS2-VASc Score = 6   This indicates a 9.7% annual risk of stroke. The patient's score is based upon: CHF History: 1 HTN History: 1 Diabetes History: 1 Stroke History: 0 Vascular Disease History: 1 Age Score: 1 Gender Score: 1        For questions or updates, please contact  [facility] Please consult [email or web address] for contact info under    Signed, Leontine LOISE Salen, PA-C  11/04/2023 1:26 PM   I have seen and examined the patient along with Leontine LOISE Salen, PA-C .  I have reviewed the chart, notes and new data.  I agree with PA/NP's note.  Key new complaints: very dyspneic, no chest pain. Clearly orthopneic. Key examination changes: Severe elevation in JVP to angle of jaw and 3+ bilateral  leg edema, abdominal distention, reduced breath sounds in both bases. She weighs 155 lb and she reports that her home weight when doing well was 143 lb (surprisingly much lower than documented at last Cardiology office visit, but that was almost a year ago) Key new findings / data: Creatinine slightly worse than baseline (seems to be 1.9-2.0) and normal electrolytes. Echo pending. NSR w LBBB on ECG . Moderate elevation in troponin is marginally dynamic and likely is due to HF exacerbation, rather than true atherothrombotic MI.   PLAN: Severe HF decompensation with biventricular manifestations. Mediocre UO response to diuretic so far. Echo pending. Add metolazone  before next dose of IV furosemide , monitor renal function carefully. Risk of acute renal failure outweighs benefit of coronary angiography at this time. IV heparin  appropriate for the time being. She appears quite ill. Need to have a discussion re: progression of renal disease and her potential candidacy for chronic RRT.  Jerel Balding, MD, FACC [facility] [phone] 11/04/2023, 2:27 PM

## 2023-11-04 NOTE — ED Notes (Signed)
 Purewick placed [phone]

## 2023-11-05 ENCOUNTER — Other Ambulatory Visit: Payer: Self-pay

## 2023-11-05 DIAGNOSIS — N189 Chronic kidney disease, unspecified: Secondary | ICD-10-CM

## 2023-11-05 DIAGNOSIS — J9601 Acute respiratory failure with hypoxia: Secondary | ICD-10-CM

## 2023-11-05 DIAGNOSIS — I480 Paroxysmal atrial fibrillation: Secondary | ICD-10-CM | POA: Diagnosis not present

## 2023-11-05 DIAGNOSIS — F17210 Nicotine dependence, cigarettes, uncomplicated: Secondary | ICD-10-CM

## 2023-11-05 DIAGNOSIS — N179 Acute kidney failure, unspecified: Secondary | ICD-10-CM

## 2023-11-05 DIAGNOSIS — I5021 Acute systolic (congestive) heart failure: Secondary | ICD-10-CM

## 2023-11-05 DIAGNOSIS — D508 Other iron deficiency anemias: Secondary | ICD-10-CM

## 2023-11-05 DIAGNOSIS — I219 Acute myocardial infarction, unspecified: Secondary | ICD-10-CM | POA: Diagnosis not present

## 2023-11-05 DIAGNOSIS — I214 Non-ST elevation (NSTEMI) myocardial infarction: Secondary | ICD-10-CM | POA: Diagnosis not present

## 2023-11-05 DIAGNOSIS — I5043 Acute on chronic combined systolic (congestive) and diastolic (congestive) heart failure: Secondary | ICD-10-CM

## 2023-11-05 LAB — HEMOGLOBIN A1C
Hgb A1c MFr Bld: 6.3 % — ABNORMAL HIGH (ref 4.8–5.6)
Mean Plasma Glucose: 134 mg/dL

## 2023-11-05 LAB — BASIC METABOLIC PANEL WITH GFR
Anion gap: 13 (ref 5–15)
BUN: 59 mg/dL — ABNORMAL HIGH (ref 8–23)
CO2: 37 mmol/L — ABNORMAL HIGH (ref 22–32)
Calcium: 9.1 mg/dL (ref 8.9–10.3)
Chloride: 87 mmol/L — ABNORMAL LOW (ref 98–111)
Creatinine, Ser: 2.39 mg/dL — ABNORMAL HIGH (ref 0.44–1.00)
GFR, Estimated: 21 mL/min — ABNORMAL LOW (ref >60)
Glucose, Bld: 143 mg/dL — ABNORMAL HIGH (ref 70–99)
Potassium: 4.9 mmol/L (ref 3.5–5.1)
Sodium: 137 mmol/L (ref 135–145)

## 2023-11-05 LAB — COOXEMETRY PANEL
Carboxyhemoglobin: 1.4 % (ref 0.5–1.5)
Methemoglobin: <0.7 % (ref 0.0–1.5)
O2 Saturation: 44.8 %
Total hemoglobin: 8.8 g/dL — ABNORMAL LOW (ref 12.0–16.0)

## 2023-11-05 LAB — CBC
HCT: 29.2 % — ABNORMAL LOW (ref 36.0–46.0)
Hemoglobin: 8.6 g/dL — ABNORMAL LOW (ref 12.0–15.0)
MCH: 25.8 pg — ABNORMAL LOW (ref 26.0–34.0)
MCHC: 29.5 g/dL — ABNORMAL LOW (ref 30.0–36.0)
MCV: 87.7 fL (ref 80.0–100.0)
Platelets: 250 K/uL (ref 150–400)
RBC: 3.33 MIL/uL — ABNORMAL LOW (ref 3.87–5.11)
RDW: 17.1 % — ABNORMAL HIGH (ref 11.5–15.5)
WBC: 8 K/uL (ref 4.0–10.5)
nRBC: 0.5 % — ABNORMAL HIGH (ref 0.0–0.2)

## 2023-11-05 LAB — HEPARIN LEVEL (UNFRACTIONATED)
Heparin Unfractionated: >1.1 [IU]/mL — ABNORMAL HIGH (ref 0.30–0.70)
Heparin Unfractionated: >1.1 [IU]/mL — ABNORMAL HIGH (ref 0.30–0.70)

## 2023-11-05 LAB — APTT
aPTT: 55 s — ABNORMAL HIGH (ref 24–36)
aPTT: 72 s — ABNORMAL HIGH (ref 24–36)

## 2023-11-05 LAB — GLUCOSE, CAPILLARY
Glucose-Capillary: 136 mg/dL — ABNORMAL HIGH (ref 70–99)
Glucose-Capillary: 204 mg/dL — ABNORMAL HIGH (ref 70–99)
Glucose-Capillary: 188 mg/dL — ABNORMAL HIGH (ref 70–99)
Glucose-Capillary: 221 mg/dL — ABNORMAL HIGH (ref 70–99)

## 2023-11-05 LAB — LACTIC ACID, PLASMA: Lactic Acid, Venous: 1.1 mmol/L (ref 0.5–1.9)

## 2023-11-05 MED ORDER — ACETAZOLAMIDE 250 MG PO TABS: 500.0000 mg | ORAL_TABLET | Freq: Two times a day (BID) | ORAL | Status: DC

## 2023-11-05 MED ORDER — SODIUM CHLORIDE 0.9% FLUSH: 10.0000 mL | INTRAVENOUS | Status: DC | PRN

## 2023-11-05 MED ORDER — ORAL CARE MOUTH RINSE: 15.0000 mL | OROMUCOSAL | Status: DC | PRN

## 2023-11-05 MED ORDER — FUROSEMIDE 10 MG/ML IJ SOLN
120.0000 mg | Freq: Two times a day (BID) | INTRAVENOUS | Status: DC
  Administered 2023-11-05 – 2023-11-07 (×6): 120 mg via INTRAVENOUS
  Filled 2023-11-05: qty 120
  Filled 2023-11-05: qty 12
  Filled 2023-11-05 (×2): qty 120
  Filled 2023-11-05 (×2): qty 10
  Filled 2023-11-05: qty 12
  Filled 2023-11-05: qty 10

## 2023-11-05 MED ORDER — METOLAZONE 5 MG PO TABS
5.0000 mg | ORAL_TABLET | Freq: Once | ORAL | Status: AC
  Administered 2023-11-05: 5 mg via ORAL
  Filled 2023-11-05: qty 1

## 2023-11-05 MED ORDER — MILRINONE LACTATE IN DEXTROSE 20-5 MG/100ML-% IV SOLN: 0.1250 ug/kg/min | INTRAVENOUS | Status: DC

## 2023-11-05 MED ORDER — CHLORHEXIDINE GLUCONATE CLOTH 2 % EX PADS
6.0000 | MEDICATED_PAD | Freq: Every day | CUTANEOUS | Status: DC
  Administered 2023-11-05 – 2023-11-14 (×13): 6 via TOPICAL

## 2023-11-05 MED ORDER — SODIUM CHLORIDE 0.9% FLUSH
10.0000 mL | Freq: Two times a day (BID) | INTRAVENOUS | Status: DC
  Administered 2023-11-05 – 2023-11-08 (×8): 10 mL
  Administered 2023-11-08 (×2): 40 mL
  Administered 2023-11-09: 20 mL
  Administered 2023-11-09: 10 mL
  Administered 2023-11-09: 20 mL
  Administered 2023-11-09 – 2023-11-14 (×5): 10 mL

## 2023-11-05 MED ORDER — MILRINONE LACTATE IN DEXTROSE 20-5 MG/100ML-% IV SOLN
0.1250 ug/kg/min | INTRAVENOUS | Status: DC
  Administered 2023-11-05 – 2023-11-08 (×6): 0.25 ug/kg/min via INTRAVENOUS
  Filled 2023-11-05 (×5): qty 100

## 2023-11-05 NOTE — Plan of Care (Signed)
  Problem: Education: Goal: Knowledge of General Education information will improve Description: Including pain rating scale, medication(s)/side effects and non-pharmacologic comfort measures Outcome: Progressing   Problem: Clinical Measurements: Goal: Ability to maintain clinical measurements within normal limits will improve Outcome: Progressing   Problem: Clinical Measurements: Goal: Will remain free from infection Outcome: Progressing   Problem: Clinical Measurements: Goal: Diagnostic test results will improve Outcome: Progressing   Problem: Activity: Goal: Risk for activity intolerance will decrease Outcome: Progressing   Problem: Clinical Measurements: Goal: Cardiovascular complication will be avoided Outcome: Progressing   Problem: Nutrition: Goal: Adequate nutrition will be maintained Outcome: Progressing

## 2023-11-05 NOTE — TOC Initial Note (Signed)
 Transition of Care [facility]) - Initial/Assessment Note    Patient Details  Name: Maria Arroyo MRN: [id number] Date of Birth: 1950/12/16  [facility]) CM/SW Contact:    Justina Delcia Czar, RN Phone Number: [phone] 11/05/2023, 5:40 PM  Clinical Narrative:                 Spoke to pt and states she lives with husband. Gave permission to speak to husband, dtr or niece. States niece takes her to appts. Has Rollator, scale and oxygen at home. Discussed daily weights. Pt has Living Better with HF at bedside. Educated on low sodium/heart healthy diet. Pt is active with [facility], will need HH RN, HH OT, and HHPT orders with [facility]. Contacted rep, Channing.   Will continue to follow for dc needs.     Expected Discharge Plan: Home w Home Health Services Barriers to Discharge: Continued Medical Work up   Patient Goals and CMS Choice Patient states their goals for this hospitalization and ongoing recovery are:: wants to return home CMS Medicare.gov Compare Post Acute Care list provided to:: Patient Choice offered to / list presented to : Patient      Expected Discharge Plan and Services   Discharge Planning Services: CM Consult Post Acute Care Choice: Home Health Living arrangements for the past 2 months: Single Family Home                           HH Arranged: RN, PT [facility] Agency: [facility] Date [facility] Agency Contacted: 11/05/23 Time HH Agency Contacted: [phone] Representative spoke with at [facility] Agency: Channing Ee  Prior Living Arrangements/Services Living arrangements for the past 2 months: Single Family Home Lives with:: Spouse Patient language and need for interpreter reviewed:: Yes Do you feel safe going back to the place where you live?: Yes      Need for Family Participation in Patient Care: No (Comment) Care giver support system in place?: Yes (comment) Current home services: DME (cane, rolling walker, bedside commode and oxygen via  [employer]) Criminal Activity/Legal Involvement Pertinent to Current Situation/Hospitalization: No - Comment as needed  Activities of Daily Living   ADL Screening (condition at time of admission) Independently performs ADLs?: Yes (appropriate for developmental age) Is the patient deaf or have difficulty hearing?: No Does the patient have difficulty seeing, even when wearing glasses/contacts?: No Does the patient have difficulty concentrating, remembering, or making decisions?: No  Permission Sought/Granted Permission sought to share information with : Case Manager, Family Supports, PCP Permission granted to share information with : Yes, Verbal Permission Granted  Share Information with NAME: Grayce Riding  Permission granted to share info w AGENCY: [facility], DME, PCP  Permission granted to share info w Relationship: niece  Permission granted to share info w Contact Information: [phone]  Emotional Assessment Appearance:: Appears stated age Attitude/Demeanor/Rapport: Engaged Affect (typically observed): Accepting Orientation: : Oriented to Self, Oriented to Place, Oriented to  Time, Oriented to Situation   Psych Involvement: No (comment)  Admission diagnosis:  Acute on chronic diastolic heart failure (HCC) [I50.33] Acute on chronic congestive heart failure, unspecified heart failure type [facility]) [I50.9] Patient Active Problem List   Diagnosis Date Noted   Acute hypoxic respiratory failure (HCC) 11/05/2023   Acute HFrEF (heart failure with reduced ejection fraction) (HCC) 11/05/2023   NSTEMI (non-ST elevated myocardial infarction) (HCC) 11/05/2023   Acute on chronic diastolic heart failure (HCC) 11/04/2023   Elevated troponin  08/11/2023   Long QT interval 08/11/2023   Normocytic anemia 08/11/2023   History of lung cancer 08/11/2023   Metabolic alkalosis 07/04/2023   Tobacco use disorder 07/03/2023   Hereditary and idiopathic peripheral neuropathy 07/03/2023   Palliative care  encounter 07/03/2023   Pressure injury of skin 07/03/2023   Malignant neoplasm of lung (HCC) 07/02/2023   Acute on chronic anemia 07/02/2023   CKD (chronic kidney disease) stage 4, GFR 15-29 ml/min (HCC) 07/02/2023   Chronic hypoxic respiratory failure (HCC) 07/02/2023   Iron  deficiency anemia 07/02/2023   Anticoagulated 07/02/2023   UTI (urinary tract infection) 05/15/2023   Acute on chronic respiratory failure with hypoxia and hypercapnia (HCC) 05/14/2023   COVID-19 virus infection 05/14/2023   Controlled type 2 diabetes mellitus without complication, without long-term current use of insulin  (HCC) 05/14/2023   Acute kidney injury superimposed on chronic kidney disease (HCC) 05/14/2023   Hypokalemia 05/14/2023   Hypophosphatemia 05/14/2023   COPD (chronic obstructive pulmonary disease) (HCC) 05/13/2023   COPD with acute exacerbation (HCC) 05/12/2023   Branch retinal vein occlusion with macular edema of left eye 12/31/2021   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   Cystoid macular edema of right eye 12/10/2021   Cystoid macular edema, left eye 12/10/2021   Branch retinal vein occlusion of right eye with macular edema 12/10/2021   Posterior vitreous detachment of both eyes 12/10/2021   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 11/07/2018   Right ventricular failure (HCC) 08/31/2018   Symptomatic anemia 08/26/2018   Atrial fibrillation (HCC) 08/26/2018   Recurrent right pleural effusion 08/26/2018   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) 04/28/2018   S/P lobectomy of lung 04/04/2018   Solitary pulmonary nodule 02/04/2018   Neuropathy 03/18/2017   Disturbance of skin sensation 03/10/2013   Cervical spondylosis with myelopathy 03/10/2013   Abnormality of gait 03/10/2013   Blood glucose elevated 11/18/2012   Arthritis 02/02/2011   Chronic pain 02/02/2011   Fibrositis 02/02/2011   Coronary artery disease    COPD GOLD II if use FEV1/VC ratio      Essential hypertension    Hyperlipidemia    Cigarette smoker    Anxiety and depression    PCP:  Aminta Lamar Hamilton, MD Pharmacy:   [id number] [place], [place] - [address] AT [facility] [address] [place] [address] Phone: [phone] Fax: [phone]  [facility] [address] [place] [address] Phone: [phone] Fax: [phone]  [facility] - [place], [place] - [address] [place] [address] Phone: [phone] Fax: [phone]     Social Drivers of Health (SDOH) Social History: SDOH Screenings   Food Insecurity: No Food Insecurity (11/04/2023)  Recent Concern: Food Insecurity - Food Insecurity Present (08/11/2023)  Housing: Low Risk  (11/04/2023)  Transportation Needs: No Transportation Needs (11/04/2023)  Utilities: Not At Risk (11/04/2023)  Depression (PHQ2-9): Low Risk  (03/17/2018)  Financial Resource Strain: Low Risk  (05/12/2023)   Received from [facility]  Physical Activity: Unknown (03/10/2023)   Received from [facility]  Social Connections: Moderately Isolated (11/04/2023)  Stress: No Stress Concern Present (03/10/2023)   Received [facility]  Tobacco Use: High Risk (11/04/2023)   SDOH Interventions: Food Insecurity Interventions: Inpatient TOC, Patient Declined   Readmission Risk Interventions    07/06/2023   11:09 AM  Readmission Risk Prevention Plan  Transportation  Screening Complete  HRI or Home Care Consult Complete  Social Work Consult for Recovery Care Planning/Counseling Complete  Palliative Care Screening Not Applicable  Medication Review Oceanographer) Referral to Pharmacy

## 2023-11-05 NOTE — Progress Notes (Signed)
 Progress Note   Patient: Maria Arroyo FMW:[id number] DOB: Dec 22, 1950 DOA: 11/04/2023  DOS: the patient was seen and examined on 11/05/2023   Brief hospital course:  73 y.o. 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   Assessment and Plan:  Acute hypoxic respiratory failure - Currently requiring 3-4 L nasal cannula to maintain O2 sats in the 90s.  Not much improvement since yesterday.  Continue to wean O2 as tolerated.  Acute exacerbation of HFrEF - Echo noting newly reduced EF 30-35%, biventricular reduced EF, severely dilated BAE.  Etiology likely worsening underlying CAD.  Cardiology/heart failure closely.  Minimal response to previous diuretics.  Increasing IV diuresis to Lasix  120 mg along with metolazone .  Monitor urine output closely.  Recheck BMP and magnesium  in AM.  NSTEMI with underlying CAD - Troponin elevation up to 1139.  Cardiology following closely.  Likely ongoing demand ischemia in the setting of worsening CHF exacerbation.  Underlying CAD with previous LAD stenting and probable stenosis of LAD.  Currently not pursuing interventional cardiac catheterization this time.  Acute kidney injury on CKD 4 - Much improvement.  Likely cardiorenal etiology.  Continue to monitor urine output.  Diuretics as above.  Recheck BMP and magnesium  in AM.  Metabolic alkalosis - Likely mixed in the setting of chronic hide hypoxic and hypercapnic respiratory failure related with diuresis.  May benefit from ongoing diuresis, could consider Diamox  as well.  Will recheck in  AM.  Diabetes mellitus - Insulin  sliding scale on board.  Iron  deficiency anemia - No overt bleeding.  No active bleeding appreciated.  Will recheck hemoglobin in AM.  Appears stable at this time.  History of squamous cell carcinoma of the right lung - S/p right lobectomy in 2020.  Tobacco abuse - Still smokes.  Encouraging smoking cessation.  Nicotine  patch offered.  Obesity class I - BMI nearly 32.  Encouraged lifestyle modification and weight loss.  Subjective: Patient resting comfortably this morning.  Only mild improvement from yesterday.  No complaint of any chest pain or pressure, worsening shortness of breath, nausea, vomiting, abdominal pain.  Physical Exam:  Vitals:   11/05/23 0023 11/05/23 0426 11/05/23 0740 11/05/23 0835  BP: 126/77 135/84  114/66  Pulse: 95   (!) 57  Resp: 19 18  19   Temp: 97.9 F (36.6 C) 97.6 F (36.4 C)  97.7 F (36.5 C)  TempSrc: Oral Oral  Oral  SpO2: 96% 97%  91%  Weight:  72.5 kg 71.2 kg   Height:        GENERAL:  Alert, pleasant, no acute distress  HEENT:  EOMI, nasal cannula CARDIOVASCULAR:  RRR, no murmurs appreciated RESPIRATORY: Poor air movement GASTROINTESTINAL:  Soft, nontender, nondistended EXTREMITIES: 2+ BL LE pitting edema NEURO:  No new focal deficits appreciated SKIN:  No rashes noted PSYCH:  Appropriate mood and affect     Data Reviewed:  Imaging Studies: US  EKG SITE RITE Result Date: 11/05/2023 If Site Rite image not attached, placement could not be confirmed due to current cardiac rhythm.  ECHOCARDIOGRAM COMPLETE Result Date: 11/04/2023    ECHOCARDIOGRAM REPORT   Patient Name:   Maria Arroyo Date of Exam: 11/04/2023 Medical Rec #:  [id number]                  Height:       59.0 in Accession #:    [id number]                 Weight:       155.4 lb Date of Birth:  [birth year]                  BSA:          1.657 m Patient Age:    72 years                   BP:           132/80 mmHg Patient Gender: F                           HR:           70 bpm. Exam Location:  Inpatient Procedure: 2D Echo, Color Doppler, Cardiac Doppler and Intracardiac            Opacification Agent (Both Spectral and Color Flow Doppler were            utilized during procedure). Indications:    I50.31 Acute diastolic (congestive) heart failure  History:        Patient has prior history of Echocardiogram examinations, most                 recent 08/27/2018. CHF, CAD, COPD; Risk Factors:Hypertension and                 Dyslipidemia.  Sonographer:    Damien Senior RDCS Referring Phys: [id number] EVA KATHEE PORE IMPRESSIONS  1. Left ventricular ejection fraction, by estimation, is 30 to 35%. The left ventricle has moderately decreased function. Left ventricular endocardial border not optimally defined to evaluate regional wall motion. The left ventricular internal cavity size was mildly dilated. Left ventricular diastolic parameters are consistent with Grade II diastolic dysfunction (pseudonormalization). Elevated left atrial pressure.  2. Right ventricular systolic function is moderately reduced. The right ventricular size is moderately enlarged.  3. Left atrial size was severely dilated.  4. Right atrial size was severely dilated.  5. The mitral valve is degenerative. Mild to moderate mitral valve regurgitation. No evidence of mitral stenosis. Moderate mitral annular calcification.  6. Tricuspid valve regurgitation is moderate.  7. The aortic valve is calcified. There is moderate calcification of the aortic valve. There is moderate thickening of the aortic valve. Aortic valve regurgitation is not visualized. Aortic valve sclerosis/calcification is present, without any evidence of aortic stenosis.  8. The inferior vena cava is normal in size with greater than 50% respiratory variability, suggesting right atrial pressure of 3 mmHg.  9. Very poor acoustical windows limit ability to detect focal wall motion abnormalities. The estimated EF may be  underestimated due to poor images even with definity  contrast. FINDINGS  Left Ventricle: Left ventricular ejection fraction, by estimation, is 30 to 35%. The left ventricle has moderately decreased function. Left ventricular endocardial border not optimally defined to evaluate regional wall motion. Definity  contrast agent was given IV to delineate the left ventricular endocardial borders. The left ventricular internal cavity size was mildly dilated. There is no left ventricular hypertrophy. Abnormal (paradoxical) septal motion, consistent with left bundle branch block. Left ventricular diastolic parameters are consistent with Grade II diastolic dysfunction (pseudonormalization). Elevated left atrial pressure. Right Ventricle:  The right ventricular size is moderately enlarged. No increase in right ventricular wall thickness. Right ventricular systolic function is moderately reduced. Left Atrium: Left atrial size was severely dilated. Right Atrium: Right atrial size was severely dilated. Pericardium: There is no evidence of pericardial effusion. Mitral Valve: The mitral valve is degenerative in appearance. There is mild thickening of the mitral valve leaflet(s). There is mild calcification of the mitral valve leaflet(s). Moderate mitral annular calcification. Mild to moderate mitral valve regurgitation. No evidence of mitral valve stenosis. Tricuspid Valve: The tricuspid valve is normal in structure. Tricuspid valve regurgitation is moderate . No evidence of tricuspid stenosis. Aortic Valve: The aortic valve is calcified. There is moderate calcification of the aortic valve. There is moderate thickening of the aortic valve. Aortic valve regurgitation is not visualized. Aortic valve sclerosis/calcification is present, without any  evidence of aortic stenosis. Pulmonic Valve: The pulmonic valve was normal in structure. Pulmonic valve regurgitation is trivial. No evidence of pulmonic stenosis. Aorta: The aortic root is  normal in size and structure. Venous: The inferior vena cava is normal in size with greater than 50% respiratory variability, suggesting right atrial pressure of 3 mmHg. IAS/Shunts: No atrial level shunt detected by color flow Doppler.  LEFT VENTRICLE PLAX 2D LVIDd:         5.80 cm      Diastology LVIDs:         5.10 cm      LV e' medial:    3.48 cm/s LV PW:         1.00 cm      LV E/e' medial:  17.4 LV IVS:        0.90 cm      LV e' lateral:   5.11 cm/s LVOT diam:     2.10 cm      LV E/e' lateral: 11.8 LV SV:         40 LV SV Index:   24 LVOT Area:     3.46 cm  LV Volumes (MOD) LV vol d, MOD A2C: 196.0 ml LV vol d, MOD A4C: 202.0 ml LV vol s, MOD A2C: 121.0 ml LV vol s, MOD A4C: 147.0 ml LV SV MOD A2C:     75.0 ml LV SV MOD A4C:     202.0 ml LV SV MOD BP:      67.3 ml RIGHT VENTRICLE RV S prime:     11.00 cm/s TAPSE (M-mode): 2.3 cm LEFT ATRIUM              Index        RIGHT ATRIUM           Index LA diam:        5.50 cm  3.32 cm/m   RA Area:     40.80 cm LA Vol (A2C):   103.0 ml 62.16 ml/m  RA Volume:   184.00 ml 111.05 ml/m LA Vol (A4C):   96.0 ml  57.94 ml/m LA Biplane Vol: 99.9 ml  60.29 ml/m  AORTIC VALVE LVOT Vmax:   85.60 cm/s LVOT Vmean:  53.000 cm/s LVOT VTI:    0.115 m  AORTA Ao Root diam: 3.10 cm Ao Asc diam:  3.00 cm MITRAL VALVE               TRICUSPID VALVE MV Area (PHT): 2.76 cm    TR Peak grad:   26.4 mmHg MV Decel Time: 275 msec    TR Vmax:  257.00 cm/s MV E velocity: 60.40 cm/s MV A velocity: 73.30 cm/s  SHUNTS MV E/A ratio:  0.82        Systemic VTI:  0.12 m                            Systemic Diam: 2.10 cm Wilbert Bihari MD Electronically signed by Wilbert Bihari MD Signature Date/Time: 11/04/2023/5:34:51 PM    Final    DG Ribs Unilateral Right Result Date: 11/04/2023 CLINICAL DATA:  Fall with persistent right rib pain. EXAM: RIGHT RIBS - 2 VIEW COMPARISON:  Chest radiograph earlier today, chest CT 03/03/2022 FINDINGS: There are minimally displaced fractures of posterolateral sixth  and seventh ribs, as well as anterior tenth rib. Fracture of anterior right ninth rib has surrounding callus formation and is likely subacute or chronic. Right pleural effusion is again seen, similar to radiograph earlier today. No pneumothorax. IMPRESSION: 1. Minimally displaced fractures of posterolateral right sixth and seventh ribs, as well as anterior tenth rib. 2. Fracture of anterior right ninth rib has surrounding callus formation and is likely subacute or chronic. 3. Right pleural effusion, similar to radiograph earlier today. Electronically Signed   By: Andrea Gasman M.D.   On: 11/04/2023 14:16   DG Chest Port 1 View Result Date: 11/04/2023 CLINICAL DATA:  Shortness of breath. EXAM: PORTABLE CHEST 1 VIEW COMPARISON:  Portable chest 08/11/2023 FINDINGS: 4:51 a.m. The heart is enlarged. There is increased central vascular prominence, increased mild diffuse interstitial edema. There is an increased now moderate right pleural effusion a small left pleural effusion. There is overlying atelectasis or consolidation in the right lower lung field. Patchy perihilar hazy opacity is also seen and probably due to ground-glass edema. The aorta is tortuous and calcified, with stable mediastinum. Partially visible fusion plating lower cervical spine is again shown. No new osseous abnormality. There is degenerative change of the thoracic spine. IMPRESSION: 1. Cardiomegaly with increased central vascular prominence and mild diffuse interstitial edema. 2. Increased now moderate right pleural effusion and small left pleural effusion. 3. Overlying atelectasis or consolidation in the right lower lung field. 4. Patchy perihilar hazy opacity is also seen and probably due to ground-glass edema. Electronically Signed   By: Francis Quam M.D.   On: 11/04/2023 05:07    As above  Previous records (including but not limited to H&P, progress notes, nursing notes, TOC management) were reviewed in assessment of this  patient.  Labs: CBC: Recent Labs  Lab 11/04/23 0439 11/04/23 0450 11/05/23 0233  WBC 11.0*  --  8.0  HGB 8.8* 10.2* 8.6*  HCT 29.5* 30.0* 29.2*  MCV 88.3  --  87.7  PLT 266  --  250   Basic Metabolic Panel: Recent Labs  Lab 11/04/23 0439 11/04/23 0450 11/05/23 0233  NA 135 133* 137  K 5.0 4.9 4.9  CL 88*  --  87*  CO2 33*  --  37*  GLUCOSE 169*  --  143*  BUN 53*  --  59*  CREATININE 2.26*  --  2.39*  CALCIUM  9.0  --  9.1  MG 2.7*  --   --    Liver Function Tests: Recent Labs  Lab 11/04/23 1412  AST 241*  ALT 309*  ALKPHOS 159*  BILITOT 0.7  PROT 6.4*  ALBUMIN 2.7*   CBG: Recent Labs  Lab 11/04/23 2138 11/05/23 0633  GLUCAP 199* 136*    Scheduled Meds:  amiodarone   200 mg Oral  Daily   busPIRone   15 mg Oral BID   Chlorhexidine  Gluconate Cloth  6 each Topical Daily   ferrous sulfate   325 mg Oral Daily   hydrALAZINE   25 mg Oral TID   insulin  aspart  0-9 Units Subcutaneous TID WC   isosorbide  dinitrate  10 mg Oral TID   lidocaine   1 patch Transdermal Q24H   nicotine   21 mg Transdermal Daily   sodium chloride  flush  10-40 mL Intracatheter Q12H   sodium chloride  flush  3 mL Intravenous Q12H   venlafaxine  XR  150 mg Oral Daily   Continuous Infusions:  furosemide  120 mg (11/05/23 1132)   heparin  1,150 Units/hr (11/05/23 1128)   milrinone      PRN Meds:.acetaminophen  **OR** acetaminophen , albuterol , HYDROcodone -acetaminophen , melatonin, mouth rinse, sodium chloride  flush  Family Communication: None at bedside  Disposition: Status is: Inpatient Remains inpatient appropriate because: See above     Time spent: 53 minutes  Length of inpatient stay: 1 days  Author: Carliss LELON Canales, DO 11/05/2023 11:34 AM  For on call review [email or web address].

## 2023-11-05 NOTE — Progress Notes (Addendum)
 PHARMACY - ANTICOAGULATION CONSULT NOTE  Pharmacy Consult for heparin  Indication: NSTEMI  Allergies  Allergen Reactions   Ace Inhibitors Other (See Comments)    Worsening renal function    Codeine Nausea Only    Patient Measurements: Height: 4' 11 (149.9 cm) Weight: 71.2 kg (156 lb 15.5 oz) IBW/kg (Calculated) : 43.2 HEPARIN  DW (KG): 59  Vital Signs: Temp: 98.2 F (36.8 C) (08/08 1210) Temp Source: Oral (08/08 1210) BP: 140/78 (08/08 1210) Pulse Rate: 63 (08/08 1210)  Labs: Recent Labs    11/04/23 0439 11/04/23 0450 11/04/23 0645 11/04/23 1153 11/04/23 1412 11/04/23 1805 11/05/23 0233 11/05/23 1228  HGB 8.8* 10.2*  --   --   --   --  8.6*  --   HCT 29.5* 30.0*  --   --   --   --  29.2*  --   PLT 266  --   --   --   --   --  250  --   APTT  --   --   --   --   --  49* 55* 72*  HEPARINUNFRC  --   --   --   --   --  >1.10* >1.10* >1.10*  CREATININE 2.26*  --   --   --   --   --  2.39*  --   TROPONINIHS 664*  --  758* 956* 1,139*  --   --   --     Estimated Creatinine Clearance: 18.3 mL/min (A) (by C-G formula based on SCr of 2.39 mg/dL (H)).   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   11/03/2023 Morning   albuterol  (VENTOLIN  HFA) 108 (90 Base) MCG/ACT inhaler Inhale 2 puffs into the lungs every 6 (six) hours as needed for shortness of breath or wheezing.   11/03/2023 Noon    amiodarone  (PACERONE ) 200 MG tablet Take 1 tablet (200 mg total) by mouth daily. 90 tablet 3 11/03/2023 Morning   apixaban  (ELIQUIS ) 5 MG TABS tablet Take 1 tablet (5 mg total) by mouth 2 (two) times daily. 180 tablet 3 11/03/2023 at  8:30 PM   Ascorbic Acid (VITAMIN C PO) Take 1 tablet by mouth daily.   11/03/2023 Morning   busPIRone  (BUSPAR ) 15 MG tablet Take 15 mg by mouth 2 (two) times daily.   11/03/2023 Bedtime   Cholecalciferol  (VITAMIN D) 2000 UNITS tablet Take 2,000 Units by mouth daily.   11/03/2023 Morning   gabapentin  (NEURONTIN ) 600 MG tablet Take 600 mg by mouth in the morning, at noon, in the evening, and at bedtime.   11/03/2023 Evening   hydrALAZINE  (APRESOLINE ) 25 MG tablet Take 25 mg by mouth 3 (three) times daily.   11/03/2023 at  9:30 PM   Iron , Ferrous Sulfate ,  325 (65 Fe) MG TABS Take 1 tablet by mouth daily. 30 tablet 3 11/03/2023 Morning   isosorbide  dinitrate (ISORDIL ) 10 MG tablet Take 1 tablet (10 mg total) by mouth 3 (three) times daily. 90 tablet 1 11/03/2023 at  9:30 PM   Misc. Devices MISC Inhale 3 L into the lungs continuous.   11/03/2023   Multiple Vitamin (MULTIVITAMIN WITH MINERALS) TABS tablet Take 1 tablet by mouth daily. 30 tablet 0 11/03/2023 Morning   nicotine  (NICODERM CQ  - DOSED IN MG/24 HOURS) 21 mg/24hr patch Place 1 patch (21 mg total) onto the skin daily. 28 patch 0 11/03/2023 Morning   nitroGLYCERIN  (NITROSTAT ) 0.4 MG SL tablet PLACE 1 TABLET UNDER THE TONGUE EVERY 5 MINUTES AS NEEDED CHEST PAIN. 25 tablet 1 Unknown   potassium chloride  SA (KLOR-CON  M) 20 MEQ tablet Take 20 mEq by mouth daily.   11/03/2023 Morning   rosuvastatin  (CRESTOR ) 40 MG tablet TAKE 1 TABLET BY MOUTH DAILY 90 tablet 3 11/03/2023 Morning   torsemide  (DEMADEX ) 20 MG tablet Take 1 tablet (20 mg total) by mouth daily.   11/03/2023 Morning   venlafaxine  XR (EFFEXOR -XR) 150 MG 24 hr capsule Take 150 mg by mouth daily.  0 11/03/2023 Morning    Assessment: 72y.o. with history of Afib on Eliquis  PTA (last dose 8/6  PM) who presents with CHF exacerbation.  Pharmacy consulted for heparin  dosing.  aPTT 72 sec is therapeutic on 1150 units/hr.  Heparin  level >1.1 - still impacted by PTA DOAC use.   Goal of Therapy:  Heparin  level 0.3-0.7 units/ml aPTT 66-102 seconds Monitor platelets by anticoagulation protocol: Yes   Plan:  Continue heparin  IV 1150 units/hr Daily CBC, heparin  level, aPTT until correlating F/u intervention plans, ability to switch back to DOAC  Maurilio Fila, PharmD Clinical Pharmacist 11/05/2023  2:09 PM

## 2023-11-05 NOTE — Progress Notes (Signed)
 Progress Note  Patient Name: Maria Arroyo Date of Encounter: 11/05/2023 [facility] Cardiologist: Peter Swazil[place], MD   Interval Summary   Unfortunately very little improvement overnight, despite negative fluid balance 800 mL.  Remains orthopneic.  Denies angina pectoris.  Has positional musculoskeletal discomfort throughout her entire right side, back and flank. Mediocre urine output despite combined metolazone  and intravenous furosemide . Echocardiogram shows new marked reduction in left ventricular systolic function with EF down to 30-35%, without significant valvular abnormalities.  Mitral inflow pattern consistent with elevated mean left atrial pressure.  Severe biatrial dilation, but remains in sinus rhythm.  Vital Signs Vitals:   11/04/23 2010 11/05/23 0023 11/05/23 0426 11/05/23 0740  BP:  126/77 135/84   Pulse:  95    Resp:  19 18   Temp: 98.2 F (36.8 C) 97.9 F (36.6 C) 97.6 F (36.4 C)   TempSrc: Oral Oral Oral   SpO2: 93% 96% 97%   Weight:   72.5 kg 71.2 kg  Height:        Intake/Output Summary (Last 24 hours) at 11/05/2023 0826 Last data filed at 11/05/2023 0640 Gross per 24 hour  Intake 536.74 ml  Output 1350 ml  Net -813.26 ml      11/05/2023    7:40 AM 11/05/2023    4:26 AM 11/04/2023    7:57 AM  Last 3 Weights  Weight (lbs) 156 lb 15.5 oz 159 lb 13.3 oz 155 lb 6.8 oz  Weight (kg) 71.2 kg 72.5 kg 70.5 kg      Telemetry/ECG  Sinus rhythm- Personally Reviewed  Reviewed the echo.  Wall motion analysis is difficult since there is also a left bundle branch block and there is abnormal septal motion consistent with right ventricular volume overload, but I believe that the inferior wall and the inferior septum are severely hypokinetic/akinetic.  In addition there appears to be some anteroseptal hypokinesis as well.  Overall findings suggest multivessel coronary artery disease.  In my opinion the tricuspid regurgitation is severe.  The mitral  insufficiency is at least moderate, probably moderate to severe.  Physical Exam  GEN: No acute distress.   Neck:  JVD to the angle of the jaw Cardiac: RRR, no diastolic murmurs, rubs, or gallops.  2/6 holosystolic murmur at the left lower sternal border faint apical holosystolic murmur. Respiratory: Clear to auscultation bilaterally. GI: Soft, nontender, non-distended  MS: No edema  Assessment & Plan  Acute combined systolic and diastolic heart failure: New reduction in LVEF and dilation of the left ventricle compared with previous evaluations.  Regional wall motion abnormalities appear to be present, consistent with multivessel CAD.  She appears to be in a low cardiac output state with high risk of progression to cardiorenal syndrome.  Plan to place a PICC line and consult the heart failure service.  She will need inotropic support. CAD: Mild elevation in high-sensitivity troponin is more consistent with demand injury from heart failure exacerbation, rather than any new acute atherothrombotic event.  However the echo suggest progression of coronary disease.  Suspect occlusion of the right coronary artery, probably high-grade stenosis of the LAD artery.  Currently not a candidate for coronary angiography. 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  Currently with reasonable urine output.  I do not think she would be a good candidate for chronic hemodialysis due to her multiple comorbid conditions Paroxysmal A-fib: Currently in sinus rhythm, but at risk for deterioration to atrial fibrillation in the setting of  heart failure exacerbation and use of inotropes.  She is fully anticoagulated.  Continue amiodarone  (can switch this to IV if atrial fibrillation occurs). COPD/history of lobectomy for lung cancer: Worsens problems with respiratory insufficiency. DM/HLP: Most recent lipid profile shows all parameters in target range (LDL 60) with current regimen.  Recent hemoglobin A1c  shows satisfactory glycemic control at 7.0%. Anemia: Stable in the 8.6-8.8 range; labs from April consistent with iron  deficiency.  No overt active bleeding.  Negative Hemoccult in April and extensive GI workup that has included EGD, colonoscopy, capsule enteroscopy in April 2025, negative for source of bleeding.   For questions or updates, please contact [facility] Please consult [email or web address] for contact info under       Signed, Jerel Balding, MD

## 2023-11-05 NOTE — Progress Notes (Signed)
 PHARMACY - ANTICOAGULATION CONSULT NOTE  Pharmacy Consult for heparin  Indication: NSTEMI  Allergies  Allergen Reactions   Ace Inhibitors Other (See Comments)    Worsening renal function    Codeine Nausea Only    Patient Measurements: Height: 4' 11 (149.9 cm) Weight: 70.5 kg (155 lb 6.8 oz) IBW/kg (Calculated) : 43.2 HEPARIN  DW (KG): 59  Vital Signs: Temp: 97.9 F (36.6 C) (08/08 0023) Temp Source: Oral (08/08 0023) BP: 126/77 (08/08 0023) Pulse Rate: 95 (08/08 0023)  Labs: Recent Labs    11/04/23 0439 11/04/23 0450 11/04/23 0645 11/04/23 1153 11/04/23 1412 11/04/23 1805 11/05/23 0233  HGB 8.8* 10.2*  --   --   --   --  8.6*  HCT 29.5* 30.0*  --   --   --   --  29.2*  PLT 266  --   --   --   --   --  250  APTT  --   --   --   --   --  49* 55*  HEPARINUNFRC  --   --   --   --   --  >1.10* >1.10*  CREATININE 2.26*  --   --   --   --   --  2.39*  TROPONINIHS 664*  --  758* 956* 1,139*  --   --     Estimated Creatinine Clearance: 18.2 mL/min (A) (by C-G formula based on SCr of 2.39 mg/dL (H)).   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   11/03/2023 Morning   albuterol  (VENTOLIN  HFA) 108 (90 Base) MCG/ACT inhaler Inhale 2 puffs into the lungs every 6 (six) hours as needed for shortness of breath or wheezing.   11/03/2023 Noon   amiodarone  (PACERONE ) 200 MG tablet Take 1 tablet (200  mg total) by mouth daily. 90 tablet 3 11/03/2023 Morning   apixaban  (ELIQUIS ) 5 MG TABS tablet Take 1 tablet (5 mg total) by mouth 2 (two) times daily. 180 tablet 3 11/03/2023 at  8:30 PM   Ascorbic Acid (VITAMIN C PO) Take 1 tablet by mouth daily.   11/03/2023 Morning   busPIRone  (BUSPAR ) 15 MG tablet Take 15 mg by mouth 2 (two) times daily.   11/03/2023 Bedtime   Cholecalciferol  (VITAMIN D) 2000 UNITS tablet Take 2,000 Units by mouth daily.   11/03/2023 Morning   gabapentin  (NEURONTIN ) 600 MG tablet Take 600 mg by mouth in the morning, at noon, in the evening, and at bedtime.   11/03/2023 Evening   hydrALAZINE  (APRESOLINE ) 25 MG tablet Take 25 mg by mouth 3 (three) times daily.   11/03/2023 at  9:30 PM   Iron , Ferrous Sulfate , 325 (65 Fe) MG TABS Take 1 tablet by mouth daily. 30 tablet 3 11/03/2023 Morning   isosorbide   dinitrate (ISORDIL ) 10 MG tablet Take 1 tablet (10 mg total) by mouth 3 (three) times daily. 90 tablet 1 11/03/2023 at  9:30 PM   Misc. Devices MISC Inhale 3 L into the lungs continuous.   11/03/2023   Multiple Vitamin (MULTIVITAMIN WITH MINERALS) TABS tablet Take 1 tablet by mouth daily. 30 tablet 0 11/03/2023 Morning   nicotine  (NICODERM CQ  - DOSED IN MG/24 HOURS) 21 mg/24hr patch Place 1 patch (21 mg total) onto the skin daily. 28 patch 0 11/03/2023 Morning   nitroGLYCERIN  (NITROSTAT ) 0.4 MG SL tablet PLACE 1 TABLET UNDER THE TONGUE EVERY 5 MINUTES AS NEEDED CHEST PAIN. 25 tablet 1 Unknown   potassium chloride  SA (KLOR-CON  M) 20 MEQ tablet Take 20 mEq by mouth daily.   11/03/2023 Morning   rosuvastatin  (CRESTOR ) 40 MG tablet TAKE 1 TABLET BY MOUTH DAILY 90 tablet 3 11/03/2023 Morning   torsemide  (DEMADEX ) 20 MG tablet Take 1 tablet (20 mg total) by mouth daily.   11/03/2023 Morning   venlafaxine  XR (EFFEXOR -XR) 150 MG 24 hr capsule Take 150 mg by mouth daily.  0 11/03/2023 Morning    Assessment: 73y.o. with increased SOB and worsening edema, mildly elevated troponin (335>241). Is on eliquis  PTA, last dose  was 8/6 at 2030. Hgb 10.2, Plts 266. Will forgo bolus and start with infusion since already anticoagulated.  Heparin  level elevated due to apixaban . PTT came back subtherapeutic. We will increase rate and check in AM.   8/8 AM update:  aPTT sub-therapeutic   Goal of Therapy:  Heparin  level 0.3-0.7 units/ml aPTT 66-102 seconds Monitor platelets by anticoagulation protocol: Yes   Plan:  Increase heparin  infusion to 1150 units/hr Heparin  level and aPTT in 8 hours  Lynwood Mckusick, PharmD, BCPS Clinical Pharmacist Phone: [phone]

## 2023-11-05 NOTE — Hospital Course (Addendum)
 73 y.o. 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    Assessment and Plan:   Acute metabolic encephalopathy - Seems more lethargic this morning.  Cardiology ordering stat ABG which showed hypercapnia and metabolic alkalemia.  See below.  Will monitor closely.   Acute hypoxic and hypercapnic respiratory failure - Currently requiring 3L nasal cannula (pt's baseline).  Patient more lethargic this morning, ABG ordered subsequently showing 7.55/71/71.  Given hypercapnia, will place on BiPAP for short time.  Continue to wean O2 as tolerated.  O2 saturation goal 88-92.   Acute exacerbation of HFrEF - Echo noting newly reduced EF 30-35%, biventricular reduced EF, severely dilated BAE.  Etiology likely worsening underlying CAD.  Cardiology/heart failure closely.  Showing markedly improved response to increase of diuretics yesterday, diuresing greater than 10 L total since presentation.  Starting to appear intravascularly dry.  Holding IV Lasix .  Weaning/discontinuing milrinone  per cardiology.  Monitor urine output closely.  Recheck BMP and magnesium  in AM.   NSTEMI with underlying CAD - Troponin elevation up to 1139 downtrending this morning..  Cardiology following closely.  Likely ongoing demand ischemia in the setting of worsening CHF exacerbation.  Underlying CAD with previous LAD stenting and probable stenosis of LAD.  Currently not pursuing interventional cardiac catheterization this time due to kidney dysfunction.   Acute kidney injury on CKD 4 - Creatinine similar to yesterday, likely at  patient's baseline.  Continue to monitor urine output.  Diuretics on hold today.  Recheck BMP and magnesium  in AM.   Mixed acid-base disorder - ABG this morning 7.55/71/71.  Likely mixed in the setting of chronic hypercapnic respiratory failure and contraction alkalosis related with diuresis.  Bicarb continues to be greater than 40 this morning.  Will initiate NS at 50 mL/h.  Initiate Diamox  500 mg IV twice daily.  Recheck BMP in AM.   Hypokalemia - Exacerbated by diuresis.  Aggressive replenishment on board.  Will recheck BMP in AM.   Transaminitis - Likely from congestion of HFrEF.  LFTs showing improvement this morning.  Will recheck in AM.   Diabetes mellitus - Insulin  sliding scale on board.   Iron  deficiency anemia - No overt bleeding.  No active bleeding appreciated.  Showing initial downtrend, improved this morning however.  Will transfuse if hemoglobin less than 8 secondary to cardiac history above.  Will recheck hemoglobin in AM.  Appears stable at this time.   Chronic atrial fibrillation - Amiodarone  on board.  Transition to Eliquis  p.o. today.   History of squamous cell carcinoma of the right lung - S/p right lobectomy in 2020.   Tobacco abuse - Still smokes.  Encouraging smoking cessation.  Nicotine  patch offered.   Obesity class I - BMI nearly 32.  Encouraged lifestyle modification and weight loss.

## 2023-11-05 NOTE — Progress Notes (Signed)
 Peripherally Inserted Central Catheter Placement  The IV Nurse has discussed with the patient and/or persons authorized to consent for the patient, the purpose of this procedure and the potential benefits and risks involved with this procedure.  The benefits include less needle sticks, lab draws from the catheter, and the patient may be discharged home with the catheter. Risks include, but not limited to, infection, bleeding, blood clot (thrombus formation), and puncture of an artery; nerve damage and irregular heartbeat and possibility to perform a PICC exchange if needed/ordered by physician.  Alternatives to this procedure were also discussed.  Bard Power PICC patient education guide, fact sheet on infection prevention and patient information card has been provided to patient /or left at bedside.    PICC Placement Documentation  PICC Double Lumen 11/05/23 Left Brachial 40 cm 0 cm (Active)  Indication for Insertion or Continuance of Line Vasoactive infusions 11/05/23 1100  Exposed Catheter (cm) 0 cm 11/05/23 1100  Site Assessment Clean, Dry, Intact 11/05/23 1100  Lumen #1 Status Flushed;Saline locked;Blood return noted 11/05/23 1100  Lumen #2 Status Flushed;Saline locked;Blood return noted 11/05/23 1100  Dressing Type Transparent;Securing device 11/05/23 1100  Dressing Status Antimicrobial disc/dressing in place;Clean, Dry, Intact 11/05/23 1100  Line Care Connections checked and tightened 11/05/23 1100  Line Adjustment (NICU/IV Team Only) No 11/05/23 1100  Dressing Intervention New dressing;Adhesive placed at insertion site (IV team only) 11/05/23 1100  Dressing Change Due 11/12/23 11/05/23 1100       Maria Arroyo 11/05/2023, 11:15 AM

## 2023-11-05 NOTE — Consult Note (Addendum)
 Advanced Heart Failure Team Consult Note   Primary Physician: Aminta Lamar Hamilton, MD Cardiologist:  Peter Swaziland, MD  Reason for Consultation: acute combined systolic and diastolic heart failure, suspected low output  HPI:    Maria Arroyo is seen today for evaluation of acute combined systolic and diastolic heart failure and suspected low output at the request of Dr. Francyne.   [name] is a 73 y.o. female with CAD s/p multiple PCI, HTN, HLD, PAF, chronic diastolic heart failure, chronic respiratory failure, DM II, Squamous cell carcinoma s/p R lobectomy 20', tobacco abuse and depression.  She presented to the ED after progressive worsening BLE edema and SOB that began around June. She had a fall 3 days prior to admission, now with back pain but stable. Reports compliance with medications. She stopped smoking around June when she first noticed symptoms. Reports decreased appetite and abd fullness. + orthopnea. Reports her dad had CAD as well. Mom with heart issues but unsure of what.   Admission labs reviewed: BNP 1.9K, SCr 2.26, K 5, Mg 2.7, HsTrop 664>758> 956> 1.1K. Diuresis has been started but poor response noted. She feels somewhat better but still knows she's not where she needs to be. She tells me that she's not ready to die. AHF team was consulted today with worsening renal function and poor UOP despite IV lasix  and metolazone .   Cardiac studies reviewed:  - s/p stenting of the mid RCA 02/1998. Proximal RCA was stented in 12/2000.03/2009 she had a new high-grade stenosis in the mid RCA between the prior stents, this was stented.  - CT Chest 7/21 with 3v CAD - LHC 2011 with nonobs CAD. Stents widely patent.  - LHC 2013 unchanged from 2011 - Echo 5/20: EF 55-60%, RV mod reduced, mild-mod MR - Echo 11/04/23: EF 30-35%, GIIDD, RV mod reduced, RA/LA severely enlarged. Mild-Mod MR, mod TR.   Home Medications Prior to Admission medications    Medication Sig Start Date End Date Taking? Authorizing Provider  acetaminophen  (TYLENOL ) 325 MG tablet Take 650 mg by mouth every 4 (four) hours as needed for mild pain (pain score 1-3).   Yes [provider]  albuterol  (VENTOLIN  HFA) 108 (90 Base) MCG/ACT inhaler Inhale 2 puffs into the lungs every 6 (six) hours as needed for shortness of breath or wheezing. 02/17/23  Yes [provider]  amiodarone  (PACERONE ) 200 MG tablet Take 1 tablet (200 mg total) by mouth daily. 12/09/22  Yes Duke, Jon Garre, PA  apixaban  (ELIQUIS ) 5 MG TABS tablet Take 1 tablet (5 mg total) by mouth 2 (two) times daily. 12/09/22  Yes Duke, Jon Garre, PA  Ascorbic Acid (VITAMIN C PO) Take 1 tablet by mouth daily.   Yes [provider]  busPIRone  (BUSPAR ) 15 MG tablet Take 15 mg by mouth 2 (two) times daily.   Yes [provider]  Cholecalciferol  (VITAMIN D) 2000 UNITS tablet Take 2,000 Units by mouth daily.   Yes [provider]  gabapentin  (NEURONTIN ) 600 MG tablet Take 600 mg by mouth in the morning, at noon, in the evening, and at bedtime. 09/23/23  Yes [provider]  hydrALAZINE  (APRESOLINE ) 25 MG tablet Take 25 mg by mouth 3 (three) times daily. 10/18/23  Yes [provider]  Iron , Ferrous Sulfate , 325 (65 Fe) MG TABS Take 1 tablet by mouth daily. 07/06/23  Yes Francella Rogue, MD  isosorbide  dinitrate (ISORDIL ) 10 MG tablet Take 1 tablet (10 mg total) by mouth 3 (three)  times daily. 05/16/23  Yes Sebastian Toribio GAILS, MD  Misc. Devices MISC Inhale 3 L into the lungs continuous. 08/30/23  Yes [provider]  Multiple Vitamin (MULTIVITAMIN WITH MINERALS) TABS tablet Take 1 tablet by mouth daily. 08/30/18  Yes Briana Avena D, MD  nicotine  (NICODERM CQ  - DOSED IN MG/24 HOURS) 21 mg/24hr patch Place 1 patch (21 mg total) onto the skin daily. 08/14/23  Yes Singh, Prashant K, MD  nitroGLYCERIN  (NITROSTAT ) 0.4 MG SL tablet PLACE 1 TABLET UNDER THE TONGUE EVERY  5 MINUTES AS NEEDED CHEST PAIN. 05/25/22  Yes Swaziland, Peter M, MD  potassium chloride  SA (KLOR-CON  M) 20 MEQ tablet Take 20 mEq by mouth daily. 08/30/23  Yes [provider]  rosuvastatin  (CRESTOR ) 40 MG tablet TAKE 1 TABLET BY MOUTH DAILY 12/09/22  Yes Duke, Jon Garre, PA  torsemide  (DEMADEX ) 20 MG tablet Take 1 tablet (20 mg total) by mouth daily. 08/16/23  Yes Singh, Prashant K, MD  venlafaxine  XR (EFFEXOR -XR) 150 MG 24 hr capsule Take 150 mg by mouth daily.   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 11/07/2018   COPD (chronic obstructive pulmonary disease) (HCC)    Coronary artery disease    Depression    Gout    R foot and knee   Hyperlipidemia    Hypertension    Left foot drop    Neuropathy    OA (osteoarthritis)    P[place]    Tobacco abuse     Past Surgical History: Past Surgical History:  Procedure Laterality Date   ABDOMINAL HYSTERECTOMY     ANTERIOR CERVICAL DECOMP/DISCECTOMY FUSION  02/06/2011   Procedure: ANTERIOR CERVICAL DECOMPRESSION/DISCECTOMY FUSION 2 LEVELS;  Surgeon: Rockey LITTIE Peru;  Location: [facility];  Service: Neurosurgery;  Laterality: N/A;  Anterior Cervical Four-Five/Five-Six Decompression and Fusion with Plating and Bonegraft   BLADDER SURGERY     BONE BIOPSY  07/04/2023   Procedure: BIOPSY, GASTRIC;  Surgeon: Leigh Elspeth SQUIBB, MD;  Location: [facility] ENDOSCOPY;  Service: Gastroenterology;;   CARDIAC CATHETERIZATION  04/09/2009   EF 60%   CARDIAC CATHETERIZATION  03/24/2001   EF 55%   CARDIAC CATHETERIZATION  01/06/2001   EF 65%   CHOLECYSTECTOMY     COLONOSCOPY N/A 07/04/2023   Procedure: COLONOSCOPY;  Surgeon: Leigh Elspeth SQUIBB, MD;  Location: [facility] ENDOSCOPY;  Service: Gastroenterology;  Laterality: N/A;   CORONARY STENT PLACEMENT  03/2009   STENTING OF THE PROXIMAL TO MID RIGHT CORONARY    ESOPHAGOGASTRODUODENOSCOPY N/A 07/04/2023   Procedure: EGD (ESOPHAGOGASTRODUODENOSCOPY);  Surgeon: Leigh Elspeth SQUIBB, MD;  Location: [facility] ENDOSCOPY;  Service: Gastroenterology;  Laterality: N/A;   GIVENS CAPSULE STUDY N/A 07/04/2023   Procedure: IMAGING PROCEDURE, GI TRACT, INTRALUMINAL, VIA CAPSULE;  Surgeon: Leigh Elspeth SQUIBB, MD;  Location: [facility] ENDOSCOPY;  Service: Gastroenterology;  Laterality: N/A;   IR THORACENTESIS ASP PLEURAL SPACE W/IMG GUIDE  06/01/2018   LEFT HEART CATHETERIZATION WITH CORONARY ANGIOGRAM N/A 12/21/2011   Procedure: LEFT HEART CATHETERIZATION WITH CORONARY ANGIOGRAM;  Surgeon: Peter M Swaziland, MD;  Location: [facility] CATH LAB;  Service: Cardiovascular;  Laterality: N/A;   LOBECTOMY     NODE DISSECTION N/A 04/04/2018   Procedure: NODE DISSECTION;  Surgeon: Kerrin Elspeth BROCKS, MD;  Location: [facility] OR;  Service: Thoracic;  Laterality: N/A;   OTHER SURGICAL HISTORY  12/2014  R foot cyst removal   VIDEO ASSISTED THORACOSCOPY (VATS)/ LOBECTOMY Right 04/04/2018   Procedure: VIDEO ASSISTED THORACOSCOPY (VATS)/RIGHT LOWER LOBECTOMY;  Surgeon: Kerrin Elspeth BROCKS, MD;  Location: [facility] OR;  Service: Thoracic;  Laterality: Right;    Family History: Family History  Problem Relation Age of Onset   Aneurysm Mother    Heart attack Father    Heart failure Father    Stroke Sister    Heart attack Brother     Social History: Social History   Socioeconomic History   Marital status: Married    Spouse name: Not on file   Number of children: 2   Years of education: 10   Highest education level: Not on file  Occupational History   Occupation: housewife    Employer: UNEMPLOYED  Tobacco Use   Smoking status: Every Day    Current packs/day: 2.00    Average packs/day: 2.0 packs/day for 35.0 years (70.0 ttl pk-yrs)    Types: Cigarettes   Smokeless tobacco: Never   Tobacco comments:    1-1.5 ppd (10/02/13), 03/17/18 2 PPD  Vaping Use   Vaping status: Never Used  Substance and Sexual  Activity   Alcohol use: No    Alcohol/week: 0.0 standard drinks of alcohol   Drug use: No   Sexual activity: Not on file  Other Topics Concern   Not on file  Social History Narrative   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   The patient has a 10th grade education.   The patient is left-handed.   The patient has two children.   Patient drinks three cups of soda daily.       Social Drivers of Corporate investment banker Strain: Low Risk  (05/12/2023)   Received from [facility]   Overall Financial Resource Strain (CARDIA)    Difficulty of Paying Living Expenses: Not hard at all  Food Insecurity: No Food Insecurity (11/04/2023)   Hunger Vital Sign    Worried About Running Out of Food in the Last Year: Never true    Ran Out of Food in the Last Year: Never true  Recent Concern: Food Insecurity - Food Insecurity Present (08/11/2023)   Hunger Vital Sign    Worried About Running Out of Food in the Last Year: Never true    Ran Out of Food in the Last Year: Sometimes true  Transportation Needs: No Transportation Needs (11/04/2023)   PRAPARE - Administrator, Civil Service (Medical): No    Lack of Transportation (Non-Medical): No  Physical Activity: Unknown (03/10/2023)   Received from [facility]   Exercise Vital Sign    On average, how many days per week do you engage in moderate to strenuous exercise (like a brisk walk)?: 0 days    Minutes of Exercise per Session: Not on file  Stress: No Stress Concern Present (03/10/2023)   Received from [facility] - Occupational Stress Questionnaire    Feeling of Stress : Not at all  Social Connections: Moderately Isolated (11/04/2023)   Social Connection and Isolation Panel    Frequency of Communication with Friends and Family: More than three times a week    Frequency of Social Gatherings with Friends and Family: More than three times a week    Attends Religious  Services: Never    Database administrator or Organizations: No    Attends Banker Meetings: Never    Marital  Status: Married    Allergies:  Allergies  Allergen Reactions   Ace Inhibitors Other (See Comments)    Worsening renal function    Codeine Nausea Only    Objective:    Vital Signs:   Temp:  [97.6 F (36.4 C)-98.2 F (36.8 C)] 97.6 F (36.4 C) (08/08 0426) Pulse Rate:  [66-95] 95 (08/08 0023) Resp:  [17-19] 18 (08/08 0426) BP: (117-135)/(77-84) 135/84 (08/08 0426) SpO2:  [93 %-97 %] 97 % (08/08 0426) Weight:  [71.2 kg-72.5 kg] 71.2 kg (08/08 0740) Last BM Date : 11/03/23  Weight change: Filed Weights   11/04/23 0757 11/05/23 0426 11/05/23 0740  Weight: 70.5 kg 72.5 kg 71.2 kg    Intake/Output:   Intake/Output Summary (Last 24 hours) at 11/05/2023 0824 Last data filed at 11/05/2023 0640 Gross per 24 hour  Intake 536.74 ml  Output 1350 ml  Net -813.26 ml    Physical Exam  General:  frail appearing.  No respiratory difficulty Neck: JVD to ear.  Cor: Regular rate & rhythm. No murmurs. Lungs: clear, coarse bases Extremities: +2-3 BLE edema Neuro: alert & oriented x 3. Affect pleasant.  Telemetry   NSR 60s (Personally reviewed)    EKG    NSR 70s, PR 233, Qtc 533  Labs   Basic Metabolic Panel: Recent Labs  Lab 11/04/23 0439 11/04/23 0450 11/05/23 0233  NA 135 133* 137  K 5.0 4.9 4.9  CL 88*  --  87*  CO2 33*  --  37*  GLUCOSE 169*  --  143*  BUN 53*  --  59*  CREATININE 2.26*  --  2.39*  CALCIUM  9.0  --  9.1  MG 2.7*  --   --     Liver Function Tests: Recent Labs  Lab 11/04/23 1412  AST 241*  ALT 309*  ALKPHOS 159*  BILITOT 0.7  PROT 6.4*  ALBUMIN 2.7*   No results for input(s): LIPASE, AMYLASE in the last 168 hours. No results for input(s): AMMONIA in the last 168 hours.  CBC: Recent Labs  Lab 11/04/23 0439 11/04/23 0450 11/05/23 0233  WBC 11.0*  --  8.0  HGB 8.8* 10.2* 8.6*  HCT 29.5* 30.0* 29.2*  MCV  88.3  --  87.7  PLT 266  --  250    Cardiac Enzymes: No results for input(s): CKTOTAL, CKMB, CKMBINDEX, TROPONINI in the last 168 hours.  BNP: BNP (last 3 results) Recent Labs    07/02/23 0712 07/15/23 1052 11/04/23 0439  BNP 1,153.7* 967.4* 1,970.1*    ProBNP (last 3 results) No results for input(s): PROBNP in the last 8760 hours.   CBG: Recent Labs  Lab 11/04/23 2138 11/05/23 0633  GLUCAP 199* 136*    Coagulation Studies: No results for input(s): LABPROT, INR in the last 72 hours.   Imaging   ECHOCARDIOGRAM COMPLETE Result Date: 11/04/2023    ECHOCARDIOGRAM REPORT   Patient Name:   Maria SELDEN Date of Exam: 11/04/2023 Medical Rec #:  [id number]                  Height:       59.0 in Accession #:    [id number]                 Weight:       155.4 lb Date of Birth:  01-09-1951                  BSA:  1.657 m Patient Age:    72 years                   BP:           132/80 mmHg Patient Gender: F                          HR:           70 bpm. Exam Location:  Inpatient Procedure: 2D Echo, Color Doppler, Cardiac Doppler and Intracardiac            Opacification Agent (Both Spectral and Color Flow Doppler were            utilized during procedure). Indications:    I50.31 Acute diastolic (congestive) heart failure  History:        Patient has prior history of Echocardiogram examinations, most                 recent 08/27/2018. CHF, CAD, COPD; Risk Factors:Hypertension and                 Dyslipidemia.  Sonographer:    Damien Senior RDCS Referring Phys: [id number] EVA KATHEE PORE IMPRESSIONS  1. Left ventricular ejection fraction, by estimation, is 30 to 35%. The left ventricle has moderately decreased function. Left ventricular endocardial border not optimally defined to evaluate regional wall motion. The left ventricular internal cavity size was mildly dilated. Left ventricular diastolic parameters are consistent with Grade II diastolic dysfunction  (pseudonormalization). Elevated left atrial pressure.  2. Right ventricular systolic function is moderately reduced. The right ventricular size is moderately enlarged.  3. Left atrial size was severely dilated.  4. Right atrial size was severely dilated.  5. The mitral valve is degenerative. Mild to moderate mitral valve regurgitation. No evidence of mitral stenosis. Moderate mitral annular calcification.  6. Tricuspid valve regurgitation is moderate.  7. The aortic valve is calcified. There is moderate calcification of the aortic valve. There is moderate thickening of the aortic valve. Aortic valve regurgitation is not visualized. Aortic valve sclerosis/calcification is present, without any evidence of aortic stenosis.  8. The inferior vena cava is normal in size with greater than 50% respiratory variability, suggesting right atrial pressure of 3 mmHg.  9. Very poor acoustical windows limit ability to detect focal wall motion abnormalities. The estimated EF may be underestimated due to poor images even with definity  contrast. FINDINGS  Left Ventricle: Left ventricular ejection fraction, by estimation, is 30 to 35%. The left ventricle has moderately decreased function. Left ventricular endocardial border not optimally defined to evaluate regional wall motion. Definity  contrast agent was given IV to delineate the left ventricular endocardial borders. The left ventricular internal cavity size was mildly dilated. There is no left ventricular hypertrophy. Abnormal (paradoxical) septal motion, consistent with left bundle branch block. Left ventricular diastolic parameters are consistent with Grade II diastolic dysfunction (pseudonormalization). Elevated left atrial pressure. Right Ventricle: The right ventricular size is moderately enlarged. No increase in right ventricular wall thickness. Right ventricular systolic function is moderately reduced. Left Atrium: Left atrial size was severely dilated. Right Atrium: Right  atrial size was severely dilated. Pericardium: There is no evidence of pericardial effusion. Mitral Valve: The mitral valve is degenerative in appearance. There is mild thickening of the mitral valve leaflet(s). There is mild calcification of the mitral valve leaflet(s). Moderate mitral annular calcification. Mild to moderate mitral valve regurgitation. No evidence of mitral valve stenosis. Tricuspid Valve:  The tricuspid valve is normal in structure. Tricuspid valve regurgitation is moderate . No evidence of tricuspid stenosis. Aortic Valve: The aortic valve is calcified. There is moderate calcification of the aortic valve. There is moderate thickening of the aortic valve. Aortic valve regurgitation is not visualized. Aortic valve sclerosis/calcification is present, without any  evidence of aortic stenosis. Pulmonic Valve: The pulmonic valve was normal in structure. Pulmonic valve regurgitation is trivial. No evidence of pulmonic stenosis. Aorta: The aortic root is normal in size and structure. Venous: The inferior vena cava is normal in size with greater than 50% respiratory variability, suggesting right atrial pressure of 3 mmHg. IAS/Shunts: No atrial level shunt detected by color flow Doppler.  LEFT VENTRICLE PLAX 2D LVIDd:         5.80 cm      Diastology LVIDs:         5.10 cm      LV e' medial:    3.48 cm/s LV PW:         1.00 cm      LV E/e' medial:  17.4 LV IVS:        0.90 cm      LV e' lateral:   5.11 cm/s LVOT diam:     2.10 cm      LV E/e' lateral: 11.8 LV SV:         40 LV SV Index:   24 LVOT Area:     3.46 cm  LV Volumes (MOD) LV vol d, MOD A2C: 196.0 ml LV vol d, MOD A4C: 202.0 ml LV vol s, MOD A2C: 121.0 ml LV vol s, MOD A4C: 147.0 ml LV SV MOD A2C:     75.0 ml LV SV MOD A4C:     202.0 ml LV SV MOD BP:      67.3 ml RIGHT VENTRICLE RV S prime:     11.00 cm/s TAPSE (M-mode): 2.3 cm LEFT ATRIUM              Index        RIGHT ATRIUM           Index LA diam:        5.50 cm  3.32 cm/m   RA Area:      40.80 cm LA Vol (A2C):   103.0 ml 62.16 ml/m  RA Volume:   184.00 ml 111.05 ml/m LA Vol (A4C):   96.0 ml  57.94 ml/m LA Biplane Vol: 99.9 ml  60.29 ml/m  AORTIC VALVE LVOT Vmax:   85.60 cm/s LVOT Vmean:  53.000 cm/s LVOT VTI:    0.115 m  AORTA Ao Root diam: 3.10 cm Ao Asc diam:  3.00 cm MITRAL VALVE               TRICUSPID VALVE MV Area (PHT): 2.76 cm    TR Peak grad:   26.4 mmHg MV Decel Time: 275 msec    TR Vmax:        257.00 cm/s MV E velocity: 60.40 cm/s MV A velocity: 73.30 cm/s  SHUNTS MV E/A ratio:  0.82        Systemic VTI:  0.12 m                            Systemic Diam: 2.10 cm Wilbert Bihari MD Electronically signed by Wilbert Bihari MD Signature Date/Time: 11/04/2023/5:34:51 PM    Final    DG Ribs Unilateral Right Result Date:  11/04/2023 CLINICAL DATA:  Fall with persistent right rib pain. EXAM: RIGHT RIBS - 2 VIEW COMPARISON:  Chest radiograph earlier today, chest CT 03/03/2022 FINDINGS: There are minimally displaced fractures of posterolateral sixth and seventh ribs, as well as anterior tenth rib. Fracture of anterior right ninth rib has surrounding callus formation and is likely subacute or chronic. Right pleural effusion is again seen, similar to radiograph earlier today. No pneumothorax. IMPRESSION: 1. Minimally displaced fractures of posterolateral right sixth and seventh ribs, as well as anterior tenth rib. 2. Fracture of anterior right ninth rib has surrounding callus formation and is likely subacute or chronic. 3. Right pleural effusion, similar to radiograph earlier today. Electronically Signed   By: Andrea Gasman M.D.   On: 11/04/2023 14:16     Medications:     Current Medications:  amiodarone   200 mg Oral Daily   busPIRone   15 mg Oral BID   ferrous sulfate   325 mg Oral Daily   hydrALAZINE   25 mg Oral TID   insulin  aspart  0-9 Units Subcutaneous TID WC   isosorbide  dinitrate  10 mg Oral TID   lidocaine   1 patch Transdermal Q24H   nicotine   21 mg Transdermal Daily    sodium chloride  flush  3 mL Intravenous Q12H   venlafaxine  XR  150 mg Oral Daily    Infusions:  heparin  1,150 Units/hr (11/05/23 0419)    Patient Profile   Maria Arroyo is a 73 y.o. female with CAD, HTN, HLD, PAF, chronic diastolic heart failure, chronic respiratory failure, DM II, Squamous cell carcinoma s/p R lobectomy 20', tobacco abuse and depression. AHF team to see for acute combined systolic and diastolic heart failure and suspected low output HF.   Assessment/Plan   Acute combined systolic and diastolic heart failure, iCM - Echo 5/20: EF 55-60%, RV mod reduced, mild-mod MR - Echo 11/04/23: EF 30-35%, GIIDD, RV mod reduced, RA/LA severely enlarged. Mild-Mod MR, mod TR.  - NYHA IV on admission - Volume overloaded on exam. Start lasix  120 mg IV BID and give 5 mg metolazone  x1 today - Suspect low output HF, plan to start milrinone  0.25 for support - Place PICC, follow CVP and co-ox - Check lactic acid - GDMT limited by AKI at this time, will add as renal function improves - Place UNNA boots  CAD - s/p stenting of the mid RCA 02/1998. Proximal RCA was stented in 12/2000.03/2009 she had a new high-grade stenosis in the mid RCA between the prior stents, this was stented.  - CT Chest 7/21 with 3v CAD - LHC 2011 with nonobs CAD. Stents widely patent.  - LHC 2013 unchanged from 2011 - Denies CP - Suspect elevated HsTrop in the setting of volume overload.  - Continue heparin  gtt - update lipid panel. Holding statin with elevated liver enzymes - Continue hydral/isordil  TID - would defer repeat cath at this time with elevated renal function  AKI on CKD stage 4 - Baseline SCr appears to be around 1.5-1.7 - Up to 2.39 with diuresis - Add inotrope support today - avoid hypotension  PAF - Stable in NSR on exam - BAE on echo this admission. At risk for reoccurrence - Continue heparin  gtt, on eliquis  at home - Continue amiodarone  200 mg daily - May need to transition  to IV amiodarone  after milrinone  started, monitor for increased ectopy vs AF.   COPD Hx of lobectomy for lung cancer Chronic respiratory failure - per primary team - stable on [place]  -  resp status should improve with diuresis  Elevated LFTS - Suspect in the setting of hepatic congestion - Holding statin - CMET tomorrow  Length of Stay: 1  Beckey LITTIE Coe, NP  11/05/2023, 8:24 AM    Advanced Heart Failure Team Pager [phone] (M-F; 7a - 5p)  Please contact [facility] for night-coverage after hours (4p -7a ) and weekends on [email or web address]   Patient seen with NP, I formulated the plan and agree with the above note.   Past history as noted above.  Patient was doing well until about 4-5 weeks ago. She began to develop exertional dyspnea as well as leg and abdomen swelling.  She quit smoking due to the dyspnea.  No chest pain.  She came to the ER 8/7 with shortness of breath, BNP 1900 with HS-TnI 664 => 758 => 956.  ECG showed NSR with IVCD 180 msec.   Echo was done showing EF 30-35%, mild LV dilation, moderate RV enlargement/dysfunction, severe biatrial enlargement, mild-moderate MR, moderate TR.  Prior echo in 5/20 showed EF 55-60%.   Patient's baseline creatinine is around 1.7-1.9.  Creatinine 2.26 at admission, increased to 2.39 with attempted diuresis. Lactate not elevated, 1.3.   General: NAD Neck: JVP 16 cm, no thyromegaly or thyroid nodule.  Lungs: Clear to auscultation bilaterally with normal respiratory effort. CV: Nondisplaced PMI.  Heart regular S1/S2, no S3/S4, 2/6 HSM LLSB/apex.  2+ edema to knees.  No carotid bruit.  Difficult to palpate pedal pulses.  Abdomen: Soft, nontender, no hepatosplenomegaly, mild distention.  Skin: Intact without lesions or rashes.  Neurologic: Alert and oriented x 3.  Psych: Normal affect. Extremities: No clubbing or cyanosis.  HEENT: Normal.   1. Acute systolic CHF: Prior echo 5/20 with EF 55-60%.  Echo this admission with EF 30-35%, mild LV  dilation, moderate RV enlargement/dysfunction, severe biatrial enlargement, mild-moderate MR, moderate TR. Cause of drop is uncertain.  Given history of CAD, suspect ischemic cardiomyopathy though no ACS-like symptoms.  HS-TnI is elevated but this is in setting of AKI and there was no trend. Of note, she has had a wide IVCD (180 msec today) which may contribute, this was new in 5/25 (admitted with AKI at that time but no evidence for MI). She is markedly volume overloaded on exam but has not diuresed well with initial Lasix  and creatinine is rising, up to 2.39, and LFTs also elevated.  Lactate not elevated but I am concerned for low output HF and worry that she will not diurese well without inotrope.  - Place PICC, follow CVP and co-ox.  - Will start milrinone  0.25 mcg/kg/min after co-ox drawn.  - Lasix  120 mg IV bid with metolazone  5 mg x 1. Follow creatinine closely.  - Continue hydralazine /isordil , BP has been stable.  - No coronary angiography planned at this time given AKI on CKD stage 3b and ACS unlikely.  2. Atrial fibrillation: Paroxysmal.  She is currently in NSR.  - Continue po amiodarone , can transition to IV if AF recurs in setting of milrinone .  - Heparin  gtt 3. CAD: PCI dRCA 1999, PCI pRCA 10/02, PCI mRCA 2011.  No chest pain.  HS-TnI elevated but no trend.  I suspect demand ischemia in setting of volume overload rather than ACS.  However, fall in EF may be related to worsening of CAD.  - Statin held with elevated LFTs, restart when trending down.  - Not on ASA given anticoagulation.  4. COPD: Patient quit smoking in 6/25.  5. AKI on CKD  stage 3b: Creatinine up to 2.39 today but patient significantly volume overloaded.  - Starting milrinone  and augmenting diuresis as above.  6. Elevated LFTs: Suspect congestive hepatopathy.  - Holding statin until LFTs trend done.   Ezra Shuck 11/05/2023 12:38 PM

## 2023-11-05 NOTE — Progress Notes (Signed)
 Heart Failure Navigator Progress Note  Assessed for Heart & Vascular TOC clinic readiness.  Patient does not meet criteria due to Advanced Heart Failure Team consulted. .   Navigator will sign off at this time.   Rhae Hammock, BSN, Scientist, clinical (histocompatibility and immunogenetics) Only

## 2023-11-05 NOTE — Progress Notes (Signed)
 Orthopedic Tech Progress Note Patient Details:  Maria Arroyo October 29, 1950 [id number]  Ortho Devices Type of Ortho Device: Nonie boot Ortho Device/Splint Location: BLE Ortho Device/Splint Interventions: Ordered, Application, Adjustment   Post Interventions Patient Tolerated: Well  [name] 11/05/2023, 10:13 AM

## 2023-11-06 DIAGNOSIS — N179 Acute kidney failure, unspecified: Secondary | ICD-10-CM | POA: Diagnosis not present

## 2023-11-06 DIAGNOSIS — E876 Hypokalemia: Secondary | ICD-10-CM

## 2023-11-06 DIAGNOSIS — I5033 Acute on chronic diastolic (congestive) heart failure: Secondary | ICD-10-CM | POA: Diagnosis not present

## 2023-11-06 DIAGNOSIS — I2510 Atherosclerotic heart disease of native coronary artery without angina pectoris: Secondary | ICD-10-CM | POA: Diagnosis not present

## 2023-11-06 DIAGNOSIS — I5021 Acute systolic (congestive) heart failure: Secondary | ICD-10-CM | POA: Diagnosis not present

## 2023-11-06 LAB — BASIC METABOLIC PANEL WITH GFR
Anion gap: 14 (ref 5–15)
BUN: 61 mg/dL — ABNORMAL HIGH (ref 8–23)
CO2: 43 mmol/L — ABNORMAL HIGH (ref 22–32)
Calcium: 8.7 mg/dL — ABNORMAL LOW (ref 8.9–10.3)
Chloride: 77 mmol/L — ABNORMAL LOW (ref 98–111)
Creatinine, Ser: 2.06 mg/dL — ABNORMAL HIGH (ref 0.44–1.00)
GFR, Estimated: 25 mL/min — ABNORMAL LOW (ref >60)
Glucose, Bld: 188 mg/dL — ABNORMAL HIGH (ref 70–99)
Potassium: 3.9 mmol/L (ref 3.5–5.1)
Sodium: 134 mmol/L — ABNORMAL LOW (ref 135–145)

## 2023-11-06 LAB — CBC
HCT: 27.2 % — ABNORMAL LOW (ref 36.0–46.0)
Hemoglobin: 8.3 g/dL — ABNORMAL LOW (ref 12.0–15.0)
MCH: 25.9 pg — ABNORMAL LOW (ref 26.0–34.0)
MCHC: 30.5 g/dL (ref 30.0–36.0)
MCV: 84.7 fL (ref 80.0–100.0)
Platelets: 253 K/uL (ref 150–400)
RBC: 3.21 MIL/uL — ABNORMAL LOW (ref 3.87–5.11)
RDW: 16.9 % — ABNORMAL HIGH (ref 11.5–15.5)
WBC: 8.8 K/uL (ref 4.0–10.5)
nRBC: 0.3 % — ABNORMAL HIGH (ref 0.0–0.2)

## 2023-11-06 LAB — HEPARIN LEVEL (UNFRACTIONATED): Heparin Unfractionated: >1.1 [IU]/mL — ABNORMAL HIGH (ref 0.30–0.70)

## 2023-11-06 LAB — COOXEMETRY PANEL
Carboxyhemoglobin: 2.2 % — ABNORMAL HIGH (ref 0.5–1.5)
Methemoglobin: <0.7 % (ref 0.0–1.5)
O2 Saturation: 73.2 %
Total hemoglobin: 8.9 g/dL — ABNORMAL LOW (ref 12.0–16.0)

## 2023-11-06 LAB — COMPREHENSIVE METABOLIC PANEL WITH GFR
ALT: 236 U/L — ABNORMAL HIGH (ref 0–44)
AST: 108 U/L — ABNORMAL HIGH (ref 15–41)
Albumin: 2.6 g/dL — ABNORMAL LOW (ref 3.5–5.0)
Alkaline Phosphatase: 172 U/L — ABNORMAL HIGH (ref 38–126)
Anion gap: 13 (ref 5–15)
BUN: 59 mg/dL — ABNORMAL HIGH (ref 8–23)
CO2: 41 mmol/L — ABNORMAL HIGH (ref 22–32)
Calcium: 8.8 mg/dL — ABNORMAL LOW (ref 8.9–10.3)
Chloride: 80 mmol/L — ABNORMAL LOW (ref 98–111)
Creatinine, Ser: 2.18 mg/dL — ABNORMAL HIGH (ref 0.44–1.00)
GFR, Estimated: 23 mL/min — ABNORMAL LOW (ref >60)
Glucose, Bld: 147 mg/dL — ABNORMAL HIGH (ref 70–99)
Potassium: 2.7 mmol/L — CL (ref 3.5–5.1)
Sodium: 134 mmol/L — ABNORMAL LOW (ref 135–145)
Total Bilirubin: 0.5 mg/dL (ref 0.0–1.2)
Total Protein: 6 g/dL — ABNORMAL LOW (ref 6.5–8.1)

## 2023-11-06 LAB — LIPID PANEL
Cholesterol: 86 mg/dL (ref 0–200)
HDL: 43 mg/dL (ref >40)
LDL Cholesterol: 31 mg/dL (ref 0–99)
Total CHOL/HDL Ratio: 2 ratio
Triglycerides: 59 mg/dL (ref <150)
VLDL: 12 mg/dL (ref 0–40)

## 2023-11-06 LAB — GLUCOSE, CAPILLARY
Glucose-Capillary: 126 mg/dL — ABNORMAL HIGH (ref 70–99)
Glucose-Capillary: 226 mg/dL — ABNORMAL HIGH (ref 70–99)
Glucose-Capillary: 176 mg/dL — ABNORMAL HIGH (ref 70–99)
Glucose-Capillary: 178 mg/dL — ABNORMAL HIGH (ref 70–99)

## 2023-11-06 LAB — APTT: aPTT: 76 s — ABNORMAL HIGH (ref 24–36)

## 2023-11-06 LAB — MAGNESIUM: Magnesium: 2.3 mg/dL (ref 1.7–2.4)

## 2023-11-06 MED ORDER — ACETAZOLAMIDE ER 500 MG PO CP12
500.0000 mg | ORAL_CAPSULE | Freq: Two times a day (BID) | ORAL | Status: DC
  Administered 2023-11-06 – 2023-11-07 (×4): 500 mg via ORAL
  Filled 2023-11-06 (×5): qty 1

## 2023-11-06 MED ORDER — POTASSIUM CHLORIDE 20 MEQ PO PACK
40.0000 meq | PACK | Freq: Two times a day (BID) | ORAL | Status: AC
  Administered 2023-11-06 – 2023-11-07 (×3): 40 meq via ORAL
  Filled 2023-11-06 (×3): qty 2

## 2023-11-06 MED ORDER — POLYETHYLENE GLYCOL 3350 17 G PO PACK
17.0000 g | PACK | Freq: Every day | ORAL | Status: DC
  Administered 2023-11-06 – 2023-11-11 (×5): 17 g via ORAL
  Filled 2023-11-06 (×6): qty 1

## 2023-11-06 MED ORDER — POTASSIUM CHLORIDE 10 MEQ/100ML IV SOLN
10.0000 meq | INTRAVENOUS | Status: AC
  Administered 2023-11-06 (×4): 10 meq via INTRAVENOUS
  Filled 2023-11-06 (×4): qty 100

## 2023-11-06 NOTE — Progress Notes (Signed)
 PHARMACY - ANTICOAGULATION CONSULT NOTE  Pharmacy Consult for heparin  Indication: NSTEMI  Allergies  Allergen Reactions   Ace Inhibitors Other (See Comments)    Worsening renal function    Codeine Nausea Only    Patient Measurements: Height: 4' 11 (149.9 cm) Weight: 70.4 kg (155 lb 3.3 oz) IBW/kg (Calculated) : 43.2 HEPARIN  DW (KG): 59  Vital Signs: Temp: 97.6 F (36.4 C) (08/09 0447) Temp Source: Oral (08/09 0447) BP: 158/58 (08/09 0447) Pulse Rate: 64 (08/09 0447)  Labs: Recent Labs    11/04/23 0439 11/04/23 0450 11/04/23 0645 11/04/23 1153 11/04/23 1412 11/04/23 1805 11/05/23 0233 11/05/23 1228 11/06/23 0543  HGB 8.8* 10.2*  --   --   --   --  8.6*  --  8.3*  HCT 29.5* 30.0*  --   --   --   --  29.2*  --  27.2*  PLT 266  --   --   --   --   --  250  --  253  APTT  --   --   --   --   --    < > 55* 72* 76*  HEPARINUNFRC  --   --   --   --   --    < > >1.10* >1.10* >1.10*  CREATININE 2.26*  --   --   --   --   --  2.39*  --  2.18*  TROPONINIHS 664*  --  758* 956* 1,139*  --   --   --   --    < > = values in this interval not displayed.    Estimated Creatinine Clearance: 19.9 mL/min (A) (by C-G formula based on SCr of 2.18 mg/dL (H)).   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   11/03/2023 Morning   albuterol  (VENTOLIN  HFA) 108 (90 Base) MCG/ACT inhaler Inhale 2 puffs into  the lungs every 6 (six) hours as needed for shortness of breath or wheezing.   11/03/2023 Noon   amiodarone  (PACERONE ) 200 MG tablet Take 1 tablet (200 mg total) by mouth daily. 90 tablet 3 11/03/2023 Morning   apixaban  (ELIQUIS ) 5 MG TABS tablet Take 1 tablet (5 mg total) by mouth 2 (two) times daily. 180 tablet 3 11/03/2023 at  8:30 PM   Ascorbic Acid (VITAMIN C PO) Take 1 tablet by mouth daily.   11/03/2023 Morning   busPIRone  (BUSPAR ) 15 MG tablet Take 15 mg by mouth 2 (two) times daily.   11/03/2023 Bedtime   Cholecalciferol  (VITAMIN D) 2000 UNITS tablet Take 2,000 Units by mouth daily.   11/03/2023 Morning   gabapentin  (NEURONTIN ) 600 MG tablet Take 600 mg by mouth in the morning, at noon, in the evening, and at bedtime.   11/03/2023 Evening  hydrALAZINE  (APRESOLINE ) 25 MG tablet Take 25 mg by mouth 3 (three) times daily.   11/03/2023 at  9:30 PM   Iron , Ferrous Sulfate , 325 (65 Fe) MG TABS Take 1 tablet by mouth daily. 30 tablet 3 11/03/2023 Morning   isosorbide  dinitrate (ISORDIL ) 10 MG tablet Take 1 tablet (10 mg total) by mouth 3 (three) times daily. 90 tablet 1 11/03/2023 at  9:30 PM   Misc. Devices MISC Inhale 3 L into the lungs continuous.   11/03/2023   Multiple Vitamin (MULTIVITAMIN WITH MINERALS) TABS tablet Take 1 tablet by mouth daily. 30 tablet 0 11/03/2023 Morning   nicotine  (NICODERM CQ  - DOSED IN MG/24 HOURS) 21 mg/24hr patch Place 1 patch (21 mg total) onto the skin daily. 28 patch 0 11/03/2023 Morning   nitroGLYCERIN  (NITROSTAT ) 0.4 MG SL tablet PLACE 1 TABLET UNDER THE TONGUE EVERY 5 MINUTES AS NEEDED CHEST PAIN. 25 tablet 1 Unknown   potassium chloride  SA (KLOR-CON  M) 20 MEQ tablet Take 20 mEq by mouth daily.   11/03/2023 Morning   rosuvastatin  (CRESTOR ) 40 MG tablet TAKE 1 TABLET BY MOUTH DAILY 90 tablet 3 11/03/2023 Morning   torsemide  (DEMADEX ) 20 MG tablet Take 1 tablet (20 mg total) by mouth daily.   11/03/2023 Morning   venlafaxine  XR (EFFEXOR -XR) 150 MG 24 hr capsule Take 150 mg by mouth daily.  0  11/03/2023 Morning    Assessment: 73y.o. with history of Afib on Eliquis  PTA (last dose 8/6 PM) who presents with CHF exacerbation.  Pharmacy consulted for heparin  dosing.  aPTT 76 sec is therapeutic on 1150 units/hr.  Heparin  level >1.10 - still impacted by PTA DOAC use. Hgb and plts stable. No reported signs or symptoms of bleeding.  Goal of Therapy:  Heparin  level 0.3-0.7 units/ml aPTT 66-102 seconds Monitor platelets by anticoagulation protocol: Yes   Plan:  Continue heparin  infusion at 1150 units/hr Daily CBC, heparin  level, aPTT until levels are correlating F/u intervention plans, ability to switch back to DOAC Monitor H&H, plts, signs and symptoms of bleeding  Rachelann Enloe, PharmD PGY-1 Pharmacy Resident [facility]  11/06/2023 7:30 AM

## 2023-11-06 NOTE — Plan of Care (Signed)
  Problem: Education: Goal: Knowledge of General Education information will improve Description: Including pain rating scale, medication(s)/side effects and non-pharmacologic comfort measures Outcome: Progressing   Problem: Clinical Measurements: Goal: Ability to maintain clinical measurements within normal limits will improve Outcome: Progressing   Problem: Clinical Measurements: Goal: Will remain free from infection Outcome: Progressing   Problem: Clinical Measurements: Goal: Diagnostic test results will improve Outcome: Progressing   Problem: Clinical Measurements: Goal: Cardiovascular complication will be avoided Outcome: Progressing   Problem: Activity: Goal: Risk for activity intolerance will decrease Outcome: Progressing

## 2023-11-06 NOTE — Progress Notes (Signed)
 Pharmacist Heart Failure Core Measure Documentation  Assessment: Arrow Emmerich has an EF documented as 30-35% on ECHO 11/04/2023.  Rationale: Heart failure patients with left ventricular systolic dysfunction (LVSD) and an EF < 40% should be prescribed an angiotensin converting enzyme inhibitor (ACEI) or angiotensin receptor blocker (ARB) at discharge unless a contraindication is documented in the medical record.  This patient is not currently on an ACEI or ARB for HF.  This note is being placed in the record in order to provide documentation that a contraindication to the use of these agents is present for this encounter.  ACE Inhibitor or Angiotensin Receptor Blocker is contraindicated (specify all that apply)  []   ACEI allergy AND ARB allergy []   Angioedema []   Moderate or severe aortic stenosis []   Hyperkalemia []   Hypotension []   Renal artery stenosis [x]   Worsening renal function, preexisting renal disease or dysfunction   Thank you for allowing pharmacy to participate in this patient's care,  Suzen Sour, PharmD, BCCCP Clinical Pharmacist  Phone: [phone] 11/06/2023 3:10 PM  Please check AMION for all [facility] Pharmacy phone numbers After 10:00 PM, call Main Pharmacy [phone]

## 2023-11-06 NOTE — Progress Notes (Signed)
 Progress Note   Patient: Maria Arroyo FMW:[id number] DOB: 09-30-1950 DOA: 11/04/2023  DOS: the patient was seen and examined on 11/06/2023   Brief hospital course:  73 y.o. 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    Assessment and Plan:   Acute hypoxic respiratory failure - Currently requiring 3L nasal cannula to maintain O2 sats in the 90s.  Showing mild improvement in dyspnea and since yesterday.  Continue to wean O2 as tolerated.   Acute exacerbation of HFrEF - Echo noting newly reduced EF 30-35%, biventricular reduced EF, severely dilated BAE.  Etiology likely worsening underlying CAD.  Cardiology/heart failure closely.  Showing markedly improved response to increase of diuretics yesterday, diuresing greater than 3 L.  Continue IV diuresis to Lasix  120 mg.  Milrinone  added per cardiology.  Monitor urine output closely.  Recheck BMP and magnesium  in AM.   NSTEMI with underlying CAD - Troponin elevation up to 1139.  Cardiology following closely.  Likely ongoing demand ischemia in the setting of worsening CHF exacerbation.  Underlying CAD with previous LAD stenting and probable stenosis of LAD.  Currently not pursuing interventional cardiac catheterization this time due to kidney dysfunction.   Acute kidney injury on CKD 4 - Showing improvement this morning.  Likely cardiorenal etiology.  Continue to monitor urine output.  Diuretics as above.  Recheck BMP and magnesium  in AM.   Metabolic alkalosis - Likely mixed in the setting of chronic hide hypoxic and hypercapnic  respiratory failure related with diuresis.  Bicarb greater than 40 this morning.  Will initiate p.o. Diamox  twice daily 500 mg.  Will recheck BMP in AM.  Hypokalemia - Exacerbated by diuresis.  Aggressive replenishment on board.  Will recheck BMP later this afternoon in AM.  Transaminitis - Likely from congestion of HFrEF.  LFTs showing improvement this morning.  Will recheck in AM.   Diabetes mellitus - Insulin  sliding scale on board.   Iron  deficiency anemia - No overt bleeding.  No active bleeding appreciated.  Showing slight downtrend.  Will transfuse if hemoglobin less than 8 secondary to cardiac history above.  Will recheck hemoglobin in AM.  Appears stable at this time.   History of squamous cell carcinoma of the right lung - S/p right lobectomy in 2020.   Tobacco abuse - Still smokes.  Encouraging smoking cessation.  Nicotine  patch offered.   Obesity class I - BMI nearly 32.  Encouraged lifestyle modification and weight loss.   Subjective: Patient resting comfortably in bed this morning, husband at bedside.  Patient states she feels improved from yesterday.  Did quite well with diuresis.  Still short of breath compared to her baseline but improved.  Denies any chest pain, purulent cough, nausea, vomiting, abdominal pain.  Physical Exam:  Vitals:   11/06/23 0025 11/06/23 0447 11/06/23 0826 11/06/23 1127  BP: (!) 159/65 (!) 158/58 (!) 150/59 (!) 144/58  Pulse: 62 64 62 69  Resp: 18 18 20 20   Temp: 97.7 F (36.5 C) 97.6 F (36.4 C) 98.2 F (36.8 C) 98.5 F (36.9 C)  TempSrc: Axillary Oral Oral Oral  SpO2: 94% 97% 93% 95%  Weight:  70.4 kg    Height:  GENERAL:  Alert, pleasant, no acute distress  HEENT:  EOMI, nasal cannula CARDIOVASCULAR:  RRR, systolic murmur appreciated RESPIRATORY: Poor air movement GASTROINTESTINAL:  Soft, nontender, nondistended EXTREMITIES: 1-2+ BL LE pitting edema NEURO:  No new focal deficits appreciated SKIN:  No rashes  noted PSYCH:  Appropriate mood and affect    Data Reviewed:  Imaging Studies: US  EKG SITE RITE Result Date: 11/05/2023 If Site Rite image not attached, placement could not be confirmed due to current cardiac rhythm.  ECHOCARDIOGRAM COMPLETE Result Date: 11/04/2023    ECHOCARDIOGRAM REPORT   Patient Name:   Maria Arroyo Date of Exam: 11/04/2023 Medical Rec #:  [id number]                  Height:       59.0 in Accession #:    [id number]                 Weight:       155.4 lb Date of Birth:  04/21/1950                  BSA:          1.657 m Patient Age:    72 years                   BP:           132/80 mmHg Patient Gender: F                          HR:           70 bpm. Exam Location:  Inpatient Procedure: 2D Echo, Color Doppler, Cardiac Doppler and Intracardiac            Opacification Agent (Both Spectral and Color Flow Doppler were            utilized during procedure). Indications:    I50.31 Acute diastolic (congestive) heart failure  History:        Patient has prior history of Echocardiogram examinations, most                 recent 08/27/2018. CHF, CAD, COPD; Risk Factors:Hypertension and                 Dyslipidemia.  Sonographer:    Damien Senior RDCS Referring Phys: [id number] EVA KATHEE PORE IMPRESSIONS  1. Left ventricular ejection fraction, by estimation, is 30 to 35%. The left ventricle has moderately decreased function. Left ventricular endocardial border not optimally defined to evaluate regional wall motion. The left ventricular internal cavity size was mildly dilated. Left ventricular diastolic parameters are consistent with Grade II diastolic dysfunction (pseudonormalization). Elevated left atrial pressure.  2. Right ventricular systolic function is moderately reduced. The right ventricular size is moderately enlarged.  3. Left atrial size was severely dilated.  4. Right atrial size was severely dilated.  5. The mitral valve is degenerative. Mild to moderate mitral valve  regurgitation. No evidence of mitral stenosis. Moderate mitral annular calcification.  6. Tricuspid valve regurgitation is moderate.  7. The aortic valve is calcified. There is moderate calcification of the aortic valve. There is moderate thickening of the aortic valve. Aortic valve regurgitation is not visualized. Aortic valve sclerosis/calcification is present, without any evidence of aortic stenosis.  8. The inferior vena cava is normal in size with greater than 50% respiratory variability, suggesting right atrial pressure of 3 mmHg.  9. Very poor acoustical windows  limit ability to detect focal wall motion abnormalities. The estimated EF may be underestimated due to poor images even with definity  contrast. FINDINGS  Left Ventricle: Left ventricular ejection fraction, by estimation, is 30 to 35%. The left ventricle has moderately decreased function. Left ventricular endocardial border not optimally defined to evaluate regional wall motion. Definity  contrast agent was given IV to delineate the left ventricular endocardial borders. The left ventricular internal cavity size was mildly dilated. There is no left ventricular hypertrophy. Abnormal (paradoxical) septal motion, consistent with left bundle branch block. Left ventricular diastolic parameters are consistent with Grade II diastolic dysfunction (pseudonormalization). Elevated left atrial pressure. Right Ventricle: The right ventricular size is moderately enlarged. No increase in right ventricular wall thickness. Right ventricular systolic function is moderately reduced. Left Atrium: Left atrial size was severely dilated. Right Atrium: Right atrial size was severely dilated. Pericardium: There is no evidence of pericardial effusion. Mitral Valve: The mitral valve is degenerative in appearance. There is mild thickening of the mitral valve leaflet(s). There is mild calcification of the mitral valve leaflet(s). Moderate mitral annular calcification. Mild to  moderate mitral valve regurgitation. No evidence of mitral valve stenosis. Tricuspid Valve: The tricuspid valve is normal in structure. Tricuspid valve regurgitation is moderate . No evidence of tricuspid stenosis. Aortic Valve: The aortic valve is calcified. There is moderate calcification of the aortic valve. There is moderate thickening of the aortic valve. Aortic valve regurgitation is not visualized. Aortic valve sclerosis/calcification is present, without any  evidence of aortic stenosis. Pulmonic Valve: The pulmonic valve was normal in structure. Pulmonic valve regurgitation is trivial. No evidence of pulmonic stenosis. Aorta: The aortic root is normal in size and structure. Venous: The inferior vena cava is normal in size with greater than 50% respiratory variability, suggesting right atrial pressure of 3 mmHg. IAS/Shunts: No atrial level shunt detected by color flow Doppler.  LEFT VENTRICLE PLAX 2D LVIDd:         5.80 cm      Diastology LVIDs:         5.10 cm      LV e' medial:    3.48 cm/s LV PW:         1.00 cm      LV E/e' medial:  17.4 LV IVS:        0.90 cm      LV e' lateral:   5.11 cm/s LVOT diam:     2.10 cm      LV E/e' lateral: 11.8 LV SV:         40 LV SV Index:   24 LVOT Area:     3.46 cm  LV Volumes (MOD) LV vol d, MOD A2C: 196.0 ml LV vol d, MOD A4C: 202.0 ml LV vol s, MOD A2C: 121.0 ml LV vol s, MOD A4C: 147.0 ml LV SV MOD A2C:     75.0 ml LV SV MOD A4C:     202.0 ml LV SV MOD BP:      67.3 ml RIGHT VENTRICLE RV S prime:     11.00 cm/s TAPSE (M-mode): 2.3 cm LEFT ATRIUM              Index        RIGHT ATRIUM           Index LA diam:        5.50 cm  3.32 cm/m   RA Area:     40.80 cm LA Vol (A2C):   103.0 ml  62.16 ml/m  RA Volume:   184.00 ml 111.05 ml/m LA Vol (A4C):   96.0 ml  57.94 ml/m LA Biplane Vol: 99.9 ml  60.29 ml/m  AORTIC VALVE LVOT Vmax:   85.60 cm/s LVOT Vmean:  53.000 cm/s LVOT VTI:    0.115 m  AORTA Ao Root diam: 3.10 cm Ao Asc diam:  3.00 cm MITRAL VALVE                TRICUSPID VALVE MV Area (PHT): 2.76 cm    TR Peak grad:   26.4 mmHg MV Decel Time: 275 msec    TR Vmax:        257.00 cm/s MV E velocity: 60.40 cm/s MV A velocity: 73.30 cm/s  SHUNTS MV E/A ratio:  0.82        Systemic VTI:  0.12 m                            Systemic Diam: 2.10 cm Wilbert Bihari MD Electronically signed by Wilbert Bihari MD Signature Date/Time: 11/04/2023/5:34:51 PM    Final    DG Ribs Unilateral Right Result Date: 11/04/2023 CLINICAL DATA:  Fall with persistent right rib pain. EXAM: RIGHT RIBS - 2 VIEW COMPARISON:  Chest radiograph earlier today, chest CT 03/03/2022 FINDINGS: There are minimally displaced fractures of posterolateral sixth and seventh ribs, as well as anterior tenth rib. Fracture of anterior right ninth rib has surrounding callus formation and is likely subacute or chronic. Right pleural effusion is again seen, similar to radiograph earlier today. No pneumothorax. IMPRESSION: 1. Minimally displaced fractures of posterolateral right sixth and seventh ribs, as well as anterior tenth rib. 2. Fracture of anterior right ninth rib has surrounding callus formation and is likely subacute or chronic. 3. Right pleural effusion, similar to radiograph earlier today. Electronically Signed   By: Andrea Gasman M.D.   On: 11/04/2023 14:16   DG Chest Port 1 View Result Date: 11/04/2023 CLINICAL DATA:  Shortness of breath. EXAM: PORTABLE CHEST 1 VIEW COMPARISON:  Portable chest 08/11/2023 FINDINGS: 4:51 a.m. The heart is enlarged. There is increased central vascular prominence, increased mild diffuse interstitial edema. There is an increased now moderate right pleural effusion a small left pleural effusion. There is overlying atelectasis or consolidation in the right lower lung field. Patchy perihilar hazy opacity is also seen and probably due to ground-glass edema. The aorta is tortuous and calcified, with stable mediastinum. Partially visible fusion plating lower cervical spine is again shown. No  new osseous abnormality. There is degenerative change of the thoracic spine. IMPRESSION: 1. Cardiomegaly with increased central vascular prominence and mild diffuse interstitial edema. 2. Increased now moderate right pleural effusion and small left pleural effusion. 3. Overlying atelectasis or consolidation in the right lower lung field. 4. Patchy perihilar hazy opacity is also seen and probably due to ground-glass edema. Electronically Signed   By: Francis Quam M.D.   On: 11/04/2023 05:07    See above  Previous records (including but not limited to H&P, progress notes, nursing notes, TOC management) were reviewed in assessment of this patient.  Labs: CBC: Recent Labs  Lab 11/04/23 0439 11/04/23 0450 11/05/23 0233 11/06/23 0543  WBC 11.0*  --  8.0 8.8  HGB 8.8* 10.2* 8.6* 8.3*  HCT 29.5* 30.0* 29.2* 27.2*  MCV 88.3  --  87.7 84.7  PLT 266  --  250 253   Basic Metabolic Panel: Recent Labs  Lab 11/04/23 0439  11/04/23 0450 11/05/23 0233 11/06/23 0543  NA 135 133* 137 134*  K 5.0 4.9 4.9 2.7*  CL 88*  --  87* 80*  CO2 33*  --  37* 41*  GLUCOSE 169*  --  143* 147*  BUN 53*  --  59* 59*  CREATININE 2.26*  --  2.39* 2.18*  CALCIUM  9.0  --  9.1 8.8*  MG 2.7*  --   --  2.3   Liver Function Tests: Recent Labs  Lab 11/04/23 1412 11/06/23 0543  AST 241* 108*  ALT 309* 236*  ALKPHOS 159* 172*  BILITOT 0.7 0.5  PROT 6.4* 6.0*  ALBUMIN 2.7* 2.6*   CBG: Recent Labs  Lab 11/05/23 1220 11/05/23 1545 11/05/23 2130 11/06/23 0639 11/06/23 1126  GLUCAP 204* 188* 221* 126* 226*    Scheduled Meds:  acetaZOLAMIDE  ER  500 mg Oral Q12H   amiodarone   200 mg Oral Daily   busPIRone   15 mg Oral BID   Chlorhexidine  Gluconate Cloth  6 each Topical Daily   ferrous sulfate   325 mg Oral Daily   hydrALAZINE   25 mg Oral TID   insulin  aspart  0-9 Units Subcutaneous TID WC   isosorbide  dinitrate  10 mg Oral TID   lidocaine   1 patch Transdermal Q24H   nicotine   21 mg Transdermal Daily    potassium chloride   40 mEq Oral BID   sodium chloride  flush  10-40 mL Intracatheter Q12H   sodium chloride  flush  3 mL Intravenous Q12H   venlafaxine  XR  150 mg Oral Daily   Continuous Infusions:  furosemide  120 mg (11/06/23 0812)   heparin  1,150 Units/hr (11/06/23 1116)   milrinone  0.25 mcg/kg/min (11/06/23 0548)   PRN Meds:.acetaminophen  **OR** acetaminophen , albuterol , HYDROcodone -acetaminophen , melatonin, mouth rinse, sodium chloride  flush  Family Communication: Husband at bedside  Disposition: Status is: Inpatient Remains inpatient appropriate because: CHF exacerbation     Time spent: 51 minutes  Length of inpatient stay: 2 days  Author: Carliss LELON Canales, DO 11/06/2023 12:15 PM  For on call review [email or web address].

## 2023-11-06 NOTE — Progress Notes (Signed)
 Progress Note  Patient Name: Maria Arroyo Date of Encounter: 11/06/2023  Primary Cardiologist:   Peter Swazil[place], MD   Subjective   She is breathing not at baseline but no acute distress  Inpatient Medications    Scheduled Meds:  acetaZOLAMIDE  ER  500 mg Oral Q12H   amiodarone   200 mg Oral Daily   busPIRone   15 mg Oral BID   Chlorhexidine  Gluconate Cloth  6 each Topical Daily   ferrous sulfate   325 mg Oral Daily   hydrALAZINE   25 mg Oral TID   insulin  aspart  0-9 Units Subcutaneous TID WC   isosorbide  dinitrate  10 mg Oral TID   lidocaine   1 patch Transdermal Q24H   nicotine   21 mg Transdermal Daily   potassium chloride   40 mEq Oral BID   sodium chloride  flush  10-40 mL Intracatheter Q12H   sodium chloride  flush  3 mL Intravenous Q12H   venlafaxine  XR  150 mg Oral Daily   Continuous Infusions:  furosemide  120 mg (11/05/23 1751)   heparin  1,150 Units/hr (11/05/23 1128)   milrinone  0.25 mcg/kg/min (11/06/23 0548)   potassium chloride      PRN Meds: acetaminophen  **OR** acetaminophen , albuterol , HYDROcodone -acetaminophen , melatonin, mouth rinse, sodium chloride  flush   Vital Signs    Vitals:   11/05/23 1630 11/05/23 1921 11/06/23 0025 11/06/23 0447  BP: (!) 147/75 (!) 141/59 (!) 159/65 (!) 158/58  Pulse: 63 61 62 64  Resp:  19 18 18   Temp: 97.7 F (36.5 C) (!) 97.5 F (36.4 C) 97.7 F (36.5 C) 97.6 F (36.4 C)  TempSrc: Oral Oral Axillary Oral  SpO2: 95% 92% 94% 97%  Weight:    70.4 kg  Height:        Intake/Output Summary (Last 24 hours) at 11/06/2023 0736 Last data filed at 11/06/2023 9388 Gross per 24 hour  Intake 1139.98 ml  Output 3750 ml  Net -2610.02 ml   Filed Weights   11/05/23 0426 11/05/23 0740 11/06/23 0447  Weight: 72.5 kg 71.2 kg 70.4 kg    Telemetry    NSR - Personally Reviewed  ECG    NA - Personally Reviewed  Physical Exam   GEN: No acute distress.   Neck: No  JVD Cardiac: RRR, 3/6 systolic murmur, no diastolic  murmurs, rubs, or gallops.  Respiratory: Clear  to auscultation bilaterally. GI: Soft, nontender, non-distended  MS:   Mild leg edema; No deformity. Neuro:  Nonfocal  Psych: Normal affect   Labs    Chemistry Recent Labs  Lab 11/04/23 0439 11/04/23 0450 11/04/23 1412 11/05/23 0233 11/06/23 0543  NA 135 133*  --  137 134*  K 5.0 4.9  --  4.9 2.7*  CL 88*  --   --  87* 80*  CO2 33*  --   --  37* 41*  GLUCOSE 169*  --   --  143* 147*  BUN 53*  --   --  59* 59*  CREATININE 2.26*  --   --  2.39* 2.18*  CALCIUM  9.0  --   --  9.1 8.8*  PROT  --   --  6.4*  --  6.0*  ALBUMIN  --   --  2.7*  --  2.6*  AST  --   --  241*  --  108*  ALT  --   --  309*  --  236*  ALKPHOS  --   --  159*  --  172*  BILITOT  --   --  0.7  --  0.5  GFRNONAA 23*  --   --  21* 23*  ANIONGAP 14  --   --  13 13     Hematology Recent Labs  Lab 11/04/23 0439 11/04/23 0450 11/05/23 0233 11/06/23 0543  WBC 11.0*  --  8.0 8.8  RBC 3.34*  --  3.33* 3.21*  HGB 8.8* 10.2* 8.6* 8.3*  HCT 29.5* 30.0* 29.2* 27.2*  MCV 88.3  --  87.7 84.7  MCH 26.3  --  25.8* 25.9*  MCHC 29.8*  --  29.5* 30.5  RDW 17.1*  --  17.1* 16.9*  PLT 266  --  250 253    Cardiac EnzymesNo results for input(s): TROPONINI in the last 168 hours. No results for input(s): TROPIPOC in the last 168 hours.   BNP Recent Labs  Lab 11/04/23 0439  BNP 1,970.1*     DDimer No results for input(s): DDIMER in the last 168 hours.   Radiology    US  EKG SITE RITE Result Date: 11/05/2023 If Site Rite image not attached, placement could not be confirmed due to current cardiac rhythm.  ECHOCARDIOGRAM COMPLETE Result Date: 11/04/2023    ECHOCARDIOGRAM REPORT   Patient Name:   Maria Arroyo Date of Exam: 11/04/2023 Medical Rec #:  [id number]                  Height:       59.0 in Accession #:    [id number]                 Weight:       155.4 lb Date of Birth:  05-Nov-1950                  BSA:          1.657 m Patient Age:    73  years                   BP:           132/80 mmHg Patient Gender: F                          HR:           70 bpm. Exam Location:  Inpatient Procedure: 2D Echo, Color Doppler, Cardiac Doppler and Intracardiac            Opacification Agent (Both Spectral and Color Flow Doppler were            utilized during procedure). Indications:    I50.31 Acute diastolic (congestive) heart failure  History:        Patient has prior history of Echocardiogram examinations, most                 recent 08/27/2018. CHF, CAD, COPD; Risk Factors:Hypertension and                 Dyslipidemia.  Sonographer:    Damien Senior RDCS Referring Phys: [id number] EVA KATHEE PORE IMPRESSIONS  1. Left ventricular ejection fraction, by estimation, is 30 to 35%. The left ventricle has moderately decreased function. Left ventricular endocardial border not optimally defined to evaluate regional wall motion. The left ventricular internal cavity size was mildly dilated. Left ventricular diastolic parameters are consistent with Grade II diastolic dysfunction (pseudonormalization). Elevated left atrial pressure.  2. Right ventricular systolic function is moderately reduced. The right ventricular size is moderately enlarged.  3. Left atrial size was  severely dilated.  4. Right atrial size was severely dilated.  5. The mitral valve is degenerative. Mild to moderate mitral valve regurgitation. No evidence of mitral stenosis. Moderate mitral annular calcification.  6. Tricuspid valve regurgitation is moderate.  7. The aortic valve is calcified. There is moderate calcification of the aortic valve. There is moderate thickening of the aortic valve. Aortic valve regurgitation is not visualized. Aortic valve sclerosis/calcification is present, without any evidence of aortic stenosis.  8. The inferior vena cava is normal in size with greater than 50% respiratory variability, suggesting right atrial pressure of 3 mmHg.  9. Very poor acoustical windows limit ability to  detect focal wall motion abnormalities. The estimated EF may be underestimated due to poor images even with definity  contrast. FINDINGS  Left Ventricle: Left ventricular ejection fraction, by estimation, is 30 to 35%. The left ventricle has moderately decreased function. Left ventricular endocardial border not optimally defined to evaluate regional wall motion. Definity  contrast agent was given IV to delineate the left ventricular endocardial borders. The left ventricular internal cavity size was mildly dilated. There is no left ventricular hypertrophy. Abnormal (paradoxical) septal motion, consistent with left bundle branch block. Left ventricular diastolic parameters are consistent with Grade II diastolic dysfunction (pseudonormalization). Elevated left atrial pressure. Right Ventricle: The right ventricular size is moderately enlarged. No increase in right ventricular wall thickness. Right ventricular systolic function is moderately reduced. Left Atrium: Left atrial size was severely dilated. Right Atrium: Right atrial size was severely dilated. Pericardium: There is no evidence of pericardial effusion. Mitral Valve: The mitral valve is degenerative in appearance. There is mild thickening of the mitral valve leaflet(s). There is mild calcification of the mitral valve leaflet(s). Moderate mitral annular calcification. Mild to moderate mitral valve regurgitation. No evidence of mitral valve stenosis. Tricuspid Valve: The tricuspid valve is normal in structure. Tricuspid valve regurgitation is moderate . No evidence of tricuspid stenosis. Aortic Valve: The aortic valve is calcified. There is moderate calcification of the aortic valve. There is moderate thickening of the aortic valve. Aortic valve regurgitation is not visualized. Aortic valve sclerosis/calcification is present, without any  evidence of aortic stenosis. Pulmonic Valve: The pulmonic valve was normal in structure. Pulmonic valve regurgitation is  trivial. No evidence of pulmonic stenosis. Aorta: The aortic root is normal in size and structure. Venous: The inferior vena cava is normal in size with greater than 50% respiratory variability, suggesting right atrial pressure of 3 mmHg. IAS/Shunts: No atrial level shunt detected by color flow Doppler.  LEFT VENTRICLE PLAX 2D LVIDd:         5.80 cm      Diastology LVIDs:         5.10 cm      LV e' medial:    3.48 cm/s LV PW:         1.00 cm      LV E/e' medial:  17.4 LV IVS:        0.90 cm      LV e' lateral:   5.11 cm/s LVOT diam:     2.10 cm      LV E/e' lateral: 11.8 LV SV:         40 LV SV Index:   24 LVOT Area:     3.46 cm  LV Volumes (MOD) LV vol d, MOD A2C: 196.0 ml LV vol d, MOD A4C: 202.0 ml LV vol s, MOD A2C: 121.0 ml LV vol s, MOD A4C: 147.0 ml LV SV MOD A2C:  75.0 ml LV SV MOD A4C:     202.0 ml LV SV MOD BP:      67.3 ml RIGHT VENTRICLE RV S prime:     11.00 cm/s TAPSE (M-mode): 2.3 cm LEFT ATRIUM              Index        RIGHT ATRIUM           Index LA diam:        5.50 cm  3.32 cm/m   RA Area:     40.80 cm LA Vol (A2C):   103.0 ml 62.16 ml/m  RA Volume:   184.00 ml 111.05 ml/m LA Vol (A4C):   96.0 ml  57.94 ml/m LA Biplane Vol: 99.9 ml  60.29 ml/m  AORTIC VALVE LVOT Vmax:   85.60 cm/s LVOT Vmean:  53.000 cm/s LVOT VTI:    0.115 m  AORTA Ao Root diam: 3.10 cm Ao Asc diam:  3.00 cm MITRAL VALVE               TRICUSPID VALVE MV Area (PHT): 2.76 cm    TR Peak grad:   26.4 mmHg MV Decel Time: 275 msec    TR Vmax:        257.00 cm/s MV E velocity: 60.40 cm/s MV A velocity: 73.30 cm/s  SHUNTS MV E/A ratio:  0.82        Systemic VTI:  0.12 m                            Systemic Diam: 2.10 cm Wilbert Bihari MD Electronically signed by Wilbert Bihari MD Signature Date/Time: 11/04/2023/5:34:51 PM    Final    DG Ribs Unilateral Right Result Date: 11/04/2023 CLINICAL DATA:  Fall with persistent right rib pain. EXAM: RIGHT RIBS - 2 VIEW COMPARISON:  Chest radiograph earlier today, chest CT 03/03/2022  FINDINGS: There are minimally displaced fractures of posterolateral sixth and seventh ribs, as well as anterior tenth rib. Fracture of anterior right ninth rib has surrounding callus formation and is likely subacute or chronic. Right pleural effusion is again seen, similar to radiograph earlier today. No pneumothorax. IMPRESSION: 1. Minimally displaced fractures of posterolateral right sixth and seventh ribs, as well as anterior tenth rib. 2. Fracture of anterior right ninth rib has surrounding callus formation and is likely subacute or chronic. 3. Right pleural effusion, similar to radiograph earlier today. Electronically Signed   By: Andrea Gasman M.D.   On: 11/04/2023 14:16    Cardiac Studies   Echo See above.    Patient Profile     73 y.o. female with CAD, HTN, HLD, PAF, chronic diastolic heart failure, chronic respiratory failure, DM II, Squamous cell carcinoma s/p R lobectomy 20', tobacco abuse and depression. AHF team to see for acute combined systolic and diastolic heart failure and suspected low output HF.   Assessment & Plan    Acute combined systolic and diastolic heart failure:   Possible ischemic etiology.  Note the EF is 30 - 35% as above.  EF had appeared to be 55% in 2020.  Holding on cath with decreased creatinine.  Continuing IV diuresis and milrinone  started yesterday.  Net 3423 cc.  CVP 9.   Co. ox 73.2 much improved.  Creatinine is trending down.    Continue current therapy.   Hypokalemia:  120 meq potassium has been ordered for supplementation.   CAD: Likely demand ischemia.  Holding  off on any cardiac cath given renal insufficiency.    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  Continue to follow-up   Paroxysmal A-fib:    Maintaining sinus rhythm.  Continue p.o. amiodarone .  Continue heparin  for now.    COPD/history of lobectomy for lung cancer:   Continue support per primary team.    DM/HLP: Continue current therapy.   Anemia:   Hemoglobin drifted down  slightly.  Continue to follow.  She has had recent extensive GI work up and no evidence of active bleeding  For questions or updates, please contact [facility] Please consult [email or web address] for contact info under Cardiology/STEMI.   Signed, Lynwood Schilling, MD  11/06/2023, 7:36 AM

## 2023-11-07 DIAGNOSIS — R7989 Other specified abnormal findings of blood chemistry: Secondary | ICD-10-CM | POA: Diagnosis not present

## 2023-11-07 DIAGNOSIS — N179 Acute kidney failure, unspecified: Secondary | ICD-10-CM | POA: Diagnosis not present

## 2023-11-07 DIAGNOSIS — I4891 Unspecified atrial fibrillation: Secondary | ICD-10-CM | POA: Diagnosis not present

## 2023-11-07 DIAGNOSIS — I5021 Acute systolic (congestive) heart failure: Secondary | ICD-10-CM | POA: Diagnosis not present

## 2023-11-07 LAB — CBC
HCT: 29.4 % — ABNORMAL LOW (ref 36.0–46.0)
Hemoglobin: 8.9 g/dL — ABNORMAL LOW (ref 12.0–15.0)
MCH: 25.9 pg — ABNORMAL LOW (ref 26.0–34.0)
MCHC: 30.3 g/dL (ref 30.0–36.0)
MCV: 85.7 fL (ref 80.0–100.0)
Platelets: 264 K/uL (ref 150–400)
RBC: 3.43 MIL/uL — ABNORMAL LOW (ref 3.87–5.11)
RDW: 17.5 % — ABNORMAL HIGH (ref 11.5–15.5)
WBC: 9 K/uL (ref 4.0–10.5)
nRBC: 0.3 % — ABNORMAL HIGH (ref 0.0–0.2)

## 2023-11-07 LAB — IRON AND TIBC
Iron: 179 ug/dL — ABNORMAL HIGH (ref 28–170)
Saturation Ratios: 36 % — ABNORMAL HIGH (ref 10.4–31.8)
TIBC: 500 ug/dL — ABNORMAL HIGH (ref 250–450)
UIBC: 321 ug/dL

## 2023-11-07 LAB — COMPREHENSIVE METABOLIC PANEL WITH GFR
ALT: 167 U/L — ABNORMAL HIGH (ref 0–44)
AST: 59 U/L — ABNORMAL HIGH (ref 15–41)
Albumin: 2.5 g/dL — ABNORMAL LOW (ref 3.5–5.0)
Alkaline Phosphatase: 151 U/L — ABNORMAL HIGH (ref 38–126)
Anion gap: 12 (ref 5–15)
BUN: 54 mg/dL — ABNORMAL HIGH (ref 8–23)
CO2: 43 mmol/L — ABNORMAL HIGH (ref 22–32)
Calcium: 8.7 mg/dL — ABNORMAL LOW (ref 8.9–10.3)
Chloride: 79 mmol/L — ABNORMAL LOW (ref 98–111)
Creatinine, Ser: 2.18 mg/dL — ABNORMAL HIGH (ref 0.44–1.00)
GFR, Estimated: 23 mL/min — ABNORMAL LOW (ref >60)
Glucose, Bld: 122 mg/dL — ABNORMAL HIGH (ref 70–99)
Potassium: 3.7 mmol/L (ref 3.5–5.1)
Sodium: 134 mmol/L — ABNORMAL LOW (ref 135–145)
Total Bilirubin: 0.8 mg/dL (ref 0.0–1.2)
Total Protein: 5.9 g/dL — ABNORMAL LOW (ref 6.5–8.1)

## 2023-11-07 LAB — COOXEMETRY PANEL
Carboxyhemoglobin: 2.3 % — ABNORMAL HIGH (ref 0.5–1.5)
Methemoglobin: <0.7 % (ref 0.0–1.5)
O2 Saturation: 73.6 %
Total hemoglobin: 9.3 g/dL — ABNORMAL LOW (ref 12.0–16.0)

## 2023-11-07 LAB — GLUCOSE, CAPILLARY
Glucose-Capillary: 132 mg/dL — ABNORMAL HIGH (ref 70–99)
Glucose-Capillary: 228 mg/dL — ABNORMAL HIGH (ref 70–99)
Glucose-Capillary: 181 mg/dL — ABNORMAL HIGH (ref 70–99)
Glucose-Capillary: 167 mg/dL — ABNORMAL HIGH (ref 70–99)

## 2023-11-07 LAB — HEPARIN LEVEL (UNFRACTIONATED)
Heparin Unfractionated: 0.74 [IU]/mL — ABNORMAL HIGH (ref 0.30–0.70)
Heparin Unfractionated: 0.85 [IU]/mL — ABNORMAL HIGH (ref 0.30–0.70)

## 2023-11-07 LAB — TROPONIN I (HIGH SENSITIVITY): Troponin I (High Sensitivity): 588 ng/L (ref <18)

## 2023-11-07 LAB — MAGNESIUM: Magnesium: 2 mg/dL (ref 1.7–2.4)

## 2023-11-07 LAB — APTT
aPTT: 59 s — ABNORMAL HIGH (ref 24–36)
aPTT: 66 s — ABNORMAL HIGH (ref 24–36)

## 2023-11-07 MED ORDER — BISACODYL 10 MG RE SUPP
10.0000 mg | Freq: Every day | RECTAL | Status: DC | PRN
  Administered 2023-11-07: 10 mg via RECTAL
  Filled 2023-11-07: qty 1

## 2023-11-07 MED ORDER — BISACODYL 5 MG PO TBEC
5.0000 mg | DELAYED_RELEASE_TABLET | Freq: Every day | ORAL | Status: DC | PRN
  Administered 2023-11-07: 5 mg via ORAL
  Filled 2023-11-07: qty 1

## 2023-11-07 MED ORDER — FLEET ENEMA RE ENEM
1.0000 | ENEMA | Freq: Once | RECTAL | Status: AC | PRN
  Administered 2023-11-07: 1 via RECTAL
  Filled 2023-11-07: qty 1

## 2023-11-07 NOTE — Progress Notes (Signed)
 PHARMACY - ANTICOAGULATION CONSULT NOTE  Pharmacy Consult for heparin  Indication: NSTEMI  Allergies  Allergen Reactions   Ace Inhibitors Other (See Comments)    Worsening renal function    Codeine Nausea Only    Patient Measurements: Height: 4' 11 (149.9 cm) Weight: 67.7 kg (149 lb 4 oz) IBW/kg (Calculated) : 43.2 HEPARIN  DW (KG): 59  Vital Signs: Temp: 98.1 F (36.7 C) (08/10 1549) Temp Source: Oral (08/10 1549) BP: 149/98 (08/10 1549) Pulse Rate: 77 (08/10 1549)  Labs: Recent Labs    11/05/23 0233 11/05/23 1228 11/06/23 0543 11/06/23 1534 11/07/23 0535 11/07/23 0536 11/07/23 1700  HGB 8.6*  --  8.3*  --  8.9*  --   --   HCT 29.2*  --  27.2*  --  29.4*  --   --   PLT 250  --  253  --  264  --   --   APTT 55*   < > 76*  --  59*  --  66*  HEPARINUNFRC >1.10*   < > >1.10*  --   --  0.74* 0.85*  CREATININE 2.39*  --  2.18* 2.06* 2.18*  --   --   TROPONINIHS  --   --   --   --  588*  --   --    < > = values in this interval not displayed.    Estimated Creatinine Clearance: 19.5 mL/min (A) (by C-G formula based on SCr of 2.18 mg/dL (H)).   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   11/03/2023 Morning   albuterol  (VENTOLIN  HFA) 108 (90 Base) MCG/ACT inhaler Inhale 2 puffs into the lungs every 6 (six) hours as needed for shortness of breath or wheezing.   11/03/2023 Noon    amiodarone  (PACERONE ) 200 MG tablet Take 1 tablet (200 mg total) by mouth daily. 90 tablet 3 11/03/2023 Morning   apixaban  (ELIQUIS ) 5 MG TABS tablet Take 1 tablet (5 mg total) by mouth 2 (two) times daily. 180 tablet 3 11/03/2023 at  8:30 PM   Ascorbic Acid (VITAMIN C PO) Take 1 tablet by mouth daily.   11/03/2023 Morning   busPIRone  (BUSPAR ) 15 MG tablet Take 15 mg by mouth 2 (two) times daily.   11/03/2023 Bedtime   Cholecalciferol  (VITAMIN D) 2000 UNITS tablet Take 2,000 Units by mouth daily.   11/03/2023 Morning   gabapentin  (NEURONTIN ) 600 MG tablet Take 600 mg by mouth in the morning, at noon, in the evening, and at bedtime.   11/03/2023 Evening   hydrALAZINE  (APRESOLINE ) 25 MG tablet Take 25 mg by mouth 3 (three) times daily.   11/03/2023 at  9:30 PM   Iron , Ferrous Sulfate , 325 (65 Fe) MG TABS Take 1 tablet by mouth daily. 30 tablet 3  11/03/2023 Morning   isosorbide  dinitrate (ISORDIL ) 10 MG tablet Take 1 tablet (10 mg total) by mouth 3 (three) times daily. 90 tablet 1 11/03/2023 at  9:30 PM   Misc. Devices MISC Inhale 3 L into the lungs continuous.   11/03/2023   Multiple Vitamin (MULTIVITAMIN WITH MINERALS) TABS tablet Take 1 tablet by mouth daily. 30 tablet 0 11/03/2023 Morning   nicotine  (NICODERM CQ  - DOSED IN MG/24 HOURS) 21 mg/24hr patch Place 1 patch (21 mg total) onto the skin daily. 28 patch 0 11/03/2023 Morning   nitroGLYCERIN  (NITROSTAT ) 0.4 MG SL tablet PLACE 1 TABLET UNDER THE TONGUE EVERY 5 MINUTES AS NEEDED CHEST PAIN. 25 tablet 1 Unknown   potassium chloride  SA (KLOR-CON  M) 20 MEQ tablet Take 20 mEq by mouth daily.   11/03/2023 Morning   rosuvastatin  (CRESTOR ) 40 MG tablet TAKE 1 TABLET BY MOUTH DAILY 90 tablet 3 11/03/2023 Morning   torsemide  (DEMADEX ) 20 MG tablet Take 1 tablet (20 mg total) by mouth daily.   11/03/2023 Morning   venlafaxine  XR (EFFEXOR -XR) 150 MG 24 hr capsule Take 150 mg by mouth daily.  0 11/03/2023 Morning    Assessment: 73y.o. with history of Afib on Eliquis  PTA (last dose 8/6  PM) who presents with CHF exacerbation.  Pharmacy consulted for heparin  dosing.  aPTT 66 sec is therapeutic on 1250 units/hr.   Hgb and plts stable. No reported signs or symptoms of bleeding per MD and nurse.  Goal of Therapy:  Heparin  level 0.3-0.7 units/ml aPTT 66-102 seconds Monitor platelets by anticoagulation protocol: Yes   Plan:  Continue heparin  infusion at 1250 units/hr Daily CBC, heparin  level, aPTT until levels are correlating F/u intervention plans, ability to switch back to DOAC Monitor H&H, plts, signs and symptoms of bleeding   Olam Chalk Pharm.D. CPP, BCPS Clinical Pharmacist [phone] 11/07/2023 8:12 PM

## 2023-11-07 NOTE — Progress Notes (Addendum)
 PHARMACY - ANTICOAGULATION CONSULT NOTE  Pharmacy Consult for heparin  Indication: NSTEMI  Allergies  Allergen Reactions   Ace Inhibitors Other (See Comments)    Worsening renal function    Codeine Nausea Only    Patient Measurements: Height: 4' 11 (149.9 cm) Weight: 67.7 kg (149 lb 4 oz) IBW/kg (Calculated) : 43.2 HEPARIN  DW (KG): 59  Vital Signs: Temp: 98.3 F (36.8 C) (08/10 0446) Temp Source: Oral (08/10 0446) BP: 114/64 (08/10 0446) Pulse Rate: 68 (08/10 0446)  Labs: Recent Labs    11/04/23 1153 11/04/23 1412 11/04/23 1805 11/05/23 0233 11/05/23 1228 11/06/23 0543 11/06/23 1534 11/07/23 0535 11/07/23 0536  HGB  --   --    < > 8.6*  --  8.3*  --  8.9*  --   HCT  --   --   --  29.2*  --  27.2*  --  29.4*  --   PLT  --   --   --  250  --  253  --  264  --   APTT  --   --    < > 55* 72* 76*  --  59*  --   HEPARINUNFRC  --   --    < > >1.10* >1.10* >1.10*  --   --  0.74*  CREATININE  --   --    < > 2.39*  --  2.18* 2.06* 2.18*  --   TROPONINIHS 956* 1,139*  --   --   --   --   --  588*  --    < > = values in this interval not displayed.    Estimated Creatinine Clearance: 19.5 mL/min (A) (by C-G formula based on SCr of 2.18 mg/dL (H)).   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   11/03/2023 Morning   albuterol  (VENTOLIN  HFA) 108 (90 Base) MCG/ACT inhaler Inhale 2 puffs into  the lungs every 6 (six) hours as needed for shortness of breath or wheezing.   11/03/2023 Noon   amiodarone  (PACERONE ) 200 MG tablet Take 1 tablet (200 mg total) by mouth daily. 90 tablet 3 11/03/2023 Morning   apixaban  (ELIQUIS ) 5 MG TABS tablet Take 1 tablet (5 mg total) by mouth 2 (two) times daily. 180 tablet 3 11/03/2023 at  8:30 PM   Ascorbic Acid (VITAMIN C PO) Take 1 tablet by mouth daily.   11/03/2023 Morning   busPIRone  (BUSPAR ) 15 MG tablet Take 15 mg by mouth 2 (two) times daily.   11/03/2023 Bedtime   Cholecalciferol  (VITAMIN D) 2000 UNITS tablet Take 2,000 Units by mouth daily.   11/03/2023 Morning   gabapentin  (NEURONTIN ) 600 MG tablet Take 600 mg by mouth in the morning, at noon, in the evening, and at bedtime.  11/03/2023 Evening   hydrALAZINE  (APRESOLINE ) 25 MG tablet Take 25 mg by mouth 3 (three) times daily.   11/03/2023 at  9:30 PM   Iron , Ferrous Sulfate , 325 (65 Fe) MG TABS Take 1 tablet by mouth daily. 30 tablet 3 11/03/2023 Morning   isosorbide  dinitrate (ISORDIL ) 10 MG tablet Take 1 tablet (10 mg total) by mouth 3 (three) times daily. 90 tablet 1 11/03/2023 at  9:30 PM   Misc. Devices MISC Inhale 3 L into the lungs continuous.   11/03/2023   Multiple Vitamin (MULTIVITAMIN WITH MINERALS) TABS tablet Take 1 tablet by mouth daily. 30 tablet 0 11/03/2023 Morning   nicotine  (NICODERM CQ  - DOSED IN MG/24 HOURS) 21 mg/24hr patch Place 1 patch (21 mg total) onto the skin daily. 28 patch 0 11/03/2023 Morning   nitroGLYCERIN  (NITROSTAT ) 0.4 MG SL tablet PLACE 1 TABLET UNDER THE TONGUE EVERY 5 MINUTES AS NEEDED CHEST PAIN. 25 tablet 1 Unknown   potassium chloride  SA (KLOR-CON  M) 20 MEQ tablet Take 20 mEq by mouth daily.   11/03/2023 Morning   rosuvastatin  (CRESTOR ) 40 MG tablet TAKE 1 TABLET BY MOUTH DAILY 90 tablet 3 11/03/2023 Morning   torsemide  (DEMADEX ) 20 MG tablet Take 1 tablet (20 mg total) by mouth daily.   11/03/2023 Morning   venlafaxine  XR (EFFEXOR -XR) 150 MG 24 hr capsule Take 150 mg by mouth daily.  0  11/03/2023 Morning    Assessment: 73y.o. with history of Afib on Eliquis  PTA (last dose 8/6 PM) who presents with CHF exacerbation.  Pharmacy consulted for heparin  dosing.  aPTT 59 sec is subtherapeutic on 1150 units/hr.  Heparin  level 0.75- still impacted by PTA DOAC use. Hgb and plts stable. No reported signs or symptoms of bleeding per MD and nurse.  Goal of Therapy:  Heparin  level 0.3-0.7 units/ml aPTT 66-102 seconds Monitor platelets by anticoagulation protocol: Yes   Plan:  Increase heparin  infusion at 1250 units/hr 8 hour aPTT and Heparin  Level Daily CBC, heparin  level, aPTT until levels are correlating F/u intervention plans, ability to switch back to DOAC Monitor H&H, plts, signs and symptoms of bleeding  R. Samual Satterfield, PharmD PGY-1 Acute Care Pharmacy Resident [facility] 11/07/2023 8:31 AM

## 2023-11-07 NOTE — Plan of Care (Signed)
  Problem: Education: Goal: Knowledge of General Education information will improve Description: Including pain rating scale, medication(s)/side effects and non-pharmacologic comfort measures Outcome: Progressing   Problem: Clinical Measurements: Goal: Ability to maintain clinical measurements within normal limits will improve Outcome: Progressing   Problem: Clinical Measurements: Goal: Will remain free from infection Outcome: Progressing   Problem: Activity: Goal: Risk for activity intolerance will decrease Outcome: Progressing   Problem: Nutrition: Goal: Adequate nutrition will be maintained Outcome: Progressing   Problem: Safety: Goal: Ability to remain free from injury will improve Outcome: Progressing   Problem: Pain Managment: Goal: General experience of comfort will improve and/or be controlled Outcome: Progressing

## 2023-11-07 NOTE — Progress Notes (Signed)
 Progress Note   Patient: Maria Arroyo FMW:[id number] DOB: 11/08/1950 DOA: 11/04/2023  DOS: the patient was seen and examined on 11/07/2023   Brief hospital course:  73 y.o. 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    Assessment and Plan:   Acute hypoxic respiratory failure - Currently requiring 3L nasal cannula to maintain O2 sats in the 90s.  Showing mild improvement in dyspnea and since yesterday.  Continue to wean O2 as tolerated.   Acute exacerbation of HFrEF - Echo noting newly reduced EF 30-35%, biventricular reduced EF, severely dilated BAE.  Etiology likely worsening underlying CAD.  Cardiology/heart failure closely.  Showing markedly improved response to increase of diuretics yesterday, diuresing greater than 8 L total since presentation.  Continue IV diuresis to Lasix  120 mg.  Milrinone  added per cardiology.  Monitor urine output closely.  Recheck BMP and magnesium  in AM.   NSTEMI with underlying CAD - Troponin elevation up to 1139 downtrending this morning..  Cardiology following closely.  Likely ongoing demand ischemia in the setting of worsening CHF exacerbation.  Underlying CAD with previous LAD stenting and probable stenosis of LAD.  Currently not pursuing interventional cardiac catheterization this time due to kidney dysfunction.   Acute kidney injury on CKD 4 - Creatinine similar to yesterday, likely at patient's baseline.  Continue to monitor urine output.  Diuretics as above.  Recheck BMP and magnesium  in AM.   Metabolic alkalosis - Likely mixed in the  setting of chronic hide hypoxic and hypercapnic respiratory failure related with diuresis.  Bicarb continues to be greater than 40 this morning.  Continue p.o. Diamox  twice daily 500 mg.  Will recheck BMP in AM.   Hypokalemia - Exacerbated by diuresis.  Aggressive replenishment on board.  Will recheck BMP later this afternoon in AM.   Transaminitis - Likely from congestion of HFrEF.  LFTs showing improvement this morning.  Will recheck in AM.   Diabetes mellitus - Insulin  sliding scale on board.   Iron  deficiency anemia - No overt bleeding.  No active bleeding appreciated.  Showing initial downtrend, improved this morning however.  Will transfuse if hemoglobin less than 8 secondary to cardiac history above.  Will recheck hemoglobin in AM.  Appears stable at this time.   History of squamous cell carcinoma of the right lung - S/p right lobectomy in 2020.   Tobacco abuse - Still smokes.  Encouraging smoking cessation.  Nicotine  patch offered.   Obesity class I - BMI nearly 32.  Encouraged lifestyle modification and weight loss.   Subjective: Patient resting comfortably this morning.  States she is feeling improved.  Still has some shortness of breath but improving.  Denies any chest pain, fever, chills, nausea, vomiting, abdominal pain.  Doing excellent with diuresis.  Family at bedside.  Physical Exam:  Vitals:   11/07/23 0006 11/07/23 0446 11/07/23 0735 11/07/23 1113  BP: (!) 141/58 114/64 132/73 (!) 149/61  Pulse: 69 68 74 71  Resp: 18 18 18 18   Temp: 98.5 F (36.9 C) 98.3 F (36.8 C) 97.8 F (36.6 C) 97.6 F (36.4 C)  TempSrc: Oral Oral Oral Oral  SpO2: 92% 98% 93% 97%  Weight:  67.7 kg  Height:        GENERAL:  Alert, pleasant, no acute distress  HEENT:  EOMI, nasal cannula CARDIOVASCULAR:  RRR, systolic murmur appreciated RESPIRATORY: Poor air movement GASTROINTESTINAL:  Soft, nontender, nondistended EXTREMITIES: 1+ BL LE pitting edema NEURO:  No new focal  deficits appreciated SKIN:  No rashes noted PSYCH:  Appropriate mood and affect    Data Reviewed:  Imaging Studies: US  EKG SITE RITE Result Date: 11/05/2023 If Site Rite image not attached, placement could not be confirmed due to current cardiac rhythm.  ECHOCARDIOGRAM COMPLETE Result Date: 11/04/2023    ECHOCARDIOGRAM REPORT   Patient Name:   Maria Arroyo Date of Exam: 11/04/2023 Medical Rec #:  [id number]                  Height:       59.0 in Accession #:    [id number]                 Weight:       155.4 lb Date of Birth:  [birth year]                  BSA:          1.657 m Patient Age:    72 years                   BP:           132/80 mmHg Patient Gender: F                          HR:           70 bpm. Exam Location:  Inpatient Procedure: 2D Echo, Color Doppler, Cardiac Doppler and Intracardiac            Opacification Agent (Both Spectral and Color Flow Doppler were            utilized during procedure). Indications:    I50.31 Acute diastolic (congestive) heart failure  History:        Patient has prior history of Echocardiogram examinations, most                 recent 08/27/2018. CHF, CAD, COPD; Risk Factors:Hypertension and                 Dyslipidemia.  Sonographer:    Damien Senior RDCS Referring Phys: [id number] EVA KATHEE PORE IMPRESSIONS  1. Left ventricular ejection fraction, by estimation, is 30 to 35%. The left ventricle has moderately decreased function. Left ventricular endocardial border not optimally defined to evaluate regional wall motion. The left ventricular internal cavity size was mildly dilated. Left ventricular diastolic parameters are consistent with Grade II diastolic dysfunction (pseudonormalization). Elevated left atrial pressure.  2. Right ventricular systolic function is moderately reduced. The right ventricular size is moderately enlarged.  3. Left atrial size was severely dilated.  4. Right atrial size was severely dilated.  5. The mitral valve is degenerative.  Mild to moderate mitral valve regurgitation. No evidence of mitral stenosis. Moderate mitral annular calcification.  6. Tricuspid valve regurgitation is moderate.  7. The aortic valve is calcified. There is moderate calcification of the aortic valve. There is moderate thickening of the aortic valve. Aortic valve regurgitation is not visualized. Aortic valve sclerosis/calcification is present, without any evidence of aortic stenosis.  8. The inferior vena cava is normal in size with greater than 50% respiratory variability, suggesting right atrial pressure of  3 mmHg.  9. Very poor acoustical windows limit ability to detect focal wall motion abnormalities. The estimated EF may be underestimated due to poor images even with definity  contrast. FINDINGS  Left Ventricle: Left ventricular ejection fraction, by estimation, is 30 to 35%. The left ventricle has moderately decreased function. Left ventricular endocardial border not optimally defined to evaluate regional wall motion. Definity  contrast agent was given IV to delineate the left ventricular endocardial borders. The left ventricular internal cavity size was mildly dilated. There is no left ventricular hypertrophy. Abnormal (paradoxical) septal motion, consistent with left bundle branch block. Left ventricular diastolic parameters are consistent with Grade II diastolic dysfunction (pseudonormalization). Elevated left atrial pressure. Right Ventricle: The right ventricular size is moderately enlarged. No increase in right ventricular wall thickness. Right ventricular systolic function is moderately reduced. Left Atrium: Left atrial size was severely dilated. Right Atrium: Right atrial size was severely dilated. Pericardium: There is no evidence of pericardial effusion. Mitral Valve: The mitral valve is degenerative in appearance. There is mild thickening of the mitral valve leaflet(s). There is mild calcification of the mitral valve leaflet(s). Moderate mitral annular  calcification. Mild to moderate mitral valve regurgitation. No evidence of mitral valve stenosis. Tricuspid Valve: The tricuspid valve is normal in structure. Tricuspid valve regurgitation is moderate . No evidence of tricuspid stenosis. Aortic Valve: The aortic valve is calcified. There is moderate calcification of the aortic valve. There is moderate thickening of the aortic valve. Aortic valve regurgitation is not visualized. Aortic valve sclerosis/calcification is present, without any  evidence of aortic stenosis. Pulmonic Valve: The pulmonic valve was normal in structure. Pulmonic valve regurgitation is trivial. No evidence of pulmonic stenosis. Aorta: The aortic root is normal in size and structure. Venous: The inferior vena cava is normal in size with greater than 50% respiratory variability, suggesting right atrial pressure of 3 mmHg. IAS/Shunts: No atrial level shunt detected by color flow Doppler.  LEFT VENTRICLE PLAX 2D LVIDd:         5.80 cm      Diastology LVIDs:         5.10 cm      LV e' medial:    3.48 cm/s LV PW:         1.00 cm      LV E/e' medial:  17.4 LV IVS:        0.90 cm      LV e' lateral:   5.11 cm/s LVOT diam:     2.10 cm      LV E/e' lateral: 11.8 LV SV:         40 LV SV Index:   24 LVOT Area:     3.46 cm  LV Volumes (MOD) LV vol d, MOD A2C: 196.0 ml LV vol d, MOD A4C: 202.0 ml LV vol s, MOD A2C: 121.0 ml LV vol s, MOD A4C: 147.0 ml LV SV MOD A2C:     75.0 ml LV SV MOD A4C:     202.0 ml LV SV MOD BP:      67.3 ml RIGHT VENTRICLE RV S prime:     11.00 cm/s TAPSE (M-mode): 2.3 cm LEFT ATRIUM              Index        RIGHT ATRIUM           Index LA diam:        5.50 cm  3.32 cm/m   RA Area:     40.80  cm LA Vol (A2C):   103.0 ml 62.16 ml/m  RA Volume:   184.00 ml 111.05 ml/m LA Vol (A4C):   96.0 ml  57.94 ml/m LA Biplane Vol: 99.9 ml  60.29 ml/m  AORTIC VALVE LVOT Vmax:   85.60 cm/s LVOT Vmean:  53.000 cm/s LVOT VTI:    0.115 m  AORTA Ao Root diam: 3.10 cm Ao Asc diam:  3.00 cm MITRAL  VALVE               TRICUSPID VALVE MV Area (PHT): 2.76 cm    TR Peak grad:   26.4 mmHg MV Decel Time: 275 msec    TR Vmax:        257.00 cm/s MV E velocity: 60.40 cm/s MV A velocity: 73.30 cm/s  SHUNTS MV E/A ratio:  0.82        Systemic VTI:  0.12 m                            Systemic Diam: 2.10 cm Wilbert Bihari MD Electronically signed by Wilbert Bihari MD Signature Date/Time: 11/04/2023/5:34:51 PM    Final    DG Ribs Unilateral Right Result Date: 11/04/2023 CLINICAL DATA:  Fall with persistent right rib pain. EXAM: RIGHT RIBS - 2 VIEW COMPARISON:  Chest radiograph earlier today, chest CT 03/03/2022 FINDINGS: There are minimally displaced fractures of posterolateral sixth and seventh ribs, as well as anterior tenth rib. Fracture of anterior right ninth rib has surrounding callus formation and is likely subacute or chronic. Right pleural effusion is again seen, similar to radiograph earlier today. No pneumothorax. IMPRESSION: 1. Minimally displaced fractures of posterolateral right sixth and seventh ribs, as well as anterior tenth rib. 2. Fracture of anterior right ninth rib has surrounding callus formation and is likely subacute or chronic. 3. Right pleural effusion, similar to radiograph earlier today. Electronically Signed   By: Andrea Gasman M.D.   On: 11/04/2023 14:16   DG Chest Port 1 View Result Date: 11/04/2023 CLINICAL DATA:  Shortness of breath. EXAM: PORTABLE CHEST 1 VIEW COMPARISON:  Portable chest 08/11/2023 FINDINGS: 4:51 a.m. The heart is enlarged. There is increased central vascular prominence, increased mild diffuse interstitial edema. There is an increased now moderate right pleural effusion a small left pleural effusion. There is overlying atelectasis or consolidation in the right lower lung field. Patchy perihilar hazy opacity is also seen and probably due to ground-glass edema. The aorta is tortuous and calcified, with stable mediastinum. Partially visible fusion plating lower cervical  spine is again shown. No new osseous abnormality. There is degenerative change of the thoracic spine. IMPRESSION: 1. Cardiomegaly with increased central vascular prominence and mild diffuse interstitial edema. 2. Increased now moderate right pleural effusion and small left pleural effusion. 3. Overlying atelectasis or consolidation in the right lower lung field. 4. Patchy perihilar hazy opacity is also seen and probably due to ground-glass edema. Electronically Signed   By: Francis Quam M.D.   On: 11/04/2023 05:07    There are no new results to review at this time.  Previous records (including but not limited to H&P, progress notes, nursing notes, TOC management) were reviewed in assessment of this patient.  Labs: CBC: Recent Labs  Lab 11/04/23 0439 11/04/23 0450 11/05/23 0233 11/06/23 0543 11/07/23 0535  WBC 11.0*  --  8.0 8.8 9.0  HGB 8.8* 10.2* 8.6* 8.3* 8.9*  HCT 29.5* 30.0* 29.2* 27.2* 29.4*  MCV 88.3  --  87.7 84.7 85.7  PLT 266  --  250 253 264   Basic Metabolic Panel: Recent Labs  Lab 11/04/23 0439 11/04/23 0450 11/05/23 0233 11/06/23 0543 11/06/23 1534 11/07/23 0535  NA 135 133* 137 134* 134* 134*  K 5.0 4.9 4.9 2.7* 3.9 3.7  CL 88*  --  87* 80* 77* 79*  CO2 33*  --  37* 41* 43* 43*  GLUCOSE 169*  --  143* 147* 188* 122*  BUN 53*  --  59* 59* 61* 54*  CREATININE 2.26*  --  2.39* 2.18* 2.06* 2.18*  CALCIUM  9.0  --  9.1 8.8* 8.7* 8.7*  MG 2.7*  --   --  2.3  --  2.0   Liver Function Tests: Recent Labs  Lab 11/04/23 1412 11/06/23 0543 11/07/23 0535  AST 241* 108* 59*  ALT 309* 236* 167*  ALKPHOS 159* 172* 151*  BILITOT 0.7 0.5 0.8  PROT 6.4* 6.0* 5.9*  ALBUMIN 2.7* 2.6* 2.5*   CBG: Recent Labs  Lab 11/06/23 1126 11/06/23 1548 11/06/23 2023 11/07/23 0620 11/07/23 1110  GLUCAP 226* 176* 178* 132* 228*    Scheduled Meds:  acetaZOLAMIDE  ER  500 mg Oral Q12H   amiodarone   200 mg Oral Daily   busPIRone   15 mg Oral BID   Chlorhexidine  Gluconate  Cloth  6 each Topical Daily   ferrous sulfate   325 mg Oral Daily   hydrALAZINE   25 mg Oral TID   insulin  aspart  0-9 Units Subcutaneous TID WC   isosorbide  dinitrate  10 mg Oral TID   lidocaine   1 patch Transdermal Q24H   nicotine   21 mg Transdermal Daily   polyethylene glycol  17 g Oral Daily   sodium chloride  flush  10-40 mL Intracatheter Q12H   sodium chloride  flush  3 mL Intravenous Q12H   venlafaxine  XR  150 mg Oral Daily   Continuous Infusions:  furosemide  120 mg (11/07/23 0913)   heparin  1,250 Units/hr (11/07/23 1007)   milrinone  0.25 mcg/kg/min (11/07/23 0916)   PRN Meds:.acetaminophen  **OR** acetaminophen , albuterol , bisacodyl , bisacodyl , HYDROcodone -acetaminophen , melatonin, mouth rinse, sodium chloride  flush, sodium phosphate   Family Communication: Husband at bedside  Disposition: Status is: Inpatient Remains inpatient appropriate because: CHF exacerbation     Time spent: 36 minutes  Length of inpatient stay: 3 days  Author: Carliss LELON Canales, DO 11/07/2023 1:10 PM  For on call review [email or web address].

## 2023-11-07 NOTE — Progress Notes (Signed)
 Progress Note  Patient Name: Maria Arroyo Date of Encounter: 11/07/2023  Primary Cardiologist:   Peter Sw[place], MD   Subjective   She is breathing OK.  No acute SOB.  Complains of back pain.  Inpatient Medications    Scheduled Meds:  acetaZOLAMIDE  ER  500 mg Oral Q12H   amiodarone   200 mg Oral Daily   busPIRone   15 mg Oral BID   Chlorhexidine  Gluconate Cloth  6 each Topical Daily   ferrous sulfate   325 mg Oral Daily   hydrALAZINE   25 mg Oral TID   insulin  aspart  0-9 Units Subcutaneous TID WC   isosorbide  dinitrate  10 mg Oral TID   lidocaine   1 patch Transdermal Q24H   nicotine   21 mg Transdermal Daily   polyethylene glycol  17 g Oral Daily   potassium chloride   40 mEq Oral BID   sodium chloride  flush  10-40 mL Intracatheter Q12H   sodium chloride  flush  3 mL Intravenous Q12H   venlafaxine  XR  150 mg Oral Daily   Continuous Infusions:  furosemide  120 mg (11/06/23 1824)   heparin  1,150 Units/hr (11/06/23 1116)   milrinone  0.25 mcg/kg/min (11/06/23 1356)   PRN Meds: acetaminophen  **OR** acetaminophen , albuterol , HYDROcodone -acetaminophen , melatonin, mouth rinse, sodium chloride  flush   Vital Signs    Vitals:   11/06/23 2132 11/07/23 0006 11/07/23 0446 11/07/23 0735  BP: 129/67 (!) 141/58 114/64 132/73  Pulse:  69 68 74  Resp:  18 18 18   Temp:  98.5 F (36.9 C) 98.3 F (36.8 C) 97.8 F (36.6 C)  TempSrc:  Oral Oral Oral  SpO2:  92% 98% 93%  Weight:   67.7 kg   Height:        Intake/Output Summary (Last 24 hours) at 11/07/2023 0848 Last data filed at 11/07/2023 0847 Gross per 24 hour  Intake 1655.91 ml  Output 7400 ml  Net -5744.09 ml   Filed Weights   11/05/23 0740 11/06/23 0447 11/07/23 0446  Weight: 71.2 kg 70.4 kg 67.7 kg    Telemetry    NSR - Personally Reviewed  ECG    NA - Personally Reviewed  Physical Exam   GEN: No  acute distress.   Neck: No  JVD Cardiac: RRR, no murmurs, rubs, or gallops.  Respiratory:      Decreased breath sounds GI: Soft, nontender, non-distended, normal bowel sounds  MS:  No edema; No deformity. Neuro:   Nonfocal  Psych: Oriented and appropriate    Labs    Chemistry Recent Labs  Lab 11/04/23 1412 11/05/23 0233 11/06/23 0543 11/06/23 1534 11/07/23 0535  NA  --    < > 134* 134* 134*  K  --    < > 2.7* 3.9 3.7  CL  --    < > 80* 77* 79*  CO2  --    < > 41* 43* 43*  GLUCOSE  --    < > 147* 188* 122*  BUN  --    < > 59* 61* 54*  CREATININE  --    < > 2.18* 2.06* 2.18*  CALCIUM   --    < > 8.8* 8.7* 8.7*  PROT 6.4*  --  6.0*  --  5.9*  ALBUMIN 2.7*  --  2.6*  --  2.5*  AST 241*  --  108*  --  59*  ALT 309*  --  236*  --  167*  ALKPHOS 159*  --  172*  --  151*  BILITOT 0.7  --  0.5  --  0.8  GFRNONAA  --    < > 23* 25* 23*  ANIONGAP  --    < > 13 14 12    < > = values in this interval not displayed.     Hematology Recent Labs  Lab 11/05/23 0233 11/06/23 0543 11/07/23 0535  WBC 8.0 8.8 9.0  RBC 3.33* 3.21* 3.43*  HGB 8.6* 8.3* 8.9*  HCT 29.2* 27.2* 29.4*  MCV 87.7 84.7 85.7  MCH 25.8* 25.9* 25.9*  MCHC 29.5* 30.5 30.3  RDW 17.1* 16.9* 17.5*  PLT 250 253 264    Cardiac EnzymesNo results for input(s): TROPONINI in the last 168 hours. No results for input(s): TROPIPOC in the last 168 hours.   BNP Recent Labs  Lab 11/04/23 0439  BNP 1,970.1*     DDimer No results for input(s): DDIMER in the last 168 hours.   Radiology    No results found.   Cardiac Studies   Echo See above.    Patient Profile     73 y.o. female with CAD, HTN, HLD, PAF, chronic diastolic heart failure, chronic respiratory failure, DM II, Squamous cell carcinoma s/p R lobectomy 20', tobacco abuse and depression. AHF team to see for acute combined systolic and diastolic heart failure and suspected low output HF.   Assessment & Plan    Acute combined systolic and diastolic heart failure:   Possible ischemic etiology.  Note the EF is 30 - 35% as above.  EF had  appeared to be 55% in 2020.  Holding on cath with increased creatinine.  Continuing IV diuresis and milrinone  started yesterday.  Net 8.6 cc.  CVP 8.   Co. ox 73.6.SABRA  Creatinine is essentially unchanged.  Liver enzymes continue to trend down.   Continue current diuresis and likely can decrease in the AM.   Hypokalemia: Creat supplemented.  Continue BID dosing.    CAD: Medical management.  Elevated trop consistent with demand ischemia.    Acute on CKD4:   Continue to follow.    Paroxysmal A-fib:    Maintaining sinus rhythm.  Continue p.o. amiodarone .  Continue heparin  for now.    COPD/history of lobectomy for lung cancer:   Continue support per primary team.    DM/HLP: Continue current therapy.   Anemia:   Hemoglobin up slightly.  No indication for transfusion.  Follow.  Recent GI work up with no evidence of bleeding.  She has iron  deficiency from April.  Will repeat iron  panel and give IV iron  based on results.   For questions or updates, please contact [facility] Please consult [email or web address] for contact info under Cardiology/STEMI.   Signed, Lynwood Schilling, MD  11/07/2023, 8:48 AM

## 2023-11-07 NOTE — Plan of Care (Signed)
  Problem: Education: Goal: Knowledge of General Education information will improve Description: Including pain rating scale, medication(s)/side effects and non-pharmacologic comfort measures Outcome: Progressing   Problem: Clinical Measurements: Goal: Diagnostic test results will improve Outcome: Progressing   Problem: Clinical Measurements: Goal: Cardiovascular complication will be avoided Outcome: Progressing   Problem: Clinical Measurements: Goal: Respiratory complications will improve Outcome: Progressing

## 2023-11-08 DIAGNOSIS — J9601 Acute respiratory failure with hypoxia: Secondary | ICD-10-CM | POA: Diagnosis not present

## 2023-11-08 DIAGNOSIS — E873 Alkalosis: Secondary | ICD-10-CM | POA: Diagnosis not present

## 2023-11-08 DIAGNOSIS — I5033 Acute on chronic diastolic (congestive) heart failure: Secondary | ICD-10-CM | POA: Diagnosis not present

## 2023-11-08 DIAGNOSIS — I5021 Acute systolic (congestive) heart failure: Secondary | ICD-10-CM | POA: Diagnosis not present

## 2023-11-08 DIAGNOSIS — J9602 Acute respiratory failure with hypercapnia: Secondary | ICD-10-CM

## 2023-11-08 LAB — CBC
HCT: 31.5 % — ABNORMAL LOW (ref 36.0–46.0)
Hemoglobin: 9.6 g/dL — ABNORMAL LOW (ref 12.0–15.0)
MCH: 25.7 pg — ABNORMAL LOW (ref 26.0–34.0)
MCHC: 30.5 g/dL (ref 30.0–36.0)
MCV: 84.5 fL (ref 80.0–100.0)
Platelets: 290 K/uL (ref 150–400)
RBC: 3.73 MIL/uL — ABNORMAL LOW (ref 3.87–5.11)
RDW: 17.6 % — ABNORMAL HIGH (ref 11.5–15.5)
WBC: 14.1 K/uL — ABNORMAL HIGH (ref 4.0–10.5)
nRBC: 0.2 % (ref 0.0–0.2)

## 2023-11-08 LAB — COMPREHENSIVE METABOLIC PANEL WITH GFR
ALT: 112 U/L — ABNORMAL HIGH (ref 0–44)
AST: 37 U/L (ref 15–41)
Albumin: 2.4 g/dL — ABNORMAL LOW (ref 3.5–5.0)
Alkaline Phosphatase: 124 U/L (ref 38–126)
Anion gap: 15 (ref 5–15)
BUN: 51 mg/dL — ABNORMAL HIGH (ref 8–23)
CO2: 44 mmol/L — ABNORMAL HIGH (ref 22–32)
Calcium: 9 mg/dL (ref 8.9–10.3)
Chloride: 76 mmol/L — ABNORMAL LOW (ref 98–111)
Creatinine, Ser: 2.08 mg/dL — ABNORMAL HIGH (ref 0.44–1.00)
GFR, Estimated: 25 mL/min — ABNORMAL LOW (ref >60)
Glucose, Bld: 157 mg/dL — ABNORMAL HIGH (ref 70–99)
Potassium: 3.3 mmol/L — ABNORMAL LOW (ref 3.5–5.1)
Sodium: 135 mmol/L (ref 135–145)
Total Bilirubin: 0.4 mg/dL (ref 0.0–1.2)
Total Protein: 6.3 g/dL — ABNORMAL LOW (ref 6.5–8.1)

## 2023-11-08 LAB — BLOOD GAS, ARTERIAL
Acid-Base Excess: 33.5 mmol/L — ABNORMAL HIGH (ref 0.0–2.0)
Bicarbonate: 62.1 mmol/L — ABNORMAL HIGH (ref 20.0–28.0)
Drawn by: 51185
O2 Saturation: 96.5 %
Patient temperature: 37
pCO2 arterial: 71 mmHg (ref 32–48)
pH, Arterial: 7.55 — ABNORMAL HIGH (ref 7.35–7.45)
pO2, Arterial: 71 mmHg — ABNORMAL LOW (ref 83–108)

## 2023-11-08 LAB — COOXEMETRY PANEL
Carboxyhemoglobin: 2.5 % — ABNORMAL HIGH (ref 0.5–1.5)
Carboxyhemoglobin: 2.3 % — ABNORMAL HIGH (ref 0.5–1.5)
Methemoglobin: <0.7 % (ref 0.0–1.5)
Methemoglobin: <0.7 % (ref 0.0–1.5)
O2 Saturation: 68.4 %
O2 Saturation: 68.9 %
Total hemoglobin: 9.5 g/dL — ABNORMAL LOW (ref 12.0–16.0)
Total hemoglobin: 10.4 g/dL — ABNORMAL LOW (ref 12.0–16.0)

## 2023-11-08 LAB — APTT: aPTT: 67 s — ABNORMAL HIGH (ref 24–36)

## 2023-11-08 LAB — MAGNESIUM: Magnesium: 2 mg/dL (ref 1.7–2.4)

## 2023-11-08 LAB — GLUCOSE, CAPILLARY
Glucose-Capillary: 166 mg/dL — ABNORMAL HIGH (ref 70–99)
Glucose-Capillary: 211 mg/dL — ABNORMAL HIGH (ref 70–99)
Glucose-Capillary: 156 mg/dL — ABNORMAL HIGH (ref 70–99)
Glucose-Capillary: 193 mg/dL — ABNORMAL HIGH (ref 70–99)

## 2023-11-08 LAB — AMMONIA: Ammonia: 41 umol/L — ABNORMAL HIGH (ref 9–35)

## 2023-11-08 LAB — HEPARIN LEVEL (UNFRACTIONATED): Heparin Unfractionated: 1.03 [IU]/mL — ABNORMAL HIGH (ref 0.30–0.70)

## 2023-11-08 MED ORDER — SODIUM CHLORIDE 0.9 % IV SOLN: INTRAVENOUS | Status: AC

## 2023-11-08 MED ORDER — POTASSIUM CHLORIDE 20 MEQ PO PACK
40.0000 meq | PACK | ORAL | Status: AC
  Administered 2023-11-08 (×4): 40 meq via ORAL
  Filled 2023-11-08 (×2): qty 2

## 2023-11-08 MED ORDER — HYDROCODONE-ACETAMINOPHEN 5-325 MG PO TABS
1.0000 | ORAL_TABLET | Freq: Two times a day (BID) | ORAL | Status: DC | PRN
  Administered 2023-11-09 (×2): 1 via ORAL
  Filled 2023-11-08 (×2): qty 1

## 2023-11-08 MED ORDER — MILRINONE LACTATE IN DEXTROSE 20-5 MG/100ML-% IV SOLN
0.1250 ug/kg/min | INTRAVENOUS | Status: DC
  Administered 2023-11-08 (×2): 0.125 ug/kg/min via INTRAVENOUS
  Filled 2023-11-08: qty 100

## 2023-11-08 MED ORDER — POTASSIUM CHLORIDE 20 MEQ PO PACK: 40.0000 meq | PACK | Freq: Two times a day (BID) | ORAL | Status: DC

## 2023-11-08 MED ORDER — ROSUVASTATIN CALCIUM 20 MG PO TABS
20.0000 mg | ORAL_TABLET | Freq: Every day | ORAL | Status: DC
  Administered 2023-11-09 – 2023-11-13 (×7): 20 mg via ORAL
  Filled 2023-11-08 (×5): qty 1

## 2023-11-08 MED ORDER — MILRINONE LACTATE IN DEXTROSE 20-5 MG/100ML-% IV SOLN: 0.1250 ug/kg/min | INTRAVENOUS | Status: DC

## 2023-11-08 MED ORDER — ACETAZOLAMIDE SODIUM 500 MG IJ SOLR
500.0000 mg | Freq: Two times a day (BID) | INTRAMUSCULAR | Status: DC
  Administered 2023-11-08 – 2023-11-09 (×4): 500 mg via INTRAVENOUS
  Filled 2023-11-08 (×3): qty 500

## 2023-11-08 MED ORDER — APIXABAN 5 MG PO TABS
5.0000 mg | ORAL_TABLET | Freq: Two times a day (BID) | ORAL | Status: DC
  Administered 2023-11-08 – 2023-11-09 (×8): 5 mg via ORAL
  Filled 2023-11-08 (×5): qty 1

## 2023-11-08 NOTE — Plan of Care (Signed)

## 2023-11-08 NOTE — Progress Notes (Signed)
 Progress Note  Patient Name: Maria Arroyo Date of Encounter: 11/08/2023  Primary Cardiologist:   Peter Swaz[place], MD   Subjective   Slightly somnolent today.  Up in chair.  Breathing better.   Inpatient Medications    Scheduled Meds:  amiodarone   200 mg Oral Daily   busPIRone   15 mg Oral BID   Chlorhexidine  Gluconate Cloth  6 each Topical Daily   ferrous sulfate   325 mg Oral Daily   hydrALAZINE   25 mg Oral TID   insulin  aspart  0-9 Units Subcutaneous TID WC   isosorbide  dinitrate  10 mg Oral TID   lidocaine   1 patch Transdermal Q24H   nicotine   21 mg Transdermal Daily   polyethylene glycol  17 g Oral Daily   [START ON 11/09/2023] rosuvastatin   20 mg Oral Daily   sodium chloride  flush  10-40 mL Intracatheter Q12H   sodium chloride  flush  3 mL Intravenous Q12H   venlafaxine  XR  150 mg Oral Daily   Continuous Infusions:  heparin  1,250 Units/hr (11/08/23 0620)   milrinone  0.25 mcg/kg/min (11/08/23 0240)   PRN Meds: acetaminophen  **OR** acetaminophen , albuterol , bisacodyl , bisacodyl , HYDROcodone -acetaminophen , melatonin, mouth rinse, sodium chloride  flush   Vital Signs    Vitals:   11/07/23 2205 11/08/23 0000 11/08/23 0001 11/08/23 0639  BP: 130/73 124/60    Pulse:   84   Resp:      Temp:  97.8 F (36.6 C)    TempSrc:  Oral    SpO2:   94%   Weight:    62.6 kg  Height:        Intake/Output Summary (Last 24 hours) at 11/08/2023 0802 Last data filed at 11/08/2023 0710 Gross per 24 hour  Intake 845.47 ml  Output 3100 ml  Net -2254.53 ml   Filed Weights   11/06/23 0447 11/07/23 0446 11/08/23 0639  Weight: 70.4 kg 67.7 kg 62.6 kg    Telemetry    NSR - Personally Reviewed  ECG    NA - Personally Reviewed  Physical Exam   GEN: No  acute distress.   Neck: No  JVD Cardiac: RRR, no murmurs, rubs, or gallops.  Respiratory:     Decreased breath sounds bilaterally.  GI: Soft, nontender, non-distended, normal bowel sounds  MS:  No edema; No  deformity. Neuro:   Nonfocal  Psych: Mildly confused.    Labs    Chemistry Recent Labs  Lab 11/06/23 0543 11/06/23 1534 11/07/23 0535 11/08/23 0556  NA 134* 134* 134* 135  K 2.7* 3.9 3.7 3.3*  CL 80* 77* 79* 76*  CO2 41* 43* 43* 44*  GLUCOSE 147* 188* 122* 157*  BUN 59* 61* 54* 51*  CREATININE 2.18* 2.06* 2.18* 2.08*  CALCIUM  8.8* 8.7* 8.7* 9.0  PROT 6.0*  --  5.9* 6.3*  ALBUMIN 2.6*  --  2.5* 2.4*  AST 108*  --  59* 37  ALT 236*  --  167* 112*  ALKPHOS 172*  --  151* 124  BILITOT 0.5  --  0.8 0.4  GFRNONAA 23* 25* 23* 25*  ANIONGAP 13 14 12 15      Hematology Recent Labs  Lab 11/06/23 0543 11/07/23 0535 11/08/23 0556  WBC 8.8 9.0 14.1*  RBC 3.21* 3.43* 3.73*  HGB 8.3* 8.9* 9.6*  HCT 27.2* 29.4* 31.5*  MCV 84.7 85.7 84.5  MCH 25.9* 25.9* 25.7*  MCHC 30.5 30.3 30.5  RDW 16.9* 17.5* 17.6*  PLT 253 264 290    Cardiac EnzymesNo results  for input(s): TROPONINI in the last 168 hours. No results for input(s): TROPIPOC in the last 168 hours.   BNP Recent Labs  Lab 11/04/23 0439  BNP 1,970.1*     DDimer No results for input(s): DDIMER in the last 168 hours.   Radiology    No results found.   Cardiac Studies   Echo See above.    Patient Profile     73 y.o. female with CAD, HTN, HLD, PAF, chronic diastolic heart failure, chronic respiratory failure, DM II, Squamous cell carcinoma s/p R lobectomy 20', tobacco abuse and depression. AHF team to see for acute combined systolic and diastolic heart failure and suspected low output HF.   Assessment & Plan    Acute combined systolic and diastolic heart failure:   Possible ischemic etiology.  Note the EF is 30 - 35% as above.  EF had appeared to be 55% in 2020.  Holding on cath with increased creatinine.  On IV diuresis and milrinone .  Net negative 10.6 liters.  Liver enzymes trending down.  Creat is stable.   CVP 7,   CoOx 68.4.  Will discontinue milrinone .    Hypokalemia:   Supplement with bid Kdur  today.  .   CAD:    Elevated trop likely related to demand ischemia.    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    Paroxysmal A-fib:    Maintaining sinus rhythm.  Continue p.o. amiodarone .  Change to Eliquis .   COPD/history of lobectomy for lung cancer  DM/HL   Confusion:  Reduce hydrocodone  for back pain.   Anemia:   Hemoglobin improving.  Iron  studies OK on PO iron  supplement.  No change in therapy.   For questions or updates, please contact [facility] Please consult [email or web address] for contact info under Cardiology/STEMI.   Signed, Lynwood Schilling, MD  11/08/2023, 8:02 AM

## 2023-11-08 NOTE — Evaluation (Signed)
 Occupational Therapy Evaluation Patient Details Name: Maria Arroyo MRN: [id number] DOB: [birth year] Today's Date: 11/08/2023   History of Present Illness   Patient is a 73 yo female presenting to the ED with SOB on 11/04/23. Admitted with acute exacerbation of HFrEF and also noted NSTEMI with CAD and elevated troponin. Metabolic aklalosis, hypokalemia and transaminitis also noted. PMH includes chronic hypoxic respiratory failure on 3 L nasal cannula at home, COPD, paroxysmal A-fib, chronic diastolic CHF, hypertension, hyperlipidemia, controlled type 2 diabetes, stage IIIB CKD, squamous cell cancer of right lung status post lobectomy in 2020     Clinical Impressions Prior to this admission, patient living with her huband, with her niece checking on her during the time her husband is at work. Patient typically walks with a walker or a cane, and recieves minimal help with ADLs or IADLs. Patient recevied in a lethargic state, and on 3L O2 (baseline). Patient with significant confusion, following one step commands less than 50% of the time, and needing mod A for all ADL management. Patient requiring mod A for bed mobility and could not attempt further assessment due to decreased command following (often looking off into the distance with no awareness of daughter's or OT's presence). Vital signs stable throughout assessment, with RN notified at end of session. Given previous level OT is recommending rehab of lesser intensity < 3 hours prior to discharge home. OT will continue to follow.      If plan is discharge home, recommend the following:   A lot of help with walking and/or transfers;A lot of help with bathing/dressing/bathroom;Assistance with feeding;Direct supervision/assist for medications management;Direct supervision/assist for financial management;Assist for transportation;Help with stairs or ramp for entrance;Supervision due to cognitive status;Assistance with cooking/housework      Functional Status Assessment   Patient has had a recent decline in their functional status and demonstrates the ability to make significant improvements in function in a reasonable and predictable amount of time.     Equipment Recommendations   None recommended by OT (patient has DME needed)     Recommendations for Other Services         Precautions/Restrictions   Precautions Precautions: Fall Recall of Precautions/Restrictions: Impaired Precaution/Restrictions Comments: watch HR and O2 Restrictions Weight Bearing Restrictions Per Provider Order: No     Mobility Bed Mobility Overal bed mobility: Needs Assistance Bed Mobility: Supine to Sit, Sit to Supine, Rolling Rolling: Mod assist   Supine to sit: Mod assist Sit to supine: Mod assist   General bed mobility comments: patient needing multi-modal cues to complete, unable to initiate indpendently, mod A for all movement but able to demonstrate sitting balance at CGA    Transfers Overall transfer level: Needs assistance                 General transfer comment: unable to assess due to cognition      Balance Overall balance assessment: Needs assistance Sitting-balance support: Bilateral upper extremity supported, Feet supported Sitting balance-Leahy Scale: Fair                                     ADL either performed or assessed with clinical judgement   ADL Overall ADL's : Needs assistance/impaired Eating/Feeding: Moderate assistance;Sitting   Grooming: Moderate assistance;Sitting   Upper Body Bathing: Moderate assistance;Sitting   Lower Body Bathing: Total assistance;Sit to/from stand;Sitting/lateral leans   Upper Body Dressing : Moderate assistance;Sitting   Lower  Body Dressing: Total assistance;Sitting/lateral leans;Bed level     Toilet Transfer Details (indicate cue type and reason): unable to assess due to cognition   Toileting - Clothing Manipulation Details (indicate  cue type and reason): unable to assess due to cognition     Functional mobility during ADLs: Moderate assistance;Cueing for safety;Cueing for sequencing General ADL Comments: Prior to this admission, patient living with her huband, with her niece checking on her during the time her husband is at work. Patient typically walks with a walker or a cane, and recieves minimal help with ADLs or IADLs. Patient recevied in a lethargic state, and on 3L O2 (baseline). Patient with significant confusion, following one step commands less than 50% of the time, and needing mod A for all ADL management. Patient requiring mod A for bed mobility and could not attempt further assessment due to decreased command following. Vital signs stable throughout assessment, with RN notified at end of session. Given previous level OT is recommending rehab of lesser intensity < 3 hours prior to discharge home. OT will continue to follow.     Vision Baseline Vision/History: 1 Wears glasses Ability to See in Adequate Light: 0 Adequate Patient Visual Report: No change from baseline Vision Assessment?: No apparent visual deficits     Perception Perception: Not tested       Praxis Praxis: Not tested       Pertinent Vitals/Pain Pain Assessment Pain Assessment: Faces Faces Pain Scale: Hurts even more Pain Location: back Pain Descriptors / Indicators: Grimacing, Guarding, Discomfort Pain Intervention(s): Limited activity within patient's tolerance, Monitored during session, Repositioned     Extremity/Trunk Assessment Upper Extremity Assessment Upper Extremity Assessment: Generalized weakness;Left hand dominant   Lower Extremity Assessment Lower Extremity Assessment: Defer to PT evaluation   Cervical / Trunk Assessment Cervical / Trunk Assessment: Kyphotic (minimally)   Communication Communication Communication: Impaired Factors Affecting Communication: Reduced clarity of speech   Cognition Arousal:  Lethargic Behavior During Therapy: Flat affect Cognition: Cognition impaired   Orientation impairments: Time, Situation Awareness: Intellectual awareness impaired, Online awareness impaired Memory impairment (select all impairments): Short-term memory, Declarative long-term memory, Working memory, Non-declarative long-term memory Attention impairment (select first level of impairment): Focused attention Executive functioning impairment (select all impairments): Initiation, Organization, Sequencing, Reasoning, Problem solving OT - Cognition Comments: Significant cognitive impairment, staring off in the distance, perseverative on the same word even if different commands were presented, followed commands less than 50% of the time despite clear instruction and multi-modal cues                 Following commands: Impaired Following commands impaired: Follows one step commands inconsistently, Follows multi-step commands inconsistently     Cueing  General Comments   Cueing Techniques: Verbal cues;Tactile cues;Visual cues;Gestural cues  VSS and RN notified of cognition   Exercises     Shoulder Instructions      Home Living Family/patient expects to be discharged to:: Private residence Living Arrangements: Spouse/significant other Available Help at Discharge: Family;Available PRN/intermittently (Husband works, niece checks on her during the day) Type of Home: House Home Access: Stairs to enter [employer] of Steps: 3 in front, 5 in back Entrance Stairs-Rails: Right Home Layout: One level     Bathroom Shower/Tub: Producer, television/film/video: Handicapped height Bathroom Accessibility: Yes How Accessible: Accessible via walker Home Equipment: Cane - single point;Grab bars - tub/shower;Shower seat;BSC/3in1;Rollator (4 wheels);Hand held shower head          Prior Functioning/Environment Prior  Level of Function : Independent/Modified Independent              Mobility Comments: Uses rollator, denies falls ADLs Comments: niece assists with iADLs and ADLs as needed    OT Problem List: Decreased strength;Decreased range of motion;Decreased activity tolerance;Impaired balance (sitting and/or standing);Decreased coordination;Decreased cognition;Decreased safety awareness;Impaired UE functional use;Pain;Increased edema;Cardiopulmonary status limiting activity   OT Treatment/Interventions: Self-care/ADL training;Therapeutic exercise;Energy conservation;DME and/or AE instruction;Manual therapy;Modalities;Therapeutic activities;Patient/family education;Balance training;Cognitive remediation/compensation      OT Goals(Current goals can be found in the care plan section)   Acute Rehab OT Goals Patient Stated Goal: unable OT Goal Formulation: With patient/family Time For Goal Achievement: 11/22/23 Potential to Achieve Goals: Good ADL Goals Pt Will Perform Lower Body Bathing: with contact guard assist;sitting/lateral leans;sit to/from stand Pt Will Perform Lower Body Dressing: with contact guard assist;sitting/lateral leans;sit to/from stand Pt Will Transfer to Toilet: with contact guard assist;ambulating Pt Will Perform Toileting - Clothing Manipulation and hygiene: with contact guard assist;sitting/lateral leans;sit to/from stand Additional ADL Goal #1: Patient will be able to follow 2-3 step commands consistently as a precursor to upper level cognitive tasks. Additional ADL Goal #2: Patient will be able to complete functional task in standing for 3 minutes in order to increase activity tolerance.   OT Frequency:  Min 2X/week    Co-evaluation              AM-PAC OT 6 Clicks Daily Activity     Outcome Measure Help from another person eating meals?: A Lot Help from another person taking care of personal grooming?: A Lot Help from another person toileting, which includes using toliet, bedpan, or urinal?: A Lot Help from another person  bathing (including washing, rinsing, drying)?: A Lot Help from another person to put on and taking off regular upper body clothing?: A Lot Help from another person to put on and taking off regular lower body clothing?: A Lot 6 Click Score: 12   End of Session Nurse Communication: Mobility status;Other (comment) (Cognition)  Activity Tolerance: Patient limited by lethargy Patient left: in bed;with call bell/phone within reach;with family/visitor present  OT Visit Diagnosis: Unsteadiness on feet (R26.81);Other abnormalities of gait and mobility (R26.89);Muscle weakness (generalized) (M62.81);Other symptoms and signs involving cognitive function;Pain Pain - part of body:  (Back)                Time: 8574-8552 OT Time Calculation (min): 22 min Charges:  OT General Charges $OT Visit: 1 Visit OT Evaluation $OT Eval Moderate Complexity: 1 Mod  Ronal Gift E. Jakin Pavao, OTR/L Acute Rehabilitation Services [phone]   Ronal Gift Salt 11/08/2023, 3:22 PM

## 2023-11-08 NOTE — Plan of Care (Signed)
  Problem: Education: Goal: Knowledge of General Education information will improve Description: Including pain rating scale, medication(s)/side effects and non-pharmacologic comfort measures Outcome: Progressing   Problem: Clinical Measurements: Goal: Diagnostic test results will improve Outcome: Progressing   Problem: Clinical Measurements: Goal: Cardiovascular complication will be avoided Outcome: Progressing   

## 2023-11-08 NOTE — Progress Notes (Addendum)
 Progress Note   Patient: Maria Arroyo FMW:[id number] DOB: 1950/06/09 DOA: 11/04/2023  DOS: the patient was seen and examined on 11/08/2023   Brief hospital course:  73 y.o. 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    Assessment and Plan:   Acute metabolic encephalopathy - Seems more lethargic this morning.  Cardiology ordering stat ABG which showed hypercapnia and metabolic alkalemia.  See below.  Will monitor closely.  Acute hypoxic and hypercapnic respiratory failure - Currently requiring 3L nasal cannula (pt's baseline).  Patient more lethargic this morning, ABG ordered subsequently showing 7.55/71/71.  Given hypercapnia, will place on BiPAP for short time.  Continue to wean O2 as tolerated.  O2 saturation goal 88-92.   Acute exacerbation of HFrEF - Echo noting newly reduced EF 30-35%, biventricular reduced EF, severely dilated BAE.  Etiology likely worsening underlying CAD.  Cardiology/heart failure closely.  Showing markedly improved response to increase of diuretics yesterday, diuresing greater than 10 L total since presentation.  Starting to appear intravascularly dry.  Holding IV Lasix .  Weaning/discontinuing milrinone  per cardiology.  Monitor urine output closely.  Recheck BMP and magnesium  in AM.   NSTEMI with underlying CAD - Troponin elevation up to 1139 downtrending this morning..  Cardiology following closely.  Likely ongoing demand ischemia in the setting of worsening CHF exacerbation.  Underlying CAD with previous LAD stenting and probable stenosis of LAD.   Currently not pursuing interventional cardiac catheterization this time due to kidney dysfunction.   Acute kidney injury on CKD 4 - Creatinine similar to yesterday, likely at patient's baseline.  Continue to monitor urine output.  Diuretics on hold today.  Recheck BMP and magnesium  in AM.   Mixed acid-base disorder - ABG this morning 7.55/71/71.  Likely mixed in the setting of chronic hypercapnic respiratory failure and contraction alkalosis related with diuresis.  Bicarb continues to be greater than 40 this morning.  Will initiate NS at 50 mL/h.  Initiate Diamox  500 mg IV twice daily.  Recheck BMP in AM.   Hypokalemia - Exacerbated by diuresis.  Aggressive replenishment on board.  Will recheck BMP in AM.   Transaminitis - Likely from congestion of HFrEF.  LFTs showing improvement this morning.  Will recheck in AM.   Diabetes mellitus - Insulin  sliding scale on board.   Iron  deficiency anemia - No overt bleeding.  No active bleeding appreciated.  Showing initial downtrend, improved this morning however.  Will transfuse if hemoglobin less than 8 secondary to cardiac history above.  Will recheck hemoglobin in AM.  Appears stable at this time.  Chronic atrial fibrillation - Amiodarone  on board.  Transition to Eliquis  p.o. today.   History of squamous cell carcinoma of the right lung - S/p right lobectomy in 2020.   Tobacco abuse - Still smokes.  Encouraging smoking cessation.  Nicotine  patch offered.   Obesity class I - BMI nearly 32.  Encouraged lifestyle modification and weight loss.   Subjective: Patient appears more lethargic this morning.  Patient's daughter at bedside, provided update to her directly.  Did well with diuresis.  Diuretics discontinued/held this morning.  Denies any fever, worsening shortness of breath, chest pain, nausea, vomiting, abdominal pain.  Physical Exam:  Vitals:   11/07/23 2205 11/08/23 0000 11/08/23 0001 11/08/23 0639  BP: 130/73 124/60    Pulse:    84   Resp:      Temp:  97.8 F (36.6 C)    TempSrc:  Oral    SpO2:   94%   Weight:    62.6 kg  Height:        GENERAL:  Alert, lethargic, no acute distress  HEENT:  EOMI, nasal cannula CARDIOVASCULAR:  RRR, systolic murmur appreciated RESPIRATORY: Poor air movement GASTROINTESTINAL:  Soft, nontender, nondistended EXTREMITIES: Trace BL LE pitting edema NEURO:  No new focal deficits appreciated SKIN:  No rashes noted PSYCH:  Appropriate mood and affect    Data Reviewed:  Imaging Studies: US  EKG SITE RITE Result Date: 11/05/2023 If Site Rite image not attached, placement could not be confirmed due to current cardiac rhythm.  ECHOCARDIOGRAM COMPLETE Result Date: 11/04/2023    ECHOCARDIOGRAM REPORT   Patient Name:   GABBRIELLE Arroyo Date of Exam: 11/04/2023 Medical Rec #:  [id number]                  Height:       59.0 in Accession #:    [id number]                 Weight:       155.4 lb Date of Birth:  1950-04-09                  BSA:          1.657 m Patient Age:    72 years                   BP:           132/80 mmHg Patient Gender: F                          HR:           70 bpm. Exam Location:  Inpatient Procedure: 2D Echo, Color Doppler, Cardiac Doppler and Intracardiac            Opacification Agent (Both Spectral and Color Flow Doppler were            utilized during procedure). Indications:    I50.31 Acute diastolic (congestive) heart failure  History:        Patient has prior history of Echocardiogram examinations, most                 recent 08/27/2018. CHF, CAD, COPD; Risk Factors:Hypertension and                 Dyslipidemia.  Sonographer:    Damien Senior RDCS Referring Phys: [id number] EVA KATHEE PORE IMPRESSIONS  1. Left ventricular ejection fraction, by estimation, is 30 to 35%. The left ventricle has moderately decreased function. Left ventricular endocardial border not optimally defined to evaluate regional wall motion. The left ventricular internal cavity size was mildly  dilated. Left ventricular diastolic parameters are consistent with Grade II diastolic dysfunction (pseudonormalization). Elevated left atrial pressure.  2. Right ventricular systolic function is moderately reduced. The right ventricular size is moderately enlarged.  3. Left atrial size was severely dilated.  4. Right atrial size was severely dilated.  5. The mitral valve is degenerative. Mild to moderate mitral valve regurgitation. No evidence of mitral stenosis. Moderate mitral annular calcification.  6. Tricuspid valve regurgitation is moderate.  7. The  aortic valve is calcified. There is moderate calcification of the aortic valve. There is moderate thickening of the aortic valve. Aortic valve regurgitation is not visualized. Aortic valve sclerosis/calcification is present, without any evidence of aortic stenosis.  8. The inferior vena cava is normal in size with greater than 50% respiratory variability, suggesting right atrial pressure of 3 mmHg.  9. Very poor acoustical windows limit ability to detect focal wall motion abnormalities. The estimated EF may be underestimated due to poor images even with definity  contrast. FINDINGS  Left Ventricle: Left ventricular ejection fraction, by estimation, is 30 to 35%. The left ventricle has moderately decreased function. Left ventricular endocardial border not optimally defined to evaluate regional wall motion. Definity  contrast agent was given IV to delineate the left ventricular endocardial borders. The left ventricular internal cavity size was mildly dilated. There is no left ventricular hypertrophy. Abnormal (paradoxical) septal motion, consistent with left bundle branch block. Left ventricular diastolic parameters are consistent with Grade II diastolic dysfunction (pseudonormalization). Elevated left atrial pressure. Right Ventricle: The right ventricular size is moderately enlarged. No increase in right ventricular wall thickness. Right ventricular systolic function  is moderately reduced. Left Atrium: Left atrial size was severely dilated. Right Atrium: Right atrial size was severely dilated. Pericardium: There is no evidence of pericardial effusion. Mitral Valve: The mitral valve is degenerative in appearance. There is mild thickening of the mitral valve leaflet(s). There is mild calcification of the mitral valve leaflet(s). Moderate mitral annular calcification. Mild to moderate mitral valve regurgitation. No evidence of mitral valve stenosis. Tricuspid Valve: The tricuspid valve is normal in structure. Tricuspid valve regurgitation is moderate . No evidence of tricuspid stenosis. Aortic Valve: The aortic valve is calcified. There is moderate calcification of the aortic valve. There is moderate thickening of the aortic valve. Aortic valve regurgitation is not visualized. Aortic valve sclerosis/calcification is present, without any  evidence of aortic stenosis. Pulmonic Valve: The pulmonic valve was normal in structure. Pulmonic valve regurgitation is trivial. No evidence of pulmonic stenosis. Aorta: The aortic root is normal in size and structure. Venous: The inferior vena cava is normal in size with greater than 50% respiratory variability, suggesting right atrial pressure of 3 mmHg. IAS/Shunts: No atrial level shunt detected by color flow Doppler.  LEFT VENTRICLE PLAX 2D LVIDd:         5.80 cm      Diastology LVIDs:         5.10 cm      LV e' medial:    3.48 cm/s LV PW:         1.00 cm      LV E/e' medial:  17.4 LV IVS:        0.90 cm      LV e' lateral:   5.11 cm/s LVOT diam:     2.10 cm      LV E/e' lateral: 11.8 LV SV:         40 LV SV Index:   24 LVOT Area:     3.46 cm  LV Volumes (MOD) LV vol d, MOD A2C: 196.0 ml LV vol d, MOD A4C: 202.0 ml LV vol s, MOD A2C: 121.0 ml LV vol s, MOD A4C: 147.0 ml LV SV MOD A2C:     75.0 ml LV SV MOD A4C:     202.0 ml LV SV MOD BP:      67.3 ml RIGHT VENTRICLE RV S prime:     11.00 cm/s TAPSE (M-mode): 2.3 cm LEFT  ATRIUM               Index        RIGHT ATRIUM           Index LA diam:        5.50 cm  3.32 cm/m   RA Area:     40.80 cm LA Vol (A2C):   103.0 ml 62.16 ml/m  RA Volume:   184.00 ml 111.05 ml/m LA Vol (A4C):   96.0 ml  57.94 ml/m LA Biplane Vol: 99.9 ml  60.29 ml/m  AORTIC VALVE LVOT Vmax:   85.60 cm/s LVOT Vmean:  53.000 cm/s LVOT VTI:    0.115 m  AORTA Ao Root diam: 3.10 cm Ao Asc diam:  3.00 cm MITRAL VALVE               TRICUSPID VALVE MV Area (PHT): 2.76 cm    TR Peak grad:   26.4 mmHg MV Decel Time: 275 msec    TR Vmax:        257.00 cm/s MV E velocity: 60.40 cm/s MV A velocity: 73.30 cm/s  SHUNTS MV E/A ratio:  0.82        Systemic VTI:  0.12 m                            Systemic Diam: 2.10 cm Wilbert Bihari MD Electronically signed by Wilbert Bihari MD Signature Date/Time: 11/04/2023/5:34:51 PM    Final    DG Ribs Unilateral Right Result Date: 11/04/2023 CLINICAL DATA:  Fall with persistent right rib pain. EXAM: RIGHT RIBS - 2 VIEW COMPARISON:  Chest radiograph earlier today, chest CT 03/03/2022 FINDINGS: There are minimally displaced fractures of posterolateral sixth and seventh ribs, as well as anterior tenth rib. Fracture of anterior right ninth rib has surrounding callus formation and is likely subacute or chronic. Right pleural effusion is again seen, similar to radiograph earlier today. No pneumothorax. IMPRESSION: 1. Minimally displaced fractures of posterolateral right sixth and seventh ribs, as well as anterior tenth rib. 2. Fracture of anterior right ninth rib has surrounding callus formation and is likely subacute or chronic. 3. Right pleural effusion, similar to radiograph earlier today. Electronically Signed   By: Andrea Gasman M.D.   On: 11/04/2023 14:16   DG Chest Port 1 View Result Date: 11/04/2023 CLINICAL DATA:  Shortness of breath. EXAM: PORTABLE CHEST 1 VIEW COMPARISON:  Portable chest 08/11/2023 FINDINGS: 4:51 a.m. The heart is enlarged. There is increased central vascular prominence, increased mild  diffuse interstitial edema. There is an increased now moderate right pleural effusion a small left pleural effusion. There is overlying atelectasis or consolidation in the right lower lung field. Patchy perihilar hazy opacity is also seen and probably due to ground-glass edema. The aorta is tortuous and calcified, with stable mediastinum. Partially visible fusion plating lower cervical spine is again shown. No new osseous abnormality. There is degenerative change of the thoracic spine. IMPRESSION: 1. Cardiomegaly with increased central vascular prominence and mild diffuse interstitial edema. 2. Increased now moderate right pleural effusion and small left pleural effusion. 3. Overlying atelectasis or consolidation in the right lower lung field. 4. Patchy perihilar hazy opacity is also seen and probably due to ground-glass edema. Electronically Signed   By: Francis Quam M.D.   On: 11/04/2023 05:07    There are no new results to review at this time.  Previous records (including but not limited to H&P, progress  notes, nursing notes, TOC management) were reviewed in assessment of this patient.  Labs: CBC: Recent Labs  Lab 11/04/23 0439 11/04/23 0450 11/05/23 0233 11/06/23 0543 11/07/23 0535 11/08/23 0556  WBC 11.0*  --  8.0 8.8 9.0 14.1*  HGB 8.8* 10.2* 8.6* 8.3* 8.9* 9.6*  HCT 29.5* 30.0* 29.2* 27.2* 29.4* 31.5*  MCV 88.3  --  87.7 84.7 85.7 84.5  PLT 266  --  250 253 264 290   Basic Metabolic Panel: Recent Labs  Lab 11/04/23 0439 11/04/23 0450 11/05/23 0233 11/06/23 0543 11/06/23 1534 11/07/23 0535 11/08/23 0556  NA 135   < > 137 134* 134* 134* 135  K 5.0   < > 4.9 2.7* 3.9 3.7 3.3*  CL 88*  --  87* 80* 77* 79* 76*  CO2 33*  --  37* 41* 43* 43* 44*  GLUCOSE 169*  --  143* 147* 188* 122* 157*  BUN 53*  --  59* 59* 61* 54* 51*  CREATININE 2.26*  --  2.39* 2.18* 2.06* 2.18* 2.08*  CALCIUM  9.0  --  9.1 8.8* 8.7* 8.7* 9.0  MG 2.7*  --   --  2.3  --  2.0 2.0   < > = values in this  interval not displayed.   Liver Function Tests: Recent Labs  Lab 11/04/23 1412 11/06/23 0543 11/07/23 0535 11/08/23 0556  AST 241* 108* 59* 37  ALT 309* 236* 167* 112*  ALKPHOS 159* 172* 151* 124  BILITOT 0.7 0.5 0.8 0.4  PROT 6.4* 6.0* 5.9* 6.3*  ALBUMIN 2.7* 2.6* 2.5* 2.4*   CBG: Recent Labs  Lab 11/07/23 1110 11/07/23 1547 11/07/23 2100 11/08/23 0622 11/08/23 1107  GLUCAP 228* 181* 167* 166* 211*    Scheduled Meds:  amiodarone   200 mg Oral Daily   apixaban   5 mg Oral BID   busPIRone   15 mg Oral BID   Chlorhexidine  Gluconate Cloth  6 each Topical Daily   ferrous sulfate   325 mg Oral Daily   hydrALAZINE   25 mg Oral TID   insulin  aspart  0-9 Units Subcutaneous TID WC   isosorbide  dinitrate  10 mg Oral TID   lidocaine   1 patch Transdermal Q24H   nicotine   21 mg Transdermal Daily   polyethylene glycol  17 g Oral Daily   potassium chloride   40 mEq Oral Q3H   [START ON 11/09/2023] rosuvastatin   20 mg Oral Daily   sodium chloride  flush  10-40 mL Intracatheter Q12H   sodium chloride  flush  3 mL Intravenous Q12H   venlafaxine  XR  150 mg Oral Daily   Continuous Infusions:  milrinone  0.125 mcg/kg/min (11/08/23 1103)   PRN Meds:.acetaminophen  **OR** acetaminophen , albuterol , bisacodyl , bisacodyl , HYDROcodone -acetaminophen , melatonin, mouth rinse, sodium chloride  flush  Family Communication: Daughter at bedside  Disposition: Status is: Inpatient Remains inpatient appropriate because: CHF exacerbation     Time spent: 56 minutes  Length of inpatient stay: 4 days  Author: Carliss LELON Canales, DO 11/08/2023 11:29 AM  For on call review [email or web address].

## 2023-11-08 NOTE — Progress Notes (Addendum)
 Advanced Heart Failure Rounding Note  Cardiologist: Peter [place], MD  HF Cardiologist: Dr. Rolan  Chief Complaint: CHF  Subjective:    Coox. 68.4%. CVP 3 on Milrinone  0.25 mcg/kg/min Overall net -10.5L and down 22 lbs  Lying in bed. Reports feeling fine. No SOB or CP.   Objective:    Weight Range: 62.6 kg Body mass index is 27.87 kg/m.   Vital Signs:   Temp:  [97.6 F (36.4 C)-98.1 F (36.7 C)] 97.8 F (36.6 C) (08/11 0000) Pulse Rate:  [49-84] 84 (08/11 0001) Resp:  [17-20] 20 (08/10 2011) BP: (124-149)/(60-98) 124/60 (08/11 0000) SpO2:  [94 %-100 %] 94 % (08/11 0001) Weight:  [62.6 kg] 62.6 kg (08/11 0639) Last BM Date : 11/07/23  Weight change: Filed Weights   11/06/23 0447 11/07/23 0446 11/08/23 0639  Weight: 70.4 kg 67.7 kg 62.6 kg   Intake/Output:  Intake/Output Summary (Last 24 hours) at 11/08/2023 0742 Last data filed at 11/07/2023 2205 Gross per 24 hour  Intake 845.47 ml  Output 2800 ml  Net -1954.53 ml    Physical Exam    General: Well appearing. No distress Cardiac: JVP flat. S1 and S2 present. No murmurs or rub. Extremities: Warm and dry.  Trace BLE edema. + UNNA boots Neuro: Alert and oriented x3. Affect pleasant.   Telemetry   SR in 80s with 1AVB and LBBB (personally reviewed)  Labs    CBC Recent Labs    11/07/23 0535 11/08/23 0556  WBC 9.0 14.1*  HGB 8.9* 9.6*  HCT 29.4* 31.5*  MCV 85.7 84.5  PLT 264 290   Basic Metabolic Panel Recent Labs    91/89/74 0535 11/08/23 0556  NA 134* 135  K 3.7 3.3*  CL 79* 76*  CO2 43* 44*  GLUCOSE 122* 157*  BUN 54* 51*  CREATININE 2.18* 2.08*  CALCIUM  8.7* 9.0  MG 2.0 2.0   Liver Function Tests Recent Labs    11/07/23 0535 11/08/23 0556  AST 59* 37  ALT 167* 112*  ALKPHOS 151* 124  BILITOT 0.8 0.4  PROT 5.9* 6.3*  ALBUMIN 2.5* 2.4*   BNP (last 3 results) Recent Labs    07/02/23 0712 07/15/23 1052 11/04/23 0439  BNP 1,153.7* 967.4* 1,970.1*   Fasting Lipid  Panel Recent Labs    11/06/23 0544  CHOL 86  HDL 43  LDLCALC 31  TRIG 59  CHOLHDL 2.0   Medications:    Scheduled Medications:  acetaZOLAMIDE  ER  500 mg Oral Q12H   amiodarone   200 mg Oral Daily   busPIRone   15 mg Oral BID   Chlorhexidine  Gluconate Cloth  6 each Topical Daily   ferrous sulfate   325 mg Oral Daily   hydrALAZINE   25 mg Oral TID   insulin  aspart  0-9 Units Subcutaneous TID WC   isosorbide  dinitrate  10 mg Oral TID   lidocaine   1 patch Transdermal Q24H   nicotine   21 mg Transdermal Daily   polyethylene glycol  17 g Oral Daily   sodium chloride  flush  10-40 mL Intracatheter Q12H   sodium chloride  flush  3 mL Intravenous Q12H   venlafaxine  XR  150 mg Oral Daily    Infusions:  furosemide  120 mg (11/07/23 1755)   heparin  1,250 Units/hr (11/08/23 0620)   milrinone  0.25 mcg/kg/min (11/08/23 0240)    PRN Medications: acetaminophen  **OR** acetaminophen , albuterol , bisacodyl , bisacodyl , HYDROcodone -acetaminophen , melatonin, mouth rinse, sodium chloride  flush  Patient Profile   Maria Arroyo is a 73 y.o. female  with CAD, HTN, HLD, PAF, chronic diastolic heart failure, chronic respiratory failure, DM II, Squamous cell carcinoma s/p R lobectomy 20', tobacco abuse and depression. AHF team to see for acute combined systolic and diastolic heart failure and suspected low output HF.   Assessment/Plan   Acute combined systolic and diastolic heart failure, iCM - Echo 5/20: EF 55-60%, RV mod reduced, mild-mod MR - Echo 11/04/23: EF 30-35%, GIIDD, RV mod reduced, RA/LA severely enlarged. Mild-Mod MR, mod TR.  - NYHA IV on admission - Coox 68; wean milrinone  to 0.125 mcg/kg/min. Repeat coox this afternoon.  - CVP 2-4. Diuresing well with IV Lasix  + diamox . CO2 44. Hold today - GDMT limited by AKI at this time, will add as renal function improves - continue hydral/imdur - UNNA boots in place   CAD - s/p stenting of the mid RCA 02/1998. Proximal RCA was stented  in 12/2000.03/2009 she had a new high-grade stenosis in the mid RCA between the prior stents, this was stented.  - CT Chest 7/21 with 3v CAD. Cath 2011 stents widely patent. Cath 2013 unchanged. - Elevated trops d/t demand ischemia - Denies CP - Continue hydral/isordil  TID - would defer repeat cath at this time with elevated renal function   AKI on CKD stage 4 - Baseline SCr appears to be around 1.5-1.7 - Up to 2.39 with diuresis; 2.09 today - avoid hypotension   PAF - Remains in NSR on tele - continue heparin  gtt, on eliquis  at home - continue amiodarone  200 mg daily   COPD Hx of lobectomy for lung cancer Chronic respiratory failure - per primary team - stable on [place]  - suspect CO2 chronically high with COPD, would not drive down    Elevated LFTS - Suspect in the setting of hepatic congestion - Improving - resume statin tomorrow  Length of Stay: 4  [place] Lee, NP  11/08/2023, 7:42 AM  Advanced Heart Failure Team Pager [phone] (M-F; 7a - 5p)  Please contact [facility] for night-coverage after hours (5p -7a ) and weekends on [email or web address]  Patient seen and examined with the above-signed Advanced Practice Provider and/or Housestaff. I personally reviewed laboratory data, imaging studies and relevant notes. I independently examined the patient and formulated the important aspects of the plan. I have edited the note to reflect any of my changes or salient points. I have personally discussed the plan with the patient and/or family.  She is confused today. Able to recognize family at bedside but not oriented to place or time.   Now on milrinone  0.125 (decreased from 0.25 this am). Co-ox ok CVP low  General:  Chronically ill appearing. No resp difficulty. confused HEENT: normal Neck: supple. no JVD. Carotids 2+ bilat; no bruits. No lymphadenopathy or thryomegaly appreciated. Cor: PMI nondisplaced. Regular rate & rhythm. No rubs, gallops or murmurs. Lungs: clear Abdomen: obese  soft, nontender, nondistended. Extremities: no cyanosis, clubbing, rash, edema Neuro: confused. weak. nonfocal  Volume status improved with IV diuresis. Co-ox ok. Milrinone  weaned to 0.125 today. If co-ox >= 55% would stop milrinone  tomorrow. Hold diuretics for now. Continue to adjust GDMT as tolerated. Suspect delirium due to pain meds. Will check ABG and ammonia as well.   I spoke with Dr. Lavona who will resume her care tomorrow. She is not a candidate for advanced HF therapies.   Toribio Fuel, MD  12:09 PM

## 2023-11-08 NOTE — Progress Notes (Signed)
 Orthopedic Tech Progress Note Patient Details:  Maria Arroyo Aug 22, 1950 [id number]  Ortho Devices Type of Ortho Device: Ace wrap, Unna boot Ortho Device/Splint Location: BLE Ortho Device/Splint Interventions: Ordered, Application   Post Interventions Patient Tolerated: Well Instructions Provided: Care of device  Delanna LITTIE Pac 11/08/2023, 3:40 PM

## 2023-11-09 ENCOUNTER — Inpatient Hospital Stay (HOSPITAL_COMMUNITY)

## 2023-11-09 DIAGNOSIS — I5033 Acute on chronic diastolic (congestive) heart failure: Secondary | ICD-10-CM | POA: Diagnosis not present

## 2023-11-09 DIAGNOSIS — I5021 Acute systolic (congestive) heart failure: Secondary | ICD-10-CM | POA: Diagnosis not present

## 2023-11-09 DIAGNOSIS — G9341 Metabolic encephalopathy: Secondary | ICD-10-CM

## 2023-11-09 DIAGNOSIS — N179 Acute kidney failure, unspecified: Secondary | ICD-10-CM | POA: Diagnosis not present

## 2023-11-09 DIAGNOSIS — I480 Paroxysmal atrial fibrillation: Secondary | ICD-10-CM

## 2023-11-09 LAB — COOXEMETRY PANEL
Carboxyhemoglobin: 2.5 % — ABNORMAL HIGH (ref 0.5–1.5)
Methemoglobin: <0.7 % (ref 0.0–1.5)
O2 Saturation: 78.4 %
Total hemoglobin: 9.4 g/dL — ABNORMAL LOW (ref 12.0–16.0)

## 2023-11-09 LAB — COMPREHENSIVE METABOLIC PANEL WITH GFR
ALT: 78 U/L — ABNORMAL HIGH (ref 0–44)
AST: 35 U/L (ref 15–41)
Albumin: 2.3 g/dL — ABNORMAL LOW (ref 3.5–5.0)
Alkaline Phosphatase: 110 U/L (ref 38–126)
Anion gap: 14 (ref 5–15)
BUN: 50 mg/dL — ABNORMAL HIGH (ref 8–23)
CO2: 39 mmol/L — ABNORMAL HIGH (ref 22–32)
Calcium: 9 mg/dL (ref 8.9–10.3)
Chloride: 82 mmol/L — ABNORMAL LOW (ref 98–111)
Creatinine, Ser: 1.91 mg/dL — ABNORMAL HIGH (ref 0.44–1.00)
GFR, Estimated: 28 mL/min — ABNORMAL LOW (ref >60)
Glucose, Bld: 149 mg/dL — ABNORMAL HIGH (ref 70–99)
Potassium: 3.1 mmol/L — ABNORMAL LOW (ref 3.5–5.1)
Sodium: 135 mmol/L (ref 135–145)
Total Bilirubin: 0.6 mg/dL (ref 0.0–1.2)
Total Protein: 6 g/dL — ABNORMAL LOW (ref 6.5–8.1)

## 2023-11-09 LAB — CBC
HCT: 29.7 % — ABNORMAL LOW (ref 36.0–46.0)
Hemoglobin: 8.9 g/dL — ABNORMAL LOW (ref 12.0–15.0)
MCH: 25.7 pg — ABNORMAL LOW (ref 26.0–34.0)
MCHC: 30 g/dL (ref 30.0–36.0)
MCV: 85.8 fL (ref 80.0–100.0)
Platelets: 276 K/uL (ref 150–400)
RBC: 3.46 MIL/uL — ABNORMAL LOW (ref 3.87–5.11)
RDW: 17.9 % — ABNORMAL HIGH (ref 11.5–15.5)
WBC: 19.6 K/uL — ABNORMAL HIGH (ref 4.0–10.5)
nRBC: 0.1 % (ref 0.0–0.2)

## 2023-11-09 LAB — URINALYSIS, ROUTINE W REFLEX MICROSCOPIC
Bilirubin Urine: NEGATIVE
Glucose, UA: NEGATIVE mg/dL
Hgb urine dipstick: NEGATIVE
Ketones, ur: NEGATIVE mg/dL
Leukocytes,Ua: NEGATIVE
Nitrite: POSITIVE — AB
Protein, ur: 30 mg/dL — AB
Specific Gravity, Urine: 1.015 (ref 1.005–1.030)
pH: 8 (ref 5.0–8.0)

## 2023-11-09 LAB — GLUCOSE, CAPILLARY
Glucose-Capillary: 151 mg/dL — ABNORMAL HIGH (ref 70–99)
Glucose-Capillary: 143 mg/dL — ABNORMAL HIGH (ref 70–99)
Glucose-Capillary: 170 mg/dL — ABNORMAL HIGH (ref 70–99)
Glucose-Capillary: 185 mg/dL — ABNORMAL HIGH (ref 70–99)

## 2023-11-09 LAB — RESP PANEL BY RT-PCR (RSV, FLU A&B, COVID)  RVPGX2
Influenza A by PCR: NEGATIVE
Influenza B by PCR: NEGATIVE
Resp Syncytial Virus by PCR: NEGATIVE
SARS Coronavirus 2 by RT PCR: NEGATIVE

## 2023-11-09 LAB — LACTIC ACID, PLASMA
Lactic Acid, Venous: 1 mmol/L (ref 0.5–1.9)
Lactic Acid, Venous: 0.6 mmol/L (ref 0.5–1.9)

## 2023-11-09 LAB — MAGNESIUM: Magnesium: 2.2 mg/dL (ref 1.7–2.4)

## 2023-11-09 MED ORDER — POTASSIUM CHLORIDE CRYS ER 20 MEQ PO TBCR
40.0000 meq | EXTENDED_RELEASE_TABLET | Freq: Once | ORAL | Status: AC
  Administered 2023-11-09 (×2): 40 meq via ORAL
  Filled 2023-11-09: qty 2

## 2023-11-09 MED ORDER — AMIODARONE HCL IN DEXTROSE 360-4.14 MG/200ML-% IV SOLN
60.0000 mg/h | INTRAVENOUS | Status: AC
  Administered 2023-11-09 (×2): 60 mg/h via INTRAVENOUS
  Filled 2023-11-09: qty 200

## 2023-11-09 MED ORDER — SODIUM CHLORIDE 0.9 % IV SOLN
2.0000 g | INTRAVENOUS | Status: DC
  Administered 2023-11-09 (×2): 2 g via INTRAVENOUS
  Filled 2023-11-09: qty 20

## 2023-11-09 MED ORDER — HALOPERIDOL LACTATE 5 MG/ML IJ SOLN
1.0000 mg | Freq: Once | INTRAMUSCULAR | Status: AC
  Administered 2023-11-09 (×2): 1 mg via INTRAVENOUS
  Filled 2023-11-09: qty 1

## 2023-11-09 MED ORDER — ACETAZOLAMIDE SODIUM 500 MG IJ SOLR
500.0000 mg | Freq: Two times a day (BID) | INTRAMUSCULAR | Status: DC
  Administered 2023-11-09 – 2023-11-11 (×8): 500 mg via INTRAVENOUS
  Filled 2023-11-09 (×4): qty 500

## 2023-11-09 MED ORDER — AMIODARONE HCL IN DEXTROSE 360-4.14 MG/200ML-% IV SOLN
30.0000 mg/h | INTRAVENOUS | Status: DC
  Administered 2023-11-09 – 2023-11-10 (×4): 30 mg/h via INTRAVENOUS
  Filled 2023-11-09 (×2): qty 200

## 2023-11-09 MED ORDER — POTASSIUM CHLORIDE CRYS ER 20 MEQ PO TBCR
40.0000 meq | EXTENDED_RELEASE_TABLET | Freq: Two times a day (BID) | ORAL | Status: DC
  Administered 2023-11-09 (×4): 40 meq via ORAL
  Filled 2023-11-09 (×3): qty 2

## 2023-11-09 NOTE — TOC Progression Note (Signed)
 Transition of Care [facility]) - Progression Note    Patient Details  Name: Maria Arroyo MRN: [id number] Date of Birth: 1950-09-12  Transition of Care [facility]) CM/SW Contact  Luise JAYSON Pan, [place] Phone Number: 11/09/2023, 3:51 PM  Clinical Narrative:  Patient is oriented x1 at this time. CSW spoke to patients spouse, Maria Arroyo, about PT recs for SNF. Maria Arroyo is agreeable to SNF for his wife. CSW to complete SNF workup at this time.   CSW will continue to follow.    Expected Discharge Plan: Skilled Nursing Facility Barriers to Discharge: Continued Medical Work up, SNF Pending bed offer, English as a second language teacher               Expected Discharge Plan and Services   Discharge Planning Services: CM Consult Post Acute Care Choice: Home Health Living arrangements for the past 2 months: Single Family Home                           HH Arranged: RN, PT [facility] Agency: [facility] Date [facility] Agency Contacted: 11/05/23 Time HH Agency Contacted: [phone] Representative spoke with at [facility] Agency: Channing Ee   Social Drivers of Health (SDOH) Interventions SDOH Screenings   Food Insecurity: No Food Insecurity (11/04/2023)  Recent Concern: Food Insecurity - Food Insecurity Present (08/11/2023)  Housing: Low Risk  (11/04/2023)  Transportation Needs: No Transportation Needs (11/04/2023)  Utilities: Not At Risk (11/04/2023)  Depression (PHQ2-9): Low Risk  (03/17/2018)  Financial Resource Strain: Low Risk  (05/12/2023)   Received from [facility]  Physical Activity: Unknown (03/10/2023)   Received from [facility]  Social Connections: Moderately Isolated (11/04/2023)  Stress: No Stress Concern Present (03/10/2023)   Received from [facility]  Tobacco Use: High Risk (11/04/2023)    Readmission Risk Interventions    07/06/2023   11:09 AM  Readmission Risk Prevention Plan  Transportation Screening Complete  HRI or Home Care Consult Complete  Social Work Consult for Recovery  Care Planning/Counseling Complete  Palliative Care Screening Not Applicable  Medication Review Oceanographer) Referral to Pharmacy

## 2023-11-09 NOTE — [place] FL2 (Signed)
 [place]  MEDICAID FL2 LEVEL OF CARE FORM     IDENTIFICATION  Patient Name: Maria Arroyo Birthdate: 28-Feb-1951 Sex: female Admission Date (Current Location): 11/04/2023  [facility] and [place] Number:  Producer, television/film/video and Address:  [facility] [place], [address] [place], [place] [address]      Provider Number: [id number]  Attending Physician Name and Address:  Arlon Carliss ORN, DO  Relative Name and Phone Number:  Mardelle, Pandolfi (Spouse)  [phone] (Mobile)    Current Level of Care: Hospital Recommended Level of Care: Skilled Nursing Facility Prior Approval Number:    Date Approved/Denied:   PASRR Number: [id number]  Discharge Plan: SNF    Current Diagnoses: Patient Active Problem List   Diagnosis Date Noted   Acute hypoxic respiratory failure (HCC) 11/05/2023   Acute HFrEF (heart failure with reduced ejection fraction) (HCC) 11/05/2023   NSTEMI (non-ST elevated myocardial infarction) (HCC) 11/05/2023   Acute on chronic diastolic heart failure (HCC) 11/04/2023   Elevated troponin 08/11/2023   Long QT interval 08/11/2023   Normocytic anemia 08/11/2023   History of lung cancer 08/11/2023   Metabolic alkalosis 07/04/2023   Tobacco use disorder 07/03/2023   Hereditary and idiopathic peripheral neuropathy 07/03/2023   Palliative care encounter 07/03/2023   Pressure injury of skin 07/03/2023   Malignant neoplasm of lung (HCC) 07/02/2023   Acute on chronic anemia 07/02/2023   CKD (chronic kidney disease) stage 4, GFR 15-29 ml/min (HCC) 07/02/2023   Chronic hypoxic respiratory failure (HCC) 07/02/2023   Iron  deficiency anemia 07/02/2023   Anticoagulated 07/02/2023   UTI (urinary tract infection) 05/15/2023   Acute on chronic respiratory failure with hypoxia and hypercapnia (HCC) 05/14/2023   COVID-19 virus infection 05/14/2023   Controlled type 2 diabetes mellitus without complication, without long-term current use of insulin   (HCC) 05/14/2023   Acute kidney injury superimposed on chronic kidney disease (HCC) 05/14/2023   Hypokalemia 05/14/2023   Hypophosphatemia 05/14/2023   COPD (chronic obstructive pulmonary disease) (HCC) 05/13/2023   COPD with acute exacerbation (HCC) 05/12/2023   Branch retinal vein occlusion with macular edema of left eye 12/31/2021   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   Cystoid macular edema of right eye 12/10/2021   Cystoid macular edema, left eye 12/10/2021   Branch retinal vein occlusion of right eye with macular edema 12/10/2021   Posterior vitreous detachment of both eyes 12/10/2021   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 11/07/2018   Right ventricular failure (HCC) 08/31/2018   Symptomatic anemia 08/26/2018   Atrial fibrillation (HCC) 08/26/2018   Recurrent right pleural effusion 08/26/2018   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) 04/28/2018   S/P lobectomy of lung 04/04/2018   Solitary pulmonary nodule 02/04/2018   Neuropathy 03/18/2017   Disturbance of skin sensation 03/10/2013   Cervical spondylosis with myelopathy 03/10/2013   Abnormality of gait 03/10/2013   Blood glucose elevated 11/18/2012   Arthritis 02/02/2011   Chronic pain 02/02/2011   Fibrositis 02/02/2011   Coronary artery disease    COPD GOLD II if use FEV1/VC ratio     Essential hypertension    Hyperlipidemia    Cigarette smoker    Anxiety and depression     Orientation RESPIRATION BLADDER Height & Weight     Self  O2 (4L [place]) Incontinent, External catheter Weight: 141 lb 5 oz (64.1 kg) Height:  4' 11 (149.9 cm)  BEHAVIORAL SYMPTOMS/MOOD NEUROLOGICAL BOWEL NUTRITION STATUS      Continent Diet (  see discharge summary)  AMBULATORY STATUS COMMUNICATION OF NEEDS Skin   Extensive Assist Verbally Normal                       Personal Care Assistance Level of Assistance  Bathing, Feeding, Dressing Bathing Assistance: Maximum assistance Feeding assistance:  Limited assistance Dressing Assistance: Maximum assistance     Functional Limitations Info  Sight, Speech, Hearing Sight Info: Adequate Hearing Info: Adequate Speech Info: Adequate    SPECIAL CARE FACTORS FREQUENCY  PT (By licensed PT), OT (By licensed OT)     PT Frequency: 5x week OT Frequency: 5x week            Contractures Contractures Info: Not present    Additional Factors Info  Code Status, Allergies, Insulin  Sliding Scale, Psychotropic Code Status Info: Full Allergies Info: Ace Inhibitors , Codeine Psychotropic Info: BUSPAR  Insulin  Sliding Scale Info: See discharge summary       Current Medications (11/09/2023):  This is the current hospital active medication list Current Facility-Administered Medications  Medication Dose Route Frequency Provider Last Rate Last Admin   acetaminophen  (TYLENOL ) tablet 650 mg  650 mg Oral Q6H PRN Howerter, Justin B, DO   650 mg at 11/04/23 0844   Or   acetaminophen  (TYLENOL ) suppository 650 mg  650 mg Rectal Q6H PRN Howerter, Justin B, DO       acetaZOLAMIDE  (DIAMOX ) injection 500 mg  500 mg Intravenous BID Calkins, Derek W, DO   500 mg at 11/09/23 9264   albuterol  (PROVENTIL ) (2.5 MG/3ML) 0.083% nebulizer solution 2.5 mg  2.5 mg Nebulization Q2H PRN Smith, Rondell A, MD       amiodarone  (NEXTERONE  PREMIX) 360-4.14 MG/200ML-% (1.8 mg/mL) IV infusion  60 mg/hr Intravenous Continuous Lavona Agent, MD 33.3 mL/hr at 11/09/23 1500 60 mg/hr at 11/09/23 1500   amiodarone  (NEXTERONE  PREMIX) 360-4.14 MG/200ML-% (1.8 mg/mL) IV infusion  30 mg/hr Intravenous Continuous Lavona Agent, MD 16.67 mL/hr at 11/09/23 1606 30 mg/hr at 11/09/23 1606   apixaban  (ELIQUIS ) tablet 5 mg  5 mg Oral BID Lavona Agent, MD   5 mg at 11/09/23 1049   bisacodyl  (DULCOLAX) EC tablet 5 mg  5 mg Oral Daily PRN Arlon Carliss ORN, DO   5 mg at 11/07/23 1121   bisacodyl  (DULCOLAX) suppository 10 mg  10 mg Rectal Daily PRN Arlon Carliss ORN, DO   10 mg at 11/07/23 1803    busPIRone  (BUSPAR ) tablet 15 mg  15 mg Oral BID Smith, Rondell A, MD   15 mg at 11/09/23 1048   cefTRIAXone  (ROCEPHIN ) 2 g in sodium chloride  0.9 % 100 mL IVPB  2 g Intravenous Q24H Arlon Carliss W, DO   Stopped at 11/09/23 1117   Chlorhexidine  Gluconate Cloth 2 % PADS 6 each  6 each Topical Daily Arlon Carliss ORN, DO   6 each at 11/09/23 1049   ferrous sulfate  tablet 325 mg  325 mg Oral Daily Smith, Rondell A, MD   325 mg at 11/09/23 1048   hydrALAZINE  (APRESOLINE ) tablet 25 mg  25 mg Oral TID Smith, Rondell A, MD   25 mg at 11/09/23 1539   insulin  aspart (novoLOG ) injection 0-9 Units  0-9 Units Subcutaneous TID WC Claudene Reeves A, MD   1 Units at 11/09/23 1159   isosorbide  dinitrate (ISORDIL ) tablet 10 mg  10 mg Oral TID Smith, Rondell A, MD   10 mg at 11/09/23 1539   lidocaine  (LIDODERM ) 5 % 1 patch  1 patch  Transdermal Q24H Claudene Reeves A, MD   1 patch at 11/09/23 1049   melatonin tablet 3 mg  3 mg Oral QHS PRN Howerter, Justin B, DO       nicotine  (NICODERM CQ  - dosed in mg/24 hours) patch 21 mg  21 mg Transdermal Daily Claudene, Rondell A, MD   21 mg at 11/09/23 1049   Oral care mouth rinse  15 mL Mouth Rinse PRN Claudene Reeves A, MD       polyethylene glycol (MIRALAX  / GLYCOLAX ) packet 17 g  17 g Oral Daily Arlon Carliss ORN, DO   17 g at 11/08/23 9150   potassium chloride  SA (KLOR-CON  M) CR tablet 40 mEq  40 mEq Oral BID Arlon Carliss ORN, DO   40 mEq at 11/09/23 1048   rosuvastatin  (CRESTOR ) tablet 20 mg  20 mg Oral Daily Lee, [place], NP   20 mg at 11/09/23 1048   sodium chloride  flush (NS) 0.9 % injection 10-40 mL  10-40 mL Intracatheter Q12H Arlon Carliss W, DO   10 mL at 11/09/23 1050   sodium chloride  flush (NS) 0.9 % injection 10-40 mL  10-40 mL Intracatheter PRN Arlon Carliss ORN, DO       sodium chloride  flush (NS) 0.9 % injection 3 mL  3 mL Intravenous Q12H Smith, Rondell A, MD   3 mL at 11/09/23 1050   venlafaxine  XR (EFFEXOR -XR) 24 hr capsule 150 mg  150 mg Oral Daily Smith,  Rondell A, MD   150 mg at 11/09/23 1048     Discharge Medications: Please see discharge summary for a list of discharge medications.  Relevant Imaging Results:  Relevant Lab Results:   Additional Information [id number] [id number]  Luise JAYSON Pan, LCSWA

## 2023-11-09 NOTE — Progress Notes (Signed)
 Progress Note   Patient: Maria Arroyo FMW:[id number] DOB: 1950/07/05 DOA: 11/04/2023  DOS: the patient was seen and examined on 11/09/2023   Brief hospital course:  73 y.o. 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    Assessment and Plan:   Acute metabolic encephalopathy - Seems more lethargic again this morning despite improvement in alkalemia.  Concern for possible underlying brewing infection given worsening leukocytosis.  Tmax 100.1 yesterday.  Ordered blood cultures, UA, chest x-ray, respiratory panel.  Chest x-ray personally reviewed noting slightly improved congestion with right effusion.  Will order chest CT.  Lactate negative making overt sepsis less likely.  UA pending.  Discontinue any sedative type medications including hydrocodone .  Placing on empiric ceftriaxone  2 g every 24 hours.  Will monitor encephalopathy closely.  Acute hypoxic and hypercapnic respiratory failure - Currently requiring 3L nasal cannula (pt's baseline).  ABG 8/11 7.55/71/71.  Metabolic alkalosis along with hypercapnia (some chronic some compensatory).  Alkalemia improving.  O2 saturation goal 88-92.  Will order CPAP for night.   Acute exacerbation of HFrEF - Echo noting newly reduced EF 30-35%, biventricular reduced EF, severely dilated BAE.  Etiology likely worsening underlying CAD.  Cardiology/heart failure closely.  Showing markedly improved response to increase of diuretics yesterday, diuresing greater than 10.6 L total since presentation.  Starting to appear intravascularly dry.  IV Lasix   on hold.  Milrinone  discontinued.  Monitor urine output closely.  Recheck BMP and magnesium  in AM.   NSTEMI with underlying CAD - Troponin elevation up to 1139 downtrending this morning..  Cardiology following closely.  Likely ongoing demand ischemia in the setting of worsening CHF exacerbation.  Underlying CAD with previous LAD stenting and probable stenosis of LAD.  Currently not pursuing interventional cardiac catheterization this time due to kidney dysfunction.   Acute kidney injury on CKD 4 - Showing mild improvement from yesterday.  Continue to monitor urine output.  Recheck BMP and magnesium  in AM.   Mixed acid-base disorder - ABG 8/11 showing 7.55/71/71.  Likely mixed in the setting of chronic hypercapnic respiratory failure and contraction alkalosis related with diuresis.  Bicarb showing improvement this morning, going from 44 down to 39.  Continue NS at 50 mL/h.  Continue Diamox  500 mg IV twice daily.  Recheck BMP in AM.   Hypokalemia - Exacerbated by diuresis.  Aggressive replenishment on board.  Will recheck BMP in AM.   Transaminitis - Likely from congestion of HFrEF.  LFTs showing improvement this morning.  Will recheck in AM.   Diabetes mellitus - Insulin  sliding scale on board.   Iron  deficiency anemia - No overt bleeding.  No active bleeding appreciated.  Will transfuse if hemoglobin less than 8 secondary to cardiac history above.  Will recheck hemoglobin in AM.  Appears stable at this time.   Paroxysmal atrial fibrillation - Amiodarone  on board.  Eliquis  on board.   History of squamous cell carcinoma of the right lung - S/p right lobectomy in 2020.   Tobacco abuse - Still smokes.  Encouraging smoking cessation.  Nicotine  patch offered.   Obesity class I - BMI nearly 32.  Encouraged lifestyle modification and weight loss.   Subjective: Continues to appear lethargic/obtunded.  Markedly different from 2 days ago.  Despite improvement in kidney function, alkalosis,  oxygenation, continues to be lethargic.  Patient's daughter at the bedside corroborating that her mental status waxes and wanes..  Slightly more alert later in the day yesterday than right now.  Physical Exam:  Vitals:   11/09/23 1048 11/09/23 1100 11/09/23 1130 11/09/23 1200  BP: 120/72  114/73   Pulse:   91   Resp:  (!) 23 17 17   Temp:   98 F (36.7 C)   TempSrc:   Oral   SpO2:   98%   Weight:      Height:        GENERAL:  lethargic, ill-appearing HEENT:  EOMI, nasal cannula CARDIOVASCULAR: Irregularly irregular, systolic murmur appreciated RESPIRATORY: Poor air movement GASTROINTESTINAL:  Soft, nontender, nondistended EXTREMITIES: Trace BL LE pitting edema NEURO: Encephalopathic, no new focal deficits appreciated SKIN:  No rashes noted PSYCH:  Appropriate mood and affect    Data Reviewed:  Imaging Studies: DG CHEST PORT 1 VIEW Result Date: 11/09/2023 CLINICAL DATA:  Altered mental status. EXAM: PORTABLE CHEST 1 VIEW COMPARISON:  November 04, 2023. FINDINGS: Stable cardiomegaly. Mild central pulmonary vascular congestion is noted. Possible mild bilateral pulmonary edema is noted. Bilateral pleural effusions are noted, right greater than left. Bony thorax is unremarkable. IMPRESSION: Stable cardiomegaly with mild central pulmonary vascular congestion and possible mild bilateral pulmonary edema. Pleural effusions as noted above. Electronically Signed   By: Lynwood Landy Raddle M.D.   On: 11/09/2023 11:22   US  EKG SITE RITE Result Date: 11/05/2023 If Site Rite image not attached, placement could not be confirmed due to current cardiac rhythm.  ECHOCARDIOGRAM COMPLETE Result Date: 11/04/2023    ECHOCARDIOGRAM REPORT   Patient Name:   Maria Arroyo Date of Exam: 11/04/2023 Medical Rec #:  [id number]                  Height:       59.0 in Accession #:    [id number]                 Weight:       155.4 lb Date of Birth:  1951/02/04                  BSA:          1.657 m Patient Age:     72 years                   BP:           132/80 mmHg Patient Gender: F                          HR:           70 bpm. Exam Location:  Inpatient Procedure: 2D Echo, Color Doppler, Cardiac Doppler and Intracardiac            Opacification Agent (Both Spectral and Color Flow Doppler were            utilized during procedure). Indications:    I50.31 Acute diastolic (congestive) heart failure  History:        Patient has prior history of Echocardiogram examinations, most                 recent 08/27/2018. CHF, CAD, COPD; Risk Factors:Hypertension and                 Dyslipidemia.  Sonographer:    Damien Senior RDCS Referring Phys: [id number] EVA KATHEE PORE IMPRESSIONS  1. Left ventricular ejection fraction, by estimation, is 30 to 35%. The left ventricle has moderately decreased function. Left ventricular endocardial border not optimally defined to evaluate regional wall motion. The left ventricular internal cavity size was mildly dilated. Left ventricular diastolic parameters are consistent with Grade II diastolic dysfunction (pseudonormalization). Elevated left atrial pressure.  2. Right ventricular systolic function is moderately reduced. The right ventricular size is moderately enlarged.  3. Left atrial size was severely dilated.  4. Right atrial size was severely dilated.  5. The mitral valve is degenerative. Mild to moderate mitral valve regurgitation. No evidence of mitral stenosis. Moderate mitral annular calcification.  6. Tricuspid valve regurgitation is moderate.  7. The aortic valve is calcified. There is moderate calcification of the aortic valve. There is moderate thickening of the aortic valve. Aortic valve regurgitation is not visualized. Aortic valve sclerosis/calcification is present, without any evidence of aortic stenosis.  8. The inferior vena cava is normal in size with greater than 50% respiratory variability, suggesting right atrial pressure of 3 mmHg.  9. Very poor acoustical windows limit ability  to detect focal wall motion abnormalities. The estimated EF may be underestimated due to poor images even with definity  contrast. FINDINGS  Left Ventricle: Left ventricular ejection fraction, by estimation, is 30 to 35%. The left ventricle has moderately decreased function. Left ventricular endocardial border not optimally defined to evaluate regional wall motion. Definity  contrast agent was given IV to delineate the left ventricular endocardial borders. The left ventricular internal cavity size was mildly dilated. There is no left ventricular hypertrophy. Abnormal (paradoxical) septal motion, consistent with left bundle branch block. Left ventricular diastolic parameters are consistent with Grade II diastolic dysfunction (pseudonormalization). Elevated left atrial pressure. Right Ventricle: The right ventricular size is moderately enlarged. No increase in right ventricular wall thickness. Right ventricular systolic function is moderately reduced. Left Atrium: Left atrial size was severely dilated. Right Atrium: Right atrial size was severely dilated. Pericardium: There is no evidence of pericardial effusion. Mitral Valve: The mitral valve is degenerative in appearance. There is mild thickening of the mitral valve leaflet(s). There is mild calcification of the mitral valve leaflet(s). Moderate mitral annular calcification. Mild to moderate mitral valve regurgitation. No evidence of mitral valve stenosis. Tricuspid Valve: The tricuspid valve is normal in structure. Tricuspid valve regurgitation is moderate . No evidence of tricuspid stenosis. Aortic Valve: The aortic valve is calcified. There is moderate calcification of the aortic valve. There is moderate thickening of the aortic valve. Aortic valve regurgitation is not visualized. Aortic valve sclerosis/calcification is present, without any  evidence of aortic stenosis. Pulmonic Valve: The pulmonic valve was normal in structure. Pulmonic valve regurgitation is  trivial. No evidence of pulmonic stenosis. Aorta: The aortic root is normal in size and structure. Venous: The inferior vena cava is normal in size with greater than 50% respiratory variability, suggesting right atrial pressure of 3 mmHg. IAS/Shunts: No atrial level shunt detected by color flow Doppler.  LEFT VENTRICLE PLAX 2D LVIDd:         5.80 cm      Diastology LVIDs:         5.10 cm      LV e' medial:    3.48 cm/s LV PW:         1.00 cm      LV E/e' medial:  17.4 LV IVS:  0.90 cm      LV e' lateral:   5.11 cm/s LVOT diam:     2.10 cm      LV E/e' lateral: 11.8 LV SV:         40 LV SV Index:   24 LVOT Area:     3.46 cm  LV Volumes (MOD) LV vol d, MOD A2C: 196.0 ml LV vol d, MOD A4C: 202.0 ml LV vol s, MOD A2C: 121.0 ml LV vol s, MOD A4C: 147.0 ml LV SV MOD A2C:     75.0 ml LV SV MOD A4C:     202.0 ml LV SV MOD BP:      67.3 ml RIGHT VENTRICLE RV S prime:     11.00 cm/s TAPSE (M-mode): 2.3 cm LEFT ATRIUM              Index        RIGHT ATRIUM           Index LA diam:        5.50 cm  3.32 cm/m   RA Area:     40.80 cm LA Vol (A2C):   103.0 ml 62.16 ml/m  RA Volume:   184.00 ml 111.05 ml/m LA Vol (A4C):   96.0 ml  57.94 ml/m LA Biplane Vol: 99.9 ml  60.29 ml/m  AORTIC VALVE LVOT Vmax:   85.60 cm/s LVOT Vmean:  53.000 cm/s LVOT VTI:    0.115 m  AORTA Ao Root diam: 3.10 cm Ao Asc diam:  3.00 cm MITRAL VALVE               TRICUSPID VALVE MV Area (PHT): 2.76 cm    TR Peak grad:   26.4 mmHg MV Decel Time: 275 msec    TR Vmax:        257.00 cm/s MV E velocity: 60.40 cm/s MV A velocity: 73.30 cm/s  SHUNTS MV E/A ratio:  0.82        Systemic VTI:  0.12 m                            Systemic Diam: 2.10 cm Wilbert Bihari MD Electronically signed by Wilbert Bihari MD Signature Date/Time: 11/04/2023/5:34:51 PM    Final    DG Ribs Unilateral Right Result Date: 11/04/2023 CLINICAL DATA:  Fall with persistent right rib pain. EXAM: RIGHT RIBS - 2 VIEW COMPARISON:  Chest radiograph earlier today, chest CT 03/03/2022  FINDINGS: There are minimally displaced fractures of posterolateral sixth and seventh ribs, as well as anterior tenth rib. Fracture of anterior right ninth rib has surrounding callus formation and is likely subacute or chronic. Right pleural effusion is again seen, similar to radiograph earlier today. No pneumothorax. IMPRESSION: 1. Minimally displaced fractures of posterolateral right sixth and seventh ribs, as well as anterior tenth rib. 2. Fracture of anterior right ninth rib has surrounding callus formation and is likely subacute or chronic. 3. Right pleural effusion, similar to radiograph earlier today. Electronically Signed   By: Andrea Gasman M.D.   On: 11/04/2023 14:16   DG Chest Port 1 View Result Date: 11/04/2023 CLINICAL DATA:  Shortness of breath. EXAM: PORTABLE CHEST 1 VIEW COMPARISON:  Portable chest 08/11/2023 FINDINGS: 4:51 a.m. The heart is enlarged. There is increased central vascular prominence, increased mild diffuse interstitial edema. There is an increased now moderate right pleural effusion a small left pleural effusion. There is overlying atelectasis or consolidation in the right  lower lung field. Patchy perihilar hazy opacity is also seen and probably due to ground-glass edema. The aorta is tortuous and calcified, with stable mediastinum. Partially visible fusion plating lower cervical spine is again shown. No new osseous abnormality. There is degenerative change of the thoracic spine. IMPRESSION: 1. Cardiomegaly with increased central vascular prominence and mild diffuse interstitial edema. 2. Increased now moderate right pleural effusion and small left pleural effusion. 3. Overlying atelectasis or consolidation in the right lower lung field. 4. Patchy perihilar hazy opacity is also seen and probably due to ground-glass edema. Electronically Signed   By: Francis Quam M.D.   On: 11/04/2023 05:07    Results are pending, will review when available.  Previous records (including but  not limited to H&P, progress notes, nursing notes, TOC management) were reviewed in assessment of this patient.  Labs: CBC: Recent Labs  Lab 11/05/23 0233 11/06/23 0543 11/07/23 0535 11/08/23 0556 11/09/23 0211  WBC 8.0 8.8 9.0 14.1* 19.6*  HGB 8.6* 8.3* 8.9* 9.6* 8.9*  HCT 29.2* 27.2* 29.4* 31.5* 29.7*  MCV 87.7 84.7 85.7 84.5 85.8  PLT 250 253 264 290 276   Basic Metabolic Panel: Recent Labs  Lab 11/04/23 0439 11/04/23 0450 11/06/23 0543 11/06/23 1534 11/07/23 0535 11/08/23 0556 11/09/23 0211  NA 135   < > 134* 134* 134* 135 135  K 5.0   < > 2.7* 3.9 3.7 3.3* 3.1*  CL 88*   < > 80* 77* 79* 76* 82*  CO2 33*   < > 41* 43* 43* 44* 39*  GLUCOSE 169*   < > 147* 188* 122* 157* 149*  BUN 53*   < > 59* 61* 54* 51* 50*  CREATININE 2.26*   < > 2.18* 2.06* 2.18* 2.08* 1.91*  CALCIUM  9.0   < > 8.8* 8.7* 8.7* 9.0 9.0  MG 2.7*  --  2.3  --  2.0 2.0 2.2   < > = values in this interval not displayed.   Liver Function Tests: Recent Labs  Lab 11/04/23 1412 11/06/23 0543 11/07/23 0535 11/08/23 0556 11/09/23 0211  AST 241* 108* 59* 37 35  ALT 309* 236* 167* 112* 78*  ALKPHOS 159* 172* 151* 124 110  BILITOT 0.7 0.5 0.8 0.4 0.6  PROT 6.4* 6.0* 5.9* 6.3* 6.0*  ALBUMIN 2.7* 2.6* 2.5* 2.4* 2.3*   CBG: Recent Labs  Lab 11/08/23 1107 11/08/23 1545 11/08/23 2047 11/09/23 0620 11/09/23 1130  GLUCAP 211* 156* 193* 151* 143*    Scheduled Meds:  acetaZOLAMIDE   500 mg Intravenous BID   apixaban   5 mg Oral BID   busPIRone   15 mg Oral BID   Chlorhexidine  Gluconate Cloth  6 each Topical Daily   ferrous sulfate   325 mg Oral Daily   hydrALAZINE   25 mg Oral TID   insulin  aspart  0-9 Units Subcutaneous TID WC   isosorbide  dinitrate  10 mg Oral TID   lidocaine   1 patch Transdermal Q24H   nicotine   21 mg Transdermal Daily   polyethylene glycol  17 g Oral Daily   potassium chloride   40 mEq Oral BID   rosuvastatin   20 mg Oral Daily   sodium chloride  flush  10-40 mL Intracatheter  Q12H   sodium chloride  flush  3 mL Intravenous Q12H   venlafaxine  XR  150 mg Oral Daily   Continuous Infusions:  amiodarone  60 mg/hr (11/09/23 1200)   amiodarone      cefTRIAXone  (ROCEPHIN )  IV Stopped (11/09/23 1117)   PRN Meds:.acetaminophen  **OR** acetaminophen ,  albuterol , bisacodyl , bisacodyl , HYDROcodone -acetaminophen , melatonin, mouth rinse, sodium chloride  flush  Family Communication: Daughter at bedside  Disposition: Status is: Inpatient Remains inpatient appropriate because: Acute metabolic encephalopathy, CHF exacerbation     Time spent: 55 minutes  Length of inpatient stay: 5 days  Author: Carliss LELON Canales, DO 11/09/2023 1:11 PM  For on call review [email or web address].

## 2023-11-09 NOTE — Evaluation (Signed)
 Physical Therapy Evaluation Patient Details Name: Maria Arroyo MRN: [id number] DOB: 05-02-1950 Today's Date: 11/09/2023  History of Present Illness  Patient is a 73 yo female presenting to the ED with SOB on 11/04/23. Admitted with acute exacerbation of HFrEF and also noted NSTEMI with CAD and elevated troponin. Metabolic aklalosis, hypokalemia and transaminitis also noted. 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   Clinical Impression  Pt in bed upon arrival of PT, opening eyes but not responding verbally to questions or following commands. The pt did intermittently yell out my back but as never able to answer regarding location, severity, or quality of the pain. Pt also not following commands for extremities or answering other questions. She required totalA for all bed mobility, and maxA to complete partial sit-stand. Pt with VSS on 3.5L O2, but no change in responsiveness with change in position this session. Would be dependent on hoyer lift, WC, and likely hospital bed at this time, recommend continued skilled PT and post-acute rehab <3hours/day.       If plan is discharge home, recommend the following: Two people to help with walking and/or transfers;Two people to help with bathing/dressing/bathroom;Assistance with cooking/housework;Assistance with feeding;Direct supervision/assist for medications management;Direct supervision/assist for financial management;Assist for transportation;Help with stairs or ramp for entrance;Supervision due to cognitive status   Can travel by private vehicle   No    Equipment Recommendations Wheelchair (measurements PT);Wheelchair cushion (measurements PT);Hoyer lift  Recommendations for [employer]       Functional  Status Assessment Patient has had a recent decline in their functional status and demonstrates the ability to make significant improvements in function in a reasonable and predictable amount of time.     Precautions / Restrictions Precautions Precautions: Fall Recall of Precautions/Restrictions: Impaired Precaution/Restrictions Comments: watch HR and O2 Restrictions Weight Bearing Restrictions Per Provider Order: No      Mobility  Bed Mobility Overal bed mobility: Needs Assistance Bed Mobility: Supine to Sit, Sit to Supine, Rolling Rolling: Total assist, +2 for physical assistance   Supine to sit: Total assist, +2 for physical assistance Sit to supine: Total assist, +2 for physical assistance   General bed mobility comments: pt without attempt to assist for any movement.    Transfers Overall transfer level: Needs assistance Equipment used: 1 person hand held assist Transfers: Sit to/from Stand Sit to Stand: Max assist           General transfer comment: maxA to rock and power up to standing, poor hip extension and limited ability to clear hips without maxA at trunk and hips. unable to extend at trunk or take steps    Ambulation/Gait               General Gait Details: deferred due to inability to stand upright despite maxA     Balance Overall balance assessment: Needs assistance Sitting-balance support: Bilateral upper extremity supported, Feet supported Sitting balance-Leahy Scale: Fair Sitting balance - Comments: can static sit with UE support   Standing balance support: No upper extremity supported, During functional activity Standing balance-Leahy Scale: Zero Standing balance comment: dependent on external support                             Pertinent Vitals/Pain Pain Assessment Pain  Assessment: Faces Faces Pain Scale: Hurts whole lot Pain Location: back Pain Descriptors / Indicators: Grimacing, Guarding, Discomfort, Moaning Pain  Intervention(s): Limited activity within patient's tolerance, Monitored during session, Repositioned    Home Living Family/patient expects to be discharged to:: Private residence Living Arrangements: Spouse/significant other Available Help at Discharge: Family;Available PRN/intermittently (Husband works, niece checks on her during the day) Type of Home: House Home Access: Stairs to enter Entrance Stairs-Rails: Right Entrance Stairs-Number of Steps: 3 in front, 5 in back   Home Layout: One level Home Equipment: Cane - single point;Grab bars - tub/shower;Shower seat;BSC/3in1;Rollator (4 wheels);Hand held shower head Additional Comments: info per chart, no family present and pt not responsive    Prior Function Prior Level of Function : Independent/Modified Independent             Mobility Comments: Uses rollator, denies falls ADLs Comments: niece assists with iADLs and ADLs as needed     Extremity/Trunk Assessment   Upper Extremity Assessment Upper Extremity Assessment: Defer to OT evaluation    Lower Extremity Assessment Lower Extremity Assessment: Difficult to assess due to impaired cognition;Generalized weakness (pt with no active command following for MMT, does respond to touch so suspect sensation intact, unable to power up to standing)    Cervical / Trunk Assessment Cervical / Trunk Assessment: Kyphotic (minimally)  Communication   Communication Communication: Impaired Factors Affecting Communication: Reduced clarity of speech;Difficulty expressing self    Cognition Arousal: Lethargic Behavior During Therapy: Flat affect   PT - Cognitive impairments: Difficult to assess, Sequencing, Problem solving, Safety/Judgement, Attention, Initiation Difficult to assess due to: Level of arousal                     PT - Cognition Comments: pt with limited responses, yeslling out my back throughout session but unable to answer regarding location, severity, or quality  of the pain. Pt not following commands for extremities or answering other questions. pt assisting with movement once facilitated by PT only. Following commands: Impaired Following commands impaired:  (no command following in session)     Cueing Cueing Techniques: Verbal cues, Tactile cues, Visual cues, Gestural cues     General Comments General comments (skin integrity, edema, etc.): HR to 120s, BP stable, SpO2 94% on 3.5 L    Exercises     Assessment/Plan    PT Assessment Patient needs continued PT services  PT Problem List Decreased strength;Decreased range of motion;Decreased activity tolerance;Decreased balance;Decreased mobility;Decreased cognition;Decreased safety awareness;Cardiopulmonary status limiting activity       PT Treatment Interventions DME instruction;Gait training;Stair training;Functional mobility training;Therapeutic activities;Therapeutic exercise;Balance training;Patient/family education    PT Goals (Current goals can be found in the Care Plan section)  Acute Rehab PT Goals Patient Stated Goal: none stated PT Goal Formulation: Patient unable to participate in goal setting Time For Goal Achievement: 11/23/23 Potential to Achieve Goals: Fair    Frequency Min 2X/week        AM-PAC PT 6 Clicks Mobility  Outcome Measure Help needed turning from your back to your side while in a flat bed without using bedrails?: Total Help needed moving from lying on your back to sitting on the side of a flat bed without using bedrails?: Total Help needed moving to and from a bed to a chair (including a wheelchair)?: Total Help needed standing up from a chair using your arms (e.g., wheelchair or bedside chair)?: Total Help needed to walk in hospital room?: Total Help needed climbing 3-5 steps with  a railing? : Total 6 Click Score: 6    End of Session Equipment Utilized During Treatment: Gait belt;Oxygen Activity Tolerance: Patient limited by lethargy Patient left: in  bed;with call bell/phone within reach;with bed alarm set Nurse Communication: Mobility status PT Visit Diagnosis: Unsteadiness on feet (R26.81);Other abnormalities of gait and mobility (R26.89);Muscle weakness (generalized) (M62.81)    Time: 8751-8691 PT Time Calculation (min) (ACUTE ONLY): 20 min   Charges:   PT Evaluation $PT Eval Moderate Complexity: 1 Mod   PT General Charges $$ ACUTE PT VISIT: 1 Visit         Izetta Call, PT, DPT   Acute Rehabilitation Department Office [phone] Secure Chat Communication Preferred  Izetta JULIANNA Call 11/09/2023, 2:20 PM

## 2023-11-09 NOTE — Progress Notes (Addendum)
 Progress Note  Patient Name: Maria Arroyo Date of Encounter: 11/09/2023  Primary Cardiologist:   Peter [place], MD   Subjective   More somnolent today.  Wakes and can cooperate with exam but more confused.    Inpatient Medications    Scheduled Meds:  acetaZOLAMIDE   500 mg Intravenous BID   amiodarone   200 mg Oral Daily   apixaban   5 mg Oral BID   busPIRone   15 mg Oral BID   Chlorhexidine  Gluconate Cloth  6 each Topical Daily   ferrous sulfate   325 mg Oral Daily   hydrALAZINE   25 mg Oral TID   insulin  aspart  0-9 Units Subcutaneous TID WC   isosorbide  dinitrate  10 mg Oral TID   lidocaine   1 patch Transdermal Q24H   nicotine   21 mg Transdermal Daily   polyethylene glycol  17 g Oral Daily   potassium chloride   40 mEq Oral BID   rosuvastatin   20 mg Oral Daily   sodium chloride  flush  10-40 mL Intracatheter Q12H   sodium chloride  flush  3 mL Intravenous Q12H   venlafaxine  XR  150 mg Oral Daily   Continuous Infusions:  sodium chloride  50 mL/hr at 11/09/23 0900   milrinone  0.125 mcg/kg/min (11/09/23 0900)   PRN Meds: acetaminophen  **OR** acetaminophen , albuterol , bisacodyl , bisacodyl , HYDROcodone -acetaminophen , melatonin, mouth rinse, sodium chloride  flush   Vital Signs    Vitals:   11/09/23 0433 11/09/23 0734 11/09/23 0800 11/09/23 0900  BP: (!) 117/54 114/83    Pulse:  (!) 110    Resp: 18 20 17 15   Temp: 99.2 F (37.3 C)     TempSrc: Axillary     SpO2: 95% 96% 96%   Weight: 64.1 kg     Height:        Intake/Output Summary (Last 24 hours) at 11/09/2023 0908 Last data filed at 11/09/2023 0900 Gross per 24 hour  Intake 1198.71 ml  Output 1350 ml  Net -151.29 ml   Filed Weights   11/07/23 0446 11/08/23 0639 11/09/23 0433  Weight: 67.7 kg 62.6 kg 64.1 kg    Telemetry    NSR - Personally Reviewed  ECG    NA - Personally Reviewed  Physical Exam   GEN:    Chronically and acutely ill appearing Neck: No  JVD Cardiac: Irregular, no  murmurs, rubs, or gallops.  Respiratory:      Decreased breath sounds with coarse crackles.  GI: Soft, nontender, non-distended, normal bowel sounds  MS:  No edema; No deformity. Neuro:   Nonfocal  Psych:   Confused and somnolent.    Labs    Chemistry Recent Labs  Lab 11/07/23 0535 11/08/23 0556 11/09/23 0211  NA 134* 135 135  K 3.7 3.3* 3.1*  CL 79* 76* 82*  CO2 43* 44* 39*  GLUCOSE 122* 157* 149*  BUN 54* 51* 50*  CREATININE 2.18* 2.08* 1.91*  CALCIUM  8.7* 9.0 9.0  PROT 5.9* 6.3* 6.0*  ALBUMIN 2.5* 2.4* 2.3*  AST 59* 37 35  ALT 167* 112* 78*  ALKPHOS 151* 124 110  BILITOT 0.8 0.4 0.6  GFRNONAA 23* 25* 28*  ANIONGAP 12 15 14      Hematology Recent Labs  Lab 11/07/23 0535 11/08/23 0556 11/09/23 0211  WBC 9.0 14.1* 19.6*  RBC 3.43* 3.73* 3.46*  HGB 8.9* 9.6* 8.9*  HCT 29.4* 31.5* 29.7*  MCV 85.7 84.5 85.8  MCH 25.9* 25.7* 25.7*  MCHC 30.3 30.5 30.0  RDW 17.5* 17.6* 17.9*  PLT 264  290 276    Cardiac EnzymesNo results for input(s): TROPONINI in the last 168 hours. No results for input(s): TROPIPOC in the last 168 hours.   BNP Recent Labs  Lab 11/04/23 0439  BNP 1,970.1*     DDimer No results for input(s): DDIMER in the last 168 hours.   Radiology    No results found.   Cardiac Studies   Echo See above.    Patient Profile     73 y.o. female with CAD, HTN, HLD, PAF, chronic diastolic heart failure, chronic respiratory failure, DM II, Squamous cell carcinoma s/p R lobectomy 20', tobacco abuse and depression. AHF team to see for acute combined systolic and diastolic heart failure and suspected low output HF.   Assessment & Plan    Acute combined systolic and diastolic heart failure:   Possible ischemic etiology.  Note the EF is 30 - 35% as above.  EF had appeared to be 55% in 2020.  Holding on cath with increased creatinine.  On milrinone .   Dose reduced yesterday.  Net negative 11.8 liters since admission despite stopping diuretic yesterday.  .  Liver enzymes continue to trend down.    CVP 12,   CoOx 78.4.   Holding on IV diuresis but need to see again this PM and assess volume status.    Hypokalemia:   Potassium 3.1.   Will give additional Kdur for 120 meq today.   .   CAD:    Medical management.    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     Paroxysmal A-fib:     Back in atrial fib.  I will stop the PO amio and start IV to try to establish and maintain NSR while acutely ill.    COPD/history of lobectomy for lung cancer  DM/HL :  Continue current therapy.    Confusion:    Worse despite lower CO2 and other improvements as above.  Reduced hydrocodone  yesterday.  She did receive a dose yesterday morning and again this morning.  Will stop.  Reviewed with primary team at bedside.  Also with leukocytosis.  Will look for acute infection with CXR and UA.   Anemia:   Hemoglobin stable.  Iron  studies OK on PO iron  supplement.  No change in therapy.   Leukocytosis: Antibiotics per primary team and evaluation as above.    For questions or updates, please contact [facility] Please consult [email or web address] for contact info under Cardiology/STEMI.   Signed, Lynwood Schilling, MD  11/09/2023, 9:08 AM

## 2023-11-10 DIAGNOSIS — B9562 Methicillin resistant Staphylococcus aureus infection as the cause of diseases classified elsewhere: Secondary | ICD-10-CM

## 2023-11-10 DIAGNOSIS — R7881 Bacteremia: Secondary | ICD-10-CM

## 2023-11-10 DIAGNOSIS — I5021 Acute systolic (congestive) heart failure: Secondary | ICD-10-CM | POA: Diagnosis not present

## 2023-11-10 LAB — COMPREHENSIVE METABOLIC PANEL WITH GFR
ALT: 137 U/L — ABNORMAL HIGH (ref 0–44)
AST: 161 U/L — ABNORMAL HIGH (ref 15–41)
Albumin: 2.2 g/dL — ABNORMAL LOW (ref 3.5–5.0)
Alkaline Phosphatase: 145 U/L — ABNORMAL HIGH (ref 38–126)
Anion gap: 15 (ref 5–15)
BUN: 62 mg/dL — ABNORMAL HIGH (ref 8–23)
CO2: 32 mmol/L (ref 22–32)
Calcium: 8.9 mg/dL (ref 8.9–10.3)
Chloride: 87 mmol/L — ABNORMAL LOW (ref 98–111)
Creatinine, Ser: 2.21 mg/dL — ABNORMAL HIGH (ref 0.44–1.00)
GFR, Estimated: 23 mL/min — ABNORMAL LOW (ref >60)
Glucose, Bld: 222 mg/dL — ABNORMAL HIGH (ref 70–99)
Potassium: 5.2 mmol/L — ABNORMAL HIGH (ref 3.5–5.1)
Sodium: 134 mmol/L — ABNORMAL LOW (ref 135–145)
Total Bilirubin: 0.9 mg/dL (ref 0.0–1.2)
Total Protein: 6.3 g/dL — ABNORMAL LOW (ref 6.5–8.1)

## 2023-11-10 LAB — BLOOD GAS, ARTERIAL
Acid-Base Excess: 7.9 mmol/L — ABNORMAL HIGH (ref 0.0–2.0)
Bicarbonate: 35.6 mmol/L — ABNORMAL HIGH (ref 20.0–28.0)
Drawn by: 33176
O2 Saturation: 96.3 %
Patient temperature: 36.8
pCO2 arterial: 63 mmHg — ABNORMAL HIGH (ref 32–48)
pH, Arterial: 7.36 (ref 7.35–7.45)
pO2, Arterial: 72 mmHg — ABNORMAL LOW (ref 83–108)

## 2023-11-10 LAB — CBC
HCT: 29.9 % — ABNORMAL LOW (ref 36.0–46.0)
Hemoglobin: 9.1 g/dL — ABNORMAL LOW (ref 12.0–15.0)
MCH: 26.1 pg (ref 26.0–34.0)
MCHC: 30.4 g/dL (ref 30.0–36.0)
MCV: 85.9 fL (ref 80.0–100.0)
Platelets: 273 K/uL (ref 150–400)
RBC: 3.48 MIL/uL — ABNORMAL LOW (ref 3.87–5.11)
RDW: 17.9 % — ABNORMAL HIGH (ref 11.5–15.5)
WBC: 15.9 K/uL — ABNORMAL HIGH (ref 4.0–10.5)
nRBC: 0 % (ref 0.0–0.2)

## 2023-11-10 LAB — BLOOD GAS, VENOUS
Acid-Base Excess: 12.5 mmol/L — ABNORMAL HIGH (ref 0.0–2.0)
Bicarbonate: 38.5 mmol/L — ABNORMAL HIGH (ref 20.0–28.0)
O2 Saturation: 83.3 %
Patient temperature: 37
pCO2, Ven: 58 mmHg (ref 44–60)
pH, Ven: 7.43 (ref 7.25–7.43)
pO2, Ven: 52 mmHg — ABNORMAL HIGH (ref 32–45)

## 2023-11-10 LAB — BLOOD CULTURE ID PANEL (REFLEXED) - BCID2

## 2023-11-10 LAB — GLUCOSE, CAPILLARY
Glucose-Capillary: 249 mg/dL — ABNORMAL HIGH (ref 70–99)
Glucose-Capillary: 204 mg/dL — ABNORMAL HIGH (ref 70–99)
Glucose-Capillary: 150 mg/dL — ABNORMAL HIGH (ref 70–99)
Glucose-Capillary: 219 mg/dL — ABNORMAL HIGH (ref 70–99)

## 2023-11-10 LAB — COOXEMETRY PANEL
Carboxyhemoglobin: 2.6 % — ABNORMAL HIGH (ref 0.5–1.5)
Methemoglobin: <0.7 % (ref 0.0–1.5)
O2 Saturation: 71.1 %
Total hemoglobin: 9.6 g/dL — ABNORMAL LOW (ref 12.0–16.0)

## 2023-11-10 LAB — MAGNESIUM: Magnesium: 2.2 mg/dL (ref 1.7–2.4)

## 2023-11-10 LAB — PHOSPHORUS: Phosphorus: 4.8 mg/dL — ABNORMAL HIGH (ref 2.5–4.6)

## 2023-11-10 LAB — APTT: aPTT: 84 s — ABNORMAL HIGH (ref 24–36)

## 2023-11-10 MED ORDER — STERILE WATER FOR INJECTION IJ SOLN
INTRAMUSCULAR | Status: AC
  Administered 2023-11-10 (×2): 5 mL
  Filled 2023-11-10: qty 10

## 2023-11-10 MED ORDER — BUSPIRONE HCL 5 MG PO TABS
7.5000 mg | ORAL_TABLET | Freq: Two times a day (BID) | ORAL | Status: DC
Start: 2023-11-10 — End: 2023-11-13
  Administered 2023-11-10 – 2023-11-12 (×6): 7.5 mg via ORAL
  Filled 2023-11-10 (×6): qty 2

## 2023-11-10 MED ORDER — HEPARIN (PORCINE) 25000 UT/250ML-% IV SOLN
1250.0000 [IU]/h | INTRAVENOUS | Status: DC
  Administered 2023-11-10 (×2): 1250 [IU]/h via INTRAVENOUS
  Filled 2023-11-10: qty 250

## 2023-11-10 MED ORDER — HYDROCODONE-ACETAMINOPHEN 5-325 MG PO TABS
1.0000 | ORAL_TABLET | Freq: Every day | ORAL | Status: AC | PRN
  Administered 2023-11-10 (×2): 1 via ORAL
  Filled 2023-11-10: qty 1

## 2023-11-10 MED ORDER — HEPARIN (PORCINE) 25000 UT/250ML-% IV SOLN
1250.0000 [IU]/h | INTRAVENOUS | Status: DC
  Administered 2023-11-10 – 2023-11-11 (×3): 1250 [IU]/h via INTRAVENOUS
  Filled 2023-11-10: qty 250

## 2023-11-10 MED ORDER — ACETAMINOPHEN 325 MG PO TABS
650.0000 mg | ORAL_TABLET | Freq: Once | ORAL | Status: AC
  Administered 2023-11-10 (×2): 650 mg via ORAL
  Filled 2023-11-10: qty 2

## 2023-11-10 MED ORDER — IPRATROPIUM-ALBUTEROL 0.5-2.5 (3) MG/3ML IN SOLN
3.0000 mL | Freq: Four times a day (QID) | RESPIRATORY_TRACT | Status: DC
  Administered 2023-11-10 (×2): 3 mL via RESPIRATORY_TRACT
  Filled 2023-11-10 (×2): qty 3

## 2023-11-10 MED ORDER — DAPTOMYCIN-SODIUM CHLORIDE 500-0.9 MG/50ML-% IV SOLN
8.0000 mg/kg | INTRAVENOUS | Status: DC
  Administered 2023-11-11 – 2023-11-13 (×2): 500 mg via INTRAVENOUS
  Filled 2023-11-10 (×2): qty 50

## 2023-11-10 MED ORDER — VANCOMYCIN HCL 1500 MG/300ML IV SOLN
1500.0000 mg | Freq: Once | INTRAVENOUS | Status: AC
  Administered 2023-11-10 (×2): 1500 mg via INTRAVENOUS
  Filled 2023-11-10: qty 300

## 2023-11-10 MED ORDER — IPRATROPIUM-ALBUTEROL 0.5-2.5 (3) MG/3ML IN SOLN
3.0000 mL | Freq: Four times a day (QID) | RESPIRATORY_TRACT | Status: DC | PRN
  Administered 2023-11-14: 3 mL via RESPIRATORY_TRACT
  Filled 2023-11-10: qty 3

## 2023-11-10 MED ORDER — VENLAFAXINE HCL ER 75 MG PO CP24
75.0000 mg | ORAL_CAPSULE | Freq: Every day | ORAL | Status: DC
  Administered 2023-11-11 – 2023-11-13 (×3): 75 mg via ORAL
  Filled 2023-11-10 (×3): qty 1

## 2023-11-10 MED ORDER — VANCOMYCIN HCL IN DEXTROSE 1-5 GM/200ML-% IV SOLN: 1000.0000 mg | INTRAVENOUS | Status: DC

## 2023-11-10 MED ORDER — AMIODARONE HCL 200 MG PO TABS
200.0000 mg | ORAL_TABLET | Freq: Every day | ORAL | Status: DC
  Administered 2023-11-10 – 2023-11-13 (×5): 200 mg via ORAL
  Filled 2023-11-10 (×4): qty 1

## 2023-11-10 NOTE — Progress Notes (Signed)
 Pharmacy Antibiotic Note  Maria Arroyo is a 73 y.o. female admitted on 11/04/2023 with MRSA bacteremia.  Pharmacy has been consulted for Daptomycin  dosing.  The patient is noted to have CKD with SCr 2.21 - will switch to Daptomycin  to provide some renal protection. Will utilize q48h dosing since CrCl<30 but go ahead and start dosing on 8/14. CK check with AM labs.   Plan: - Start Daptomcyin 500 mg IV every 48 hours (next dose due on 8/14) - CK checks weekly on Thursdays - Will monitor work-up, renal function, and plans for LOT  Height: 4' 11 (149.9 cm) Weight: 62.9 kg (138 lb 10.7 oz) IBW/kg (Calculated) : 43.2  Temp (24hrs), Avg:97.8 F (36.6 C), Min:97.6 F (36.4 C), Max:98.3 F (36.8 C)  Recent Labs  Lab 11/04/23 1618 11/05/23 0233 11/05/23 1229 11/06/23 0543 11/06/23 1534 11/07/23 0535 11/08/23 0556 11/09/23 0211 11/09/23 1038 11/09/23 1250 11/10/23 0442  WBC  --    < >  --  8.8  --  9.0 14.1* 19.6*  --   --  15.9*  CREATININE  --    < >  --  2.18* 2.06* 2.18* 2.08* 1.91*  --   --  2.21*  LATICACIDVEN 1.3  --  1.1  --   --   --   --   --  1.0 0.6  --    < > = values in this interval not displayed.    Estimated Creatinine Clearance: 18.6 mL/min (A) (by C-G formula based on SCr of 2.21 mg/dL (H)).    Allergies  Allergen Reactions   Ace Inhibitors Other (See Comments)    Worsening renal function    Codeine Nausea Only    Antimicrobials this admission: Rocephin  8/12 x 1 Vancomycin  8/13 x 1 Daptomycin  8/14 >>  Microbiology results: 8/12 BCx >> staph aureus (mec A detected on BCID) 8/12 COVID/flu >> neg  Thank you for allowing pharmacy to be a part of this patient's care.  Almarie Lunger, PharmD, BCPS, BCIDP Infectious Diseases Clinical Pharmacist 11/10/2023 3:00 PM   **Pharmacist phone directory can now be found on [email or web address] ([email or web address]).  Listed under [facility].

## 2023-11-10 NOTE — Plan of Care (Signed)
  Problem: Pain Managment: Goal: General experience of comfort will improve and/or be controlled Outcome: Progressing   Problem: Safety: Goal: Ability to remain free from injury will improve Outcome: Progressing   Problem: Skin Integrity: Goal: Risk for impaired skin integrity will decrease Outcome: Progressing

## 2023-11-10 NOTE — Progress Notes (Signed)
 PHARMACY - ANTICOAGULATION CONSULT NOTE  Pharmacy Consult for heparin  Indication: Afib   Allergies  Allergen Reactions   Ace Inhibitors Other (See Comments)    Worsening renal function    Codeine Nausea Only    Patient Measurements: Height: 4' 11 (149.9 cm) Weight: 62.9 kg (138 lb 10.7 oz) IBW/kg (Calculated) : 43.2 HEPARIN  DW (KG): 59  Vital Signs: Temp: 97.5 F (36.4 C) (08/13 1612) Temp Source: Oral (08/13 1612) BP: 137/70 (08/13 1612) Pulse Rate: 70 (08/13 1612)  Labs: Recent Labs    11/08/23 0556 11/09/23 0211 11/10/23 0442 11/10/23 1946  HGB 9.6* 8.9* 9.1*  --   HCT 31.5* 29.7* 29.9*  --   PLT 290 276 273  --   APTT 67*  --   --  84*  HEPARINUNFRC 1.03*  --   --   --   CREATININE 2.08* 1.91* 2.21*  --     Estimated Creatinine Clearance: 18.6 mL/min (A) (by C-G formula based on SCr of 2.21 mg/dL (H)).   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   11/03/2023 Morning   albuterol  (VENTOLIN  HFA) 108 (90 Base) MCG/ACT inhaler Inhale 2 puffs into the lungs every 6 (six) hours as needed for shortness of breath or wheezing.   11/03/2023 Noon   amiodarone  (PACERONE ) 200 MG tablet Take 1 tablet (200 mg total) by mouth daily. 90 tablet 3 11/03/2023 Morning   apixaban  (ELIQUIS ) 5 MG TABS tablet Take 1 tablet (5 mg total) by mouth 2 (two) times daily. 180 tablet 3 11/03/2023 at  8:30 PM    Ascorbic Acid (VITAMIN C PO) Take 1 tablet by mouth daily.   11/03/2023 Morning   busPIRone  (BUSPAR ) 15 MG tablet Take 15 mg by mouth 2 (two) times daily.   11/03/2023 Bedtime   Cholecalciferol  (VITAMIN D) 2000 UNITS tablet Take 2,000 Units by mouth daily.   11/03/2023 Morning   gabapentin  (NEURONTIN ) 600 MG tablet Take 600 mg by mouth in the morning, at noon, in the evening, and at bedtime.   11/03/2023 Evening   hydrALAZINE  (APRESOLINE ) 25 MG tablet Take 25 mg by mouth 3 (three) times daily.   11/03/2023 at  9:30 PM   Iron , Ferrous Sulfate , 325 (65 Fe) MG TABS Take 1 tablet by mouth daily. 30 tablet 3 11/03/2023 Morning   isosorbide  dinitrate (ISORDIL ) 10 MG tablet Take 1 tablet (10 mg total) by mouth 3 (three) times daily. 90 tablet 1 11/03/2023 at  9:30 PM   Misc. Devices MISC Inhale 3 L into the lungs continuous.   11/03/2023   Multiple Vitamin (MULTIVITAMIN WITH MINERALS) TABS tablet Take 1 tablet by mouth daily. 30 tablet 0 11/03/2023 Morning   nicotine  (NICODERM CQ  - DOSED IN MG/24 HOURS) 21 mg/24hr patch Place 1 patch (  21 mg total) onto the skin daily. 28 patch 0 11/03/2023 Morning   nitroGLYCERIN  (NITROSTAT ) 0.4 MG SL tablet PLACE 1 TABLET UNDER THE TONGUE EVERY 5 MINUTES AS NEEDED CHEST PAIN. 25 tablet 1 Unknown   potassium chloride  SA (KLOR-CON  M) 20 MEQ tablet Take 20 mEq by mouth daily.   11/03/2023 Morning   rosuvastatin  (CRESTOR ) 40 MG tablet TAKE 1 TABLET BY MOUTH DAILY 90 tablet 3 11/03/2023 Morning   torsemide  (DEMADEX ) 20 MG tablet Take 1 tablet (20 mg total) by mouth daily.   11/03/2023 Morning   venlafaxine  XR (EFFEXOR -XR) 150 MG 24 hr capsule Take 150 mg by mouth daily.  0 11/03/2023 Morning    Assessment: 73y.o. with history of Afib on Eliquis  PTA (last dose 8/6 PM) who presents with CHF exacerbation.  Pharmacy consulted for heparin  dosing.   Hgb and plts stable. No reported signs or symptoms of bleeding per MD and nurse. Patient initially started on heparin  on admission and apixaban  held > restarted  apixaban  8/11 > last dose 8/12pm - hold apixaban  with confusion - will resume heparin  drip for now Will use aptt to dose heparin  as apixaban  falsely elevates heparin  levels Cbc stable   8/13 PM update: aPTT 84 seconds No signs of bleeding / pauses with gtt  Goal of Therapy:  Heparin  level 0.3-0.7 units/ml aPTT 66-102 seconds Monitor platelets by anticoagulation protocol: Yes   Plan:  Continue heparin  infusion at 1250 units/hr Daily CBC, heparin  level, aPTT until levels are correlating F/u intervention plans, ability to switch back to DOAC Monitor H&H, plts, signs and symptoms of bleeding   Mackie Holness BS, PharmD, BCPS Clinical Pharmacist 11/10/2023 8:14 PM  Contact: [phone] after 3 PM

## 2023-11-10 NOTE — Progress Notes (Signed)
 Progress Note  Patient Name: Maria Arroyo Date of Encounter: 11/10/2023  Primary Cardiologist:   Peter Swaziland, MD   Subjective   She is much more awake today.  She has back pain and is agitated and needed Haldol  last night.    Inpatient Medications    Scheduled Meds:  acetaZOLAMIDE   500 mg Intravenous BID   apixaban   5 mg Oral BID   busPIRone   15 mg Oral BID   Chlorhexidine  Gluconate Cloth  6 each Topical Daily   ferrous sulfate   325 mg Oral Daily   hydrALAZINE   25 mg Oral TID   insulin  aspart  0-9 Units Subcutaneous TID WC   isosorbide  dinitrate  10 mg Oral TID   lidocaine   1 patch Transdermal Q24H   nicotine   21 mg Transdermal Daily   polyethylene glycol  17 g Oral Daily   potassium chloride   40 mEq Oral BID   rosuvastatin   20 mg Oral Daily   sodium chloride  flush  10-40 mL Intracatheter Q12H   sodium chloride  flush  3 mL Intravenous Q12H   sterile water  (preservative free)       venlafaxine  XR  150 mg Oral Daily   Continuous Infusions:  amiodarone  30 mg/hr (11/10/23 0244)   [START ON 11/12/2023] vancomycin      PRN Meds: acetaminophen  **OR** acetaminophen , albuterol , bisacodyl , bisacodyl , melatonin, mouth rinse, sodium chloride  flush, sterile water  (preservative free)   Vital Signs    Vitals:   11/10/23 0023 11/10/23 0425 11/10/23 0515 11/10/23 0900  BP: 130/64 (!) 141/74  110/83  Pulse: 81 73  70  Resp: 20 19  19   Temp: 97.6 F (36.4 C) 97.6 F (36.4 C)  98 F (36.7 C)  TempSrc: Axillary Oral  Oral  SpO2: 94% 91% 94% 95%  Weight:  62.9 kg    Height:        Intake/Output Summary (Last 24 hours) at 11/10/2023 0920 Last data filed at 11/09/2023 1800 Gross per 24 hour  Intake 519.13 ml  Output 400 ml  Net 119.13 ml   Filed Weights   11/08/23 0639 11/09/23 0433 11/10/23 0425  Weight: 62.6 kg 64.1 kg 62.9 kg    Telemetry    NA - Personally Reviewed  ECG    NA - Personally Reviewed  Physical Exam   GEN: No  acute distress.    Neck: No  JVD Cardiac: RRR, no murmurs, rubs, or gallops.  Respiratory: Clear   to auscultation bilaterally. GI: Soft, nontender, non-distended, normal bowel sounds  MS:  No edema; No deformity. Neuro:   Nonfocal  Psych: Oriented to self but agitated and slightly confused.    Labs    Chemistry Recent Labs  Lab 11/08/23 0556 11/09/23 0211 11/10/23 0442  NA 135 135 134*  K 3.3* 3.1* 5.2*  CL 76* 82* 87*  CO2 44* 39* 32  GLUCOSE 157* 149* 222*  BUN 51* 50* 62*  CREATININE 2.08* 1.91* 2.21*  CALCIUM  9.0 9.0 8.9  PROT 6.3* 6.0* 6.3*  ALBUMIN 2.4* 2.3* 2.2*  AST 37 35 161*  ALT 112* 78* 137*  ALKPHOS 124 110 145*  BILITOT 0.4 0.6 0.9  GFRNONAA 25* 28* 23*  ANIONGAP 15 14 15      Hematology Recent Labs  Lab 11/08/23 0556 11/09/23 0211 11/10/23 0442  WBC 14.1* 19.6* 15.9*  RBC 3.73* 3.46* 3.48*  HGB 9.6* 8.9* 9.1*  HCT 31.5* 29.7* 29.9*  MCV 84.5 85.8 85.9  MCH 25.7* 25.7* 26.1  MCHC 30.5  30.0 30.4  RDW 17.6* 17.9* 17.9*  PLT 290 276 273    Cardiac EnzymesNo results for input(s): TROPONINI in the last 168 hours. No results for input(s): TROPIPOC in the last 168 hours.   BNP Recent Labs  Lab 11/04/23 0439  BNP 1,970.1*     DDimer No results for input(s): DDIMER in the last 168 hours.   Radiology    CT CHEST WO CONTRAST Result Date: 11/10/2023 CLINICAL DATA:  Respiratory difficulty EXAM: CT CHEST WITHOUT CONTRAST TECHNIQUE: Multidetector CT imaging of the chest was performed following the standard protocol without IV contrast. RADIATION DOSE REDUCTION: This exam was performed according to the departmental dose-optimization program which includes automated exposure control, adjustment of the mA and/or kV according to patient size and/or use of iterative reconstruction technique. COMPARISON:  Chest x-ray from earlier in the same day. FINDINGS: Cardiovascular: Somewhat limited due to lack of IV contrast. Atherosclerotic calcifications of the aorta are  noted. No aneurysmal dilatation is seen. Heart is enlarged in size. Coronary calcifications are noted. Cardiac blood pool is decreased suggesting underlying anemia. Mediastinum/Nodes: Thoracic inlet is within normal limits. Postsurgical changes in the lower cervical spine are seen. The esophagus is within normal limits. No adenopathy is seen. Lungs/Pleura: Lungs demonstrate diffuse emphysematous changes. No sizable parenchymal nodule is noted. Moderate right-sided effusion is noted with underlying consolidation of the right lower lobe. Upper Abdomen: Scattered nonobstructing renal calculi are noted bilaterally. Musculoskeletal: Fractures involving the right eighth through tenth ribs posteriorly are noted. No left-sided rib fractures are seen. IMPRESSION: Multiple right-sided rib fractures. Associated effusion and lower lobe consolidation on the right is seen. No pneumothorax is noted. Changes suggestive of anemia. Aortic Atherosclerosis (ICD10-I70.0) and Emphysema (ICD10-J43.9). Electronically Signed   By: Oneil Devonshire M.D.   On: 11/10/2023 00:03   DG CHEST PORT 1 VIEW Result Date: 11/09/2023 CLINICAL DATA:  Altered mental status. EXAM: PORTABLE CHEST 1 VIEW COMPARISON:  November 04, 2023. FINDINGS: Stable cardiomegaly. Mild central pulmonary vascular congestion is noted. Possible mild bilateral pulmonary edema is noted. Bilateral pleural effusions are noted, right greater than left. Bony thorax is unremarkable. IMPRESSION: Stable cardiomegaly with mild central pulmonary vascular congestion and possible mild bilateral pulmonary edema. Pleural effusions as noted above. Electronically Signed   By: Lynwood Landy Raddle M.D.   On: 11/09/2023 11:22     Cardiac Studies   Echo See above.    Patient Profile     73 y.o. female with CAD, HTN, HLD, PAF, chronic diastolic heart failure, chronic respiratory failure, DM II, Squamous cell carcinoma s/p R lobectomy 20', tobacco abuse and depression. AHF team to see for acute  combined systolic and diastolic heart failure and suspected low output HF.   Assessment & Plan    Acute combined systolic and diastolic heart failure:   Possible ischemic etiology.  Note the EF is 30 - 35% as above.  EF had appeared to be 55% in 2020.  Holding on cath with increased creatinine.   Milrinone  stopped yesterday.  Dose had been weaned and it was stopped with a picture of possible evolving sepsis.  She is getting low dose hydralazine  and nitrates.  Holding diuretic with decreased renal function.   Net negative 10.8 liters this admission.   CVP 7,   CoOx 71.1.    Continue to hold diuresis.     Hypokalemia:   Now hyperkalemic.  Stop potassium and follow BMET later this afternoon.  Now with renal insufficiency so will follow closely.  CAD:    Medical management.  Holding off on cath as above.    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  Follow.    Paroxysmal A-fib:     Back in atrial fib yesterday.  Started on IV amio.  Stopped Eliquis  and will use heparin  as she is acutely ill.  Back in NSR.  Can go back to PO amio.    COPD/history of lobectomy for lung cancer  DM/HL :  Continue current therapy.    Confusion:    Improved somnolence.  Now agitated with pain.  I have contacted the primary service to see if we can resume pain med.   Hypercapnea:  Improved  Anemia:   Hemoglobin stable.  Iron  studies OK on PO iron  supplement.  No change in therapy.   Leukocytosis: Antibiotics per primary team and evaluation as above.   WBC is improved.   For questions or updates, please contact [facility] Please consult [email or web address] for contact info under Cardiology/STEMI.   Signed, Lynwood Schilling, MD  11/10/2023, 9:20 AM

## 2023-11-10 NOTE — Progress Notes (Signed)
 PHARMACY - ANTICOAGULATION CONSULT NOTE  Pharmacy Consult for heparin  Indication: Afib   Allergies  Allergen Reactions   Ace Inhibitors Other (See Comments)    Worsening renal function    Codeine Nausea Only    Patient Measurements: Height: 4' 11 (149.9 cm) Weight: 62.9 kg (138 lb 10.7 oz) IBW/kg (Calculated) : 43.2 HEPARIN  DW (KG): 59  Vital Signs: Temp: 98.3 F (36.8 C) (08/13 1135) Temp Source: Oral (08/13 1135) BP: 127/64 (08/13 1135) Pulse Rate: 70 (08/13 0900)  Labs: Recent Labs    11/07/23 1700 11/08/23 0556 11/08/23 0556 11/09/23 0211 11/10/23 0442  HGB  --  9.6*   < > 8.9* 9.1*  HCT  --  31.5*  --  29.7* 29.9*  PLT  --  290  --  276 273  APTT 66* 67*  --   --   --   HEPARINUNFRC 0.85* 1.03*  --   --   --   CREATININE  --  2.08*  --  1.91* 2.21*   < > = values in this interval not displayed.    Estimated Creatinine Clearance: 18.6 mL/min (A) (by C-G formula based on SCr of 2.21 mg/dL (H)).   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   11/03/2023 Morning   albuterol  (VENTOLIN  HFA) 108 (90 Base) MCG/ACT inhaler Inhale 2 puffs into the lungs every 6 (six) hours as needed for shortness of breath or wheezing.   11/03/2023 Noon   amiodarone  (PACERONE ) 200 MG tablet Take 1 tablet (200 mg total) by mouth daily. 90 tablet 3 11/03/2023 Morning   apixaban  (ELIQUIS ) 5 MG TABS tablet  Take 1 tablet (5 mg total) by mouth 2 (two) times daily. 180 tablet 3 11/03/2023 at  8:30 PM   Ascorbic Acid (VITAMIN C PO) Take 1 tablet by mouth daily.   11/03/2023 Morning   busPIRone  (BUSPAR ) 15 MG tablet Take 15 mg by mouth 2 (two) times daily.   11/03/2023 Bedtime   Cholecalciferol  (VITAMIN D) 2000 UNITS tablet Take 2,000 Units by mouth daily.   11/03/2023 Morning   gabapentin  (NEURONTIN ) 600 MG tablet Take 600 mg by mouth in the morning, at noon, in the evening, and at bedtime.   11/03/2023 Evening   hydrALAZINE  (APRESOLINE ) 25 MG tablet Take 25 mg by mouth 3 (three) times daily.   11/03/2023 at  9:30 PM   Iron , Ferrous Sulfate , 325 (65 Fe) MG TABS Take 1 tablet by mouth daily. 30 tablet 3 11/03/2023 Morning   isosorbide  dinitrate (ISORDIL ) 10 MG tablet Take 1 tablet (10 mg total) by mouth 3 (three) times daily. 90 tablet 1 11/03/2023 at  9:30 PM   Misc. Devices MISC Inhale 3 L into the lungs continuous.   11/03/2023   Multiple Vitamin (MULTIVITAMIN WITH  MINERALS) TABS tablet Take 1 tablet by mouth daily. 30 tablet 0 11/03/2023 Morning   nicotine  (NICODERM CQ  - DOSED IN MG/24 HOURS) 21 mg/24hr patch Place 1 patch (21 mg total) onto the skin daily. 28 patch 0 11/03/2023 Morning   nitroGLYCERIN  (NITROSTAT ) 0.4 MG SL tablet PLACE 1 TABLET UNDER THE TONGUE EVERY 5 MINUTES AS NEEDED CHEST PAIN. 25 tablet 1 Unknown   potassium chloride  SA (KLOR-CON  M) 20 MEQ tablet Take 20 mEq by mouth daily.   11/03/2023 Morning   rosuvastatin  (CRESTOR ) 40 MG tablet TAKE 1 TABLET BY MOUTH DAILY 90 tablet 3 11/03/2023 Morning   torsemide  (DEMADEX ) 20 MG tablet Take 1 tablet (20 mg total) by mouth daily.   11/03/2023 Morning   venlafaxine  XR (EFFEXOR -XR) 150 MG 24 hr capsule Take 150 mg by mouth daily.  0 11/03/2023 Morning    Assessment: 72y.o. with history of Afib on Eliquis  PTA (last dose 8/6 PM) who presents with CHF exacerbation.  Pharmacy consulted for heparin  dosing.   Hgb and plts stable. No reported signs or symptoms of bleeding per  MD and nurse. Patient initially started on heparin  on admission and apixaban  held > restarted apixaban  8/11 > last dose 8/12pm - hold apixaban  with confusion - will resume heparin  drip for now Will use aptt to dose heparin  as apixaban  falsely elevates heparin  levels Cbc stable   Goal of Therapy:  Heparin  level 0.3-0.7 units/ml aPTT 66-102 seconds Monitor platelets by anticoagulation protocol: Yes   Plan:  Resume heparin  infusion at 1250 units/hr Aptt 6hr after restart  Daily CBC, heparin  level, aPTT until levels are correlating F/u intervention plans, ability to switch back to DOAC Monitor H&H, plts, signs and symptoms of bleeding   Olam Chalk Pharm.D. CPP, BCPS Clinical Pharmacist [phone] 11/10/2023 11:41 AM

## 2023-11-10 NOTE — Progress Notes (Signed)
 Pt had bleeding from PICC line removal site (left upper arm). No changes in bleeding per IV team RN. Pressure dressing applied. Heparin  re-timed per pharmacist, Olam.

## 2023-11-10 NOTE — Progress Notes (Signed)
 PHARMACY - PHYSICIAN COMMUNICATION CRITICAL VALUE ALERT - BLOOD CULTURE IDENTIFICATION (BCID)  Maria Arroyo is an 73 y.o. female who presented to [facility] on [facility]/04/2023 with a chief complaint worsening breathing and swelling s/p recent hospitalization w/ AMS  Assessment:  4/4 blood cultures MRSA; WBC 14 > 19, afebrile, sCr 1.91 (bl~2-2.1)  Name of physician (or Provider) Contacted: Dr. Franky   Current antibiotics: Ceftriaxone  2g IV every 24 hours for sepsis  Changes to prescribed antibiotics recommended:   -D/c ceftriaxone   -Vancomycin  1500mg  IV x1 -Vancomycin  1000mg  IV every 48 hours (AUC 556, IBW, Vd 0.72, sCr 1.91) -Monitor renal function -Follow up signs of clinical improvement, LOT, de-escalation of antibiotics   Results for orders placed or performed during the hospital encounter of 11/04/23  Blood Culture ID Panel (Reflexed) (Collected: 11/09/2023 10:38 AM)  Result Value Ref Range   Enterococcus faecalis NOT DETECTED NOT DETECTED   Enterococcus Faecium NOT DETECTED NOT DETECTED   Listeria monocytogenes NOT DETECTED NOT DETECTED   Staphylococcus species DETECTED (A) NOT DETECTED   Staphylococcus aureus (BCID) DETECTED (A) NOT DETECTED   Staphylococcus epidermidis NOT DETECTED NOT DETECTED   Staphylococcus lugdunensis NOT DETECTED NOT DETECTED   Streptococcus species NOT DETECTED NOT DETECTED   Streptococcus agalactiae NOT DETECTED NOT DETECTED   Streptococcus pneumoniae NOT DETECTED NOT DETECTED   Streptococcus pyogenes NOT DETECTED NOT DETECTED   A.calcoaceticus-baumannii NOT DETECTED NOT DETECTED   Bacteroides fragilis NOT DETECTED NOT DETECTED   Enterobacterales NOT DETECTED NOT DETECTED   Enterobacter cloacae complex NOT DETECTED NOT DETECTED   Escherichia coli NOT DETECTED NOT DETECTED   Klebsiella aerogenes NOT DETECTED NOT DETECTED   Klebsiella oxytoca NOT DETECTED NOT DETECTED   Klebsiella pneumoniae NOT DETECTED NOT DETECTED   Proteus species  NOT DETECTED NOT DETECTED   Salmonella species NOT DETECTED NOT DETECTED   Serratia marcescens NOT DETECTED NOT DETECTED   Haemophilus influenzae NOT DETECTED NOT DETECTED   Neisseria meningitidis NOT DETECTED NOT DETECTED   Pseudomonas aeruginosa NOT DETECTED NOT DETECTED   Stenotrophomonas maltophilia NOT DETECTED NOT DETECTED   Candida albicans NOT DETECTED NOT DETECTED   Candida auris NOT DETECTED NOT DETECTED   Candida glabrata NOT DETECTED NOT DETECTED   Candida krusei NOT DETECTED NOT DETECTED   Candida parapsilosis NOT DETECTED NOT DETECTED   Candida tropicalis NOT DETECTED NOT DETECTED   Cryptococcus neoformans/gattii NOT DETECTED NOT DETECTED   Meth resistant mecA/C and MREJ DETECTED (A) NOT DETECTED    Lynwood Poplar, PharmD, BCPS Clinical Pharmacist 11/10/2023 2:05 AM

## 2023-11-10 NOTE — Consult Note (Signed)
 [facility]    Date of Admission:  11/04/2023     Total days of antibiotics 1  Daptomycin  8/13 >> c Vancomycin  8/12 >> 8/13  Ceftriaxone  8/12 >> 8/13        Reason for Consult: MRSA bacteremia     Referring Provider: autoconsult  Primary Care Provider: Aminta Lamar Hamilton, MD   Assessment: Roniyah Llorens is a 73 y.o. female admitted from home with:   Acute CHF -  Peripheral Edema, SOB -  Diureseed > 20 lbs with milrinone  and diuretics. Now off milrinone .   MRSA Bacteremia, Nosocomial Onset -  PICC placed 8/8 -  Back pain -  Concern for CLABSI. Will need PICC out and PIV placed for ongoing IV abx. Will repeat bcx in AM if PICC out to document clearance. She has some erythema/ecchymosis on the left arm but this seems more c/w veinipuncture sites and AC effect not PIV related. No wounds on feet or anywhere else.  Will follow up TTE and consider TEE .  Back pain acute on chronic. Unclear if related to medication titrations due to encephalopathy or acutely seeded hardware/discitis picture. Will need imaging of her spine when she is able to sit still for quality study. She is moving all extremities on exam today and does not seem to have any concern for neurologic deficits.  - Will switch to Daptomycin  for treatment  - FU back exams for timing of imaging - Please remove PICC and place PIV  - will repeat BCx after removed.  - TTE ordered (done earlier in admission) but new indication to r/o heart valves.  - Will need to consider TEE.   D/W Dr. Fairy at the bedside today recommendations.   Plan: - Will switch to Daptomycin  for treatment  - FU back exams for timing of imaging for compliant study  - Please remove PICC and place PIV  - will repeat BCx after removed.  - TTE ordered (done earlier in admission) but new indication to r/o heart valves.  - Will need to consider TEE.     Principal Problem:   Acute HFrEF (heart failure  with reduced ejection fraction) (HCC) Active Problems:   Coronary artery disease   Essential hypertension   Cigarette smoker   Anxiety and depression   Atrial fibrillation (HCC)   COPD (chronic obstructive pulmonary disease) (HCC)   Controlled type 2 diabetes mellitus without complication, without long-term current use of insulin  (HCC)   Acute kidney injury superimposed on chronic kidney disease (HCC)   Elevated troponin   Long QT interval   Normocytic anemia   History of lung cancer   Acute on chronic diastolic heart failure (HCC)   Acute hypoxic respiratory failure (HCC)   NSTEMI (non-ST elevated myocardial infarction) (HCC)    acetaZOLAMIDE   500 mg Intravenous BID   busPIRone   15 mg Oral BID   Chlorhexidine  Gluconate Cloth  6 each Topical Daily   ferrous sulfate   325 mg Oral Daily   hydrALAZINE   25 mg Oral TID   insulin  aspart  0-9 Units Subcutaneous TID WC   isosorbide  dinitrate  10 mg Oral TID   lidocaine   1 patch Transdermal Q24H   nicotine   21 mg Transdermal Daily   polyethylene glycol  17 g Oral Daily   rosuvastatin   20 mg Oral Daily   sodium chloride  flush  10-40 mL Intracatheter Q12H   sodium chloride  flush  3 mL Intravenous Q12H  venlafaxine  XR  150 mg Oral Daily    HPI: Lynora Dymond is a 73 y.o. female admitted from home with acute CHF/volume overload  Fell at home and had a big bruise on her back. Her daughter mentions that she has been progressively more confused in the last few days. Her pain medications have been held/changed recently due to confusion/AMS and now with severe back pain. She has had numerous back surgeries with hardware placed. Has had chronic narcotics for pain management in the past but she does not feel she has been on these regularly recently.  Her daughter does say she is much more alert today. Still confused and unable to orient.   Principle concern for admission was fluid build up legs looked like tree trunks and  difficulty breathing. NO reported problems with fevers or chills PTA.  She had PICC Line placed for inotropes and co-ox monitoring on 8/8. Developed leukocytosis on 8/10, fever on 8/11 in addition to encephalopathy which may be confounded by pain medications.  Blood cultures from 8/12 growing out MRSA from all bottles collected.    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 11/07/2018   COPD (chronic obstructive pulmonary disease) (HCC)    Coronary artery disease    Depression    Gout    R foot and knee   Hyperlipidemia    Hypertension    Left foot drop    Neuropathy    OA (osteoarthritis)    Pica    Tobacco abuse     Social History   Tobacco Use   Smoking status: Every Day    Current packs/day: 2.00    Average packs/day: 2.0 packs/day for 35.0 years (70.0 ttl pk-yrs)    Types: Cigarettes   Smokeless tobacco: Never   Tobacco comments:    1-1.5 ppd (10/02/13), 03/17/18 2 PPD  Vaping Use   Vaping status: Never Used  Substance Use Topics   Alcohol  use: No    Alcohol /week: 0.0 standard drinks of alcohol    Drug use: No    Family History  Problem Relation Age of Onset   Aneurysm Mother    Heart attack Father    Heart failure Father    Stroke Sister    Heart attack Brother    Allergies  Allergen Reactions   Ace Inhibitors Other (See Comments)    Worsening renal function    Codeine Nausea Only    OBJECTIVE: Blood pressure 110/83, pulse 70, temperature 98 F (36.7 C), temperature source Oral, resp. rate 19, height 4' 11 (1.499 m), weight 62.9 kg, SpO2 95%.  Physical Exam Constitutional:      Appearance: She is well-developed. She is ill-appearing.  Cardiovascular:     Rate and Rhythm: Normal rate.     Heart sounds: No murmur heard.    Comments: Challenging to assess with her  yelling/vocalizing Pulmonary:     Effort: Pulmonary effort is normal.     Breath sounds: Normal breath sounds.  Skin:    General: Skin is warm and dry.     Findings: Ecchymosis and erythema (left inner forearm with surrounding ecchymosis. No cording. No previous IV site here from records.) present.     Comments: Left anterior FA with some erythema/ bruising noted. Not warm or tender. LUE  PICC Line appears normal.   Neurological:     Comments: Alert, agitated.      Lab Results Lab Results  Component Value Date   WBC 15.9 (H) 11/10/2023   HGB 9.1 (L) 11/10/2023   HCT 29.9 (L) 11/10/2023   MCV 85.9 11/10/2023   PLT 273 11/10/2023    Lab Results  Component Value Date   CREATININE 2.21 (H) 11/10/2023   BUN 62 (H) 11/10/2023   NA 134 (L) 11/10/2023   K 5.2 (H) 11/10/2023   CL 87 (L) 11/10/2023   CO2 32 11/10/2023    Lab Results  Component Value Date   ALT 137 (H) 11/10/2023   AST 161 (H) 11/10/2023   ALKPHOS 145 (H) 11/10/2023   BILITOT 0.9 11/10/2023     Microbiology: Recent Results (from the past 240 hours)  Resp panel by RT-PCR (RSV, Flu A&B, Covid) Anterior Nasal Swab     Status: None   Collection Time: 11/09/23 10:19 AM   Specimen: Anterior Nasal Swab  Result Value Ref Range Status   SARS Coronavirus 2 by RT PCR NEGATIVE NEGATIVE Final   Influenza A by PCR NEGATIVE NEGATIVE Final   Influenza B by PCR NEGATIVE NEGATIVE Final    Comment: (NOTE) The Xpert Xpress SARS-CoV-2/FLU/RSV plus assay is intended as an aid in the diagnosis of influenza from Nasopharyngeal swab specimens and should not be used as a sole basis for treatment. Nasal washings and aspirates are unacceptable for Xpert Xpress SARS-CoV-2/FLU/RSV testing.  Fact Sheet for Patients: BloggerCourse.com  Fact Sheet for Healthcare Providers: SeriousBroker.it  This test is not yet approved or cleared by the [place]  FDA and has been authorized for  detection and/or diagnosis of SARS-CoV-2 by FDA under an Emergency Use Authorization (EUA). This EUA will remain in effect (meaning this test can be used) for the duration of the COVID-19 declaration under Section 564(b)(1) of the Act, 21 U.S.C. section 360bbb-3(b)(1), unless the authorization is terminated or revoked.     Resp Syncytial Virus by PCR NEGATIVE NEGATIVE Final    Comment: (NOTE) Fact Sheet for Patients: BloggerCourse.com  Fact Sheet for Healthcare Providers: SeriousBroker.it  This test is not yet approved or cleared by the [place]  FDA and has been authorized for detection and/or diagnosis of SARS-CoV-2 by FDA under an Emergency Use Authorization (EUA). This EUA will remain in effect (meaning this test can be used) for the duration of the COVID-19 declaration under Section 564(b)(1) of the Act, 21 U.S.C. section 360bbb-3(b)(1), unless the authorization is terminated or revoked.  Performed at [facility], [address] [place], [place] [address]   Culture, blood (Routine X 2) w Reflex to ID Panel     Status: Abnormal (Preliminary result)   Collection Time: 11/09/23 10:25 AM   Specimen: BLOOD RIGHT HAND  Result Value Ref Range Status   Specimen Description BLOOD RIGHT HAND  Final   Special Requests   Final    BOTTLES DRAWN AEROBIC AND ANAEROBIC Blood Culture adequate volume   Culture  Setup Time   Final    GRAM POSITIVE COCCI IN CLUSTERS IN BOTH AEROBIC AND ANAEROBIC BOTTLES CRITICAL VALUE NOTED.  VALUE IS CONSISTENT WITH PREVIOUSLY REPORTED AND CALLED VALUE. Performed at [facility], [address], [place], [place] [address]    Culture STAPHYLOCOCCUS AUREUS (A)  Final   Report Status PENDING  Incomplete  Culture, blood (Routine X 2) w Reflex to ID Panel     Status: Abnormal (Preliminary result)  Collection Time: 11/09/23 10:38 AM   Specimen: BLOOD RIGHT ARM  Result Value Ref Range Status    Specimen Description BLOOD RIGHT ARM  Final   Special Requests   Final    BOTTLES DRAWN AEROBIC AND ANAEROBIC Blood Culture adequate volume   Culture  Setup Time   Final    GRAM POSITIVE COCCI IN CLUSTERS IN BOTH AEROBIC AND ANAEROBIC BOTTLES CRITICAL RESULT CALLED TO, READ BACK BY AND VERIFIED WITH: PHARMD J [facility] 11/10/2023 @ 0159 BY AB    Culture (A)  Final    STAPHYLOCOCCUS AUREUS SUSCEPTIBILITIES TO FOLLOW Performed at [facility], [address], [place], [place] [address]    Report Status PENDING  Incomplete  Blood Culture ID Panel (Reflexed)     Status: Abnormal   Collection Time: 11/09/23 10:38 AM  Result Value Ref Range Status   Enterococcus faecalis NOT DETECTED NOT DETECTED Final   Enterococcus Faecium NOT DETECTED NOT DETECTED Final   Listeria monocytogenes NOT DETECTED NOT DETECTED Final   Staphylococcus species DETECTED (A) NOT DETECTED Final    Comment: CRITICAL RESULT CALLED TO, READ BACK BY AND VERIFIED WITH: PHARMD J [facility] 11/10/2023 @ 0159 BY AB    Staphylococcus aureus (BCID) DETECTED (A) NOT DETECTED Final    Comment: Methicillin (oxacillin)-resistant Staphylococcus aureus (MRSA). MRSA is predictably resistant to beta-lactam antibiotics (except ceftaroline). Preferred therapy is vancomycin  unless clinically contraindicated. Patient requires contact precautions if  hospitalized. CRITICAL RESULT CALLED TO, READ BACK BY AND VERIFIED WITH: PHARMD J [facility] 11/10/2023 @ 0159 BY AB    Staphylococcus epidermidis NOT DETECTED NOT DETECTED Final   Staphylococcus lugdunensis NOT DETECTED NOT DETECTED Final   Streptococcus species NOT DETECTED NOT DETECTED Final   Streptococcus agalactiae NOT DETECTED NOT DETECTED Final   Streptococcus pneumoniae NOT DETECTED NOT DETECTED Final   Streptococcus pyogenes NOT DETECTED NOT DETECTED Final   A.calcoaceticus-baumannii NOT DETECTED NOT DETECTED Final   Bacteroides fragilis NOT DETECTED NOT DETECTED Final    Enterobacterales NOT DETECTED NOT DETECTED Final   Enterobacter cloacae complex NOT DETECTED NOT DETECTED Final   Escherichia coli NOT DETECTED NOT DETECTED Final   Klebsiella aerogenes NOT DETECTED NOT DETECTED Final   Klebsiella oxytoca NOT DETECTED NOT DETECTED Final   Klebsiella pneumoniae NOT DETECTED NOT DETECTED Final   Proteus species NOT DETECTED NOT DETECTED Final   Salmonella species NOT DETECTED NOT DETECTED Final   Serratia marcescens NOT DETECTED NOT DETECTED Final   Haemophilus influenzae NOT DETECTED NOT DETECTED Final   Neisseria meningitidis NOT DETECTED NOT DETECTED Final   Pseudomonas aeruginosa NOT DETECTED NOT DETECTED Final   Stenotrophomonas maltophilia NOT DETECTED NOT DETECTED Final   Candida albicans NOT DETECTED NOT DETECTED Final   Candida auris NOT DETECTED NOT DETECTED Final   Candida glabrata NOT DETECTED NOT DETECTED Final   Candida krusei NOT DETECTED NOT DETECTED Final   Candida parapsilosis NOT DETECTED NOT DETECTED Final   Candida tropicalis NOT DETECTED NOT DETECTED Final   Cryptococcus neoformans/gattii NOT DETECTED NOT DETECTED Final   Meth resistant mecA/C and MREJ DETECTED (A) NOT DETECTED Final    Comment: CRITICAL RESULT CALLED TO, READ BACK BY AND VERIFIED WITH: PHARMD J [facility] 11/10/2023 @ 0159 BY AB Performed at [facility], [address] [place], [place] [address]     Shaylon Gillean, MSN, NP-C [facility] Pager: [phone]  11/10/2023 10:44 AM    Total Encounter Time: 20 min

## 2023-11-10 NOTE — Progress Notes (Addendum)
 PROGRESS NOTE    Ady Heimann  FMW:[id number] DOB: 06-Dec-1950 DOA: 11/04/2023 PCP: Aminta Lamar Hamilton, MD  72/F with history of chronic back pain, paroxysmal A-fib, hypertension, chronic diastolic CHF, COPD, chronic respiratory failure on 3 L home O2, lung cancer with right lobectomy in 2020, type 2 diabetes, depression, tobacco use presented to the ED with worsening dyspnea and CHF. - Diagnosed with new onset CHF, troponin 1139, EF down to 30-35% with BiV failure, cardiology following, briefly on milrinone , subsequently discontinued 8/12 -8/11 onwards became lethargic with leukocytosis, - 11/09/11 early a.m. blood cultures growing MRSA   Subjective: Obtunded, but better compared to yesterday per daughter  Assessment and Plan:  MRSA bacteremia Sepsis, not POA - Developed low-grade fever, leukocytosis and encephalopathy over the last 48 hours, blood cultures now growing MRSA,, appreciate ID input, vancomycin  changed to daptomycin  - Will remove PICC line,  - Anticipate need for TEE and MRI of her spine over the next few days when mental status is more appropriate  Encephalopathy - Suspect primarily secondary to sepsis and MRSA bacteremia - Also check ABG to rule out hypercarbia - Also decrease BuSpar  and Effexor  dose  Acute on chronic systolic and diastolic CHF New biventricular failure -Echo this admission noted EF down to 30-35%, moderately reduced RV, severely elevated dilated BAE - Concern for ischemic cardiomyopathy, troponin peaked at 1139, prior history of LAD stenting -Was on IV milrinone  for approximately 48 hours, now off - Deferring ischemic eval at this time in the setting of AKI, encephalopathy etc., cards following - Diuretics on hold at this time  NSTEMI - Troponin peaked at 1139, then trending down - Conservative management at this time in the setting of sepsis, encephalopathy, AKI etc.  Acute kidney injury on CKD 4 - Worsening creatinine in the  setting of sepsis, likely ATN, hold additional diuretics today, monitor  Multiple rib fractures - Family reports multiple recent falls - Needs pulmonary toilet, add nebs, incentive spirometry when more awake  Hypokalemia Repleted  Transaminitis - Likely from congestion of HFrEF.  LFTs showing improvement this morning.  Will recheck in AM.   Diabetes mellitus - CBGs elevated, add Semglee    Iron  deficiency anemia - No overt bleeding.  Monitor   Paroxysmal atrial fibrillation - Now off IV amiodarone , IV heparin    History of squamous cell carcinoma of the right lung - S/p right lobectomy in 2020.  COPD/chronic respiratory failure On 3 L home O2 at baseline   Tobacco abuse - Still smokes.Nicotine  patch was offered.   Obesity class I - BMI nearly 32.  Encouraged lifestyle modification and weight loss.   DVT prophylaxis: IV heparin  Code Status: Full code, discussed CODE STATUS with the daughter, she will discuss this with her father Family Communication: Daughter at bedside Disposition Plan: TBD  Objective: Vitals:   11/10/23 0023 11/10/23 0425 11/10/23 0515 11/10/23 0900  BP: 130/64 (!) 141/74  110/83  Pulse: 81 73  70  Resp: 20 19  19   Temp: 97.6 F (36.4 C) 97.6 F (36.4 C)  98 F (36.7 C)  TempSrc: Axillary Oral  Oral  SpO2: 94% 91% 94% 95%  Weight:  62.9 kg    Height:        Intake/Output Summary (Last 24 hours) at 11/10/2023 1122 Last data filed at 11/10/2023 0926 Gross per 24 hour  Intake 400.85 ml  Output 400 ml  Net 0.85 ml   Filed Weights   11/08/23 0639 11/09/23 0433 11/10/23 0425  Weight: 62.6 kg  64.1 kg 62.9 kg    Examination:  General exam: Obtunded, somnolent responds to painful stimuli Respiratory system: Poor air movement bilaterally Cardiovascular system: S1 & S2 heard, regular Abd: nondistended, soft and nontender.Normal bowel sounds heard. Central nervous system: Obtunded, somnolent Extremities: Trace edema, Unna boots on, PICC  line Skin: No rashes Psychiatry: Unable to assess    Data Reviewed:   CBC: Recent Labs  Lab 11/06/23 0543 11/07/23 0535 11/08/23 0556 11/09/23 0211 11/10/23 0442  WBC 8.8 9.0 14.1* 19.6* 15.9*  HGB 8.3* 8.9* 9.6* 8.9* 9.1*  HCT 27.2* 29.4* 31.5* 29.7* 29.9*  MCV 84.7 85.7 84.5 85.8 85.9  PLT 253 264 290 276 273   Basic Metabolic Panel: Recent Labs  Lab 11/06/23 0543 11/06/23 1534 11/07/23 0535 11/08/23 0556 11/09/23 0211 11/10/23 0442  NA 134* 134* 134* 135 135 134*  K 2.7* 3.9 3.7 3.3* 3.1* 5.2*  CL 80* 77* 79* 76* 82* 87*  CO2 41* 43* 43* 44* 39* 32  GLUCOSE 147* 188* 122* 157* 149* 222*  BUN 59* 61* 54* 51* 50* 62*  CREATININE 2.18* 2.06* 2.18* 2.08* 1.91* 2.21*  CALCIUM  8.8* 8.7* 8.7* 9.0 9.0 8.9  MG 2.3  --  2.0 2.0 2.2 2.2  PHOS  --   --   --   --   --  4.8*   GFR: Estimated Creatinine Clearance: 18.6 mL/min (A) (by C-G formula based on SCr of 2.21 mg/dL (H)). Liver Function Tests: Recent Labs  Lab 11/06/23 0543 11/07/23 0535 11/08/23 0556 11/09/23 0211 11/10/23 0442  AST 108* 59* 37 35 161*  ALT 236* 167* 112* 78* 137*  ALKPHOS 172* 151* 124 110 145*  BILITOT 0.5 0.8 0.4 0.6 0.9  PROT 6.0* 5.9* 6.3* 6.0* 6.3*  ALBUMIN 2.6* 2.5* 2.4* 2.3* 2.2*   No results for input(s): LIPASE, AMYLASE in the last 168 hours. Recent Labs  Lab 11/08/23 1130  AMMONIA 41*   Coagulation Profile: No results for input(s): INR, PROTIME in the last 168 hours. Cardiac Enzymes: No results for input(s): CKTOTAL, CKMB, CKMBINDEX, TROPONINI in the last 168 hours. BNP (last 3 results) No results for input(s): PROBNP in the last 8760 hours. HbA1C: No results for input(s): HGBA1C in the last 72 hours. CBG: Recent Labs  Lab 11/09/23 0620 11/09/23 1130 11/09/23 1631 11/09/23 2052 11/10/23 0602  GLUCAP 151* 143* 170* 185* 249*   Lipid Profile: No results for input(s): CHOL, HDL, LDLCALC, TRIG, CHOLHDL, LDLDIRECT in the last 72  hours. Thyroid Function Tests: No results for input(s): TSH, T4TOTAL, FREET4, T3FREE, THYROIDAB in the last 72 hours. Anemia Panel: No results for input(s): VITAMINB12, FOLATE, FERRITIN, TIBC, IRON , RETICCTPCT in the last 72 hours. Urine analysis:    Component Value Date/Time   COLORURINE YELLOW 11/09/2023 1155   APPEARANCEUR HAZY (A) 11/09/2023 1155   LABSPEC 1.015 11/09/2023 1155   PHURINE 8.0 11/09/2023 1155   GLUCOSEU NEGATIVE 11/09/2023 1155   HGBUR NEGATIVE 11/09/2023 1155   BILIRUBINUR NEGATIVE 11/09/2023 1155   KETONESUR NEGATIVE 11/09/2023 1155   PROTEINUR 30 (A) 11/09/2023 1155   NITRITE POSITIVE (A) 11/09/2023 1155   LEUKOCYTESUR NEGATIVE 11/09/2023 1155   Sepsis Labs: @LABRCNTIP (procalcitonin:4,lacticidven:4)  ) Recent Results (from the past 240 hours)  Resp panel by RT-PCR (RSV, Flu A&B, Covid) Anterior Nasal Swab     Status: None   Collection Time: 11/09/23 10:19 AM   Specimen: Anterior Nasal Swab  Result Value Ref Range Status   SARS Coronavirus 2 by RT PCR NEGATIVE NEGATIVE Final  Influenza A by PCR NEGATIVE NEGATIVE Final   Influenza B by PCR NEGATIVE NEGATIVE Final    Comment: (NOTE) The Xpert Xpress SARS-CoV-2/FLU/RSV plus assay is intended as an aid in the diagnosis of influenza from Nasopharyngeal swab specimens and should not be used as a sole basis for treatment. Nasal washings and aspirates are unacceptable for Xpert Xpress SARS-CoV-2/FLU/RSV testing.  Fact Sheet for Patients: BloggerCourse.com  Fact Sheet for Healthcare Providers: SeriousBroker.it  This test is not yet approved or cleared by the [place]  FDA and has been authorized for detection and/or diagnosis of SARS-CoV-2 by FDA under an Emergency Use Authorization (EUA). This EUA will remain in effect (meaning this test can be used) for the duration of the COVID-19 declaration under Section 564(b)(1) of the Act,  21 U.S.C. section 360bbb-3(b)(1), unless the authorization is terminated or revoked.     Resp Syncytial Virus by PCR NEGATIVE NEGATIVE Final    Comment: (NOTE) Fact Sheet for Patients: BloggerCourse.com  Fact Sheet for Healthcare Providers: SeriousBroker.it  This test is not yet approved or cleared by the [place]  FDA and has been authorized for detection and/or diagnosis of SARS-CoV-2 by FDA under an Emergency Use Authorization (EUA). This EUA will remain in effect (meaning this test can be used) for the duration of the COVID-19 declaration under Section 564(b)(1) of the Act, 21 U.S.C. section 360bbb-3(b)(1), unless the authorization is terminated or revoked.  Performed at [facility], [address] [place], [place] [address]   Culture, blood (Routine X 2) w Reflex to ID Panel     Status: Abnormal (Preliminary result)   Collection Time: 11/09/23 10:25 AM   Specimen: BLOOD RIGHT HAND  Result Value Ref Range Status   Specimen Description BLOOD RIGHT HAND  Final   Special Requests   Final    BOTTLES DRAWN AEROBIC AND ANAEROBIC Blood Culture adequate volume   Culture  Setup Time   Final    GRAM POSITIVE COCCI IN CLUSTERS IN BOTH AEROBIC AND ANAEROBIC BOTTLES CRITICAL VALUE NOTED.  VALUE IS CONSISTENT WITH PREVIOUSLY REPORTED AND CALLED VALUE. Performed at [facility], [address], [place], [place] [address]    Culture STAPHYLOCOCCUS AUREUS (A)  Final   Report Status PENDING  Incomplete  Culture, blood (Routine X 2) w Reflex to ID Panel     Status: Abnormal (Preliminary result)   Collection Time: 11/09/23 10:38 AM   Specimen: BLOOD RIGHT ARM  Result Value Ref Range Status   Specimen Description BLOOD RIGHT ARM  Final   Special Requests   Final    BOTTLES DRAWN AEROBIC AND ANAEROBIC Blood Culture adequate volume   Culture  Setup Time   Final    GRAM POSITIVE COCCI IN CLUSTERS IN BOTH AEROBIC AND  ANAEROBIC BOTTLES CRITICAL RESULT CALLED TO, READ BACK BY AND VERIFIED WITH: PHARMD J [facility] 11/10/2023 @ 0159 BY AB    Culture (A)  Final    STAPHYLOCOCCUS AUREUS SUSCEPTIBILITIES TO FOLLOW Performed at [facility], [address] [place], [place] [address]    Report Status PENDING  Incomplete  Blood Culture ID Panel (Reflexed)     Status: Abnormal   Collection Time: 11/09/23 10:38 AM  Result Value Ref Range Status   Enterococcus faecalis NOT DETECTED NOT DETECTED Final   Enterococcus Faecium NOT DETECTED NOT DETECTED Final   Listeria monocytogenes NOT DETECTED NOT DETECTED Final   Staphylococcus species DETECTED (A) NOT DETECTED Final    Comment: CRITICAL RESULT CALLED  TO, READ BACK BY AND VERIFIED WITH: PHARMD J [facility] 11/10/2023 @ 0159 BY AB    Staphylococcus aureus (BCID) DETECTED (A) NOT DETECTED Final    Comment: Methicillin (oxacillin)-resistant Staphylococcus aureus (MRSA). MRSA is predictably resistant to beta-lactam antibiotics (except ceftaroline). Preferred therapy is vancomycin  unless clinically contraindicated. Patient requires contact precautions if  hospitalized. CRITICAL RESULT CALLED TO, READ BACK BY AND VERIFIED WITH: PHARMD J [facility] 11/10/2023 @ 0159 BY AB    Staphylococcus epidermidis NOT DETECTED NOT DETECTED Final   Staphylococcus lugdunensis NOT DETECTED NOT DETECTED Final   Streptococcus species NOT DETECTED NOT DETECTED Final   Streptococcus agalactiae NOT DETECTED NOT DETECTED Final   Streptococcus pneumoniae NOT DETECTED NOT DETECTED Final   Streptococcus pyogenes NOT DETECTED NOT DETECTED Final   A.calcoaceticus-baumannii NOT DETECTED NOT DETECTED Final   Bacteroides fragilis NOT DETECTED NOT DETECTED Final   Enterobacterales NOT DETECTED NOT DETECTED Final   Enterobacter cloacae complex NOT DETECTED NOT DETECTED Final   Escherichia coli NOT DETECTED NOT DETECTED Final   Klebsiella aerogenes NOT DETECTED NOT DETECTED Final   Klebsiella  oxytoca NOT DETECTED NOT DETECTED Final   Klebsiella pneumoniae NOT DETECTED NOT DETECTED Final   Proteus species NOT DETECTED NOT DETECTED Final   Salmonella species NOT DETECTED NOT DETECTED Final   Serratia marcescens NOT DETECTED NOT DETECTED Final   Haemophilus influenzae NOT DETECTED NOT DETECTED Final   Neisseria meningitidis NOT DETECTED NOT DETECTED Final   Pseudomonas aeruginosa NOT DETECTED NOT DETECTED Final   Stenotrophomonas maltophilia NOT DETECTED NOT DETECTED Final   Candida albicans NOT DETECTED NOT DETECTED Final   Candida auris NOT DETECTED NOT DETECTED Final   Candida glabrata NOT DETECTED NOT DETECTED Final   Candida krusei NOT DETECTED NOT DETECTED Final   Candida parapsilosis NOT DETECTED NOT DETECTED Final   Candida tropicalis NOT DETECTED NOT DETECTED Final   Cryptococcus neoformans/gattii NOT DETECTED NOT DETECTED Final   Meth resistant mecA/C and MREJ DETECTED (A) NOT DETECTED Final    Comment: CRITICAL RESULT CALLED TO, READ BACK BY AND VERIFIED WITH: PHARMD J [facility] 11/10/2023 @ 0159 BY AB Performed at [facility], [address], [place], [place] [address]      Radiology Studies: CT CHEST WO CONTRAST Result Date: 11/10/2023 CLINICAL DATA:  Respiratory difficulty EXAM: CT CHEST WITHOUT CONTRAST TECHNIQUE: Multidetector CT imaging of the chest was performed following the standard protocol without IV contrast. RADIATION DOSE REDUCTION: This exam was performed according to the departmental dose-optimization program which includes automated exposure control, adjustment of the mA and/or kV according to patient size and/or use of iterative reconstruction technique. COMPARISON:  Chest x-ray from earlier in the same day. FINDINGS: Cardiovascular: Somewhat limited due to lack of IV contrast. Atherosclerotic calcifications of the aorta are noted. No aneurysmal dilatation is seen. Heart is enlarged in size. Coronary calcifications are noted. Cardiac blood pool is  decreased suggesting underlying anemia. Mediastinum/Nodes: Thoracic inlet is within normal limits. Postsurgical changes in the lower cervical spine are seen. The esophagus is within normal limits. No adenopathy is seen. Lungs/Pleura: Lungs demonstrate diffuse emphysematous changes. No sizable parenchymal nodule is noted. Moderate right-sided effusion is noted with underlying consolidation of the right lower lobe. Upper Abdomen: Scattered nonobstructing renal calculi are noted bilaterally. Musculoskeletal: Fractures involving the right eighth through tenth ribs posteriorly are noted. No left-sided rib fractures are seen. IMPRESSION: Multiple right-sided rib fractures. Associated effusion and lower lobe consolidation on the right is seen. No pneumothorax is noted. Changes suggestive  of anemia. Aortic Atherosclerosis (ICD10-I70.0) and Emphysema (ICD10-J43.9). Electronically Signed   By: Oneil Devonshire M.D.   On: 11/10/2023 00:03   DG CHEST PORT 1 VIEW Result Date: 11/09/2023 CLINICAL DATA:  Altered mental status. EXAM: PORTABLE CHEST 1 VIEW COMPARISON:  November 04, 2023. FINDINGS: Stable cardiomegaly. Mild central pulmonary vascular congestion is noted. Possible mild bilateral pulmonary edema is noted. Bilateral pleural effusions are noted, right greater than left. Bony thorax is unremarkable. IMPRESSION: Stable cardiomegaly with mild central pulmonary vascular congestion and possible mild bilateral pulmonary edema. Pleural effusions as noted above. Electronically Signed   By: Lynwood Landy Raddle M.D.   On: 11/09/2023 11:22     Scheduled Meds:  acetaZOLAMIDE   500 mg Intravenous BID   busPIRone   15 mg Oral BID   Chlorhexidine  Gluconate Cloth  6 each Topical Daily   ferrous sulfate   325 mg Oral Daily   hydrALAZINE   25 mg Oral TID   insulin  aspart  0-9 Units Subcutaneous TID WC   isosorbide  dinitrate  10 mg Oral TID   lidocaine   1 patch Transdermal Q24H   nicotine   21 mg Transdermal Daily   polyethylene glycol   17 g Oral Daily   rosuvastatin   20 mg Oral Daily   sodium chloride  flush  10-40 mL Intracatheter Q12H   sodium chloride  flush  3 mL Intravenous Q12H   venlafaxine  XR  150 mg Oral Daily   Continuous Infusions:  [START ON 11/11/2023] DAPTOmycin        LOS: 6 days    Time spent:    Sigurd Pac, MD [facility]   11/10/2023, 11:22 AM

## 2023-11-11 DIAGNOSIS — B9562 Methicillin resistant Staphylococcus aureus infection as the cause of diseases classified elsewhere: Secondary | ICD-10-CM | POA: Diagnosis not present

## 2023-11-11 DIAGNOSIS — I5021 Acute systolic (congestive) heart failure: Secondary | ICD-10-CM | POA: Diagnosis not present

## 2023-11-11 DIAGNOSIS — T80211A Bloodstream infection due to central venous catheter, initial encounter: Secondary | ICD-10-CM

## 2023-11-11 DIAGNOSIS — R7881 Bacteremia: Secondary | ICD-10-CM | POA: Diagnosis not present

## 2023-11-11 LAB — CBC
HCT: 29.2 % — ABNORMAL LOW (ref 36.0–46.0)
Hemoglobin: 8.8 g/dL — ABNORMAL LOW (ref 12.0–15.0)
MCH: 25.8 pg — ABNORMAL LOW (ref 26.0–34.0)
MCHC: 30.1 g/dL (ref 30.0–36.0)
MCV: 85.6 fL (ref 80.0–100.0)
Platelets: 283 K/uL (ref 150–400)
RBC: 3.41 MIL/uL — ABNORMAL LOW (ref 3.87–5.11)
RDW: 17.8 % — ABNORMAL HIGH (ref 11.5–15.5)
WBC: 14.9 K/uL — ABNORMAL HIGH (ref 4.0–10.5)
nRBC: 0 % (ref 0.0–0.2)

## 2023-11-11 LAB — GLUCOSE, CAPILLARY
Glucose-Capillary: 133 mg/dL — ABNORMAL HIGH (ref 70–99)
Glucose-Capillary: 137 mg/dL — ABNORMAL HIGH (ref 70–99)
Glucose-Capillary: 119 mg/dL — ABNORMAL HIGH (ref 70–99)
Glucose-Capillary: 168 mg/dL — ABNORMAL HIGH (ref 70–99)

## 2023-11-11 LAB — BASIC METABOLIC PANEL WITH GFR
Anion gap: 16 — ABNORMAL HIGH (ref 5–15)
BUN: 76 mg/dL — ABNORMAL HIGH (ref 8–23)
CO2: 32 mmol/L (ref 22–32)
Calcium: 9 mg/dL (ref 8.9–10.3)
Chloride: 85 mmol/L — ABNORMAL LOW (ref 98–111)
Creatinine, Ser: 2.22 mg/dL — ABNORMAL HIGH (ref 0.44–1.00)
GFR, Estimated: 23 mL/min — ABNORMAL LOW (ref >60)
Glucose, Bld: 105 mg/dL — ABNORMAL HIGH (ref 70–99)
Potassium: 4.4 mmol/L (ref 3.5–5.1)
Sodium: 133 mmol/L — ABNORMAL LOW (ref 135–145)

## 2023-11-11 LAB — CULTURE, BLOOD (ROUTINE X 2): Special Requests: ADEQUATE

## 2023-11-11 LAB — APTT: aPTT: 82 s — ABNORMAL HIGH (ref 24–36)

## 2023-11-11 LAB — HEPARIN LEVEL (UNFRACTIONATED): Heparin Unfractionated: >1.1 [IU]/mL — ABNORMAL HIGH (ref 0.30–0.70)

## 2023-11-11 LAB — CK: Total CK: 120 U/L (ref 38–234)

## 2023-11-11 MED ORDER — GLUCERNA SHAKE PO LIQD
237.0000 mL | Freq: Three times a day (TID) | ORAL | Status: DC
  Administered 2023-11-13: 237 mL via ORAL

## 2023-11-11 MED ORDER — APIXABAN 5 MG PO TABS
5.0000 mg | ORAL_TABLET | Freq: Two times a day (BID) | ORAL | Status: DC
  Administered 2023-11-11 – 2023-11-13 (×5): 5 mg via ORAL
  Filled 2023-11-11 (×6): qty 1

## 2023-11-11 NOTE — Progress Notes (Signed)
 PROGRESS NOTE    Maria Arroyo  FMW:[id number] DOB: 1950-05-03 DOA: 11/04/2023 PCP: Aminta Lamar Hamilton, MD  72/F with history of chronic back pain, paroxysmal A-fib, hypertension, chronic diastolic CHF, COPD, chronic respiratory failure on 3 L home O2, lung cancer with right lobectomy in 2020, type 2 diabetes, depression, tobacco use presented to the ED with worsening dyspnea and CHF. - Diagnosed with new onset CHF, troponin 1139, EF down to 30-35% with BiV failure, cardiology following, briefly on milrinone , subsequently discontinued 8/12 -8/11 onwards became lethargic with leukocytosis, - 11/09/11 early a.m. blood cultures growing MRSA> started vancomycin  - 8/13, ID consult Vanc changed to daptomycin , PICC line removed 8   Subjective: -More awake today, some confusion  Assessment and Plan:  MRSA bacteremia Sepsis, not POA - Developed low-grade fever, leukocytosis and encephalopathy over the last 48 hours, blood cultures now growing MRSA,, appreciate ID input, vancomycin  changed to daptomycin  - PICC line removed 8/13 - Anticipate need for TEE and MRI of her spine over the next day or 2 when mental status is more appropriate - Follow-up repeat blood cultures  Encephalopathy - Suspect primarily secondary to sepsis and MRSA bacteremia - Also decreased BuSpar  and Effexor  dose  Acute on chronic systolic and diastolic CHF New biventricular failure -Echo this admission noted EF down to 30-35%, moderately reduced RV, severely elevated dilated BAE - Concern for ischemic cardiomyopathy, troponin peaked at 1139, prior history of LAD stenting -Was on IV milrinone  for approximately 48 hours, now off - Deferring ischemic eval at this time in the setting of AKI, encephalopathy etc., cards following - Diuretics on hold at this time  NSTEMI - Troponin peaked at 1139, then trending down - Conservative management at this time in the setting of sepsis, encephalopathy, AKI etc.  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  LFTs showing improvement this morning.  Will recheck in AM.   Diabetes mellitus - CBGs improving, continue Semglee    Iron  deficiency anemia - No overt bleeding.  Monitor   Paroxysmal atrial fibrillation - Now off IV amiodarone , IV heparin    History of squamous cell carcinoma of the right lung - S/p right lobectomy in 2020.  COPD/chronic respiratory failure On 3 L home O2 at baseline   Tobacco abuse - Still smokes.Nicotine  patch was offered.   Obesity class I - BMI nearly 32.  Encouraged lifestyle modification and weight loss.   DVT prophylaxis: IV heparin  Code Status: DNR, discussed with dtr at bedside Family Communication: Daughter at bedside Disposition Plan: TBD  Objective: Vitals:   11/11/23 0035 11/11/23 0507 11/11/23 0740 11/11/23 1111  BP: 130/73 138/75 120/72 120/62  Pulse: 68 77 79 75  Resp: 17 18 18  (!) 24  Temp: 97.7 F (36.5 C) 98.3 F (36.8 C) (!) 96.4 F (35.8 C) (!) 97.4 F (36.3 C)  TempSrc: Oral Oral Axillary Oral  SpO2: 97% 100% 100% 99%  Weight:  63 kg    Height:        Intake/Output Summary (Last 24 hours) at 11/11/2023 1248 Last data filed at 11/11/2023 0900 Gross per 24 hour  Intake 263 ml  Output 250 ml  Net 13 ml   Filed Weights   11/09/23 0433 11/10/23 0425 11/11/23 0507  Weight: 64.1 kg 62.9 kg 63 kg    Examination:  General exam: Obtunded, somnolent responds to painful stimuli Respiratory system: Poor air movement bilaterally Cardiovascular system: S1 & S2 heard, regular Abd: nondistended, soft and nontender.Normal bowel sounds heard. Central nervous system: Obtunded,  somnolent Extremities: Trace edema, Unna boots on, PICC line Skin: No rashes Psychiatry: Unable to assess    Data Reviewed:   CBC: Recent Labs  Lab 11/07/23 0535 11/08/23 0556 11/09/23 0211 11/10/23 0442 11/11/23 0836  WBC 9.0 14.1* 19.6* 15.9* 14.9*  HGB 8.9* 9.6* 8.9* 9.1* 8.8*  HCT 29.4* 31.5* 29.7* 29.9* 29.2*  MCV 85.7 84.5 85.8 85.9 85.6  PLT 264 290 276 273 283   Basic Metabolic Panel: Recent Labs  Lab 11/06/23 0543 11/06/23 1534 11/07/23 0535 11/08/23 0556 11/09/23 0211 11/10/23 0442 11/11/23 0836  NA 134*   < > 134* 135 135 134* 133*  K 2.7*   < > 3.7 3.3* 3.1* 5.2* 4.4  CL 80*   < > 79* 76* 82* 87* 85*  CO2 41*   < > 43* 44* 39* 32 32  GLUCOSE 147*   < > 122* 157* 149* 222* 105*  BUN 59*   < > 54* 51* 50* 62* 76*  CREATININE 2.18*   < > 2.18* 2.08* 1.91* 2.21* 2.22*  CALCIUM  8.8*   < > 8.7* 9.0 9.0 8.9 9.0  MG 2.3  --  2.0 2.0 2.2 2.2  --   PHOS  --   --   --   --   --  4.8*  --    < > = values in this interval not displayed.   GFR: Estimated Creatinine Clearance: 18.2 mL/min (A) (by C-G formula based on SCr of 2.22 mg/dL (H)). Liver Function Tests: Recent Labs  Lab 11/06/23 0543 11/07/23 0535 11/08/23 0556 11/09/23 0211 11/10/23 0442  AST 108* 59* 37 35 161*  ALT 236* 167* 112* 78* 137*  ALKPHOS 172* 151* 124 110 145*  BILITOT 0.5 0.8 0.4 0.6 0.9  PROT 6.0* 5.9* 6.3* 6.0* 6.3*  ALBUMIN 2.6* 2.5* 2.4* 2.3* 2.2*   No results for input(s): LIPASE, AMYLASE in the last 168 hours. Recent Labs  Lab 11/08/23 1130  AMMONIA 41*   Coagulation Profile: No results for input(s): INR, PROTIME in the last 168 hours. Cardiac Enzymes: Recent Labs  Lab 11/11/23 0836  CKTOTAL 120   BNP (last 3 results) No results for input(s): PROBNP in the last 8760 hours. HbA1C: No results for input(s): HGBA1C in the last 72 hours. CBG: Recent Labs  Lab 11/10/23 1138 11/10/23 1609 11/10/23 2053 11/11/23 0616 11/11/23 1202  GLUCAP 204* 150*  219* 133* 137*   Lipid Profile: No results for input(s): CHOL, HDL, LDLCALC, TRIG, CHOLHDL, LDLDIRECT in the last 72 hours. Thyroid Function Tests: No results for input(s): TSH, T4TOTAL, FREET4, T3FREE, THYROIDAB in the last 72 hours. Anemia Panel: No results for input(s): VITAMINB12, FOLATE, FERRITIN, TIBC, IRON , RETICCTPCT in the last 72 hours. Urine analysis:    Component Value Date/Time   COLORURINE YELLOW 11/09/2023 1155   APPEARANCEUR HAZY (A) 11/09/2023 1155   LABSPEC 1.015 11/09/2023 1155   PHURINE 8.0 11/09/2023 1155   GLUCOSEU NEGATIVE 11/09/2023 1155   HGBUR NEGATIVE 11/09/2023 1155   BILIRUBINUR NEGATIVE 11/09/2023 1155   KETONESUR NEGATIVE 11/09/2023 1155   PROTEINUR 30 (A) 11/09/2023 1155   NITRITE POSITIVE (A) 11/09/2023 1155   LEUKOCYTESUR NEGATIVE 11/09/2023 1155   Sepsis Labs: @LABRCNTIP (procalcitonin:4,lacticidven:4)  ) Recent Results (from the  past 240 hours)  Resp panel by RT-PCR (RSV, Flu A&B, Covid) Anterior Nasal Swab     Status: None   Collection Time: 11/09/23 10:19 AM   Specimen: Anterior Nasal Swab  Result Value Ref Range Status   SARS Coronavirus 2 by RT PCR NEGATIVE NEGATIVE Final   Influenza A by PCR NEGATIVE NEGATIVE Final   Influenza B by PCR NEGATIVE NEGATIVE Final    Comment: (NOTE) The Xpert Xpress SARS-CoV-2/FLU/RSV plus assay is intended as an aid in the diagnosis of influenza from Nasopharyngeal swab specimens and should not be used as a sole basis for treatment. Nasal washings and aspirates are unacceptable for Xpert Xpress SARS-CoV-2/FLU/RSV testing.  Fact Sheet for Patients: BloggerCourse.com  Fact Sheet for Healthcare Providers: SeriousBroker.it  This test is not yet approved or cleared by the [place]  FDA and has been authorized for detection and/or diagnosis of SARS-CoV-2 by FDA under an Emergency Use Authorization (EUA). This EUA will  remain in effect (meaning this test can be used) for the duration of the COVID-19 declaration under Section 564(b)(1) of the Act, 21 U.S.C. section 360bbb-3(b)(1), unless the authorization is terminated or revoked.     Resp Syncytial Virus by PCR NEGATIVE NEGATIVE Final    Comment: (NOTE) Fact Sheet for Patients: BloggerCourse.com  Fact Sheet for Healthcare Providers: SeriousBroker.it  This test is not yet approved or cleared by the [place]  FDA and has been authorized for detection and/or diagnosis of SARS-CoV-2 by FDA under an Emergency Use Authorization (EUA). This EUA will remain in effect (meaning this test can be used) for the duration of the COVID-19 declaration under Section 564(b)(1) of the Act, 21 U.S.C. section 360bbb-3(b)(1), unless the authorization is terminated or revoked.  Performed at [facility], [address] [place], [place] [address]   Culture, blood (Routine X 2) w Reflex to ID Panel     Status: Abnormal   Collection Time: 11/09/23 10:25 AM   Specimen: BLOOD RIGHT HAND  Result Value Ref Range Status   Specimen Description BLOOD RIGHT HAND  Final   Special Requests   Final    BOTTLES DRAWN AEROBIC AND ANAEROBIC Blood Culture adequate volume   Culture  Setup Time   Final    GRAM POSITIVE COCCI IN CLUSTERS IN BOTH AEROBIC AND ANAEROBIC BOTTLES CRITICAL VALUE NOTED.  VALUE IS CONSISTENT WITH PREVIOUSLY REPORTED AND CALLED VALUE.    Culture (A)  Final    STAPHYLOCOCCUS AUREUS SUSCEPTIBILITIES PERFORMED ON PREVIOUS CULTURE WITHIN THE LAST 5 DAYS. Performed at [facility], [address] [place], [place] [address]    Report Status 11/11/2023 FINAL  Final  Culture, blood (Routine X 2) w Reflex to ID Panel     Status: Abnormal (Preliminary result)   Collection Time: 11/09/23 10:38 AM   Specimen: BLOOD RIGHT ARM  Result Value Ref Range Status   Specimen Description BLOOD RIGHT ARM  Final    Special Requests   Final    BOTTLES DRAWN AEROBIC AND ANAEROBIC Blood Culture adequate volume   Culture  Setup Time   Final    GRAM POSITIVE COCCI IN CLUSTERS IN BOTH AEROBIC AND ANAEROBIC BOTTLES CRITICAL RESULT CALLED TO, READ BACK BY AND VERIFIED WITH: PHARMD J [facility] 11/10/2023 @ 0159 BY AB    Culture (A)  Final    METHICILLIN RESISTANT STAPHYLOCOCCUS AUREUS Sent to [facility] for further susceptibility testing. Performed at [facility], [address] [place], [place] [address]    Report  Status PENDING  Incomplete   Organism ID, Bacteria METHICILLIN RESISTANT STAPHYLOCOCCUS AUREUS  Final      Susceptibility   Methicillin resistant staphylococcus aureus - MIC*    CIPROFLOXACIN >=8 RESISTANT Resistant     ERYTHROMYCIN >=8 RESISTANT Resistant     GENTAMICIN <=0.5 SENSITIVE Sensitive     OXACILLIN >=4 RESISTANT Resistant     TETRACYCLINE <=1 SENSITIVE Sensitive     VANCOMYCIN  1 SENSITIVE Sensitive     TRIMETH/SULFA <=10 SENSITIVE Sensitive     CLINDAMYCIN <=0.25 SENSITIVE Sensitive     RIFAMPIN <=0.5 SENSITIVE Sensitive     Inducible Clindamycin NEGATIVE Sensitive     LINEZOLID 2 SENSITIVE Sensitive     * METHICILLIN RESISTANT STAPHYLOCOCCUS AUREUS  Blood Culture ID Panel (Reflexed)     Status: Abnormal   Collection Time: 11/09/23 10:38 AM  Result Value Ref Range Status   Enterococcus faecalis NOT DETECTED NOT DETECTED Final   Enterococcus Faecium NOT DETECTED NOT DETECTED Final   Listeria monocytogenes NOT DETECTED NOT DETECTED Final   Staphylococcus species DETECTED (A) NOT DETECTED Final    Comment: CRITICAL RESULT CALLED TO, READ BACK BY AND VERIFIED WITH: PHARMD J [facility] 11/10/2023 @ 0159 BY AB    Staphylococcus aureus (BCID) DETECTED (A) NOT DETECTED Final    Comment: Methicillin (oxacillin)-resistant Staphylococcus aureus (MRSA). MRSA is predictably resistant to beta-lactam antibiotics (except ceftaroline). Preferred therapy is vancomycin  unless clinically  contraindicated. Patient requires contact precautions if  hospitalized. CRITICAL RESULT CALLED TO, READ BACK BY AND VERIFIED WITH: PHARMD J [facility] 11/10/2023 @ 0159 BY AB    Staphylococcus epidermidis NOT DETECTED NOT DETECTED Final   Staphylococcus lugdunensis NOT DETECTED NOT DETECTED Final   Streptococcus species NOT DETECTED NOT DETECTED Final   Streptococcus agalactiae NOT DETECTED NOT DETECTED Final   Streptococcus pneumoniae NOT DETECTED NOT DETECTED Final   Streptococcus pyogenes NOT DETECTED NOT DETECTED Final   A.calcoaceticus-baumannii NOT DETECTED NOT DETECTED Final   Bacteroides fragilis NOT DETECTED NOT DETECTED Final   Enterobacterales NOT DETECTED NOT DETECTED Final   Enterobacter cloacae complex NOT DETECTED NOT DETECTED Final   Escherichia coli NOT DETECTED NOT DETECTED Final   Klebsiella aerogenes NOT DETECTED NOT DETECTED Final   Klebsiella oxytoca NOT DETECTED NOT DETECTED Final   Klebsiella pneumoniae NOT DETECTED NOT DETECTED Final   Proteus species NOT DETECTED NOT DETECTED Final   Salmonella species NOT DETECTED NOT DETECTED Final   Serratia marcescens NOT DETECTED NOT DETECTED Final   Haemophilus influenzae NOT DETECTED NOT DETECTED Final   Neisseria meningitidis NOT DETECTED NOT DETECTED Final   Pseudomonas aeruginosa NOT DETECTED NOT DETECTED Final   Stenotrophomonas maltophilia NOT DETECTED NOT DETECTED Final   Candida albicans NOT DETECTED NOT DETECTED Final   Candida auris NOT DETECTED NOT DETECTED Final   Candida glabrata NOT DETECTED NOT DETECTED Final   Candida krusei NOT DETECTED NOT DETECTED Final   Candida parapsilosis NOT DETECTED NOT DETECTED Final   Candida tropicalis NOT DETECTED NOT DETECTED Final   Cryptococcus neoformans/gattii NOT DETECTED NOT DETECTED Final   Meth resistant mecA/C and MREJ DETECTED (A) NOT DETECTED Final    Comment: CRITICAL RESULT CALLED TO, READ BACK BY AND VERIFIED WITH: PHARMD J [facility] 11/10/2023 @ 0159 BY  AB Performed at [facility], [address], [place], [place] [address]      Radiology Studies: CT CHEST WO CONTRAST Result Date: 11/10/2023 CLINICAL DATA:  Respiratory difficulty EXAM: CT CHEST WITHOUT CONTRAST TECHNIQUE: Multidetector CT imaging of the  chest was performed following the standard protocol without IV contrast. RADIATION DOSE REDUCTION: This exam was performed according to the departmental dose-optimization program which includes automated exposure control, adjustment of the mA and/or kV according to patient size and/or use of iterative reconstruction technique. COMPARISON:  Chest x-ray from earlier in the same day. FINDINGS: Cardiovascular: Somewhat limited due to lack of IV contrast. Atherosclerotic calcifications of the aorta are noted. No aneurysmal dilatation is seen. Heart is enlarged in size. Coronary calcifications are noted. Cardiac blood pool is decreased suggesting underlying anemia. Mediastinum/Nodes: Thoracic inlet is within normal limits. Postsurgical changes in the lower cervical spine are seen. The esophagus is within normal limits. No adenopathy is seen. Lungs/Pleura: Lungs demonstrate diffuse emphysematous changes. No sizable parenchymal nodule is noted. Moderate right-sided effusion is noted with underlying consolidation of the right lower lobe. Upper Abdomen: Scattered nonobstructing renal calculi are noted bilaterally. Musculoskeletal: Fractures involving the right eighth through tenth ribs posteriorly are noted. No left-sided rib fractures are seen. IMPRESSION: Multiple right-sided rib fractures. Associated effusion and lower lobe consolidation on the right is seen. No pneumothorax is noted. Changes suggestive of anemia. Aortic Atherosclerosis (ICD10-I70.0) and Emphysema (ICD10-J43.9). Electronically Signed   By: Oneil Devonshire M.D.   On: 11/10/2023 00:03     Scheduled Meds:  amiodarone   200 mg Oral Daily   apixaban   5 mg Oral BID   busPIRone   7.5 mg Oral BID    Chlorhexidine  Gluconate Cloth  6 each Topical Daily   ferrous sulfate   325 mg Oral Daily   hydrALAZINE   25 mg Oral TID   insulin  aspart  0-9 Units Subcutaneous TID WC   isosorbide  dinitrate  10 mg Oral TID   lidocaine   1 patch Transdermal Q24H   nicotine   21 mg Transdermal Daily   polyethylene glycol  17 g Oral Daily   rosuvastatin   20 mg Oral Daily   sodium chloride  flush  10-40 mL Intracatheter Q12H   sodium chloride  flush  3 mL Intravenous Q12H   venlafaxine  XR  75 mg Oral Daily   Continuous Infusions:  DAPTOmycin        LOS: 7 days    Time spent:    Sigurd Pac, MD [facility]   11/11/2023, 12:48 PM

## 2023-11-11 NOTE — Progress Notes (Signed)
 PHARMACY - ANTICOAGULATION CONSULT NOTE  Pharmacy Consult for heparin  Indication: Afib   Allergies  Allergen Reactions   Ace Inhibitors Other (See Comments)    Worsening renal function    Codeine Nausea Only    Patient Measurements: Height: 4' 11 (149.9 cm) Weight: 63 kg (138 lb 14.2 oz) IBW/kg (Calculated) : 43.2 HEPARIN  DW (KG): 59  Vital Signs: Temp: 96.4 F (35.8 C) (08/14 0740) Temp Source: Axillary (08/14 0740) BP: 120/72 (08/14 0740) Pulse Rate: 79 (08/14 0740)  Labs: Recent Labs    11/09/23 0211 11/10/23 0442 11/10/23 1946 11/11/23 0836  HGB 8.9* 9.1*  --  8.8*  HCT 29.7* 29.9*  --  29.2*  PLT 276 273  --  283  APTT  --   --  84* 82*  HEPARINUNFRC  --   --   --  >1.10*  CREATININE 1.91* 2.21*  --  2.22*  CKTOTAL  --   --   --  120    Estimated Creatinine Clearance: 18.2 mL/min (A) (by C-G formula based on SCr of 2.22 mg/dL (H)).   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   11/03/2023 Morning   albuterol  (VENTOLIN  HFA) 108 (90 Base) MCG/ACT inhaler Inhale 2 puffs into the lungs every 6 (six) hours as needed for shortness of breath or wheezing.   11/03/2023 Noon   amiodarone  (PACERONE ) 200 MG tablet Take 1 tablet (200 mg total) by mouth daily. 90 tablet 3 11/03/2023 Morning   apixaban  (ELIQUIS ) 5 MG TABS tablet Take 1 tablet (5 mg total) by mouth 2 (two) times daily. 180  tablet 3 11/03/2023 at  8:30 PM   Ascorbic Acid (VITAMIN C PO) Take 1 tablet by mouth daily.   11/03/2023 Morning   busPIRone  (BUSPAR ) 15 MG tablet Take 15 mg by mouth 2 (two) times daily.   11/03/2023 Bedtime   Cholecalciferol  (VITAMIN D) 2000 UNITS tablet Take 2,000 Units by mouth daily.   11/03/2023 Morning   gabapentin  (NEURONTIN ) 600 MG tablet Take 600 mg by mouth in the morning, at noon, in the evening, and at bedtime.   11/03/2023 Evening   hydrALAZINE  (APRESOLINE ) 25 MG tablet Take 25 mg by mouth 3 (three) times daily.   11/03/2023 at  9:30 PM   Iron , Ferrous Sulfate , 325 (65 Fe) MG TABS Take 1 tablet by mouth daily. 30 tablet 3 11/03/2023 Morning   isosorbide  dinitrate (ISORDIL ) 10 MG tablet Take 1 tablet (10 mg total) by mouth 3 (three) times daily. 90 tablet 1 11/03/2023 at  9:30 PM   Misc. Devices MISC Inhale 3 L into the lungs continuous.   11/03/2023   Multiple Vitamin (MULTIVITAMIN WITH MINERALS) TABS tablet Take 1 tablet by mouth daily. 30 tablet 0 11/03/2023 Morning   nicotine  (NICODERM  CQ - DOSED IN MG/24 HOURS) 21 mg/24hr patch Place 1 patch (21 mg total) onto the skin daily. 28 patch 0 11/03/2023 Morning   nitroGLYCERIN  (NITROSTAT ) 0.4 MG SL tablet PLACE 1 TABLET UNDER THE TONGUE EVERY 5 MINUTES AS NEEDED CHEST PAIN. 25 tablet 1 Unknown   potassium chloride  SA (KLOR-CON  M) 20 MEQ tablet Take 20 mEq by mouth daily.   11/03/2023 Morning   rosuvastatin  (CRESTOR ) 40 MG tablet TAKE 1 TABLET BY MOUTH DAILY 90 tablet 3 11/03/2023 Morning   torsemide  (DEMADEX ) 20 MG tablet Take 1 tablet (20 mg total) by mouth daily.   11/03/2023 Morning   venlafaxine  XR (EFFEXOR -XR) 150 MG 24 hr capsule Take 150 mg by mouth daily.  0 11/03/2023 Morning   Assessment: 73y.o. with history of Afib on Eliquis  PTA CHF exacerbation.  Pharmacy consulted for heparin  dosing. Hgb and plts stable. No reported signs or symptoms of bleeding per MD and nurse. Patient initially started on heparin  on admission and apixaban  held > restarted  apixaban  8/11 > last dose 8/12pm - hold apixaban  with confusion - will resume heparin  drip for now  Will use aptt to dose heparin  as apixaban  falsely elevates heparin  levels  8/14 AM aPTT 82 (in range) Heparin  level still elevated by apixaban .  Goal of Therapy:  Heparin  level 0.3-0.7 units/ml aPTT 66-102 seconds Monitor platelets by anticoagulation protocol: Yes   Plan:  Continue heparin  infusion at 1250 units/hr Daily CBC, heparin  level, aPTT until levels are correlating F/u intervention plans, ability to switch back to DOAC Monitor H&H, plts, signs and symptoms of bleeding   Larraine Brazier, PharmD Clinical Pharmacist 11/11/2023  10:01 AM **Pharmacist phone directory can now be found on [email or web address] ([email or web address]).  Listed under [facility].

## 2023-11-11 NOTE — Progress Notes (Signed)
   [facility] has been requested to perform a transesophageal echocardiogram on Maria Arroyo for bacteremia.    The patient does NOT have any absolute or relative contraindications to a Transesophageal Echocardiogram (TEE).  The patient has: Current Oxygen Requirement and History of Reduced Ejection Fraction    After careful review of history and examination, the risks and benefits of transesophageal echocardiogram have been explained including risks of esophageal damage, perforation (1:10,000 risk), bleeding, pharyngeal hematoma as well as other potential complications associated with conscious sedation including aspiration, arrhythmia, respiratory failure and death. I have discussed this with the patient, as well as her daughter who was present in the room. Alternatives to treatment were discussed, questions were answered. Patient and her daughter are willing to proceed.   Signed, Waddell DELENA Donath, PA-C  11/11/2023 4:06 PM

## 2023-11-11 NOTE — Progress Notes (Signed)
 Progress Note  Patient Name: Maria Arroyo Date of Encounter: 11/11/2023  Primary Cardiologist:   Peter Swazil[place], MD   Subjective   She is having rib pain.  More awake.  Denies SOB.    Inpatient Medications    Scheduled Meds:  acetaZOLAMIDE   500 mg Intravenous BID   amiodarone   200 mg Oral Daily   busPIRone   7.5 mg Oral BID   Chlorhexidine  Gluconate Cloth  6 each Topical Daily   ferrous sulfate   325 mg Oral Daily   hydrALAZINE   25 mg Oral TID   insulin  aspart  0-9 Units Subcutaneous TID WC   isosorbide  dinitrate  10 mg Oral TID   lidocaine   1 patch Transdermal Q24H   nicotine   21 mg Transdermal Daily   polyethylene glycol  17 g Oral Daily   rosuvastatin   20 mg Oral Daily   sodium chloride  flush  10-40 mL Intracatheter Q12H   sodium chloride  flush  3 mL Intravenous Q12H   venlafaxine  XR  75 mg Oral Daily   Continuous Infusions:  DAPTOmycin      heparin  1,250 Units/hr (11/10/23 1433)   PRN Meds: acetaminophen  **OR** acetaminophen , bisacodyl , bisacodyl , ipratropium-albuterol , melatonin, mouth rinse, sodium chloride  flush   Vital Signs    Vitals:   11/10/23 2117 11/11/23 0035 11/11/23 0507 11/11/23 0740  BP: 124/69 130/73 138/75 120/72  Pulse:  68 77 79  Resp: (!) 24 17 18 18   Temp:  97.7 F (36.5 C) 98.3 F (36.8 C) (!) 96.4 F (35.8 C)  TempSrc:  Oral Oral Axillary  SpO2: 99% 97% 100% 100%  Weight:   63 kg   Height:        Intake/Output Summary (Last 24 hours) at 11/11/2023 0843 Last data filed at 11/11/2023 0035 Gross per 24 hour  Intake 465.44 ml  Output 250 ml  Net 215.44 ml   Filed Weights   11/09/23 0433 11/10/23 0425 11/11/23 0507  Weight: 64.1 kg 62.9 kg 63 kg    Telemetry    NSR, run of wide complex tach likely SVT with RBBB - Personally Reviewed  ECG    NA - Personally Reviewed  Physical Exam   GEN: No  acute distress.   Neck: No  JVD Cardiac: RRR, no murmurs, rubs, or gallops.  Respiratory:      Decreased breath  dependent. No wheezing GI: Soft, nontender, non-distended, normal bowel sounds  MS:  Mild ankle edema; No deformity. Neuro:   Nonfocal  Psych: Oriented and appropriate     Labs    Chemistry Recent Labs  Lab 11/08/23 0556 11/09/23 0211 11/10/23 0442  NA 135 135 134*  K 3.3* 3.1* 5.2*  CL 76* 82* 87*  CO2 44* 39* 32  GLUCOSE 157* 149* 222*  BUN 51* 50* 62*  CREATININE 2.08* 1.91* 2.21*  CALCIUM  9.0 9.0 8.9  PROT 6.3* 6.0* 6.3*  ALBUMIN 2.4* 2.3* 2.2*  AST 37 35 161*  ALT 112* 78* 137*  ALKPHOS 124 110 145*  BILITOT 0.4 0.6 0.9  GFRNONAA 25* 28* 23*  ANIONGAP 15 14 15      Hematology Recent Labs  Lab 11/08/23 0556 11/09/23 0211 11/10/23 0442  WBC 14.1* 19.6* 15.9*  RBC 3.73* 3.46* 3.48*  HGB 9.6* 8.9* 9.1*  HCT 31.5* 29.7* 29.9*  MCV 84.5 85.8 85.9  MCH 25.7* 25.7* 26.1  MCHC 30.5 30.0 30.4  RDW 17.6* 17.9* 17.9*  PLT 290 276 273    Cardiac EnzymesNo results for input(s): TROPONINI in the  last 168 hours. No results for input(s): TROPIPOC in the last 168 hours.   BNP No results for input(s): BNP, PROBNP in the last 168 hours.    DDimer No results for input(s): DDIMER in the last 168 hours.   Radiology    CT CHEST WO CONTRAST Result Date: 11/10/2023 CLINICAL DATA:  Respiratory difficulty EXAM: CT CHEST WITHOUT CONTRAST TECHNIQUE: Multidetector CT imaging of the chest was performed following the standard protocol without IV contrast. RADIATION DOSE REDUCTION: This exam was performed according to the departmental dose-optimization program which includes automated exposure control, adjustment of the mA and/or kV according to patient size and/or use of iterative reconstruction technique. COMPARISON:  Chest x-ray from earlier in the same day. FINDINGS: Cardiovascular: Somewhat limited due to lack of IV contrast. Atherosclerotic calcifications of the aorta are noted. No aneurysmal dilatation is seen. Heart is enlarged in size. Coronary calcifications are  noted. Cardiac blood pool is decreased suggesting underlying anemia. Mediastinum/Nodes: Thoracic inlet is within normal limits. Postsurgical changes in the lower cervical spine are seen. The esophagus is within normal limits. No adenopathy is seen. Lungs/Pleura: Lungs demonstrate diffuse emphysematous changes. No sizable parenchymal nodule is noted. Moderate right-sided effusion is noted with underlying consolidation of the right lower lobe. Upper Abdomen: Scattered nonobstructing renal calculi are noted bilaterally. Musculoskeletal: Fractures involving the right eighth through tenth ribs posteriorly are noted. No left-sided rib fractures are seen. IMPRESSION: Multiple right-sided rib fractures. Associated effusion and lower lobe consolidation on the right is seen. No pneumothorax is noted. Changes suggestive of anemia. Aortic Atherosclerosis (ICD10-I70.0) and Emphysema (ICD10-J43.9). Electronically Signed   By: Oneil Devonshire M.D.   On: 11/10/2023 00:03   DG CHEST PORT 1 VIEW Result Date: 11/09/2023 CLINICAL DATA:  Altered mental status. EXAM: PORTABLE CHEST 1 VIEW COMPARISON:  November 04, 2023. FINDINGS: Stable cardiomegaly. Mild central pulmonary vascular congestion is noted. Possible mild bilateral pulmonary edema is noted. Bilateral pleural effusions are noted, right greater than left. Bony thorax is unremarkable. IMPRESSION: Stable cardiomegaly with mild central pulmonary vascular congestion and possible mild bilateral pulmonary edema. Pleural effusions as noted above. Electronically Signed   By: Lynwood Landy Raddle M.D.   On: 11/09/2023 11:22     Cardiac Studies   Echo 11/04/23   1. Left ventricular ejection fraction, by estimation, is 30 to 35%. The  left ventricle has moderately decreased function. Left ventricular  endocardial border not optimally defined to evaluate regional wall motion.  The left ventricular internal cavity  size was mildly dilated. Left ventricular diastolic parameters are   consistent with Grade II diastolic dysfunction (pseudonormalization).  Elevated left atrial pressure.   2. Right ventricular systolic function is moderately reduced. The right  ventricular size is moderately enlarged.   3. Left atrial size was severely dilated.   4. Right atrial size was severely dilated.   5. The mitral valve is degenerative. Mild to moderate mitral valve  regurgitation. No evidence of mitral stenosis. Moderate mitral annular  calcification.   6. Tricuspid valve regurgitation is moderate.   7. The aortic valve is calcified. There is moderate calcification of the  aortic valve. There is moderate thickening of the aortic valve. Aortic  valve regurgitation is not visualized. Aortic valve  sclerosis/calcification is present, without any evidence  of aortic stenosis.   8. The inferior vena cava is normal in size with greater than 50%  respiratory variability, suggesting right atrial pressure of 3 mmHg.   9. Very poor acoustical windows limit ability to  detect focal wall motion  abnormalities. The estimated EF may be underestimated due to poor images  even with definity  contrast.   Patient Profile     73 y.o. female with CAD, HTN, HLD, PAF, chronic diastolic heart failure, chronic respiratory failure, DM II, Squamous cell carcinoma s/p R lobectomy 20', tobacco abuse and depression. AHF team to see for acute combined systolic and diastolic heart failure and suspected low output HF.   Assessment & Plan    Acute combined systolic and diastolic heart failure:   Possible ischemic etiology.  Note the EF is 30 - 35% as above.  EF had appeared to be 55% in 2020.  Holding on cath with increased creatinine.   Milrinone  stopped two days ago.    CoOx yesterday 71.1.   PICC removed.    Net negative 10.6.  Holding diuretic with increased creat.  Might resume in AM.   Hypokalemia:   Potassium is normalized. Follow.   CAD:    Medical management.   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  Still  holding diuretic as above.   Paroxysmal A-fib:     Had transient atrial fib.  Switched back to PO amio yesterday.   Change back to DOAC.    COPD/history of lobectomy for lung cancer:  Management of chronic lung disease per primary team.     DM/HL :  Per primary team.    Confusion:    Improved somnolence.  Now agitated with pain.  I have contacted the primary service to see if we can resume pain med.   Hypercapnea:   Improved.  Stop Diamox .   Anemia:   Hemoglobin  relatively stable.  No active bleeding.  Follow.   Leukocytosis: Antibiotics per primary team and evaluation as above.   WBC is slowly coming down.   .   For questions or updates, please contact [facility] Please consult [email or web address] for contact info under Cardiology/STEMI.   Signed, Lynwood Schilling, MD  11/11/2023, 8:43 AM

## 2023-11-11 NOTE — Progress Notes (Signed)
 Occupational Therapy Treatment Patient Details Name: Maria Arroyo MRN: [id number] DOB: 1950-07-04 Today's Date: 11/11/2023   History of present illness Patient is a 73 yo female presenting to the ED with SOB on 11/04/23. Admitted with acute exacerbation of HFrEF and also noted NSTEMI with CAD and elevated troponin. Metabolic aklalosis, hypokalemia and transaminitis also noted. PMH includes chronic hypoxic respiratory failure on 3 L nasal cannula at home, COPD, paroxysmal A-fib, chronic diastolic CHF, hypertension, hyperlipidemia, controlled type 2 diabetes, stage IIIB CKD, squamous cell cancer of right lung status post lobectomy in 2020   OT comments  Pt progressing well towards goals. Progressed to complete grooming and self-feeding with set up assist. Pt received in recliner, able to complete x2 STS with mod assist. When attempting to progress mobility, pt taking 5 steps before stopping and stating I can't and attempting to sit prematurely. With cues pt able to take 2 steps back before sitting prematurely, +2 assist to guide hips into recliner. Pt continues to be limited not only by decreased strength, balance, activity tolerance, but also cog and inability to follow simple commands create safety risks. Continue to recommend <3 hours of skilled rehab daily to optimize independence levels. Will continue to follow acutely.       If plan is discharge home, recommend the following:  Two people to help with walking and/or transfers;Two people to help with bathing/dressing/bathroom;Assistance with cooking/housework;Supervision due to cognitive status;Help with stairs or ramp for entrance   Equipment Recommendations  Other (comment) (Defer to next venue)    Recommendations for Other Services      Precautions / Restrictions Precautions Precautions: Fall Recall of Precautions/Restrictions: Impaired Precaution/Restrictions Comments: watch HR and O2 Restrictions Weight Bearing  Restrictions Per Provider Order: No       Mobility Bed Mobility   General bed mobility comments: Received in recliner    Transfers Overall transfer level: Needs assistance Equipment used: Rolling walker (2 wheels) Transfers: Sit to/from Stand, Bed to chair/wheelchair/BSC Sit to Stand: Mod assist, +2 safety/equipment     Step pivot transfers: Mod assist, +2 safety/equipment     General transfer comment: Pt mod assist to come to stand, pt taking ~ 5 steps before stopping c/o of pain  and attempting to sit before chair is near, +2 for safety and managing RW     Balance Overall balance assessment: Needs assistance Sitting-balance support: Bilateral upper extremity supported, Feet supported Sitting balance-Leahy Scale: Fair     Standing balance support: Bilateral upper extremity supported, During functional activity, Reliant on assistive device for balance Standing balance-Leahy Scale: Poor Standing balance comment: Reliant on RW       ADL either performed or assessed with clinical judgement   ADL Overall ADL's : Needs assistance/impaired Eating/Feeding: Set up;Sitting   Grooming: Set up;Sitting       Toilet Transfer: Moderate assistance;+2 for physical assistance;+2 for safety/equipment;Stand-pivot;Rolling walker (2 wheels) Toilet Transfer Details (indicate cue type and reason): Simulated in room, mod assist to stand, +2 for safety and cues         Functional mobility during ADLs: Moderate assistance;+2 for safety/equipment;Rolling walker (2 wheels) General ADL Comments: Cog severely impacting performance, pt with difficulty following commands and sequencing, high fall risk    Extremity/Trunk Assessment Upper Extremity Assessment Upper Extremity Assessment: Difficult to assess due to impaired cognition   Lower Extremity Assessment Lower Extremity Assessment: Defer to PT evaluation        Vision   Vision Assessment?: No apparent visual deficits  Communication Communication Communication: Impaired Factors Affecting Communication: Reduced clarity of speech;Difficulty expressing self   Cognition Arousal: Alert Behavior During Therapy: Flat affect Cognition: Cognition impaired   Orientation impairments: Time, Situation Awareness: Intellectual awareness impaired, Online awareness impaired Memory impairment (select all impairments): Short-term memory, Declarative long-term memory, Working memory, Non-declarative long-term memory Attention impairment (select first level of impairment): Focused attention Executive functioning impairment (select all impairments): Initiation, Organization, Sequencing, Reasoning, Problem solving OT - Cognition Comments: With repetition pt able to answer simple questions,but will perseverate, repeating the answer, fixated gaze, difficulty with iniation, sequencing, organization                 Following commands: Impaired Following commands impaired: Follows one step commands inconsistently      Cueing   Cueing Techniques: Verbal cues, Tactile cues, Visual cues, Gestural cues        General Comments O2 stable on 3L    Pertinent Vitals/ Pain       Pain Assessment Pain Assessment: Faces Faces Pain Scale: Hurts whole lot Pain Location: bil feet Pain Descriptors / Indicators: Grimacing, Guarding, Discomfort, Moaning Pain Intervention(s): Limited activity within patient's tolerance         Frequency  Min 2X/week        Progress Toward Goals  OT Goals(current goals can now be found in the care plan section)  Progress towards OT goals: Progressing toward goals  Acute Rehab OT Goals Patient Stated Goal: None stated OT Goal Formulation: With patient/family Time For Goal Achievement: 11/22/23 Potential to Achieve Goals: Good ADL Goals Pt Will Perform Lower Body Bathing: with contact guard assist;sitting/lateral leans;sit to/from stand Pt Will Perform Lower Body Dressing: with  contact guard assist;sitting/lateral leans;sit to/from stand Pt Will Transfer to Toilet: with contact guard assist;ambulating Pt Will Perform Toileting - Clothing Manipulation and hygiene: with contact guard assist;sitting/lateral leans;sit to/from stand Additional ADL Goal #1: Patient will be able to follow 2-3 step commands consistently as a precursor to upper level cognitive tasks. Additional ADL Goal #2: Patient will be able to complete functional task in standing for 3 minutes in order to increase activity tolerance.  Plan         AM-PAC OT 6 Clicks Daily Activity     Outcome Measure   Help from another person eating meals?: None Help from another person taking care of personal grooming?: A Little Help from another person toileting, which includes using toliet, bedpan, or urinal?: A Lot Help from another person bathing (including washing, rinsing, drying)?: A Lot Help from another person to put on and taking off regular upper body clothing?: A Lot Help from another person to put on and taking off regular lower body clothing?: A Lot 6 Click Score: 15    End of Session Equipment Utilized During Treatment: Gait belt;Rolling walker (2 wheels)  OT Visit Diagnosis: Unsteadiness on feet (R26.81);Other abnormalities of gait and mobility (R26.89);Muscle weakness (generalized) (M62.81);Other symptoms and signs involving cognitive function;Pain   Activity Tolerance Patient limited by pain   Patient Left in chair;with call bell/phone within reach;with chair alarm set;with family/visitor present   Nurse Communication Mobility status        Time: 9695-9663 OT Time Calculation (min): 32 min  Charges: OT General Charges $OT Visit: 1 Visit OT Treatments $Self Care/Home Management : 23-37 mins  Adrianne BROCKS, OT  Acute Rehabilitation Services Office [phone] Secure chat preferred   Adrianne GORMAN Savers 11/11/2023, 3:47 PM

## 2023-11-11 NOTE — Progress Notes (Signed)
 [facility]  Date of Admission:  11/04/2023      Date of Admission:  11/04/2023                            Total days of antibiotics 2             Daptomycin  8/13 >> c Vancomycin  8/12 >> 8/13             Ceftriaxone  8/12 >> 8/13          ASSESSMENT: Maria Arroyo is a 73 y.o. female admitted with:   Acute CHF -  Peripheral Edema, SOB -  Diureseed > 20 lbs with milrinone  and diuretics. Now off milrinone .  PICC Out    MRSA Bacteremia, Nosocomial Onset -  PICC placed 8/8 , removed 8/13 -  Back pain -  Concern for CLABSI. PICC removed yesterday - new blood cultures ordered today.  TEE consult placed today. Her back pain seems settled today - will check CRP/ESR to see if we need to worry about persuing back MRI. She is MAE again today.  - Continue Daptomycin  for treatment  - FU back exams  - CRP / ESR in AM  - Repeat blood cultures ordered  - Cards consult for TEE Placed    New Rash to Lower Extremeties - Appears c/w pressure injury. Non blanchable purple/red. No petechiae. NO IE stigmata Follow.     Plan: - Continue Daptomycin  for treatment  - FU back exams  - CRP / ESR in AM  - Repeat blood cultures ordered  - Cards consult for TEE Placed     Principal Problem:   Acute HFrEF (heart failure with reduced ejection fraction) (HCC) Active Problems:   Coronary artery disease   Essential hypertension   Cigarette smoker   Anxiety and depression   Atrial fibrillation (HCC)   COPD (chronic obstructive pulmonary disease) (HCC)   Controlled type 2 diabetes mellitus without complication, without long-term current use of insulin  (HCC)   Acute kidney injury superimposed on chronic kidney disease (HCC)   Elevated troponin   Long QT interval   Normocytic anemia   History of lung cancer   Acute on chronic diastolic heart failure (HCC)   Acute hypoxic respiratory failure (HCC)   NSTEMI (non-ST elevated myocardial infarction)  (HCC)    amiodarone   200 mg Oral Daily   apixaban   5 mg Oral BID   busPIRone   7.5 mg Oral BID   Chlorhexidine  Gluconate Cloth  6 each Topical Daily   ferrous sulfate   325 mg Oral Daily   hydrALAZINE   25 mg Oral TID   insulin  aspart  0-9 Units Subcutaneous TID WC   isosorbide  dinitrate  10 mg Oral TID   lidocaine   1 patch Transdermal Q24H   nicotine   21 mg Transdermal Daily   polyethylene glycol  17 g Oral Daily   rosuvastatin   20 mg Oral Daily   sodium chloride  flush  10-40 mL Intracatheter Q12H   sodium chloride  flush  3 mL Intravenous Q12H   venlafaxine  XR  75 mg Oral Daily    SUBJECTIVE: Daughter reports she is doing better. Back pain is improved with resuming medications. She has muliple rib fractures as well that were causing discomfort.  She is still confused - when asked questions she only offers 65 (age today after birthday).   Review of Systems: ROS  Allergies  Allergen Reactions   Ace Inhibitors Other (See Comments)    Worsening renal function    Codeine Nausea Only    OBJECTIVE: Vitals:   11/11/23 0035 11/11/23 0507 11/11/23 0740 11/11/23 1111  BP: 130/73 138/75 120/72 120/62  Pulse: 68 77 79 75  Resp: 17 18 18  (!) 24  Temp: 97.7 F (36.5 C) 98.3 F (36.8 C) (!) 96.4 F (35.8 C) (!) 97.4 F (36.3 C)  TempSrc: Oral Oral Axillary Oral  SpO2: 97% 100% 100% 99%  Weight:  63 kg    Height:       Body mass index is 28.05 kg/m.  Physical Exam Vitals reviewed.  Cardiovascular:     Rate and Rhythm: Normal rate and regular rhythm.  Pulmonary:     Effort: Pulmonary effort is normal.     Breath sounds: Normal breath sounds.  Abdominal:     Palpations: Abdomen is soft.  Musculoskeletal:        General: Normal range of motion.     Cervical back: Normal range of motion.  Skin:    General: Skin is warm and dry.     Findings: Rash present.  Neurological:     Mental Status: She is alert. She is disoriented.        Lab Results Lab Results   Component Value Date   WBC 14.9 (H) 11/11/2023   HGB 8.8 (L) 11/11/2023   HCT 29.2 (L) 11/11/2023   MCV 85.6 11/11/2023   PLT 283 11/11/2023    Lab Results  Component Value Date   CREATININE 2.22 (H) 11/11/2023   BUN 76 (H) 11/11/2023   NA 133 (L) 11/11/2023   K 4.4 11/11/2023   CL 85 (L) 11/11/2023   CO2 32 11/11/2023    Lab Results  Component Value Date   ALT 137 (H) 11/10/2023   AST 161 (H) 11/10/2023   ALKPHOS 145 (H) 11/10/2023   BILITOT 0.9 11/10/2023     Microbiology: Recent Results (from the past 240 hours)  Resp panel by RT-PCR (RSV, Flu A&B, Covid) Anterior Nasal Swab     Status: None   Collection Time: 11/09/23 10:19 AM   Specimen: Anterior Nasal Swab  Result Value Ref Range Status   SARS Coronavirus 2 by RT PCR NEGATIVE NEGATIVE Final   Influenza A by PCR NEGATIVE NEGATIVE Final   Influenza B by PCR NEGATIVE NEGATIVE Final    Comment: (NOTE) The Xpert Xpress SARS-CoV-2/FLU/RSV plus assay is intended as an aid in the diagnosis of influenza from Nasopharyngeal swab specimens and should not be used as a sole basis for treatment. Nasal washings and aspirates are unacceptable for Xpert Xpress SARS-CoV-2/FLU/RSV testing.  Fact Sheet for Patients: BloggerCourse.com  Fact Sheet for Healthcare Providers: SeriousBroker.it  This test is not yet approved or cleared by the [place]  FDA and has been authorized for detection and/or diagnosis of SARS-CoV-2 by FDA under an Emergency Use Authorization (EUA). This EUA will remain in effect (meaning this test can be used) for the duration of the COVID-19 declaration under Section 564(b)(1) of the Act, 21 U.S.C. section 360bbb-3(b)(1), unless the authorization is terminated or revoked.     Resp Syncytial Virus by PCR NEGATIVE NEGATIVE Final    Comment: (NOTE) Fact Sheet for Patients: BloggerCourse.com  Fact Sheet for Healthcare  Providers: SeriousBroker.it  This test is not yet approved or cleared by the [place]  FDA and has been authorized for detection and/or diagnosis of SARS-CoV-2 by FDA under an Emergency Use Authorization (EUA).  This EUA will remain in effect (meaning this test can be used) for the duration of the COVID-19 declaration under Section 564(b)(1) of the Act, 21 U.S.C. section 360bbb-3(b)(1), unless the authorization is terminated or revoked.  Performed at [facility], [address] [place], [place] [address]   Culture, blood (Routine X 2) w Reflex to ID Panel     Status: Abnormal   Collection Time: 11/09/23 10:25 AM   Specimen: BLOOD RIGHT HAND  Result Value Ref Range Status   Specimen Description BLOOD RIGHT HAND  Final   Special Requests   Final    BOTTLES DRAWN AEROBIC AND ANAEROBIC Blood Culture adequate volume   Culture  Setup Time   Final    GRAM POSITIVE COCCI IN CLUSTERS IN BOTH AEROBIC AND ANAEROBIC BOTTLES CRITICAL VALUE NOTED.  VALUE IS CONSISTENT WITH PREVIOUSLY REPORTED AND CALLED VALUE.    Culture (A)  Final    STAPHYLOCOCCUS AUREUS SUSCEPTIBILITIES PERFORMED ON PREVIOUS CULTURE WITHIN THE LAST 5 DAYS. Performed at [facility], [address] [place], [place] [address]    Report Status 11/11/2023 FINAL  Final  Culture, blood (Routine X 2) w Reflex to ID Panel     Status: Abnormal (Preliminary result)   Collection Time: 11/09/23 10:38 AM   Specimen: BLOOD RIGHT ARM  Result Value Ref Range Status   Specimen Description BLOOD RIGHT ARM  Final   Special Requests   Final    BOTTLES DRAWN AEROBIC AND ANAEROBIC Blood Culture adequate volume   Culture  Setup Time   Final    GRAM POSITIVE COCCI IN CLUSTERS IN BOTH AEROBIC AND ANAEROBIC BOTTLES CRITICAL RESULT CALLED TO, READ BACK BY AND VERIFIED WITH: PHARMD J [facility] 11/10/2023 @ 0159 BY AB    Culture (A)  Final    METHICILLIN RESISTANT STAPHYLOCOCCUS AUREUS Sent to [facility]  for further susceptibility testing. Performed at [facility], [address] [place], [place] [address]    Report Status PENDING  Incomplete   Organism ID, Bacteria METHICILLIN RESISTANT STAPHYLOCOCCUS AUREUS  Final      Susceptibility   Methicillin resistant staphylococcus aureus - MIC*    CIPROFLOXACIN >=8 RESISTANT Resistant     ERYTHROMYCIN >=8 RESISTANT Resistant     GENTAMICIN <=0.5 SENSITIVE Sensitive     OXACILLIN >=4 RESISTANT Resistant     TETRACYCLINE <=1 SENSITIVE Sensitive     VANCOMYCIN  1 SENSITIVE Sensitive     TRIMETH/SULFA <=10 SENSITIVE Sensitive     CLINDAMYCIN <=0.25 SENSITIVE Sensitive     RIFAMPIN <=0.5 SENSITIVE Sensitive     Inducible Clindamycin NEGATIVE Sensitive     LINEZOLID 2 SENSITIVE Sensitive     * METHICILLIN RESISTANT STAPHYLOCOCCUS AUREUS  Blood Culture ID Panel (Reflexed)     Status: Abnormal   Collection Time: 11/09/23 10:38 AM  Result Value Ref Range Status   Enterococcus faecalis NOT DETECTED NOT DETECTED Final   Enterococcus Faecium NOT DETECTED NOT DETECTED Final   Listeria monocytogenes NOT DETECTED NOT DETECTED Final   Staphylococcus species DETECTED (A) NOT DETECTED Final    Comment: CRITICAL RESULT CALLED TO, READ BACK BY AND VERIFIED WITH: PHARMD J [facility] 11/10/2023 @ 0159 BY AB    Staphylococcus aureus (BCID) DETECTED (A) NOT DETECTED Final    Comment: Methicillin (oxacillin)-resistant Staphylococcus aureus (MRSA). MRSA is predictably resistant to beta-lactam antibiotics (except ceftaroline). Preferred therapy is vancomycin  unless clinically contraindicated. Patient requires contact precautions if  hospitalized. CRITICAL RESULT CALLED TO, READ BACK BY AND  VERIFIED WITH: PHARMD J [facility] 11/10/2023 @ 0159 BY AB    Staphylococcus epidermidis NOT DETECTED NOT DETECTED Final   Staphylococcus lugdunensis NOT DETECTED NOT DETECTED Final   Streptococcus species NOT DETECTED NOT DETECTED Final   Streptococcus agalactiae NOT DETECTED  NOT DETECTED Final   Streptococcus pneumoniae NOT DETECTED NOT DETECTED Final   Streptococcus pyogenes NOT DETECTED NOT DETECTED Final   A.calcoaceticus-baumannii NOT DETECTED NOT DETECTED Final   Bacteroides fragilis NOT DETECTED NOT DETECTED Final   Enterobacterales NOT DETECTED NOT DETECTED Final   Enterobacter cloacae complex NOT DETECTED NOT DETECTED Final   Escherichia coli NOT DETECTED NOT DETECTED Final   Klebsiella aerogenes NOT DETECTED NOT DETECTED Final   Klebsiella oxytoca NOT DETECTED NOT DETECTED Final   Klebsiella pneumoniae NOT DETECTED NOT DETECTED Final   Proteus species NOT DETECTED NOT DETECTED Final   Salmonella species NOT DETECTED NOT DETECTED Final   Serratia marcescens NOT DETECTED NOT DETECTED Final   Haemophilus influenzae NOT DETECTED NOT DETECTED Final   Neisseria meningitidis NOT DETECTED NOT DETECTED Final   Pseudomonas aeruginosa NOT DETECTED NOT DETECTED Final   Stenotrophomonas maltophilia NOT DETECTED NOT DETECTED Final   Candida albicans NOT DETECTED NOT DETECTED Final   Candida auris NOT DETECTED NOT DETECTED Final   Candida glabrata NOT DETECTED NOT DETECTED Final   Candida krusei NOT DETECTED NOT DETECTED Final   Candida parapsilosis NOT DETECTED NOT DETECTED Final   Candida tropicalis NOT DETECTED NOT DETECTED Final   Cryptococcus neoformans/gattii NOT DETECTED NOT DETECTED Final   Meth resistant mecA/C and MREJ DETECTED (A) NOT DETECTED Final    Comment: CRITICAL RESULT CALLED TO, READ BACK BY AND VERIFIED WITH: PHARMD J [facility] 11/10/2023 @ 0159 BY AB Performed at [facility], [address] [place], [place] [address]      Corean Fireman, MSN, NP-C [facility]  [email or web address] Pager: [phone] Office: [phone] [facility] Main Line: [phone] *Secure Chat Communication Welcome  Total Encounter Time: 14

## 2023-11-11 NOTE — Plan of Care (Signed)
  Problem: Education: Goal: Knowledge of General Education information will improve Description: Including pain rating scale, medication(s)/side effects and non-pharmacologic comfort measures Outcome: Progressing   Problem: Clinical Measurements: Goal: Ability to maintain clinical measurements within normal limits will improve Outcome: Progressing   Problem: Clinical Measurements: Goal: Will remain free from infection Outcome: Progressing   Problem: Elimination: Goal: Will not experience complications related to urinary retention Outcome: Progressing   Problem: Health Behavior/Discharge Planning: Goal: Ability to manage health-related needs will improve Outcome: Progressing   Problem: Health Behavior/Discharge Planning: Goal: Ability to identify and utilize available resources and services will improve Outcome: Progressing

## 2023-11-11 NOTE — TOC Progression Note (Signed)
 Transition of Care [facility]) - Progression Note    Patient Details  Name: Maria Arroyo MRN: [id number] Date of Birth: 05/13/1950  [facility] CM/SW Contact  Luise JAYSON Pan, [place] Phone Number: 11/11/2023, 12:11 PM  Clinical Narrative:   Per daily meeting with treatment team, patient is not medically stable and may need a few more days inpatient.   CSW spoke with patients spouse, Sherida, about current bed offers for SNF. Billy asked if CSW and him can review bed offers tomorrow when he is at the hospital. CSW will follow up   CSW will continue to follow.   Expected Discharge Plan: Skilled Nursing Facility Barriers to Discharge: Continued Medical Work up, SNF Pending bed offer, English as a second language teacher               Expected Discharge Plan and Services   Discharge Planning Services: CM Consult Post Acute Care Choice: Home Health Living arrangements for the past 2 months: Single Family Home                           HH Arranged: RN, PT [facility] Agency: [facility] Date [facility] Agency Contacted: 11/05/23 Time HH Agency Contacted: 1737 Representative spoke with at [facility] Agency: Channing Ee   Social Drivers of Health (SDOH) Interventions SDOH Screenings   Food Insecurity: No Food Insecurity (11/04/2023)  Recent Concern: Food Insecurity - Food Insecurity Present (08/11/2023)  Housing: Low Risk  (11/04/2023)  Transportation Needs: No Transportation Needs (11/04/2023)  Utilities: Not At Risk (11/04/2023)  Depression (PHQ2-9): Low Risk  (03/17/2018)  Financial Resource Strain: Low Risk  (05/12/2023)   Received from [facility]  Physical Activity: Unknown (03/10/2023)   Received from [facility]  Social Connections: Moderately Isolated (11/04/2023)  Stress: No Stress Concern Present (03/10/2023)   Received from [facility]  Tobacco Use: High Risk (11/09/2023)   Received from [facility]    Readmission Risk Interventions    07/06/2023    11:09 AM  Readmission Risk Prevention Plan  Transportation Screening Complete  HRI or Home Care Consult Complete  Social Work Consult for Recovery Care Planning/Counseling Complete  Palliative Care Screening Not Applicable  Medication Review Oceanographer) Referral to Pharmacy

## 2023-11-11 NOTE — Progress Notes (Signed)
 PHARMACY - ANTICOAGULATION CONSULT NOTE  Pharmacy Consult for Apixaban  Indication: atrial fibrillation  Allergies  Allergen Reactions   Ace Inhibitors Other (See Comments)    Worsening renal function    Codeine Nausea Only    Patient Measurements: Height: 4' 11 (149.9 cm) Weight: 63 kg (138 lb 14.2 oz) IBW/kg (Calculated) : 43.2 HEPARIN  DW (KG): 59  Vital Signs: Temp: 97.4 F (36.3 C) (08/14 1111) Temp Source: Oral (08/14 1111) BP: 120/62 (08/14 1111) Pulse Rate: 75 (08/14 1111)  Labs: Recent Labs    11/09/23 0211 11/10/23 0442 11/10/23 1946 11/11/23 0836  HGB 8.9* 9.1*  --  8.8*  HCT 29.7* 29.9*  --  29.2*  PLT 276 273  --  283  APTT  --   --  84* 82*  HEPARINUNFRC  --   --   --  >1.10*  CREATININE 1.91* 2.21*  --  2.22*  CKTOTAL  --   --   --  120    Estimated Creatinine Clearance: 18.2 mL/min (A) (by C-G formula based on SCr of 2.22 mg/dL (H)).   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   11/03/2023 Morning   albuterol  (VENTOLIN  HFA) 108 (90 Base) MCG/ACT inhaler Inhale 2 puffs into the lungs every 6 (six) hours as needed for shortness of breath or wheezing.   11/03/2023 Noon   amiodarone  (PACERONE ) 200 MG tablet Take 1 tablet (200 mg total) by mouth daily. 90 tablet 3 11/03/2023 Morning   apixaban  (ELIQUIS ) 5 MG TABS tablet Take 1 tablet (5 mg total) by mouth 2 (two) times  daily. 180 tablet 3 11/03/2023 at  8:30 PM   Ascorbic Acid (VITAMIN C PO) Take 1 tablet by mouth daily.   11/03/2023 Morning   busPIRone  (BUSPAR ) 15 MG tablet Take 15 mg by mouth 2 (two) times daily.   11/03/2023 Bedtime   Cholecalciferol  (VITAMIN D) 2000 UNITS tablet Take 2,000 Units by mouth daily.   11/03/2023 Morning   gabapentin  (NEURONTIN ) 600 MG tablet Take 600 mg by mouth in the morning, at noon, in the evening, and at bedtime.   11/03/2023 Evening   hydrALAZINE  (APRESOLINE ) 25 MG tablet Take 25 mg by mouth 3 (three) times daily.   11/03/2023 at  9:30 PM   Iron , Ferrous Sulfate , 325 (65 Fe) MG TABS Take 1 tablet by mouth daily. 30 tablet 3 11/03/2023 Morning   isosorbide  dinitrate (ISORDIL ) 10 MG tablet Take 1 tablet (10 mg total) by mouth 3 (three) times daily. 90 tablet 1 11/03/2023 at  9:30 PM   Misc. Devices MISC Inhale 3 L into the lungs continuous.   11/03/2023   Multiple Vitamin (MULTIVITAMIN WITH MINERALS) TABS tablet Take 1 tablet by mouth daily. 30 tablet 0 11/03/2023 Morning   nicotine  (NICODERM  CQ - DOSED IN MG/24 HOURS) 21 mg/24hr patch Place 1 patch (21 mg total) onto the skin daily. 28 patch 0 11/03/2023 Morning   nitroGLYCERIN  (NITROSTAT ) 0.4 MG SL tablet PLACE 1 TABLET UNDER THE TONGUE EVERY 5 MINUTES AS NEEDED CHEST PAIN. 25 tablet 1 Unknown   potassium chloride  SA (KLOR-CON  M) 20 MEQ tablet Take 20 mEq by mouth daily.   11/03/2023 Morning   rosuvastatin  (CRESTOR ) 40 MG tablet TAKE 1 TABLET BY MOUTH DAILY 90 tablet 3 11/03/2023 Morning   torsemide  (DEMADEX ) 20 MG tablet Take 1 tablet (20 mg total) by mouth daily.   11/03/2023 Morning   venlafaxine  XR (EFFEXOR -XR) 150 MG 24 hr capsule Take 150 mg by mouth daily.  0 11/03/2023 Morning    Assessment: Apixaban  for A Fib Goal of Therapy:  Therapeutic anticoagulation Monitor platelets by anticoagulation protocol: Yes   Plan:  Apixaban  5mg  BID ordered.  Altin Sease CHRISTELLA Brazier 11/11/2023,12:14 PM

## 2023-11-11 NOTE — Progress Notes (Signed)
 Patient noted to have skin discoloration to bilateral feet dorsal aspect after unna boots were removed at 1030. Please see wound LDA. Both ankles, medial aspect present with small discoloration from the same. Skin cleansed, protective ointment applied. Daughter at bedside and witnessed the same. MD aware unna boot discontinued. Wound RN consulted. Verdel LOISE Shams, RN

## 2023-11-11 NOTE — Progress Notes (Signed)
   11/11/23 2109  BiPAP/CPAP/SIPAP  Reason BIPAP/CPAP not in use Other(comment) (pt refused at this time.)

## 2023-11-11 NOTE — Progress Notes (Signed)
 Pharmacy Antibiotic Note  Maria Arroyo is a 73 y.o. female admitted on 11/04/2023 with MRSA bacteremia.  Pharmacy has been consulted for Daptomycin  dosing.  Renal function stable, will continue with q48h dosing for now. Baseline CK 120, Dapto MIC ordered, and repeat BCx sent.   Plan: - Continue Daptomcyin 500 mg IV every 48 hours - CK checks weekly on Thursdays - Will monitor work-up, renal function, and plans for LOT  Height: 4' 11 (149.9 cm) Weight: 63 kg (138 lb 14.2 oz) IBW/kg (Calculated) : 43.2  Temp (24hrs), Avg:97.6 F (36.4 C), Min:96.4 F (35.8 C), Max:98.3 F (36.8 C)  Recent Labs  Lab 11/04/23 1618 11/05/23 0233 11/05/23 1229 11/06/23 0543 11/07/23 0535 11/08/23 0556 11/09/23 0211 11/09/23 1038 11/09/23 1250 11/10/23 0442 11/11/23 0836  WBC  --    < >  --    < > 9.0 14.1* 19.6*  --   --  15.9* 14.9*  CREATININE  --    < >  --    < > 2.18* 2.08* 1.91*  --   --  2.21* 2.22*  LATICACIDVEN 1.3  --  1.1  --   --   --   --  1.0 0.6  --   --    < > = values in this interval not displayed.    Estimated Creatinine Clearance: 18.2 mL/min (A) (by C-G formula based on SCr of 2.22 mg/dL (H)).    Allergies  Allergen Reactions   Ace Inhibitors Other (See Comments)    Worsening renal function    Codeine Nausea Only    Antimicrobials this admission: Rocephin  8/12 x 1 Vancomycin  8/13 x 1 Daptomycin  8/14 >>  Microbiology results: 8/12 BCx >>  MRSA 8/12 COVID/flu >> neg  Thank you for allowing pharmacy to be a part of this patient's care.  Maria Arroyo, PharmD, BCPS, BCIDP Infectious Diseases Clinical Pharmacist 11/11/2023 12:49 PM   **Pharmacist phone directory can now be found on [email or web address] ([email or web address]).  Listed under [facility].

## 2023-11-12 ENCOUNTER — Inpatient Hospital Stay (HOSPITAL_COMMUNITY): Admitting: Anesthesiology

## 2023-11-12 ENCOUNTER — Encounter (HOSPITAL_COMMUNITY): Admission: EM | Disposition: E | Payer: Self-pay | Source: Home / Self Care | Attending: Internal Medicine

## 2023-11-12 ENCOUNTER — Inpatient Hospital Stay (HOSPITAL_COMMUNITY)

## 2023-11-12 DIAGNOSIS — I10 Essential (primary) hypertension: Secondary | ICD-10-CM | POA: Diagnosis not present

## 2023-11-12 DIAGNOSIS — R7881 Bacteremia: Secondary | ICD-10-CM

## 2023-11-12 DIAGNOSIS — B9562 Methicillin resistant Staphylococcus aureus infection as the cause of diseases classified elsewhere: Secondary | ICD-10-CM

## 2023-11-12 DIAGNOSIS — I340 Nonrheumatic mitral (valve) insufficiency: Secondary | ICD-10-CM | POA: Diagnosis not present

## 2023-11-12 DIAGNOSIS — I2510 Atherosclerotic heart disease of native coronary artery without angina pectoris: Secondary | ICD-10-CM

## 2023-11-12 DIAGNOSIS — F17210 Nicotine dependence, cigarettes, uncomplicated: Secondary | ICD-10-CM

## 2023-11-12 DIAGNOSIS — I361 Nonrheumatic tricuspid (valve) insufficiency: Secondary | ICD-10-CM | POA: Diagnosis not present

## 2023-11-12 DIAGNOSIS — I5021 Acute systolic (congestive) heart failure: Secondary | ICD-10-CM | POA: Diagnosis not present

## 2023-11-12 LAB — BASIC METABOLIC PANEL WITH GFR
Anion gap: 16 — ABNORMAL HIGH (ref 5–15)
BUN: 82 mg/dL — ABNORMAL HIGH (ref 8–23)
CO2: 31 mmol/L (ref 22–32)
Calcium: 8.9 mg/dL (ref 8.9–10.3)
Chloride: 86 mmol/L — ABNORMAL LOW (ref 98–111)
Creatinine, Ser: 2.32 mg/dL — ABNORMAL HIGH (ref 0.44–1.00)
GFR, Estimated: 22 mL/min — ABNORMAL LOW (ref >60)
Glucose, Bld: 140 mg/dL — ABNORMAL HIGH (ref 70–99)
Potassium: 4 mmol/L (ref 3.5–5.1)
Sodium: 133 mmol/L — ABNORMAL LOW (ref 135–145)

## 2023-11-12 LAB — SEDIMENTATION RATE: Sed Rate: 92 mm/h — ABNORMAL HIGH (ref 0–22)

## 2023-11-12 LAB — ECHO TEE

## 2023-11-12 LAB — CBC
HCT: 28.4 % — ABNORMAL LOW (ref 36.0–46.0)
Hemoglobin: 8.6 g/dL — ABNORMAL LOW (ref 12.0–15.0)
MCH: 25.4 pg — ABNORMAL LOW (ref 26.0–34.0)
MCHC: 30.3 g/dL (ref 30.0–36.0)
MCV: 83.8 fL (ref 80.0–100.0)
Platelets: 314 K/uL (ref 150–400)
RBC: 3.39 MIL/uL — ABNORMAL LOW (ref 3.87–5.11)
RDW: 17.9 % — ABNORMAL HIGH (ref 11.5–15.5)
WBC: 15.5 K/uL — ABNORMAL HIGH (ref 4.0–10.5)
nRBC: 0.3 % — ABNORMAL HIGH (ref 0.0–0.2)

## 2023-11-12 LAB — GLUCOSE, CAPILLARY
Glucose-Capillary: 149 mg/dL — ABNORMAL HIGH (ref 70–99)
Glucose-Capillary: 120 mg/dL — ABNORMAL HIGH (ref 70–99)
Glucose-Capillary: 119 mg/dL — ABNORMAL HIGH (ref 70–99)
Glucose-Capillary: 173 mg/dL — ABNORMAL HIGH (ref 70–99)

## 2023-11-12 LAB — C-REACTIVE PROTEIN: CRP: 20 mg/dL — ABNORMAL HIGH (ref <1.0)

## 2023-11-12 SURGERY — TRANSESOPHAGEAL ECHOCARDIOGRAM (TEE) (CATHLAB)
Anesthesia: Monitor Anesthesia Care

## 2023-11-12 MED ORDER — PROPOFOL 500 MG/50ML IV EMUL
INTRAVENOUS | Status: DC | PRN
  Administered 2023-11-12: 125 ug/kg/min via INTRAVENOUS

## 2023-11-12 MED ORDER — PHENYLEPHRINE HCL-NACL 20-0.9 MG/250ML-% IV SOLN
INTRAVENOUS | Status: DC | PRN
  Administered 2023-11-12: 25 ug/min via INTRAVENOUS

## 2023-11-12 MED ORDER — SODIUM CHLORIDE 0.9 % IV SOLN: INTRAVENOUS | Status: DC

## 2023-11-12 MED ORDER — PROPOFOL 10 MG/ML IV BOLUS
INTRAVENOUS | Status: DC | PRN
  Administered 2023-11-12: 30 mg via INTRAVENOUS
  Administered 2023-11-12: 10 mg via INTRAVENOUS

## 2023-11-12 MED ORDER — ONDANSETRON HCL 4 MG/2ML IJ SOLN
INTRAMUSCULAR | Status: DC | PRN
  Administered 2023-11-12: 4 mg via INTRAVENOUS

## 2023-11-12 MED ORDER — IPRATROPIUM-ALBUTEROL 0.5-2.5 (3) MG/3ML IN SOLN
RESPIRATORY_TRACT | Status: AC
  Administered 2023-11-12: 3 mL via RESPIRATORY_TRACT
  Filled 2023-11-12: qty 3

## 2023-11-12 MED ORDER — LIDOCAINE HCL (CARDIAC) PF 100 MG/5ML IV SOSY
PREFILLED_SYRINGE | INTRAVENOUS | Status: DC | PRN
  Administered 2023-11-12: 60 mg via INTRATRACHEAL

## 2023-11-12 MED ORDER — ALBUTEROL SULFATE HFA 108 (90 BASE) MCG/ACT IN AERS
INHALATION_SPRAY | RESPIRATORY_TRACT | Status: DC | PRN
  Administered 2023-11-12: 2 via RESPIRATORY_TRACT

## 2023-11-12 NOTE — Consult Note (Signed)
 WOC Nurse Consult Note: Reason for Consult: Discovered after unna boots removal this morning. Please see pictures and advice on treatment, thank you.   Wound type: Deep Tissue Pressure Injuries; Medical Device Related  Pressure Injury POA: No Measurement: see nursing flow sheets Wound bed: dark purple areas x 4; non blanchable; intact skin  Drainage (amount, consistency, odor) none Periwound: intact Dressing procedure/placement/frequency: Single layer of xeroform gauze to the wounds bilateral-medial and dorsal foot, top with foam. Change xeroform daily, change foam every 3 days.   WOC Nurse team will follow with you and see patient within 10 days for wound assessments.  Please notify WOC nurses of any acute changes in the wounds or any new areas of concern Tamelia Michalowski [facility] MSN, RN,CWOCN, CNS, CWON-AP [phone]

## 2023-11-12 NOTE — Transfer of Care (Signed)
 Immediate Anesthesia Transfer of Care Note  Patient: Maria Arroyo  Procedure(s) Performed: TRANSESOPHAGEAL ECHOCARDIOGRAM  Patient Location: Cath Lab  Anesthesia Type:MAC  Level of Consciousness: awake and drowsy  Airway & Oxygen Therapy: Patient Spontanous Breathing and Patient connected to nasal cannula oxygen  Post-op Assessment: Report given to RN and Post -op Vital signs reviewed and stable  Post vital signs: Reviewed and stable  Last Vitals:  Vitals Value Taken Time  BP    Temp    Pulse    Resp    SpO2      Last Pain:  Vitals:   11/12/23 1152  TempSrc: Temporal  PainSc:       Patients Stated Pain Goal: 1 (11/11/23 2000)  Complications: No notable events documented.

## 2023-11-12 NOTE — CV Procedure (Signed)
     TRANSESOPHAGEAL ECHOCARDIOGRAM   NAME:  Maria Arroyo   MRN: [id number] DOB:  25-Oct-1950   ADMIT DATE: 11/04/2023  INDICATIONS: Bacteremia  PROCEDURE:   Informed consent was obtained prior to the procedure. The risks, benefits and alternatives for the procedure were discussed and the patient comprehended these risks.  Risks include, but are not limited to, cough, sore throat, vomiting, nausea, somnolence, esophageal and stomach trauma or perforation, bleeding, low blood pressure, aspiration, pneumonia, infection, trauma to the teeth and death.    After a procedural time-out, the oropharynx was anesthetized and the patient was sedated by the anesthesia service. The transesophageal probe was inserted in the esophagus and stomach without difficulty and multiple views were obtained. Anesthesia was monitored by Olivia Blower, CRNA.    COMPLICATIONS:    There were no immediate complications.  FINDINGS:  No vegetation seen  Lonni Nanas MD [facility]  [address] [place], [place] [address] [phone]   12:58 PM

## 2023-11-12 NOTE — Plan of Care (Signed)
  Problem: Education: Goal: Knowledge of General Education information will improve Description: Including pain rating scale, medication(s)/side effects and non-pharmacologic comfort measures Outcome: Progressing   Problem: Clinical Measurements: Goal: Ability to maintain clinical measurements within normal limits will improve Outcome: Progressing   Problem: Clinical Measurements: Goal: Will remain free from infection Outcome: Progressing   Problem: Clinical Measurements: Goal: Diagnostic test results will improve Outcome: Progressing   Problem: Clinical Measurements: Goal: Respiratory complications will improve Outcome: Progressing   Problem: Clinical Measurements: Goal: Cardiovascular complication will be avoided Outcome: Progressing   Problem: Elimination: Goal: Will not experience complications related to bowel motility Outcome: Progressing   Problem: Elimination: Goal: Will not experience complications related to urinary retention Outcome: Progressing

## 2023-11-12 NOTE — Progress Notes (Signed)
 Physical Therapy Treatment Patient Details Name: Maria Arroyo MRN: [id number] DOB: 1950-05-08 Today's Date: 11/12/2023   History of Present Illness Patient is a 73 yo female presenting to the ED with SOB on 11/04/23. Admitted with acute exacerbation of HFrEF and also noted NSTEMI with CAD and elevated troponin. Metabolic aklalosis, hypokalemia and transaminitis also noted. 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 Currently pt is Min A for bed mobility, 2 person Min A for sit to stand and side stepping at EOB. Pt is demonstrating a fear of falling as demonstrated by resisting ambulation even with chair follow stating she can only transfer to chair. Pt was able to stand for up to 90 seconds for peri care with Min A and RW. Due to pt current functional status, home set up and available assistance at home recommending skilled physical therapy services < 3 hours/day in order to address strength, balance and functional mobility to decrease risk for falls, injury, immobility, skin break down and re-hospitalization.      If plan is discharge home, recommend the following: Two people to help with walking and/or transfers;Assist for transportation;Help with stairs or ramp for entrance;Supervision due to cognitive status;Assistance with cooking/housework     Equipment Recommendations  Rolling walker (2 wheels);BSC/3in1       Precautions / Restrictions Precautions Precautions: Fall Recall of Precautions/Restrictions: Impaired Precaution/Restrictions Comments: watch HR and O2 Restrictions Weight Bearing Restrictions Per Provider Order: No     Mobility  Bed Mobility Overal bed mobility: Needs Assistance Bed Mobility: Supine  to Sit, Sit to Supine     Supine to sit: Min assist Sit to supine: Min assist   General bed mobility comments: Min A to get trunk to mid line and to scoot to EOB. Pt with MIn A for LE to get to supine.    Transfers Overall transfer level: Needs assistance Equipment used: Rolling walker (2 wheels) Transfers: Sit to/from Stand Sit to Stand: Min assist, +2 physical assistance, +2 safety/equipment           General transfer comment: Min A +2 for sit to stand at EOB 2x able to stand 1 min then 1 min 30 seconds.    Ambulation/Gait     Pre-gait activities: Pt was able to take side steps at EOB at 2 person Min A with assist for balance and navigating AD for 3 steps. Low foot clearance and difficulty with coordination General Gait Details: Pt declined ; stated she would transfer to chair but transport coming for TEE and needs pt in bed.      Balance Overall balance assessment: Needs assistance Sitting-balance support: Bilateral upper extremity supported, Feet supported Sitting balance-Leahy Scale: Fair Sitting balance - Comments: can static sit with UE support   Standing balance support: Bilateral upper extremity supported, During functional activity, Reliant on assistive device for balance Standing balance-Leahy Scale: Poor Standing balance comment: Reliant on RW      Communication Communication Communication: Impaired Factors Affecting Communication: Reduced clarity of speech;Difficulty expressing self  Cognition Arousal: Alert Behavior During Therapy: Flat affect   PT - Cognitive impairments: Difficult to assess, Problem solving, Safety/Judgement Difficult to assess due to: Hard of hearing/deaf       PT - Cognition Comments: Pt stating bed back  when asked if she has pain Following commands: Impaired Following commands impaired: Only follows one step commands consistently    Cueing Cueing Techniques: Verbal cues, Tactile cues, Visual cues, Gestural cues      General Comments General comments (skin integrity, edema, etc.): O2 sats in the mid to upper 90's throughout activity on 3L O2 via [place]      Pertinent Vitals/Pain Pain Assessment Faces Pain Scale: Hurts a little bit Pain Location: bil feet Pain Descriptors / Indicators: Discomfort Pain Intervention(s): Monitored during session     PT Goals (current goals can now be found in the care plan section) Acute Rehab PT Goals Patient Stated Goal: none stated PT Goal Formulation: Patient unable to participate in goal setting Time For Goal Achievement: 11/23/23 Potential to Achieve Goals: Fair Progress towards PT goals: Progressing toward goals    Frequency    Min 2X/week      PT Plan  Continue with current POC        AM-PAC PT 6 Clicks Mobility   Outcome Measure  Help needed turning from your back to your side while in a flat bed without using bedrails?: A Little Help needed moving from lying on your back to sitting on the side of a flat bed without using bedrails?: A Little Help needed moving to and from a bed to a chair (including a wheelchair)?: A Lot Help needed standing up from a chair using your arms (e.g., wheelchair or bedside chair)?: A Lot Help needed to walk in hospital room?: A Lot Help needed climbing 3-5 steps with a railing? : Total 6 Click Score: 13    End of Session Equipment Utilized During Treatment: Gait belt;Oxygen Activity Tolerance: Patient tolerated treatment well Patient left: in bed;Other (comment) (transportation) Nurse Communication: Mobility status PT Visit Diagnosis: Unsteadiness on feet (R26.81);Other abnormalities of gait and mobility (R26.89);Muscle weakness (generalized) (M62.81)     Time: 8874-8855 PT Time Calculation (min) (ACUTE ONLY): 19 min  Charges:      PT General Charges $$ ACUTE PT VISIT: 1 Visit                    Dorothyann Maier, DPT, CLT  Acute Rehabilitation Services Office: [phone] (Secure chat  preferred)    Dorothyann VEAR Maier 11/12/2023, 12:01 PM

## 2023-11-12 NOTE — Progress Notes (Signed)
 PROGRESS NOTE    Selyna Klahn  FMW:[id number] DOB: 1950-05-02 DOA: 11/04/2023 PCP: Aminta Lamar Hamilton, MD  72/F with history of chronic back pain, paroxysmal A-fib, hypertension, chronic diastolic CHF, COPD, chronic respiratory failure on 3 L home O2, lung cancer with right lobectomy in 2020, type 2 diabetes, depression, tobacco use presented to the ED with worsening dyspnea and CHF. - Diagnosed with new onset CHF, troponin 1139, EF down to 30-35% with BiV failure, cardiology following, briefly on milrinone , subsequently discontinued 8/12 -8/11 onwards became lethargic with leukocytosis, - 11/09/11 early a.m. blood cultures growing MRSA> started vancomycin  - 8/13, ID consult Vanc changed to daptomycin , PICC line removed 8   Subjective: -More awake today, some confusion, n.p.o. for TEE  Assessment and Plan:  MRSA bacteremia Sepsis, not POA - Developed low-grade fever, leukocytosis and encephalopathy since admission, blood cultures growing MRSA,, appreciate ID input, vancomycin  changed to daptomycin  - PICC line removed 8/13 - Plan for TEE today, ID following, considering CT versus MRI of LS spine unfortunately will not be able to get contrast - Repeat blood cultures negative so far  Encephalopathy - Suspect primarily secondary to sepsis and MRSA bacteremia - Much improved, decreased BuSpar  and Effexor  dose  Acute on chronic systolic and diastolic CHF New biventricular failure -Echo this admission noted EF down to 30-35%, moderately reduced RV, severely elevated dilated BAE - Concern for ischemic cardiomyopathy, troponin peaked at 1139, prior history of LAD stenting -Was on IV milrinone  for approximately 48 hours, now off - Deferring ischemic eval at this time in the setting of AKI, encephalopathy etc., cards following - Diuretics on hold at this time, continue hydralazine  and nitrates  NSTEMI - Troponin peaked at 1139, then trending down - Conservative management at  this time in the setting of sepsis, encephalopathy, AKI etc.  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  LFTs showing improvement this morning.  Will recheck in AM.   Diabetes mellitus - CBGs improving, continue Semglee    Iron  deficiency anemia - No overt bleeding.  Monitor   Paroxysmal atrial fibrillation - Now off IV amiodarone , apixaban    History of squamous cell carcinoma of the right lung - S/p right lobectomy in 2020.  COPD/chronic respiratory failure On 3 L home O2 at baseline   Tobacco abuse - Still smokes.Nicotine  patch was offered.   Obesity class I - BMI nearly 32.  Encouraged lifestyle modification and weight loss.   DVT prophylaxis: apixaban  Code Status: DNR, discussed with dtr at bedside yesterday Family Communication: Daughter at bedside Disposition Plan: TBD  Objective: Vitals:   11/12/23 0442 11/12/23 0709 11/12/23 1102 11/12/23 1152  BP: (!) 115/93 127/75 138/80 (!) 157/77  Pulse:  82 76 83  Resp: 19 (!) 23 18 17   Temp: 98.6 F (37 C) 98.4 F (36.9 C) 98.2 F (36.8 C) 98 F (36.7 C)  TempSrc: Oral Oral Oral Temporal  SpO2: 93%  100% 99%  Weight: 65.6 kg     Height:        Intake/Output Summary (Last 24 hours) at 11/12/2023 1254 Last data filed at 11/12/2023 1227 Gross per 24 hour  Intake 123 ml  Output 250 ml  Net -127 ml   Filed Weights   11/10/23  0425 11/11/23 0507 11/12/23 0442  Weight: 62.9 kg 63 kg 65.6 kg    Examination:  General exam: Awake, interactive, oriented to self and place Respiratory system: Poor air movement bilaterally Cardiovascular system: S1 & S2 heard, regular Abd: nondistended, soft and nontender.Normal bowel  sounds heard. Central nervous system: More interactive and responsive, still with some confusion, moves all extremities, no localizing signs  extremities: Trace edema, Unna boots out, some bruising noted near ankles Skin: As above Psychiatry: Flat affect    Data Reviewed:   CBC: Recent Labs  Lab 11/08/23 0556 11/09/23 0211 11/10/23 0442 11/11/23 0836 11/12/23 0225  WBC 14.1* 19.6* 15.9* 14.9* 15.5*  HGB 9.6* 8.9* 9.1* 8.8* 8.6*  HCT 31.5* 29.7* 29.9* 29.2* 28.4*  MCV 84.5 85.8 85.9 85.6 83.8  PLT 290 276 273 283 314   Basic Metabolic Panel: Recent Labs  Lab 11/06/23 0543 11/06/23 1534 11/07/23 0535 11/08/23 0556 11/09/23 0211 11/10/23 0442 11/11/23 0836 11/12/23 0225  NA 134*   < > 134* 135 135 134* 133* 133*  K 2.7*   < > 3.7 3.3* 3.1* 5.2* 4.4 4.0  CL 80*   < > 79* 76* 82* 87* 85* 86*  CO2 41*   < > 43* 44* 39* 32 32 31  GLUCOSE 147*   < > 122* 157* 149* 222* 105* 140*  BUN 59*   < > 54* 51* 50* 62* 76* 82*  CREATININE 2.18*   < > 2.18* 2.08* 1.91* 2.21* 2.22* 2.32*  CALCIUM  8.8*   < > 8.7* 9.0 9.0 8.9 9.0 8.9  MG 2.3  --  2.0 2.0 2.2 2.2  --   --   PHOS  --   --   --   --   --  4.8*  --   --    < > = values in this interval not displayed.   GFR: Estimated Creatinine Clearance: 17.8 mL/min (A) (by C-G formula based on SCr of 2.32 mg/dL (H)). Liver Function Tests: Recent Labs  Lab 11/06/23 0543 11/07/23 0535 11/08/23 0556 11/09/23 0211 11/10/23 0442  AST 108* 59* 37 35 161*  ALT 236* 167* 112* 78* 137*  ALKPHOS 172* 151* 124 110 145*  BILITOT 0.5 0.8 0.4 0.6 0.9  PROT 6.0* 5.9* 6.3* 6.0* 6.3*  ALBUMIN 2.6* 2.5* 2.4* 2.3* 2.2*   No results for input(s): LIPASE, AMYLASE in the last 168 hours. Recent Labs  Lab 11/08/23 1130  AMMONIA 41*   Coagulation Profile: No results for input(s): INR, PROTIME in the last 168 hours. Cardiac Enzymes: Recent Labs  Lab 11/11/23 0836  CKTOTAL 120   BNP (last 3 results) No results for input(s):  PROBNP in the last 8760 hours. HbA1C: No results for input(s): HGBA1C in the last 72 hours. CBG: Recent Labs  Lab 11/11/23 1202 11/11/23 1648 11/11/23 2103 11/12/23 0603 11/12/23 1106  GLUCAP 137* 119* 168* 149* 120*   Lipid Profile: No results for input(s): CHOL, HDL, LDLCALC, TRIG, CHOLHDL, LDLDIRECT in the last 72 hours. Thyroid Function Tests: No results for input(s): TSH, T4TOTAL, FREET4, T3FREE, THYROIDAB in the last 72 hours. Anemia Panel: No results for input(s): VITAMINB12, FOLATE, FERRITIN, TIBC, IRON , RETICCTPCT in the last 72 hours. Urine analysis:    Component Value Date/Time   COLORURINE YELLOW 11/09/2023 1155   APPEARANCEUR HAZY (A) 11/09/2023 1155   LABSPEC 1.015 11/09/2023 1155   PHURINE 8.0 11/09/2023 1155   GLUCOSEU NEGATIVE 11/09/2023 1155   HGBUR NEGATIVE 11/09/2023 1155   BILIRUBINUR NEGATIVE 11/09/2023  1155   KETONESUR NEGATIVE 11/09/2023 1155   PROTEINUR 30 (A) 11/09/2023 1155   NITRITE POSITIVE (A) 11/09/2023 1155   LEUKOCYTESUR NEGATIVE 11/09/2023 1155   Sepsis Labs: @LABRCNTIP (procalcitonin:4,lacticidven:4)  ) Recent Results (from the past 240 hours)  Resp panel by RT-PCR (RSV, Flu A&B, Covid) Anterior Nasal Swab     Status: None   Collection Time: 11/09/23 10:19 AM   Specimen: Anterior Nasal Swab  Result Value Ref Range Status   SARS Coronavirus 2 by RT PCR NEGATIVE NEGATIVE Final   Influenza A by PCR NEGATIVE NEGATIVE Final   Influenza B by PCR NEGATIVE NEGATIVE Final    Comment: (NOTE) The Xpert Xpress SARS-CoV-2/FLU/RSV plus assay is intended as an aid in the diagnosis of influenza from Nasopharyngeal swab specimens and should not be used as a sole basis for treatment. Nasal washings and aspirates are unacceptable for Xpert Xpress SARS-CoV-2/FLU/RSV testing.  Fact Sheet for Patients: BloggerCourse.com  Fact Sheet for Healthcare  Providers: SeriousBroker.it  This test is not yet approved or cleared by the [place]  FDA and has been authorized for detection and/or diagnosis of SARS-CoV-2 by FDA under an Emergency Use Authorization (EUA). This EUA will remain in effect (meaning this test can be used) for the duration of the COVID-19 declaration under Section 564(b)(1) of the Act, 21 U.S.C. section 360bbb-3(b)(1), unless the authorization is terminated or revoked.     Resp Syncytial Virus by PCR NEGATIVE NEGATIVE Final    Comment: (NOTE) Fact Sheet for Patients: BloggerCourse.com  Fact Sheet for Healthcare Providers: SeriousBroker.it  This test is not yet approved or cleared by the [place]  FDA and has been authorized for detection and/or diagnosis of SARS-CoV-2 by FDA under an Emergency Use Authorization (EUA). This EUA will remain in effect (meaning this test can be used) for the duration of the COVID-19 declaration under Section 564(b)(1) of the Act, 21 U.S.C. section 360bbb-3(b)(1), unless the authorization is terminated or revoked.  Performed at [facility], [address] [place], [place] [address]   Culture, blood (Routine X 2) w Reflex to ID Panel     Status: Abnormal   Collection Time: 11/09/23 10:25 AM   Specimen: BLOOD RIGHT HAND  Result Value Ref Range Status   Specimen Description BLOOD RIGHT HAND  Final   Special Requests   Final    BOTTLES DRAWN AEROBIC AND ANAEROBIC Blood Culture adequate volume   Culture  Setup Time   Final    GRAM POSITIVE COCCI IN CLUSTERS IN BOTH AEROBIC AND ANAEROBIC BOTTLES CRITICAL VALUE NOTED.  VALUE IS CONSISTENT WITH PREVIOUSLY REPORTED AND CALLED VALUE.    Culture (A)  Final    STAPHYLOCOCCUS AUREUS SUSCEPTIBILITIES PERFORMED ON PREVIOUS CULTURE WITHIN THE LAST 5 DAYS. Performed at [facility], [address], [place], [place] [address]    Report Status  11/11/2023 FINAL  Final  Culture, blood (Routine X 2) w Reflex to ID Panel     Status: Abnormal (Preliminary result)   Collection Time: 11/09/23 10:38 AM   Specimen: BLOOD RIGHT ARM  Result Value Ref Range Status   Specimen Description BLOOD RIGHT ARM  Final   Special Requests   Final    BOTTLES DRAWN AEROBIC AND ANAEROBIC Blood Culture adequate volume   Culture  Setup Time   Final    GRAM POSITIVE COCCI IN CLUSTERS IN BOTH AEROBIC AND ANAEROBIC BOTTLES CRITICAL RESULT CALLED TO, READ BACK BY AND VERIFIED WITH: PHARMD J [facility] 11/10/2023 @ 0159 BY AB  Culture (A)  Final    METHICILLIN RESISTANT STAPHYLOCOCCUS AUREUS Sent to [facility] for further susceptibility testing. Performed at [facility], [address], [place], [place] [address]    Report Status PENDING  Incomplete   Organism ID, Bacteria METHICILLIN RESISTANT STAPHYLOCOCCUS AUREUS  Final      Susceptibility   Methicillin resistant staphylococcus aureus - MIC*    CIPROFLOXACIN >=8 RESISTANT Resistant     ERYTHROMYCIN >=8 RESISTANT Resistant     GENTAMICIN <=0.5 SENSITIVE Sensitive     OXACILLIN >=4 RESISTANT Resistant     TETRACYCLINE <=1 SENSITIVE Sensitive     VANCOMYCIN  1 SENSITIVE Sensitive     TRIMETH/SULFA <=10 SENSITIVE Sensitive     CLINDAMYCIN <=0.25 SENSITIVE Sensitive     RIFAMPIN <=0.5 SENSITIVE Sensitive     Inducible Clindamycin NEGATIVE Sensitive     LINEZOLID 2 SENSITIVE Sensitive     * METHICILLIN RESISTANT STAPHYLOCOCCUS AUREUS  Blood Culture ID Panel (Reflexed)     Status: Abnormal   Collection Time: 11/09/23 10:38 AM  Result Value Ref Range Status   Enterococcus faecalis NOT DETECTED NOT DETECTED Final   Enterococcus Faecium NOT DETECTED NOT DETECTED Final   Listeria monocytogenes NOT DETECTED NOT DETECTED Final   Staphylococcus species DETECTED (A) NOT DETECTED Final    Comment: CRITICAL RESULT CALLED TO, READ BACK BY AND VERIFIED WITH: PHARMD J [facility] 11/10/2023 @ 0159 BY AB     Staphylococcus aureus (BCID) DETECTED (A) NOT DETECTED Final    Comment: Methicillin (oxacillin)-resistant Staphylococcus aureus (MRSA). MRSA is predictably resistant to beta-lactam antibiotics (except ceftaroline). Preferred therapy is vancomycin  unless clinically contraindicated. Patient requires contact precautions if  hospitalized. CRITICAL RESULT CALLED TO, READ BACK BY AND VERIFIED WITH: PHARMD J [facility] 11/10/2023 @ 0159 BY AB    Staphylococcus epidermidis NOT DETECTED NOT DETECTED Final   Staphylococcus lugdunensis NOT DETECTED NOT DETECTED Final   Streptococcus species NOT DETECTED NOT DETECTED Final   Streptococcus agalactiae NOT DETECTED NOT DETECTED Final   Streptococcus pneumoniae NOT DETECTED NOT DETECTED Final   Streptococcus pyogenes NOT DETECTED NOT DETECTED Final   A.calcoaceticus-baumannii NOT DETECTED NOT DETECTED Final   Bacteroides fragilis NOT DETECTED NOT DETECTED Final   Enterobacterales NOT DETECTED NOT DETECTED Final   Enterobacter cloacae complex NOT DETECTED NOT DETECTED Final   Escherichia coli NOT DETECTED NOT DETECTED Final   Klebsiella aerogenes NOT DETECTED NOT DETECTED Final   Klebsiella oxytoca NOT DETECTED NOT DETECTED Final   Klebsiella pneumoniae NOT DETECTED NOT DETECTED Final   Proteus species NOT DETECTED NOT DETECTED Final   Salmonella species NOT DETECTED NOT DETECTED Final   Serratia marcescens NOT DETECTED NOT DETECTED Final   Haemophilus influenzae NOT DETECTED NOT DETECTED Final   Neisseria meningitidis NOT DETECTED NOT DETECTED Final   Pseudomonas aeruginosa NOT DETECTED NOT DETECTED Final   Stenotrophomonas maltophilia NOT DETECTED NOT DETECTED Final   Candida albicans NOT DETECTED NOT DETECTED Final   Candida auris NOT DETECTED NOT DETECTED Final   Candida glabrata NOT DETECTED NOT DETECTED Final   Candida krusei NOT DETECTED NOT DETECTED Final   Candida parapsilosis NOT DETECTED NOT DETECTED Final   Candida tropicalis NOT DETECTED NOT  DETECTED Final   Cryptococcus neoformans/gattii NOT DETECTED NOT DETECTED Final   Meth resistant mecA/C and MREJ DETECTED (A) NOT DETECTED Final    Comment: CRITICAL RESULT CALLED TO, READ BACK BY AND VERIFIED WITH: PHARMD J [facility] 11/10/2023 @ 0159 BY AB Performed at [facility], [address], [place], [place] [address]   Culture, blood (Routine X 2) w Reflex to ID Panel     Status: None (Preliminary result)   Collection Time: 11/11/23 12:33 PM   Specimen: BLOOD RIGHT ARM  Result Value Ref Range Status   Specimen Description BLOOD RIGHT ARM  Final   Special Requests   Final    BOTTLES DRAWN AEROBIC AND ANAEROBIC Blood Culture adequate volume   Culture   Final    NO GROWTH < 24 HOURS Performed at [facility], [address] [place], [place] [address]    Report Status PENDING  Incomplete  Culture, blood (Routine X 2) w Reflex to ID Panel     Status: None (Preliminary result)   Collection Time: 11/11/23 12:33 PM   Specimen: BLOOD RIGHT HAND  Result Value Ref Range Status   Specimen Description BLOOD RIGHT HAND  Final   Special Requests   Final    BOTTLES DRAWN AEROBIC AND ANAEROBIC Blood Culture adequate volume   Culture   Final    NO GROWTH < 24 HOURS Performed at [facility], [address] [place], [place] [address]    Report Status PENDING  Incomplete     Radiology Studies: EP STUDY Result Date: 11/12/2023 See surgical note for result.    Scheduled Meds:  [MAR Hold] amiodarone   200 mg Oral Daily   [MAR Hold] apixaban   5 mg Oral BID   [MAR Hold] busPIRone   7.5 mg Oral BID   [MAR Hold] Chlorhexidine  Gluconate Cloth  6 each Topical Daily   [MAR Hold] feeding supplement (GLUCERNA SHAKE)  237 mL Oral TID BM   [MAR Hold] ferrous sulfate   325 mg Oral Daily   [MAR Hold] hydrALAZINE   25 mg Oral TID   [MAR Hold] insulin  aspart  0-9 Units Subcutaneous TID WC   [MAR Hold] isosorbide  dinitrate  10 mg Oral TID   [MAR Hold] lidocaine   1 patch Transdermal  Q24H   [MAR Hold] nicotine   21 mg Transdermal Daily   [MAR Hold] polyethylene glycol  17 g Oral Daily   [MAR Hold] rosuvastatin   20 mg Oral Daily   [MAR Hold] sodium chloride  flush  10-40 mL Intracatheter Q12H   [MAR Hold] sodium chloride  flush  3 mL Intravenous Q12H   [MAR Hold] venlafaxine  XR  75 mg Oral Daily   Continuous Infusions:  sodium chloride  20 mL/hr at 11/12/23 1212   [MAR Hold] DAPTOmycin  500 mg (11/11/23 1501)     LOS: 8 days    Time spent:    Sigurd Pac, MD [facility]   11/12/2023, 12:54 PM

## 2023-11-12 NOTE — Anesthesia Postprocedure Evaluation (Signed)
 Anesthesia Post Note  Patient: Maria Arroyo  Procedure(s) Performed: TRANSESOPHAGEAL ECHOCARDIOGRAM     Patient location during evaluation: PACU Anesthesia Type: MAC Level of consciousness: awake and alert Pain management: pain level controlled Vital Signs Assessment: post-procedure vital signs reviewed and stable Respiratory status: spontaneous breathing, nonlabored ventilation, respiratory function stable and patient connected to nasal cannula oxygen Cardiovascular status: stable and blood pressure returned to baseline Postop Assessment: no apparent nausea or vomiting Anesthetic complications: no   No notable events documented.  Last Vitals:  Vitals:   11/12/23 1325 11/12/23 1335  BP: (!) 111/51 (!) 110/55  Pulse: 72 76  Resp: 13   Temp:    SpO2: 95% 90%    Last Pain:  Vitals:   11/12/23 1335  TempSrc:   PainSc: 0-No pain                 Cordella P Sircharles Holzheimer

## 2023-11-12 NOTE — Progress Notes (Signed)
 Progress Note  Patient Name: Maria Arroyo Date of Encounter: 11/12/2023  Primary Cardiologist:   Peter Swaziland, MD   Subjective   Much more awake today.  Complaints of less rib and back pain.  No acute SOB.  Inpatient Medications    Scheduled Meds:  amiodarone   200 mg Oral Daily   apixaban   5 mg Oral BID   busPIRone   7.5 mg Oral BID   Chlorhexidine  Gluconate Cloth  6 each Topical Daily   feeding supplement (GLUCERNA SHAKE)  237 mL Oral TID BM   ferrous sulfate   325 mg Oral Daily   hydrALAZINE   25 mg Oral TID   insulin  aspart  0-9 Units Subcutaneous TID WC   isosorbide  dinitrate  10 mg Oral TID   lidocaine   1 patch Transdermal Q24H   nicotine   21 mg Transdermal Daily   polyethylene glycol  17 g Oral Daily   rosuvastatin   20 mg Oral Daily   sodium chloride  flush  10-40 mL Intracatheter Q12H   sodium chloride  flush  3 mL Intravenous Q12H   venlafaxine  XR  75 mg Oral Daily   Continuous Infusions:  sodium chloride      DAPTOmycin  500 mg (11/11/23 1501)   PRN Meds: acetaminophen  **OR** acetaminophen , bisacodyl , bisacodyl , ipratropium-albuterol , melatonin, mouth rinse, sodium chloride  flush   Vital Signs    Vitals:   11/11/23 2109 11/12/23 0009 11/12/23 0442 11/12/23 0709  BP:  123/89 (!) 115/93 127/75  Pulse:    82  Resp: (!) 36 20 19 (!) 23  Temp:  98.2 F (36.8 C) 98.6 F (37 C) 98.4 F (36.9 C)  TempSrc:  Oral Oral Oral  SpO2:  91% 93%   Weight:   65.6 kg   Height:        Intake/Output Summary (Last 24 hours) at 11/12/2023 0849 Last data filed at 11/12/2023 0447 Gross per 24 hour  Intake 173 ml  Output 250 ml  Net -77 ml   Filed Weights   11/10/23 0425 11/11/23 0507 11/12/23 0442  Weight: 62.9 kg 63 kg 65.6 kg    Telemetry    NSR, run of wide complex tach likely SVT with RBBB - Personally Reviewed  ECG    NA - Personally Reviewed  Physical Exam   GEN: No  acute distress.   Neck: No  JVD Cardiac: RRR, no murmurs, rubs, or  gallops.  Respiratory:      Decreased breath dependent. No wheezing GI: Soft, nontender, non-distended, normal bowel sounds  MS:  Mild ankle edema; No deformity. Neuro:   Nonfocal  Psych: Oriented and appropriate     Labs    Chemistry Recent Labs  Lab 11/08/23 0556 11/09/23 0211 11/10/23 0442 11/11/23 0836 11/12/23 0225  NA 135 135 134* 133* 133*  K 3.3* 3.1* 5.2* 4.4 4.0  CL 76* 82* 87* 85* 86*  CO2 44* 39* 32 32 31  GLUCOSE 157* 149* 222* 105* 140*  BUN 51* 50* 62* 76* 82*  CREATININE 2.08* 1.91* 2.21* 2.22* 2.32*  CALCIUM  9.0 9.0 8.9 9.0 8.9  PROT 6.3* 6.0* 6.3*  --   --   ALBUMIN 2.4* 2.3* 2.2*  --   --   AST 37 35 161*  --   --   ALT 112* 78* 137*  --   --   ALKPHOS 124 110 145*  --   --   BILITOT 0.4 0.6 0.9  --   --   GFRNONAA 25* 28* 23* 23* 22*  ANIONGAP 15 14 15  16* 16*     Hematology Recent Labs  Lab 11/10/23 0442 11/11/23 0836 11/12/23 0225  WBC 15.9* 14.9* 15.5*  RBC 3.48* 3.41* 3.39*  HGB 9.1* 8.8* 8.6*  HCT 29.9* 29.2* 28.4*  MCV 85.9 85.6 83.8  MCH 26.1 25.8* 25.4*  MCHC 30.4 30.1 30.3  RDW 17.9* 17.8* 17.9*  PLT 273 283 314    Cardiac EnzymesNo results for input(s): TROPONINI in the last 168 hours. No results for input(s): TROPIPOC in the last 168 hours.   BNP No results for input(s): BNP, PROBNP in the last 168 hours.    DDimer No results for input(s): DDIMER in the last 168 hours.   Radiology    No results found.    Cardiac Studies   Echo 11/04/23   1. Left ventricular ejection fraction, by estimation, is 30 to 35%. The  left ventricle has moderately decreased function. Left ventricular  endocardial border not optimally defined to evaluate regional wall motion.  The left ventricular internal cavity  size was mildly dilated. Left ventricular diastolic parameters are  consistent with Grade II diastolic dysfunction (pseudonormalization).  Elevated left atrial pressure.   2. Right ventricular systolic function is  moderately reduced. The right  ventricular size is moderately enlarged.   3. Left atrial size was severely dilated.   4. Right atrial size was severely dilated.   5. The mitral valve is degenerative. Mild to moderate mitral valve  regurgitation. No evidence of mitral stenosis. Moderate mitral annular  calcification.   6. Tricuspid valve regurgitation is moderate.   7. The aortic valve is calcified. There is moderate calcification of the  aortic valve. There is moderate thickening of the aortic valve. Aortic  valve regurgitation is not visualized. Aortic valve  sclerosis/calcification is present, without any evidence  of aortic stenosis.   8. The inferior vena cava is normal in size with greater than 50%  respiratory variability, suggesting right atrial pressure of 3 mmHg.   9. Very poor acoustical windows limit ability to detect focal wall motion  abnormalities. The estimated EF may be underestimated due to poor images  even with definity  contrast.   Patient Profile     73 y.o. female with CAD, HTN, HLD, PAF, chronic diastolic heart failure, chronic respiratory failure, DM II, Squamous cell carcinoma s/p R lobectomy 20', tobacco abuse and depression. AHF team to see for acute combined systolic and diastolic heart failure and suspected low output HF.   Assessment & Plan    Acute combined systolic and diastolic heart failure:   Possible ischemic etiology.  Note the EF is 30 - 35% as above.  EF had appeared to be 55% in 2020.    No cath planned.  Net negative 10.7 liters.  Off inotropic agents.  Continue hydral/nitrates.   Assess daily for need to resume diuretic.  Hold today.   Hypokalemia:   Potassium is normalized. Follow.   CAD:    No active ischemia.  Medical management.    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  However, her volume needs to be assessed daily as she might  need to resume.     Paroxysmal A-fib:     On DOAC.  Continue amio PO.  She did require IV this admission and did not do well while in atrial fib.  Try to maintain NSR.  COPD/history of lobectomy for lung cancer:  Management per primary team.   DM/HL :   Continue current therapy.   Confusion:    Improved.    Hypercapnea:   Diamox  stopped yesterday.  This was corrected.   Anemia:   Hemoglobin  unchanged.  No active bleeding.   Leukocytosis:   MRSA.  ID has requested a TEE.  Patient and daughter are aware and agree to proceed.   .   For questions or updates, please contact [facility] Please consult [email or web address] for contact info under Cardiology/STEMI.   Signed, Lynwood Schilling, MD  11/12/2023, 8:49 AM

## 2023-11-12 NOTE — Progress Notes (Signed)
   11/12/23 2114  BiPAP/CPAP/SIPAP  Reason BIPAP/CPAP not in use Non-compliant (Pt's husband stated she did not wear)  BiPAP/CPAP /SiPAP Vitals  Resp 18  MEWS Score/Color  MEWS Score 0  MEWS Score Color Green

## 2023-11-12 NOTE — TOC Progression Note (Signed)
 Transition of Care [facility]) - Progression Note    Patient Details  Name: Maria Arroyo MRN: [id number] Date of Birth: 11-23-1950  Transition of [facility] CM/SW Contact  Luise JAYSON Pan, [place] Phone Number: 11/12/2023, 2:54 PM  Clinical Narrative:   CSW met patient daughter, Olam, at bedside and provided medicare.gov ratings for accepting SNFs. Olam will review and call CSW with a decision.   CSW will continue to follow.    Expected Discharge Plan: Skilled Nursing Facility Barriers to Discharge: Continued Medical Work up, SNF Pending bed offer, English as a second language teacher               Expected Discharge Plan and Services   Discharge Planning Services: CM Consult Post Acute Care Choice: Home Health Living arrangements for the past 2 months: Single Family Home                           HH Arranged: RN, PT [facility] Agency: [facility] Date [facility] Agency Contacted: 11/05/23 Time HH Agency Contacted: [phone] Representative spoke with at [facility] Agency: Channing Ee   Social Drivers of Health (SDOH) Interventions SDOH Screenings   Food Insecurity: No Food Insecurity (11/04/2023)  Recent Concern: Food Insecurity - Food Insecurity Present (08/11/2023)  Housing: Low Risk  (11/04/2023)  Transportation Needs: No Transportation Needs (11/04/2023)  Utilities: Not At Risk (11/04/2023)  Depression (PHQ2-9): Low Risk  (03/17/2018)  Financial Resource Strain: Low Risk  (05/12/2023)   Received from [facility]  Physical Activity: Unknown (03/10/2023)   Received from [facility]  Social Connections: Moderately Isolated (11/04/2023)  Stress: No Stress Concern Present (03/10/2023)   Received from [facility]  Tobacco Use: High Risk (11/09/2023)   Received from [facility]    Readmission Risk Interventions    07/06/2023   11:09 AM  Readmission Risk Prevention Plan  Transportation Screening Complete  HRI or Home Care Consult Complete  Social Work Consult for Recovery  Care Planning/Counseling Complete  Palliative Care Screening Not Applicable  Medication Review Oceanographer) Referral to Pharmacy

## 2023-11-12 NOTE — H&P (View-Only) (Signed)
 Progress Note  Patient Name: Maria Arroyo Date of Encounter: 11/12/2023  Primary Cardiologist:   Peter Swaziland, MD   Subjective   Much more awake today.  Complaints of less rib and back pain.  No acute SOB.  Inpatient Medications    Scheduled Meds:  amiodarone   200 mg Oral Daily   apixaban   5 mg Oral BID   busPIRone   7.5 mg Oral BID   Chlorhexidine  Gluconate Cloth  6 each Topical Daily   feeding supplement (GLUCERNA SHAKE)  237 mL Oral TID BM   ferrous sulfate   325 mg Oral Daily   hydrALAZINE   25 mg Oral TID   insulin  aspart  0-9 Units Subcutaneous TID WC   isosorbide  dinitrate  10 mg Oral TID   lidocaine   1 patch Transdermal Q24H   nicotine   21 mg Transdermal Daily   polyethylene glycol  17 g Oral Daily   rosuvastatin   20 mg Oral Daily   sodium chloride  flush  10-40 mL Intracatheter Q12H   sodium chloride  flush  3 mL Intravenous Q12H   venlafaxine  XR  75 mg Oral Daily   Continuous Infusions:  sodium chloride      DAPTOmycin  500 mg (11/11/23 1501)   PRN Meds: acetaminophen  **OR** acetaminophen , bisacodyl , bisacodyl , ipratropium-albuterol , melatonin, mouth rinse, sodium chloride  flush   Vital Signs    Vitals:   11/11/23 2109 11/12/23 0009 11/12/23 0442 11/12/23 0709  BP:  123/89 (!) 115/93 127/75  Pulse:    82  Resp: (!) 36 20 19 (!) 23  Temp:  98.2 F (36.8 C) 98.6 F (37 C) 98.4 F (36.9 C)  TempSrc:  Oral Oral Oral  SpO2:  91% 93%   Weight:   65.6 kg   Height:        Intake/Output Summary (Last 24 hours) at 11/12/2023 0849 Last data filed at 11/12/2023 0447 Gross per 24 hour  Intake 173 ml  Output 250 ml  Net -77 ml   Filed Weights   11/10/23 0425 11/11/23 0507 11/12/23 0442  Weight: 62.9 kg 63 kg 65.6 kg    Telemetry    NSR, run of wide complex tach likely SVT with RBBB - Personally Reviewed  ECG    NA - Personally Reviewed  Physical Exam   GEN: No  acute distress.   Neck: No  JVD Cardiac: RRR, no murmurs, rubs, or  gallops.  Respiratory:      Decreased breath dependent. No wheezing GI: Soft, nontender, non-distended, normal bowel sounds  MS:  Mild ankle edema; No deformity. Neuro:   Nonfocal  Psych: Oriented and appropriate     Labs    Chemistry Recent Labs  Lab 11/08/23 0556 11/09/23 0211 11/10/23 0442 11/11/23 0836 11/12/23 0225  NA 135 135 134* 133* 133*  K 3.3* 3.1* 5.2* 4.4 4.0  CL 76* 82* 87* 85* 86*  CO2 44* 39* 32 32 31  GLUCOSE 157* 149* 222* 105* 140*  BUN 51* 50* 62* 76* 82*  CREATININE 2.08* 1.91* 2.21* 2.22* 2.32*  CALCIUM  9.0 9.0 8.9 9.0 8.9  PROT 6.3* 6.0* 6.3*  --   --   ALBUMIN 2.4* 2.3* 2.2*  --   --   AST 37 35 161*  --   --   ALT 112* 78* 137*  --   --   ALKPHOS 124 110 145*  --   --   BILITOT 0.4 0.6 0.9  --   --   GFRNONAA 25* 28* 23* 23* 22*  ANIONGAP 15 14 15  16* 16*     Hematology Recent Labs  Lab 11/10/23 0442 11/11/23 0836 11/12/23 0225  WBC 15.9* 14.9* 15.5*  RBC 3.48* 3.41* 3.39*  HGB 9.1* 8.8* 8.6*  HCT 29.9* 29.2* 28.4*  MCV 85.9 85.6 83.8  MCH 26.1 25.8* 25.4*  MCHC 30.4 30.1 30.3  RDW 17.9* 17.8* 17.9*  PLT 273 283 314    Cardiac EnzymesNo results for input(s): TROPONINI in the last 168 hours. No results for input(s): TROPIPOC in the last 168 hours.   BNP No results for input(s): BNP, PROBNP in the last 168 hours.    DDimer No results for input(s): DDIMER in the last 168 hours.   Radiology    No results found.    Cardiac Studies   Echo 11/04/23   1. Left ventricular ejection fraction, by estimation, is 30 to 35%. The  left ventricle has moderately decreased function. Left ventricular  endocardial border not optimally defined to evaluate regional wall motion.  The left ventricular internal cavity  size was mildly dilated. Left ventricular diastolic parameters are  consistent with Grade II diastolic dysfunction (pseudonormalization).  Elevated left atrial pressure.   2. Right ventricular systolic function is  moderately reduced. The right  ventricular size is moderately enlarged.   3. Left atrial size was severely dilated.   4. Right atrial size was severely dilated.   5. The mitral valve is degenerative. Mild to moderate mitral valve  regurgitation. No evidence of mitral stenosis. Moderate mitral annular  calcification.   6. Tricuspid valve regurgitation is moderate.   7. The aortic valve is calcified. There is moderate calcification of the  aortic valve. There is moderate thickening of the aortic valve. Aortic  valve regurgitation is not visualized. Aortic valve  sclerosis/calcification is present, without any evidence  of aortic stenosis.   8. The inferior vena cava is normal in size with greater than 50%  respiratory variability, suggesting right atrial pressure of 3 mmHg.   9. Very poor acoustical windows limit ability to detect focal wall motion  abnormalities. The estimated EF may be underestimated due to poor images  even with definity  contrast.   Patient Profile     73 y.o. female with CAD, HTN, HLD, PAF, chronic diastolic heart failure, chronic respiratory failure, DM II, Squamous cell carcinoma s/p R lobectomy 20', tobacco abuse and depression. AHF team to see for acute combined systolic and diastolic heart failure and suspected low output HF.   Assessment & Plan    Acute combined systolic and diastolic heart failure:   Possible ischemic etiology.  Note the EF is 30 - 35% as above.  EF had appeared to be 55% in 2020.    No cath planned.  Net negative 10.7 liters.  Off inotropic agents.  Continue hydral/nitrates.   Assess daily for need to resume diuretic.  Hold today.   Hypokalemia:   Potassium is normalized. Follow.   CAD:    No active ischemia.  Medical management.    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  However, her volume needs to be assessed daily as she might  need to resume.     Paroxysmal A-fib:     On DOAC.  Continue amio PO.  She did require IV this admission and did not do well while in atrial fib.  Try to maintain NSR.  COPD/history of lobectomy for lung cancer:  Management per primary team.   DM/HL :   Continue current therapy.   Confusion:    Improved.    Hypercapnea:   Diamox  stopped yesterday.  This was corrected.   Anemia:   Hemoglobin  unchanged.  No active bleeding.   Leukocytosis:   MRSA.  ID has requested a TEE.  Patient and daughter are aware and agree to proceed.   .   For questions or updates, please contact [facility] Please consult [email or web address] for contact info under Cardiology/STEMI.   Signed, Lynwood Schilling, MD  11/12/2023, 8:49 AM

## 2023-11-12 NOTE — Interval H&P Note (Signed)
 History and Physical Interval Note:  11/12/2023 12:18 PM  Maria Arroyo  has presented today for surgery, with the diagnosis of bacteremia.  The various methods of treatment have been discussed with the patient and family. After consideration of risks, benefits and other options for treatment, the patient has consented to  Procedure(s): TRANSESOPHAGEAL ECHOCARDIOGRAM (N/A) as a surgical intervention.  The patient's history has been reviewed, patient examined, no change in status, stable for surgery.  I have reviewed the patient's chart and labs.  Questions were answered to the patient's satisfaction.     Lonni LITTIE Nanas

## 2023-11-12 NOTE — Anesthesia Preprocedure Evaluation (Addendum)
 Anesthesia Evaluation  Patient identified by MRN, date of birth, ID band Patient confused    Reviewed: Allergy & Precautions, NPO status , Patient's Chart, lab work & pertinent test results  Airway Mallampati: II  TM Distance: >3 FB Neck ROM: Full    Dental no notable dental hx. (+) Poor Dentition   Pulmonary COPD, Current Smoker and Patient abstained from smoking.    + wheezing      Cardiovascular hypertension, Pt. on medications + CAD and + Past MI   Rhythm:Regular Rate:Normal     Neuro/Psych   Anxiety Depression       GI/Hepatic negative GI ROS, Neg liver ROS,,,  Endo/Other  diabetes    Renal/GU   negative genitourinary   Musculoskeletal  (+) Arthritis ,    Abdominal Normal abdominal exam  (+)   Peds  Hematology  (+) Blood dyscrasia, anemia   Anesthesia Other Findings   Reproductive/Obstetrics                              Anesthesia Physical Anesthesia Plan  ASA: 3  Anesthesia Plan: MAC   Post-op Pain Management:    Induction: Intravenous  PONV Risk Score and Plan: 1 and Propofol  infusion and Treatment may vary due to age or medical condition  Airway Management Planned: Simple Face Mask and Nasal Cannula  Additional Equipment: None  Intra-op Plan:   Post-operative Plan:   Informed Consent: I have reviewed the patients History and Physical, chart, labs and discussed the procedure including the risks, benefits and alternatives for the proposed anesthesia with the patient or authorized representative who has indicated his/her understanding and acceptance.     Dental advisory given and Consent reviewed with POA  Plan Discussed with: CRNA  Anesthesia Plan Comments:          Anesthesia Quick Evaluation

## 2023-11-12 NOTE — Progress Notes (Signed)
  Echocardiogram Echocardiogram Transesophageal has been performed.  Maria Arroyo 11/12/2023, 1:32 PM

## 2023-11-12 NOTE — Progress Notes (Signed)
 ID PROGRESS NOTE  Patient remains afebrile, underwent TEE which showed no signs of vegetation  A/P: MRSA hospital acquired bacteremia, likely CLABSI as source of bacteremia  - plan to treat for 14 days using 8/14 as day 1. Continue on daptomycin . Once ready for discharge, may consider to finishing course with linezolid  Lynnzie Blackson B. Luiz MD MPH [facility] [phone]

## 2023-11-13 ENCOUNTER — Inpatient Hospital Stay (HOSPITAL_COMMUNITY)

## 2023-11-13 DIAGNOSIS — I5021 Acute systolic (congestive) heart failure: Secondary | ICD-10-CM | POA: Diagnosis not present

## 2023-11-13 DIAGNOSIS — N179 Acute kidney failure, unspecified: Secondary | ICD-10-CM | POA: Diagnosis not present

## 2023-11-13 DIAGNOSIS — I480 Paroxysmal atrial fibrillation: Secondary | ICD-10-CM | POA: Diagnosis not present

## 2023-11-13 DIAGNOSIS — J9601 Acute respiratory failure with hypoxia: Secondary | ICD-10-CM | POA: Diagnosis not present

## 2023-11-13 LAB — CBC
HCT: 26.1 % — ABNORMAL LOW (ref 36.0–46.0)
Hemoglobin: 8 g/dL — ABNORMAL LOW (ref 12.0–15.0)
MCH: 25.6 pg — ABNORMAL LOW (ref 26.0–34.0)
MCHC: 30.7 g/dL (ref 30.0–36.0)
MCV: 83.4 fL (ref 80.0–100.0)
Platelets: 320 K/uL (ref 150–400)
RBC: 3.13 MIL/uL — ABNORMAL LOW (ref 3.87–5.11)
RDW: 17.9 % — ABNORMAL HIGH (ref 11.5–15.5)
WBC: 15.2 K/uL — ABNORMAL HIGH (ref 4.0–10.5)
nRBC: 0.4 % — ABNORMAL HIGH (ref 0.0–0.2)

## 2023-11-13 LAB — BASIC METABOLIC PANEL WITH GFR
Anion gap: 16 — ABNORMAL HIGH (ref 5–15)
BUN: 99 mg/dL — ABNORMAL HIGH (ref 8–23)
CO2: 30 mmol/L (ref 22–32)
Calcium: 8.6 mg/dL — ABNORMAL LOW (ref 8.9–10.3)
Chloride: 85 mmol/L — ABNORMAL LOW (ref 98–111)
Creatinine, Ser: 2.82 mg/dL — ABNORMAL HIGH (ref 0.44–1.00)
GFR, Estimated: 17 mL/min — ABNORMAL LOW (ref >60)
Glucose, Bld: 130 mg/dL — ABNORMAL HIGH (ref 70–99)
Potassium: 4.2 mmol/L (ref 3.5–5.1)
Sodium: 131 mmol/L — ABNORMAL LOW (ref 135–145)

## 2023-11-13 LAB — GLUCOSE, CAPILLARY
Glucose-Capillary: 131 mg/dL — ABNORMAL HIGH (ref 70–99)
Glucose-Capillary: 128 mg/dL — ABNORMAL HIGH (ref 70–99)
Glucose-Capillary: 157 mg/dL — ABNORMAL HIGH (ref 70–99)
Glucose-Capillary: 133 mg/dL — ABNORMAL HIGH (ref 70–99)

## 2023-11-13 MED ORDER — HYDRALAZINE HCL 10 MG PO TABS
10.0000 mg | ORAL_TABLET | Freq: Three times a day (TID) | ORAL | Status: DC
  Administered 2023-11-13 (×2): 10 mg via ORAL
  Filled 2023-11-13 (×2): qty 1

## 2023-11-13 MED ORDER — BUSPIRONE HCL 5 MG PO TABS: 5.0000 mg | ORAL_TABLET | Freq: Two times a day (BID) | ORAL | Status: DC

## 2023-11-13 MED ORDER — SODIUM CHLORIDE 0.9 % IV SOLN: INTRAVENOUS | Status: DC

## 2023-11-13 MED ORDER — ISOSORBIDE MONONITRATE ER 30 MG PO TB24
30.0000 mg | ORAL_TABLET | Freq: Every day | ORAL | Status: DC
  Administered 2023-11-13: 30 mg via ORAL
  Filled 2023-11-13: qty 1

## 2023-11-13 MED ORDER — METOPROLOL SUCCINATE ER 25 MG PO TB24
12.5000 mg | ORAL_TABLET | Freq: Every day | ORAL | Status: DC
  Administered 2023-11-13: 12.5 mg via ORAL
  Filled 2023-11-13: qty 1

## 2023-11-13 NOTE — Progress Notes (Addendum)
 PROGRESS NOTE    Maria Arroyo  FMW:[id number] DOB: September 01, 1950 DOA: 11/04/2023 PCP: Aminta Lamar Hamilton, MD  73/F with history of chronic back pain, paroxysmal A-fib, hypertension, chronic diastolic CHF, COPD, chronic respiratory failure on 3 L home O2, lung cancer with right lobectomy in 2020, type 2 diabetes, depression, tobacco use presented to the ED with worsening dyspnea and CHF. - Diagnosed with new onset CHF, troponin 1139, EF down to 30-35% with BiV failure, cardiology following, briefly on milrinone , subsequently discontinued 8/12 -8/11 onwards became lethargic with leukocytosis, - 11/09/11 early a.m. blood cultures growing MRSA> started vancomycin  - 8/13, ID consult Vanc changed to daptomycin , PICC line removed, persistent encephalopathy - 8/15 onwards mental status slowly improving 8/15-TEE negative for endocarditis  Subjective: - Still with some confusion, drowsy this morning  Assessment and Plan:  MRSA bacteremia Sepsis, not POA - Developed low-grade fever, leukocytosis and encephalopathy since admission, blood cultures growing MRSA,, appreciate ID input, vancomycin  changed to daptomycin  - PICC line removed 8/13 - TEE 8/15 negative for endocarditis, ID recommended 2-week course of daptomycin , could change to linezolid when ready for DC - Repeat blood cultures negative so far - Will discuss with ID regarding additional imaging  Encephalopathy - Suspect primarily secondary to sepsis and MRSA bacteremia - Much improved, decreased BuSpar  and Effexor  dose  Acute on chronic systolic and diastolic CHF New biventricular failure -Echo this admission noted EF down to 30-35%, moderately reduced RV, severely elevated dilated BAE - Concern for ischemic cardiomyopathy, troponin peaked at 1139, prior history of LAD stenting -Was on IV milrinone  for approximately 48 hours, now off - Deferring ischemic eval at this time in the setting of AKI, encephalopathy etc., cards  following - Diuretics on hold at this time, continue low-dose Toprol , hydralazine  and nitrates  NSTEMI - Troponin peaked at 1139, then trending down - Conservative management at this time in the setting of sepsis, encephalopathy, AKI etc.  Acute kidney injury on CKD 4 -baseline creat 1.7-2.2 range - Worsening creatinine in the setting of sepsis, likely ATN, diuretics have been on hold - Creatinine with slight worsening today, add IV fluids, check renal ultrasound - Cut down hydralazine  dose, avoid hypotension  Multiple rib fractures - Family reports multiple recent falls - Needs pulmonary toilet, add nebs, incentive spirometry when more awake  Paroxysmal atrial fibrillation - Known history of A-fib, continue Eliquis  and amiodarone   Hypokalemia Repleted  Transaminitis - Likely from congestion of HFrEF.  LFTs showing improvement this morning.  Will recheck in AM.   Diabetes mellitus - CBGs improving, continue Semglee    Iron  deficiency anemia - No overt bleeding.  Monitor   Paroxysmal atrial fibrillation - Now off IV amiodarone , apixaban    History of squamous cell carcinoma of the right lung - S/p right lobectomy in 2020.  COPD/chronic respiratory failure On 3 L home O2 at baseline   Tobacco abuse - Still smokes.Nicotine  patch was offered.   Obesity class I - BMI nearly 32.  Encouraged lifestyle modification and weight loss.   DVT prophylaxis: apixaban  Code Status: DNR Family Communication: No family at bedside today, will update daughter Disposition Plan: TBD  Objective: Vitals:   11/12/23 2114 11/13/23 0043 11/13/23 0448 11/13/23 0800  BP:  125/64 124/79 115/76  Pulse:  73 70 83  Resp: 18 20 20 16   Temp:  98.7 F (37.1 C) 98.1 F (36.7 C) 98.1 F (36.7 C)  TempSrc:  Axillary Oral Oral  SpO2:  98% 98% 99%  Weight:   65.4  kg   Height:        Intake/Output Summary (Last 24 hours) at 11/13/2023 1110 Last data filed at 11/13/2023 0448 Gross per 24 hour   Intake 170 ml  Output 225 ml  Net -55 ml   Filed Weights   11/11/23 0507 11/12/23 0442 11/13/23 0448  Weight: 63 kg 65.6 kg 65.4 kg    Examination:  General exam: Somnolent arousable, oriented to self and place, mild confusion, ill-appearing HEENT: No JVD CVS: S1-S2, regular rhythm Lungs: Few scattered rhonchi Abdomen: Soft, nontender, bowel sounds present Remedies: 1+ edema Skin: As above Psychiatry: Flat affect    Data Reviewed:   CBC: Recent Labs  Lab 11/09/23 0211 11/10/23 0442 11/11/23 0836 11/12/23 0225 11/13/23 0227  WBC 19.6* 15.9* 14.9* 15.5* 15.2*  HGB 8.9* 9.1* 8.8* 8.6* 8.0*  HCT 29.7* 29.9* 29.2* 28.4* 26.1*  MCV 85.8 85.9 85.6 83.8 83.4  PLT 276 273 283 314 320   Basic Metabolic Panel: Recent Labs  Lab 11/07/23 0535 11/08/23 0556 11/09/23 0211 11/10/23 0442 11/11/23 0836 11/12/23 0225 11/13/23 0227  NA 134* 135 135 134* 133* 133* 131*  K 3.7 3.3* 3.1* 5.2* 4.4 4.0 4.2  CL 79* 76* 82* 87* 85* 86* 85*  CO2 43* 44* 39* 32 32 31 30  GLUCOSE 122* 157* 149* 222* 105* 140* 130*  BUN 54* 51* 50* 62* 76* 82* 99*  CREATININE 2.18* 2.08* 1.91* 2.21* 2.22* 2.32* 2.82*  CALCIUM  8.7* 9.0 9.0 8.9 9.0 8.9 8.6*  MG 2.0 2.0 2.2 2.2  --   --   --   PHOS  --   --   --  4.8*  --   --   --    GFR: Estimated Creatinine Clearance: 14.6 mL/min (A) (by C-G formula based on SCr of 2.82 mg/dL (H)). Liver Function Tests: Recent Labs  Lab 11/07/23 0535 11/08/23 0556 11/09/23 0211 11/10/23 0442  AST 59* 37 35 161*  ALT 167* 112* 78* 137*  ALKPHOS 151* 124 110 145*  BILITOT 0.8 0.4 0.6 0.9  PROT 5.9* 6.3* 6.0* 6.3*  ALBUMIN 2.5* 2.4* 2.3* 2.2*   No results for input(s): LIPASE, AMYLASE in the last 168 hours. Recent Labs  Lab 11/08/23 1130  AMMONIA 41*   Coagulation Profile: No results for input(s): INR, PROTIME in the last 168 hours. Cardiac Enzymes: Recent Labs  Lab 11/11/23 0836  CKTOTAL 120   BNP (last 3 results) No results for  input(s): PROBNP in the last 8760 hours. HbA1C: No results for input(s): HGBA1C in the last 72 hours. CBG: Recent Labs  Lab 11/12/23 0603 11/12/23 1106 11/12/23 1611 11/12/23 2109 11/13/23 0634  GLUCAP 149* 120* 119* 173* 131*   Lipid Profile: No results for input(s): CHOL, HDL, LDLCALC, TRIG, CHOLHDL, LDLDIRECT in the last 72 hours. Thyroid Function Tests: No results for input(s): TSH, T4TOTAL, FREET4, T3FREE, THYROIDAB in the last 72 hours. Anemia Panel: No results for input(s): VITAMINB12, FOLATE, FERRITIN, TIBC, IRON , RETICCTPCT in the last 72 hours. Urine analysis:    Component Value Date/Time   COLORURINE YELLOW 11/09/2023 1155   APPEARANCEUR HAZY (A) 11/09/2023 1155   LABSPEC 1.015 11/09/2023 1155   PHURINE 8.0 11/09/2023 1155   GLUCOSEU NEGATIVE 11/09/2023 1155   HGBUR NEGATIVE 11/09/2023 1155   BILIRUBINUR NEGATIVE 11/09/2023 1155   KETONESUR NEGATIVE 11/09/2023 1155   PROTEINUR 30 (A) 11/09/2023 1155   NITRITE POSITIVE (A) 11/09/2023 1155   LEUKOCYTESUR NEGATIVE 11/09/2023 1155   Sepsis Labs: @LABRCNTIP (procalcitonin:4,lacticidven:4)  ) Recent Results (  from the past 240 hours)  MIC (1 Drug)-Blood culture; 11/09/2023; BLOOD RIGHT ARM; MRSA; Daptomycin      Status: Abnormal   Collection Time: 11/09/23  8:25 AM   Specimen: BLOOD RIGHT ARM  Result Value Ref Range Status   Min Inhibitory Conc (1 Drug) Preliminary report (A)  Final    Comment: (NOTE) Performed At: [facility] [address] [place], [place] [id number] Jennette Shorter MD Ey:[phone]    Source [phone]  Final    Comment: Performed at [facility], [address] [place], [place] [address]  MIC Result     Status: Abnormal   Collection Time: 11/09/23  8:25 AM  Result Value Ref Range Status   Result 1 (MIC) Comment (A)  Final    Comment: (NOTE) Methicillin - resistant Staphylococcus aureus Identification performed by account, not confirmed by  this laboratory. DAPTOMYCIN  Performed At: [facility] [address] [place], [place] [id number] Jennette Shorter MD Ey:[phone]   Resp panel by RT-PCR (RSV, Flu A&B, Covid) Anterior Nasal Swab     Status: None   Collection Time: 11/09/23 10:19 AM   Specimen: Anterior Nasal Swab  Result Value Ref Range Status   SARS Coronavirus 2 by RT PCR NEGATIVE NEGATIVE Final   Influenza A by PCR NEGATIVE NEGATIVE Final   Influenza B by PCR NEGATIVE NEGATIVE Final    Comment: (NOTE) The Xpert Xpress SARS-CoV-2/FLU/RSV plus assay is intended as an aid in the diagnosis of influenza from Nasopharyngeal swab specimens and should not be used as a sole basis for treatment. Nasal washings and aspirates are unacceptable for Xpert Xpress SARS-CoV-2/FLU/RSV testing.  Fact Sheet for Patients: BloggerCourse.com  Fact Sheet for Healthcare Providers: SeriousBroker.it  This test is not yet approved or cleared by the [place]  FDA and has been authorized for detection and/or diagnosis of SARS-CoV-2 by FDA under an Emergency Use Authorization (EUA). This EUA will remain in effect (meaning this test can be used) for the duration of the COVID-19 declaration under Section 564(b)(1) of the Act, 21 U.S.C. section 360bbb-3(b)(1), unless the authorization is terminated or revoked.     Resp Syncytial Virus by PCR NEGATIVE NEGATIVE Final    Comment: (NOTE) Fact Sheet for Patients: BloggerCourse.com  Fact Sheet for Healthcare Providers: SeriousBroker.it  This test is not yet approved or cleared by the [place]  FDA and has been authorized for detection and/or diagnosis of SARS-CoV-2 by FDA under an Emergency Use Authorization (EUA). This EUA will remain in effect (meaning this test can be used) for the duration of the COVID-19 declaration under Section 564(b)(1) of the Act, 21 U.S.C. section  360bbb-3(b)(1), unless the authorization is terminated or revoked.  Performed at [facility], [address] [place], [place] [address]   Culture, blood (Routine X 2) w Reflex to ID Panel     Status: Abnormal   Collection Time: 11/09/23 10:25 AM   Specimen: BLOOD RIGHT HAND  Result Value Ref Range Status   Specimen Description BLOOD RIGHT HAND  Final   Special Requests   Final    BOTTLES DRAWN AEROBIC AND ANAEROBIC Blood Culture adequate volume   Culture  Setup Time   Final    GRAM POSITIVE COCCI IN CLUSTERS IN BOTH AEROBIC AND ANAEROBIC BOTTLES CRITICAL VALUE NOTED.  VALUE IS CONSISTENT WITH PREVIOUSLY REPORTED AND CALLED VALUE.    Culture (A)  Final    STAPHYLOCOCCUS AUREUS SUSCEPTIBILITIES PERFORMED ON PREVIOUS CULTURE WITHIN THE LAST 5 DAYS. Performed at [facility], [address], [place], [place] [address]    Report Status 11/11/2023 FINAL  Final  Culture, blood (Routine X 2) w Reflex to ID Panel     Status: Abnormal (Preliminary result)   Collection Time: 11/09/23 10:38 AM   Specimen: BLOOD RIGHT ARM  Result Value Ref Range Status   Specimen Description BLOOD RIGHT ARM  Final   Special Requests   Final    BOTTLES DRAWN AEROBIC AND ANAEROBIC Blood Culture adequate volume   Culture  Setup Time   Final    GRAM POSITIVE COCCI IN CLUSTERS IN BOTH AEROBIC AND ANAEROBIC BOTTLES CRITICAL RESULT CALLED TO, READ BACK BY AND VERIFIED WITH: PHARMD J [facility] 11/10/2023 @ 0159 BY AB    Culture (A)  Final    METHICILLIN RESISTANT STAPHYLOCOCCUS AUREUS Sent to [facility] for further susceptibility testing. Performed at [facility], [address], [place], [place] [address]    Report Status PENDING  Incomplete   Organism ID, Bacteria METHICILLIN RESISTANT STAPHYLOCOCCUS AUREUS  Final      Susceptibility   Methicillin resistant staphylococcus aureus - MIC*    CIPROFLOXACIN >=8 RESISTANT Resistant     ERYTHROMYCIN >=8 RESISTANT Resistant     GENTAMICIN <=0.5  SENSITIVE Sensitive     OXACILLIN >=4 RESISTANT Resistant     TETRACYCLINE <=1 SENSITIVE Sensitive     VANCOMYCIN  1 SENSITIVE Sensitive     TRIMETH/SULFA <=10 SENSITIVE Sensitive     CLINDAMYCIN <=0.25 SENSITIVE Sensitive     RIFAMPIN <=0.5 SENSITIVE Sensitive     Inducible Clindamycin NEGATIVE Sensitive     LINEZOLID 2 SENSITIVE Sensitive     * METHICILLIN RESISTANT STAPHYLOCOCCUS AUREUS  Blood Culture ID Panel (Reflexed)     Status: Abnormal   Collection Time: 11/09/23 10:38 AM  Result Value Ref Range Status   Enterococcus faecalis NOT DETECTED NOT DETECTED Final   Enterococcus Faecium NOT DETECTED NOT DETECTED Final   Listeria monocytogenes NOT DETECTED NOT DETECTED Final   Staphylococcus species DETECTED (A) NOT DETECTED Final    Comment: CRITICAL RESULT CALLED TO, READ BACK BY AND VERIFIED WITH: PHARMD J [facility] 11/10/2023 @ 0159 BY AB    Staphylococcus aureus (BCID) DETECTED (A) NOT DETECTED Final    Comment: Methicillin (oxacillin)-resistant Staphylococcus aureus (MRSA). MRSA is predictably resistant to beta-lactam antibiotics (except ceftaroline). Preferred therapy is vancomycin  unless clinically contraindicated. Patient requires contact precautions if  hospitalized. CRITICAL RESULT CALLED TO, READ BACK BY AND VERIFIED WITH: PHARMD J [facility] 11/10/2023 @ 0159 BY AB    Staphylococcus epidermidis NOT DETECTED NOT DETECTED Final   Staphylococcus lugdunensis NOT DETECTED NOT DETECTED Final   Streptococcus species NOT DETECTED NOT DETECTED Final   Streptococcus agalactiae NOT DETECTED NOT DETECTED Final   Streptococcus pneumoniae NOT DETECTED NOT DETECTED Final   Streptococcus pyogenes NOT DETECTED NOT DETECTED Final   A.calcoaceticus-baumannii NOT DETECTED NOT DETECTED Final   Bacteroides fragilis NOT DETECTED NOT DETECTED Final   Enterobacterales NOT DETECTED NOT DETECTED Final   Enterobacter cloacae complex NOT DETECTED NOT DETECTED Final   Escherichia coli NOT DETECTED NOT  DETECTED Final   Klebsiella aerogenes NOT DETECTED NOT DETECTED Final   Klebsiella oxytoca NOT DETECTED NOT DETECTED Final   Klebsiella pneumoniae NOT DETECTED NOT DETECTED Final   Proteus species NOT DETECTED NOT DETECTED Final   Salmonella species NOT DETECTED NOT DETECTED Final   Serratia marcescens NOT DETECTED NOT DETECTED Final   Haemophilus influenzae NOT DETECTED NOT DETECTED Final   Neisseria meningitidis  NOT DETECTED NOT DETECTED Final   Pseudomonas aeruginosa NOT DETECTED NOT DETECTED Final   Stenotrophomonas maltophilia NOT DETECTED NOT DETECTED Final   Candida albicans NOT DETECTED NOT DETECTED Final   Candida auris NOT DETECTED NOT DETECTED Final   Candida glabrata NOT DETECTED NOT DETECTED Final   Candida krusei NOT DETECTED NOT DETECTED Final   Candida parapsilosis NOT DETECTED NOT DETECTED Final   Candida tropicalis NOT DETECTED NOT DETECTED Final   Cryptococcus neoformans/gattii NOT DETECTED NOT DETECTED Final   Meth resistant mecA/C and MREJ DETECTED (A) NOT DETECTED Final    Comment: CRITICAL RESULT CALLED TO, READ BACK BY AND VERIFIED WITH: PHARMD J [facility] 11/10/2023 @ 0159 BY AB Performed at [facility], [address], [place], [place] [address]   Culture, blood (Routine X 2) w Reflex to ID Panel     Status: None (Preliminary result)   Collection Time: 11/11/23 12:33 PM   Specimen: BLOOD RIGHT ARM  Result Value Ref Range Status   Specimen Description BLOOD RIGHT ARM  Final   Special Requests   Final    BOTTLES DRAWN AEROBIC AND ANAEROBIC Blood Culture adequate volume   Culture   Final    NO GROWTH 2 DAYS Performed at [facility], [address], [place], [place] [address]    Report Status PENDING  Incomplete  Culture, blood (Routine X 2) w Reflex to ID Panel     Status: None (Preliminary result)   Collection Time: 11/11/23 12:33 PM   Specimen: BLOOD RIGHT HAND  Result Value Ref Range Status   Specimen Description BLOOD RIGHT HAND  Final    Special Requests   Final    BOTTLES DRAWN AEROBIC AND ANAEROBIC Blood Culture adequate volume   Culture   Final    NO GROWTH 2 DAYS Performed at [facility], [address], [place], [place] [address]    Report Status PENDING  Incomplete     Radiology Studies: ECHO TEE Result Date: 11/12/2023    TRANSESOPHOGEAL ECHO REPORT   Patient Name:   Maria Arroyo Date of Exam: 11/12/2023 Medical Rec #:  [id number]                  Height:       59.0 in Accession #:    [id number]                 Weight:       144.6 lb Date of Birth:  07/06/1950                  BSA:          1.607 m Patient Age:    73 years                   BP:           157/77 mmHg Patient Gender: F                          HR:           75 bpm. Exam Location:  Inpatient Procedure: Transesophageal Echo, Cardiac Doppler, Color Doppler and 3D Echo            (Both Spectral and Color Flow Doppler were utilized during            procedure). Indications:     endocarditis  History:  Patient has prior history of Echocardiogram examinations, most                  recent 11/04/2023. CAD, COPD and chronic kidney disease,                  Signs/Symptoms:Shortness of Breath; Risk Factors:Hypertension                  and Dyslipidemia.  Sonographer:     Tinnie Barefoot RDCS Referring Phys:  [id number] WADDELL A PARCELLS Diagnosing Phys: Lonni Nanas MD PROCEDURE: After discussion of the risks and benefits of a TEE, an informed consent was obtained from the patient. The transesophogeal probe was passed without difficulty through the esophogus of the patient. Imaged were obtained with the patient in a left lateral decubitus position. Sedation performed by different physician. The patient was monitored while under deep sedation. Anesthestetic sedation was provided intravenously by Anesthesiology: 200mg  of Propofol , 60mg  of Lidocaine . The patient developed no complications during the procedure.  IMPRESSIONS  1. Left ventricular  ejection fraction, by estimation, is 30 to 35%. The left ventricle has moderately decreased function.  2. Right ventricular systolic function is moderately reduced. The right ventricular size is mildly enlarged.  3. Left atrial size was moderately dilated. No left atrial/left atrial appendage thrombus was detected.  4. Right atrial size was moderately dilated.  5. The mitral valve is degenerative. Moderate mitral valve regurgitation.  6. Tricuspid valve regurgitation is moderate.  7. The aortic valve is tricuspid. Aortic valve regurgitation is trivial. Aortic valve sclerosis/calcification is present, without any evidence of aortic stenosis.  8. Pulmonic valve regurgitation is moderate.  9. 3D performed of the mitral valve and 3D performed of the aortic valve and demonstrates no vegetation. 10. Evidence of atrial level shunting detected by color flow Doppler. Agitated saline contrast bubble study was positive with shunting observed within 3-6 cardiac cycles suggestive of interatrial shunt. Consistent with large PFO Conclusion(s)/Recommendation(s): No evidence of vegetation/infective endocarditis on this transesophageael echocardiogram. FINDINGS  Left Ventricle: Left ventricular ejection fraction, by estimation, is 30 to 35%. The left ventricle has moderately decreased function. The left ventricular internal cavity size was normal in size. Right Ventricle: The right ventricular size is mildly enlarged. No increase in right ventricular wall thickness. Right ventricular systolic function is moderately reduced. Left Atrium: Left atrial size was moderately dilated. No left atrial/left atrial appendage thrombus was detected. Right Atrium: Right atrial size was moderately dilated. Pericardium: There is no evidence of pericardial effusion. Mitral Valve: The mitral valve is degenerative in appearance. Moderate mitral valve regurgitation. Tricuspid Valve: The tricuspid valve is normal in structure. Tricuspid valve  regurgitation is moderate. Aortic Valve: The aortic valve is tricuspid. Aortic valve regurgitation is trivial. Aortic valve sclerosis/calcification is present, without any evidence of aortic stenosis. Pulmonic Valve: The pulmonic valve was grossly normal. Pulmonic valve regurgitation is moderate. Aorta: The aortic root is normal in size and structure. IAS/Shunts: Evidence of atrial level shunting detected by color flow Doppler. Agitated saline contrast was given intravenously to evaluate for intracardiac shunting. Agitated saline contrast bubble study was positive with shunting observed within 3-6 cardiac cycles suggestive of interatrial shunt. Additional Comments: 3D was performed not requiring image post processing on an independent workstation and was abnormal. Lonni Nanas MD Electronically signed by Lonni Nanas MD Signature Date/Time: 11/12/2023/2:03:08 PM    Final    EP STUDY Result Date: 11/12/2023 See surgical note for result.    Scheduled Meds:  amiodarone   200 mg Oral Daily   apixaban   5 mg Oral BID   busPIRone   7.5 mg Oral BID   Chlorhexidine  Gluconate Cloth  6 each Topical Daily   feeding supplement (GLUCERNA SHAKE)  237 mL Oral TID BM   ferrous sulfate   325 mg Oral Daily   hydrALAZINE   25 mg Oral TID   insulin  aspart  0-9 Units Subcutaneous TID WC   isosorbide  mononitrate  30 mg Oral Daily   lidocaine   1 patch Transdermal Q24H   metoprolol  succinate  12.5 mg Oral Daily   nicotine   21 mg Transdermal Daily   polyethylene glycol  17 g Oral Daily   rosuvastatin   20 mg Oral Daily   sodium chloride  flush  10-40 mL Intracatheter Q12H   sodium chloride  flush  3 mL Intravenous Q12H   venlafaxine  XR  75 mg Oral Daily   Continuous Infusions:  DAPTOmycin  500 mg (11/11/23 1501)     LOS: 9 days    Time spent:    Sigurd Pac, MD [facility]   11/13/2023, 11:10 AM

## 2023-11-13 NOTE — Progress Notes (Signed)
 Cardiology Progress Note  Patient ID: Maria Arroyo MRN: [id number] DOB: [birth year] Date of Encounter: 11/13/2023 Primary Cardiologist: Peter Swaz[place], MD  Subjective   Chief Complaint: Confusion   HPI: Appears to be in atrial tachycardia versus A-fib.  Known history of A-fib.  Drowsy this morning.  She will awake but goes to sleep quickly.  ROS:  All other ROS reviewed and negative. Pertinent positives noted in the HPI.     Telemetry  Overnight telemetry shows A-fib versus A. tach, left bundle branch block greater than 180 ms, which I personally reviewed.    Physical Exam   Vitals:   11/12/23 2114 11/13/23 0043 11/13/23 0448 11/13/23 0800  BP:  125/64 124/79 115/76  Pulse:  73 70 83  Resp: 18 20 20 16   Temp:  98.7 F (37.1 C) 98.1 F (36.7 C) 98.1 F (36.7 C)  TempSrc:  Axillary Oral Oral  SpO2:  98% 98% 99%  Weight:   65.4 kg   Height:        Intake/Output Summary (Last 24 hours) at 11/13/2023 1056 Last data filed at 11/13/2023 0448 Gross per 24 hour  Intake 170 ml  Output 225 ml  Net -55 ml       11/13/2023    4:48 AM 11/12/2023    4:42 AM 11/11/2023    5:07 AM  Last 3 Weights  Weight (lbs) 144 lb 2.9 oz 144 lb 10 oz 138 lb 14.2 oz  Weight (kg) 65.4 kg 65.6 kg 63 kg    Body mass index is 29.12 kg/m.  General: Ill-appearing Head: Atraumatic, normal size  Eyes: PEERLA, EOMI  Neck: Supple, no JVD Endocrine: No thryomegaly Cardiac: Normal S1, S2; tachycardia, no murmurs Lungs: Clear to auscultation bilaterally, no wheezing, rhonchi or rales  Abd: Soft, nontender, no hepatomegaly  Ext: No edema, pulses 2+ Musculoskeletal: No deformities, BUE and BLE strength normal and equal Skin: Warm and dry, no rashes   Neuro: Drowsy, will awaken and follow commands  Cardiac Studies  TTE 11/04/2023  1. Left ventricular ejection fraction, by estimation, is 30 to 35%. The  left ventricle has moderately decreased function. Left ventricular  endocardial border not  optimally defined to evaluate regional wall motion.  The left ventricular internal cavity  size was mildly dilated. Left ventricular diastolic parameters are  consistent with Grade II diastolic dysfunction (pseudonormalization).  Elevated left atrial pressure.   2. Right ventricular systolic function is moderately reduced. The right  ventricular size is moderately enlarged.   3. Left atrial size was severely dilated.   4. Right atrial size was severely dilated.   5. The mitral valve is degenerative. Mild to moderate mitral valve  regurgitation. No evidence of mitral stenosis. Moderate mitral annular  calcification.   6. Tricuspid valve regurgitation is moderate.   7. The aortic valve is calcified. There is moderate calcification of the  aortic valve. There is moderate thickening of the aortic valve. Aortic  valve regurgitation is not visualized. Aortic valve  sclerosis/calcification is present, without any evidence  of aortic stenosis.   8. The inferior vena cava is normal in size with greater than 50%  respiratory variability, suggesting right atrial pressure of 3 mmHg.   9. Very poor acoustical windows limit ability to detect focal wall motion  abnormalities. The estimated EF may be underestimated due to poor images  even with definity  contrast.   Patient Profile  Maria Arroyo is a 73 y.o. female with CAD, hypertension, hyperlipidemia, paroxysmal atrial fibrillation,  diastolic heart failure, chronic respiratory failure, lung cancer status post lobectomy, tobacco abuse admitted on 11/04/2023 for acute hypoxic respiratory failure secondary to new onset systolic heart failure.SABRA  She was diagnosed with cardiogenic shock and sepsis.  Course complicated by MSSA bacteremia.  She has been weaned off inotropes.  Assessment & Plan   # Acute systolic heart failure, EF 30-35% # Cardiogenic shock, resolved - Appears euvolemic.  No further IV diuresis needed. - Not a candidate for  ischemia evaluation given advanced kidney disease. - The etiology of her LV dysfunction could be related to A-fib and wide left bundle branch block. - Plan is for medical management. - Add metoprolol  succinate 12.5 mg daily.  We will cautiously see if she can tolerate this. - Continue hydralazine  25 mg 3 times daily.  Stop Isordil .  Start Imdur  30 mg daily.  Not a candidate for ACE/ARB/Arni/MRA given CKD stage IV. - Overall plan is for medical management.  # Paroxysmal A-fib # A. tach? - Known history of A-fib.  On Eliquis  5 mg twice daily.  Currently amiodarone  200 mg daily.  I have added a low-dose beta-blocker to see if this can help her heart rate and rhythm.  May need to go on IV amiodarone .  Will see how she does.  Very limited options.  # AKI # CKD IV - stable  # NSTEMI # Prior PCI to RCA - demand. No ischemia eval given CKD IV - Continue statin   # MSSA Bacteremia -negative TEE  # COPD # Tobacco abuse  - per hospital team   # Encephalopathy  - 2/2 critical illness   # Goals of Care - would benefit from [facility] discussion      For questions or updates, please contact [facility] Please consult [email or web address] for contact info under       Signed, Darryle T. Barbaraann, MD, [facility]  [facility]  11/13/2023 10:56 AM

## 2023-11-13 NOTE — Progress Notes (Signed)
 This RN received pt back from [facility]  Renal with a skin tear on her Left hand  which was not present before pt left the unit. I notified the transport tech. I asked pt.  Pt said she bumped her hand on the door during transport back from [facility]  Renal. Skin tear assessed, cleansed, foam dressing applied and documentation in flowsheet.

## 2023-11-13 NOTE — Plan of Care (Signed)

## 2023-11-13 NOTE — Progress Notes (Signed)
 CCMD notified that patients heart rhythm change. EKG obtained and in epic. On call provider notified. VS obtained and in chart and stable. No new orders received. States to notify if HR sustains above 115.

## 2023-11-14 ENCOUNTER — Encounter (HOSPITAL_COMMUNITY): Payer: Self-pay | Admitting: Cardiology

## 2023-11-14 ENCOUNTER — Inpatient Hospital Stay (HOSPITAL_COMMUNITY)

## 2023-11-14 DIAGNOSIS — Z515 Encounter for palliative care: Secondary | ICD-10-CM

## 2023-11-14 DIAGNOSIS — Z7189 Other specified counseling: Secondary | ICD-10-CM

## 2023-11-14 DIAGNOSIS — I5021 Acute systolic (congestive) heart failure: Secondary | ICD-10-CM | POA: Diagnosis not present

## 2023-11-14 LAB — BLOOD GAS, ARTERIAL
Acid-base deficit: 2.9 mmol/L — ABNORMAL HIGH (ref 0.0–2.0)
Bicarbonate: 27.2 mmol/L (ref 20.0–28.0)
O2 Saturation: 94.9 %
Patient temperature: 37
pCO2 arterial: 73 mmHg (ref 32–48)
pH, Arterial: 7.18 — CL (ref 7.35–7.45)
pO2, Arterial: 73 mmHg — ABNORMAL LOW (ref 83–108)

## 2023-11-14 LAB — BASIC METABOLIC PANEL WITH GFR
Anion gap: 17 — ABNORMAL HIGH (ref 5–15)
BUN: 115 mg/dL — ABNORMAL HIGH (ref 8–23)
CO2: 28 mmol/L (ref 22–32)
Calcium: 8.2 mg/dL — ABNORMAL LOW (ref 8.9–10.3)
Chloride: 88 mmol/L — ABNORMAL LOW (ref 98–111)
Creatinine, Ser: 3.39 mg/dL — ABNORMAL HIGH (ref 0.44–1.00)
GFR, Estimated: 14 mL/min — ABNORMAL LOW (ref >60)
Glucose, Bld: 144 mg/dL — ABNORMAL HIGH (ref 70–99)
Potassium: 5.1 mmol/L (ref 3.5–5.1)
Sodium: 133 mmol/L — ABNORMAL LOW (ref 135–145)

## 2023-11-14 LAB — GLUCOSE, CAPILLARY: Glucose-Capillary: 124 mg/dL — ABNORMAL HIGH (ref 70–99)

## 2023-11-14 MED ORDER — HYDROMORPHONE HCL 1 MG/ML IJ SOLN: 1.0000 mg | INTRAMUSCULAR | Status: DC | PRN

## 2023-11-14 MED ORDER — HYDROMORPHONE HCL 1 MG/ML IJ SOLN
2.0000 mg | Freq: Once | INTRAMUSCULAR | Status: AC
  Administered 2023-11-14: 2 mg via INTRAVENOUS
  Filled 2023-11-14: qty 2

## 2023-11-14 MED ORDER — HYDROMORPHONE BOLUS VIA INFUSION
1.0000 mg | INTRAVENOUS | Status: DC | PRN
  Administered 2023-11-14: 1 mg via INTRAVENOUS

## 2023-11-14 MED ORDER — SODIUM CHLORIDE 0.9 % IV SOLN
2.0000 mg/h | INTRAVENOUS | Status: DC
  Administered 2023-11-14: 2 mg/h via INTRAVENOUS
  Filled 2023-11-14: qty 5

## 2023-11-14 MED ORDER — BIOTENE DRY MOUTH MT LIQD: 15.0000 mL | OROMUCOSAL | Status: DC | PRN

## 2023-11-14 MED ORDER — POLYVINYL ALCOHOL 1.4 % OP SOLN: 1.0000 [drp] | Freq: Four times a day (QID) | OPHTHALMIC | Status: DC | PRN

## 2023-11-14 MED ORDER — DIAZEPAM 5 MG/ML IJ SOLN
2.5000 mg | INTRAMUSCULAR | Status: DC
  Administered 2023-11-14: 2.5 mg via INTRAVENOUS
  Filled 2023-11-14: qty 2

## 2023-11-14 MED ORDER — HYDROMORPHONE HCL-NACL 50-0.9 MG/50ML-% IV SOLN
2.0000 mg/h | INTRAVENOUS | Status: DC
  Filled 2023-11-14: qty 50

## 2023-11-15 LAB — MINIMUM INHIBITORY CONC. (1 DRUG)

## 2023-11-15 LAB — MIC RESULT

## 2023-11-16 LAB — CULTURE, BLOOD (ROUTINE X 2)
Culture: NO GROWTH
Culture: NO GROWTH
Special Requests: ADEQUATE
Special Requests: ADEQUATE

## 2023-11-19 LAB — CULTURE, BLOOD (ROUTINE X 2): Special Requests: ADEQUATE

## 2023-11-22 ENCOUNTER — Ambulatory Visit: Admitting: Cardiology

## 2023-11-29 NOTE — Progress Notes (Signed)
 PROGRESS NOTE    Maria Arroyo  FMW:[id number] DOB: [birth year] DOA: 11/04/2023 PCP: Aminta Lamar Hamilton, MD  72/F with history of chronic back pain, paroxysmal A-fib, hypertension, chronic diastolic CHF, COPD, chronic respiratory failure on 3 L home O2, lung cancer with right lobectomy in 2020, type 2 diabetes, depression, tobacco use presented to the ED with worsening dyspnea and CHF. - Diagnosed with new onset CHF, troponin 1139, EF down to 30-35% with BiV failure, cardiology following, briefly on milrinone , subsequently discontinued 8/12 -8/11 onwards became lethargic with leukocytosis, - 11/09/11 early a.m. blood cultures growing MRSA> started vancomycin  - 8/13, ID consult Vanc changed to daptomycin , PICC line removed, persistent encephalopathy - 8/15 onwards mental status slowly improving 8/15-TEE negative for endocarditis - 8/16, worsening encephalopathy and AKI, uremia, starting IV fluids - 12/14/2023, worsening kidney function, encephalopathy, respiratory distress  Subjective: - In respiratory distress this morning, remains obtunded and confused  Assessment and Plan:  MRSA bacteremia Sepsis, not POA - Developed low-grade fever, leukocytosis and encephalopathy since admission, blood cultures growing MRSA,, appreciate ID input, vancomycin  changed to daptomycin  - PICC line removed 8/13 - TEE 8/15 negative for endocarditis, ID recommended 2-week course of daptomycin  - Repeat blood cultures negative so far - Unfortunately continues to decline clinically, with worsening renal failure, new ventricular heart failure, encephalopathy etc.> not a dialysis candidate, discussed poor prognosis with cardiology, patient's daughter and spouse at bedside, she will be transitioned to comfort care today, palliative care to see for comfort meds  Encephalopathy - Suspect primarily secondary to sepsis and MRSA bacteremia - decreased BuSpar  and Effexor  dose - Now worsening in the setting of  uremia  Acute on chronic systolic and diastolic CHF New biventricular failure -Echo this admission noted EF down to 30-35%, moderately reduced RV, severely elevated dilated BAE - Concern for ischemic cardiomyopathy, troponin peaked at 1139, prior history of LAD stenting -Was on IV milrinone  for approximately 48 hours, now off - Deferring ischemic eval at this time in the setting of AKI, encephalopathy etc., cards following - See discussion above  NSTEMI - Troponin peaked at 1139, then trending down - Conservative management at this time in the setting of sepsis, encephalopathy, AKI etc.  Acute kidney injury on CKD 4 -baseline creat 1.7-2.2 range - Worsening creatinine in the setting of sepsis, likely ATN, diuretics have been on hold - Creatinine had plateaued, now worsening again with significant uremia, renal ultrasound without hydronephrosis - Not a dialysis candidate, discussed with cards and family at bedside, plan for comfort care  Multiple rib fractures - Family reports multiple recent falls - Needs pulmonary toilet, add nebs, incentive spirometry when more awake  Paroxysmal atrial fibrillation - Known history of A-fib, continue Eliquis  and amiodarone   Hypokalemia Repleted  Transaminitis - Likely from congestion of HFrEF.  LFTs showing improvement this morning.  Will recheck in AM.   Diabetes mellitus - CBGs improving, continue Semglee    Iron  deficiency anemia - No overt bleeding.  Monitor   Paroxysmal atrial fibrillation - Now off IV amiodarone , apixaban    History of squamous cell carcinoma of the right lung - S/p right lobectomy in 2020.  COPD/chronic respiratory failure On 3 L home O2 at baseline   Tobacco abuse - Still smokes.Nicotine  patch was offered.   Obesity class I - BMI nearly 32.  Encouraged lifestyle modification and weight loss.   DVT prophylaxis: apixaban  Code Status: DNR Family Communication: Discussed with spouse and daughter at  bedside Disposition Plan: In bed hospital demise  Objective:  Vitals:   29-Nov-2023 0422 2023/11/29 0723 November 29, 2023 0735 11-29-2023 0806  BP: 120/68   106/60  Pulse:    62  Resp:  19  (!) 23  Temp: 97.8 F (36.6 C)   (!) 97.3 F (36.3 C)  TempSrc: Oral   Oral  SpO2:  100% 98% 96%  Weight: 65.4 kg     Height:        Intake/Output Summary (Last 24 hours) at 29-Nov-2023 1115 Last data filed at Nov 29, 2023 9192 Gross per 24 hour  Intake 54.4 ml  Output 100 ml  Net -45.6 ml   Filed Weights   11/12/23 0442 11/13/23 0448 11-29-2023 0422  Weight: 65.6 kg 65.4 kg 65.4 kg    Examination:  General exam: Obtunded, in respiratory distress this morning HEENT:+ JVD CVS: S1-S2, regular rhythm Lungs: Few scattered rhonchi and rails Abdomen: Soft, nontender, bowel sounds present Remedies: 1+ edema Skin: As above Psychiatry: Flat affect    Data Reviewed:   CBC: Recent Labs  Lab 11/09/23 0211 11/10/23 0442 11/11/23 0836 11/12/23 0225 11/13/23 0227  WBC 19.6* 15.9* 14.9* 15.5* 15.2*  HGB 8.9* 9.1* 8.8* 8.6* 8.0*  HCT 29.7* 29.9* 29.2* 28.4* 26.1*  MCV 85.8 85.9 85.6 83.8 83.4  PLT 276 273 283 314 320   Basic Metabolic Panel: Recent Labs  Lab 11/08/23 0556 11/09/23 0211 11/10/23 0442 11/11/23 0836 11/12/23 0225 11/13/23 0227 11/29/2023 0235  NA 135 135 134* 133* 133* 131* 133*  K 3.3* 3.1* 5.2* 4.4 4.0 4.2 5.1  CL 76* 82* 87* 85* 86* 85* 88*  CO2 44* 39* 32 32 31 30 28   GLUCOSE 157* 149* 222* 105* 140* 130* 144*  BUN 51* 50* 62* 76* 82* 99* 115*  CREATININE 2.08* 1.91* 2.21* 2.22* 2.32* 2.82* 3.39*  CALCIUM  9.0 9.0 8.9 9.0 8.9 8.6* 8.2*  MG 2.0 2.2 2.2  --   --   --   --   PHOS  --   --  4.8*  --   --   --   --    GFR: Estimated Creatinine Clearance: 12.2 mL/min (A) (by C-G formula based on SCr of 3.39 mg/dL (H)). Liver Function Tests: Recent Labs  Lab 11/08/23 0556 11/09/23 0211 11/10/23 0442  AST 37 35 161*  ALT 112* 78* 137*  ALKPHOS 124 110 145*  BILITOT 0.4 0.6  0.9  PROT 6.3* 6.0* 6.3*  ALBUMIN 2.4* 2.3* 2.2*   No results for input(s): LIPASE, AMYLASE in the last 168 hours. Recent Labs  Lab 11/08/23 1130  AMMONIA 41*   Coagulation Profile: No results for input(s): INR, PROTIME in the last 168 hours. Cardiac Enzymes: Recent Labs  Lab 11/11/23 0836  CKTOTAL 120   BNP (last 3 results) No results for input(s): PROBNP in the last 8760 hours. HbA1C: No results for input(s): HGBA1C in the last 72 hours. CBG: Recent Labs  Lab 11/13/23 0634 11/13/23 1212 11/13/23 1540 11/13/23 2201 11/29/2023 0630  GLUCAP 131* 128* 157* 133* 124*   Lipid Profile: No results for input(s): CHOL, HDL, LDLCALC, TRIG, CHOLHDL, LDLDIRECT in the last 72 hours. Thyroid Function Tests: No results for input(s): TSH, T4TOTAL, FREET4, T3FREE, THYROIDAB in the last 72 hours. Anemia Panel: No results for input(s): VITAMINB12, FOLATE, FERRITIN, TIBC, IRON , RETICCTPCT in the last 72 hours. Urine analysis:    Component Value Date/Time   COLORURINE YELLOW 11/09/2023 1155   APPEARANCEUR HAZY (A) 11/09/2023 1155   LABSPEC 1.015 11/09/2023 1155   PHURINE 8.0 11/09/2023 1155   GLUCOSEU  NEGATIVE 11/09/2023 1155   HGBUR NEGATIVE 11/09/2023 1155   BILIRUBINUR NEGATIVE 11/09/2023 1155   KETONESUR NEGATIVE 11/09/2023 1155   PROTEINUR 30 (A) 11/09/2023 1155   NITRITE POSITIVE (A) 11/09/2023 1155   LEUKOCYTESUR NEGATIVE 11/09/2023 1155   Sepsis Labs: @LABRCNTIP (procalcitonin:4,lacticidven:4)  ) Recent Results (from the past 240 hours)  MIC (1 Drug)-Blood culture; 11/09/2023; BLOOD RIGHT ARM; MRSA; Daptomycin      Status: Abnormal   Collection Time: 11/09/23  8:25 AM   Specimen: BLOOD RIGHT ARM  Result Value Ref Range Status   Min Inhibitory Conc (1 Drug) Preliminary report (A)  Final    Comment: (NOTE) Performed At: [facility] [employer] [address] [place], [place] [id number] Jennette Shorter MD Ey:[phone]     Source [phone]  Final    Comment: Performed at [facility], [address], [place], [place] [address]  MIC Result     Status: Abnormal   Collection Time: 11/09/23  8:25 AM  Result Value Ref Range Status   Result 1 (MIC) Comment (A)  Final    Comment: (NOTE) Methicillin - resistant Staphylococcus aureus Identification performed by account, not confirmed by this laboratory. DAPTOMYCIN  Performed At: [facility] [address] [place], [place] [id number] Jennette Shorter MD Ey:[phone]   Resp panel by RT-PCR (RSV, Flu A&B, Covid) Anterior Nasal Swab     Status: None   Collection Time: 11/09/23 10:19 AM   Specimen: Anterior Nasal Swab  Result Value Ref Range Status   SARS Coronavirus 2 by RT PCR NEGATIVE NEGATIVE Final   Influenza A by PCR NEGATIVE NEGATIVE Final   Influenza B by PCR NEGATIVE NEGATIVE Final    Comment: (NOTE) The Xpert Xpress SARS-CoV-2/FLU/RSV plus assay is intended as an aid in the diagnosis of influenza from Nasopharyngeal swab specimens and should not be used as a sole basis for treatment. Nasal washings and aspirates are unacceptable for Xpert Xpress SARS-CoV-2/FLU/RSV testing.  Fact Sheet for Patients: BloggerCourse.com  Fact Sheet for Healthcare Providers: SeriousBroker.it  This test is not yet approved or cleared by the [place]  FDA and has been authorized for detection and/or diagnosis of SARS-CoV-2 by FDA under an Emergency Use Authorization (EUA). This EUA will remain in effect (meaning this test can be used) for the duration of the COVID-19 declaration under Section 564(b)(1) of the Act, 21 U.S.C. section 360bbb-3(b)(1), unless the authorization is terminated or revoked.     Resp Syncytial Virus by PCR NEGATIVE NEGATIVE Final    Comment: (NOTE) Fact Sheet for Patients: BloggerCourse.com  Fact Sheet for Healthcare  Providers: SeriousBroker.it  This test is not yet approved or cleared by the [place]  FDA and has been authorized for detection and/or diagnosis of SARS-CoV-2 by FDA under an Emergency Use Authorization (EUA). This EUA will remain in effect (meaning this test can be used) for the duration of the COVID-19 declaration under Section 564(b)(1) of the Act, 21 U.S.C. section 360bbb-3(b)(1), unless the authorization is terminated or revoked.  Performed at [facility], [address], [place], [place] [address]   Culture, blood (Routine X 2) w Reflex to ID Panel     Status: Abnormal   Collection Time: 11/09/23 10:25 AM   Specimen: BLOOD RIGHT HAND  Result Value Ref Range Status   Specimen Description BLOOD RIGHT HAND  Final   Special Requests   Final    BOTTLES DRAWN AEROBIC AND ANAEROBIC Blood Culture adequate volume   Culture  Setup Time   Final  GRAM POSITIVE COCCI IN CLUSTERS IN BOTH AEROBIC AND ANAEROBIC BOTTLES CRITICAL VALUE NOTED.  VALUE IS CONSISTENT WITH PREVIOUSLY REPORTED AND CALLED VALUE.    Culture (A)  Final    STAPHYLOCOCCUS AUREUS SUSCEPTIBILITIES PERFORMED ON PREVIOUS CULTURE WITHIN THE LAST 5 DAYS. Performed at [facility], [address], [place], [place] [address]    Report Status 11/11/2023 FINAL  Final  Culture, blood (Routine X 2) w Reflex to ID Panel     Status: Abnormal (Preliminary result)   Collection Time: 11/09/23 10:38 AM   Specimen: BLOOD RIGHT ARM  Result Value Ref Range Status   Specimen Description BLOOD RIGHT ARM  Final   Special Requests   Final    BOTTLES DRAWN AEROBIC AND ANAEROBIC Blood Culture adequate volume   Culture  Setup Time   Final    GRAM POSITIVE COCCI IN CLUSTERS IN BOTH AEROBIC AND ANAEROBIC BOTTLES CRITICAL RESULT CALLED TO, READ BACK BY AND VERIFIED WITH: PHARMD J [facility] 11/10/2023 @ 0159 BY AB    Culture (A)  Final    METHICILLIN RESISTANT STAPHYLOCOCCUS AUREUS Sent to [facility]  for further susceptibility testing. Performed at [facility], [address], [place], [place] [address]    Report Status PENDING  Incomplete   Organism ID, Bacteria METHICILLIN RESISTANT STAPHYLOCOCCUS AUREUS  Final      Susceptibility   Methicillin resistant staphylococcus aureus - MIC*    CIPROFLOXACIN >=8 RESISTANT Resistant     ERYTHROMYCIN >=8 RESISTANT Resistant     GENTAMICIN <=0.5 SENSITIVE Sensitive     OXACILLIN >=4 RESISTANT Resistant     TETRACYCLINE <=1 SENSITIVE Sensitive     VANCOMYCIN  1 SENSITIVE Sensitive     TRIMETH/SULFA <=10 SENSITIVE Sensitive     CLINDAMYCIN <=0.25 SENSITIVE Sensitive     RIFAMPIN <=0.5 SENSITIVE Sensitive     Inducible Clindamycin NEGATIVE Sensitive     LINEZOLID 2 SENSITIVE Sensitive     * METHICILLIN RESISTANT STAPHYLOCOCCUS AUREUS  Blood Culture ID Panel (Reflexed)     Status: Abnormal   Collection Time: 11/09/23 10:38 AM  Result Value Ref Range Status   Enterococcus faecalis NOT DETECTED NOT DETECTED Final   Enterococcus Faecium NOT DETECTED NOT DETECTED Final   Listeria monocytogenes NOT DETECTED NOT DETECTED Final   Staphylococcus species DETECTED (A) NOT DETECTED Final    Comment: CRITICAL RESULT CALLED TO, READ BACK BY AND VERIFIED WITH: PHARMD J [facility] 11/10/2023 @ 0159 BY AB    Staphylococcus aureus (BCID) DETECTED (A) NOT DETECTED Final    Comment: Methicillin (oxacillin)-resistant Staphylococcus aureus (MRSA). MRSA is predictably resistant to beta-lactam antibiotics (except ceftaroline). Preferred therapy is vancomycin  unless clinically contraindicated. Patient requires contact precautions if  hospitalized. CRITICAL RESULT CALLED TO, READ BACK BY AND VERIFIED WITH: PHARMD J [facility] 11/10/2023 @ 0159 BY AB    Staphylococcus epidermidis NOT DETECTED NOT DETECTED Final   Staphylococcus lugdunensis NOT DETECTED NOT DETECTED Final   Streptococcus species NOT DETECTED NOT DETECTED Final   Streptococcus agalactiae NOT DETECTED  NOT DETECTED Final   Streptococcus pneumoniae NOT DETECTED NOT DETECTED Final   Streptococcus pyogenes NOT DETECTED NOT DETECTED Final   A.calcoaceticus-baumannii NOT DETECTED NOT DETECTED Final   Bacteroides fragilis NOT DETECTED NOT DETECTED Final   Enterobacterales NOT DETECTED NOT DETECTED Final   Enterobacter cloacae complex NOT DETECTED NOT DETECTED Final   Escherichia coli NOT DETECTED NOT DETECTED Final   Klebsiella aerogenes NOT DETECTED NOT DETECTED Final   Klebsiella oxytoca NOT DETECTED NOT DETECTED  Final   Klebsiella pneumoniae NOT DETECTED NOT DETECTED Final   Proteus species NOT DETECTED NOT DETECTED Final   Salmonella species NOT DETECTED NOT DETECTED Final   Serratia marcescens NOT DETECTED NOT DETECTED Final   Haemophilus influenzae NOT DETECTED NOT DETECTED Final   Neisseria meningitidis NOT DETECTED NOT DETECTED Final   Pseudomonas aeruginosa NOT DETECTED NOT DETECTED Final   Stenotrophomonas maltophilia NOT DETECTED NOT DETECTED Final   Candida albicans NOT DETECTED NOT DETECTED Final   Candida auris NOT DETECTED NOT DETECTED Final   Candida glabrata NOT DETECTED NOT DETECTED Final   Candida krusei NOT DETECTED NOT DETECTED Final   Candida parapsilosis NOT DETECTED NOT DETECTED Final   Candida tropicalis NOT DETECTED NOT DETECTED Final   Cryptococcus neoformans/gattii NOT DETECTED NOT DETECTED Final   Meth resistant mecA/C and MREJ DETECTED (A) NOT DETECTED Final    Comment: CRITICAL RESULT CALLED TO, READ BACK BY AND VERIFIED WITH: PHARMD J [facility] 11/10/2023 @ 0159 BY AB Performed at [facility], [address] [place], [place] [address]   Culture, blood (Routine X 2) w Reflex to ID Panel     Status: None (Preliminary result)   Collection Time: 11/11/23 12:33 PM   Specimen: BLOOD RIGHT ARM  Result Value Ref Range Status   Specimen Description BLOOD RIGHT ARM  Final   Special Requests   Final    BOTTLES DRAWN AEROBIC AND ANAEROBIC Blood Culture  adequate volume   Culture   Final    NO GROWTH 3 DAYS Performed at [facility], [address] [place], [place] [address]    Report Status PENDING  Incomplete  Culture, blood (Routine X 2) w Reflex to ID Panel     Status: None (Preliminary result)   Collection Time: 11/11/23 12:33 PM   Specimen: BLOOD RIGHT HAND  Result Value Ref Range Status   Specimen Description BLOOD RIGHT HAND  Final   Special Requests   Final    BOTTLES DRAWN AEROBIC AND ANAEROBIC Blood Culture adequate volume   Culture   Final    NO GROWTH 3 DAYS Performed at [facility], [address], [place], [place] [address]    Report Status PENDING  Incomplete     Radiology Studies: DG Chest Port 1 View Result Date: Nov 17, 2023 EXAM: 1 VIEW XRAY OF THE CHEST 11-17-23 07:15:00 AM COMPARISON: 11/09/2023 CLINICAL HISTORY: 33497 Acute respiratory distress FINDINGS: LUNGS AND PLEURA: Mild interstitial edema. Moderate right and small left pleural effusion. No focal pulmonary opacity. HEART AND MEDIASTINUM: Stable cardiomediastinal contours. Aortic atherosclerosis. BONES AND SOFT TISSUES: No acute osseous abnormality. IMPRESSION: 1. Moderate right and small left pleural effusion, unchanged from prior exam. 2. Mild interstitial edema, unchanged from prior exam. Electronically signed by: Waddell Calk MD 11/17/2023 07:51 AM EDT RP Workstation: [id number]   US  RENAL Result Date: 11/13/2023 CLINICAL DATA:  Acute kidney injury EXAM: RENAL / URINARY TRACT ULTRASOUND COMPLETE COMPARISON:  None Available. FINDINGS: Right Kidney: Renal measurements: 10.0 x 4.1 x 5.7 cm = volume: 124 mL. Echogenic renal parenchyma. No hydronephrosis or renal mass is visualized. Left Kidney: Renal measurements: 6.4 x 4.6 x 3.4 cm = volume: 52 mL. Limited visualization due to overlying bowel gases and renal atrophy. Left renal cyst measuring 1.9 x 2.2 x 2.5 cm. Echogenic renal parenchyma. Bladder: Appears normal for degree of bladder distention.  Other: None. IMPRESSION: 1. Limited visualization particularly of the left kidney due to overlying bowel gas. 2. Atrophic left kidney.  Echogenic renal parenchyma bilaterally suggestive of chronic medical renal disease. 3. No hydronephrosis. Electronically Signed   By: Michaeline Blanch M.D.   On: 11/13/2023 14:44   ECHO TEE Result Date: 11/12/2023    TRANSESOPHOGEAL ECHO REPORT   Patient Name:   Maria Arroyo Date of Exam: 11/12/2023 Medical Rec #:  [id number]                  Height:       59.0 in Accession #:    [id number]                 Weight:       144.6 lb Date of Birth:  07/24/1950                  BSA:          1.607 m Patient Age:    73 years                   BP:           157/77 mmHg Patient Gender: F                          HR:           75 bpm. Exam Location:  Inpatient Procedure: Transesophageal Echo, Cardiac Doppler, Color Doppler and 3D Echo            (Both Spectral and Color Flow Doppler were utilized during            procedure). Indications:     endocarditis  History:         Patient has prior history of Echocardiogram examinations, most                  recent 11/04/2023. CAD, COPD and chronic kidney disease,                  Signs/Symptoms:Shortness of Breath; Risk Factors:Hypertension                  and Dyslipidemia.  Sonographer:     Tinnie Barefoot RDCS Referring Phys:  [id number] WADDELL A PARCELLS Diagnosing Phys: Lonni Nanas MD PROCEDURE: After discussion of the risks and benefits of a TEE, an informed consent was obtained from the patient. The transesophogeal probe was passed without difficulty through the esophogus of the patient. Imaged were obtained with the patient in a left lateral decubitus position. Sedation performed by different physician. The patient was monitored while under deep sedation. Anesthestetic sedation was provided intravenously by Anesthesiology: 200mg  of Propofol , 60mg  of Lidocaine . The patient developed no complications during the procedure.   IMPRESSIONS  1. Left ventricular ejection fraction, by estimation, is 30 to 35%. The left ventricle has moderately decreased function.  2. Right ventricular systolic function is moderately reduced. The right ventricular size is mildly enlarged.  3. Left atrial size was moderately dilated. No left atrial/left atrial appendage thrombus was detected.  4. Right atrial size was moderately dilated.  5. The mitral valve is degenerative. Moderate mitral valve regurgitation.  6. Tricuspid valve regurgitation is moderate.  7. The aortic valve is tricuspid. Aortic valve regurgitation is trivial. Aortic valve sclerosis/calcification is present, without any evidence of aortic stenosis.  8. Pulmonic valve regurgitation is moderate.  9. 3D performed of the mitral valve and 3D performed of the aortic valve and demonstrates no vegetation. 10. Evidence of atrial level shunting detected by  color flow Doppler. Agitated saline contrast bubble study was positive with shunting observed within 3-6 cardiac cycles suggestive of interatrial shunt. Consistent with large PFO Conclusion(s)/Recommendation(s): No evidence of vegetation/infective endocarditis on this transesophageael echocardiogram. FINDINGS  Left Ventricle: Left ventricular ejection fraction, by estimation, is 30 to 35%. The left ventricle has moderately decreased function. The left ventricular internal cavity size was normal in size. Right Ventricle: The right ventricular size is mildly enlarged. No increase in right ventricular wall thickness. Right ventricular systolic function is moderately reduced. Left Atrium: Left atrial size was moderately dilated. No left atrial/left atrial appendage thrombus was detected. Right Atrium: Right atrial size was moderately dilated. Pericardium: There is no evidence of pericardial effusion. Mitral Valve: The mitral valve is degenerative in appearance. Moderate mitral valve regurgitation. Tricuspid Valve: The tricuspid valve is normal in  structure. Tricuspid valve regurgitation is moderate. Aortic Valve: The aortic valve is tricuspid. Aortic valve regurgitation is trivial. Aortic valve sclerosis/calcification is present, without any evidence of aortic stenosis. Pulmonic Valve: The pulmonic valve was grossly normal. Pulmonic valve regurgitation is moderate. Aorta: The aortic root is normal in size and structure. IAS/Shunts: Evidence of atrial level shunting detected by color flow Doppler. Agitated saline contrast was given intravenously to evaluate for intracardiac shunting. Agitated saline contrast bubble study was positive with shunting observed within 3-6 cardiac cycles suggestive of interatrial shunt. Additional Comments: 3D was performed not requiring image post processing on an independent workstation and was abnormal. Lonni Nanas MD Electronically signed by Lonni Nanas MD Signature Date/Time: 11/12/2023/2:03:08 PM    Final    EP STUDY Result Date: 11/12/2023 See surgical note for result.    Scheduled Meds:  amiodarone   200 mg Oral Daily   Chlorhexidine  Gluconate Cloth  6 each Topical Daily   feeding supplement (GLUCERNA SHAKE)  237 mL Oral TID BM   ferrous sulfate   325 mg Oral Daily   hydrALAZINE   10 mg Oral TID   insulin  aspart  0-9 Units Subcutaneous TID WC   isosorbide  mononitrate  30 mg Oral Daily   lidocaine   1 patch Transdermal Q24H   nicotine   21 mg Transdermal Daily   polyethylene glycol  17 g Oral Daily   rosuvastatin   20 mg Oral Daily   sodium chloride  flush  10-40 mL Intracatheter Q12H   sodium chloride  flush  3 mL Intravenous Q12H   venlafaxine  XR  75 mg Oral Daily   Continuous Infusions:  DAPTOmycin  Stopped (11/13/23 1356)     LOS: 10 days    Time spent:    Sigurd Pac, MD [facility]   2023/12/09, 11:15 AM

## 2023-11-29 NOTE — Progress Notes (Signed)
 NT called RN to room. Pt abdominal breathing, wheezing. Breathing treatment administered with no change. Pt placed on NRB and called rapid response. CXR, ordered and obtained. Patient placed on Bipap.

## 2023-11-29 NOTE — Progress Notes (Addendum)
 Cardiology Progress Note  Patient ID: Maria Arroyo MRN: [id number] DOB: 09/19/1950 Date of Encounter: November 16, 2023 Primary Cardiologist: Maria [place], MD  Subjective   Chief Complaint: Altered mental status, respiratory distress   HPI: Worsening renal failure.  Chest x-ray with pulmonary edema.  Discussed goals of care with family.  They are still wanting to discuss things with the hospital team.  I am a bit concerned about her renal function and ability to remove fluid safely.  ROS:  All other ROS reviewed and negative. Pertinent positives noted in the HPI.     Telemetry  Overnight telemetry shows sinus rhythm 60s, which I personally reviewed.    Physical Exam   Vitals:   11/16/2023 0422 11-16-23 0723 11-16-2023 0735 11/16/23 0806  BP: 120/68   106/60  Pulse:    62  Resp:  19  (!) 23  Temp: 97.8 F (36.6 C)   (!) 97.3 F (36.3 C)  TempSrc: Oral   Oral  SpO2:  100% 98% 96%  Weight: 65.4 kg     Height:        Intake/Output Summary (Last 24 hours) at 11-16-2023 1010 Last data filed at 11/16/2023 9192 Gross per 24 hour  Intake 54.4 ml  Output 100 ml  Net -45.6 ml       2023/11/16    4:22 AM 11/13/2023    4:48 AM 11/12/2023    4:42 AM  Last 3 Weights  Weight (lbs) 144 lb 2.9 oz 144 lb 2.9 oz 144 lb 10 oz  Weight (kg) 65.4 kg 65.4 kg 65.6 kg    Body mass index is 29.12 kg/m.  General: Ill-appearing, tachypnea noted Head: Atraumatic, normal size  Eyes: PEERLA, EOMI  Neck: Supple, JVD 12-15 cmH2O Endocrine: No thryomegaly Cardiac: Normal S1, S2; RRR; no murmurs, rubs, or gallops Lungs: Diminished breath sounds bilaterally Abd: Soft, nontender, no hepatomegaly  Ext: No edema, pulses 2+ Musculoskeletal: No deformities Skin: Warm and dry, no rashes   Neuro: Ill-appearing, not following commands  Cardiac Studies  TTE 11/04/2023  1. Left ventricular ejection fraction, by estimation, is 30 to 35%. The  left ventricle has moderately decreased function. Left  ventricular  endocardial border not optimally defined to evaluate regional wall motion.  The left ventricular internal cavity  size was mildly dilated. Left ventricular diastolic parameters are  consistent with Grade II diastolic dysfunction (pseudonormalization).  Elevated left atrial pressure.   2. Right ventricular systolic function is moderately reduced. The right  ventricular size is moderately enlarged.   3. Left atrial size was severely dilated.   4. Right atrial size was severely dilated.   5. The mitral valve is degenerative. Mild to moderate mitral valve  regurgitation. No evidence of mitral stenosis. Moderate mitral annular  calcification.   6. Tricuspid valve regurgitation is moderate.   7. The aortic valve is calcified. There is moderate calcification of the  aortic valve. There is moderate thickening of the aortic valve. Aortic  valve regurgitation is not visualized. Aortic valve  sclerosis/calcification is present, without any evidence  of aortic stenosis.   8. The inferior vena cava is normal in size with greater than 50%  respiratory variability, suggesting right atrial pressure of 3 mmHg.   9. Very poor acoustical windows limit ability to detect focal wall motion  abnormalities. The estimated EF may be underestimated due to poor images  even with definity  contrast.   Patient Profile  Maria Arroyo is a 73 y.o. female with CAD, CKD stage  IV, hypertension, hyperlipidemia, paroxysmal A-fib, diastolic heart failure, chronic respiratory failure, lung cancer status post lobectomy, tobacco abuse admitted on 11/04/2023 for acute hypoxic respiratory failure secondary to new onset systolic heart failure.  Course complicated by cardiogenic shock and sepsis.  MSSA bacteremia.  She has been weaned off inotropes but has developed worsening respiratory distress and kidney failure.  Assessment & Plan   # Acute systolic heart failure, EF 30-35% # Cardiogenic shock - Chest  x-ray with worsening effusions.  Renal failure is worsening as well.  This is in the setting of heart failure sepsis and severe lung disease.  Family appears to be moving towards comfort measures.  I do agree with this. - Would recommend to hold CHF medications at this time.  Blood pressure is marginal.  It seems we are moving to comfort care measures.  # Acute hypoxic respiratory failure # Pulmonary edema # AKI # Uremia # CKD stage IV - Overall condition continues to decline.  Hospital team has discussed with family and likely moving towards hospice care.  Only other option would be CRRT which would require admission to the ICU and likely intubation.  I suspect with her heart function she would require pressors to tolerate CRRT.  It appears this is not in line with goals of care.  # Paroxysmal A-fib - Okay to continue amiodarone  for comfort. - Can stop Eliquis   # CAD - Prior history of PCI.  Troponins are elevated but this is related to demand in setting of heart failure.  Not ACS.  Okay to discontinue statin if moving to comfort measures.  # MSSA bacteremia - TEE negative.  Per hospital medicine.  # Encephalopathy/altered mental status - Secondary to current critical illness.  # Goals of care -Worsening respiratory failure and renal failure.  Also likely worsening heart failure.  Her critically ill with bacteremia and multiorgan system failure.  It appears we are moving towards comfort measures.  Cardiology to sign off at this time.  It appears the patient is moving towards comfort measures.  All of cardiac medications can be stopped for comfort purposes.     For questions or updates, please contact [facility] Please consult [email or web address] for contact info under      Signed, Darryle T. Barbaraann, MD, [facility]   [facility]  2023/11/24 10:10 AM

## 2023-11-29 NOTE — Plan of Care (Signed)

## 2023-11-29 NOTE — Discharge Summary (Signed)
 Death Summary  Media Pizzini FMW:[id number] DOB: [birth year] DOA: 11/12/23  PCP: Aminta Lamar Hamilton, MD  Admit date: 11/12/23 Date of Death: November 22, 2023  Final Diagnoses:  Acute kidney injury Sepsis MRSA bacteremia NSTEMI Acute systolic CHF Toxic encephalopathy   Acute on chronic diastolic heart failure (HCC)   COPD (chronic obstructive pulmonary disease) (HCC)   Coronary artery disease   Acute kidney injury superimposed on chronic kidney disease (HCC)   Elevated troponin   Atrial fibrillation (HCC)   Long QT interval   Essential hypertension   Controlled type 2 diabetes mellitus without complication, without long-term current use of insulin  (HCC)   History of lung cancer   Normocytic anemia   Anxiety and depression   Cigarette smoker   Acute hypoxic respiratory failure (HCC)   NSTEMI (non-ST elevated myocardial infarction) (HCC)   MRSA bacteremia    History of present illness:  72/F with history of chronic back pain, paroxysmal A-fib, hypertension, chronic diastolic CHF, COPD, chronic respiratory failure on 3 L home O2, lung cancer with right lobectomy in 2020, type 2 diabetes, depression, tobacco use presented to the ED with worsening dyspnea and CHF.   Hospital Course:  72/F with history of chronic back pain, paroxysmal A-fib, hypertension, chronic diastolic CHF, COPD, chronic respiratory failure on 3 L home O2, lung cancer with right lobectomy in 2020, type 2 diabetes, depression, tobacco use presented to the ED with worsening dyspnea and CHF. - Diagnosed with new onset CHF, troponin 1139, EF down to 30-35% with BiV failure, cardiology following, briefly on milrinone , subsequently discontinued 8/12 -8/11 onwards became lethargic with leukocytosis, - 11/09/11 early a.m. blood cultures growing MRSA> started vancomycin  - 8/13, ID consult Vanc changed to daptomycin , PICC line removed, persistent encephalopathy - 8/15 onwards mental status slowly improving 8/15-TEE  negative for endocarditis - 8/16, worsening encephalopathy and AKI, uremia, starting IV fluids - 11/22/2023, worsening kidney function, encephalopathy, respiratory distress    MRSA bacteremia Sepsis, not POA - Developed low-grade fever, leukocytosis and encephalopathy since admission, blood cultures growing MRSA,, appreciate ID input, vancomycin  changed to daptomycin  - PICC line removed 8/13 - TEE 8/15 negative for endocarditis, ID recommended 2-week course of daptomycin  - Unfortunately continues to decline clinically, with worsening renal failure, new ventricular heart failure, encephalopathy etc.> not a dialysis candidate, discussed poor prognosis with cardiology, patient's daughter and spouse at bedside, she was transitioned to comfort care today, palliative care following, expired 11/22/2023   Encephalopathy - Suspect primarily secondary to sepsis and MRSA bacteremia - decreased BuSpar  and Effexor  dose - Now worsening in the setting of uremia   Acute on chronic systolic and diastolic CHF New biventricular failure -Echo this admission noted EF down to 30-35%, moderately reduced RV, severely elevated dilated BAE - Concern for ischemic cardiomyopathy, troponin peaked at 1139, prior history of LAD stenting -Was on IV milrinone  for approximately 48 hours, now off - Deferring ischemic eval at this time in the setting of AKI, encephalopathy etc., cards following - See discussion above   NSTEMI - Troponin peaked at 1139, then trending down - Conservative management at this time in the setting of sepsis, encephalopathy, AKI etc.   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   COPD/chronic respiratory failure On 3 L home O2 at baseline   Tobacco abuse    Obesity class I   Signed:  Sigurd Pac  [facility] 11/15/2023, 2:29 PM

## 2023-11-29 NOTE — Consult Note (Signed)
 Palliative Medicine Inpatient Consult Note  Consulting Provider: Fairy Frames, MD   Reason for consult:   Palliative Care Consult Services Palliative Medicine Consult  Reason for Consult? for comfort care, symptom management   12-Dec-2023  HPI:  Per intake H&P -->   Maria Arroyo is a 73 y.o. female with medical history significant of hypertension, hyperlipidemia, paroxysmal atrial fibrillation, chronic diastolic congestive heart failure, chronic respiratory failure on 3 L of nasal cannula oxygen, COPD,  squamous cell carcinoma of the lung s/p right lobectomy in 2020, diabetes mellitus type 2, depression, and tobacco abuse. Admitted ten days ago in the setting of difficulty breathing from heart failure. Had been septic and declining quickly. The PMT has been asked to further support goals of care conversations.    Clinical Assessment/Goals of Care:  *Please note that this is a verbal dictation therefore any spelling or grammatical errors are due to the Dragon Medical One system interpretation.  I have reviewed medical records including EPIC notes, labs and imaging, received report from bedside RN, assessed the patient who is lying in bed with labored breathing on bipap.    I met with Maria Arroyo and daughter, Maria Arroyo to further discuss diagnosis prognosis, GOC, EOL wishes, disposition and options.   I introduced Palliative Medicine as specialized medical care for people living with serious illness. It focuses on providing relief from the symptoms and stress of a serious illness. The goal is to improve quality of life for both the patient and the family.  Medical History Review and Understanding:  A review of Maria Arroyo's past medical history significant for paroxysmal atrial fibrillation, heart failure (diastolic), COPD requiring 3 L of nasal cannula at home, squamous cell carcinoma of the lung with lobectomy in 2020, type 2 diabetes mellitus, depression, tobacco  abuse, and hypertension was completed.  Social History:  Maria Arroyo is from [place] [facility] [place] .  She is married.  They share 2 children.  She is retired.  She is woman of strong Christian values practicing within the [employer] denomination.  Advance Directives:  A detailed discussion was had today regarding advanced directives.  Maria Arroyo, Maria Arroyo, Arroyo.  Code Status:  Concepts specific to code status, artifical feeding and hydration, continued IV antibiotics and rehospitalization was had.  The difference between a aggressive medical intervention path  and a palliative comfort care path for this patient at this time was had.   Maria Arroyo is an established DO NOT RESUSCITATE DO NOT INTUBATE CODE STATUS with an emphasis on comfort focused care.  Discussion:  We reviewed Maria Arroyo's complex 10-day hospitalization and her declining mental state, respiratory state, renal function, and cardiovascular condition.  We reviewed that at this time it appears that Maria Arroyo has entered the phases of actively dying.  We discussed options moving forward and both her spouse and daughter shared that they do not want her to suffer nor do they want to meet wait for additional family members to visit.    We talked about transition to comfort measures in house and what that would entail inclusive of medications to control pain, dyspnea, agitation, nausea, itching, and hiccups.  We discussed stopping all uneccessary measures such as cardiac monitoring, blood draws, needle sticks, and frequent vital signs.   We reviewed in the setting of Maria Arroyo's profound dyspnea starting a Dilaudid  drip as we slowly wean her off BiPAP support.  We also discussed adding benzodiazepines to help with the anxious feelings associated with shortness of breath.  I was  honest and sharing patient once off of these measures likely only as hours to live.  Patient's family very emotional by this reality though  understanding of her decompensation and the severity of her clinical condition.  Utilized reflective listening throughout our time together.   Discussed the importance of continued conversation with family and their  medical providers regarding overall plan of care and treatment options, ensuring decisions are within the context of the patients values and GOCs.  Decision Maker: Maria Arroyo,Maria Arroyo (Spouse): [phone] ([place])   SUMMARY OF RECOMMENDATIONS   DNAR/DNI  Comfort care measures in place  Initiate low-dose Dilaudid  drip with boluses  Initiate diazepam  2.5 mg every 4 hours around-the-clock  Additional comfort medications per [facility]  Have requested chaplain come by for prayer  Anticipate in-hospital death  Ongoing palliative care support  Code Status/Advance Care Planning: DNAR/DNI  Palliative Prophylaxis:  Aspiration, Bowel Regimen, Delirium Protocol, Frequent Pain Assessment, Oral Care, Palliative Wound Care, and Turn Reposition  Additional Recommendations (Limitations, Scope, Preferences): Comfort care  Psycho-social/Spiritual:  Desire for further Chaplaincy support: Yes chaplain support is desired Additional Recommendations: Education on end-of-life and symptom management   Prognosis: Limited to hours  Discharge Planning: Discharge will be [facility]  Vitals:   2023-12-01 0806 Dec 01, 2023 1227  BP: 106/60   Pulse: 62 62  Resp: (!) 23 (!) 26  Temp: (!) 97.3 F (36.3 C)   SpO2: 96% 95%    Intake/Output Summary (Last 24 hours) at 2023-12-01 1239 Last data filed at December 01, 2023 9192 Gross per 24 hour  Intake 54.4 ml  Output 100 ml  Net -45.6 ml   Last Weight  Most recent update: 2023-12-01  4:23 AM    Weight  65.4 kg (144 lb 2.9 oz)            Gen: Elderly Caucasian female chronically ill in appearance HEENT: Dry mucous membranes CV: Regular rate and irregular rhythm PULM: On BiPAP device, labored ABD: soft/nontender EXT: No edema Neuro:  Somnolent  PPS: 10%   This conversation/these recommendations were discussed with patient primary care team, Dr. Fairy ______________________________________________________ Maria Arroyo [facility] Palliative Medicine Team Team Cell Phone: [phone] Please utilize secure chat with additional questions, if there is no response within 30 minutes please call the above phone number  Palliative Medicine Team providers are available by phone from 7am to 7pm daily and can be reached through the team cell phone. Should this patient require assistance outside of these hours, please call the patient's attending physician.  Total Time: 44 Billing based on MDM: High Problems Addressed: One acute or chronic illness or injury that poses a threat to life or bodily function amount and/or Complexity of Data: Category 1:Review of prior external note(s) from each unique source, Review of the result(s) of each unique test, and Assessment requiring an independent historian(s), Category 2:Independent interpretation of a test performed by another physician/other qualified health care professional (not separately reported), and Category 3:Discussion of management or test interpretation with external physician/other qualified health care professional/appropriate source (not separately reported) Risks: Parenteral controlled substances and Decision not to resuscitate or to de-escalate care because of poor prognosis

## 2023-11-29 NOTE — Progress Notes (Signed)
   11-28-2023 1402  Spiritual Encounters  Type of Visit Initial  Care provided to: Pt and family  Referral source Clinical staff  Reason for visit End-of-life  OnCall Visit Yes  Spiritual Framework  Presenting Themes Coping tools;Values and beliefs;Significant life change;Impactful experiences and emotions  Community/Connection Significant other  Patient Stress Factors Health changes;Major life changes  Family Stress Factors Health changes;Major life changes  Interventions  Spiritual Care Interventions Made Prayer;Compassionate presence;Established relationship of care and support   Chaplain was paged by the medical team to visit Pt at end of life. Upon arrival chaplain found pt at bedside with her spouse present and providing care. Spouse shared that they had been married 55 years and express how difficult this time was for him, acknowledging the helplessness he felt in this situation.  Spouse requested prayer for both his wife and himself. Chaplain offered words of comfort and prayed with and for the Pt, her spouse, and their family. Chaplain services remain available for ongoing emotional and spiritual support.

## 2023-11-29 NOTE — Significant Event (Signed)
 Rapid Response Event Note   Reason for Call :  Respiratory distress. Per RN, pt's breathing has been unlabored all night on 3L [place]. Pt became labored this AM.  Bedside RN gave breathing tx and placed pt on NRB PTA RRT.  Initial Focused Assessment:  Pt lying in bed with eyes opened. Her breathing is tachypneic and laboredl. Lungs coarse t/o. ABD soft/NT. Skin cool/clammy.  HR-61, BP-107/85, RR-46, SpO2-95% on NRB(warm pack necessary to pt's ear in order to pick up reading).   Interventions:  NRB>Bipap Albuterol  IVF off PCXR ABG Plan of Care:  Pt breathing better on bipap. SpO2-100% on .60 bipap, RR down 36. Await PCXR/ABG results and any additional MD orders. Please call RRT if further assistance needed.   Event Summary:   MD Notified: Dr. Fairy notified by bedside RN Call [phone] Arrival [phone] End [id number]  Tish Graeme Piety, RN

## 2023-11-29 NOTE — TOC Initial Note (Signed)
 Transition of Care [facility]) - Initial/Assessment Note    Patient Details  Name: Maria Arroyo MRN: [id number] Date of Birth: [birth year]  Transition [facility] CM/SW Contact:    Olam FORBES Ally, LCSW Phone Number: 2023-12-10, 1:46 PM  Clinical Narrative:                 CSW attempted to call patient's daughter Olam to receive bed choice however was unable to reach her.  TOC team will continue to assist with discharge planning needs.   Expected Discharge Plan: Skilled Nursing Facility Barriers to Discharge: Continued Medical Work up, SNF Pending bed offer, Insurance Authorization   Patient Goals and CMS Choice Patient states their goals for this hospitalization and ongoing recovery are:: wants to return home CMS Medicare.gov Compare Post Acute Care list provided to:: Patient Choice offered to / list presented to : Patient      Expected Discharge Plan and Services   Discharge Planning Services: CM Consult Post Acute Care Choice: Home Health Living arrangements for the past 2 months: Single Family Home                           HH Arranged: RN, PT [facility] Agency: [employer] [facility] Date [facility] Agency Contacted: 11/05/23 Time HH Agency Contacted: [phone] Representative spoke with at [facility] Agency: Channing Ee  Prior Living Arrangements/Services Living arrangements for the past 2 months: Single Family Home Lives with:: Spouse Patient language and need for interpreter reviewed:: Yes Do you feel safe going back to the place where you live?: Yes      Need for Family Participation in Patient Care: No (Comment) Care giver support system in place?: Yes (comment) Current home services: DME (cane, rolling walker, bedside commode and oxygen via [employer]) Criminal Activity/Legal Involvement Pertinent to Current Situation/Hospitalization: No - Comment as needed  Activities of Daily Living   ADL Screening (condition at time of admission) Independently performs ADLs?: Yes  (appropriate for developmental age) Is the patient deaf or have difficulty hearing?: No Does the patient have difficulty seeing, even when wearing glasses/contacts?: No Does the patient have difficulty concentrating, remembering, or making decisions?: No  Permission Sought/Granted Permission sought to share information with : Case Manager, Family Supports, PCP Permission granted to share information with : Yes, Verbal Permission Granted  Share Information with NAME: Grayce Riding  Permission granted to share info w AGENCY: [facility], DME, PCP  Permission granted to share info w Relationship: niece  Permission granted to share info w Contact Information: [phone]  Emotional Assessment Appearance:: Appears stated age Attitude/Demeanor/Rapport: Engaged Affect (typically observed): Accepting Orientation: : Oriented to Self, Oriented to Place, Oriented to  Time, Oriented to Situation   Psych Involvement: No (comment)  Admission diagnosis:  Acute on chronic diastolic heart failure (HCC) [I50.33] Acute on chronic congestive heart failure, unspecified heart failure type [facility]) [I50.9] Patient Active Problem List   Diagnosis Date Noted   MRSA bacteremia 11/12/2023   Acute hypoxic respiratory failure (HCC) 11/05/2023   Acute HFrEF (heart failure with reduced ejection fraction) (HCC) 11/05/2023   NSTEMI (non-ST elevated myocardial infarction) (HCC) 11/05/2023   Acute on chronic diastolic heart failure (HCC) 11/04/2023   Elevated troponin 08/11/2023   Long QT interval 08/11/2023   Normocytic anemia 08/11/2023   History of lung cancer 08/11/2023   Metabolic alkalosis 07/04/2023   Tobacco use disorder 07/03/2023   Hereditary and idiopathic peripheral neuropathy 07/03/2023   Palliative care encounter 07/03/2023  Pressure injury of skin 07/03/2023   Malignant neoplasm of lung (HCC) 07/02/2023   Acute on chronic anemia 07/02/2023   CKD (chronic kidney disease) stage 4, GFR 15-29 ml/min  (HCC) 07/02/2023   Chronic hypoxic respiratory failure (HCC) 07/02/2023   Iron  deficiency anemia 07/02/2023   Anticoagulated 07/02/2023   UTI (urinary tract infection) 05/15/2023   Acute on chronic respiratory failure with hypoxia and hypercapnia (HCC) 05/14/2023   COVID-19 virus infection 05/14/2023   Controlled type 2 diabetes mellitus without complication, without long-term current use of insulin  (HCC) 05/14/2023   Acute kidney injury superimposed on chronic kidney disease (HCC) 05/14/2023   Hypokalemia 05/14/2023   Hypophosphatemia 05/14/2023   COPD (chronic obstructive pulmonary disease) (HCC) 05/13/2023   COPD with acute exacerbation (HCC) 05/12/2023   Branch retinal vein occlusion with macular edema of left eye 12/31/2021   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   Cystoid macular edema of right eye 12/10/2021   Cystoid macular edema, left eye 12/10/2021   Branch retinal vein occlusion of right eye with macular edema 12/10/2021   Posterior vitreous detachment of both eyes 12/10/2021   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 11/07/2018   Right ventricular failure (HCC) 08/31/2018   Symptomatic anemia 08/26/2018   Atrial fibrillation (HCC) 08/26/2018   Recurrent right pleural effusion 08/26/2018   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) 04/28/2018   S/P lobectomy of lung 04/04/2018   Solitary pulmonary nodule 02/04/2018   Neuropathy 03/18/2017   Disturbance of skin sensation 03/10/2013   Cervical spondylosis with myelopathy 03/10/2013   Abnormality of gait 03/10/2013   Blood glucose elevated 11/18/2012   Arthritis 02/02/2011   Chronic pain 02/02/2011   Fibrositis 02/02/2011   Coronary artery disease    COPD GOLD II if use FEV1/VC ratio     Essential hypertension    Hyperlipidemia    Cigarette smoker    Anxiety and depression    PCP:  Aminta Lamar Hamilton, MD Pharmacy:   [id number] [place], [place] - [address] AT [facility]  OF [facility] [address] [place] [address] Phone: [phone] Fax: [phone]  [facility] [address] [place] [address] Phone: [phone] Fax: [phone]  [facility] - [place], [place] - [address] [place] [address] Phone: [phone] Fax: [phone]     Social Drivers of Health (SDOH) Social History: SDOH Screenings   Food Insecurity: No Food Insecurity (11/04/2023)  Recent Concern: Food Insecurity - Food Insecurity Present (08/11/2023)  Housing: Low Risk  (11/04/2023)  Transportation Needs: No Transportation Needs (11/04/2023)  Utilities: Not At Risk (11/04/2023)  Depression (PHQ2-9): Low Risk  (03/17/2018)  Financial Resource Strain: Low Risk  (05/12/2023)   Received from [facility]  Physical Activity: Unknown (03/10/2023)   Received from [facility]  Social Connections: Moderately Isolated (11/04/2023)  Stress: No Stress Concern Present (03/10/2023)   Received from [facility]  Tobacco Use: High Risk (11/09/2023)   Received from [facility]   SDOH Interventions: Food Insecurity Interventions: Inpatient TOC, Patient Declined   Readmission Risk Interventions    07/06/2023   11:09 AM  Readmission Risk Prevention Plan  Transportation Screening Complete  HRI or Home Care Consult Complete  Social Work Consult for Recovery Care Planning/Counseling Complete  Palliative Care Screening Not Applicable  Medication Review Oceanographer) Referral to Pharmacy

## 2023-11-29 DEATH — deceased

## 2023-12-03 ENCOUNTER — Ambulatory Visit: Admitting: Cardiology

## 2024-02-21 ENCOUNTER — Other Ambulatory Visit (HOSPITAL_BASED_OUTPATIENT_CLINIC_OR_DEPARTMENT_OTHER): Payer: Self-pay
